# Patient Record
Sex: Female | Born: 1940 | Race: White | Hispanic: No | Smoking: Never smoker
Health system: Southern US, Community
[De-identification: ages and names within clinical notes are randomized; demographics above are authoritative.]

## PROBLEM LIST (undated history)

## (undated) DIAGNOSIS — L509 Urticaria, unspecified: Secondary | ICD-10-CM

## (undated) DIAGNOSIS — I10 Essential (primary) hypertension: Secondary | ICD-10-CM

## (undated) DIAGNOSIS — R350 Frequency of micturition: Secondary | ICD-10-CM

## (undated) DIAGNOSIS — G629 Polyneuropathy, unspecified: Secondary | ICD-10-CM

## (undated) DIAGNOSIS — E785 Hyperlipidemia, unspecified: Secondary | ICD-10-CM

## (undated) DIAGNOSIS — K56609 Unspecified intestinal obstruction, unspecified as to partial versus complete obstruction: Secondary | ICD-10-CM

## (undated) DIAGNOSIS — R102 Pelvic and perineal pain: Secondary | ICD-10-CM

## (undated) DIAGNOSIS — K219 Gastro-esophageal reflux disease without esophagitis: Secondary | ICD-10-CM

## (undated) DIAGNOSIS — B009 Herpesviral infection, unspecified: Secondary | ICD-10-CM

## (undated) DIAGNOSIS — R300 Dysuria: Secondary | ICD-10-CM

## (undated) DIAGNOSIS — N390 Urinary tract infection, site not specified: Secondary | ICD-10-CM

## (undated) DIAGNOSIS — A6000 Herpesviral infection of urogenital system, unspecified: Secondary | ICD-10-CM

## (undated) DIAGNOSIS — E119 Type 2 diabetes mellitus without complications: Secondary | ICD-10-CM

## (undated) DIAGNOSIS — G5700 Lesion of sciatic nerve, unspecified lower limb: Secondary | ICD-10-CM

## (undated) DIAGNOSIS — R319 Hematuria, unspecified: Secondary | ICD-10-CM

## (undated) DIAGNOSIS — F419 Anxiety disorder, unspecified: Secondary | ICD-10-CM

## (undated) DIAGNOSIS — R10814 Left lower quadrant abdominal tenderness: Secondary | ICD-10-CM

## (undated) HISTORY — PX: ABDOMINAL HYSTERECTOMY: SHX81

## (undated) HISTORY — DX: Herpesviral infection, unspecified: B00.9

## (undated) HISTORY — PX: COLON SURGERY: SHX602

## (undated) HISTORY — DX: Type 2 diabetes mellitus without complications: E11.9

## (undated) HISTORY — DX: Herpesviral infection of urogenital system, unspecified: A60.00

## (undated) HISTORY — DX: Anxiety disorder, unspecified: F41.9

## (undated) HISTORY — DX: Essential (primary) hypertension: I10

## (undated) HISTORY — DX: Gastro-esophageal reflux disease without esophagitis: K21.9

## (undated) HISTORY — DX: Lesion of sciatic nerve, unspecified lower limb: G57.00

## (undated) HISTORY — DX: Left lower quadrant abdominal tenderness: R10.814

## (undated) HISTORY — DX: Hyperlipidemia, unspecified: E78.5

## (undated) HISTORY — DX: Urinary tract infection, site not specified: N39.0

## (undated) HISTORY — PX: OTHER SURGICAL HISTORY: SHX169

## (undated) HISTORY — DX: Urticaria, unspecified: L50.9

## (undated) HISTORY — DX: Dysuria: R30.0

## (undated) HISTORY — DX: Pelvic and perineal pain: R10.2

## (undated) HISTORY — DX: Hematuria, unspecified: R31.9

## (undated) HISTORY — DX: Frequency of micturition: R35.0

---

## 2001-03-19 ENCOUNTER — Encounter: Payer: Self-pay | Admitting: Family Medicine

## 2001-03-19 ENCOUNTER — Other Ambulatory Visit: Admission: RE | Admit: 2001-03-19 | Discharge: 2001-03-19 | Payer: Self-pay | Admitting: Family Medicine

## 2001-03-19 ENCOUNTER — Ambulatory Visit (HOSPITAL_COMMUNITY): Admission: RE | Admit: 2001-03-19 | Discharge: 2001-03-19 | Payer: Self-pay | Admitting: Family Medicine

## 2001-04-02 ENCOUNTER — Encounter: Payer: Self-pay | Admitting: Emergency Medicine

## 2001-04-03 ENCOUNTER — Inpatient Hospital Stay (HOSPITAL_COMMUNITY): Admission: EM | Admit: 2001-04-03 | Discharge: 2001-04-06 | Payer: Self-pay | Admitting: Emergency Medicine

## 2001-04-05 ENCOUNTER — Encounter: Payer: Self-pay | Admitting: Cardiology

## 2001-07-24 ENCOUNTER — Ambulatory Visit (HOSPITAL_COMMUNITY): Admission: RE | Admit: 2001-07-24 | Discharge: 2001-07-24 | Payer: Self-pay | Admitting: Ophthalmology

## 2002-02-19 ENCOUNTER — Ambulatory Visit (HOSPITAL_COMMUNITY): Admission: RE | Admit: 2002-02-19 | Discharge: 2002-02-19 | Payer: Self-pay | Admitting: Family Medicine

## 2002-02-19 ENCOUNTER — Encounter: Payer: Self-pay | Admitting: Family Medicine

## 2002-08-12 ENCOUNTER — Emergency Department (HOSPITAL_COMMUNITY): Admission: EM | Admit: 2002-08-12 | Discharge: 2002-08-13 | Payer: Self-pay | Admitting: *Deleted

## 2002-08-13 ENCOUNTER — Encounter: Payer: Self-pay | Admitting: *Deleted

## 2003-02-26 ENCOUNTER — Ambulatory Visit (HOSPITAL_COMMUNITY): Admission: RE | Admit: 2003-02-26 | Discharge: 2003-02-26 | Payer: Self-pay | Admitting: Family Medicine

## 2003-02-26 ENCOUNTER — Encounter: Payer: Self-pay | Admitting: Family Medicine

## 2003-06-24 ENCOUNTER — Other Ambulatory Visit: Admission: RE | Admit: 2003-06-24 | Discharge: 2003-06-24 | Payer: Self-pay | Admitting: Family Medicine

## 2003-07-01 ENCOUNTER — Ambulatory Visit (HOSPITAL_COMMUNITY): Admission: RE | Admit: 2003-07-01 | Discharge: 2003-07-01 | Payer: Self-pay | Admitting: Family Medicine

## 2003-07-01 ENCOUNTER — Encounter: Payer: Self-pay | Admitting: Family Medicine

## 2004-12-08 ENCOUNTER — Emergency Department (HOSPITAL_COMMUNITY): Admission: EM | Admit: 2004-12-08 | Discharge: 2004-12-08 | Payer: Self-pay | Admitting: Internal Medicine

## 2005-01-27 ENCOUNTER — Ambulatory Visit (HOSPITAL_COMMUNITY): Admission: RE | Admit: 2005-01-27 | Discharge: 2005-01-27 | Payer: Self-pay | Admitting: Family Medicine

## 2005-03-03 ENCOUNTER — Emergency Department (HOSPITAL_COMMUNITY): Admission: EM | Admit: 2005-03-03 | Discharge: 2005-03-03 | Payer: Self-pay | Admitting: Emergency Medicine

## 2005-11-29 ENCOUNTER — Other Ambulatory Visit: Admission: RE | Admit: 2005-11-29 | Discharge: 2005-11-29 | Payer: Self-pay | Admitting: Family Medicine

## 2006-05-23 ENCOUNTER — Ambulatory Visit (HOSPITAL_COMMUNITY): Admission: RE | Admit: 2006-05-23 | Discharge: 2006-05-23 | Payer: Self-pay | Admitting: Ophthalmology

## 2007-03-28 ENCOUNTER — Emergency Department (HOSPITAL_COMMUNITY): Admission: EM | Admit: 2007-03-28 | Discharge: 2007-03-28 | Payer: Self-pay | Admitting: Emergency Medicine

## 2007-03-29 ENCOUNTER — Inpatient Hospital Stay (HOSPITAL_COMMUNITY): Admission: AD | Admit: 2007-03-29 | Discharge: 2007-04-04 | Payer: Self-pay | Admitting: Emergency Medicine

## 2007-04-05 ENCOUNTER — Emergency Department (HOSPITAL_COMMUNITY): Admission: EM | Admit: 2007-04-05 | Discharge: 2007-04-05 | Payer: Self-pay | Admitting: Emergency Medicine

## 2007-07-03 ENCOUNTER — Ambulatory Visit (HOSPITAL_COMMUNITY): Admission: RE | Admit: 2007-07-03 | Discharge: 2007-07-03 | Payer: Self-pay | Admitting: Family Medicine

## 2007-07-29 ENCOUNTER — Inpatient Hospital Stay (HOSPITAL_COMMUNITY): Admission: EM | Admit: 2007-07-29 | Discharge: 2007-08-01 | Payer: Self-pay | Admitting: Emergency Medicine

## 2007-08-09 ENCOUNTER — Encounter: Payer: Self-pay | Admitting: Orthopedic Surgery

## 2007-08-09 ENCOUNTER — Ambulatory Visit: Payer: Self-pay | Admitting: Orthopedic Surgery

## 2008-04-25 ENCOUNTER — Ambulatory Visit: Payer: Self-pay | Admitting: Gastroenterology

## 2008-05-07 ENCOUNTER — Telehealth: Payer: Self-pay | Admitting: Gastroenterology

## 2008-05-09 ENCOUNTER — Ambulatory Visit: Payer: Self-pay | Admitting: Gastroenterology

## 2008-09-29 ENCOUNTER — Emergency Department (HOSPITAL_COMMUNITY): Admission: EM | Admit: 2008-09-29 | Discharge: 2008-09-29 | Payer: Self-pay | Admitting: Emergency Medicine

## 2008-10-14 ENCOUNTER — Ambulatory Visit (HOSPITAL_COMMUNITY): Admission: RE | Admit: 2008-10-14 | Discharge: 2008-10-14 | Payer: Self-pay | Admitting: Family Medicine

## 2010-05-26 ENCOUNTER — Ambulatory Visit: Payer: Self-pay | Admitting: Vascular Surgery

## 2011-01-02 ENCOUNTER — Encounter: Payer: Self-pay | Admitting: Family Medicine

## 2011-04-26 NOTE — Discharge Summary (Signed)
[name], [name] [name]               ACCOUNT NO.:  [id number]   MEDICAL RECORD NO.:  [id number]          PATIENT TYPE:  INP   LOCATION:  A310                          FACILITY:  [facility]   PHYSICIAN:  Dalia Heading, M.D.  DATE OF BIRTH:  1941/10/23   DATE OF ADMISSION:  07/29/2007  DATE OF DISCHARGE:  08/20/2008LH                               DISCHARGE SUMMARY   AGE:  70 years old.   HOSPITAL COURSE:  The patient is a 70 year old white female status post  lysis of adhesions earlier this year who presented with nausea and  vomiting.  CT scan of the abdomen and pelvis revealed a possible area of  adhesive disease in the distal small bowel in the lower abdomen.  She  was admitted to the hospital for nasogastric tube decompression and  intravenous hydration.  Her bowel obstruction subsequently resolved  without surgical intervention.  Her diet was advanced without  difficulty.  On the day of discharge, she stated that her left hip has  been hurting.  She states this has been hurting for years and would like  to see an orthopedic surgeon.  She will be set up to see Dr. Romeo Apple as  an outpatient.  She will follow up with Dr. Franky Macho in a couple of  weeks.   DISCHARGE INSTRUCTIONS:  The patient is to follow up with Dr. Franky Macho in 2 weeks.   DISCHARGE MEDICATIONS:  1. Aspirin 81 mg p.o. daily.  2. Hydrochlorothiazide 25 mg p.o. daily.  3. Lexapro 10 mg p.o. daily.  4. Aciphex 20 mg p.o. daily.  5. Estradiol 2 mg p.o. daily.  6. Vicodin 1-2 tabs p.o. q.4 h p.r.n. pain.   PRINCIPAL DIAGNOSES:  1. Small bowel obstruction, resolved.  2. Adhesive disease.  3. Anxiety/depression.  4. Mild hypertension.   PRINCIPAL PROCEDURE:  None.      Dalia Heading, M.D.  Electronically Signed     MAJ/MEDQ  D:  08/01/2007  T:  08/02/2007  Job:  [id number]

## 2011-04-26 NOTE — H&P (Signed)
NAME:  [name], [name]               ACCOUNT NO.:  [id number]   MEDICAL RECORD NO.:  [id number]          PATIENT TYPE:  INP   LOCATION:  A310                          FACILITY:  [facility]   PHYSICIAN:  Dalia Heading, M.D.  DATE OF BIRTH:  September 11, 1941   DATE OF ADMISSION:  07/29/2007  DATE OF DISCHARGE:  LH                              HISTORY & PHYSICAL   CHIEF COMPLAINT:  Left-sided abdominal pain and nausea.   HISTORY OF PRESENT ILLNESS:  The patient is a 70 year old white female,  status post multiple abdominal surgeries in the past, who recently  underwent a lysis of adhesions due to a bowel obstruction in April 2008,  who presents with recurrent left-sided abdominal pain, nausea, and  bloating.  Her last bowel movement was earlier today.  She says she has  been passing gas.  She has had intermittent left-sided abdominal pain  over the past 24 hours.  A CT scan of the abdomen and pelvis was  performed in the emergency room which revealed a mid-small bowel  transition point, possibly due to adhesive disease, with air and  contrast noted distal to this.   PAST MEDICAL HISTORY:  1. Gastroesophageal reflux disease.  2. Adhesive disease.  3. Depression.  4. Hypertension.   PAST SURGICAL HISTORY:  1. As noted above.  2. Hysterectomy.  3. Cholecystectomy.   CURRENT MEDICATIONS:  Hydrochlorothiazide, Lexapro, AcipHex, estrogen  supplements, baby aspirin.   ALLERGIES:  No known drug allergies.   REVIEW OF SYSTEMS:  The patient denies drinking or smoking.  The patient  denies any recent chest pain, MI, CVA, or diabetes mellitus.  She denies  any bleeding disorders.   Of note was the fact that the patient did have a postoperative wound  infection which was a Staph infection.   PHYSICAL EXAMINATION:  GENERAL:  The patient is a well-developed, well-  nourished white female in no acute distress.  LUNGS:  Clear to auscultation, with equal breath sounds bilaterally.  HEART:  Reveals a  regular rate and rhythm, without S3, S4, or murmurs.  ABDOMEN:  Soft but distended.  It is not tense.  No tenderness or  rigidity is noted.  RECTAL:  Deferred at this time.   MET-7 is remarkable for a potassium of 3.4, BUN 20, creatinine 0.89.  White blood cell count 13.6, hematocrit 40, platelet count 186.  Liver  enzyme tests were within normal limits.   IMPRESSION:  Partial small-bowel obstruction secondary to adhesive  disease.   PLAN:  The patient will be admitted to the hospital for nasogastric tube  decompression and relief of her nausea and vomiting.  A nasogastric tube  has been placed.  Hopefully, this will resolve her partial obstruction.  Ideally, I would like to avoid surgery on her, as she has a difficult  time in the postoperative period healing.      Dalia Heading, M.D.  Electronically Signed     MAJ/MEDQ  D:  07/29/2007  T:  07/30/2007  Job:  [id number]

## 2011-04-26 NOTE — Assessment & Plan Note (Signed)
OFFICE VISIT   Silvey, Brentlee S  DOB:  September 10, 1941                                       05/26/2010  ZOXWR#:[id number]   The patient is a 70 year old female referred by Belva Agee for  evaluation of bilateral lower extremity leg pain.  The patient describes  a burning and tingling sensation from the knees down bilaterally.  She  states it has been going on for 2-3 years.  She checks her glucose at  home intermittently since her husband has diabetes.  She states that she  usually runs between 120 and 130.  She states that the pain in her feet  is worse with walking.  She says sometimes the feet feel swollen but  she looks down and notices that they are not.  She denies alcohol or  tobacco abuse.  She currently is taking Tylenol #3 which helps the pain  somewhat.  She says also she has used Aspercreme in the past with some  relief.  She denies any trauma to the lower extremities.   Chronic medical problems include hypertension, reflux, depression.  These are all currently stable and followed by Belva Agee.  She is  currently taking an aspirin daily.   Past medical history is otherwise fairly unremarkable.   PAST SURGICAL HISTORY:  She had some type of intestinal blockage  repaired.  She had a cholecystectomy.  She had a cervical spine  operation.   FAMILY HISTORY:  Remarkable for her brothers, both who had vascular  disease at age 76 and died by age 60.   SOCIAL HISTORY:  She is married.  She has 3 children.  She is a remote  smoker but has not smoked in greater than 20 years.  She does not  consume alcohol regularly.   Full 12-point review of systems was performed with the patient today.  Please see intake referral form for details regarding this.   PHYSICAL EXAM:  Blood pressure 119/77 in the left arm, temperature is  97.8, heart rate 61 and regular.  HEENT:  Unremarkable.  Neck has 2+  carotid pulses without bruit.  Chest is clear to auscultation.   Heart  exam is regular rate and rhythm without murmur.  Abdomen is obese, soft,  nontender, nondistended, with well-healed midline, paramedian, and right  lower quadrant scars.  Lower extremities have no significant major joint  deformities.  There is no significant edema.  Neurologic exam shows  symmetric upper extremity and lower extremity motor strength which is  5/5.  Sensation is intact in the feet and hands.  Skin has no open  ulcers or rashes.  Lower extremity vascular exam:  She has 1+ femoral  pulses.  She has 1+ dorsalis pedis and posterior tibial pulses  bilaterally.  Feet are pale in color with some dry skin but no open  ulcerations.  There is no significant edema.   She had bilateral ABIs today which were normal bilaterally with  triphasic wave forms.  ABI on the right was 1.23; on the left, it was  1.19.   In summary, the patient has bilateral lower extremity leg pain.  Her  symptoms sound more consistent with neuropathy to me rather than  arterial or venous disease.  I reassured her today and offered her a  trial of Neurontin to see if this would improve her symptoms.  However,  she states that she would prefer to stay with the Tylenol 3 for now.  This could certainly be something that Belva Agee could consider in the  future if she has persistent symptoms.  She does not need further  followup with me as far as her vascular evaluation is concerned.  She  could certainly feel free to follow up with me on an as-needed basis if  she has a new problems.     Janetta Hora. Fields, MD  Electronically Signed   CEF/MEDQ  D:  05/26/2010  T:  05/27/2010  Job:  [id number]   cc:   [facility] Belva Agee, NP

## 2011-04-29 NOTE — Discharge Summary (Signed)
NAME:  [name] [name], [name] [name]               ACCOUNT NO.:  [id number]   MEDICAL RECORD NO.:  [id number]          PATIENT TYPE:  INP   LOCATION:  A327                          FACILITY:  [facility]   PHYSICIAN:  Dalia Heading, M.D.  DATE OF BIRTH:  1941/12/05   DATE OF ADMISSION:  03/29/2007  DATE OF DISCHARGE:  04/23/2008LH                               DISCHARGE SUMMARY   HOSPITAL COURSE SUMMARY:  The patient is a 70 year old white female who  was admitted to the hospital for workup of nausea, vomiting, abdominal  distension.  A CT scan of the abdomen and pelvis revealed a small bowel  obstruction.  She subsequently went to the operating room on Apr 29, 2007 and underwent exploratory laparotomy with lysis of adhesions.  Her  bowel obstruction was secondary to previous abdominal surgeries.  She  tolerated the procedure well.  Postoperative course was for the most  part unremarkable.  Her diet was advanced without difficulty once her  bowel function returned.   The patient is being discharged home on April 04, 2007 in good improving  condition.   DISCHARGE INSTRUCTIONS:  The patient is to follow up with Dr. Franky Macho on April 10, 2007.   DISCHARGE MEDICATIONS:  1. Vicodin 1-2 tablets p.o. q.4 h. p.r.n. pain.  2. Hormone pill 2 mg p.o. daily.  3. Lexapro 10 mg p.o. daily.   PRINCIPAL DIAGNOSES:  1. Small bowel obstruction.  2. Gastroesophageal reflux disease.  3. Depression.  4. Hypertension.   PRINCIPAL PROCEDURES:  Exploratory laparotomy, lysis of adhesions on  March 30, 2007.      Dalia Heading, M.D.  Electronically Signed     MAJ/MEDQ  D:  04/04/2007  T:  04/04/2007  Job:  [id number]

## 2011-04-29 NOTE — H&P (Signed)
NAME:  [name] [name], [name] [name]               ACCOUNT NO.:  [id number]   MEDICAL RECORD NO.:  [id number]          PATIENT TYPE:  OBV   LOCATION:  A327                          FACILITY:  [facility]   PHYSICIAN:  Dalia Heading, M.D.  DATE OF BIRTH:  1941/11/18   DATE OF ADMISSION:  03/29/2007  DATE OF DISCHARGE:  LH                              HISTORY & PHYSICAL   CHIEF COMPLAINT:  Left-sided abdominal pain and nausea.   HISTORY OF PRESENT ILLNESS:  The patient is a 70 year old white female  status post multiple abdominal surgeries in the past, who now presents  with worsening left-sided abdominal pain, nausea and bloating.  She last  had a bowel movement yesterday morning.  She was seen in the emergency  room yesterday evening and was told to follow up with her primary care  physician for her abdominal pain.  An acute abdominal series revealed  slightly prominent nonspecific small bowel loops in the left mid  abdomen.  Since she was sent home she has worsened and now presents to  my office for further evaluation and treatment.   PAST MEDICAL HISTORY:  1. Gastroesophageal reflux disease.  2. Depression.  3. Hypertension.   PAST SURGICAL HISTORY:  1. Hysterectomy.  2. Cholecystectomy.   CURRENT MEDICATIONS:  1. Baby aspirin.  2. Hydrochlorothiazide.  3. Lexapro.  4. Aciphex.  5. Estrogen supplement.   ALLERGIES:  NO KNOWN DRUG ALLERGIES.   SOCIAL HISTORY:  Patient does not drink or smoke.   REVIEW OF SYSTEMS:  Denies any recent chest pain, MI, CVA or diabetes  mellitus.  Denies any bleeding disorders.   PHYSICAL EXAMINATION:  GENERAL:  The patient is a well-developed, well-  nourished white female who is slightly anxious.  HEENT:  Examination reveals no scleral icterus.  LUNGS:  Clear to auscultation with equal breath sounds bilaterally.  HEART:  Reveals regular rate and rhythm without S3, S4 or murmurs.  ABDOMEN:  Soft but distended.  It is not tense.  She is tender diffusely  across the mid portion of her abdomen; no rigidity is noted.  RECTAL:  Examination is deferred at this time.   LABORATORY DATA:  MET7 is within normal limits.  Urinalysis is negative.  White blood cell count is noted to be 15.4, hematocrit is 41 and  platelet count is 192.  Liver profile is within normal limits.   IMPRESSION:  Abdominal pain of unknown etiology, question small bowel  obstruction.   PLAN:  The patient will be admitted to the hospital for pain control and  workup of her abdominal pain.  CT scan of the abdomen and pelvis with  and without contrast has been ordered.  Further management is pending  those results.      Dalia Heading, M.D.  Electronically Signed     MAJ/MEDQ  D:  03/29/2007  T:  03/29/2007  Job:  [id number]

## 2011-04-29 NOTE — Op Note (Signed)
NAME:  [name] [name], [name] [name]               ACCOUNT NO.:  [id number]   MEDICAL RECORD NO.:  [id number]          PATIENT TYPE:  INP   LOCATION:  A327                          FACILITY:  [facility]   PHYSICIAN:  Dalia Heading, M.D.  DATE OF BIRTH:  [birth year]   DATE OF PROCEDURE:  03/30/2007  DATE OF DISCHARGE:                               OPERATIVE REPORT   PREOPERATIVE DIAGNOSIS:  Small-bowel obstruction.   POSTOPERATIVE DIAGNOSIS:  Small-bowel obstruction.   PROCEDURE:  Exploratory laparotomy, lysis of adhesions.   SURGEON:  Dr. Franky Macho.   ANESTHESIA:  General endotracheal.   INDICATIONS:  The patient is a 70 year old white female who presents  with nausea, emesis, and abdominal pain.  CT scan of the abdomen and  pelvis was performed which revealed a small-bowel obstruction in the mid  portion.  This presumably is secondary to adhesive disease as the  patient has had multiple abdominal surgeries in the past.  The risks and  benefits of the procedure including bleeding, infection, recurrence of  the disease, the possibility of bowel resection were fully explained to  the patient, gave informed consent.   PROCEDURE NOTE:  The patient was placed in supine position.  After  induction of general endotracheal anesthesia, the abdomen was prepped  and draped using the usual sterile technique with Betadine.  Surgical  site confirmation was performed.   A right paramedian incision was made as the patient has had multiple  midline incisions in the past.  The peritoneal cavity was entered into  without difficulty.  There were multiple adhesions of small bowel up  alongside the abdominal wall close to the incision and into the pelvis.  These were fully lysed.  There was one segment of bowel that appeared to  be kinked and when this was lysed, the obstruction was seen to be  resolved.  The bowel was then ran from the proximal small intestine to  the terminal ileum.  No critical adhesions were  found at the end of the  procedure.  No abnormal bleeding was noted at the end of the procedure.  The nasogastric tube was noted be in the appropriate position in the  stomach.  The bowel was returned into the abdominal cavity in an orderly  fashion.  The fascia was reapproximated using and 0 PDS running suture.  The subcutaneous layer was irrigated with normal saline.  The  subcutaneous layer was also closed with 2-0 and 3-0 Vicryl interrupted  sutures.  The skin was closed using staples.  Betadine ointment and dry  sterile dressings were applied.   All tech needle counts were correct and the end of the procedure.  The  patient was extubated in the operating room and went back to recovery  room awake and in stable condition.   COMPLICATIONS:  None.   SPECIMEN:  None.   BLOOD LOSS:  Minimal.      Dalia Heading, M.D.  Electronically Signed     MAJ/MEDQ  D:  03/30/2007  T:  03/30/2007  Job:  [id number]

## 2011-09-12 LAB — CBC
Hemoglobin: 13.7
Platelets: 161
RDW: 12.9
WBC: 11.9 — ABNORMAL HIGH

## 2011-09-12 LAB — POCT I-STAT, CHEM 8
BUN: 18
Chloride: 97
Sodium: 136

## 2011-09-12 LAB — DIFFERENTIAL
Basophils Absolute: 0
Lymphocytes Relative: 22
Monocytes Absolute: 0.6
Neutro Abs: 8.4 — ABNORMAL HIGH

## 2011-09-12 LAB — D-DIMER, QUANTITATIVE: D-Dimer, Quant: 0.52 — ABNORMAL HIGH

## 2011-09-12 LAB — POCT CARDIAC MARKERS
CKMB, poc: <1 — ABNORMAL LOW
Myoglobin, poc: 47.3
Myoglobin, poc: 48.2

## 2011-09-23 LAB — URINALYSIS, ROUTINE W REFLEX MICROSCOPIC
Bilirubin Urine: NEGATIVE
Nitrite: NEGATIVE
Specific Gravity, Urine: >1.03 — ABNORMAL HIGH
pH: 5.5

## 2011-09-23 LAB — DIFFERENTIAL
Basophils Relative: 1
Eosinophils Absolute: 0.2
Eosinophils Relative: 2
Lymphocytes Relative: 15
Lymphs Abs: 1.9
Lymphs Abs: 2
Neutro Abs: 9.2 — ABNORMAL HIGH
Neutrophils Relative %: 74

## 2011-09-23 LAB — COMPREHENSIVE METABOLIC PANEL
AST: 22
Albumin: 3.5
Calcium: 9
Creatinine, Ser: 0.89
GFR calc Af Amer: >60
GFR calc non Af Amer: >60

## 2011-09-23 LAB — CBC
MCHC: 34.1
MCV: 86.4
MCV: 87.5
Platelets: 164
Platelets: 186
WBC: 12.4 — ABNORMAL HIGH

## 2011-09-23 LAB — BASIC METABOLIC PANEL
BUN: 8
Calcium: 8.5
Chloride: 105
Creatinine, Ser: 0.65
GFR calc Af Amer: >60

## 2011-09-23 LAB — LIPASE, BLOOD: Lipase: 27

## 2014-07-22 ENCOUNTER — Encounter: Payer: Self-pay | Admitting: Gastroenterology

## 2015-12-22 ENCOUNTER — Encounter: Payer: Self-pay | Admitting: Adult Health

## 2015-12-22 ENCOUNTER — Ambulatory Visit (INDEPENDENT_AMBULATORY_CARE_PROVIDER_SITE_OTHER): Payer: Medicare HMO | Admitting: Adult Health

## 2015-12-22 VITALS — BP 132/80 | HR 60 | Ht 58.5 in | Wt 184.0 lb

## 2015-12-22 DIAGNOSIS — B009 Herpesviral infection, unspecified: Secondary | ICD-10-CM | POA: Diagnosis not present

## 2015-12-22 HISTORY — DX: Herpesviral infection, unspecified: B00.9

## 2015-12-22 MED ORDER — VALACYCLOVIR HCL 1 G PO TABS: ORAL_TABLET | ORAL | Status: DC

## 2015-12-22 NOTE — Progress Notes (Signed)
Subjective:     Patient ID: [name] [name], female   DOB: Nov 01, 1941, 75 y.o.   MRN: [id number]  HPI [name] [name] is a [age] yea old white female in complaining of spot on buttock for about 2 -3 weeks itches and burns, has history of herpes in past, daughter is with her.She has moved in with daughter and [place] [facility] [place] [facility], has seen Dr Clelia CroftShaw and Dr Mora ApplMcLeod in past. Has to take miralax for bowels.  Review of Systems Patient denies any headaches, hearing loss, fatigue, blurred vision, shortness of breath, chest pain, abdominal pain, problems with  urination, or intercourse(not having sex). No joint pain or mood swings.See HPI for positives. Reviewed past medical,surgical, social and family history. Reviewed medications and allergies.     Objective:   Physical Exam BP 132/80 mmHg  Pulse 60  Ht 4' 10.5" (1.486 m)  Wt 184 lb (83.462 kg)  BMI 37.80 kg/m2   Has 1.5 x 1.5 cm area inner right buttock, that looks like ruptured vesicles, and has redness between buttocks like diaper rash.  Assessment:     Herpes     Plan:     Rx valtrex 1 gm #30 take 1 bid x 5 days then 1 daily with 11 refills Use desitin and vaseline in between buttocks Follow up in 2 weeks to make sure is healing

## 2015-12-22 NOTE — Patient Instructions (Signed)
Take valtrex  Use desitin and vaseline Follow up in 2 weeks

## 2016-01-04 ENCOUNTER — Ambulatory Visit (INDEPENDENT_AMBULATORY_CARE_PROVIDER_SITE_OTHER): Payer: Medicare HMO | Admitting: Adult Health

## 2016-01-04 ENCOUNTER — Encounter: Payer: Self-pay | Admitting: Adult Health

## 2016-01-04 VITALS — BP 140/80 | HR 74 | Ht 58.5 in | Wt 187.0 lb

## 2016-01-04 DIAGNOSIS — R10814 Left lower quadrant abdominal tenderness: Secondary | ICD-10-CM

## 2016-01-04 DIAGNOSIS — N9489 Other specified conditions associated with female genital organs and menstrual cycle: Secondary | ICD-10-CM | POA: Diagnosis not present

## 2016-01-04 DIAGNOSIS — R319 Hematuria, unspecified: Secondary | ICD-10-CM | POA: Diagnosis not present

## 2016-01-04 DIAGNOSIS — R300 Dysuria: Secondary | ICD-10-CM

## 2016-01-04 DIAGNOSIS — B009 Herpesviral infection, unspecified: Secondary | ICD-10-CM

## 2016-01-04 DIAGNOSIS — R350 Frequency of micturition: Secondary | ICD-10-CM | POA: Diagnosis not present

## 2016-01-04 DIAGNOSIS — Z1389 Encounter for screening for other disorder: Secondary | ICD-10-CM | POA: Diagnosis not present

## 2016-01-04 DIAGNOSIS — R102 Pelvic and perineal pain: Secondary | ICD-10-CM | POA: Insufficient documentation

## 2016-01-04 HISTORY — DX: Pelvic and perineal pain: R10.2

## 2016-01-04 HISTORY — DX: Dysuria: R30.0

## 2016-01-04 HISTORY — DX: Left lower quadrant abdominal tenderness: R10.814

## 2016-01-04 HISTORY — DX: Hematuria, unspecified: R31.9

## 2016-01-04 HISTORY — DX: Frequency of micturition: R35.0

## 2016-01-04 LAB — POCT URINALYSIS DIPSTICK
Leukocytes, UA: NEGATIVE
Nitrite, UA: NEGATIVE
Protein, UA: NEGATIVE

## 2016-01-04 NOTE — Patient Instructions (Addendum)
Return in 7 days for [place] Will send Urine for culture

## 2016-01-04 NOTE — Progress Notes (Signed)
Subjective:     Patient ID: Scharlene Corn, female   DOB: [birth year], 75 y.o.   MRN: [id number]  HPI Daniqua is a 75 year old white female, in for follow up of recent herpes on buttock.Complains of urinary frequency and burning at times and has pressure in bottom of stomach and some hot flashes.  Review of Systems Patient denies any headaches, hearing loss, fatigue, blurred vision, shortness of breath, chest pain,  problems with bowel movements, or intercourse(not having sex). No joint pain or mood swings.See HPI for positives. Reviewed past medical,surgical, social and family history. Reviewed medications and allergies.     Objective:   Physical Exam BP 140/80 mmHg  Pulse 74  Ht 4' 10.5" (1.486 m)  Wt 187 lb (84.823 kg)  BMI 38.41 kg/m2urine trace blood and 1+ glucose, Skin warm and dry.Pelvic: external genitalia is normal in appearance no lesions, vagina: atrophic,urethra has no lesions or masses noted, cervix and uterus are absent. adnexa: no masses or tenderness noted. Bladder is non tender and no masses felt.Redness and raw spot in buttocks resolved.Would not start her on ET, some hot flashes maybe from meds.Will get culture on urine and [place].Continue current meds.   Spoke with son and sent note to daughter.  Assessment:    Urinary frequency Burning with urination Herpes resolved Pelvic pressure LLQ tenderness Hematuria     Plan:     UA C&S  Return in 1 week for gyn [place] Will talk when urine back

## 2016-01-05 ENCOUNTER — Ambulatory Visit: Payer: Medicare HMO | Admitting: Adult Health

## 2016-01-05 LAB — URINALYSIS, ROUTINE W REFLEX MICROSCOPIC
BILIRUBIN UA: NEGATIVE
KETONES UA: NEGATIVE
LEUKOCYTES UA: NEGATIVE
NITRITE UA: NEGATIVE
Protein, UA: NEGATIVE
RBC UA: NEGATIVE
SPEC GRAV UA: 1.016 (ref 1.005–1.030)
Urobilinogen, Ur: 0.2 mg/dL (ref 0.2–1.0)
pH, UA: 5.5 (ref 5.0–7.5)

## 2016-01-06 ENCOUNTER — Telehealth: Payer: Self-pay | Admitting: Adult Health

## 2016-01-06 LAB — URINE CULTURE

## 2016-01-06 NOTE — Telephone Encounter (Signed)
Number disconnected.

## 2016-01-11 ENCOUNTER — Ambulatory Visit (INDEPENDENT_AMBULATORY_CARE_PROVIDER_SITE_OTHER): Payer: Medicare HMO

## 2016-01-11 DIAGNOSIS — N9489 Other specified conditions associated with female genital organs and menstrual cycle: Secondary | ICD-10-CM

## 2016-01-11 DIAGNOSIS — R102 Pelvic and perineal pain: Secondary | ICD-10-CM

## 2016-01-11 DIAGNOSIS — R10814 Left lower quadrant abdominal tenderness: Secondary | ICD-10-CM

## 2016-01-11 NOTE — Progress Notes (Signed)
PELVIC US TA/TV: normal vag cuff,no adnexal masses or free fluid seen,no pain during ultrasound

## 2016-01-13 ENCOUNTER — Telehealth: Payer: Self-pay | Admitting: Adult Health

## 2016-01-13 NOTE — Telephone Encounter (Signed)
Left message to call about US  

## 2016-01-15 ENCOUNTER — Telehealth: Payer: Self-pay | Admitting: Adult Health

## 2016-01-15 NOTE — Telephone Encounter (Signed)
Left message to call back  

## 2016-01-15 NOTE — Telephone Encounter (Signed)
Patty aware [place] normal, [place] is getting hip checked now

## 2016-01-18 ENCOUNTER — Other Ambulatory Visit (HOSPITAL_COMMUNITY): Payer: Self-pay | Admitting: Internal Medicine

## 2016-01-18 ENCOUNTER — Ambulatory Visit (HOSPITAL_COMMUNITY)
Admission: RE | Admit: 2016-01-18 | Discharge: 2016-01-18 | Disposition: A | Discharge status: Home / Self Care | Payer: Medicare HMO | Source: Ambulatory Visit | Attending: Internal Medicine | Admitting: Internal Medicine

## 2016-01-18 DIAGNOSIS — E119 Type 2 diabetes mellitus without complications: Secondary | ICD-10-CM | POA: Insufficient documentation

## 2016-01-18 DIAGNOSIS — R102 Pelvic and perineal pain: Secondary | ICD-10-CM

## 2016-01-18 DIAGNOSIS — M25552 Pain in left hip: Secondary | ICD-10-CM | POA: Diagnosis not present

## 2016-01-18 DIAGNOSIS — I10 Essential (primary) hypertension: Secondary | ICD-10-CM | POA: Insufficient documentation

## 2016-02-01 ENCOUNTER — Ambulatory Visit (HOSPITAL_COMMUNITY)
Admission: RE | Admit: 2016-02-01 | Discharge: 2016-02-01 | Disposition: A | Discharge status: Home / Self Care | Payer: Medicare HMO | Source: Ambulatory Visit | Attending: Internal Medicine | Admitting: Internal Medicine

## 2016-02-01 ENCOUNTER — Other Ambulatory Visit (HOSPITAL_COMMUNITY): Payer: Self-pay | Admitting: Internal Medicine

## 2016-02-01 DIAGNOSIS — Z833 Family history of diabetes mellitus: Secondary | ICD-10-CM

## 2016-02-01 DIAGNOSIS — J101 Influenza due to other identified influenza virus with other respiratory manifestations: Principal | ICD-10-CM | POA: Diagnosis present

## 2016-02-01 DIAGNOSIS — I119 Hypertensive heart disease without heart failure: Secondary | ICD-10-CM | POA: Diagnosis present

## 2016-02-01 DIAGNOSIS — Z6836 Body mass index (BMI) 36.0-36.9, adult: Secondary | ICD-10-CM

## 2016-02-01 DIAGNOSIS — G9340 Encephalopathy, unspecified: Secondary | ICD-10-CM | POA: Diagnosis present

## 2016-02-01 DIAGNOSIS — Z87440 Personal history of urinary (tract) infections: Secondary | ICD-10-CM

## 2016-02-01 DIAGNOSIS — R059 Cough, unspecified: Secondary | ICD-10-CM

## 2016-02-01 DIAGNOSIS — M47814 Spondylosis without myelopathy or radiculopathy, thoracic region: Secondary | ICD-10-CM

## 2016-02-01 DIAGNOSIS — F17290 Nicotine dependence, other tobacco product, uncomplicated: Secondary | ICD-10-CM | POA: Diagnosis present

## 2016-02-01 DIAGNOSIS — E785 Hyperlipidemia, unspecified: Secondary | ICD-10-CM | POA: Diagnosis present

## 2016-02-01 DIAGNOSIS — E860 Dehydration: Secondary | ICD-10-CM | POA: Diagnosis present

## 2016-02-01 DIAGNOSIS — Z818 Family history of other mental and behavioral disorders: Secondary | ICD-10-CM

## 2016-02-01 DIAGNOSIS — E119 Type 2 diabetes mellitus without complications: Secondary | ICD-10-CM | POA: Diagnosis present

## 2016-02-01 DIAGNOSIS — Z820 Family history of epilepsy and other diseases of the nervous system: Secondary | ICD-10-CM

## 2016-02-01 DIAGNOSIS — R05 Cough: Secondary | ICD-10-CM

## 2016-02-01 DIAGNOSIS — M47816 Spondylosis without myelopathy or radiculopathy, lumbar region: Secondary | ICD-10-CM | POA: Insufficient documentation

## 2016-02-01 DIAGNOSIS — A084 Viral intestinal infection, unspecified: Secondary | ICD-10-CM | POA: Diagnosis present

## 2016-02-01 DIAGNOSIS — Z8249 Family history of ischemic heart disease and other diseases of the circulatory system: Secondary | ICD-10-CM

## 2016-02-01 DIAGNOSIS — E669 Obesity, unspecified: Secondary | ICD-10-CM | POA: Diagnosis present

## 2016-02-01 DIAGNOSIS — J209 Acute bronchitis, unspecified: Secondary | ICD-10-CM | POA: Diagnosis present

## 2016-02-01 DIAGNOSIS — R531 Weakness: Secondary | ICD-10-CM | POA: Diagnosis not present

## 2016-02-01 DIAGNOSIS — K219 Gastro-esophageal reflux disease without esophagitis: Secondary | ICD-10-CM | POA: Diagnosis present

## 2016-02-03 ENCOUNTER — Encounter (HOSPITAL_COMMUNITY): Payer: Self-pay | Admitting: Emergency Medicine

## 2016-02-03 ENCOUNTER — Emergency Department (HOSPITAL_COMMUNITY): Payer: Medicare HMO

## 2016-02-03 ENCOUNTER — Inpatient Hospital Stay (HOSPITAL_COMMUNITY)
Admission: EM | Admit: 2016-02-03 | Discharge: 2016-02-05 | DRG: 193 | Disposition: A | Discharge status: Home Health | Payer: Medicare HMO | Attending: Internal Medicine | Admitting: Internal Medicine

## 2016-02-03 DIAGNOSIS — J101 Influenza due to other identified influenza virus with other respiratory manifestations: Secondary | ICD-10-CM | POA: Diagnosis present

## 2016-02-03 DIAGNOSIS — Z8249 Family history of ischemic heart disease and other diseases of the circulatory system: Secondary | ICD-10-CM | POA: Diagnosis not present

## 2016-02-03 DIAGNOSIS — E119 Type 2 diabetes mellitus without complications: Secondary | ICD-10-CM | POA: Diagnosis present

## 2016-02-03 DIAGNOSIS — G9340 Encephalopathy, unspecified: Secondary | ICD-10-CM | POA: Diagnosis present

## 2016-02-03 DIAGNOSIS — K219 Gastro-esophageal reflux disease without esophagitis: Secondary | ICD-10-CM | POA: Diagnosis present

## 2016-02-03 DIAGNOSIS — B349 Viral infection, unspecified: Secondary | ICD-10-CM

## 2016-02-03 DIAGNOSIS — E860 Dehydration: Secondary | ICD-10-CM | POA: Diagnosis present

## 2016-02-03 DIAGNOSIS — J208 Acute bronchitis due to other specified organisms: Secondary | ICD-10-CM | POA: Diagnosis not present

## 2016-02-03 DIAGNOSIS — R531 Weakness: Secondary | ICD-10-CM

## 2016-02-03 DIAGNOSIS — Z818 Family history of other mental and behavioral disorders: Secondary | ICD-10-CM | POA: Diagnosis not present

## 2016-02-03 DIAGNOSIS — F17290 Nicotine dependence, other tobacco product, uncomplicated: Secondary | ICD-10-CM | POA: Diagnosis present

## 2016-02-03 DIAGNOSIS — Z87440 Personal history of urinary (tract) infections: Secondary | ICD-10-CM | POA: Diagnosis not present

## 2016-02-03 DIAGNOSIS — E1159 Type 2 diabetes mellitus with other circulatory complications: Secondary | ICD-10-CM

## 2016-02-03 DIAGNOSIS — E785 Hyperlipidemia, unspecified: Secondary | ICD-10-CM | POA: Diagnosis present

## 2016-02-03 DIAGNOSIS — I119 Hypertensive heart disease without heart failure: Secondary | ICD-10-CM | POA: Diagnosis present

## 2016-02-03 DIAGNOSIS — J209 Acute bronchitis, unspecified: Secondary | ICD-10-CM

## 2016-02-03 DIAGNOSIS — Z820 Family history of epilepsy and other diseases of the nervous system: Secondary | ICD-10-CM | POA: Diagnosis not present

## 2016-02-03 DIAGNOSIS — E669 Obesity, unspecified: Secondary | ICD-10-CM | POA: Diagnosis present

## 2016-02-03 DIAGNOSIS — A084 Viral intestinal infection, unspecified: Secondary | ICD-10-CM | POA: Diagnosis present

## 2016-02-03 DIAGNOSIS — Z6836 Body mass index (BMI) 36.0-36.9, adult: Secondary | ICD-10-CM | POA: Diagnosis not present

## 2016-02-03 DIAGNOSIS — I10 Essential (primary) hypertension: Secondary | ICD-10-CM | POA: Diagnosis not present

## 2016-02-03 DIAGNOSIS — Z833 Family history of diabetes mellitus: Secondary | ICD-10-CM | POA: Diagnosis not present

## 2016-02-03 LAB — BASIC METABOLIC PANEL
Anion gap: 10 (ref 5–15)
BUN: 20 mg/dL (ref 6–20)
CHLORIDE: 100 mmol/L — AB (ref 101–111)
CO2: 27 mmol/L (ref 22–32)
CREATININE: 1.22 mg/dL — AB (ref 0.44–1.00)
Calcium: 9.2 mg/dL (ref 8.9–10.3)
GFR calc Af Amer: 49 mL/min — ABNORMAL LOW (ref >60)
GFR calc non Af Amer: 43 mL/min — ABNORMAL LOW (ref >60)
Glucose, Bld: 164 mg/dL — ABNORMAL HIGH (ref 65–99)
Potassium: 4.3 mmol/L (ref 3.5–5.1)
Sodium: 137 mmol/L (ref 135–145)

## 2016-02-03 LAB — HEPATIC FUNCTION PANEL
ALBUMIN: 3.5 g/dL (ref 3.5–5.0)
ALT: 19 U/L (ref 14–54)
AST: 24 U/L (ref 15–41)
Alkaline Phosphatase: 45 U/L (ref 38–126)
Bilirubin, Direct: 0.1 mg/dL (ref 0.1–0.5)
Indirect Bilirubin: 0.2 mg/dL — ABNORMAL LOW (ref 0.3–0.9)
TOTAL PROTEIN: 6.5 g/dL (ref 6.5–8.1)
Total Bilirubin: 0.3 mg/dL (ref 0.3–1.2)

## 2016-02-03 LAB — CBC WITH DIFFERENTIAL/PLATELET
Basophils Absolute: 0.1 10*3/uL (ref 0.0–0.1)
Basophils Relative: 1 %
EOS ABS: 0.1 10*3/uL (ref 0.0–0.7)
Eosinophils Relative: 1 %
HEMATOCRIT: 43.9 % (ref 36.0–46.0)
HEMOGLOBIN: 14.4 g/dL (ref 12.0–15.0)
LYMPHS ABS: 0.5 10*3/uL — AB (ref 0.7–4.0)
Lymphocytes Relative: 4 %
MCH: 31 pg (ref 26.0–34.0)
MCHC: 32.8 g/dL (ref 30.0–36.0)
MCV: 94.4 fL (ref 78.0–100.0)
MONOS PCT: 15 %
Monocytes Absolute: 1.9 10*3/uL — ABNORMAL HIGH (ref 0.1–1.0)
NEUTROS ABS: 9.8 10*3/uL — AB (ref 1.7–7.7)
NEUTROS PCT: 79 %
Platelets: 159 10*3/uL (ref 150–400)
RBC: 4.65 MIL/uL (ref 3.87–5.11)
RDW: 14.5 % (ref 11.5–15.5)
WBC: 12.3 10*3/uL — ABNORMAL HIGH (ref 4.0–10.5)

## 2016-02-03 LAB — URINALYSIS, ROUTINE W REFLEX MICROSCOPIC
Bilirubin Urine: NEGATIVE
GLUCOSE, UA: NEGATIVE mg/dL
Ketones, ur: NEGATIVE mg/dL
Nitrite: NEGATIVE
PH: 5 (ref 5.0–8.0)
Protein, ur: NEGATIVE mg/dL
Specific Gravity, Urine: >1.03 — ABNORMAL HIGH (ref 1.005–1.030)

## 2016-02-03 LAB — GLUCOSE, CAPILLARY: GLUCOSE-CAPILLARY: 102 mg/dL — AB (ref 65–99)

## 2016-02-03 LAB — INFLUENZA PANEL BY PCR (TYPE A & B)
H1N1FLUPCR: NOT DETECTED
INFLAPCR: POSITIVE — AB
INFLBPCR: NEGATIVE

## 2016-02-03 LAB — CBG MONITORING, ED: Glucose-Capillary: 163 mg/dL — ABNORMAL HIGH (ref 65–99)

## 2016-02-03 LAB — LACTIC ACID, PLASMA
Lactic Acid, Venous: 2.1 mmol/L (ref 0.5–2.0)
Lactic Acid, Venous: 1.9 mmol/L (ref 0.5–2.0)

## 2016-02-03 LAB — URINE MICROSCOPIC-ADD ON

## 2016-02-03 LAB — TROPONIN I: Troponin I: <0.03 ng/mL (ref <0.031)

## 2016-02-03 LAB — MRSA PCR SCREENING: MRSA BY PCR: POSITIVE — AB

## 2016-02-03 MED ORDER — CITALOPRAM HYDROBROMIDE 20 MG PO TABS
40.0000 mg | ORAL_TABLET | Freq: Every day | ORAL | Status: DC
  Administered 2016-02-03 – 2016-02-05 (×2): 40 mg via ORAL
  Filled 2016-02-03 (×3): qty 2

## 2016-02-03 MED ORDER — ENOXAPARIN SODIUM 40 MG/0.4ML ~~LOC~~ SOLN
40.0000 mg | SUBCUTANEOUS | Status: DC
  Administered 2016-02-03 – 2016-02-04 (×2): 40 mg via SUBCUTANEOUS
  Filled 2016-02-03 (×2): qty 0.4

## 2016-02-03 MED ORDER — ACETAMINOPHEN 650 MG RE SUPP: 650.0000 mg | Freq: Four times a day (QID) | RECTAL | Status: DC | PRN

## 2016-02-03 MED ORDER — OSELTAMIVIR PHOSPHATE 75 MG PO CAPS: 75.0000 mg | ORAL_CAPSULE | Freq: Two times a day (BID) | ORAL | Status: DC

## 2016-02-03 MED ORDER — SODIUM CHLORIDE 0.9 % IV SOLN
INTRAVENOUS | Status: AC
  Filled 2016-02-03: qty 1000

## 2016-02-03 MED ORDER — CHLORHEXIDINE GLUCONATE CLOTH 2 % EX PADS
6.0000 | MEDICATED_PAD | Freq: Every day | CUTANEOUS | Status: DC
  Administered 2016-02-05: 6 via TOPICAL

## 2016-02-03 MED ORDER — INSULIN ASPART 100 UNIT/ML ~~LOC~~ SOLN: 0.0000 [IU] | Freq: Three times a day (TID) | SUBCUTANEOUS | Status: DC

## 2016-02-03 MED ORDER — VALACYCLOVIR HCL 500 MG PO TABS
500.0000 mg | ORAL_TABLET | Freq: Every day | ORAL | Status: DC
  Administered 2016-02-03 – 2016-02-05 (×3): 500 mg via ORAL
  Filled 2016-02-03 (×6): qty 1

## 2016-02-03 MED ORDER — SIMVASTATIN 20 MG PO TABS
40.0000 mg | ORAL_TABLET | Freq: Every day | ORAL | Status: DC
  Administered 2016-02-03 – 2016-02-05 (×3): 40 mg via ORAL
  Filled 2016-02-03 (×3): qty 2

## 2016-02-03 MED ORDER — ONDANSETRON HCL 4 MG/2ML IJ SOLN
4.0000 mg | Freq: Once | INTRAMUSCULAR | Status: AC
  Administered 2016-02-03: 4 mg via INTRAVENOUS
  Filled 2016-02-03: qty 2

## 2016-02-03 MED ORDER — MUPIROCIN 2 % EX OINT
1.0000 "application " | TOPICAL_OINTMENT | Freq: Two times a day (BID) | CUTANEOUS | Status: DC
  Administered 2016-02-04 – 2016-02-05 (×4): 1 via NASAL
  Filled 2016-02-03 (×2): qty 22

## 2016-02-03 MED ORDER — ONDANSETRON HCL 4 MG PO TABS: 4.0000 mg | ORAL_TABLET | Freq: Four times a day (QID) | ORAL | Status: DC | PRN

## 2016-02-03 MED ORDER — SODIUM CHLORIDE 0.9 % IV BOLUS (SEPSIS)
1000.0000 mL | Freq: Once | INTRAVENOUS | Status: AC
  Administered 2016-02-03: 1000 mL via INTRAVENOUS

## 2016-02-03 MED ORDER — SODIUM CHLORIDE 0.9% FLUSH
3.0000 mL | Freq: Two times a day (BID) | INTRAVENOUS | Status: DC
  Administered 2016-02-04: 3 mL via INTRAVENOUS

## 2016-02-03 MED ORDER — OSELTAMIVIR PHOSPHATE 30 MG PO CAPS
30.0000 mg | ORAL_CAPSULE | Freq: Two times a day (BID) | ORAL | Status: DC
  Administered 2016-02-04 – 2016-02-05 (×3): 30 mg via ORAL
  Filled 2016-02-03 (×9): qty 1

## 2016-02-03 MED ORDER — ACETAMINOPHEN 325 MG PO TABS
650.0000 mg | ORAL_TABLET | Freq: Four times a day (QID) | ORAL | Status: DC | PRN
  Administered 2016-02-03 – 2016-02-04 (×3): 650 mg via ORAL
  Filled 2016-02-03 (×3): qty 2

## 2016-02-03 MED ORDER — SODIUM CHLORIDE 0.9 % IV SOLN
INTRAVENOUS | Status: DC
  Administered 2016-02-04 (×3): via INTRAVENOUS

## 2016-02-03 MED ORDER — ALBUTEROL SULFATE (2.5 MG/3ML) 0.083% IN NEBU: 2.5000 mg | INHALATION_SOLUTION | RESPIRATORY_TRACT | Status: DC | PRN

## 2016-02-03 MED ORDER — ONDANSETRON HCL 4 MG/2ML IJ SOLN: 4.0000 mg | Freq: Four times a day (QID) | INTRAMUSCULAR | Status: DC | PRN

## 2016-02-03 MED ORDER — DEXTROSE 5 % IV SOLN
1.0000 g | Freq: Once | INTRAVENOUS | Status: AC
  Administered 2016-02-03: 1 g via INTRAVENOUS
  Filled 2016-02-03: qty 10

## 2016-02-03 MED ORDER — GABAPENTIN 300 MG PO CAPS
300.0000 mg | ORAL_CAPSULE | Freq: Two times a day (BID) | ORAL | Status: DC
  Administered 2016-02-03 – 2016-02-05 (×2): 300 mg via ORAL
  Filled 2016-02-03 (×3): qty 1

## 2016-02-03 MED ORDER — PANTOPRAZOLE SODIUM 40 MG PO TBEC
80.0000 mg | DELAYED_RELEASE_TABLET | Freq: Every day | ORAL | Status: DC
  Administered 2016-02-03 – 2016-02-05 (×3): 80 mg via ORAL
  Filled 2016-02-03 (×3): qty 2

## 2016-02-03 NOTE — ED Provider Notes (Addendum)
CSN: [id number]     Arrival date & time 02/03/16  1019 History  By signing my name below, I, Emmanuella Mensah, attest that this documentation has been prepared under the direction and in the presence of Donnetta Hutching, MD. Electronically Signed: Angelene Giovanni, ED Scribe. 02/03/2016. 12:01 PM.   Chief Complaint  Patient presents with  . Emesis   The history is provided by the patient. No language interpreter was used.   HPI Comments: [name] is a 75 y.o. female with a hx of HTN, DM who presents to the Emergency Department complaining of gradually worsening persistent cough onset 3 days ago. Per daughter, pt has associated vomiting, congestion, and difficulty eating and drinking appropriately. Pt received a a Z-pak 3 days ago from Dr. Karilyn Cota but her symptoms are progressing. No diarrhea. She is normally very active, but she has been debilitated last few days. She has trouble walking. Daughter reports she is confused.  PCP: Dr. Karilyn Cota   Past Medical History  Diagnosis Date  . Genital herpes   . GERD (gastroesophageal reflux disease)   . Hyperlipidemia   . Hypertension   . Diabetes mellitus without complication (HCC)   . Anxiety   . Recurrent UTI   . Herpes 12/22/2015  . Urinary frequency 01/04/2016  . Burning with urination 01/04/2016  . Pelvic pressure in female 01/04/2016  . Hematuria 01/04/2016  . LLQ abdominal tenderness 01/04/2016   Past Surgical History  Procedure Laterality Date  . Abdominal hysterectomy    . Colon surgery      blocked colon   Family History  Problem Relation Age of Onset  . Diabetes Mother   . Heart disease Brother   . Hypertension Brother   . Diabetes Brother   . Hypertension Daughter   . Diabetes Brother   . Diabetes Brother   . Alzheimer's disease Brother   . Anxiety disorder Daughter    Social History  Substance Use Topics  . Smoking status: Never Smoker   . Smokeless tobacco: Current User    Types: Snuff  . Alcohol Use: No   OB  History    Gravida Para Term Preterm AB TAB SAB Ectopic Multiple Living   Review of Systems  Constitutional: Positive for fever.  Respiratory: Positive for cough.   Gastrointestinal: Positive for nausea and vomiting. Negative for diarrhea.    A complete 10 system review of systems was obtained and all systems are negative except as noted in the HPI and PMH.    Allergies  Penicillins and Sulfa antibiotics  Home Medications   Prior to Admission medications   Medication Sig Start Date End Date Taking? Authorizing Provider  azithromycin (ZITHROMAX) 250 MG tablet Take 250 mg by mouth daily.   Yes Historical Provider, MD  citalopram (CELEXA) 40 MG tablet Take 40 mg by mouth daily.   Yes Historical Provider, MD  lisinopril (PRINIVIL,ZESTRIL) 2.5 MG tablet Take 2.5 mg by mouth daily.   Yes Historical Provider, MD  metFORMIN (GLUCOPHAGE) 500 MG tablet Take 500 mg by mouth as needed.   Yes Historical Provider, MD  omeprazole (PRILOSEC) 40 MG capsule Take 40 mg by mouth daily.   Yes Historical Provider, MD  simvastatin (ZOCOR) 40 MG tablet Take 40 mg by mouth daily.   Yes Historical Provider, MD  gabapentin (NEURONTIN) 300 MG capsule Take 300 mg by mouth 2 (two) times daily. Reported on 02/03/2016  Historical Provider, MD  valACYclovir (VALTREX) 1000 MG tablet Take 1 bid for 5 days then 1 daily 12/22/15   Adline Potter, NP   BP 124/83 mmHg  Pulse 81  Temp(Src) 100 F (37.8 C) (Oral)  Resp 20  Ht 5' (1.524 m)  Wt 185 lb (83.915 kg)  BMI 36.13 kg/m2  SpO2 95% Physical Exam  Constitutional: She is oriented to person, place, and time. She appears well-developed and well-nourished.  Appears dehydrated  HENT:  Head: Normocephalic and atraumatic.  Eyes: Conjunctivae and EOM are normal. Pupils are equal, round, and reactive to light.  Neck: Normal range of motion. Neck supple.  Cardiovascular: Normal rate and regular rhythm.   Pulmonary/Chest: Effort normal and breath  sounds normal.  Abdominal: Soft. Bowel sounds are normal.  Musculoskeletal: Normal range of motion.  She is ambulatory, but unsteadily  Neurological: She is alert and oriented to person, place, and time.  Skin: Skin is warm and dry.  Psychiatric: She has a normal mood and affect. Her behavior is normal.  Nursing note and vitals reviewed.   ED Course  Procedures (including critical care time) DIAGNOSTIC STUDIES: Oxygen Saturation is 98% on RA, normal by my interpretation.    COORDINATION OF CARE: 11:55 AM- Pt advised of plan for treatment and pt agrees. Pt will receive lab work, chest x-ray, and EKG for further evaluation.    Labs Review Labs Reviewed  CBC WITH DIFFERENTIAL/PLATELET - Abnormal; Notable for the following:    WBC 12.3 (*)    Neutro Abs 9.8 (*)    Lymphs Abs 0.5 (*)    Monocytes Absolute 1.9 (*)    All other components within normal limits  BASIC METABOLIC PANEL - Abnormal; Notable for the following:    Chloride 100 (*)    Glucose, Bld 164 (*)    Creatinine, Ser 1.22 (*)    GFR calc non Af Amer 43 (*)    GFR calc Af Amer 49 (*)    All other components within normal limits  URINALYSIS, ROUTINE W REFLEX MICROSCOPIC (NOT AT [facility]) - Abnormal; Notable for the following:    Specific Gravity, Urine >1.030 (*)    Hgb urine dipstick SMALL (*)    Leukocytes, UA SMALL (*)    All other components within normal limits  URINE MICROSCOPIC-ADD ON - Abnormal; Notable for the following:    Squamous Epithelial / LPF 0-5 (*)    Bacteria, UA MANY (*)    All other components within normal limits  CBG MONITORING, ED - Abnormal; Notable for the following:    Glucose-Capillary 163 (*)    All other components within normal limits  TROPONIN I    Imaging Review Dg Chest 2 View  02/03/2016  CLINICAL DATA:  75 year old female with shortness of breath weakness and altered level of consciousness. Flu like symptoms. Initial encounter. EXAM: CHEST  2 VIEW COMPARISON:  02/01/2016 and  earlier. FINDINGS: Upright AP and lateral views of the chest. Lower lung volumes. Stable mild cardiomegaly. Other mediastinal contours are within normal limits. Visualized tracheal air column is within normal limits. Mild respiratory motion artifact. No pneumothorax, pulmonary edema, pleural effusion or definite acute pulmonary opacity. No acute osseous abnormality identified. IMPRESSION: Lower lung volumes, otherwise no acute cardiopulmonary abnormality. Electronically Signed   By: Odessa Fleming M.D.   On: 02/03/2016 12:21   Donnetta Hutching, MD has personally reviewed and evaluated these images and lab results as part of his medical decision-making.   EKG Interpretation None  Date: 02/03/2016  Rate: 95  Rhythm: normal sinus rhythm  QRS Axis: normal  Intervals: normal  ST/T Wave abnormalities: normal  Conduction Disutrbances: PVC and PAT  Narrative Interpretation: unremarkable    MDM   Final diagnoses:  Viral syndrome   Patient is dehydrated. Her specific gravity of urine is greater than 1.030.  Urinalysis shows evidence of infection. 1 g Rocephin IV. Urine culture. Chest x-ray negative for pneumonia. I suspect she has a viral syndrome. She is unable to ambulate independently. Discussed with Dr. Arbutus Leas.  He will evaluate patient.   I personally performed the services described in this documentation, which was scribed in my presence. The recorded information has been reviewed and is accurate.    Donnetta Hutching, MD 02/03/16 1454  Donnetta Hutching, MD 02/03/16 [id number]

## 2016-02-03 NOTE — ED Notes (Signed)
Pt has redness and swelling noted to her left arm directly proximal to IV site. IV infusion of rocephin was completed and disconnect. Area was marked with pen by this nurse.

## 2016-02-03 NOTE — Progress Notes (Signed)
Call received from Lab reporting patient's MRSA PCR is positive. Patient placed on Contact precaution, Protocol initiated.

## 2016-02-03 NOTE — H&P (Signed)
History and Physical  [name] ZOX:[id number] DOB: 1941/09/16 DOA: 02/03/2016   PCP: Wilson Singer, MD  Referring Physician: ED/ Dr. Donnetta Hutching  Chief Complaint: generalized weakness  HPI:  75 year old female with a history of genital herpes, hyperlipidemia, hypertension, chronic lower abdominal pain presented with three-day history of progressive generalized weakness. Approximately 3 days prior to this admission, the patient began developing upper respiratory symptoms which included cough, chest congestion, and some mild dyspnea on exertion. She went to see her primary care provider Karilyn Cota) on 02/01/2016, and she was given azithromycin. However, her clinical condition worsened when she began developing nausea and vomiting on 02/02/2016 resulting in increasing generalized weakness to the point where she was having difficulty getting out of bed. The patient went to see her primary care provider on the day of admission, and was directed to the emergency department. At home, the patient had 4-5 episodes of vomiting prior to coming to the emergency department. In addition, the patient had a fever up to 101.447F on 02/02/2016. She denies any headache, neck pain, rashes, chest pain, shortness breath, diarrhea, hematochezia, dysuria, hematuria. She had a temperature of 100.447F and was hemodynamically stable in the emergency department. She was given 2 L normal saline. Labs revealed a WBC 4.3 with serum creatinine 1.22. Urinalysis showed 6-30 WBCs. The patient was given a dose of empiric ceftriaxone emergency department.  Assessment/Plan: generalized weakness/dehydration  -Likely due to viral gastroenteritis which likely started as upper respiratory type infection.  -The patient is mildly hemoconcentrated and urinalysis showed increased specific gravity -Patient's daughter has similar clinical syndrome approximately 4-5 days prior to the patient's illness -Continue IV fluids  -Check EKG    -Lactic acid  -Blood cultures 2 sets--please note that ceftriaxone was given in the emergency department prior to blood cultures being obtained  -Add urine culture to urinalysis  -Orthostatic vital signs  Acute bronchitis  -Likely viral  -Viral respiratory panel  -Influenza PCR  -Aerosolized albuterol when necessary shortness of breath  -will not start abx unless pt clinical condition worsens -CXR--neg for infiltrates Delirium/Acute encephalopathy -due to infectious process and dehydration Hypertension  -Hold lisinopril due to soft blood pressure  Diabetes mellitus type 2  -Hold metformin  -NovoLog sliding scale  Hyperlipidemia  - Continue statin       Past Medical History  Diagnosis Date  . Genital herpes   . GERD (gastroesophageal reflux disease)   . Hyperlipidemia   . Hypertension   . Diabetes mellitus without complication (HCC)   . Anxiety   . Recurrent UTI   . Herpes 12/22/2015  . Urinary frequency 01/04/2016  . Burning with urination 01/04/2016  . Pelvic pressure in female 01/04/2016  . Hematuria 01/04/2016  . LLQ abdominal tenderness 01/04/2016   Past Surgical History  Procedure Laterality Date  . Abdominal hysterectomy    . Colon surgery      blocked colon   Social History:  reports that she has never smoked. Her smokeless tobacco use includes Snuff. She reports that she does not drink alcohol or use illicit drugs.   Family History  Problem Relation Age of Onset  . Diabetes Mother   . Heart disease Brother   . Hypertension Brother   . Diabetes Brother   . Hypertension Daughter   . Diabetes Brother   . Diabetes Brother   . Alzheimer's disease Brother   . Anxiety disorder Daughter      Allergies  Allergen Reactions  . Penicillins Nausea  Only    Has patient had a PCN reaction causing immediate rash, facial/tongue/throat swelling, SOB or lightheadedness with hypotension: Yes Has patient had a PCN reaction causing severe rash involving mucus  membranes or skin necrosis: No Has patient had a PCN reaction that required hospitalization No Has patient had a PCN reaction occurring within the last 10 years: No If all of the above answers are "NO", then may proceed with Cephalosporin use.   . Sulfa Antibiotics Nausea And Vomiting      Prior to Admission medications   Medication Sig Start Date End Date Taking? Authorizing Provider  azithromycin (ZITHROMAX) 250 MG tablet Take 250 mg by mouth daily.   Yes Historical Provider, MD  citalopram (CELEXA) 40 MG tablet Take 40 mg by mouth daily.   Yes Historical Provider, MD  lisinopril (PRINIVIL,ZESTRIL) 2.5 MG tablet Take 2.5 mg by mouth daily.   Yes Historical Provider, MD  metFORMIN (GLUCOPHAGE) 500 MG tablet Take 500 mg by mouth as needed.   Yes Historical Provider, MD  omeprazole (PRILOSEC) 40 MG capsule Take 40 mg by mouth daily.   Yes Historical Provider, MD  simvastatin (ZOCOR) 40 MG tablet Take 40 mg by mouth daily.   Yes Historical Provider, MD  gabapentin (NEURONTIN) 300 MG capsule Take 300 mg by mouth 2 (two) times daily. Reported on 02/03/2016    Historical Provider, MD  valACYclovir (VALTREX) 1000 MG tablet Take 1 bid for 5 days then 1 daily 12/22/15   Adline Potter, NP    Review of Systems:  Constitutional:  No weight loss, night sweats.  Head&Eyes: No headache.  No vision loss.  No eye pain or scotoma ENT:  No Difficulty swallowing,Tooth/dental problems,Sore throat,  No ear ache, post nasal drip,  Cardio-vascular:  No chest pain, Orthopnea, PND, swelling in lower extremities,  dizziness, palpitations  GI:  No  abdominal pain,diarrhea, loss of appetite, hematochezia, melena, heartburn, indigestion, Resp:  No coughing up of blood .No wheezing.No chest wall deformity  Skin:  no rash or lesions.  GU:  no dysuria, change in color of urine, no urgency or frequency. No flank pain.  Musculoskeletal:  No joint pain or swelling. No decreased range of motion. No back  pain.  Psych:  No change in mood or affect.  Neurologic: No headache, no dysesthesia, no focal weakness, no vision loss. No syncope  Physical Exam: Filed Vitals:   02/03/16 1314 02/03/16 1400 02/03/16 1430 02/03/16 1530  BP: 119/51 108/42 124/83 105/46  Pulse: 82 84 81 78  Temp:      TempSrc:      Resp: Height:      Weight:      SpO2: 98% 93% 95% 97%   General:  A&O x 2, NAD, nontoxic, pleasant/cooperative Head/Eye: No conjunctival hemorrhage, no icterus, [place]/AT, No nystagmus ENT:  No icterus,  No thrush, good dentition, no pharyngeal exudate Neck:  No masses, no lymphadenpathy, no bruits CV:  RRR, no rub, no gallop, no S3 Lung:  bibasilar crackles without wheezing. Good air movement.  Abdomen: soft/LLQ and RLQ tender without rebound, +BS, nondistended, no peritoneal signs Ext: No cyanosis, No rashes, No petechiae, No lymphangitis, No edema Neuro: CNII-XII intact, strength 4/5 in bilateral upper and lower extremities, no dysmetria  Labs on Admission:  Basic Metabolic Panel:  Recent Labs Lab 02/03/16 1057  NA 137  K 4.3  CL 100*  CO2 27  GLUCOSE 164*  BUN 20  CREATININE 1.22*  CALCIUM 9.2  Liver Function Tests: No results for input(s): AST, ALT, ALKPHOS, BILITOT, PROT, ALBUMIN in the last 168 hours. No results for input(s): LIPASE, AMYLASE in the last 168 hours. No results for input(s): AMMONIA in the last 168 hours. CBC:  Recent Labs Lab 02/03/16 1057  WBC 12.3*  NEUTROABS 9.8*  HGB 14.4  HCT 43.9  MCV 94.4  PLT 159   Cardiac Enzymes:  Recent Labs Lab 02/03/16 1057  TROPONINI <0.03   BNP: Invalid input(s): POCBNP CBG:  Recent Labs Lab 02/03/16 1041  GLUCAP 163*    Radiological Exams on Admission: Dg Chest 2 View  02/03/2016  CLINICAL DATA:  75 year old female with shortness of breath weakness and altered level of consciousness. Flu like symptoms. Initial encounter. EXAM: CHEST  2 VIEW COMPARISON:  02/01/2016 and earlier.  FINDINGS: Upright AP and lateral views of the chest. Lower lung volumes. Stable mild cardiomegaly. Other mediastinal contours are within normal limits. Visualized tracheal air column is within normal limits. Mild respiratory motion artifact. No pneumothorax, pulmonary edema, pleural effusion or definite acute pulmonary opacity. No acute osseous abnormality identified. IMPRESSION: Lower lung volumes, otherwise no acute cardiopulmonary abnormality. Electronically Signed   By: Odessa Fleming M.D.   On: 02/03/2016 12:21    EKG: Independently reviewed. pending    Time spent:60 minutes Code Status:   FULL Family Communication:   Daughter updated at bedside   Karalyne Nusser, DO  [facility] Pager [phone]  If 7PM-7AM, please contact night-coverage [email or web address] Password [facility] 02/03/2016, 3:46 PM

## 2016-02-03 NOTE — ED Notes (Signed)
MD at bedside. 

## 2016-02-03 NOTE — ED Notes (Signed)
Pt c/o n/v/body aches/cough/congestion/chills x 3 days. Pt daughter reports pt has had increased confusion x 2 days.

## 2016-02-04 DIAGNOSIS — G9340 Encephalopathy, unspecified: Secondary | ICD-10-CM

## 2016-02-04 DIAGNOSIS — I10 Essential (primary) hypertension: Secondary | ICD-10-CM

## 2016-02-04 DIAGNOSIS — J101 Influenza due to other identified influenza virus with other respiratory manifestations: Principal | ICD-10-CM

## 2016-02-04 LAB — COMPREHENSIVE METABOLIC PANEL
ALK PHOS: 38 U/L (ref 38–126)
ALT: 17 U/L (ref 14–54)
ANION GAP: 9 (ref 5–15)
AST: 23 U/L (ref 15–41)
Albumin: 3.4 g/dL — ABNORMAL LOW (ref 3.5–5.0)
BUN: 13 mg/dL (ref 6–20)
CALCIUM: 8.4 mg/dL — AB (ref 8.9–10.3)
CO2: 28 mmol/L (ref 22–32)
Chloride: 105 mmol/L (ref 101–111)
Creatinine, Ser: 0.91 mg/dL (ref 0.44–1.00)
GFR calc non Af Amer: >60 mL/min (ref >60)
Glucose, Bld: 95 mg/dL (ref 65–99)
Potassium: 3.7 mmol/L (ref 3.5–5.1)
SODIUM: 142 mmol/L (ref 135–145)
TOTAL PROTEIN: 6 g/dL — AB (ref 6.5–8.1)
Total Bilirubin: 0.2 mg/dL — ABNORMAL LOW (ref 0.3–1.2)

## 2016-02-04 LAB — CBC
HCT: 37.3 % (ref 36.0–46.0)
HEMOGLOBIN: 11.9 g/dL — AB (ref 12.0–15.0)
MCH: 30.4 pg (ref 26.0–34.0)
MCHC: 31.9 g/dL (ref 30.0–36.0)
MCV: 95.4 fL (ref 78.0–100.0)
Platelets: 118 10*3/uL — ABNORMAL LOW (ref 150–400)
RBC: 3.91 MIL/uL (ref 3.87–5.11)
RDW: 14.8 % (ref 11.5–15.5)
WBC: 5.2 10*3/uL (ref 4.0–10.5)

## 2016-02-04 LAB — HEMOGLOBIN A1C
Hgb A1c MFr Bld: 6.5 % — ABNORMAL HIGH (ref 4.8–5.6)
MEAN PLASMA GLUCOSE: 140 mg/dL

## 2016-02-04 LAB — RESPIRATORY VIRUS PANEL
ADENOVIRUS: NEGATIVE
INFLUENZA B 1: NEGATIVE
Influenza A: POSITIVE — AB
METAPNEUMOVIRUS: NEGATIVE
Parainfluenza 1: NEGATIVE
Parainfluenza 2: NEGATIVE
Parainfluenza 3: NEGATIVE
RHINOVIRUS: NEGATIVE
Respiratory Syncytial Virus A: NEGATIVE
Respiratory Syncytial Virus B: NEGATIVE

## 2016-02-04 LAB — GLUCOSE, CAPILLARY
GLUCOSE-CAPILLARY: 109 mg/dL — AB (ref 65–99)
GLUCOSE-CAPILLARY: 115 mg/dL — AB (ref 65–99)
Glucose-Capillary: 95 mg/dL (ref 65–99)
Glucose-Capillary: 120 mg/dL — ABNORMAL HIGH (ref 65–99)

## 2016-02-04 LAB — VITAMIN B12: VITAMIN B 12: 308 pg/mL (ref 180–914)

## 2016-02-04 LAB — TSH: TSH: 2.712 u[IU]/mL (ref 0.350–4.500)

## 2016-02-04 NOTE — Plan of Care (Signed)
Problem: Acute Rehab PT Goals(only PT should resolve) Goal: Patient Will Transfer Sit To/From Stand Pt will transfer sit to/from-stand with LRAD at ModI without loss-of-balance 5x in <15s to demonstrate good safety awareness for independent mobility in home.     Goal: Pt Will Ambulate Pt will ambulate with PedRW at Supervision for a distance greater than 251ft to demonstrate the ability to perform safe household distance ambulation at discharge.    Goal: Pt Will Go Up/Down Stairs Pt will ascend/descend 5 stairs with LRAD and 1 HR at Supervision to demonstrate safe entry/exit of home.

## 2016-02-04 NOTE — Evaluation (Signed)
Physical Therapy Evaluation Patient Details Name: [name] [name] MRN: [id number] DOB: Feb 07, 1941 Today's Date: 02/04/2016   History of Present Illness  75yo white female, who comes to [facility] on 2/22 after 3D weakness, SOB, Nausea and vomitting. Baseline function includes limited community distances s AD, mostly limited by L hip pain, which daughter believes is realted to some abnormal lumbar imaging. Pt lives with daughter and son in law, and is previosuly indep in all ADL.   Clinical Impression  At evaluation, pt is received standing in room, exiting the BR upon entry, family/caregiver present. The pt is awake and agreeable to participate. Pt reports to feel 101/0 body pain, HA, generalized weakness, and DOE. The pt is alert and oriented x3, pleasant, conversational, and following simple commands consistently when spoken very loudly. Pt reports zero falls in the last 6 months, however demonstrates mild unsteadiness and poor confidence in stability during session. No LOB observed during session. Pt grip strength is strong and symmetrical; global strength as screened during functional mobility assessment presents with mild to moderate impairment, the pt now requiring some physical assistance for bed mobility and gait, whereas the patient performs these indep at baseline Daughter estimates gait to appear 50% impaired compared to baseline.  Pt falls risk is high as evidenced by slow gait speed, forward reach <10", and high falls anxiety during transfers and mobility. Pt received on and remaining on RA throughout evaluation, with no noted desaturation with activity, despite DOE. Patient presenting with impairment of strength, balance, and activity tolerance, limiting ability to perform ADL and mobility tasks at  baseline level of function. Patient will benefit from skilled intervention to address the above impairments and limitations, in order to restore to prior level of function, improve patient safety upon  discharge, and to decrease falls risk.       Follow Up Recommendations Home health PT    Equipment Recommendations  Other (comment) (Pediatric RW. )    Recommendations for Other Services       Precautions / Restrictions Precautions Precautions: None Restrictions Weight Bearing Restrictions: No      Mobility  Bed Mobility Overal bed mobility: Modified Independent             General bed mobility comments: Additional time to perform.   Transfers Overall transfer level: Modified independent Equipment used: None                Ambulation/Gait Ambulation/Gait assistance: Min guard Ambulation Distance (Feet): 90 Feet Assistive device: None (IV pole/furniture in room. )   Gait velocity: 0.76m/s Gait velocity interpretation: <1.8 ft/sec, indicative of risk for recurrent falls    Stairs            Wheelchair Mobility    Modified Rankin (Stroke Patients Only)       Balance Overall balance assessment: No apparent balance deficits (not formally assessed) (fearful of falling; feels unstable at eval. )                                           Pertinent Vitals/Pain Pain Assessment: 0-10 (reports headache adn body pain. ) Pain Score: 9  Pain Intervention(s): Limited activity within patient's tolerance;Monitored during session;Patient requesting pain meds-RN notified    Home Living Family/patient expects to be discharged to:: Private residence Living Arrangements: Children Available Help at Discharge: Family Type of Home: House Home Access: Stairs to enter  Entrance Stairs-Rails: Lawyer of Steps: 3 Home Layout: One level Home Equipment: None      Prior Function Level of Independence: Needs assistance         Comments: pt no longer drives, does not cook, limited to short community distances.      Hand Dominance        Extremity/Trunk Assessment   Upper Extremity Assessment: Generalized  weakness;Overall [facility] for tasks assessed           Lower Extremity Assessment: Generalized weakness;Overall WFL for tasks assessed         Communication   Communication: HOH  Cognition Arousal/Alertness: Awake/alert Behavior During Therapy: WFL for tasks assessed/performed Overall Cognitive Status: Within Functional Limits for tasks assessed                      General Comments      Exercises        Assessment/Plan    PT Assessment Patient needs continued PT services  PT Diagnosis Difficulty walking;Generalized weakness;Abnormality of gait   PT Problem List Decreased strength;Decreased range of motion;Decreased activity tolerance;Decreased balance;Decreased mobility  PT Treatment Interventions Gait training;Therapeutic activities;DME instruction;Functional mobility training;Balance training;Therapeutic exercise;Patient/family education   PT Goals (Current goals can be found in the Care Plan section) Acute Rehab PT Goals Patient Stated Goal: regain strength, resolve SOB and pain.  PT Goal Formulation: With patient/family Time For Goal Achievement: 02/18/16 Potential to Achieve Goals: Good    Frequency Min 3X/week   Barriers to discharge Decreased caregiver support unassisted at home for some periods during the day.     Co-evaluation               End of Session Equipment Utilized During Treatment: Gait belt Activity Tolerance: Patient tolerated treatment well;Patient limited by fatigue Patient left: in bed;with call bell/phone within reach;with family/visitor present Nurse Communication: Mobility status         Time: 1610-9604 PT Time Calculation (min) (ACUTE ONLY): 20 min   Charges:   PT Evaluation $PT Eval Low Complexity: 1 Procedure PT Treatments $Therapeutic Activity: 8-22 mins   PT G Codes:        10:59 AM, 18-Feb-2016 Rosamaria Lints, PT, DPT PRN Physical Therapist at [facility] [place] License # [id number] [phone] (wireless)   [phone] (mobile)

## 2016-02-04 NOTE — Progress Notes (Signed)
Daughter in room, reported patient on Lexapro, refused Celexa, refused Neurontin.  Will notify Ardyth Harps.

## 2016-02-04 NOTE — Progress Notes (Signed)
[facility] PROGRESS NOTE  [name] [name] UEA:[id number] DOB: December 13, 1940 DOA: 02/03/2016 PCP: Wilson Singer, MD  Assessment/Plan: Influenza A - this likely explains most of her symptoms. -Will be started on Tamiflu which we will continue for 5 days.  Generalized weakness -Likely related to influenza. -Has been assessed by physical therapy with recommendations for home health PT. -Check TSH, vitamin B 12.  Acute encephalopathy -Likely due to influenza and dehydration, resolved.  Hypertension -Well-controlled, lisinopril on hold  Type 2 diabetes -Well controlled, continue current regimen  Hyperlipidemia -Continue statin  Code Status: Full code Family Communication: Patient only  Disposition Plan: Anticipate discharge home in 24 hours   Consultants:  None   Antibiotics:  None                  Anti-infectives   Tamiflu  Subjective: Feels much improved, still is weak  Objective: Filed Vitals:   02/03/16 2235 02/04/16 0629 02/04/16 1000 02/04/16 1047  BP: 117/48 110/76 125/60   Pulse: 70 65 60 76  Temp: 98.6 F (37 C) 98.1 F (36.7 C) 98.3 F (36.8 C)   TempSrc: Oral Oral Oral   Resp: Height:      Weight:      SpO2: 94% 98% 96% 97%    Intake/Output Summary (Last 24 hours) at 02/04/16 1429 Last data filed at 02/04/16 0936  Gross per 24 hour  Intake   1080 ml  Output    500 ml  Net    580 ml   Filed Weights   02/03/16 1038 02/03/16 1656  Weight: 83.915 kg (185 lb) 84.369 kg (186 lb)    Exam:   General:  Alert, awake, oriented 3, very hard of hearing  Cardiovascular: Regular rate and rhythm, no murmurs, rubs or gallops  Respiratory: Clear to auscultation bilaterally  Abdomen: Obese, soft, nontender, nondistended, positive bowel sounds  Extremities: Trace bilateral pitting edema   Neurologic:  Grossly intact and nonfocal  Data Reviewed: Basic Metabolic Panel:  Recent Labs Lab 02/03/16 1057  NA 137  K  4.3  CL 100*  CO2 27  GLUCOSE 164*  BUN 20  CREATININE 1.22*  CALCIUM 9.2   Liver Function Tests:  Recent Labs Lab 02/03/16 1725  AST 24  ALT 19  ALKPHOS 45  BILITOT 0.3  PROT 6.5  ALBUMIN 3.5   No results for input(s): LIPASE, AMYLASE in the last 168 hours. No results for input(s): AMMONIA in the last 168 hours. CBC:  Recent Labs Lab 02/03/16 1057 02/04/16 0601  WBC 12.3* 5.2  NEUTROABS 9.8*  --   HGB 14.4 11.9*  HCT 43.9 37.3  MCV 94.4 95.4  PLT 159 118*   Cardiac Enzymes:  Recent Labs Lab 02/03/16 1057  TROPONINI <0.03   BNP (last 3 results) No results for input(s): BNP in the last 8760 hours.  ProBNP (last 3 results) No results for input(s): PROBNP in the last 8760 hours.  CBG:  Recent Labs Lab 02/03/16 1041 02/03/16 2232 02/04/16 0744 02/04/16 1120  GLUCAP 163* 102* 109* 95    Recent Results (from the past 240 hour(s))  Urine culture     Status: None (Preliminary result)   Collection Time: 02/03/16 12:05 PM  Result Value Ref Range Status   Specimen Description URINE, CLEAN CATCH  Final   Special Requests IMMUNE:COMPROMISED  Final   Culture   Final    >=100,000 COLONIES/mL GRAM NEGATIVE RODS CULTURE REINCUBATED FOR BETTER GROWTH Performed  at [facility]    Report Status PENDING  Incomplete  Culture, blood (Routine X 2) w Reflex to ID Panel     Status: None (Preliminary result)   Collection Time: 02/03/16  5:25 PM  Result Value Ref Range Status   Specimen Description BLOOD RIGHT ANTECUBITAL  Final   Special Requests BOTTLES DRAWN AEROBIC AND ANAEROBIC 5CC EACH  Final   Culture NO GROWTH < 24 HOURS  Final   Report Status PENDING  Incomplete  MRSA PCR Screening     Status: Abnormal   Collection Time: 02/03/16  5:45 PM  Result Value Ref Range Status   MRSA by PCR POSITIVE (A) NEGATIVE Final    Comment:        The GeneXpert MRSA Assay (FDA approved for NASAL specimens only), is one component of a comprehensive MRSA  colonization surveillance program. It is not intended to diagnose MRSA infection nor to guide or monitor treatment for MRSA infections. RESULT CALLED TO, READ BACK BY AND VERIFIED WITH: HAIGHT,Z ON 02/03/16 AT 2205 BY LOY,C   Culture, blood (Routine X 2) w Reflex to ID Panel     Status: None (Preliminary result)   Collection Time: 02/03/16  8:18 PM  Result Value Ref Range Status   Specimen Description RIGHT ANTECUBITAL  Final   Special Requests BOTTLES DRAWN AEROBIC AND ANAEROBIC 6CC  Final   Culture NO GROWTH < 24 HOURS  Final   Report Status PENDING  Incomplete     Studies: Dg Chest 2 View  02/03/2016  CLINICAL DATA:  75 year old female with shortness of breath weakness and altered level of consciousness. Flu like symptoms. Initial encounter. EXAM: CHEST  2 VIEW COMPARISON:  02/01/2016 and earlier. FINDINGS: Upright AP and lateral views of the chest. Lower lung volumes. Stable mild cardiomegaly. Other mediastinal contours are within normal limits. Visualized tracheal air column is within normal limits. Mild respiratory motion artifact. No pneumothorax, pulmonary edema, pleural effusion or definite acute pulmonary opacity. No acute osseous abnormality identified. IMPRESSION: Lower lung volumes, otherwise no acute cardiopulmonary abnormality. Electronically Signed   By: Odessa Fleming M.D.   On: 02/03/2016 12:21    Scheduled Meds: . Chlorhexidine Gluconate Cloth  6 each Topical Q0600  . citalopram  40 mg Oral Daily  . enoxaparin (LOVENOX) injection  40 mg Subcutaneous Q24H  . gabapentin  300 mg Oral BID  . insulin aspart  0-9 Units Subcutaneous TID WC  . mupirocin ointment  1 application Nasal BID  . oseltamivir  30 mg Oral BID  . pantoprazole  80 mg Oral Daily  . simvastatin  40 mg Oral Daily  . sodium chloride flush  3 mL Intravenous Q12H  . valACYclovir  500 mg Oral Daily   Continuous Infusions: . sodium chloride 75 mL/hr at 02/04/16 1610    Principal Problem:   Influenza  A Active Problems:   Dehydration   Generalized weakness   Acute bronchitis   Acute encephalopathy   Essential hypertension    Time spent: 25 minutes. Greater than 50% of this time was spent in direct contact with the patient coordinating care.    [name] [name]  [facility] Pager [phone]  If 7PM-7AM, please contact night-coverage at [email or web address], password [facility] 02/04/2016, 2:29 PM  LOS: 1 day

## 2016-02-05 LAB — CBC
HCT: 38.1 % (ref 36.0–46.0)
HEMOGLOBIN: 12.4 g/dL (ref 12.0–15.0)
MCH: 30.7 pg (ref 26.0–34.0)
MCHC: 32.5 g/dL (ref 30.0–36.0)
MCV: 94.3 fL (ref 78.0–100.0)
PLATELETS: 119 10*3/uL — AB (ref 150–400)
RBC: 4.04 MIL/uL (ref 3.87–5.11)
RDW: 14.4 % (ref 11.5–15.5)
WBC: 5.5 10*3/uL (ref 4.0–10.5)

## 2016-02-05 LAB — GLUCOSE, CAPILLARY
GLUCOSE-CAPILLARY: 105 mg/dL — AB (ref 65–99)
GLUCOSE-CAPILLARY: 152 mg/dL — AB (ref 65–99)

## 2016-02-05 LAB — URINE CULTURE

## 2016-02-05 MED ORDER — OSELTAMIVIR PHOSPHATE 30 MG PO CAPS: 30.0000 mg | ORAL_CAPSULE | Freq: Two times a day (BID) | ORAL | Status: DC

## 2016-02-05 MED ORDER — HYDROCOD POLST-CPM POLST ER 10-8 MG/5ML PO SUER: 2.5000 mL | Freq: Two times a day (BID) | ORAL | Status: DC | PRN

## 2016-02-05 NOTE — Discharge Summary (Signed)
Physician Discharge Summary  Scharlene Corn WUJ:[id number] DOB: September 28, 1941 DOA: 02/03/2016  PCP: Wilson Singer, MD  Admit date: 02/03/2016 Discharge date: 02/05/2016  Time spent: 45 minutes  Recommendations for Outpatient Follow-up:  -We'll be discharge home today. -Advised to follow-up with primary care provider in 2 weeks. -Has 4 days remaining of Tamiflu upon discharge.   Discharge Diagnoses:  Principal Problem:   Influenza A Active Problems:   Dehydration   Generalized weakness   Acute bronchitis   Acute encephalopathy   Essential hypertension   Discharge Condition: Stable and improved  Filed Weights   02/03/16 1038 02/03/16 1656  Weight: 83.915 kg (185 lb) 84.369 kg (186 lb)    History of present illness:  As per Dr. Arbutus Leas on 2/22: 75 year old female with a history of genital herpes, hyperlipidemia, hypertension, chronic lower abdominal pain presented with three-day history of progressive generalized weakness. Approximately 3 days prior to this admission, the patient began developing upper respiratory symptoms which included cough, chest congestion, and some mild dyspnea on exertion. She went to see her primary care provider Karilyn Cota) on 02/01/2016, and she was given azithromycin. However, her clinical condition worsened when she began developing nausea and vomiting on 02/02/2016 resulting in increasing generalized weakness to the point where she was having difficulty getting out of bed. The patient went to see her primary care provider on the day of admission, and was directed to the emergency department. At home, the patient had 4-5 episodes of vomiting prior to coming to the emergency department. In addition, the patient had a fever up to 101.16F on 02/02/2016. She denies any headache, neck pain, rashes, chest pain, shortness breath, diarrhea, hematochezia, dysuria, hematuria. She had a temperature of 100.16F and was hemodynamically stable in the emergency department.  She was given 2 L normal saline. Labs revealed a WBC 4.3 with serum creatinine 1.22. Urinalysis showed 6-30 WBCs. The patient was given a dose of empiric ceftriaxone emergency department.   Hospital Course:   Influenza A - this likely explains most of her symptoms. -Will be started on Tamiflu; 4 days remaining at time of discharge  Generalized weakness -Likely related to influenza. -Has been assessed by physical therapy with recommendations for home health PT. -TSH/B-12 within normal limits  Acute encephalopathy -Likely due to influenza and dehydration, resolved.  Hypertension -Well-controlled.  Type 2 diabetes -Well controlled, continue current regimen  Hyperlipidemia -Continue statin   Procedures:  None   Consultations:  None  Discharge Instructions  Discharge Instructions    Diet - low sodium heart healthy    Complete by:  As directed      Increase activity slowly    Complete by:  As directed             Medication List    STOP taking these medications        azithromycin 250 MG tablet  Commonly known as:  ZITHROMAX      TAKE these medications        chlorpheniramine-HYDROcodone 10-8 MG/5ML Suer  Commonly known as:  TUSSIONEX PENNKINETIC ER  Take 2.5 mLs by mouth every 12 (twelve) hours as needed for cough.     citalopram 40 MG tablet  Commonly known as:  CELEXA  Take 40 mg by mouth daily.     gabapentin 300 MG capsule  Commonly known as:  NEURONTIN  Take 300 mg by mouth 2 (two) times daily. Reported on 02/03/2016     lisinopril 2.5 MG tablet  Commonly known  as:  PRINIVIL,ZESTRIL  Take 2.5 mg by mouth daily.     metFORMIN 500 MG tablet  Commonly known as:  GLUCOPHAGE  Take 500 mg by mouth as needed.     omeprazole 40 MG capsule  Commonly known as:  PRILOSEC  Take 40 mg by mouth daily.     oseltamivir 30 MG capsule  Commonly known as:  TAMIFLU  Take 1 capsule (30 mg total) by mouth 2 (two) times daily.     simvastatin 40 MG tablet    Commonly known as:  ZOCOR  Take 40 mg by mouth daily.     valACYclovir 1000 MG tablet  Commonly known as:  VALTREX  Take 1 bid for 5 days then 1 daily       Allergies  Allergen Reactions  . Penicillins Nausea Only    Has patient had a PCN reaction causing immediate rash, facial/tongue/throat swelling, SOB or lightheadedness with hypotension: Yes Has patient had a PCN reaction causing severe rash involving mucus membranes or skin necrosis: No Has patient had a PCN reaction that required hospitalization No Has patient had a PCN reaction occurring within the last 10 years: No If all of the above answers are "NO", then may proceed with Cephalosporin use.   . Sulfa Antibiotics Nausea And Vomiting       Follow-up Information    Follow up with Wilson Singer, MD. Schedule an appointment as soon as possible for a visit in 2 weeks.   Specialty:  Internal Medicine   Contact information:   [address] [place] [address] [phone]        The results of significant diagnostics from this hospitalization (including imaging, microbiology, ancillary and laboratory) are listed below for reference.    Significant Diagnostic Studies: Dg Chest 2 View  02/03/2016  CLINICAL DATA:  75 year old female with shortness of breath weakness and altered level of consciousness. Flu like symptoms. Initial encounter. EXAM: CHEST  2 VIEW COMPARISON:  02/01/2016 and earlier. FINDINGS: Upright AP and lateral views of the chest. Lower lung volumes. Stable mild cardiomegaly. Other mediastinal contours are within normal limits. Visualized tracheal air column is within normal limits. Mild respiratory motion artifact. No pneumothorax, pulmonary edema, pleural effusion or definite acute pulmonary opacity. No acute osseous abnormality identified. IMPRESSION: Lower lung volumes, otherwise no acute cardiopulmonary abnormality. Electronically Signed   By: Odessa Fleming M.D.   On: 02/03/2016 12:21   Dg Chest 2  View  02/01/2016  CLINICAL DATA:  Nonproductive cough, congestion, shortness of Breath EXAM: CHEST  2 VIEW COMPARISON:  01/30/2015 FINDINGS: Cardiomediastinal silhouette is stable. No acute infiltrate or pleural effusion. No pulmonary edema. Mild degenerative changes mid thoracic spine. Degenerative changes lumbar spine. IMPRESSION: No active cardiopulmonary disease. Mild degenerative changes thoracolumbar spine. Electronically Signed   By: Natasha Mead M.D.   On: 02/01/2016 15:16   US Transvaginal Non-ob  01/17/2016  GYNECOLOGIC SONOGRAM [place] is a 75 y.o. G3P3 s/p total hysterectomy,is here for a pelvic sonogram for LLQ pain. Uterus                     surgically absent,normal vag cuff Endometrium          n/a Right ovary            Surgically absent,no adnexal mass or free fluid seen Left ovary               Surgically absent,no adnexal mass or free fluid seen  Technician Comments: PELVIC US TA/TV: normal vag cuff,no adnexal masses or free fluid seen,no pain during ultrasound [employer] 01/11/2016 9:47 AM Clinical Impression and recommendations: I have reviewed the sonogram results above. Combined with the patient's current clinical course, below are my impressions and any appropriate recommendations for management based on the sonographic findings: 1. Pelvic anatomy consistent with clinical history of surgical removal of uterus and cervix. Without visible adnexal structures. 2,no obvious pathology to explain patient pain. FERGUSON,JOHN V  US Pelvis Complete  01/17/2016  GYNECOLOGIC SONOGRAM [name] is a 75 y.o. G3P3 s/p total hysterectomy,is here for a pelvic sonogram for LLQ pain. Uterus                     surgically absent,normal vag cuff Endometrium          n/a Right ovary            Surgically absent,no adnexal mass or free fluid seen Left ovary               Surgically absent,no adnexal mass or free fluid seen  Technician Comments: PELVIC US TA/TV: normal vag cuff,no adnexal masses  or free fluid seen,no pain during ultrasound [employer] 01/11/2016 9:47 AM Clinical Impression and recommendations: I have reviewed the sonogram results above. Combined with the patient's current clinical course, below are my impressions and any appropriate recommendations for management based on the sonographic findings: 1. Pelvic anatomy consistent with clinical history of surgical removal of uterus and cervix. Without visible adnexal structures. 2,no obvious pathology to explain patient pain. FERGUSON,JOHN V  Dg Hip Unilat With Pelvis 2-3 Views Left  01/18/2016  CLINICAL DATA:  LEFT hip pain for 3 months posteriorly, no injury, history hypertension, diabetes mellitus EXAM: DG HIP (WITH OR WITHOUT PELVIS) 2-3V LEFT COMPARISON:  None FINDINGS: Diffuse osseous demineralization. Hip and SI joint spaces preserved. No acute fracture, dislocation or bone destruction. Degenerative disc and facet disease changes at visualized lower lumbar spine. IMPRESSION: No acute abnormalities. Electronically Signed   By: Ulyses Southward M.D.   On: 01/18/2016 12:36    Microbiology: Recent Results (from the past 240 hour(s))  Urine culture     Status: None   Collection Time: 02/03/16 12:05 PM  Result Value Ref Range Status   Specimen Description URINE, CLEAN CATCH  Final   Special Requests IMMUNE:COMPROMISED  Final   Culture   Final    >=100,000 COLONIES/mL ESCHERICHIA COLI Performed at [facility]    Report Status 02/05/2016 FINAL  Final   Organism ID, Bacteria ESCHERICHIA COLI  Final      Susceptibility   Escherichia coli - MIC*    AMPICILLIN >=32 RESISTANT Resistant     CEFAZOLIN >=64 RESISTANT Resistant     CEFTRIAXONE <=1 SENSITIVE Sensitive     CIPROFLOXACIN <=0.25 SENSITIVE Sensitive     GENTAMICIN <=1 SENSITIVE Sensitive     IMIPENEM <=0.25 SENSITIVE Sensitive     NITROFURANTOIN <=16 SENSITIVE Sensitive     TRIMETH/SULFA <=20 SENSITIVE Sensitive     AMPICILLIN/SULBACTAM 16 INTERMEDIATE  Intermediate     PIP/TAZO <=4 SENSITIVE Sensitive     * >=100,000 COLONIES/mL ESCHERICHIA COLI  Culture, blood (Routine X 2) w Reflex to ID Panel     Status: None (Preliminary result)   Collection Time: 02/03/16  5:25 PM  Result Value Ref Range Status   Specimen Description BLOOD RIGHT ANTECUBITAL  Final   Special Requests BOTTLES DRAWN AEROBIC AND ANAEROBIC  5CC EACH  Final   Culture NO GROWTH 2 DAYS  Final   Report Status PENDING  Incomplete  MRSA PCR Screening     Status: Abnormal   Collection Time: 02/03/16  5:45 PM  Result Value Ref Range Status   MRSA by PCR POSITIVE (A) NEGATIVE Final    Comment:        The GeneXpert MRSA Assay (FDA approved for NASAL specimens only), is one component of a comprehensive MRSA colonization surveillance program. It is not intended to diagnose MRSA infection nor to guide or monitor treatment for MRSA infections. RESULT CALLED TO, READ BACK BY AND VERIFIED WITH: HAIGHT,Z ON 02/03/16 AT 2205 BY LOY,C   Respiratory virus panel     Status: Abnormal   Collection Time: 02/03/16  6:08 PM  Result Value Ref Range Status   Respiratory Syncytial Virus A Negative Negative Final   Respiratory Syncytial Virus B Negative Negative Final   Influenza A Positive (A) Negative Final    Comment: Subtype: H3   Influenza B Negative Negative Final   Parainfluenza 1 Negative Negative Final   Parainfluenza 2 Negative Negative Final   Parainfluenza 3 Negative Negative Final   Metapneumovirus Negative Negative Final   Rhinovirus Negative Negative Final   Adenovirus Negative Negative Final    Comment: (NOTE) Performed At: [facility] [address] [place], [place] [id number] Mila Homer MD WU:[id number]   Culture, blood (Routine X 2) w Reflex to ID Panel     Status: None (Preliminary result)   Collection Time: 02/03/16  8:18 PM  Result Value Ref Range Status   Specimen Description BLOOD RIGHT ANTECUBITAL  Final   Special Requests BOTTLES DRAWN  AEROBIC AND ANAEROBIC 6CC  Final   Culture NO GROWTH 2 DAYS  Final   Report Status PENDING  Incomplete     Labs: Basic Metabolic Panel:  Recent Labs Lab 02/03/16 1057 02/04/16 0601  NA 137 142  K 4.3 3.7  CL 100* 105  CO2 27 28  GLUCOSE 164* 95  BUN 20 13  CREATININE 1.22* 0.91  CALCIUM 9.2 8.4*   Liver Function Tests:  Recent Labs Lab 02/03/16 1725 02/04/16 0601  AST 24 23  ALT 19 17  ALKPHOS 45 38  BILITOT 0.3 0.2*  PROT 6.5 6.0*  ALBUMIN 3.5 3.4*   No results for input(s): LIPASE, AMYLASE in the last 168 hours. No results for input(s): AMMONIA in the last 168 hours. CBC:  Recent Labs Lab 02/03/16 1057 02/04/16 0601 02/05/16 0656  WBC 12.3* 5.2 5.5  NEUTROABS 9.8*  --   --   HGB 14.4 11.9* 12.4  HCT 43.9 37.3 38.1  MCV 94.4 95.4 94.3  PLT 159 118* 119*   Cardiac Enzymes:  Recent Labs Lab 02/03/16 1057  TROPONINI <0.03   BNP: BNP (last 3 results) No results for input(s): BNP in the last 8760 hours.  ProBNP (last 3 results) No results for input(s): PROBNP in the last 8760 hours.  CBG:  Recent Labs Lab 02/04/16 1120 02/04/16 1651 02/04/16 2106 02/05/16 0754 02/05/16 1121  GLUCAP 95 120* 115* 105* 152*       Signed:  HERNANDEZ ACOSTA,ESTELA  [facility] Pager: [phone] 02/05/2016, 12:25 PM

## 2016-02-08 LAB — CULTURE, BLOOD (ROUTINE X 2)
CULTURE: NO GROWTH
Culture: NO GROWTH

## 2016-02-22 ENCOUNTER — Telehealth: Payer: Self-pay | Admitting: Adult Health

## 2016-02-22 ENCOUNTER — Other Ambulatory Visit: Payer: Medicare HMO

## 2016-02-22 MED ORDER — FLUCONAZOLE 150 MG PO TABS: ORAL_TABLET | ORAL | Status: DC

## 2016-02-22 NOTE — Telephone Encounter (Signed)
Spoke with Alexia Freestoneatty, pt's daughter. Pt has been on antibiotics and has vaginal itching. Thinks it's a yeast infection. Can you order Diflucan? Thanks!! JSY

## 2016-02-22 NOTE — Telephone Encounter (Signed)
Will rx diflucan  

## 2016-03-16 ENCOUNTER — Other Ambulatory Visit (HOSPITAL_COMMUNITY): Payer: Self-pay | Admitting: Internal Medicine

## 2016-03-16 DIAGNOSIS — M25552 Pain in left hip: Secondary | ICD-10-CM

## 2016-03-25 ENCOUNTER — Ambulatory Visit (HOSPITAL_COMMUNITY): Payer: Medicare HMO

## 2016-04-04 ENCOUNTER — Ambulatory Visit (HOSPITAL_COMMUNITY)
Admission: RE | Admit: 2016-04-04 | Discharge: 2016-04-04 | Disposition: A | Discharge status: Home / Self Care | Payer: Medicare HMO | Source: Ambulatory Visit | Attending: Internal Medicine | Admitting: Internal Medicine

## 2016-04-04 DIAGNOSIS — M25552 Pain in left hip: Secondary | ICD-10-CM

## 2016-04-04 DIAGNOSIS — M47896 Other spondylosis, lumbar region: Secondary | ICD-10-CM | POA: Diagnosis not present

## 2016-04-13 ENCOUNTER — Ambulatory Visit: Payer: Medicare HMO | Admitting: Orthopaedic Surgery

## 2016-04-18 ENCOUNTER — Ambulatory Visit (INDEPENDENT_AMBULATORY_CARE_PROVIDER_SITE_OTHER): Payer: Medicare HMO

## 2016-04-18 ENCOUNTER — Ambulatory Visit (INDEPENDENT_AMBULATORY_CARE_PROVIDER_SITE_OTHER): Payer: Medicare HMO | Admitting: Orthopedic Surgery

## 2016-04-18 VITALS — BP 125/68 | Ht 62.0 in | Wt 185.0 lb

## 2016-04-18 DIAGNOSIS — M5442 Lumbago with sciatica, left side: Secondary | ICD-10-CM

## 2016-04-18 MED ORDER — PREGABALIN 50 MG PO CAPS: 50.0000 mg | ORAL_CAPSULE | Freq: Every day | ORAL | Status: DC

## 2016-04-18 NOTE — Patient Instructions (Signed)
CALL [facility] THERAPY DEPT TO SCHEDULE THERAPY VISITS [phone] 

## 2016-04-19 NOTE — Progress Notes (Signed)
Patient ID: [name] [name], female   DOB: 04/30/1941, 75 y.o.   MRN: [id number]  Chief Complaint  Patient presents with  . Hip Pain    Left hip pain, no injury, referred by Dr. Karilyn Cota.    HPI [name] is a 75 y.o. female.  75 year old female presents for left hip pain evaluation. Pain present for 2 years. No injury. Complains of pain catching stiffness giving out numbness throbbing which is constant. Rates pain 8 out of 10. She had an MRI and x-ray and an ultrasound. She had an injection of steroids and she was put on tramadol. She's not doing any better. Excessive walking exacerbates her symptoms.  She complains of review of systems findings fatigue sinusitis shortness of breath constipation cold intolerance depression anxiety seasonal allergy and then numbness tingling burning pain in her legs dizziness weakness tremors lightheadedness joint pain limb pain muscle weakness stiff joints and back pain  Review of Systems Review of Systems see history of present illness  Past Medical History  Diagnosis Date  . Genital herpes   . GERD (gastroesophageal reflux disease)   . Hyperlipidemia   . Hypertension   . Diabetes mellitus without complication (HCC)   . Anxiety   . Recurrent UTI   . Herpes 12/22/2015  . Urinary frequency 01/04/2016  . Burning with urination 01/04/2016  . Pelvic pressure in female 01/04/2016  . Hematuria 01/04/2016  . LLQ abdominal tenderness 01/04/2016    Past Surgical History  Procedure Laterality Date  . Abdominal hysterectomy    . Colon surgery      blocked colon    Family History  Problem Relation Age of Onset  . Diabetes Mother   . Heart disease Brother   . Hypertension Brother   . Diabetes Brother   . Hypertension Daughter   . Diabetes Brother   . Diabetes Brother   . Alzheimer's disease Brother   . Anxiety disorder Daughter    was reviewed  Social History Social History  Substance Use Topics  . Smoking status: Never Smoker   . Smokeless  tobacco: Current User    Types: Snuff  . Alcohol Use: No    Allergies  Allergen Reactions  . Penicillins Nausea Only    Has patient had a PCN reaction causing immediate rash, facial/tongue/throat swelling, SOB or lightheadedness with hypotension: Yes Has patient had a PCN reaction causing severe rash involving mucus membranes or skin necrosis: No Has patient had a PCN reaction that required hospitalization No Has patient had a PCN reaction occurring within the last 10 years: No If all of the above answers are "NO", then may proceed with Cephalosporin use.   . Sulfa Antibiotics Nausea And Vomiting    Current Outpatient Prescriptions  Medication Sig Dispense Refill  . chlorpheniramine-HYDROcodone (TUSSIONEX PENNKINETIC ER) 10-8 MG/5ML SUER Take 2.5 mLs by mouth every 12 (twelve) hours as needed for cough. 140 mL 0  . citalopram (CELEXA) 40 MG tablet Take 40 mg by mouth daily.    . fluconazole (DIFLUCAN) 150 MG tablet Take 1 now and repeat 1 in 3 days if needed 2 tablet 1  . lisinopril (PRINIVIL,ZESTRIL) 2.5 MG tablet Take 2.5 mg by mouth daily.    . metFORMIN (GLUCOPHAGE) 500 MG tablet Take 500 mg by mouth as needed.    Marland Kitchen omeprazole (PRILOSEC) 40 MG capsule Take 40 mg by mouth daily.    Marland Kitchen oseltamivir (TAMIFLU) 30 MG capsule Take 1 capsule (30 mg total) by mouth 2 (two) times  daily. 8 capsule 0  . pregabalin (LYRICA) 50 MG capsule Take 1 capsule (50 mg total) by mouth daily. 30 capsule 1  . simvastatin (ZOCOR) 40 MG tablet Take 40 mg by mouth daily.    . valACYclovir (VALTREX) 1000 MG tablet Take 1 bid for 5 days then 1 daily 30 tablet 11  . [DISCONTINUED] gabapentin (NEURONTIN) 300 MG capsule Take 300 mg by mouth 2 (two) times daily. Reported on 02/03/2016     No current facility-administered medications for this visit.       Physical Exam Physical Exam  Constitutional: She is oriented to person, place, and time. She appears well-developed and well-nourished. No distress.    Cardiovascular: Normal rate and intact distal pulses.   Musculoskeletal:       Feet:  Neurological: She is alert and oriented to person, place, and time. She has normal reflexes. She exhibits normal muscle tone. Coordination normal.  Skin: Skin is warm and dry. No rash noted. She is not diaphoretic. No erythema. No pallor.  Psychiatric: She has a normal mood and affect. Her behavior is normal. Judgment and thought content normal.    Right Hip Exam   Tenderness  The patient is experiencing no tenderness.     Range of Motion  Flexion: normal  Abduction: normal   Muscle Strength  Flexion: 5/5   Other  Erythema: absent Scars: absent Sensation: normal Pulse: present   Left Hip Exam   Tenderness  The patient is experiencing no tenderness.     Range of Motion  Flexion: normal  Abduction: normal   Muscle Strength  Flexion: 5/5   Other  Erythema: absent Scars: absent Sensation: normal Pulse: present   Back Exam   Tenderness  The patient is experiencing tenderness in the lumbar.  Range of Motion  Extension: abnormal  Flexion: abnormal   Muscle Strength  Right Quadriceps:  5/5  Left Quadriceps:  5/5  Right Hamstrings:  5/5  Left Hamstrings:  5/5   Tests  Straight leg raise right: negative Straight leg raise left: negative  Reflexes  Babinski's sign: normal   Other  Gait: antalgic        Neurologic examination  Reflexes were 2+ and equal  Sensation was normal in right foot  Babinski's tests were down going  Straight leg raise testing was normal bilaterally  The vascular examination revealed normal dorsalis pedis pulses in both feet and both feet were warm with good capillary refill   Data Reviewed xrays of the hip i read, they are normal   New back xrays were ordered and i read as lumbar spondylosis and disc disease  Assessment  Encounter Diagnosis  Name Primary?  . Left-sided low back pain with left-sided sciatica Yes       Plan  PHYSICAL THERAPY  RETURN 6 WEEKS START LYRICA

## 2016-04-20 ENCOUNTER — Telehealth: Payer: Self-pay | Admitting: Orthopedic Surgery

## 2016-04-20 NOTE — Telephone Encounter (Signed)
Patient's daughter Alexia Freestone(Patty) called saying that [facility] had sent a prior authorization for the Lyrica prescription that was sent on 04/18/16. She was just wondering if it had been signed. She said her mom really needed the medicine. [facility] has resent the form and its in the nurse's box upfront.   Please advise

## 2016-04-21 NOTE — Telephone Encounter (Signed)
PRE-AUTHORIZATION FORM COMPLETED AND FAXED BACK TO INSURANCE COMPANY

## 2016-04-27 ENCOUNTER — Emergency Department (HOSPITAL_COMMUNITY): Payer: Medicare HMO

## 2016-04-27 ENCOUNTER — Inpatient Hospital Stay (HOSPITAL_COMMUNITY)
Admission: EM | Admit: 2016-04-27 | Discharge: 2016-05-01 | DRG: 390 | Disposition: A | Discharge status: Home / Self Care | Payer: Medicare HMO | Attending: Internal Medicine | Admitting: Internal Medicine

## 2016-04-27 ENCOUNTER — Encounter (HOSPITAL_COMMUNITY): Payer: Self-pay

## 2016-04-27 DIAGNOSIS — I10 Essential (primary) hypertension: Secondary | ICD-10-CM | POA: Diagnosis present

## 2016-04-27 DIAGNOSIS — F17220 Nicotine dependence, chewing tobacco, uncomplicated: Secondary | ICD-10-CM | POA: Diagnosis present

## 2016-04-27 DIAGNOSIS — Z90710 Acquired absence of both cervix and uterus: Secondary | ICD-10-CM

## 2016-04-27 DIAGNOSIS — D751 Secondary polycythemia: Secondary | ICD-10-CM | POA: Diagnosis present

## 2016-04-27 DIAGNOSIS — Z833 Family history of diabetes mellitus: Secondary | ICD-10-CM

## 2016-04-27 DIAGNOSIS — Z7984 Long term (current) use of oral hypoglycemic drugs: Secondary | ICD-10-CM | POA: Diagnosis not present

## 2016-04-27 DIAGNOSIS — E860 Dehydration: Secondary | ICD-10-CM | POA: Diagnosis present

## 2016-04-27 DIAGNOSIS — K565 Intestinal adhesions [bands] with obstruction (postprocedural) (postinfection): Principal | ICD-10-CM | POA: Diagnosis present

## 2016-04-27 DIAGNOSIS — F329 Major depressive disorder, single episode, unspecified: Secondary | ICD-10-CM | POA: Diagnosis present

## 2016-04-27 DIAGNOSIS — D72829 Elevated white blood cell count, unspecified: Secondary | ICD-10-CM | POA: Diagnosis present

## 2016-04-27 DIAGNOSIS — K5669 Other intestinal obstruction: Secondary | ICD-10-CM | POA: Diagnosis not present

## 2016-04-27 DIAGNOSIS — Z933 Colostomy status: Secondary | ICD-10-CM

## 2016-04-27 DIAGNOSIS — E119 Type 2 diabetes mellitus without complications: Secondary | ICD-10-CM

## 2016-04-27 DIAGNOSIS — Z9049 Acquired absence of other specified parts of digestive tract: Secondary | ICD-10-CM

## 2016-04-27 DIAGNOSIS — K219 Gastro-esophageal reflux disease without esophagitis: Secondary | ICD-10-CM | POA: Diagnosis present

## 2016-04-27 DIAGNOSIS — I152 Hypertension secondary to endocrine disorders: Secondary | ICD-10-CM | POA: Diagnosis present

## 2016-04-27 DIAGNOSIS — E785 Hyperlipidemia, unspecified: Secondary | ICD-10-CM | POA: Diagnosis present

## 2016-04-27 DIAGNOSIS — K56609 Unspecified intestinal obstruction, unspecified as to partial versus complete obstruction: Secondary | ICD-10-CM | POA: Diagnosis present

## 2016-04-27 DIAGNOSIS — Z8249 Family history of ischemic heart disease and other diseases of the circulatory system: Secondary | ICD-10-CM | POA: Diagnosis not present

## 2016-04-27 DIAGNOSIS — F32A Depression, unspecified: Secondary | ICD-10-CM | POA: Diagnosis present

## 2016-04-27 DIAGNOSIS — Z818 Family history of other mental and behavioral disorders: Secondary | ICD-10-CM | POA: Diagnosis not present

## 2016-04-27 DIAGNOSIS — E669 Obesity, unspecified: Secondary | ICD-10-CM | POA: Diagnosis present

## 2016-04-27 DIAGNOSIS — Z820 Family history of epilepsy and other diseases of the nervous system: Secondary | ICD-10-CM | POA: Diagnosis not present

## 2016-04-27 DIAGNOSIS — E1165 Type 2 diabetes mellitus with hyperglycemia: Secondary | ICD-10-CM | POA: Diagnosis present

## 2016-04-27 DIAGNOSIS — F411 Generalized anxiety disorder: Secondary | ICD-10-CM | POA: Diagnosis present

## 2016-04-27 DIAGNOSIS — Z7982 Long term (current) use of aspirin: Secondary | ICD-10-CM

## 2016-04-27 DIAGNOSIS — R109 Unspecified abdominal pain: Secondary | ICD-10-CM | POA: Diagnosis present

## 2016-04-27 HISTORY — DX: Unspecified intestinal obstruction, unspecified as to partial versus complete obstruction: K56.609

## 2016-04-27 LAB — COMPREHENSIVE METABOLIC PANEL
ALBUMIN: 4.9 g/dL (ref 3.5–5.0)
ALT: 16 U/L (ref 14–54)
ANION GAP: 15 (ref 5–15)
AST: 24 U/L (ref 15–41)
Alkaline Phosphatase: 53 U/L (ref 38–126)
BUN: 17 mg/dL (ref 6–20)
CO2: 19 mmol/L — AB (ref 22–32)
Calcium: 10.1 mg/dL (ref 8.9–10.3)
Chloride: 102 mmol/L (ref 101–111)
Creatinine, Ser: 1.04 mg/dL — ABNORMAL HIGH (ref 0.44–1.00)
GFR calc Af Amer: 60 mL/min — ABNORMAL LOW (ref >60)
GFR calc non Af Amer: 52 mL/min — ABNORMAL LOW (ref >60)
GLUCOSE: 149 mg/dL — AB (ref 65–99)
POTASSIUM: 4.6 mmol/L (ref 3.5–5.1)
Sodium: 136 mmol/L (ref 135–145)
TOTAL PROTEIN: 8 g/dL (ref 6.5–8.1)
Total Bilirubin: 0.6 mg/dL (ref 0.3–1.2)

## 2016-04-27 LAB — URINALYSIS, ROUTINE W REFLEX MICROSCOPIC
Bilirubin Urine: NEGATIVE
GLUCOSE, UA: NEGATIVE mg/dL
Hgb urine dipstick: NEGATIVE
Ketones, ur: NEGATIVE mg/dL
LEUKOCYTES UA: NEGATIVE
Nitrite: NEGATIVE
PH: 5 (ref 5.0–8.0)
PROTEIN: NEGATIVE mg/dL
Specific Gravity, Urine: 1.01 (ref 1.005–1.030)

## 2016-04-27 LAB — CBC WITH DIFFERENTIAL/PLATELET
BASOS ABS: 0.1 10*3/uL (ref 0.0–0.1)
BASOS PCT: 1 %
Eosinophils Absolute: 0.3 10*3/uL (ref 0.0–0.7)
Eosinophils Relative: 1 %
HEMATOCRIT: 47.9 % — AB (ref 36.0–46.0)
HEMOGLOBIN: 15.9 g/dL — AB (ref 12.0–15.0)
LYMPHS PCT: 22 %
Lymphs Abs: 4 10*3/uL (ref 0.7–4.0)
MCH: 32.1 pg (ref 26.0–34.0)
MCHC: 33.2 g/dL (ref 30.0–36.0)
MCV: 96.6 fL (ref 78.0–100.0)
Monocytes Absolute: 1.2 10*3/uL — ABNORMAL HIGH (ref 0.1–1.0)
Monocytes Relative: 7 %
NEUTROS ABS: 12.4 10*3/uL — AB (ref 1.7–7.7)
NEUTROS PCT: 69 %
Platelets: 253 10*3/uL (ref 150–400)
RBC: 4.96 MIL/uL (ref 3.87–5.11)
RDW: 14.2 % (ref 11.5–15.5)
WBC: 17.9 10*3/uL — ABNORMAL HIGH (ref 4.0–10.5)

## 2016-04-27 LAB — PROTIME-INR
INR: 1.01 (ref 0.00–1.49)
Prothrombin Time: 13.5 seconds (ref 11.6–15.2)

## 2016-04-27 LAB — GLUCOSE, CAPILLARY: Glucose-Capillary: 148 mg/dL — ABNORMAL HIGH (ref 65–99)

## 2016-04-27 LAB — LIPASE, BLOOD: Lipase: 35 U/L (ref 11–51)

## 2016-04-27 LAB — I-STAT CG4 LACTIC ACID, ED
LACTIC ACID, VENOUS: 3.99 mmol/L — AB (ref 0.5–2.0)
Lactic Acid, Venous: 5.71 mmol/L (ref 0.5–2.0)

## 2016-04-27 LAB — APTT: APTT: 27 s (ref 24–37)

## 2016-04-27 MED ORDER — HEPARIN SODIUM (PORCINE) 5000 UNIT/ML IJ SOLN
5000.0000 [IU] | Freq: Three times a day (TID) | INTRAMUSCULAR | Status: DC
  Administered 2016-04-27 – 2016-04-30 (×9): 5000 [IU] via SUBCUTANEOUS
  Filled 2016-04-27 (×10): qty 1

## 2016-04-27 MED ORDER — HYDROMORPHONE HCL 1 MG/ML IJ SOLN
1.0000 mg | INTRAMUSCULAR | Status: DC | PRN
  Administered 2016-04-27 – 2016-04-28 (×4): 1 mg via INTRAVENOUS
  Filled 2016-04-27 (×4): qty 1

## 2016-04-27 MED ORDER — HYDROMORPHONE HCL 1 MG/ML IJ SOLN
0.5000 mg | INTRAMUSCULAR | Status: DC | PRN
  Filled 2016-04-27: qty 1

## 2016-04-27 MED ORDER — SODIUM CHLORIDE 0.9 % IV BOLUS (SEPSIS)
1000.0000 mL | Freq: Once | INTRAVENOUS | Status: AC
  Administered 2016-04-27: 1000 mL via INTRAVENOUS

## 2016-04-27 MED ORDER — SODIUM CHLORIDE 0.9 % IV SOLN
Freq: Once | INTRAVENOUS | Status: AC
  Administered 2016-04-27: 13:00:00 via INTRAVENOUS

## 2016-04-27 MED ORDER — HYDRALAZINE HCL 20 MG/ML IJ SOLN: 10.0000 mg | INTRAMUSCULAR | Status: DC | PRN

## 2016-04-27 MED ORDER — MORPHINE SULFATE (PF) 2 MG/ML IV SOLN
4.0000 mg | INTRAVENOUS | Status: AC | PRN
  Administered 2016-04-27 (×2): 4 mg via INTRAVENOUS
  Filled 2016-04-27 (×2): qty 2

## 2016-04-27 MED ORDER — CITALOPRAM HYDROBROMIDE 20 MG PO TABS
40.0000 mg | ORAL_TABLET | Freq: Every day | ORAL | Status: DC
  Administered 2016-04-28 – 2016-05-01 (×4): 40 mg via ORAL
  Filled 2016-04-27 (×4): qty 2

## 2016-04-27 MED ORDER — ONDANSETRON HCL 4 MG/2ML IJ SOLN: 4.0000 mg | Freq: Four times a day (QID) | INTRAMUSCULAR | Status: DC | PRN

## 2016-04-27 MED ORDER — ONDANSETRON HCL 4 MG/2ML IJ SOLN
4.0000 mg | Freq: Once | INTRAMUSCULAR | Status: AC
  Administered 2016-04-27: 4 mg via INTRAVENOUS
  Filled 2016-04-27: qty 2

## 2016-04-27 MED ORDER — PANTOPRAZOLE SODIUM 40 MG IV SOLR
40.0000 mg | INTRAVENOUS | Status: DC
  Administered 2016-04-27 – 2016-04-30 (×4): 40 mg via INTRAVENOUS
  Filled 2016-04-27 (×4): qty 40

## 2016-04-27 MED ORDER — ONDANSETRON HCL 4 MG/2ML IJ SOLN
4.0000 mg | Freq: Three times a day (TID) | INTRAMUSCULAR | Status: DC | PRN
Start: 2016-04-27 — End: 2016-04-27

## 2016-04-27 MED ORDER — METRONIDAZOLE IN NACL 5-0.79 MG/ML-% IV SOLN
500.0000 mg | Freq: Once | INTRAVENOUS | Status: AC
  Administered 2016-04-27: 500 mg via INTRAVENOUS
  Filled 2016-04-27: qty 100

## 2016-04-27 MED ORDER — SODIUM CHLORIDE 0.9 % IV SOLN
INTRAVENOUS | Status: AC
  Administered 2016-04-27 – 2016-04-28 (×2): via INTRAVENOUS

## 2016-04-27 MED ORDER — INSULIN ASPART 100 UNIT/ML ~~LOC~~ SOLN
0.0000 [IU] | SUBCUTANEOUS | Status: DC
  Administered 2016-04-28 – 2016-05-01 (×5): 1 [IU] via SUBCUTANEOUS

## 2016-04-27 MED ORDER — DEXTROSE 5 % IV SOLN
2.0000 g | Freq: Once | INTRAVENOUS | Status: AC
  Administered 2016-04-27: 2 g via INTRAVENOUS
  Filled 2016-04-27: qty 2

## 2016-04-27 MED ORDER — ONDANSETRON HCL 4 MG PO TABS: 4.0000 mg | ORAL_TABLET | Freq: Four times a day (QID) | ORAL | Status: DC | PRN

## 2016-04-27 MED ORDER — SIMVASTATIN 20 MG PO TABS
40.0000 mg | ORAL_TABLET | Freq: Every day | ORAL | Status: DC
  Administered 2016-04-28 – 2016-05-01 (×4): 40 mg via ORAL
  Filled 2016-04-27 (×4): qty 2

## 2016-04-27 MED ORDER — IOPAMIDOL (ISOVUE-300) INJECTION 61%
100.0000 mL | Freq: Once | INTRAVENOUS | Status: AC | PRN
  Administered 2016-04-27: 100 mL via INTRAVENOUS

## 2016-04-27 NOTE — ED Notes (Signed)
Pt reports severe abd pain that started around 0830 this morning.  Reports history of bowel obstruction.  Denies vomiting or diarrhea. Reports nausea. LBM was yesterday.

## 2016-04-27 NOTE — ED Notes (Signed)
Pt has bruising to right hand from lactic acid draw, coban applied and ice applied to pt right hand.

## 2016-04-27 NOTE — H&P (Signed)
History and Physical    [name] S Sneath ZOX:[id number] DOB: 08-13-1941 DOA: 04/27/2016  PCP: Wilson SingerGOSRANI,NIMISH C, MD   Patient coming from: Home  Chief Complaint: Abdominal pain, nasuea  HPI: [name] [name] is a 75 y.o. 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 Patient reports that she had been in her usual state until about 3 days ago when she developed some loose stools. There was no abdominal pain, nausea, or distention, however. Upon waking this morning, patient had severe pain in the abdomen with nausea. Since that time, she has not attempted to eat and denies passing flatus since yesterday. She denies any fevers, chills, dysuria, or increased urinary frequency or urgency. She describes her symptoms as reminiscent of prior SBO's. She has history of remote abdominal hysterectomy. She did not try any interventions on her own prior to coming to the ED.  ED Course: Upon arrival to the ED, patient is found to be afebrile, saturating well on room air, and with vital signs stable. EKG features a sinus rhythm with low voltage QRS. CT of the abdomen is obtained and consistent with a small bowel obstruction with transition in the left midabdomen. There is no free fluid or air noted on the scan. CMP features a mildly depressed serum bicarbonate and mild hyperglycemia. CBC is notable for a leukocytosis to 17,900 and hemoglobin of 15.9. Lactic acid is elevated to 5.71, lipase is normal, and urinalysis is unremarkable. A bolus of 2 L normal saline was administered and symptomatic care was provided with morphine and Zofran. NGT was placed for decompression. Cefepime and Flagyl was initiated empirically in the emergency department and the EDP consulted with general surgery. Admission to the medicine service was  advised.  Review of Systems:  All other systems reviewed and apart from HPI, are negative.  Past Medical History  Diagnosis Date  . Genital herpes   . GERD (gastroesophageal reflux disease)   . Hyperlipidemia   . Hypertension   . Diabetes mellitus without complication (HCC)   . Anxiety   . Recurrent UTI   . Herpes 12/22/2015  . Urinary frequency 01/04/2016  . Burning with urination 01/04/2016  . Pelvic pressure in female 01/04/2016  . Hematuria 01/04/2016  . LLQ abdominal tenderness 01/04/2016  . Bowel obstruction [name](HCC)     Past Surgical History  Procedure Laterality Date  . Abdominal hysterectomy    . Colon surgery      blocked colon  . Bowel obstruction       reports that she has never smoked. Her smokeless tobacco use includes Snuff. She reports that she does not drink alcohol or use illicit drugs.  Allergies  Allergen Reactions  . Penicillins Nausea Only    Has patient had a PCN reaction causing immediate rash, facial/tongue/throat swelling, SOB or lightheadedness with hypotension: Yes Has patient had a PCN reaction causing severe rash involving mucus membranes or skin necrosis: No Has patient had a PCN reaction that required hospitalization No Has patient had a PCN reaction occurring within the last 10 years: No If all of the above answers are "NO", then may proceed with Cephalosporin use.   . Sulfa Antibiotics Nausea And Vomiting    Family History  Problem Relation Age of Onset  . Diabetes Mother   . Heart disease Brother   . Hypertension Brother   .  Diabetes Brother   . Hypertension Daughter   . Diabetes Brother   . Diabetes Brother   . Alzheimer's disease Brother   . Anxiety disorder Daughter      Prior to Admission medications   Medication Sig Start Date End Date Taking? Authorizing Provider  aspirin EC 81 MG tablet Take 81 mg by mouth daily.   Yes Historical Provider, MD  citalopram (CELEXA) 40 MG tablet Take 40 mg by mouth daily.   Yes Historical  Provider, MD  lisinopril (PRINIVIL,ZESTRIL) 2.5 MG tablet Take 2.5 mg by mouth daily.   Yes Historical Provider, MD  metFORMIN (GLUCOPHAGE) 500 MG tablet Take 500 mg by mouth as needed.   Yes Historical Provider, MD  omeprazole (PRILOSEC) 40 MG capsule Take 40 mg by mouth daily.   Yes Historical Provider, MD  simvastatin (ZOCOR) 40 MG tablet Take 40 mg by mouth daily.   Yes Historical Provider, MD  traMADol (ULTRAM) 50 MG tablet Take 50 mg by mouth every 6 (six) hours as needed for moderate pain.   Yes Historical Provider, MD  pregabalin (LYRICA) 50 MG capsule Take 1 capsule (50 mg total) by mouth daily. 04/18/16   Vickki Hearing, MD    Physical Exam: Filed Vitals:   04/27/16 1500 04/27/16 1545 04/27/16 1551 04/27/16 1642  BP: 141/71   147/82  Pulse: 73 71 73 71  Temp:    98.1 F (36.7 C)  TempSrc:    Oral  Resp: 20 18 13    Height:    5' (1.524 m)  Weight:    84.454 kg (186 lb 3 oz)  SpO2: 97% 97% 97% 100%      Constitutional: NAD, calm, comfortable Eyes: PERTLA, lids and conjunctivae normal ENMT: Mucous membranes are moist. Posterior pharynx clear of any exudate or lesions.   Neck: normal, supple, no masses, no thyromegaly Respiratory: clear to auscultation bilaterally, no wheezing, no crackles. Normal respiratory effort.   Cardiovascular: S1 & S2 heard, regular rate and rhythm, no significant murmurs / rubs / gallops. No extremity edema. No significant JVD. Abdomen: Mild distension, mildly tender throughout, no masses palpated. Bowel sounds active.  Musculoskeletal: no clubbing / cyanosis. No joint deformity upper and lower extremities. Normal muscle tone.  Skin: no significant rashes, lesions, ulcers. Warm, dry, well-perfused. Neurologic: CN 2-12 grossly intact. Sensation intact, DTR normal. Strength 5/5 in all 4 limbs.  Psychiatric: Normal judgment and insight. Alert and oriented x 3. Normal mood and affect.     Labs on Admission: I have personally reviewed following labs  and imaging studies  CBC:  Recent Labs Lab 04/27/16 1228  WBC 17.9*  NEUTROABS 12.4*  HGB 15.9*  HCT 47.9*  MCV 96.6  PLT 253   Basic Metabolic Panel:  Recent Labs Lab 04/27/16 1228  NA 136  K 4.6  CL 102  CO2 19*  GLUCOSE 149*  BUN 17  CREATININE 1.04*  CALCIUM 10.1   GFR: Estimated Creatinine Clearance: 45.8 mL/min (by C-G formula based on Cr of 1.04). Liver Function Tests:  Recent Labs Lab 04/27/16 1228  AST 24  ALT 16  ALKPHOS 53  BILITOT 0.6  PROT 8.0  ALBUMIN 4.9    Recent Labs Lab 04/27/16 1228  LIPASE 35   No results for input(s): AMMONIA in the last 168 hours. Coagulation Profile: No results for input(s): INR, PROTIME in the last 168 hours. Cardiac Enzymes: No results for input(s): CKTOTAL, CKMB, CKMBINDEX, TROPONINI in the last 168 hours. BNP (last 3 results) No  results for input(s): PROBNP in the last 8760 hours. HbA1C: No results for input(s): HGBA1C in the last 72 hours. CBG: No results for input(s): GLUCAP in the last 168 hours. Lipid Profile: No results for input(s): CHOL, HDL, LDLCALC, TRIG, CHOLHDL, LDLDIRECT in the last 72 hours. Thyroid Function Tests: No results for input(s): TSH, T4TOTAL, FREET4, T3FREE, THYROIDAB in the last 72 hours. Anemia Panel: No results for input(s): VITAMINB12, FOLATE, FERRITIN, TIBC, IRON, RETICCTPCT in the last 72 hours. Urine analysis:    Component Value Date/Time   COLORURINE YELLOW 04/27/2016 1530   APPEARANCEUR CLEAR 04/27/2016 1530   APPEARANCEUR Clear 01/04/2016 1130   LABSPEC 1.010 04/27/2016 1530   PHURINE 5.0 04/27/2016 1530   GLUCOSEU NEGATIVE 04/27/2016 1530   HGBUR NEGATIVE 04/27/2016 1530   BILIRUBINUR NEGATIVE 04/27/2016 1530   BILIRUBINUR Negative 01/04/2016 1130   KETONESUR NEGATIVE 04/27/2016 1530   PROTEINUR NEGATIVE 04/27/2016 1530   PROTEINUR Negative 01/04/2016 1130   PROTEINUR neg 01/04/2016 1127   UROBILINOGEN 0.2 07/29/2007 1347   NITRITE NEGATIVE 04/27/2016 1530     NITRITE Negative 01/04/2016 1130   NITRITE neg 01/04/2016 1127   LEUKOCYTESUR NEGATIVE 04/27/2016 1530   LEUKOCYTESUR Negative 01/04/2016 1130   Sepsis Labs: (procalcitonin:4,lacticidven:4) )No results found for this or any previous visit (from the past 240 hour(s)).   Radiological Exams on Admission: Dg Abd 1 View  04/27/2016  CLINICAL DATA:  Nasogastric tube placement. EXAM: ABDOMEN - 1 VIEW COMPARISON:  None. FINDINGS: The bowel gas pattern is normal. No radio-opaque calculi or other significant radiographic abnormality are seen. Nasogastric tube tip is seen in proximal stomach. IMPRESSION: Nasogastric tube tip seen in proximal stomach. No evidence of bowel obstruction or ileus. Electronically Signed   By: Lupita Raider, M.D.   On: 04/27/2016 16:07   Ct Abdomen Pelvis W Contrast  04/27/2016  CLINICAL DATA:  Severe abdominal pain. History of bowel obstruction. No vomiting or diarrhea. EXAM: CT ABDOMEN AND PELVIS WITH CONTRAST TECHNIQUE: Multidetector CT imaging of the abdomen and pelvis was performed using the standard protocol following bolus administration of intravenous contrast. CONTRAST:  ISOVUE-300 IOPAMIDOL (ISOVUE-300) INJECTION 61% COMPARISON:  None. FINDINGS: Lower chest:  Clear lung bases.  Normal heart size. Hepatobiliary: Normal liver. Nonvisualized gallbladder likely secondary to prior cholecystectomy. Pancreas: Normal. Spleen: Normal. Adrenals/Urinary Tract: Normal adrenal glands. Normal kidneys. Extrarenal pelvis on the right. No urolithiasis. Normal bladder. Stomach/Bowel: Multiple dilated loops of small bowel with the most severely dilated loop in the pelvis measuring 4.1 cm in diameter with fecal material within the small bowel with relative transition point in the left mid abdomen which may be secondary to adhesions. No pneumatosis, pneumoperitoneum or portal venous gas. No abdominal or pelvic free fluid. Vascular/Lymphatic: Normal caliber abdominal aorta. No  lymphadenopathy. Reproductive: No adnexal mass.  Prior hysterectomy. Other: No fluid collection or hematoma. Musculoskeletal: No acute osseous abnormality. 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 2 mm of retrolisthesis of L2 on L3 and L3 on L4. Bilateral facet arthropathy at L3-4, L4-5 and L5-S1. IMPRESSION: 1. Small bowel obstruction with the most severely dilated loop in the pelvis measuring 4.1 cm in diameter with fecal material within the small bowel with relative transition point in the left mid abdomen which may be secondary to adhesions. 2. Lumbar spine spondylosis. Electronically Signed   By: Elige Ko   On: 04/27/2016 14:50    EKG: Independently reviewed. Sinus rhythm, low-voltage QRS  Assessment/Plan  1. SBO, recurrent -  CT evidence of SBO with likely transition point in left mid-abdomen  - Likely secondary to adhesions  - Bowel sounds remain active on admission; last BM 04/26/16, reports no flatus today  - Gen surgery consulted and much appreciated  - NPO for now  - NGT decompression  - Pain-control prn    2. Dehydration - Secondary to poor PO intake in setting of SBO  - 2 L NS bolused in ED, continue IV hydration with NS at 100 cc/hr  - Monitor fluid status, electrolytes, intervene further prn    3. GERD - Stable  - Managed with Prilosec at home - Continue PPI with Protonix while in hospital, IV while NPO   4. Type II DM  - Managed with metformin at home  - A1c was 6.5% in February 2017, reflecting good glycemic-control  - Hold metformin while in hospital  - Check CBG q4h while NPO - Low-intensity SSI prn    5. Hypertension  - At goal currently  - Managed with lisinopril at home  - Treat with IV hydralazine prn for now   6. Depression, anxiety  - Stable, denies SI, HI, or hallucinations  - Continue current-dose Celexa   7. Polycythemia, relative   - Hgb 15.9, Hct 47.9 on admission  - Likely secondary to dehydration  -  Repeat CBC in am     DVT prophylaxis: sq heparin  Code Status: Full  Family Communication: Discussed with patient  Disposition Plan: Admit to med/surg  Consults called: Gen surgery  Admission status: Inpatient    Briscoe Deutscher, MD [facility] Pager [phone]  If 7PM-7AM, please contact night-coverage [email or web address] Password [facility]  04/27/2016, 5:34 PM

## 2016-04-27 NOTE — ED Notes (Signed)
Admitting doctor at bedside 

## 2016-04-27 NOTE — Consult Note (Signed)
SURGICAL CONSULTATION NOTE (initial)  HISTORY OF PRESENT ILLNESS (HPI):  75 y.o. female presented to [facility] Emergency Department this afternoon with severe abdominal pain and nausea that began after breakfast this morning (without emesis). Despite the pain and nausea, patient reports that she took her usual daily Miralax. She denies any flatus since yesterday and has had looser-than-usual bowel movements x 3 days. Patient reports that, though still uncomfortable, the pain has improved since NG tube placement, and she denies fever or chills. She also recalls that her current symptoms are reminiscent of her prior SBO's, most recently 2 years ago that resolved with nasogastric decompression. 10 years ago, however, she required bowel resection for ischemia.  PAST MEDICAL HISTORY (PMH):  Past Medical History  Diagnosis Date  . Genital herpes   . GERD (gastroesophageal reflux disease)   . Hyperlipidemia   . Hypertension   . Diabetes mellitus without complication (HCC)   . Anxiety   . Recurrent UTI   . Herpes 12/22/2015  . Urinary frequency 01/04/2016  . Burning with urination 01/04/2016  . Pelvic pressure in female 01/04/2016  . Hematuria 01/04/2016  . LLQ abdominal tenderness 01/04/2016  . Bowel obstruction (HCC)      PAST SURGICAL HISTORY (PSH):  Past Surgical History  Procedure Laterality Date  . Abdominal hysterectomy with colostomy  1984  . Colostomy reversal    . Small bowel resection for SBO-associated ischemia  >10 years ago     MEDICATIONS:  Prior to Admission medications   Medication Sig Start Date End Date Taking? Authorizing Provider  aspirin EC 81 MG tablet Take 81 mg by mouth daily.   Yes Historical Provider, MD  citalopram (CELEXA) 40 MG tablet Take 40 mg by mouth daily.   Yes Historical Provider, MD  lisinopril (PRINIVIL,ZESTRIL) 2.5 MG tablet Take 2.5 mg by mouth daily.   Yes Historical Provider, MD  metFORMIN (GLUCOPHAGE) 500 MG tablet Take 500 mg by mouth as  needed.   Yes Historical Provider, MD  omeprazole (PRILOSEC) 40 MG capsule Take 40 mg by mouth daily.   Yes Historical Provider, MD  simvastatin (ZOCOR) 40 MG tablet Take 40 mg by mouth daily.   Yes Historical Provider, MD  traMADol (ULTRAM) 50 MG tablet Take 50 mg by mouth every 6 (six) hours as needed for moderate pain.   Yes Historical Provider, MD  pregabalin (LYRICA) 50 MG capsule Take 1 capsule (50 mg total) by mouth daily. 04/18/16   Vickki Hearing, MD     ALLERGIES:  Allergies  Allergen Reactions  . Penicillins Nausea Only    Has patient had a PCN reaction causing immediate rash, facial/tongue/throat swelling, SOB or lightheadedness with hypotension: Yes Has patient had a PCN reaction causing severe rash involving mucus membranes or skin necrosis: No Has patient had a PCN reaction that required hospitalization No Has patient had a PCN reaction occurring within the last 10 years: No If all of the above answers are "NO", then may proceed with Cephalosporin use.   . Sulfa Antibiotics Nausea And Vomiting     SOCIAL HISTORY:  Social History   Social History  . Marital Status: Legally Separated    Spouse Name: N/A  . Number of Children: N/A  . Years of Education: N/A   Occupational History  . Not on file.   Social History Main Topics  . Smoking status: Never Smoker   . Smokeless tobacco: Current User    Types: Snuff  . Alcohol Use: No  . Drug  Use: No  . Sexual Activity: No     Comment: hyst   Other Topics Concern  . Not on file   Social History Narrative    The patient currently resides (home / rehab facility / nursing home): lives with her son  The patient normally is (ambulatory / bedbound): ambulatory   FAMILY HISTORY:  Family History  Problem Relation Age of Onset  . Diabetes Mother   . Heart disease Brother   . Hypertension Brother   . Diabetes Brother   . Hypertension Daughter   . Diabetes Brother   . Diabetes Brother   . Alzheimer's disease  Brother   . Anxiety disorder Daughter     REVIEW OF SYSTEMS:  Constitutional: denies any other weight loss, fever, chills, or sweats  Eyes: denies any other vision changes, history of eye injury  ENT: denies sore throat except since NGT placement, hearing problems  Respiratory: denies shortness of breath, wheezing  Cardiovascular: denies chest pain, palpitations  Gastrointestinal: abdominal pain and nausea as per HPI (still present, but somewhat improved s/p NGT) Musculoskeletal: denies any other joint pains or cramps  Skin: Denies any other rashes or skin discolorations  Neurological: denies any other headache, dizziness, weakness  Psychiatric: denies any other depression, anxiety   All other review of systems were negative   VITAL SIGNS:  Temp:  [97.4 F (36.3 C)-98.1 F (36.7 C)] 98.1 F (36.7 C) May 13, 2023 1642) Pulse Rate:  [70-91] 71 05/13/2023 1642) Resp:  [13-24] 13 05-13-23 1551) BP: (131-149)/(62-87) 147/82 mmHg 05/13/2023 1642) SpO2:  [97 %-100 %] 100 % 05/13/23 1642) Weight:  [81.647 kg (180 lb)-84.454 kg (186 lb 3 oz)] 84.454 kg (186 lb 3 oz) 05-13-23 1642)     Height: 5' (152.4 cm) Weight: 84.454 kg (186 lb 3 oz) BMI (Calculated): 36.4   INTAKE/OUTPUT:  This shift:    Last 2 shifts: @   PHYSICAL EXAM:  Constitutional:  -- Obese  -- Awake, alert, and oriented x3 -- Has difficulty hearing and left hearing aides at home Eyes:  -- Pupils equally round and reactive to light  -- No scleral icterus  Ear, nose, and throat:  -- No jugular venous distension  Pulmonary:  -- No crackles  -- Equal breath sounds bilaterally  Cardiovascular:  -- S1, S2 present  -- No pericardial rubs Abdomen:  -- Soft, obese, nondistended, some guarding without rebound -- Well-healed midline laparotomy scar (hysterectomy), right of midline scar (bowel resection), and colostomy scar -- No pulsatile abdominal mass appreciated  Musculoskeletal / Integumentary:  -- Extremities: B/L UE and  LE FROM, hands and feet warm, no pitting edema  Neurologic:  -- Motor function: intact and symmetric  -- Sensation: intact and symmetric    Labs:  CBC:  Lab Results  Component Value Date   WBC 17.9* 2016-05-12   RBC 4.96 05/12/2016   BMP:  Lab Results  Component Value Date   GLUCOSE 149* 2016-05-12   CO2 19* May 12, 2016   BUN 17 May 12, 2016   CREATININE 1.04* 12-May-2016   CALCIUM 10.1 05/12/16    Imaging studies:  CT Abdomen and Pelvis (05-12-16) personally reviewed IMPRESSION: 1. Small bowel obstruction with the most severely dilated loop in the pelvis measuring 4.1 cm in diameter with fecal material within the small bowel with relative transition point in the left mid-abdomen which may be secondary to adhesions. 2. Lumbar spine spondylosis.  Assessment/Plan:  75 y.o. female with recurrent small bowel obstruction, complicated by pertinent comorbidities including diabetes mellitus, hypertension,  and hyperlipidemia.  - NPO - IV fluid hydration  - NGT for gastrointestinal decompression - serial abdominal exams, will follow - DVT prophylaxis  All of the above findings and recommendations were discussed with the patient, who expressed understanding and whose questions were answered to her expressed satisfaction.  Thank you for the opportunity to participate in the care for this patient.   -- Scherrie GerlachJason E. Earlene Plateravis, MD [place]: [facility] Office: [phone]

## 2016-04-27 NOTE — ED Provider Notes (Addendum)
CSN: [id number]     Arrival date & time 04/27/16  1212 History   First MD Initiated Contact with Patient 04/27/16 1223     Chief Complaint  Patient presents with  . Abdominal Pain     (Consider location/radiation/quality/duration/timing/severity/associated sxs/prior Treatment) HPI 75 year old female who presents with abdominal pain. She has a history of abdominal hysterectomy and history of small bowel obstruction. States that she awoke this morning with severe sharp intermittent abdominal pain. Pain in the center of her abdomen associated with nausea. No vomiting, fevers, chills or night sweats. Has been having loose stools over the past few days, and has been taking MiraLAX. States that she continues to pass gas but this is explosive in nature over the past few days. No chest pain or difficulty breathing, melena or hematochezia, dysuria or urinary frequency. States this is similar to prior episodes of bowel obstruction.   Past Medical History  Diagnosis Date  . Genital herpes   . GERD (gastroesophageal reflux disease)   . Hyperlipidemia   . Hypertension   . Diabetes mellitus without complication (HCC)   . Anxiety   . Recurrent UTI   . Herpes 12/22/2015  . Urinary frequency 01/04/2016  . Burning with urination 01/04/2016  . Pelvic pressure in female 01/04/2016  . Hematuria 01/04/2016  . LLQ abdominal tenderness 01/04/2016  . Bowel obstruction [facility](HCC)    Past Surgical History  Procedure Laterality Date  . Abdominal hysterectomy    . Colon surgery      blocked colon  . Bowel obstruction     Family History  Problem Relation Age of Onset  . Diabetes Mother   . Heart disease Brother   . Hypertension Brother   . Diabetes Brother   . Hypertension Daughter   . Diabetes Brother   . Diabetes Brother   . Alzheimer's disease Brother   . Anxiety disorder Daughter    Social History  Substance Use Topics  . Smoking status: Never Smoker   . Smokeless tobacco: Current User    Types:  Snuff  . Alcohol Use: No   OB History    Gravida Para Term Preterm AB TAB SAB Ectopic Multiple Living   3 3        3      Review of Systems 10/14 systems reviewed and are negative other than those stated in the HPI   Allergies  Penicillins and Sulfa antibiotics  Home Medications   Prior to Admission medications   Medication Sig Start Date End Date Taking? Authorizing Provider  aspirin EC 81 MG tablet Take 81 mg by mouth daily.   Yes Historical Provider, MD  citalopram (CELEXA) 40 MG tablet Take 40 mg by mouth daily.   Yes Historical Provider, MD  lisinopril (PRINIVIL,ZESTRIL) 2.5 MG tablet Take 2.5 mg by mouth daily.   Yes Historical Provider, MD  metFORMIN (GLUCOPHAGE) 500 MG tablet Take 500 mg by mouth as needed.   Yes Historical Provider, MD  omeprazole (PRILOSEC) 40 MG capsule Take 40 mg by mouth daily.   Yes Historical Provider, MD  simvastatin (ZOCOR) 40 MG tablet Take 40 mg by mouth daily.   Yes Historical Provider, MD  traMADol (ULTRAM) 50 MG tablet Take 50 mg by mouth every 6 (six) hours as needed for moderate pain.   Yes Historical Provider, MD  pregabalin (LYRICA) 50 MG capsule Take 1 capsule (50 mg total) by mouth daily. 04/18/16   Vickki HearingStanley E Harrison, MD   BP 141/71 mmHg  Pulse 71  Temp(Src) 97.4 F (36.3 C) (Oral)  Resp 18  Ht 5' (1.524 m)  Wt 180 lb (81.647 kg)  BMI 35.15 kg/m2  SpO2 97% Physical Exam Physical Exam  Nursing note and vitals reviewed. Constitutional: appears to be in significant pain and unwell, but non-toxic Head: Normocephalic and atraumatic.  Mouth/Throat: Oropharynx is clear and moist.  Neck: Normal range of motion. Neck supple.  Cardiovascular: Normal rate and regular rhythm.   Pulmonary/Chest: Effort normal and breath sounds normal.  Abdominal: Soft. Mild distension there is diffuse tenderness, worse in the low abdomen. There is no rebound or guarding Musculoskeletal: Normal range of motion.  Neurological: Alert, no facial droop, fluent  speech, moves all extremities symmetrically Skin: Skin is warm and dry.  Psychiatric: Cooperative   ED Course  Procedures (including critical care time) Labs Review Labs Reviewed  CBC WITH DIFFERENTIAL/PLATELET - Abnormal; Notable for the following:    WBC 17.9 (*)    Hemoglobin 15.9 (*)    HCT 47.9 (*)    Neutro Abs 12.4 (*)    Monocytes Absolute 1.2 (*)    All other components within normal limits  COMPREHENSIVE METABOLIC PANEL - Abnormal; Notable for the following:    CO2 19 (*)    Glucose, Bld 149 (*)    Creatinine, Ser 1.04 (*)    GFR calc non Af Amer 52 (*)    GFR calc Af Amer 60 (*)    All other components within normal limits  I-STAT CG4 LACTIC ACID, ED - Abnormal; Notable for the following:    Lactic Acid, Venous 5.71 (*)    All other components within normal limits  LIPASE, BLOOD  URINALYSIS, ROUTINE W REFLEX MICROSCOPIC (NOT AT [facility])  I-STAT CG4 LACTIC ACID, ED  I-STAT CG4 LACTIC ACID, ED  I-STAT CG4 LACTIC ACID, ED    Imaging Review Ct Abdomen Pelvis W Contrast  04/27/2016  CLINICAL DATA:  Severe abdominal pain. History of bowel obstruction. No vomiting or diarrhea. EXAM: CT ABDOMEN AND PELVIS WITH CONTRAST TECHNIQUE: Multidetector CT imaging of the abdomen and pelvis was performed using the standard protocol following bolus administration of intravenous contrast. CONTRAST:  ISOVUE-300 IOPAMIDOL (ISOVUE-300) INJECTION 61% COMPARISON:  None. FINDINGS: Lower chest:  Clear lung bases.  Normal heart size. Hepatobiliary: Normal liver. Nonvisualized gallbladder likely secondary to prior cholecystectomy. Pancreas: Normal. Spleen: Normal. Adrenals/Urinary Tract: Normal adrenal glands. Normal kidneys. Extrarenal pelvis on the right. No urolithiasis. Normal bladder. Stomach/Bowel: Multiple dilated loops of small bowel with the most severely dilated loop in the pelvis measuring 4.1 cm in diameter with fecal material within the small bowel with relative transition point in the  left mid abdomen which may be secondary to adhesions. No pneumatosis, pneumoperitoneum or portal venous gas. No abdominal or pelvic free fluid. Vascular/Lymphatic: Normal caliber abdominal aorta. No lymphadenopathy. Reproductive: No adnexal mass.  Prior hysterectomy. Other: No fluid collection or hematoma. Musculoskeletal: No acute osseous abnormality. 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 2 mm of retrolisthesis of L2 on L3 and L3 on L4. Bilateral facet arthropathy at L3-4, L4-5 and L5-S1. IMPRESSION: 1. Small bowel obstruction with the most severely dilated loop in the pelvis measuring 4.1 cm in diameter with fecal material within the small bowel with relative transition point in the left mid abdomen which may be secondary to adhesions. 2. Lumbar spine spondylosis. Electronically Signed   By: Elige Ko   On: 04/27/2016 14:50   I have personally reviewed and evaluated  these images and lab results as part of my medical decision-making.   EKG Interpretation   Date/Time:  Wednesday Apr 27 2016 12:31:06 EDT Ventricular Rate:  77 PR Interval:  136 QRS Duration: 108 QT Interval:  436 QTC Calculation: 493 R Axis:   0 Text Interpretation:  Sinus rhythm Low voltage, extremity and precordial  leads No acute changes Confirmed by LIU MD, DANA [id number] on 04/27/2016  1:36:46 PM     CRITICAL CARE Performed by: Lavera Guise   Total critical care time: 30 minutes  Critical care time was exclusive of separately billable procedures and treating other patients.  Critical care was necessary to treat or prevent imminent or life-threatening deterioration.  Critical care was time spent personally by me on the following activities: development of treatment plan with patient and/or surrogate as well as nursing, discussions with consultants, evaluation of patient's response to treatment, examination of patient, obtaining history from patient or surrogate, ordering and  performing treatments and interventions, ordering and review of laboratory studies, ordering and review of radiographic studies, pulse oximetry and re-evaluation of patient's condition.  MDM   Final diagnoses:  Small bowel obstruction (HCC)   75 year old female with history of abdominal hysterectomy, DM, HTN, HLD, and h/o SBO who presents with abd pain and vomiting. Vital signs stable in ED. With exquisite and diffuse abdominal pain, but no peritoneal signs. Blood work c/f leukocytosis and elevated lactic acid 5.7. Empirically covered with cefepime and flagyl pending CT. CT showing SBO without bowel wall necrosis and transition point in the left mid abdomen. NG placed. discussed with Dr. Earlene Plater from surgery. Recommending continued IV hydrated (2L thus far and now on continuous) and he will evaluated once admitted.  Discussed with Dr. Antionette Char who will admit for ongoing management.    Lavera Guise, MD 04/27/16 1559  Lavera Guise, MD 04/27/16 [phone]

## 2016-04-28 DIAGNOSIS — K219 Gastro-esophageal reflux disease without esophagitis: Secondary | ICD-10-CM

## 2016-04-28 DIAGNOSIS — I10 Essential (primary) hypertension: Secondary | ICD-10-CM

## 2016-04-28 DIAGNOSIS — K5669 Other intestinal obstruction: Secondary | ICD-10-CM

## 2016-04-28 DIAGNOSIS — E119 Type 2 diabetes mellitus without complications: Secondary | ICD-10-CM

## 2016-04-28 DIAGNOSIS — E860 Dehydration: Secondary | ICD-10-CM

## 2016-04-28 LAB — GLUCOSE, CAPILLARY
GLUCOSE-CAPILLARY: 128 mg/dL — AB (ref 65–99)
GLUCOSE-CAPILLARY: 112 mg/dL — AB (ref 65–99)
GLUCOSE-CAPILLARY: 116 mg/dL — AB (ref 65–99)
Glucose-Capillary: 120 mg/dL — ABNORMAL HIGH (ref 65–99)
Glucose-Capillary: 124 mg/dL — ABNORMAL HIGH (ref 65–99)
Glucose-Capillary: 140 mg/dL — ABNORMAL HIGH (ref 65–99)

## 2016-04-28 LAB — CBC WITH DIFFERENTIAL/PLATELET
Basophils Absolute: 0.1 10*3/uL (ref 0.0–0.1)
Basophils Relative: 1 %
EOS PCT: 1 %
Eosinophils Absolute: 0.2 10*3/uL (ref 0.0–0.7)
HCT: 41.9 % (ref 36.0–46.0)
HEMOGLOBIN: 13.5 g/dL (ref 12.0–15.0)
LYMPHS ABS: 1.8 10*3/uL (ref 0.7–4.0)
LYMPHS PCT: 15 %
MCH: 31.9 pg (ref 26.0–34.0)
MCHC: 32.2 g/dL (ref 30.0–36.0)
MCV: 99.1 fL (ref 78.0–100.0)
Monocytes Absolute: 1.2 10*3/uL — ABNORMAL HIGH (ref 0.1–1.0)
Monocytes Relative: 11 %
NEUTROS PCT: 72 %
Neutro Abs: 8.4 10*3/uL — ABNORMAL HIGH (ref 1.7–7.7)
Platelets: 186 10*3/uL (ref 150–400)
RBC: 4.23 MIL/uL (ref 3.87–5.11)
RDW: 14.5 % (ref 11.5–15.5)
WBC: 11.6 10*3/uL — AB (ref 4.0–10.5)

## 2016-04-28 LAB — BASIC METABOLIC PANEL
Anion gap: 8 (ref 5–15)
BUN: 14 mg/dL (ref 6–20)
CHLORIDE: 106 mmol/L (ref 101–111)
CO2: 23 mmol/L (ref 22–32)
Calcium: 8.9 mg/dL (ref 8.9–10.3)
Creatinine, Ser: 0.85 mg/dL (ref 0.44–1.00)
GFR calc Af Amer: >60 mL/min (ref >60)
GFR calc non Af Amer: >60 mL/min (ref >60)
Glucose, Bld: 132 mg/dL — ABNORMAL HIGH (ref 65–99)
POTASSIUM: 4.1 mmol/L (ref 3.5–5.1)
Sodium: 137 mmol/L (ref 135–145)

## 2016-04-28 LAB — MRSA PCR SCREENING: MRSA by PCR: NEGATIVE

## 2016-04-28 MED ORDER — CETYLPYRIDINIUM CHLORIDE 0.05 % MT LIQD
7.0000 mL | Freq: Two times a day (BID) | OROMUCOSAL | Status: DC
  Administered 2016-04-28 – 2016-05-01 (×5): 7 mL via OROMUCOSAL

## 2016-04-28 MED ORDER — ACETAMINOPHEN 325 MG PO TABS
650.0000 mg | ORAL_TABLET | Freq: Four times a day (QID) | ORAL | Status: DC | PRN
  Administered 2016-04-28 – 2016-05-01 (×9): 650 mg via ORAL
  Filled 2016-04-28 (×10): qty 2

## 2016-04-28 MED ORDER — SODIUM CHLORIDE 0.9% FLUSH
3.0000 mL | INTRAVENOUS | Status: DC | PRN
  Administered 2016-04-28: 3 mL via INTRAVENOUS
  Filled 2016-04-28: qty 3

## 2016-04-28 MED ORDER — SODIUM CHLORIDE 0.9 % IV SOLN: 250.0000 mL | INTRAVENOUS | Status: DC | PRN

## 2016-04-28 MED ORDER — DIPHENHYDRAMINE HCL 50 MG/ML IJ SOLN
25.0000 mg | Freq: Four times a day (QID) | INTRAMUSCULAR | Status: DC | PRN
  Administered 2016-04-28 – 2016-04-29 (×2): 25 mg via INTRAVENOUS
  Filled 2016-04-28 (×2): qty 1

## 2016-04-28 MED ORDER — SODIUM CHLORIDE 0.9% FLUSH
3.0000 mL | Freq: Two times a day (BID) | INTRAVENOUS | Status: DC
  Administered 2016-04-28 – 2016-05-01 (×6): 3 mL via INTRAVENOUS

## 2016-04-28 MED ORDER — CHLORHEXIDINE GLUCONATE 0.12 % MT SOLN
15.0000 mL | Freq: Two times a day (BID) | OROMUCOSAL | Status: DC
  Administered 2016-04-28 – 2016-05-01 (×6): 15 mL via OROMUCOSAL
  Filled 2016-04-28 (×5): qty 15

## 2016-04-28 NOTE — Progress Notes (Signed)
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 Evaluation in the ED consisted of of CT A/P revealed SBO with transition in the left mid-abdomen. Leukocytosis and elevated lactic acid also noted. NGT placed for decompression and empiric abx started. EDP consulted general surgery and she has been referred for admission.    Assessment & Plan: Principal Problem:   Small bowel obstruction (HCC) Active Problems:   Dehydration   Essential hypertension   Non-insulin dependent type 2 diabetes mellitus (HCC)   Depression   Anxiety state   GERD (gastroesophageal reflux disease)   SBO (small bowel obstruction) (HCC)  1. 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 CT revealed SBO with likely transition point in left mid-abdomen. NPO for now and NGT decompression placed. Follow up general surgery recommendations.  2. Dehydration, secondary to poor PO intake in setting SBO. Resolved with IVF.  3. GERD, stable. Continue PPI. 4. DM type II. Metformin held. Continue SSI. A1c 6.5 01/2016 5. Essential HTN. Stable. Restart lisinopril once she is able to tolerate PO. Continue IV Hydralazine PRN for now. 6. Depression, anxiety. Continue current-dose Celexa.   DVT prophylaxis: Heparin Code Status: Full Family Communication: No family bedside.  Disposition Plan: Discharge   Consultants:   General surgery   Procedures:   None  Antimicrobials:   None   Subjective: Complains of lower abdominal pain. Denies any nausea or vomiting. Bowels are not moving but does have flatulence.   Objective: Filed Vitals:   04/27/16 1545 04/27/16 1551 04/27/16 1642 04/27/16 1934  BP:   147/82 140/66  Pulse: 71 73 71 77  Temp:   98.1  F (36.7 C) 97.8 F (36.6 C)  TempSrc:   Oral Oral  Resp: 18 13  18   Height:   5' (1.524 m)   Weight:   84.454 kg (186 lb 3 oz)   SpO2: 97% 97% 100% 98%    Intake/Output Summary (Last 24 hours) at 04/28/16 0831 Last data filed at 04/28/16 0523  Gross per 24 hour  Intake 871.67 ml  Output      0 ml  Net 871.67 ml   Filed Weights   04/27/16 1216 04/27/16 1642  Weight: 81.647 kg (180 lb) 84.454 kg (186 lb 3 oz)    Examination: General exam: Appears calm and comfortable  Respiratory system: Clear to auscultation. Respiratory effort normal. Cardiovascular system: S1 & S2 heard, RRR. No JVD, murmurs, rubs, gallops or clicks. No pedal edema. Gastrointestinal system: Abdomen has multiple scars consistent with prior surgeries. Abdomen feels full but soft and nontender. Bowel sounds are heard.  ntral nervous system: Alert and oriented. No focal neurological deficits. Extremities: Symmetric 5 x 5 power. Skin: No rashes, lesions or ulcers Psychiatry: Judgement and insight appear normal. Mood & affect appropriate.     Data Reviewed:   CBC:  Recent Labs Lab 04/27/16 1228 04/28/16 0448  WBC 17.9* 11.6*  NEUTROABS 12.4* 8.4*  HGB 15.9* 13.5  HCT 47.9* 41.9  MCV 96.6 99.1  PLT 253 186   Basic Metabolic Panel:  Recent Labs Lab 04/27/16 1228 04/28/16 0448  NA 136 137  K 4.6 4.1  CL 102 106  CO2 19* 23  GLUCOSE 149* 132*  BUN 17 14  CREATININE 1.04* 0.85  CALCIUM 10.1 8.9  Liver Function Tests:  Recent Labs Lab 04/27/16 1228  AST 24  ALT 16  ALKPHOS 53  BILITOT 0.6  PROT 8.0  ALBUMIN 4.9    Recent Labs Lab 04/27/16 1228  LIPASE 35   Coagulation Profile:  Recent Labs Lab 04/27/16 1756  INR 1.01   CBG:  Recent Labs Lab 04/27/16 2349 04/28/16 0524 04/28/16 0752  GLUCAP 148* 140* 128*   Urine analysis:    Component Value Date/Time   COLORURINE YELLOW 04/27/2016 1530   APPEARANCEUR CLEAR 04/27/2016 1530   APPEARANCEUR Clear 01/04/2016 1130     LABSPEC 1.010 04/27/2016 1530   PHURINE 5.0 04/27/2016 1530   GLUCOSEU NEGATIVE 04/27/2016 1530   HGBUR NEGATIVE 04/27/2016 1530   BILIRUBINUR NEGATIVE 04/27/2016 1530   BILIRUBINUR Negative 01/04/2016 1130   KETONESUR NEGATIVE 04/27/2016 1530   PROTEINUR NEGATIVE 04/27/2016 1530   PROTEINUR Negative 01/04/2016 1130   PROTEINUR neg 01/04/2016 1127   UROBILINOGEN 0.2 07/29/2007 1347   NITRITE NEGATIVE 04/27/2016 1530   NITRITE Negative 01/04/2016 1130   NITRITE neg 01/04/2016 1127   LEUKOCYTESUR NEGATIVE 04/27/2016 1530   LEUKOCYTESUR Negative 01/04/2016 1130   Sepsis Labs: (procalcitonin:4,lacticidven:4)  ) Recent Results (from the past 240 hour(s))  MRSA PCR Screening     Status: None   Collection Time: 04/27/16  6:16 PM  Result Value Ref Range Status   MRSA by PCR NEGATIVE NEGATIVE Final    Comment:        The GeneXpert MRSA Assay (FDA approved for NASAL specimens only), is one component of a comprehensive MRSA colonization surveillance program. It is not intended to diagnose MRSA infection nor to guide or monitor treatment for MRSA infections.          Radiology Studies: Dg Abd 1 View  04/27/2016  CLINICAL DATA:  Nasogastric tube placement. EXAM: ABDOMEN - 1 VIEW COMPARISON:  None. FINDINGS: The bowel gas pattern is normal. No radio-opaque calculi or other significant radiographic abnormality are seen. Nasogastric tube tip is seen in proximal stomach. IMPRESSION: Nasogastric tube tip seen in proximal stomach. No evidence of bowel obstruction or ileus. Electronically Signed   By: Lupita Raider, M.D.   On: 04/27/2016 16:07   Ct Abdomen Pelvis W Contrast  04/27/2016  CLINICAL DATA:  Severe abdominal pain. History of bowel obstruction. No vomiting or diarrhea. EXAM: CT ABDOMEN AND PELVIS WITH CONTRAST TECHNIQUE: Multidetector CT imaging of the abdomen and pelvis was performed using the standard protocol following bolus administration of intravenous  contrast. CONTRAST:  ISOVUE-300 IOPAMIDOL (ISOVUE-300) INJECTION 61% COMPARISON:  None. FINDINGS: Lower chest:  Clear lung bases.  Normal heart size. Hepatobiliary: Normal liver. Nonvisualized gallbladder likely secondary to prior cholecystectomy. Pancreas: Normal. Spleen: Normal. Adrenals/Urinary Tract: Normal adrenal glands. Normal kidneys. Extrarenal pelvis on the right. No urolithiasis. Normal bladder. Stomach/Bowel: Multiple dilated loops of small bowel with the most severely dilated loop in the pelvis measuring 4.1 cm in diameter with fecal material within the small bowel with relative transition point in the left mid abdomen which may be secondary to adhesions. No pneumatosis, pneumoperitoneum or portal venous gas. No abdominal or pelvic free fluid. Vascular/Lymphatic: Normal caliber abdominal aorta. No lymphadenopathy. Reproductive: No adnexal mass.  Prior hysterectomy. Other: No fluid collection or hematoma. Musculoskeletal: No acute osseous abnormality. 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 2 mm of retrolisthesis of L2 on L3 and L3 on L4. Bilateral facet arthropathy at L3-4, L4-5 and L5-S1. IMPRESSION:  1. Small bowel obstruction with the most severely dilated loop in the pelvis measuring 4.1 cm in diameter with fecal material within the small bowel with relative transition point in the left mid abdomen which may be secondary to adhesions. 2. Lumbar spine spondylosis. Electronically Signed   By: Elige Ko   On: 04/27/2016 14:50        Scheduled Meds: . citalopram  40 mg Oral Daily  . heparin  5,000 Units Subcutaneous Q8H  . insulin aspart  0-9 Units Subcutaneous Q4H  . pantoprazole (PROTONIX) IV  40 mg Intravenous Q24H  . simvastatin  40 mg Oral Daily   Continuous Infusions:    LOS: 1 day    Time spent: 20 minutes   Erick Blinks, MD  [facility] Pager [phone]  If 7PM-7AM, please contact  night-coverage [email or web address] Password [email or web address] 04/28/2016, 8:31 AM     By signing my name below, I, Zadie Cleverly, attest that this documentation has been prepared under the direction and in the presence of Erick Blinks, MD. Electronically signed: Zadie Cleverly, Scribe. 04/28/2016 11:20am  I, Dr. Erick Blinks, personally performed the services described in this documentaiton. All medical record entries made by the scribe were at my direction and in my presence. I have reviewed the chart and agree that the record reflects my personal performance and is accurate and complete  Erick Blinks, MD, 04/28/2016 11:38 AM

## 2016-04-28 NOTE — Progress Notes (Signed)
SURGICAL PROGRESS NOTE   Hospital Day(s): 1.   Post op day(s):  Marland Kitchen.   Interval History: Patient seen and examined, reports improved/decreased pain and resolution of nausea, denies fever/chills.    Review of Systems:  Constitutional: denies fever, chills  HEENT: denies cough or congestion  Respiratory: denies any shortness of breath  Cardiovascular: denies chest pain or palpitations  Gastrointestinal: denies N/V, diarrhea or constipation  Musculoskeletal: denies pain, decreased motor or sensation  Neurological: denies HA or vision/hearing changes   Vital signs in last 24 hours: [min-max] current  Temp:  [97.4 F (36.3 C)-98.1 F (36.7 C)] 97.8 F (36.6 C) (05/17 1934) Pulse Rate:  [70-91] 77 (05/17 1934) Resp:  [13-24] 18 (05/17 1934) BP: (131-149)/(62-87) 140/66 mmHg (05/17 1934) SpO2:  [97 %-100 %] 98 % (05/17 1934) Weight:  [81.647 kg (180 lb)-84.454 kg (186 lb 3 oz)] 84.454 kg (186 lb 3 oz) (05/17 1642)     Height: 5' (152.4 cm) Weight: 84.454 kg (186 lb 3 oz) BMI (Calculated): 36.4   Intake/Output this shift:  Total I/O In: 3 [I.V.:3] Out: 200 [Urine:200]   Intake/Output last 2 shifts:  @IOLAST2SHIFTS @   Physical Exam:  Constitutional: obese, alert, cooperative and no distress  HENT: normocephalic without obvious abnormality  Eyes: PERRL, EOM's grossly intact and symmetric  Neuro: CN II - XII grossly intact and symmetric without deficit  Respiratory: breathing non-labored at rest  Cardiovascular: regular rate and sinus rhythm  Gastrointestinal: soft, LLQ minimally tender, and non-distended, well-healed midline laparotomy scar, right of midline scar, and right-side colostomy scar Musculoskeletal: UE and LE FROM, no edema or wounds, motor and sensation grossly intact, NT    Labs:  CBC:  Lab Results  Component Value Date   WBC 11.6* 04/28/2016   RBC 4.23 04/28/2016   BMP:  Lab Results  Component Value Date   GLUCOSE 132* 04/28/2016   CO2 23 04/28/2016   BUN 14 04/28/2016   CREATININE 0.85 04/28/2016   CALCIUM 8.9 04/28/2016     Imaging studies: No new pertinent imaging studies   Assessment/Plan:  75 y.o. 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   - continue NPO   - minimize narcotics   - strict monitoring and documentation of In's/Out's (including NG tube drainage)  - medical management of comorbidities as per medical team  - ambulation encouraged, will follow  All of the above findings were discussed with the patient, her daughter, and medical team. All of patient's and her daughter's questions were answered to their expressed satisfaction.  -- Scherrie GerlachJason E. Earlene Plateravis, MD : [facility] Office: [phone]
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
## 2016-04-29 DIAGNOSIS — F411 Generalized anxiety disorder: Secondary | ICD-10-CM

## 2016-04-29 LAB — CBC
HEMATOCRIT: 38.5 % (ref 36.0–46.0)
HEMOGLOBIN: 12.5 g/dL (ref 12.0–15.0)
MCH: 31.6 pg (ref 26.0–34.0)
MCHC: 32.5 g/dL (ref 30.0–36.0)
MCV: 97.5 fL (ref 78.0–100.0)
Platelets: 146 10*3/uL — ABNORMAL LOW (ref 150–400)
RBC: 3.95 MIL/uL (ref 3.87–5.11)
RDW: 14 % (ref 11.5–15.5)
WBC: 8 10*3/uL (ref 4.0–10.5)

## 2016-04-29 LAB — GLUCOSE, CAPILLARY
GLUCOSE-CAPILLARY: 116 mg/dL — AB (ref 65–99)
GLUCOSE-CAPILLARY: 116 mg/dL — AB (ref 65–99)
Glucose-Capillary: 122 mg/dL — ABNORMAL HIGH (ref 65–99)
Glucose-Capillary: 104 mg/dL — ABNORMAL HIGH (ref 65–99)
Glucose-Capillary: 87 mg/dL (ref 65–99)

## 2016-04-29 LAB — BASIC METABOLIC PANEL
ANION GAP: 8 (ref 5–15)
BUN: 10 mg/dL (ref 6–20)
CALCIUM: 8.9 mg/dL (ref 8.9–10.3)
CO2: 27 mmol/L (ref 22–32)
Chloride: 101 mmol/L (ref 101–111)
Creatinine, Ser: 0.78 mg/dL (ref 0.44–1.00)
GFR calc Af Amer: >60 mL/min (ref >60)
GFR calc non Af Amer: >60 mL/min (ref >60)
GLUCOSE: 110 mg/dL — AB (ref 65–99)
Potassium: 4.3 mmol/L (ref 3.5–5.1)
Sodium: 136 mmol/L (ref 135–145)

## 2016-04-29 MED ORDER — DEXTROSE-NACL 5-0.45 % IV SOLN
INTRAVENOUS | Status: DC
  Administered 2016-04-29: 22:00:00 via INTRAVENOUS

## 2016-04-29 NOTE — Care Management Important Message (Signed)
Important Message  Patient Details  Name: [name] [name] MRN: [id number] Date of Birth: 1941-03-20   Medicare Important Message Given:  Yes    Herminia Warren, Chrystine OilerSharley Diane, RN 04/29/2016, 9:50 AM

## 2016-04-29 NOTE — Progress Notes (Signed)
SURGICAL PROGRESS NOTE   Hospital Day(s): 2.   Post op day(s):  Marland Kitchen.   Interval History: Patient seen and examined, no acute events or new complaints overnight. Patient reports +flatus and +BM, denies nausea, and reports further improved pain (though not yet completely resolved).   Review of Systems:  Constitutional: denies fever, chills  HEENT: denies cough or congestion  Respiratory: denies any shortness of breath  Cardiovascular: denies chest pain or palpitations  Gastrointestinal: denies N/V, diarrhea or constipation  Musculoskeletal: denies pain, decreased motor or sensation  Neurological: denies HA or vision/hearing changes   Vital signs in last 24 hours: [min-max] current  Temp:  [97.9 F (36.6 C)-98.4 F (36.9 C)] 98.1 F (36.7 C) (05/19 0500) Pulse Rate:  [63-74] 63 (05/19 0500) Resp:  [18] 18 (05/19 0500) BP: (128-135)/(58-68) 128/61 mmHg (05/19 0500) SpO2:  [94 %-100 %] 100 % (05/19 0500)     Height: 5' (152.4 cm) Weight: 84.454 kg (186 lb 3 oz) BMI (Calculated): 36.4   Intake/Output this shift:      Intake/Output last 2 shifts:  @IOLAST2SHIFTS @   Physical Exam:  Constitutional: alert, cooperative and no distress  HENT: normocephalic without obvious abnormality  Eyes: PERRL, EOM's grossly intact and symmetric  Neuro: CN II - XII grossly intact and symmetric without deficit  Respiratory: breathing non-labored at rest  Cardiovascular: regular rate and sinus rhythm  Gastrointestinal: soft and non-distended with mild-moderate LLQ tenderness to palpation, well-healed scars  Musculoskeletal: UE and LE FROM, no edema or open wounds, motor and sensation grossly intact, NT    Labs:  CBC:  Lab Results  Component Value Date   WBC 8.0 04/29/2016   RBC 3.95 04/29/2016   BMP:  Lab Results  Component Value Date   GLUCOSE 110* 04/29/2016   CO2 27 04/29/2016   BUN 10 04/29/2016   CREATININE 0.78 04/29/2016   CALCIUM 8.9 04/29/2016     Imaging studies: No new  pertinent imaging studies   Assessment/Plan:  75 y.o. 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  - continue NPO  - minimize narcotics, continue NGT for now (likely remove tomorrow) - strict monitoring and documentation of In's/Out's (including NG tube drainage) - medical management of comorbidities as per medical team - ambulation encouraged, will follow  All of the above findings were discussed with the patient and medical team, and all of patient's questions were answered to their expressed satisfaction.  -- Scherrie GerlachJason E. Earlene Plateravis, MD [place]: [facility] Office: [phone]

## 2016-04-29 NOTE — Progress Notes (Signed)
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 Evaluation in the ED consisted of of CT A/P revealed SBO with transition in the left mid-abdomen. Leukocytosis and elevated lactic acid also noted. NGT placed for decompression and empiric abx started. EDP consulted general surgery and she has been referred for admission.    Assessment & Plan: Principal Problem:   Small bowel obstruction (HCC) Active Problems:   Dehydration   Essential hypertension   Non-insulin dependent type 2 diabetes mellitus (HCC)   Depression   Anxiety state   GERD (gastroesophageal reflux disease)   SBO (small bowel obstruction) (HCC)  1. 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 CT revealed SBO with likely transition point in left mid-abdomen. Improving. Reports bowel movement this morning and passing flatus. Will consider advancing diet to clear liquids and removing NG tube. Appreciate general surgery input.  2. Dehydration, secondary to poor PO intake in setting SBO. Resolved with IVF.  3. GERD, stable. Continue PPI. 4. DM type II. Metformin held. Continue SSI. A1c 6.5 01/2016 5. Essential HTN. Stable. Restart lisinopril once she is able to tolerate PO. Continue IV Hydralazine PRN for now. 6. Depression, anxiety. Continue current-dose Celexa.   DVT prophylaxis: Heparin Code Status: Full Family Communication: No family bedside.  Disposition Plan: Discharge   Consultants:   General surgery   Procedures:   None  Antimicrobials:   None   Subjective: Feels a lot better. Bowel movement this morning. Denies nausea. Mild abdominal pain.   Objective: Filed Vitals:   04/27/16 1934 04/28/16 1346 04/28/16 2100 04/29/16 0500    BP: 140/66 133/58 135/68 128/61  Pulse: 77 74 67 63  Temp: 97.8 F (36.6 C) 98.4 F (36.9 C) 97.9 F (36.6 C) 98.1 F (36.7 C)  TempSrc: Oral Oral Oral Oral  Resp: Height:      Weight:      SpO2: 98% 94% 99% 100%    Intake/Output Summary (Last 24 hours) at 04/29/16 0800 Last data filed at 04/28/16 1842  Gross per 24 hour  Intake 444.33 ml  Output      0 ml  Net 444.33 ml   Filed Weights   04/27/16 1216 04/27/16 1642  Weight: 81.647 kg (180 lb) 84.454 kg (186 lb 3 oz)    Examination: General exam: Appears calm and comfortable  Respiratory system: Clear to auscultation. Respiratory effort normal. Cardiovascular system: S1 & S2 heard, RRR. No JVD, murmurs, rubs, gallops or clicks. No pedal edema. Gastrointestinal system: Abdomen has multiple scars consistent with prior surgeries. Mild  tenderness in LLQ. Positive bowel sounds.  Central nervous system: Alert and oriented. No focal neurological deficits. Extremities: Symmetric 5 x 5 power. Skin: No rashes, lesions or ulcers Psychiatry: Judgement and insight appear normal. Mood & affect appropriate.     Data Reviewed:   CBC:  Recent Labs Lab 04/27/16 1228 04/28/16 0448 04/29/16 0539  WBC 17.9* 11.6* 8.0  NEUTROABS 12.4* 8.4*  --   HGB 15.9* 13.5 12.5  HCT 47.9* 41.9 38.5  MCV 96.6 99.1 97.5  PLT 253 186 146*   Basic Metabolic Panel:  Recent Labs Lab 05/CHIZUKO TRINE5/18/17 0448 04/29/16 0539  NA 136 137 136  K 4.6 4.1 4.3  CL 102  106 101  CO2 19* 23 27  GLUCOSE 149* 132* 110*  BUN 17 14 10   CREATININE 1.04* 0.85 0.78  CALCIUM 10.1 8.9 8.9  Liver Function Tests:  Recent Labs Lab 04/27/16 1228  AST 24  ALT 16  ALKPHOS 53  BILITOT 0.6  PROT 8.0  ALBUMIN 4.9    Recent Labs Lab 04/27/16 1228  LIPASE 35   Coagulation Profile:  Recent Labs Lab 04/27/16 1756  INR 1.01   CBG:  Recent Labs Lab 04/28/16 1141 04/28/16 1615 04/28/16 2013 04/28/16 2349 04/29/16 0411   GLUCAP 124* 112* 116* 120* 116*   Urine analysis:    Component Value Date/Time   COLORURINE YELLOW 04/27/2016 1530   APPEARANCEUR CLEAR 04/27/2016 1530   APPEARANCEUR Clear 01/04/2016 1130   LABSPEC 1.010 04/27/2016 1530   PHURINE 5.0 04/27/2016 1530   GLUCOSEU NEGATIVE 04/27/2016 1530   HGBUR NEGATIVE 04/27/2016 1530   BILIRUBINUR NEGATIVE 04/27/2016 1530   BILIRUBINUR Negative 01/04/2016 1130   KETONESUR NEGATIVE 04/27/2016 1530   PROTEINUR NEGATIVE 04/27/2016 1530   PROTEINUR Negative 01/04/2016 1130   PROTEINUR neg 01/04/2016 1127   UROBILINOGEN 0.2 07/29/2007 1347   NITRITE NEGATIVE 04/27/2016 1530   NITRITE Negative 01/04/2016 1130   NITRITE neg 01/04/2016 1127   LEUKOCYTESUR NEGATIVE 04/27/2016 1530   LEUKOCYTESUR Negative 01/04/2016 1130   Sepsis Labs: @LABRCNTIP (procalcitonin:4,lacticidven:4)  ) Recent Results (from the past 240 hour(s))  MRSA PCR Screening     Status: None   Collection Time: 04/27/16  6:16 PM  Result Value Ref Range Status   MRSA by PCR NEGATIVE NEGATIVE Final    Comment:        The GeneXpert MRSA Assay (FDA approved for NASAL specimens only), is one component of a comprehensive MRSA colonization surveillance program. It is not intended to diagnose MRSA infection nor to guide or monitor treatment for MRSA infections.          Radiology Studies: Dg Abd 1 View  04/27/2016  CLINICAL DATA:  Nasogastric tube placement. EXAM: ABDOMEN - 1 VIEW COMPARISON:  None. FINDINGS: The bowel gas pattern is normal. No radio-opaque calculi or other significant radiographic abnormality are seen. Nasogastric tube tip is seen in proximal stomach. IMPRESSION: Nasogastric tube tip seen in proximal stomach. No evidence of bowel obstruction or ileus. Electronically Signed   By: Lupita Raider, M.D.   On: 04/27/2016 16:07   Ct Abdomen Pelvis W Contrast  04/27/2016  CLINICAL DATA:  Severe abdominal pain. History of bowel obstruction. No vomiting or diarrhea.  EXAM: CT ABDOMEN AND PELVIS WITH CONTRAST TECHNIQUE: Multidetector CT imaging of the abdomen and pelvis was performed using the standard protocol following bolus administration of intravenous contrast. CONTRAST:  ISOVUE-300 IOPAMIDOL (ISOVUE-300) INJECTION 61% COMPARISON:  None. FINDINGS: Lower chest:  Clear lung bases.  Normal heart size. Hepatobiliary: Normal liver. Nonvisualized gallbladder likely secondary to prior cholecystectomy. Pancreas: Normal. Spleen: Normal. Adrenals/Urinary Tract: Normal adrenal glands. Normal kidneys. Extrarenal pelvis on the right. No urolithiasis. Normal bladder. Stomach/Bowel: Multiple dilated loops of small bowel with the most severely dilated loop in the pelvis measuring 4.1 cm in diameter with fecal material within the small bowel with relative transition point in the left mid abdomen which may be secondary to adhesions. No pneumatosis, pneumoperitoneum or portal venous gas. No abdominal or pelvic free fluid. Vascular/Lymphatic: Normal caliber abdominal aorta. No lymphadenopathy. Reproductive: No adnexal mass.  Prior hysterectomy. Other: No fluid collection or hematoma. Musculoskeletal: No acute osseous abnormality. Degenerative disc disease with disc  height loss at L2-3, L3-4, L4-5 and L5-S1. 2 mm of retrolisthesis of L2 on L3 and L3 on L4. Bilateral facet arthropathy at L3-4, L4-5 and L5-S1. IMPRESSION: 1. Small bowel obstruction with the most severely dilated loop in the pelvis measuring 4.1 cm in diameter with fecal material within the small bowel with relative transition point in the left mid abdomen which may be secondary to adhesions. 2. Lumbar spine spondylosis. Electronically Signed   By: Elige KoHetal  Patel   On: 04/27/2016 14:50        Scheduled Meds: . antiseptic oral rinse  7 mL Mouth Rinse q12n4p  . chlorhexidine  15 mL Mouth Rinse BID  . citalopram  40 mg Oral Daily  . heparin  5,000 Units Subcutaneous Q8H  . insulin aspart  0-9 Units Subcutaneous Q4H  .  pantoprazole (PROTONIX) IV  40 mg Intravenous Q24H  . simvastatin  40 mg Oral Daily  . sodium chloride flush  3 mL Intravenous Q12H   Continuous Infusions:    LOS: 2 days    Time spent: 20 minutes   Erick BlinksMemon, Jayma Volpi, MD  [facility] Pager [phone]  If 7PM-7AM, please contact night-coverage [email or web address] Password [email or web address] 04/29/2016, 8:00 AM     By signing my name below, I, Zadie CleverlyJessica Augustus, attest that this documentation has been prepared under the direction and in the presence of Erick BlinksJehanzeb Carlee Vonderhaar, MD. Electronically signed: Zadie CleverlyJessica Augustus, Scribe. 04/29/2016 11:00am  I, Dr. Erick BlinksJehanzeb Tomicka Lover, personally performed the services described in this documentaiton. All medical record entries made by the scribe were at my direction and in my presence. I have reviewed the chart and agree that the record reflects my personal performance and is accurate and complete  Erick BlinksJehanzeb Kirston Luty, MD, 04/29/2016 11:10 AM

## 2016-04-30 LAB — GLUCOSE, CAPILLARY
GLUCOSE-CAPILLARY: 100 mg/dL — AB (ref 65–99)
GLUCOSE-CAPILLARY: 108 mg/dL — AB (ref 65–99)
GLUCOSE-CAPILLARY: 127 mg/dL — AB (ref 65–99)
GLUCOSE-CAPILLARY: 145 mg/dL — AB (ref 65–99)
Glucose-Capillary: 111 mg/dL — ABNORMAL HIGH (ref 65–99)
Glucose-Capillary: 67 mg/dL (ref 65–99)
Glucose-Capillary: 109 mg/dL — ABNORMAL HIGH (ref 65–99)

## 2016-04-30 NOTE — Progress Notes (Signed)
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 Evaluation in the ED consisted of of CT A/P revealed SBO with transition in the left mid-abdomen. Leukocytosis and elevated lactic acid also noted. NGT placed for decompression and empiric abx started. EDP consulted general surgery and she has been referred for admission.    Assessment & Plan: Principal Problem:   Small bowel obstruction (HCC) Active Problems:   Dehydration   Essential hypertension   Non-insulin dependent type 2 diabetes mellitus (HCC)   Depression   Anxiety state   GERD (gastroesophageal reflux disease)   SBO (small bowel obstruction) (HCC)  1. 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 CT revealed SBO with likely transition point in left mid-abdomen. Improving. Patient reports last bowel movement was yesterday morning, but she still is passing mild flatus and has improvement in abdominal pain. Nausea and vomiting resolved. Staff reports pt removed her NGT this morning. Yet, reported she had low output overnight. Will advance to clear liquids today, advance diet as tolerated. Continue ambulation to encourage bowel movement. Appreciate general surgery input.  2. Dehydration, secondary to poor PO intake in setting SBO. Resolved with IVF.  3. GERD, stable. Continue PPI. 4. DM type II. Metformin held. Continue SSI. A1c 6.5 01/2016 5. Essential HTN. Stable. Restart lisinopril once she is able to tolerate PO. Continue IV Hydralazine PRN for now. 6. Depression, anxiety. Continue current-dose Celexa.   DVT prophylaxis: Heparin Code Status: Full Family Communication: Son Orvilla Fus, and granddaughter bedside Disposition Plan: Anticipate discharge    Consultants:   General surgery   Procedures:   None  Antimicrobials:   None   Subjective:Feels better. Has flatus but no bowel movements this morning. Mild abdominal pain. No nausea.  Objective: Filed Vitals:   04/28/16 2100 04/29/16 0500 04/29/16 2125 04/30/16 0455  BP: 135/68 128/61 152/66 134/57  Pulse: 67 63 68 63  Temp: 97.9 F (36.6 C) 98.1 F (36.7 C) 97.8 F (36.6 C) 97.9 F (36.6 C)  TempSrc: Oral Oral Oral Oral  Resp: Height:      Weight:      SpO2: 99% 100% 98% 98%    Intake/Output Summary (Last 24 hours) at 04/30/16 0906 Last data filed at 04/30/16 0152  Gross per 24 hour  Intake  175.5 ml  Output    600 ml  Net -424.5 ml   Filed Weights   04/27/16 1216 04/27/16 1642  Weight: 81.647 kg (180 lb) 84.454 kg (186 lb 3 oz)    Examination: General exam: Appears calm and comfortable  Respiratory system: Clear to auscultation. Respiratory effort normal. Cardiovascular system: S1 & S2 heard, RRR. No JVD, murmurs, rubs, gallops or clicks. No pedal edema. Gastrointestinal system: Abdomen has multiple scars consistent with prior surgeries. Positive bowel sounds.  Central nervous system: Alert and oriented. No focal neurological deficits. Extremities: Symmetric 5 x 5 power. Skin: No rashes, lesions or ulcers Psychiatry: Judgement and insight appear normal. Mood & affect appropriate.     Data Reviewed:   CBC:  Recent Labs Lab 04/27/16 1228 04/28/16 0448 04/29/16 0539  WBC 17.9* 11.6* 8.0  NEUTROABS 12.4* 8.4*  --   HGB 15.9* 13.5 12.5  HCT 47.9* 41.9 38.5  MCV 96.6 99.1 97.5  PLT 253 186 146*   Basic Metabolic Panel:  Recent Labs Lab 04/27/16 1228 04/28/16 0448 04/29/16 0539  NA 136 137 136  K 4.6 4.1 4.3  CL 102 106 101  CO2 19* 23 27  GLUCOSE 149* 132* 110*  BUN 17 14 10   CREATININE 1.04* 0.85 0.78  CALCIUM 10.1 8.9 8.9  Liver Function Tests:  Recent Labs Lab 04/27/16 1228  AST 24  ALT 16  ALKPHOS 53   BILITOT 0.6  PROT 8.0  ALBUMIN 4.9    Recent Labs Lab 04/27/16 1228  LIPASE 35   Coagulation Profile:  Recent Labs Lab 04/27/16 1756  INR 1.01   CBG:  Recent Labs Lab 04/29/16 1730 04/29/16 2019 04/30/16 0025 04/30/16 0454 04/30/16 0802  GLUCAP 104* 87 108* 100* 111*   Urine analysis:    Component Value Date/Time   COLORURINE YELLOW 04/27/2016 1530   APPEARANCEUR CLEAR 04/27/2016 1530   APPEARANCEUR Clear 01/04/2016 1130   LABSPEC 1.010 04/27/2016 1530   PHURINE 5.0 04/27/2016 1530   GLUCOSEU NEGATIVE 04/27/2016 1530   HGBUR NEGATIVE 04/27/2016 1530   BILIRUBINUR NEGATIVE 04/27/2016 1530   BILIRUBINUR Negative 01/04/2016 1130   KETONESUR NEGATIVE 04/27/2016 1530   PROTEINUR NEGATIVE 04/27/2016 1530   PROTEINUR Negative 01/04/2016 1130   PROTEINUR neg 01/04/2016 1127   UROBILINOGEN 0.2 07/29/2007 1347   NITRITE NEGATIVE 04/27/2016 1530   NITRITE Negative 01/04/2016 1130   NITRITE neg 01/04/2016 1127   LEUKOCYTESUR NEGATIVE 04/27/2016 1530   LEUKOCYTESUR Negative 01/04/2016 1130   Sepsis Labs: @LABRCNTIP (procalcitonin:4,lacticidven:4)  ) Recent Results (from the past 240 hour(s))  MRSA PCR Screening     Status: None   Collection Time: 04/27/16  6:16 PM  Result Value Ref Range Status   MRSA by PCR NEGATIVE NEGATIVE Final    Comment:        The GeneXpert MRSA Assay (FDA approved for NASAL specimens only), is one component of a comprehensive MRSA colonization surveillance program. It is not intended to diagnose MRSA infection nor to guide or monitor treatment for MRSA infections.          Radiology Studies: No results found.      Scheduled Meds: . antiseptic oral rinse  7 mL Mouth Rinse q12n4p  . chlorhexidine  15 mL Mouth Rinse BID  . citalopram  40 mg Oral Daily  . heparin  5,000 Units Subcutaneous Q8H  . insulin aspart  0-9 Units Subcutaneous Q4H  . pantoprazole (PROTONIX) IV  40 mg Intravenous Q24H  . simvastatin  40 mg Oral  Daily  . sodium chloride flush  3 mL Intravenous Q12H   Continuous Infusions: . dextrose 5 % and 0.45% NaCl 50 mL/hr at 04/30/16 0100     LOS: 3 days    Time spent: 20 minutes   Erick BlinksMemon, Joleigh Mineau, MD  [facility] Pager [phone]  If 7PM-7AM, please contact night-coverage [email or web address] Password [email or web address] 04/30/2016, 9:06 AM     By signing my name below, I, Zadie CleverlyJessica Augustus, attest that this documentation has been prepared under the direction and in the presence of Erick BlinksJehanzeb Nikko Goldwire, MD. Electronically signed: Zadie CleverlyJessica Augustus, Scribe. 04/30/2016 11:20pm  I, Dr. Erick BlinksJehanzeb Mahreen Schewe, personally performed the services described in this documentaiton. All medical record entries made by the scribe were at my direction and in my presence. I have reviewed the chart and agree that the record reflects my personal performance and is accurate and complete  Erick BlinksJehanzeb Ollis Daudelin, MD, 04/30/2016 11:37 AM

## 2016-04-30 NOTE — Progress Notes (Signed)
SURGICAL PROGRESS NOTE   Hospital Day(s): 3.   Post op day(s):  Marland Kitchen.   Interval History: Patient seen and examined, NGT dislodged this morning. Patient reports passing flatus and two BM's including one large BM, denies pain, nausea, CP, SOB, or fever/chills.  Review of Systems:  Constitutional: denies fever, chills  HEENT: denies cough or congestion  Respiratory: denies any shortness of breath  Cardiovascular: denies chest pain or palpitations  Gastrointestinal: denies N/V, diarrhea or constipation  Musculoskeletal: denies pain, decreased motor or sensation  Neurological: denies HA or vision/hearing changes   Vital signs in last 24 hours: [min-max] current  Temp:  [97.8 F (36.6 C)-97.9 F (36.6 C)] 97.9 F (36.6 C) (05/20 0455) Pulse Rate:  [63-68] 63 (05/20 0455) Resp:  [18] 18 (05/20 0455) BP: (134-152)/(57-66) 134/57 mmHg (05/20 0455) SpO2:  [98 %] 98 % (05/20 0455)     Height: 5' (152.4 cm) Weight: 84.454 kg (186 lb 3 oz) BMI (Calculated): 36.4   Intake/Output this shift:      Intake/Output last 2 shifts:  @IOLAST2SHIFTS @   Physical Exam:  Constitutional: alert, cooperative and no distress  HENT: normocephalic without obvious abnormality  Eyes: PERRL, EOM's grossly intact and symmetric  Neuro: CN II - XII grossly intact and symmetric without deficit  Respiratory: breathing non-labored at rest  Cardiovascular: regular rate and sinus rhythm  Gastrointestinal: obese with abdomen soft, non-tender, and non-distended, well-healed incisional scars Musculoskeletal: UE and LE FROM, motor and sensation grossly intact, NT    Labs:  CBC:  Lab Results  Component Value Date   WBC 8.0 04/29/2016   RBC 3.95 04/29/2016   BMP:  Lab Results  Component Value Date   GLUCOSE 110* 04/29/2016   CO2 27 04/29/2016   BUN 10 04/29/2016   CREATININE 0.78 04/29/2016   CALCIUM 8.9 04/29/2016     Imaging studies: No new pertinent imaging studies   Assessment/Plan:  75 y.o.  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  - ambulation encouraged - agree with advance to clear liquids as tolerated, avoid narcotics  - if continues to improve, anticipate advancement of diet and discharge planning - medical management of comorbidities as per medical team  All of the above findings were discussed with the patient and medical team, and all of patient's questions were answered to their expressed satisfaction.  -- Scherrie GerlachJason E. Earlene Plateravis, MD [place]: [facility] Office: [phone]

## 2016-04-30 NOTE — Progress Notes (Signed)
Hypoglycemic Event  CBG: 67  Treatment: 15 GM carbohydrate snack  Symptoms: None  Follow-up CBG: Time:  1728 CBG Result:  45  Possible Reasons for Event: Inadequate meal intake  Comments/MD notified:  Patient now on clear liquids.  Snack provided with increased blood sugar.    Dorene GrebeHAWKINS, Alexx Mcburney

## 2016-05-01 DIAGNOSIS — F329 Major depressive disorder, single episode, unspecified: Secondary | ICD-10-CM

## 2016-05-01 LAB — GLUCOSE, CAPILLARY
GLUCOSE-CAPILLARY: 162 mg/dL — AB (ref 65–99)
GLUCOSE-CAPILLARY: 142 mg/dL — AB (ref 65–99)
Glucose-Capillary: 120 mg/dL — ABNORMAL HIGH (ref 65–99)
Glucose-Capillary: 119 mg/dL — ABNORMAL HIGH (ref 65–99)
Glucose-Capillary: 176 mg/dL — ABNORMAL HIGH (ref 65–99)

## 2016-05-01 MED ORDER — POLYETHYLENE GLYCOL 3350 17 G PO PACK: 17.0000 g | PACK | Freq: Every day | ORAL | Status: DC | PRN

## 2016-05-01 NOTE — Progress Notes (Signed)
1039 Patient tolerating clear liquid diet well at this time & is requesting to eat solid food. Patient had BM this AM. MD notified requesting diet advancement.

## 2016-05-01 NOTE — Discharge Summary (Signed)
Physician Discharge Summary  Scharlene Corn AVW:[id number] DOB: March 05, 1941 DOA: 04/27/2016  PCP: Wilson Singer, MD  Admit date: 04/27/2016 Discharge date: 05/01/2016  Time spent: 35 minutes  Recommendations for Outpatient Follow-up:  1. Follow up with PCP in 1-2 weeks.   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 Evaluation in the ED consisted of of CT A/P revealed SBO with transition in the left mid-abdomen. Leukocytosis and elevated lactic acid also noted. NGT placed for decompression and empiric abx started. EDP consulted general surgery and she has been referred for admission  Hospital Course:  Patient presented with complaints of severe abdominal pain and nausea found to be due to SBO with transition in the left mid-abdomen on CT A/P. Patient was placed NPO and NGT inserted on admission. General surgery was consulted and did not believe surgical intervention was needed. With bowel rest and NG tube decompression, her symptoms gradually improved. NG tube was eventually discontinued and diet was advanced. On discharge patient noted to have multiple bowel movements, passing flatus, abdominal pain and nausea resolved and she was able to tolerate a solid diet. Follow up with general surgery as needed.  1. Dehydration, secondary to poor PO intake  in setting SBO. Resolved with IVF.  2. GERD, stable. Continue PPI. 3. DM type II.  A1c 6.5 01/2016 Resume outpatient regimen on discharge. 4. Essential HTN. Stable. Resume outpatient regimen on discharge. 5. Depression, anxiety. Continue current-dose Celexa.   Procedures:  None  Consultations:  General surgery   Discharge Exam: Filed Vitals:   05/01/16 0540 05/01/16 1528  BP: 145/72 131/62  Pulse: 64 62  Temp: 98.4 F (36.9 C) 98.5 F (36.9 C)  Resp: 20 20    General: NAD. Appears calm and looks comfortable lying in bed.  Cardiovascular: RRR Respiratory: clear bilaterally, No wheezing, rales or rhonchi Abdomen: soft, non tender, no distention , bowel sounds normal Musculoskeletal: No edema b/l  Discharge Instructions Discharge Instructions    Diet - low sodium heart healthy    Complete by:  As directed      Increase activity slowly    Complete by:  As directed           Current Discharge Medication List    START taking these medications   Details  polyethylene glycol (MIRALAX) packet Take 17 g by mouth daily as needed for mild constipation. Qty: 30 each, Refills: 0      CONTINUE these medications which have NOT CHANGED   Details  aspirin EC 81 MG tablet Take 81 mg by mouth daily.    citalopram (CELEXA) 40 MG tablet Take 40 mg by mouth daily.    lisinopril (PRINIVIL,ZESTRIL) 2.5 MG tablet Take 2.5 mg by mouth daily.    metFORMIN (GLUCOPHAGE) 500 MG tablet Take 500 mg by mouth as needed.    omeprazole (PRILOSEC) 40  MG capsule Take 40 mg by mouth daily.    simvastatin (ZOCOR) 40 MG tablet Take 40 mg by mouth daily.    traMADol (ULTRAM) 50 MG tablet Take 50 mg by mouth every 6 (six) hours as needed for moderate pain.    pregabalin (LYRICA) 50 MG capsule Take 1 capsule (50 mg total) by mouth daily. Qty: 30 capsule, Refills: 1   Associated Diagnoses: Left-sided low back pain with left-sided sciatica       Allergies  Allergen Reactions  . Penicillins  Nausea Only    Has patient had a PCN reaction causing immediate rash, facial/tongue/throat swelling, SOB or lightheadedness with hypotension: Yes Has patient had a PCN reaction causing severe rash involving mucus membranes or skin necrosis: No Has patient had a PCN reaction that required hospitalization No Has patient had a PCN reaction occurring within the last 10 years: No If all of the above answers are "NO", then may proceed with Cephalosporin use.   . Sulfa Antibiotics Nausea And Vomiting      The results of significant diagnostics from this hospitalization (including imaging, microbiology, ancillary and laboratory) are listed below for reference.    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 EXAM: ABDOMEN - 1 VIEW COMPARISON:  None. FINDINGS: The bowel gas pattern is normal. No radio-opaque calculi or other significant radiographic abnormality are seen. Nasogastric tube tip is seen in proximal stomach. IMPRESSION: Nasogastric tube tip seen in proximal stomach. No evidence of bowel obstruction or ileus. Electronically Signed   By: Lupita RaiderJames  Green Jr, M.D.   On: 04/27/2016 16:07   Ct Abdomen Pelvis W Contrast  04/27/2016  CLINICAL DATA:  Severe abdominal pain. History of bowel obstruction. No vomiting or diarrhea. EXAM: CT ABDOMEN AND PELVIS WITH CONTRAST TECHNIQUE: Multidetector CT imaging of the abdomen and pelvis was performed using the standard protocol following bolus administration of intravenous contrast. CONTRAST:  100mL ISOVUE-300  IOPAMIDOL (ISOVUE-300) INJECTION 61% COMPARISON:  None. FINDINGS: Lower chest:  Clear lung bases.  Normal heart size. Hepatobiliary: Normal liver. Nonvisualized gallbladder likely secondary to prior cholecystectomy. Pancreas: Normal. Spleen: Normal. Adrenals/Urinary Tract: Normal adrenal glands. Normal kidneys. Extrarenal pelvis on the right. No urolithiasis. Normal bladder. Stomach/Bowel: Multiple dilated loops of small bowel with the most severely dilated loop in the pelvis measuring 4.1 cm in diameter with fecal material within the small bowel with relative transition point in the left mid abdomen which may be secondary to adhesions. No pneumatosis, pneumoperitoneum or portal venous gas. No abdominal or pelvic free fluid. Vascular/Lymphatic: Normal caliber abdominal aorta. No lymphadenopathy. Reproductive: No adnexal mass.  Prior hysterectomy. Other: No fluid collection or hematoma. Musculoskeletal: No acute osseous abnormality. 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 2 mm of retrolisthesis of L2 on L3 and L3 on L4. Bilateral facet arthropathy at L3-4, L4-5 and L5-S1. IMPRESSION: 1. Small bowel obstruction with the most severely dilated loop in the pelvis measuring 4.1 cm in diameter with fecal material within the small bowel with relative transition point in  the left mid abdomen which may be secondary to adhesions. 2. Lumbar spine spondylosis. Electronically Signed   By: Elige Ko   On: 04/27/2016 14:50   Mr Hip Left Wo Contrast  04/04/2016  CLINICAL DATA:  Left hip pain and numbness for 1 year EXAM: MR OF THE LEFT HIP WITHOUT CONTRAST TECHNIQUE: Multiplanar, multisequence MR imaging was performed. No intravenous contrast was administered. COMPARISON:  None. FINDINGS: Bones: No acute fracture, dislocation or avascular necrosis. Normal sacrum and sacroiliac joints. Degenerative disc disease at L4-5 and L5-S1 with bilateral facet arthropathy. Articular cartilage and  labrum Articular cartilage:  No chondral defect. Labrum:  No chondral defect. Joint or bursal effusion Joint effusion:  No joint effusion. Bursae:  No bursa formation. Muscles and tendons Flexors: Normal. Extensors: Normal. Abductors: Normal. Adductors: Normal. Gluteals: Mild tendinosis of the right gluteus minimus insertion. Moderate tendinosis of the gluteus medius insertion bilaterally. Hamstrings: Small partial tear at the hamstring origin bilaterally. Other findings Miscellaneous: No fluid collection or hematoma. No pelvic free fluid. No inguinal hernia. No inguinal lymphadenopathy. IMPRESSION: 1. No hip fracture, dislocation or avascular necrosis. 2. Mild tendinosis of the right gluteus minimus insertion. 3. Moderate tendinosis of the gluteus medius insertion bilaterally. 4. Partial visualization of lower lumbar spine spondylosis. Electronically Signed   By: Elige Ko   On: 04/04/2016 15:46    Microbiology: Recent Results (from the past 240 hour(s))  MRSA PCR Screening     Status: None   Collection Time: 04/27/16  6:16 PM  Result Value Ref Range Status   MRSA by PCR NEGATIVE NEGATIVE Final    Comment:        The GeneXpert MRSA Assay (FDA approved for NASAL specimens only), is one component of a comprehensive MRSA colonization surveillance program. It is not intended to diagnose MRSA infection nor to guide or monitor treatment for MRSA infections.      Labs: Basic Metabolic Panel:  Recent Labs Lab 04/27/16 1228 04/28/16 0448 04/29/16 0539  NA 136 137 136  K 4.6 4.1 4.3  CL 102 106 101  CO2 19* 23 27  GLUCOSE 149* 132* 110*  BUN CREATININE 1.04* 0.85 0.78  CALCIUM 10.1 8.9 8.9   Liver Function Tests:  Recent Labs Lab 04/27/16 1228  AST 24  ALT 16  ALKPHOS 53  BILITOT 0.6  PROT 8.0  ALBUMIN 4.9    Recent Labs Lab 04/27/16 1228  LIPASE 35   CBC:  Recent Labs Lab 04/27/16 1228 04/28/16 0448 04/29/16 0539  WBC 17.9* 11.6* 8.0  NEUTROABS  12.4* 8.4*  --   HGB 15.9* 13.5 12.5  HCT 47.9* 41.9 38.5  MCV 96.6 99.1 97.5  PLT 253 186 146*   CBG:  Recent Labs Lab 05/01/16 0200 05/01/16 0543 05/01/16 0753 05/01/16 1145 05/01/16 1655  GLUCAP 162* 120* 142* 119* 176*       Signed:  Erick Blinks, MD  [facility] 05/01/2016, 5:21 PM  By signing my name below, I, Zadie Cleverly, attest that this documentation has been prepared under the direction and in the presence of Erick Blinks, MD. Electronically signed: Zadie Cleverly, Scribe. 05/01/2016 10:48am   I, Dr. Erick Blinks, personally performed the services described in this documentaiton. All medical record entries made by the scribe were at my direction and in my presence. I have reviewed the chart and agree that the record reflects my personal performance and is accurate and complete  Erick Blinks, MD, 05/01/2016 5:21 PM

## 2016-05-01 NOTE — Progress Notes (Signed)
SURGICAL PROGRESS NOTE   Hospital Day(s): 4.   Post op day(s):  Marland Kitchen.   Interval History: Patient seen and examined, no acute events or new complaints overnight. Patient continues to pass flatus with BM's, has tolerated clear liquids well, has been ambulating, reports being hungry, and denies pain or nausea.  Review of Systems:  Constitutional: denies fever, chills  HEENT: denies cough or congestion  Respiratory: denies any shortness of breath  Cardiovascular: denies chest pain or palpitations  Gastrointestinal: denies N/V, diarrhea or constipation  Musculoskeletal: denies pain, decreased motor or sensation  Neurological: denies HA or vision/hearing changes   Vital signs in last 24 hours: [min-max] current  Temp:  [98 F (36.7 C)-98.8 F (37.1 C)] 98.4 F (36.9 C) (05/21 0540) Pulse Rate:  [57-64] 64 (05/21 0540) Resp:  [20] 20 (05/21 0540) BP: (128-150)/(72-78) 145/72 mmHg (05/21 0540) SpO2:  [97 %-100 %] 98 % (05/21 0540)     Height: 5' (152.4 cm) Weight: 84.454 kg (186 lb 3 oz) BMI (Calculated): 36.4   Intake/Output this shift:      Intake/Output last 2 shifts:  @IOLAST2SHIFTS @   Physical Exam:  Constitutional: alert, cooperative and no distress  HENT: normocephalic without obvious abnormality  Eyes: PERRL, EOM's grossly intact and symmetric  Neuro: CN II - XII grossly intact and symmetric without deficit  Respiratory: breathing non-labored at rest  Cardiovascular: regular rate and sinus rhythm  Gastrointestinal: obese, soft, non-tender, and non-distended  Musculoskeletal: UE and LE FROM, no edema or wounds, motor and sensation grossly intact, NT    Labs:  CBC:  Lab Results  Component Value Date   WBC 8.0 04/29/2016   RBC 3.95 04/29/2016   BMP:  Lab Results  Component Value Date   GLUCOSE 110* 04/29/2016   CO2 27 04/29/2016   BUN 10 04/29/2016   CREATININE 0.78 04/29/2016   CALCIUM 8.9 04/29/2016     Imaging studies: No new pertinent imaging studies     Assessment/Plan:  75 y.o. 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  - agree with advancing diet as tolerated, avoid narcotics - discharge planning and medical management of comorbidities as per medical team - ambulation encouraged  All of the above findings and recommendations were discussed with the patient and the medical team, and all of the patient's questions were answered to her expressed satisfaction.  -- Scherrie GerlachJason E. Earlene Plateravis, MD [place]: [facility] Office: [phone]

## 2016-05-01 NOTE — Progress Notes (Signed)
1755 d/c instructions and paperwork given to patient & patient's daughter [name] [name](Patty). IV catheter removed from LEFT FA, intact w/no s/s of infection noted. Patient assisted to vehicle via w/c by staff.

## 2016-05-26 ENCOUNTER — Encounter: Payer: Self-pay | Admitting: Adult Health

## 2016-05-26 ENCOUNTER — Ambulatory Visit (INDEPENDENT_AMBULATORY_CARE_PROVIDER_SITE_OTHER): Payer: Medicare HMO | Admitting: Adult Health

## 2016-05-26 VITALS — BP 132/80 | HR 80 | Ht 59.0 in | Wt 186.0 lb

## 2016-05-26 DIAGNOSIS — R300 Dysuria: Secondary | ICD-10-CM

## 2016-05-26 DIAGNOSIS — R319 Hematuria, unspecified: Secondary | ICD-10-CM | POA: Diagnosis not present

## 2016-05-26 LAB — POCT URINALYSIS DIPSTICK
GLUCOSE UA: NEGATIVE
NITRITE UA: NEGATIVE
Protein, UA: NEGATIVE

## 2016-05-26 MED ORDER — CIPROFLOXACIN HCL 500 MG PO TABS: 500.0000 mg | ORAL_TABLET | Freq: Two times a day (BID) | ORAL | Status: DC

## 2016-05-26 NOTE — Progress Notes (Signed)
Subjective:     Patient ID: [name] [name], female   DOB: 1941/08/03, 75 y.o.   MRN: [id number]  HPI [name] [name] is a 75 year old white female in complaining of burning with urination.   Review of Systems + burning with urination   Reviewed past medical,surgical, social and family history. Reviewed medications and allergies.     Objective:   Physical Exam BP 132/80 mmHg  Pulse 80  Ht 4\' 11"  (1.499 m)  Wt 186 lb (84.369 kg)  BMI 37.55 kg/m2 urine trace blood and trace leuks, Skin warm and dry.Pelvic: external genitalia is normal in appearance no lesions, vagina: atrophic,urethra has no lesions or masses noted, cervix and uterus are absent,adnexa: no masses or tenderness noted. Bladder is mildly tender and no masses felt.Abdomen is soft and non tender.   No CVAT.  Assessment:     Burning with urination  Hematuria     Plan:      UA C&S sent  Rx cipro 500 mg 1 bid x 3 days Drink more water Follow up prn

## 2016-05-26 NOTE — Patient Instructions (Signed)
Urinary Tract Infection Urinary tract infections (UTIs) can develop anywhere along your urinary tract. Your urinary tract is your body's drainage system for removing wastes and extra water. Your urinary tract includes two kidneys, two ureters, a bladder, and a urethra. Your kidneys are a pair of bean-shaped organs. Each kidney is about the size of your fist. They are located below your ribs, one on each side of your spine. CAUSES Infections are caused by microbes, which are microscopic organisms, including fungi, viruses, and bacteria. These organisms are so small that they can only be seen through a microscope. Bacteria are the microbes that most commonly cause UTIs. SYMPTOMS  Symptoms of UTIs may vary by age and gender of the patient and by the location of the infection. Symptoms in young women typically include a frequent and intense urge to urinate and a painful, burning feeling in the bladder or urethra during urination. Older women and men are more likely to be tired, shaky, and weak and have muscle aches and abdominal pain. A fever may mean the infection is in your kidneys. Other symptoms of a kidney infection include pain in your back or sides below the ribs, nausea, and vomiting. DIAGNOSIS To diagnose a UTI, your caregiver will ask you about your symptoms. Your caregiver will also ask you to provide a urine sample. The urine sample will be tested for bacteria and white blood cells. White blood cells are made by your body to help fight infection. TREATMENT  Typically, UTIs can be treated with medication. Because most UTIs are caused by a bacterial infection, they usually can be treated with the use of antibiotics. The choice of antibiotic and length of treatment depend on your symptoms and the type of bacteria causing your infection. HOME CARE INSTRUCTIONS  If you were prescribed antibiotics, take them exactly as your caregiver instructs you. Finish the medication even if you feel better after  you have only taken some of the medication.  Drink enough water and fluids to keep your urine clear or pale yellow.  Avoid caffeine, tea, and carbonated beverages. They tend to irritate your bladder.  Empty your bladder often. Avoid holding urine for long periods of time.  Empty your bladder before and after sexual intercourse.  After a bowel movement, women should cleanse from front to back. Use each tissue only once. SEEK MEDICAL CARE IF:   You have back pain.  You develop a fever.  Your symptoms do not begin to resolve within 3 days. SEEK IMMEDIATE MEDICAL CARE IF:   You have severe back pain or lower abdominal pain.  You develop chills.  You have nausea or vomiting.  You have continued burning or discomfort with urination. MAKE SURE YOU:   Understand these instructions.  Will watch your condition.  Will get help right away if you are not doing well or get worse.   This information is not intended to replace advice given to you by your health care provider. Make sure you discuss any questions you have with your health care provider.   Document Released: 09/07/2005 Document Revised: 08/19/2015 Document Reviewed: 01/06/2012 Elsevier Interactive Patient Education 2016 [employer] Push fluids

## 2016-05-27 LAB — URINALYSIS, ROUTINE W REFLEX MICROSCOPIC
Bilirubin, UA: NEGATIVE
GLUCOSE, UA: NEGATIVE
Ketones, UA: NEGATIVE
Nitrite, UA: NEGATIVE
PROTEIN UA: NEGATIVE
RBC, UA: NEGATIVE
Specific Gravity, UA: 1.024 (ref 1.005–1.030)
Urobilinogen, Ur: 0.2 mg/dL (ref 0.2–1.0)
pH, UA: 6 (ref 5.0–7.5)

## 2016-05-27 LAB — MICROSCOPIC EXAMINATION: Casts: NONE SEEN /lpf

## 2016-05-28 LAB — URINE CULTURE

## 2016-05-30 ENCOUNTER — Ambulatory Visit: Payer: Medicare HMO | Admitting: Orthopedic Surgery

## 2016-06-22 ENCOUNTER — Other Ambulatory Visit: Payer: Self-pay | Admitting: Internal Medicine

## 2016-06-22 DIAGNOSIS — Z1231 Encounter for screening mammogram for malignant neoplasm of breast: Secondary | ICD-10-CM

## 2016-07-01 ENCOUNTER — Ambulatory Visit: Payer: Medicare HMO

## 2016-07-04 ENCOUNTER — Ambulatory Visit
Admission: RE | Admit: 2016-07-04 | Discharge: 2016-07-04 | Disposition: A | Discharge status: Home / Self Care | Payer: Medicare (Managed Care) | Source: Ambulatory Visit | Attending: Internal Medicine | Admitting: Internal Medicine

## 2016-07-04 DIAGNOSIS — Z1231 Encounter for screening mammogram for malignant neoplasm of breast: Secondary | ICD-10-CM

## 2016-07-18 DIAGNOSIS — H903 Sensorineural hearing loss, bilateral: Secondary | ICD-10-CM | POA: Insufficient documentation

## 2016-07-18 DIAGNOSIS — H6123 Impacted cerumen, bilateral: Secondary | ICD-10-CM | POA: Insufficient documentation

## 2016-11-01 ENCOUNTER — Encounter: Payer: Self-pay | Admitting: Physical Medicine & Rehabilitation

## 2016-12-19 ENCOUNTER — Ambulatory Visit: Payer: Medicare (Managed Care) | Admitting: Physical Medicine & Rehabilitation

## 2017-01-13 ENCOUNTER — Ambulatory Visit: Payer: Medicare (Managed Care)

## 2017-01-13 ENCOUNTER — Ambulatory Visit: Payer: Medicare (Managed Care) | Admitting: Physical Medicine & Rehabilitation

## 2017-06-27 ENCOUNTER — Other Ambulatory Visit: Payer: Self-pay | Admitting: Physical Medicine and Rehabilitation

## 2017-06-27 DIAGNOSIS — M545 Low back pain: Secondary | ICD-10-CM

## 2017-07-02 ENCOUNTER — Encounter (HOSPITAL_COMMUNITY): Payer: Self-pay

## 2017-07-02 ENCOUNTER — Emergency Department (HOSPITAL_COMMUNITY)
Admission: EM | Admit: 2017-07-02 | Discharge: 2017-07-02 | Disposition: A | Discharge status: Home / Self Care | Payer: Medicare (Managed Care) | Attending: Emergency Medicine | Admitting: Emergency Medicine

## 2017-07-02 ENCOUNTER — Emergency Department (HOSPITAL_COMMUNITY): Payer: Medicare (Managed Care)

## 2017-07-02 DIAGNOSIS — E119 Type 2 diabetes mellitus without complications: Secondary | ICD-10-CM | POA: Insufficient documentation

## 2017-07-02 DIAGNOSIS — M5432 Sciatica, left side: Secondary | ICD-10-CM | POA: Diagnosis not present

## 2017-07-02 DIAGNOSIS — M25552 Pain in left hip: Secondary | ICD-10-CM

## 2017-07-02 DIAGNOSIS — I10 Essential (primary) hypertension: Secondary | ICD-10-CM | POA: Insufficient documentation

## 2017-07-02 DIAGNOSIS — M545 Low back pain: Secondary | ICD-10-CM | POA: Diagnosis present

## 2017-07-02 HISTORY — DX: Polyneuropathy, unspecified: G62.9

## 2017-07-02 LAB — URINALYSIS, ROUTINE W REFLEX MICROSCOPIC
BILIRUBIN URINE: NEGATIVE
Bacteria, UA: NONE SEEN
Hgb urine dipstick: NEGATIVE
KETONES UR: NEGATIVE mg/dL
LEUKOCYTES UA: NEGATIVE
Nitrite: NEGATIVE
PH: 5 (ref 5.0–8.0)
PROTEIN: NEGATIVE mg/dL
Specific Gravity, Urine: 1.014 (ref 1.005–1.030)

## 2017-07-02 MED ORDER — OXYCODONE HCL 5 MG PO TABS: 5.0000 mg | ORAL_TABLET | Freq: Four times a day (QID) | ORAL | 0 refills | Status: AC | PRN

## 2017-07-02 MED ORDER — OXYCODONE-ACETAMINOPHEN 5-325 MG PO TABS
1.0000 | ORAL_TABLET | Freq: Once | ORAL | Status: AC
Start: 2017-07-02 — End: 2017-07-02
  Administered 2017-07-02: 1 via ORAL
  Filled 2017-07-02: qty 1

## 2017-07-02 NOTE — ED Triage Notes (Signed)
Ems reports pt has sciatica.  Reports was laying in bed and pain flared up.  Reports is scheduled for an MRI in Aug. Denies any  Problems with bowels or bladder. Reports pain radiates down left leg.

## 2017-07-02 NOTE — ED Provider Notes (Signed)
Emergency Department Provider Note   I have reviewed the triage vital signs and the nursing notes.   HISTORY  Chief Complaint Back Pain   HPI [name] is a 76 y.o. 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 The patient reports a Yamari Ventola history of lower back and left leg discomfort. She's been following up primary care physician who has ordered an outpatient MRI for early August. The patient states that her pain has been worsening over the past 2 days and became more severe this afternoon. She called 911 and was transported to the ED. She denies any abdominal or back pain. The pain begins in the right buttock and radiates down the back of the LLE. She denies any fevers or chills. She has baseline numbness in the leg. No worsening weakness. She's been taking her Lyrica for pain with no significant improvement. Pain worse with movement.   Past Medical History:  Diagnosis Date  . Anxiety   . Bowel obstruction (HCC)   . Burning with urination 01/04/2016  . Diabetes mellitus without complication (HCC)   . Genital herpes   . GERD (gastroesophageal reflux disease)   . Hematuria 01/04/2016  . Herpes 12/22/2015  . Hyperlipidemia   . Hypertension   . LLQ abdominal tenderness 01/04/2016  . Neuropathy   . Pelvic pressure in female 01/04/2016  . Recurrent UTI   . Sciatic nerve disease   . Urinary frequency 01/04/2016    Patient Active Problem List   Diagnosis Date Noted  . Small bowel obstruction (HCC) 04/27/2016  . Non-insulin dependent type 2 diabetes mellitus (HCC) 04/27/2016  . Depression 04/27/2016  . Anxiety state 04/27/2016  . GERD (gastroesophageal reflux disease) 04/27/2016  . SBO (small bowel obstruction) (HCC) 04/27/2016  . Influenza A 02/04/2016  . Dehydration 02/03/2016  . Generalized weakness 02/03/2016  . Acute bronchitis 02/03/2016  . Acute encephalopathy 02/03/2016  . Essential  hypertension 02/03/2016  . Urinary frequency 01/04/2016  . Burning with urination 01/04/2016  . Pelvic pressure in female 01/04/2016  . Hematuria 01/04/2016  . LLQ abdominal tenderness 01/04/2016  . Herpes 12/22/2015    Past Surgical History:  Procedure Laterality Date  . ABDOMINAL HYSTERECTOMY    . bowel obstruction    . COLON SURGERY     blocked colon    Current Outpatient Rx  . Order #: [id number] Class: Historical Med  . Order #: [id number] Class: Historical Med  . Order #: [id number] Class: Historical Med  . Order #: [id number] Class: Historical Med  . Order #: [id number] Class: Historical Med  . Order #: [id number] Class: Historical Med  . Order #: [id number] Class: Historical Med  . Order #: [id number] Class: Historical Med  . Order #: [id number] Class: Normal  . Order #: [id number] Class: Historical Med  . Order #: [id number] Class: Historical Med  . Order #: [id number] Class: Historical Med  . Order #: [id number] Class: Historical Med  . Order #: [id number] Class: Print    Allergies Penicillins and Sulfa antibiotics  Family History  Problem Relation Age of Onset  . Diabetes Mother   . Heart disease Brother   . Hypertension Brother   . Diabetes Brother   . Hypertension Daughter   . Diabetes Brother   . Diabetes Brother   . Alzheimer's disease Brother   . Anxiety disorder Daughter     Social History Social History  Substance Use Topics  . Smoking status: Never Smoker  . Smokeless tobacco: Current User  Types: Snuff  . Alcohol use No    Review of Systems  Constitutional: No fever/chills Eyes: No visual changes. ENT: No sore throat. Cardiovascular: Denies chest pain. Respiratory: Denies shortness of breath. Gastrointestinal: No abdominal pain.  No nausea, no vomiting.  No diarrhea.  No constipation. Genitourinary: Negative for dysuria. Musculoskeletal: Negative for back pain. Positive buttock pain radiating to the left leg.  Skin: Negative for rash. Neurological:  Negative for headaches, focal weakness or numbness.  10-point ROS otherwise negative.  ____________________________________________   PHYSICAL EXAM:  VITAL SIGNS: ED Triage Vitals  Enc Vitals Group     BP 07/02/17 1402 (!) 126/51     Pulse Rate 07/02/17 1402 74     Resp 07/02/17 1402 20     Temp 07/02/17 1402 97.9 F (36.6 C)     Temp Source 07/02/17 1402 Oral     SpO2 07/02/17 1402 98 %     Weight 07/02/17 1402 180 lb (81.6 kg)     Height 07/02/17 1402 5' (1.524 m)     Pain Score 07/02/17 1401 8   Constitutional: Alert and oriented. Well appearing and in no acute distress. Eyes: Conjunctivae are normal.  Head: Atraumatic. Nose: No congestion/rhinnorhea. Mouth/Throat: Mucous membranes are moist.  Oropharynx non-erythematous. Neck: No stridor.  Cardiovascular: Normal rate, regular rhythm. Good peripheral circulation. Grossly normal heart sounds.   Respiratory: Normal respiratory effort.  No retractions. Lungs CTAB. Gastrointestinal: Soft and nontender. No distention.  Musculoskeletal: No lower extremity tenderness nor edema. No gross deformities of extremities. Normal ROM of the left hip.  Neurologic:  Normal speech and language. Normal strength in B/L LEs. Decreased sensation to light touch in the LLE. 2+ patellar reflexes bilaterally.  Skin:  Skin is warm, dry and intact. No rash noted.  ____________________________________________   LABS (all labs ordered are listed, but only abnormal results are displayed)  Labs Reviewed  URINALYSIS, ROUTINE W REFLEX MICROSCOPIC - Abnormal; Notable for the following:       Result Value   Glucose, UA >=500 (*)    Squamous Epithelial / LPF 0-5 (*)    All other components within normal limits  URINE CULTURE   ____________________________________________  RADIOLOGY  Dg Hip Unilat W Or Wo Pelvis 2-3 Views Left  Result Date: 07/02/2017 CLINICAL DATA:  Left hip pain.  No injury. EXAM: DG HIP (WITH OR WITHOUT PELVIS) 2-3V LEFT  COMPARISON:  01/18/2016 FINDINGS: Moderate degenerative change of the spine. Several surgical clips over the pelvis unchanged. No acute fracture or dislocation. IMPRESSION: No acute findings. Electronically Signed   By: Elberta Fortis M.D.   On: 07/02/2017 15:21    ____________________________________________   PROCEDURES  Procedure(s) performed:   Procedures  None ____________________________________________   INITIAL IMPRESSION / ASSESSMENT AND PLAN / ED COURSE  Pertinent labs & imaging results that were available during my care of the patient were reviewed by me and considered in my medical decision making (see chart for details).  Patient with history of sciatica presents to the emergency department with sciatica flare symptoms. She has diminished sensation to light touch in the left lower extremity which she describes as normal for her. She states that this was very severe pain but otherwise not new or different when compared to prior sciatica flares. She has an MRI scheduled for August 3 through her primary care physician. There are no positive red flag signs or symptoms to increase my suspicion for spinal cord emergency. No indication for emergent MRI of the lumbar spine  at this time. Considered alternate etiologies of her pain including vascular, renal, and other MSK. Plan for UA, plain film of the left hip, and reassessment.   04:10 PM Patient with normal UA and hip x-ray negative. Patient feeling much better after Percocet. She has tolerated this well in the past. Already takes 3,000 mg of Tylenol per day so will prescribe Oxycodone for breakthrough pain. Discussed constipation risk and increased fall risk. Patient to follow up with August 3rd MRI. Daughter at bedside for discharge discussion and is in agreement with plan.   At this time, I do not feel there is any life-threatening condition present. I have reviewed and discussed all results (EKG, imaging, lab, urine as  appropriate), exam findings with patient. I have reviewed nursing notes and appropriate previous records.  I feel the patient is safe to be discharged home without further emergent workup. Discussed usual and customary return precautions. Patient and family (if present) verbalize understanding and are comfortable with this plan.  Patient will follow-up with their primary care provider. If they do not have a primary care provider, information for follow-up has been provided to them. All questions have been answered.  ____________________________________________  FINAL CLINICAL IMPRESSION(S) / ED DIAGNOSES  Final diagnoses:  Sciatica of left side  Left hip pain     MEDICATIONS GIVEN DURING THIS VISIT:  Medications  oxyCODONE-acetaminophen (PERCOCET/ROXICET) 5-325 MG per tablet 1 tablet (1 tablet Oral Given 07/02/17 1424)     NEW OUTPATIENT MEDICATIONS STARTED DURING THIS VISIT:  New Prescriptions   OXYCODONE (ROXICODONE) 5 MG IMMEDIATE RELEASE TABLET    Take 1 tablet (5 mg total) by mouth every 6 (six) hours as needed for severe pain.      Note:  This document was prepared using Dragon voice recognition software and may include unintentional dictation errors.  [name] BeneJoshua Clarion Mooneyhan, MD Emergency Medicine    Juda Toepfer, Arlyss RepressJoshua G, MD 07/02/17 [phone]

## 2017-07-02 NOTE — Discharge Instructions (Signed)
You have been seen in the Emergency Department (ED)  today for back pain.  Your workup and exam have not shown any acute abnormalities and you are likely suffering from muscle strain or possible problems with your discs, but there is no treatment that will fix your symptoms at this time.  Please take Motrin (ibuprofen) as needed for your pain according to the instructions written on the box.  Alternatively, for the next five days you can take 600mg  three times daily with meals (it may upset your stomach).  Take Oxycodone as prescribed for severe pain. Do not drink alcohol, drive or participate in any other potentially dangerous activities while taking this medication as it may make you sleepy. Do not take this medication with any other sedating medications, either prescription or over-the-counter.   This medication is an opiate (or narcotic) pain medication and can be habit forming.  Use it as little as possible to achieve adequate pain control.  Do not use or use it with extreme caution if you have a history of opiate abuse or dependence. This medication is intended for your use only - do not give any to anyone else and keep it in a secure place where nobody else, especially children, have access to it.  It will also cause or worsen constipation, so you may want to consider taking an over-the-counter stool softener while you are taking this medication.  Please follow up with your doctor as soon as possible regarding today's ED visit and your back pain.  Return to the ED for worsening back pain, fever, weakness or numbness of either leg, or if you develop either (1) an inability to urinate or have bowel movements, or (2) loss of your ability to control your bathroom functions (if you start having "accidents"), or if you develop other new symptoms that concern you.

## 2017-07-04 LAB — URINE CULTURE

## 2017-07-14 ENCOUNTER — Ambulatory Visit
Admission: RE | Admit: 2017-07-14 | Discharge: 2017-07-14 | Disposition: A | Discharge status: Home / Self Care | Payer: Medicare (Managed Care) | Source: Ambulatory Visit | Attending: Physical Medicine and Rehabilitation | Admitting: Physical Medicine and Rehabilitation

## 2017-07-14 DIAGNOSIS — M545 Low back pain: Secondary | ICD-10-CM

## 2017-07-28 ENCOUNTER — Other Ambulatory Visit (HOSPITAL_COMMUNITY): Payer: Self-pay | Admitting: Internal Medicine

## 2017-07-28 ENCOUNTER — Telehealth (HOSPITAL_COMMUNITY): Payer: Self-pay | Admitting: Speech Pathology

## 2017-07-28 ENCOUNTER — Other Ambulatory Visit: Payer: Self-pay | Admitting: Family Medicine

## 2017-07-28 DIAGNOSIS — R1310 Dysphagia, unspecified: Secondary | ICD-10-CM

## 2017-07-28 DIAGNOSIS — R1319 Other dysphagia: Secondary | ICD-10-CM

## 2017-07-28 NOTE — Telephone Encounter (Signed)
Just got off the phone with Dr. Mayford Knife office. The patient is a resident at an Adult Care Facility in [place] and will need the MBSS at [facility] or [facility]. The Center will provide the patients transportation.  Patient does not live in [place] at this time. NF

## 2017-08-04 ENCOUNTER — Ambulatory Visit (HOSPITAL_COMMUNITY): Payer: Medicare (Managed Care)

## 2017-08-04 ENCOUNTER — Other Ambulatory Visit (HOSPITAL_COMMUNITY): Payer: Medicare (Managed Care)

## 2017-08-07 ENCOUNTER — Ambulatory Visit
Admission: RE | Admit: 2017-08-07 | Discharge: 2017-08-07 | Disposition: A | Discharge status: Home / Self Care | Payer: Medicare (Managed Care) | Source: Ambulatory Visit | Attending: Family Medicine | Admitting: Family Medicine

## 2017-08-07 DIAGNOSIS — R1310 Dysphagia, unspecified: Secondary | ICD-10-CM

## 2018-04-08 IMAGING — MR MR LUMBAR SPINE W/O CM
4 of 5 series · 25 of 48 positions shown · non-contrast
Comparison: Plain films lumbar spine 04/18/2016.

CLINICAL DATA: Low back pain radiating into the left leg for 3-4
years. No known injury.

EXAM:
MRI LUMBAR SPINE WITHOUT CONTRAST
TECHNIQUE: Multiplanar, multisequence MR imaging of the lumbar spine was
performed. No intravenous contrast was administered.

[Series 3: T2 · sagittal · 4.0mm · 0.51mm/px · 6 of 15 slices shown (1 of 2)]
[im 1/15]
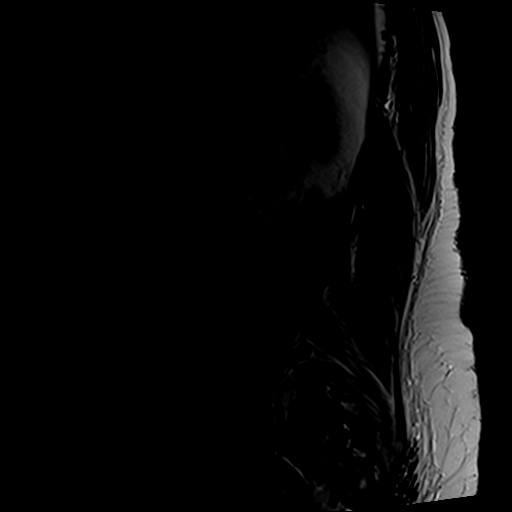
[im 3/15]
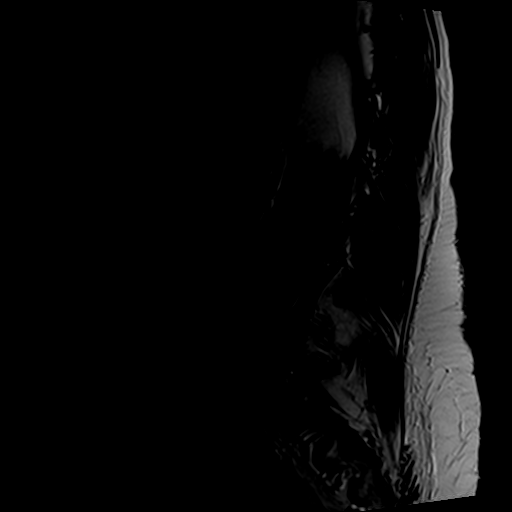
[im 6/15]
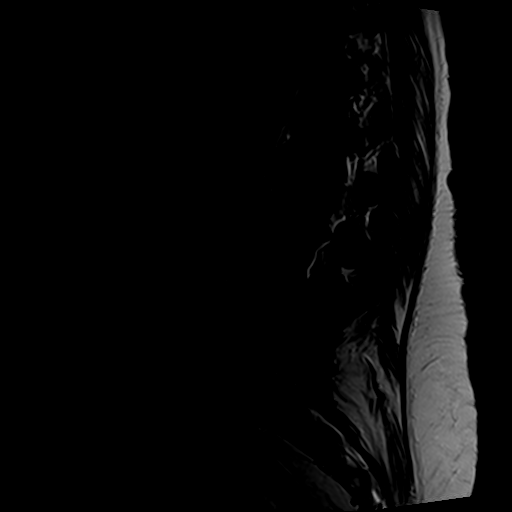
[im 9/15]
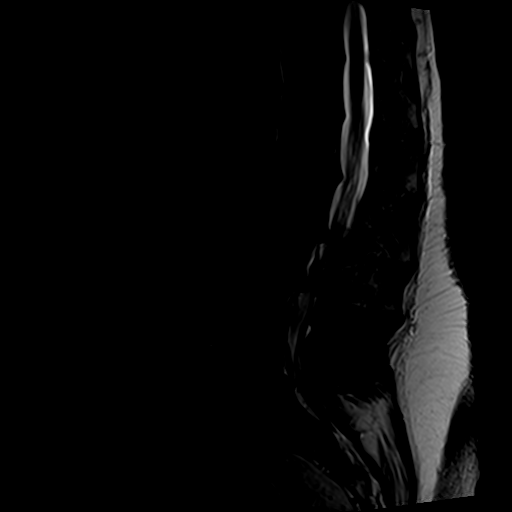
[im 12/15]
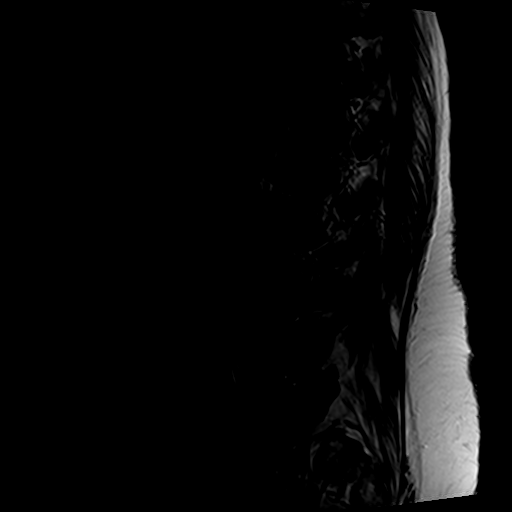
[im 15/15]
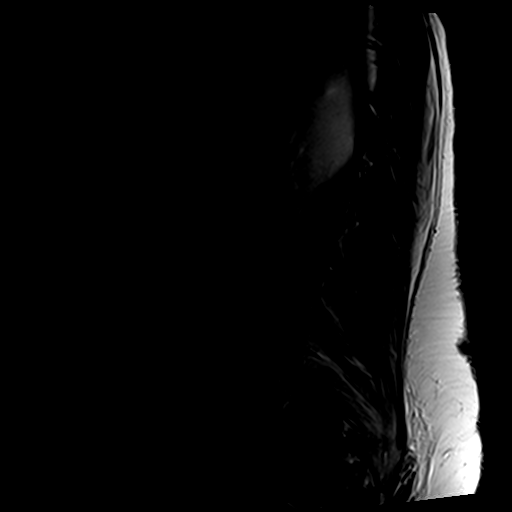

[Series 4: T1 · sagittal · 4.0mm · 0.55mm/px · 6 of 15 slices shown (1 of 2)]
[im 1/15]
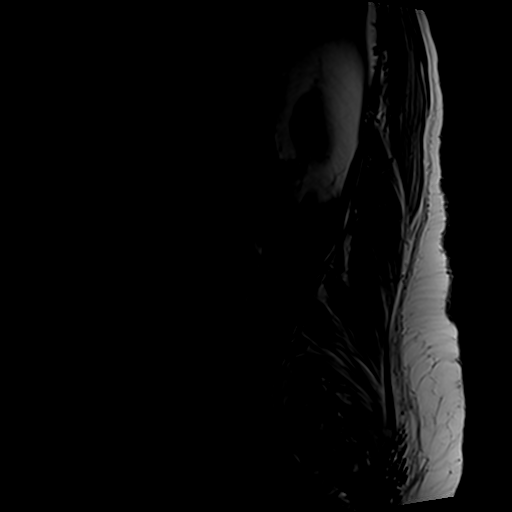
[im 3/15]
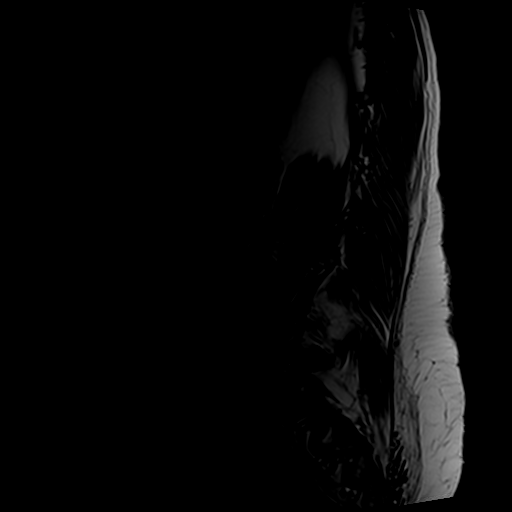
[im 6/15]
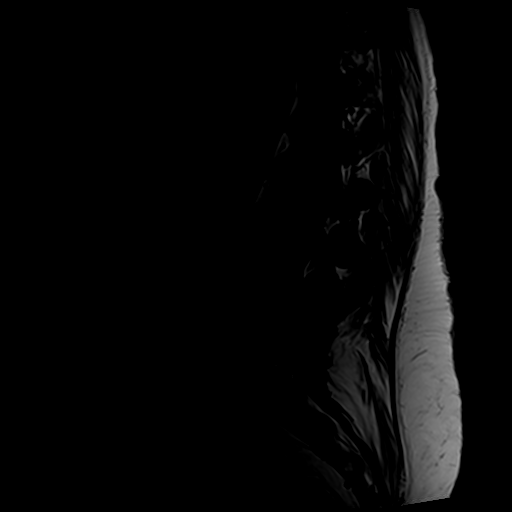
[im 9/15]
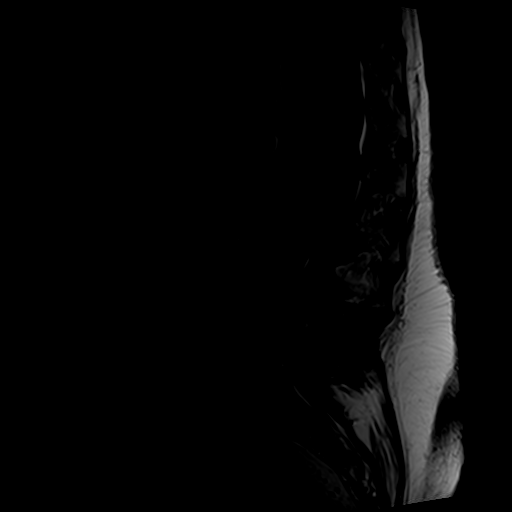
[im 12/15]
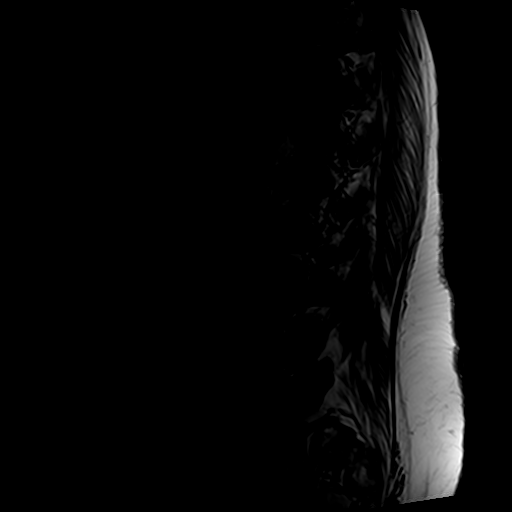
[im 15/15]
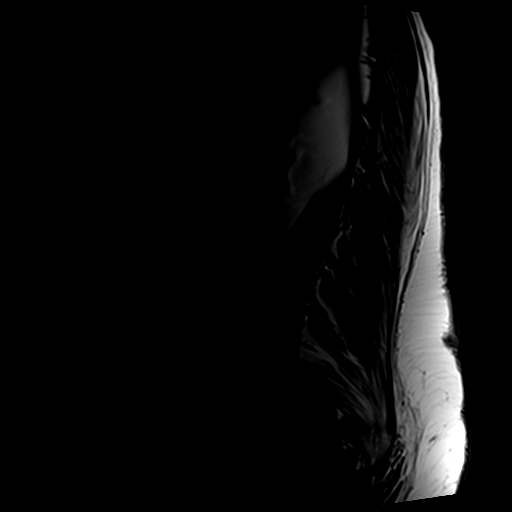

[Series 6: T2 · axial · 4.0mm · 0.70mm/px · z∈[-25,+167]mm · 9 of 36 slices shown (2 of 2)]
[im 1/36]
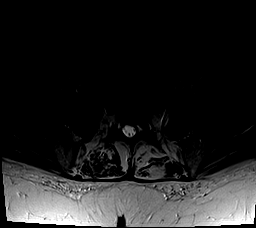
[im 6/36]
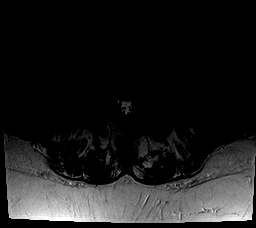
[im 11/36]
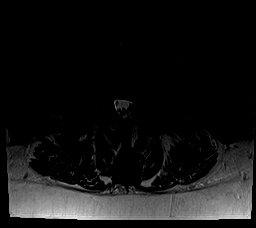
[im 16/36]
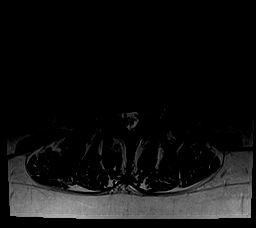
[im 18/36]
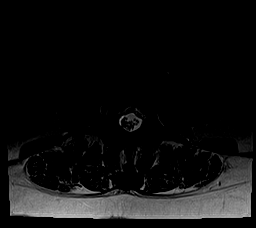
[im 21/36]
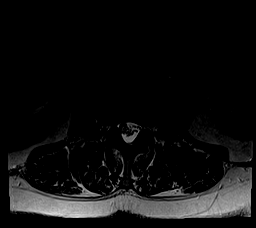
[im 26/36]
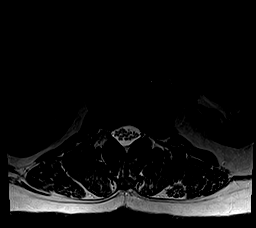
[im 31/36]
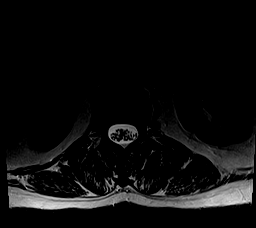
[im 36/36]
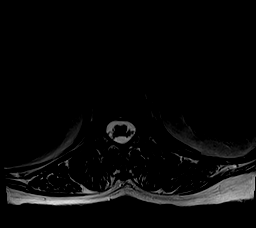

[Series 7: T1 · axial · 4.0mm · 0.35mm/px · z∈[-25,+142]mm · 4 of 36 slices shown (2 of 2)]
[im 1/36]
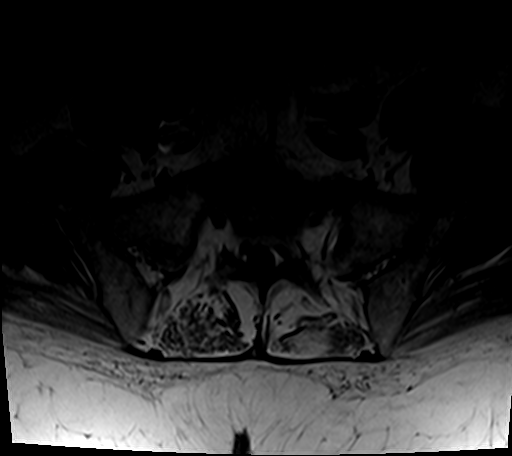
[im 6/36]
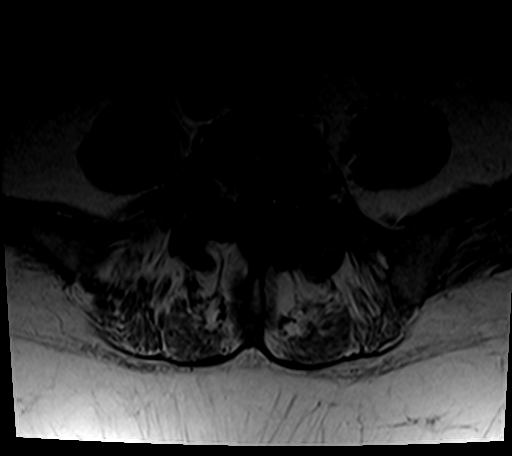
[im 18/36]
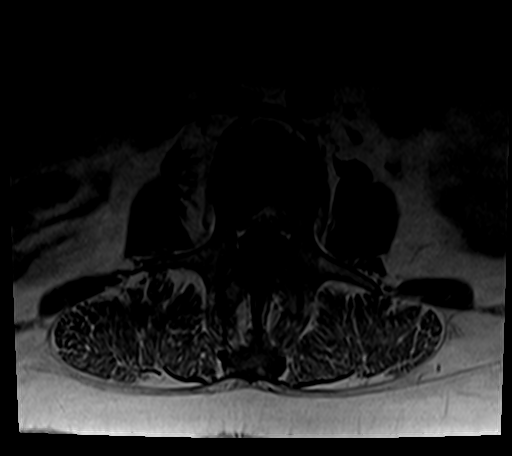
[im 31/36]
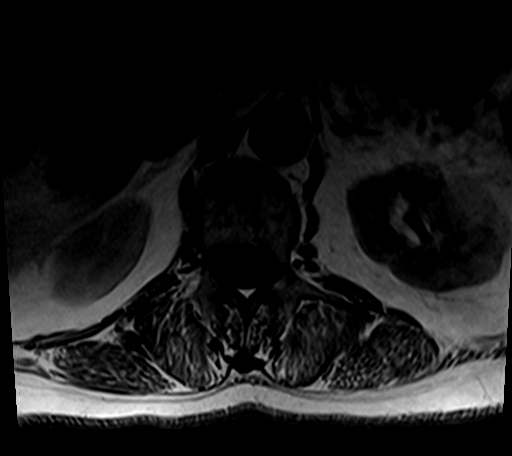

[25 of 48 positions shown; findings below may reference images not displayed]

FINDINGS: Segmentation:  Standard.

Alignment: 0.3 cm retrolisthesis L3 on L4 and L4 on L5 is
identified. Mild convex left curvature is noted.

Vertebrae: No fracture or worrisome lesion. Multilevel degenerative
endplate signal change appears worst at L3-4.

Conus medullaris: Extends to the T12-L1 level and appears normal.

Paraspinal and other soft tissues: Negative.

Disc levels:

T11-12 is imaged in the sagittal plane only. There is a shallow disc
bulge but the central canal and foramina appear open.

T12-L1: Mild disc bulge and mild-to-moderate facet degenerative
disease. The central canal and foramina are widely patent.

L1-2: Mild disc bulge and facet degenerative change. The central
canal and foramina are open.

L2-3: Disc bulge and endplate spur are somewhat more prominent to
the right. There is mild narrowing in the right subarticular recess
and foramen without nerve root compression. The left foramen is
open.

L3-4: Moderate facet degenerative change and a shallow broad-based
disc bulge are identified with endplate spurring. There is mild
central canal and left foraminal stenosis. Moderate right foraminal
narrowing is seen.

L4-5: There is left much worse than right facet arthropathy. The
patient has a large protrusion in the left foramen causing severe
foraminal stenosis and impingement on the exiting left L4 root in
conjunction with facet arthropathy. Mild narrowing is present in the
left subarticular recess. The right foramen is open.

L5-S1: Advanced bilateral facet degenerative disease is seen and
there is ligamentum flavum thickening. Shallow disc bulge is
identified. Moderate to moderately severe central canal stenosis is
present with narrowing in the subarticular recesses which appears
worse on the left. Mild to moderate foraminal narrowing is worse on
the left.
IMPRESSION: Advanced facet degenerative disease on the left and a large
foraminal protrusion cause severe foraminal stenosis and impingement
on the exiting left L4 root at L4-5. There is also some narrowing in
the left subarticular recess at this level.

Moderate to moderately severe central canal stenosis and left worse
than right subarticular recess narrowing at L5-S1 where there is a
shallow disc bulge, endplate spur and advanced facet arthropathy.

Mild central canal and left foraminal narrowing at L3-4. There is
moderate right foraminal stenosis at this level.

## 2018-04-10 ENCOUNTER — Encounter: Payer: Self-pay | Admitting: Gastroenterology

## 2018-05-30 ENCOUNTER — Other Ambulatory Visit: Payer: Self-pay | Admitting: Internal Medicine

## 2018-05-30 DIAGNOSIS — Z1231 Encounter for screening mammogram for malignant neoplasm of breast: Secondary | ICD-10-CM

## 2018-06-20 ENCOUNTER — Ambulatory Visit
Admission: RE | Admit: 2018-06-20 | Discharge: 2018-06-20 | Disposition: A | Discharge status: Home / Self Care | Payer: Medicare (Managed Care) | Source: Ambulatory Visit | Attending: Internal Medicine | Admitting: Internal Medicine

## 2018-06-20 DIAGNOSIS — Z1231 Encounter for screening mammogram for malignant neoplasm of breast: Secondary | ICD-10-CM

## 2018-07-12 ENCOUNTER — Other Ambulatory Visit: Payer: Self-pay

## 2018-07-12 ENCOUNTER — Encounter (HOSPITAL_COMMUNITY): Payer: Self-pay

## 2018-07-12 ENCOUNTER — Emergency Department (HOSPITAL_COMMUNITY)
Admission: EM | Admit: 2018-07-12 | Discharge: 2018-07-12 | Disposition: A | Discharge status: Home / Self Care | Payer: Medicare (Managed Care) | Attending: Emergency Medicine | Admitting: Emergency Medicine

## 2018-07-12 ENCOUNTER — Encounter (HOSPITAL_COMMUNITY): Payer: Self-pay | Admitting: Emergency Medicine

## 2018-07-12 ENCOUNTER — Emergency Department (HOSPITAL_COMMUNITY)
Admission: EM | Admit: 2018-07-12 | Discharge: 2018-07-12 | Disposition: A | Discharge status: Home / Self Care | Payer: Medicare (Managed Care) | Source: Home / Self Care | Attending: Emergency Medicine | Admitting: Emergency Medicine

## 2018-07-12 DIAGNOSIS — I10 Essential (primary) hypertension: Secondary | ICD-10-CM

## 2018-07-12 DIAGNOSIS — E119 Type 2 diabetes mellitus without complications: Secondary | ICD-10-CM

## 2018-07-12 DIAGNOSIS — T783XXA Angioneurotic edema, initial encounter: Secondary | ICD-10-CM | POA: Insufficient documentation

## 2018-07-12 DIAGNOSIS — E109 Type 1 diabetes mellitus without complications: Secondary | ICD-10-CM | POA: Insufficient documentation

## 2018-07-12 DIAGNOSIS — L509 Urticaria, unspecified: Secondary | ICD-10-CM

## 2018-07-12 DIAGNOSIS — Z7982 Long term (current) use of aspirin: Secondary | ICD-10-CM | POA: Insufficient documentation

## 2018-07-12 DIAGNOSIS — Z79899 Other long term (current) drug therapy: Secondary | ICD-10-CM | POA: Insufficient documentation

## 2018-07-12 DIAGNOSIS — R600 Localized edema: Secondary | ICD-10-CM | POA: Diagnosis present

## 2018-07-12 MED ORDER — DIPHENHYDRAMINE HCL 50 MG/ML IJ SOLN
25.0000 mg | Freq: Once | INTRAMUSCULAR | Status: AC
  Administered 2018-07-12: 25 mg via INTRAVENOUS
  Filled 2018-07-12: qty 1

## 2018-07-12 MED ORDER — DIPHENHYDRAMINE HCL 50 MG/ML IJ SOLN
25.0000 mg | Freq: Once | INTRAMUSCULAR | Status: AC
  Administered 2018-07-12: 25 mg via INTRAVENOUS

## 2018-07-12 MED ORDER — FAMOTIDINE 20 MG PO TABS
20.0000 mg | ORAL_TABLET | Freq: Once | ORAL | Status: AC
  Administered 2018-07-12: 20 mg via ORAL
  Filled 2018-07-12: qty 1

## 2018-07-12 MED ORDER — DOXEPIN HCL 10 MG PO CAPS: 10.0000 mg | ORAL_CAPSULE | Freq: Three times a day (TID) | ORAL | 0 refills | Status: DC | PRN

## 2018-07-12 MED ORDER — METHYLPREDNISOLONE SODIUM SUCC 125 MG IJ SOLR
125.0000 mg | Freq: Once | INTRAMUSCULAR | Status: AC
  Administered 2018-07-12: 125 mg via INTRAVENOUS

## 2018-07-12 MED ORDER — DIPHENHYDRAMINE HCL 50 MG/ML IJ SOLN
INTRAMUSCULAR | Status: AC
  Filled 2018-07-12: qty 1

## 2018-07-12 MED ORDER — FAMOTIDINE IN NACL 20-0.9 MG/50ML-% IV SOLN
20.0000 mg | Freq: Once | INTRAVENOUS | Status: AC
Start: 2018-07-12 — End: 2018-07-12
  Administered 2018-07-12: 20 mg via INTRAVENOUS

## 2018-07-12 MED ORDER — METHYLPREDNISOLONE SODIUM SUCC 125 MG IJ SOLR
INTRAMUSCULAR | Status: AC
  Filled 2018-07-12: qty 2

## 2018-07-12 MED ORDER — HYDROXYZINE HCL 25 MG PO TABS
25.0000 mg | ORAL_TABLET | Freq: Once | ORAL | Status: AC
  Administered 2018-07-12: 25 mg via ORAL
  Filled 2018-07-12: qty 1

## 2018-07-12 MED ORDER — HYDROXYZINE HCL 25 MG PO TABS: 25.0000 mg | ORAL_TABLET | Freq: Four times a day (QID) | ORAL | 0 refills | Status: DC | PRN

## 2018-07-12 MED ORDER — FAMOTIDINE IN NACL 20-0.9 MG/50ML-% IV SOLN
INTRAVENOUS | Status: AC
  Filled 2018-07-12: qty 50

## 2018-07-12 NOTE — ED Provider Notes (Signed)
[facility] EMERGENCY DEPARTMENT Provider Note   CSN: [id number] Arrival date & time: 07/12/18  2005     History   Chief Complaint Chief Complaint  Patient presents with  . Allergic Reaction    HPI [name] [name] is a 77 y.o. female who presents to the ED for a rash. Patient reports the rash and itching started 2 days ago.  She states that she [facility] with itching and tongue and lip swelling.  At that time she is given an EpiPen and started on antihistamines. Patient was evaluated here and given IV solu-Medrol, pepcid and benadryl. Patient's family report that the symptoms have improved greatly and they have stopped the Lisinopril. Patient taking Benadryl 50 mg PO. Last dose with today at 5 pm. Patient's family ask about Atarax in the place of Benadryl because when the patient starts itching she gets very anxious and can't sleep. Family asked not to give steroids because it makes the patient disoriented.   HPI  Past Medical History:  Diagnosis Date  . Anxiety   . Bowel obstruction (HCC)   . Burning with urination 01/04/2016  . Diabetes mellitus without complication (HCC)   . Genital herpes   . GERD (gastroesophageal reflux disease)   . Hematuria 01/04/2016  . Herpes 12/22/2015  . Hyperlipidemia   . Hypertension   . LLQ abdominal tenderness 01/04/2016  . Neuropathy   . Pelvic pressure in female 01/04/2016  . Recurrent UTI   . Sciatic nerve disease   . Urinary frequency 01/04/2016    Patient Active Problem List   Diagnosis Date Noted  . Small bowel obstruction (HCC) 04/27/2016  . Non-insulin dependent type 2 diabetes mellitus (HCC) 04/27/2016  . Depression 04/27/2016  . Anxiety state 04/27/2016  . GERD (gastroesophageal reflux disease) 04/27/2016  . SBO (small bowel obstruction) (HCC) 04/27/2016  . Influenza A 02/04/2016  . Dehydration 02/03/2016  . Generalized weakness 02/03/2016  . Acute bronchitis 02/03/2016  . Acute encephalopathy  02/03/2016  . Essential hypertension 02/03/2016  . Urinary frequency 01/04/2016  . Burning with urination 01/04/2016  . Pelvic pressure in female 01/04/2016  . Hematuria 01/04/2016  . LLQ abdominal tenderness 01/04/2016  . Herpes 12/22/2015    Past Surgical History:  Procedure Laterality Date  . ABDOMINAL HYSTERECTOMY    . bowel obstruction    . COLON SURGERY     blocked colon     OB History    Gravida  3   Para  3   Term      Preterm      AB      Living  3     SAB      TAB      Ectopic      Multiple      Live Births               Home Medications    Prior to Admission medications   Medication Sig Start Date End Date Taking? Authorizing Provider  acetaminophen (TYLENOL) 500 MG tablet Take 1,000 mg by mouth 3 (three) times daily.     [provider]  antiseptic oral rinse (BIOTENE) LIQD 15 mLs by Mouth Rinse route as needed for dry mouth.    [provider]  aspirin EC 81 MG tablet Take 81 mg by mouth daily.    [provider]  doxepin (SINEQUAN) 10 MG capsule Take 1 capsule (10 mg total) by mouth 3 (three) times  daily as needed. 07/12/18   Janne Napoleon, NP  DULoxetine (CYMBALTA) 30 MG capsule Take 30 mg by mouth daily.    [provider]  fluticasone (FLONASE) 50 MCG/ACT nasal spray Place 2 sprays into both nostrils daily.    [provider]  hydrOXYzine (ATARAX/VISTARIL) 25 MG tablet Take 1 tablet (25 mg total) by mouth every 6 (six) hours as needed for itching. 07/12/18   Janne Napoleon, NP  lamoTRIgine (LAMICTAL) 25 MG tablet Take 25 mg by mouth daily.    [provider]  Melatonin 5 MG TABS Take 5 mg by mouth daily.    [provider]  Menthol, Topical Analgesic, (BIOFREEZE ROLL-ON COLORLESS) 4 % GEL Apply topically.    [provider]  polyethylene glycol (MIRALAX) packet Take 17 g by mouth daily as needed for mild constipation. Patient taking differently: Take 17 g by mouth daily.   05/01/16   Erick Blinks, MD  simvastatin (ZOCOR) 10 MG tablet Take 10 mg by mouth daily.     [provider]  Sodium Fluoride (PREVIDENT 5000 BOOSTER) 1.1 % PSTE Place 1 application onto teeth daily.    [provider]  traMADol (ULTRAM) 50 MG tablet Take 50 mg by mouth every 6 (six) hours as needed.    [provider]  valACYclovir (VALTREX) 1000 MG tablet Take 1,000 mg by mouth daily.  05/05/16   [provider]  gabapentin (NEURONTIN) 300 MG capsule Take 300 mg by mouth 2 (two) times daily. Reported on 02/03/2016  04/18/16  [provider]    Family History Family History  Problem Relation Age of Onset  . Diabetes Mother   . Heart disease Brother   . Hypertension Brother   . Diabetes Brother   . Hypertension Daughter   . Diabetes Brother   . Diabetes Brother   . Alzheimer's disease Brother   . Anxiety disorder Daughter     Social History Social History   Tobacco Use  . Smoking status: Never Smoker  . Smokeless tobacco: Current User    Types: Snuff  Substance Use Topics  . Alcohol use: No  . Drug use: No     Allergies   Penicillins and Sulfa antibiotics   Review of Systems Review of Systems  Skin: Positive for rash.       itching  All other systems reviewed and are negative.    Physical Exam Updated Vital Signs BP 100/70   Pulse 85   Temp 98.2 F (36.8 C)   Resp 17   SpO2 100%   Physical Exam  Constitutional: She appears well-developed and well-nourished. No distress.  HENT:  Head: Normocephalic.  Mouth/Throat: Uvula is midline, oropharynx is clear and moist and mucous membranes are normal.  Eyes: EOM are normal.  Neck: Neck supple.  Cardiovascular: Normal rate and regular rhythm.  Pulmonary/Chest: Effort normal. No respiratory distress. She has no wheezes. She has no rales.  Musculoskeletal: Normal range of motion.  Neurological: She is alert.  Skin: Skin is warm and dry. Rash noted.  Hives noted to  arms, back of neck and trunk  Nursing note and vitals reviewed.    ED Treatments / Results  Labs (all labs ordered are listed, but only abnormal results are displayed) Labs Reviewed - No data to display  Radiology No results found.  Procedures Procedures (including critical care time)  Medications Ordered in ED Medications  hydrOXYzine (ATARAX/VISTARIL) tablet 25 mg (25 mg Oral Given 07/12/18 2201)  famotidine (  PEPCID) tablet 20 mg (20 mg Oral Given 07/12/18 2207)     Initial Impression / Assessment and Plan / ED Course  I have reviewed the triage vital signs and the nursing notes. 77 year old female has been seen several times in the ED for hives which are felt to be a reaction to medication.  She has been taking diphenhydramine with only slight relief.  She is not having any difficulty breathing or swallowing.  Daughter is concerned that she gets confused with steroids.  On exam, there is no swelling of the lips, tongue, pharynx.  No difficulty with secretions.  Diffuse urticarial rash is present. She will be discharged with prescription for hydroxyzine and also prescription for doxepin to get filled if hydroxyzine is not giving adequate relief. She will continue to take Zantac 150 mg bid. She will return for any problems.     Final Clinical Impressions(s) / ED Diagnoses   Final diagnoses:  Urticaria    ED Discharge Orders        Ordered    doxepin (SINEQUAN) 10 MG capsule  3 times daily PRN     07/12/18 2231    hydrOXYzine (ATARAX/VISTARIL) 25 MG tablet  Every 6 hours PRN     07/12/18 2232       Kerrie Buffalo [place], NP 07/13/18 0111    Dione Booze, MD 07/13/18 2251

## 2018-07-12 NOTE — ED Triage Notes (Signed)
Pt is having an apparent allergic reaction to unknown substance.  Pt initially had a reaction on Tuesday and [facility] [place].  Pt started having another reaction overnight and feels like her tongue is thick and feels like her throat is closing.  Pt is itching all over as well.

## 2018-07-12 NOTE — Discharge Instructions (Signed)
Continue to take the Zantac 150 mg twice a day. Take the atarax for the itching. If the atarax does not work after a couple days you can try the Doxepin. Follow up with your doctor. Return here as needed.

## 2018-07-12 NOTE — Discharge Instructions (Signed)
Stop lisinopril and do not take ace inhibitors Please discuss with your primary care doctor Return if your have any increased swelling, difficulty, speaking or breathing.

## 2018-07-12 NOTE — ED Triage Notes (Addendum)
Pt states she noticed "welps and top lip swelling" that started on Tuesday. Pt has been seen here 2 times this week she stated. Pt is returning today because "the itching will not stop." Pt has no other complaints at this time. Pt did gtake 50mg  benadryl at 1700 this afternoon.

## 2018-07-12 NOTE — ED Provider Notes (Signed)
[facility] EMERGENCY DEPARTMENT Provider Note   CSN: [id number] Arrival date & time: 07/12/18  7829     History   Chief Complaint Chief Complaint  Patient presents with  . Allergic Reaction    HPI [name] is a 77 y.o. female.  HPI 77 year old female presents today with lip swelling and rash.  She states that she was [facility] with itching and tongue and lip swelling.  At that time she is given an EpiPen and started on antihistamines.  She has changed histamine similar symptoms had resolved yesterday.  This morning after she took her lisinopril, she noticed some lip swelling and continued rash in the groin and head area.  Not having any difficulty breathing, speaking, or swallowing.  She was seen initially by Dr. Lynelle Doctor and had medical stabilizing exam.  She was given IV Solu-Medrol, Pepcid, Benadryl prior to my evaluation.  [facility], on Tuesday, she was given a prescription for an EpiPen.  She has the EpiPen with her but did not use it.  Is been on lisinopril for extended period of time.  However, this was not listed in her initial medication list in the Past Medical History:  Diagnosis Date  . Anxiety   . Bowel obstruction (HCC)   . Burning with urination 01/04/2016  . Diabetes mellitus without complication (HCC)   . Genital herpes   . GERD (gastroesophageal reflux disease)   . Hematuria 01/04/2016  . Herpes 12/22/2015  . Hyperlipidemia   . Hypertension   . LLQ abdominal tenderness 01/04/2016  . Neuropathy   . Pelvic pressure in female 01/04/2016  . Recurrent UTI   . Sciatic nerve disease   . Urinary frequency 01/04/2016    Patient Active Problem List   Diagnosis Date Noted  . Small bowel obstruction (HCC) 04/27/2016  . Non-insulin dependent type 2 diabetes mellitus (HCC) 04/27/2016  . Depression 04/27/2016  . Anxiety state 04/27/2016  . GERD (gastroesophageal reflux disease) 04/27/2016  . SBO (small bowel obstruction) (HCC)  04/27/2016  . Influenza A 02/04/2016  . Dehydration 02/03/2016  . Generalized weakness 02/03/2016  . Acute bronchitis 02/03/2016  . Acute encephalopathy 02/03/2016  . Essential hypertension 02/03/2016  . Urinary frequency 01/04/2016  . Burning with urination 01/04/2016  . Pelvic pressure in female 01/04/2016  . Hematuria 01/04/2016  . LLQ abdominal tenderness 01/04/2016  . Herpes 12/22/2015    Past Surgical History:  Procedure Laterality Date  . ABDOMINAL HYSTERECTOMY    . bowel obstruction    . COLON SURGERY     blocked colon     OB History    Gravida  3   Para  3   Term      Preterm      AB      Living  3     SAB      TAB      Ectopic      Multiple      Live Births               Home Medications    Prior to Admission medications   Medication Sig Start Date End Date Taking? Authorizing Provider  acetaminophen (TYLENOL) 500 MG tablet Take 1,000 mg by mouth 3 (three) times daily.     [provider]  antiseptic oral rinse (BIOTENE) LIQD 15 mLs by Mouth Rinse route as needed for dry mouth.    [provider]  aspirin EC  81 MG tablet Take 81 mg by mouth daily.    [provider]  DULoxetine (CYMBALTA) 30 MG capsule Take 30 mg by mouth daily.    [provider]  fluticasone (FLONASE) 50 MCG/ACT nasal spray Place 2 sprays into both nostrils daily.    [provider]  lamoTRIgine (LAMICTAL) 25 MG tablet Take 25 mg by mouth daily.    [provider]  Melatonin 5 MG TABS Take 5 mg by mouth daily.    [provider]  Menthol, Topical Analgesic, (BIOFREEZE ROLL-ON COLORLESS) 4 % GEL Apply topically.    [provider]  polyethylene glycol (MIRALAX) packet Take 17 g by mouth daily as needed for mild constipation. Patient taking differently: Take 17 g by mouth daily.  05/01/16   Erick BlinksMemon, Jehanzeb, MD  simvastatin (ZOCOR) 10 MG tablet Take 10 mg by mouth daily.     [provider]    Sodium Fluoride (PREVIDENT 5000 BOOSTER) 1.1 % PSTE Place 1 application onto teeth daily.    [provider]  traMADol (ULTRAM) 50 MG tablet Take 50 mg by mouth every 6 (six) hours as needed.    [provider]  valACYclovir (VALTREX) 1000 MG tablet Take 1,000 mg by mouth daily.  05/05/16   [provider]  gabapentin (NEURONTIN) 300 MG capsule Take 300 mg by mouth 2 (two) times daily. Reported on 02/03/2016  04/18/16  [provider]    Family History Family History  Problem Relation Age of Onset  . Diabetes Mother   . Heart disease Brother   . Hypertension Brother   . Diabetes Brother   . Hypertension Daughter   . Diabetes Brother   . Diabetes Brother   . Alzheimer's disease Brother   . Anxiety disorder Daughter     Social History Social History   Tobacco Use  . Smoking status: Never Smoker  . Smokeless tobacco: Current User    Types: Snuff  Substance Use Topics  . Alcohol use: No  . Drug use: No     Allergies   Penicillins and Sulfa antibiotics   Review of Systems Review of Systems  All other systems reviewed and are negative.    Physical Exam Updated Vital Signs BP 126/68   Pulse 72   Temp 97.8 F (36.6 C)   Resp (!) 23   Ht 1.524 m (5')   Wt 81.6 kg (180 lb)   SpO2 99%   BMI 35.15 kg/m   Physical Exam  Constitutional: She is oriented to person, place, and time. She appears well-developed and well-nourished.  HENT:  Head: Normocephalic and atraumatic.  Right Ear: External ear normal.  Left Ear: External ear normal.  Nose: Nose normal.  Eyes: Pupils are equal, round, and reactive to light. EOM are normal.  Neck: Normal range of motion. Neck supple.  Cardiovascular: Normal rate and regular rhythm.  Pulmonary/Chest: Effort normal and breath sounds normal.  Abdominal: Soft. Bowel sounds are normal.  Musculoskeletal: Normal range of motion.  Neurological: She is alert and oriented to person, place, and time.  Skin:  Skin is warm and dry. Capillary refill takes less than 2 seconds.  Rash, groin c.w. hives  Psychiatric: She has a normal mood and affect.  Nursing note and vitals reviewed.    ED Treatments / Results  Labs (all labs ordered are listed, but only abnormal results are displayed) Labs Reviewed - No data to display  EKG None  Radiology No results found.  Procedures Procedures (  including critical care time)  Medications Ordered in ED Medications  methylPREDNISolone sodium succinate (SOLU-MEDROL) 125 mg/2 mL injection 125 mg (125 mg Intravenous Given 07/12/18 0650)  diphenhydrAMINE (BENADRYL) injection 25 mg (25 mg Intravenous Given 07/12/18 0650)  famotidine (PEPCID) IVPB 20 mg premix (0 mg Intravenous Stopped 07/12/18 0710)     Initial Impression / Assessment and Plan / ED Course  I have reviewed the triage vital signs and the nursing notes.  Pertinent labs & imaging results that were available during my care of the patient were reviewed by me and considered in my medical decision making (see chart for details).     9:20 AM  Patient continues to have itching.  She has some hives a type rash in the groin and under the breasts and on her scalp.  Her upper lip is stable with no increase in swelling, but has not significantly improved.  There is no swelling in the mouth or in the submandibular area.  Her airway remained stable and she is not having difficulty speaking, breathing, or swallowing.  Plan stop lisinopril I am contacting [facility].  The phone number is [phone] Discussed with Rene Kocher- agree with d/c lisinopril     Final Clinical Impressions(s) / ED Diagnoses   Final diagnoses:  Angioedema, initial encounter    ED Discharge Orders    None       Margarita Grizzle, MD 07/12/18 0930

## 2018-07-12 NOTE — ED Provider Notes (Addendum)
MSE was initiated and I personally evaluated the patient and placed orders (if any) at  6:39 AM on July 12, 2018.  The patient appears stable so that the remainder of the MSE may be completed by another provider.  [facility].  She was seen at their urgent care on August 30 for an allergic reaction.  She was given EpiPen to use.  They did not find the reason for her allergies.  She states she started itching overnight and then about 2 hours ago felt like her upper lip was swollen, her tongue felt thick, and she was having some trouble breathing.  She denies any trouble swallowing.  She states she is still itching all over.  Patient is awake and alert, she has some diffuse redness however I do not see any urticarial lesions on her anterior trunk or extremities.  She does have some mild swelling of her central upper lip, her tongue may be a little bit swollen but if it is it is symmetrical, there does appear to be some redness and edema of her uvula.  She is not drooling and does not appear to be short of breath.  Medications  methylPREDNISolone sodium succinate (SOLU-MEDROL) 125 mg/2 mL injection 125 mg (has no administration in time range)  diphenhydrAMINE (BENADRYL) injection 25 mg (has no administration in time range)  famotidine (PEPCID) IVPB 20 mg premix (has no administration in time range)   Patient was given IV Solu-Medrol, Pepcid, and Benadryl.  I did not give epi subcu yet.  Patient denies any history of coronary artery disease or diabetes.  She states she is a non-smoker.  If her symptoms do not improve it will be considered.  Patient will be turned over to the day physician at 7 AM.  6:53 AM nurses report that daughter showed them her mother's, the patient's medication list and she is on lisinopril 2.5 mg twice a day for her blood pressure.  This will need to be stopped.  Devoria AlbeIva Eryn Krejci, MD, Concha PyoFACEP    Lummie Montijo, MD 07/12/18 [id number]    Devoria AlbeKnapp, Aswad Wandrey, MD 07/12/18 [phone]

## 2018-08-01 ENCOUNTER — Other Ambulatory Visit: Payer: Self-pay | Admitting: Gastroenterology

## 2018-08-01 ENCOUNTER — Ambulatory Visit (INDEPENDENT_AMBULATORY_CARE_PROVIDER_SITE_OTHER): Payer: Medicare (Managed Care) | Admitting: Gastroenterology

## 2018-08-01 ENCOUNTER — Telehealth: Payer: Self-pay

## 2018-08-01 ENCOUNTER — Encounter: Payer: Self-pay | Admitting: Gastroenterology

## 2018-08-01 VITALS — BP 138/80 | HR 72 | Ht 60.0 in | Wt 189.2 lb

## 2018-08-01 DIAGNOSIS — Z800 Family history of malignant neoplasm of digestive organs: Secondary | ICD-10-CM | POA: Diagnosis not present

## 2018-08-01 DIAGNOSIS — K5900 Constipation, unspecified: Secondary | ICD-10-CM

## 2018-08-01 DIAGNOSIS — Z1211 Encounter for screening for malignant neoplasm of colon: Secondary | ICD-10-CM | POA: Diagnosis not present

## 2018-08-01 MED ORDER — BISACODYL 5 MG PO TBEC: DELAYED_RELEASE_TABLET | ORAL | 0 refills | Status: DC

## 2018-08-01 MED ORDER — PEG 3350-KCL-NA BICARB-NACL 420 G PO SOLR: 4000.0000 mL | ORAL | 0 refills | Status: DC

## 2018-08-01 NOTE — Telephone Encounter (Signed)
Spoke to the nurse at [facility]. She gave me the fax # to send the Prescription for patient for bowel prep for upcoming colonoscopy. Fax went through and they will fill the prescription and assist patient with it.

## 2018-08-01 NOTE — Progress Notes (Signed)
Review of pertinent gastrointestinal problems: 1.  Elevated risk for colon cancer due to family history of colon cancer, her brother had colon cancer.  Colonoscopy May 2009 Dr. Christella Hartigan was normal.  She was recommended to have repeat colonoscopy in 5 years due to significant family history of colon cancer.   HPI: This is a very pleasant 77 year old woman who was referred to me by Wilson Singer, MD  to evaluate elevated risk for colon cancer given family history of colon cancer.    Chief complaint is family history of colon cancer  I performed a colonoscopy for her a little over 10 years ago.  See that results summarized above.  She thinks had a colonoscopy in [place] but cannot recall what was found.  She is pretty sure that that colonoscopy was over 5 years ago.    She has no troubles with her bowels other than her usual mild chronic constipation.  She does not have bleeding, she does not have significant abdominal pains or significant diarrhea.  Her weight has been more or less stable for years.   Review of systems: Pertinent positive and negative review of systems were noted in the above HPI section. All other review negative.   Past Medical History:  Diagnosis Date  . Anxiety   . Bowel obstruction (HCC)   . Burning with urination 01/04/2016  . Diabetes mellitus without complication (HCC)   . Genital herpes   . GERD (gastroesophageal reflux disease)   . Hematuria 01/04/2016  . Herpes 12/22/2015  . Hyperlipidemia   . Hypertension   . LLQ abdominal tenderness 01/04/2016  . Neuropathy   . Pelvic pressure in female 01/04/2016  . Recurrent UTI   . Sciatic nerve disease   . Urinary frequency 01/04/2016    Past Surgical History:  Procedure Laterality Date  . ABDOMINAL HYSTERECTOMY    . bowel obstruction    . COLON SURGERY     blocked colon    Current Outpatient Medications  Medication Sig Dispense Refill  . antiseptic oral rinse (BIOTENE) LIQD 15 mLs by Mouth Rinse route as  needed for dry mouth.    Marland Kitchen aspirin EC 81 MG tablet Take 81 mg by mouth daily.    . DULoxetine (CYMBALTA) 30 MG capsule Take 30 mg by mouth daily.    . fexofenadine (ALLEGRA) 180 MG tablet Take 180 mg by mouth daily.    Marland Kitchen guaiFENesin (ROBITUSSIN) 100 MG/5ML liquid Take 5 mLs by mouth 3 (three) times daily as needed for cough.    . lamoTRIgine (LAMICTAL) 25 MG tablet Take 25 mg by mouth daily.    . Menthol, Topical Analgesic, (BIOFREEZE ROLL-ON COLORLESS) 4 % GEL Apply topically.    . mometasone (NASONEX) 50 MCG/ACT nasal spray Place 2 sprays into the nose daily.    Marland Kitchen nystatin (NYSTATIN) powder Apply topically 3 (three) times daily.    . pantoprazole (PROTONIX) 20 MG tablet Take 20 mg by mouth daily.    Marland Kitchen senna-docusate (SENNALAX-S) 8.6-50 MG tablet Take 1 tablet by mouth at bedtime as needed for mild constipation.    . simvastatin (ZOCOR) 10 MG tablet Take 10 mg by mouth daily.     . Sodium Fluoride (PREVIDENT 5000 BOOSTER) 1.1 % PSTE Place 1 application onto teeth daily.    . valACYclovir (VALTREX) 1000 MG tablet Take 1,000 mg by mouth daily.      No current facility-administered medications for this visit.     Allergies as of 08/01/2018 - Review Complete 08/01/2018  Allergen Reaction Noted  . Penicillins Nausea Only 12/22/2015  . Sulfa antibiotics Nausea And Vomiting 12/22/2015    Family History  Problem Relation Age of Onset  . Diabetes Mother   . Heart disease Brother   . Hypertension Brother   . Diabetes Brother   . Hypertension Daughter   . Diabetes Brother   . Diabetes Brother   . Alzheimer's disease Brother   . Anxiety disorder Daughter     Social History   Socioeconomic History  . Marital status: Legally Separated    Spouse name: Not on file  . Number of children: Not on file  . Years of education: Not on file  . Highest education level: Not on file  Occupational History  . Not on file  Social Needs  . Financial resource strain: Not on file  . Food insecurity:     Worry: Not on file    Inability: Not on file  . Transportation needs:    Medical: Not on file    Non-medical: Not on file  Tobacco Use  . Smoking status: Never Smoker  . Smokeless tobacco: Current User    Types: Snuff  Substance and Sexual Activity  . Alcohol use: No  . Drug use: No  . Sexual activity: Never    Birth control/protection: Surgical    Comment: hyst  Lifestyle  . Physical activity:    Days per week: Not on file    Minutes per session: Not on file  . Stress: Not on file  Relationships  . Social connections:    Talks on phone: Not on file    Gets together: Not on file    Attends religious service: Not on file    Active member of club or organization: Not on file    Attends meetings of clubs or organizations: Not on file    Relationship status: Not on file  . Intimate partner violence:    Fear of current or ex partner: Not on file    Emotionally abused: Not on file    Physically abused: Not on file    Forced sexual activity: Not on file  Other Topics Concern  . Not on file  Social History Narrative  . Not on file     Physical Exam: BP 138/80   Pulse 72   Ht 5' (1.524 m)   Wt 189 lb 4 oz (85.8 kg)   BMI 36.96 kg/m  Constitutional: Walks with a walker Psychiatric: alert and oriented x3 Eyes: extraocular movements intact Mouth: oral pharynx moist, no lesions Neck: supple no lymphadenopathy Cardiovascular: heart regular rate and rhythm Lungs: clear to auscultation bilaterally Abdomen: soft, nontender, nondistended, no obvious ascites, no peritoneal signs, normal bowel sounds Extremities: no lower extremity edema bilaterally Skin: no lesions on visible extremities   Assessment and plan: 77 y.o. female with family history of colon cancer, brother  We discussed colon cancer screening recommendations and the fact that her brother was diagnosed with colon cancer in his 6250s or so.  Because of that it is generally recommended that she have colon cancer  screening about every 5 years.  She thinks she had a colonoscopy 5 or 6 years ago in [place] but I have no record of that.  She is not sure what was found or what was recommended.  I know I did a colonoscopy for her a little over 10 years ago.  Since she is fairly certain she has not had screening in over 5 years I recommend we  go ahead with a colonoscopy at her soonest convenience.  She is not ambulatory without a walker and I think it might be safest if we do this at the hospital for her.  I see no reason for any further blood tests or imaging studies prior to then.    Please see the "Patient Instructions" section for addition details about the plan.   Rob Buntinganiel Tayshawn Purnell, MD  Gastroenterology 08/01/2018, 1:49 PM  Cc: Wilson SingerGosrani, Nimish C, MD

## 2018-08-01 NOTE — Patient Instructions (Addendum)
You will be set up for a colonoscopy for significant family history of colon cancer ([facility] due to poor ambulation).  Normal BMI (Body Mass Index- based on height and weight) is between 23 and 30. Your BMI today is Body mass index is 36.96 kg/m. Marland Kitchen. Please consider follow up  regarding your BMI with your Primary Care Provider.

## 2018-08-22 ENCOUNTER — Encounter: Payer: Self-pay | Admitting: Allergy and Immunology

## 2018-08-22 ENCOUNTER — Ambulatory Visit (INDEPENDENT_AMBULATORY_CARE_PROVIDER_SITE_OTHER): Payer: Medicare (Managed Care) | Admitting: Allergy and Immunology

## 2018-08-22 VITALS — BP 132/70 | HR 86 | Resp 20 | Ht <= 58 in | Wt 186.0 lb

## 2018-08-22 DIAGNOSIS — R430 Anosmia: Secondary | ICD-10-CM

## 2018-08-22 DIAGNOSIS — K219 Gastro-esophageal reflux disease without esophagitis: Secondary | ICD-10-CM | POA: Diagnosis not present

## 2018-08-22 DIAGNOSIS — J3089 Other allergic rhinitis: Secondary | ICD-10-CM

## 2018-08-22 DIAGNOSIS — R739 Hyperglycemia, unspecified: Secondary | ICD-10-CM | POA: Diagnosis not present

## 2018-08-22 MED ORDER — PANTOPRAZOLE SODIUM 20 MG PO TBEC: 20.0000 mg | DELAYED_RELEASE_TABLET | Freq: Every day | ORAL | 5 refills | Status: DC

## 2018-08-22 MED ORDER — MONTELUKAST SODIUM 10 MG PO TABS: 10.0000 mg | ORAL_TABLET | Freq: Every day | ORAL | 5 refills | Status: DC

## 2018-08-22 MED ORDER — RANITIDINE HCL 300 MG PO TABS: 300.0000 mg | ORAL_TABLET | Freq: Every day | ORAL | 5 refills | Status: DC

## 2018-08-22 NOTE — Patient Instructions (Addendum)
  1.  Allergen avoidance measures?  2.  Treat and prevent inflammation:   A.  OTC Nasacort 1 spray each nostril 1 time per day, every day  B.  Montelukast 10 mg tablet 1 time per day, every day  3.  Treat laryngopharyngeal reflux:   A.  Slowly eliminate all forms of caffeine and chocolate consumption  B.  Continue Protonix 40 mg in a.m.  C.  Start ranitidine 300 mg in p.m.  4.  Discuss with primary care provider about hyperglycemia (Glucose=247)  5.  Return to clinic in 4 weeks or earlier if problem  6.  Obtain fall flu vaccine

## 2018-08-22 NOTE — Progress Notes (Addendum)
Dear Estevan Oaks,  Thank you for referring [name] [name] to the [facility] [place] on 08/22/2018.   Below is a summation of this patient's evaluation and recommendations.  Thank you for your referral. I will keep you informed about this patient's response to treatment.   If you have any questions please do not hesitate to contact me.   Sincerely,  Jessica Priest, MD Allergy / Immunology [facility]   ______________________________________________________________________    NEW PATIENT NOTE  Referring Provider: Estevan Oaks, NP Primary Provider: Estevan Oaks, NP Date of office visit: 08/22/2018    Subjective:   Chief Complaint:  [name] [name] (DOB: 05-26-1941) is a 77 y.o. female who presents to the clinic on 08/22/2018 with a chief complaint of Urticaria .     HPI: [name] [name] presents to this clinic in evaluation of 2 main issues.  First, she has an issue with very bad nasal congestion that wakes her up at nighttime.  She will need to usually stay up for 1 to 2 hours to have her nasal congestion resolve.  She also has sneezing and she has itchy eyes.  This has been a problem for years.  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  She does not have headaches or ugly nasal discharge or recurrent episodes of fever.  She has tried Flonase intermittently which does not help her.  She has tried Careers adviser which has not helped her.  There is no obvious provoking factors giving rise to this issue.  Second, she appears to have coughing.  She has had years of coughing.  This is a hacking cough associated with micturition.  She has constant drainage in her throat and a tickle in her throat.  She has spells of cough that are very difficult to control.  There is no associated chest tightness or shortness of breath with this coughing.  She  does have some baseline dyspnea on exertion when she can exert herself given her musculoskeletal issues.  She does have reflux.  She has burning and regurgitation which she thinks is controlled with pantoprazole.  She drinks 2-3 caffeinated sodas per day and has a chocolate popsicle 1 or 2 times per day.  Her last chest x-ray was years ago.  On 18 May 2018 she had a blood test performed which identified glucose 247 mg/DL.  Past Medical History:  Diagnosis Date  . Anxiety   . Bowel obstruction (HCC)   . Burning with urination 01/04/2016  . Diabetes mellitus without complication (HCC)   . Genital herpes   . GERD (gastroesophageal reflux disease)   . Hematuria 01/04/2016  . Herpes 12/22/2015  . Hyperlipidemia   . Hypertension   . LLQ abdominal tenderness 01/04/2016  . Neuropathy   . Pelvic pressure in female 01/04/2016  . Recurrent UTI   . Sciatic nerve disease   . Urinary frequency 01/04/2016  . Urticaria     Past Surgical History:  Procedure Laterality Date  . ABDOMINAL HYSTERECTOMY    . bowel obstruction    . COLON SURGERY     blocked colon    Allergies as of 08/22/2018      Reactions   Penicillins Nausea Only   Has patient had a PCN reaction causing immediate rash, facial/tongue/throat swelling, SOB or lightheadedness with hypotension: Yes Has patient had a PCN reaction causing  severe rash involving mucus membranes or skin necrosis: No Has patient had a PCN reaction that required hospitalization No Has patient had a PCN reaction occurring within the last 10 years: No If all of the above answers are "NO", then may proceed with Cephalosporin use.   Sulfa Antibiotics Nausea And Vomiting      Medication List      antiseptic oral rinse Liqd 15 mLs by Mouth Rinse route as needed for dry mouth.   aspirin EC 81 MG tablet Take 81 mg by mouth daily.   BIOFREEZE ROLL-ON COLORLESS 4 % Gel Generic drug:  Menthol (Topical Analgesic) Apply topically.   DULoxetine 30 MG  capsule Commonly known as:  CYMBALTA Take 30 mg by mouth daily.   fexofenadine 180 MG tablet Commonly known as:  ALLEGRA Take 180 mg by mouth daily.   guaiFENesin 100 MG/5ML liquid Commonly known as:  ROBITUSSIN Take 5 mLs by mouth 3 (three) times daily as needed for cough.   lamoTRIgine 25 MG tablet Commonly known as:  LAMICTAL Take 25 mg by mouth daily.   mometasone 50 MCG/ACT nasal spray Commonly known as:  NASONEX Place 2 sprays into the nose daily.   nystatin powder Generic drug:  nystatin Apply topically 3 (three) times daily.   pantoprazole 20 MG tablet Commonly known as:  PROTONIX Take 20 mg by mouth daily.   polyethylene glycol-electrolytes 420 g solution Commonly known as:  NuLYTELY/GoLYTELY Take 4,000 mLs by mouth as directed.   PREVIDENT 5000 BOOSTER 1.1 % Pste Generic drug:  Sodium Fluoride Place 1 application onto teeth daily.   SENNALAX-S 8.6-50 MG tablet Generic drug:  senna-docusate Take 1 tablet by mouth at bedtime as needed for mild constipation.   simvastatin 10 MG tablet Commonly known as:  ZOCOR Take 10 mg by mouth daily.   valACYclovir 1000 MG tablet Commonly known as:  VALTREX Take 1,000 mg by mouth daily.       Review of systems negative except as noted in HPI / PMHx or noted below:  Review of Systems  Constitutional: Negative.   HENT: Negative.   Eyes: Negative.   Respiratory: Negative.   Cardiovascular: Negative.   Gastrointestinal: Negative.   Genitourinary: Negative.   Musculoskeletal: Negative.   Skin: Negative.   Neurological: Negative.   Endo/Heme/Allergies: Negative.   Psychiatric/Behavioral: Negative.     Family History  Problem Relation Age of Onset  . Diabetes Mother   . Heart disease Brother   . Hypertension Brother   . Diabetes Brother   . Hypertension Daughter   . Diabetes Brother   . Diabetes Brother   . Alzheimer's disease Brother   . Anxiety disorder Daughter     Social History   Socioeconomic  History  . Marital status: Single    Spouse name: Not on file  . Number of children: Not on file  . Years of education: Not on file  . Highest education level: Not on file  Occupational History  . Not on file  Social Needs  . Financial resource strain: Not on file  . Food insecurity:    Worry: Not on file    Inability: Not on file  . Transportation needs:    Medical: Not on file    Non-medical: Not on file  Tobacco Use  . Smoking status: Never Smoker  . Smokeless tobacco: Current User    Types: Snuff  Substance and Sexual Activity  . Alcohol use: No  . Drug use: No  . Sexual  activity: Never    Birth control/protection: Surgical    Comment: hyst  Lifestyle  . Physical activity:    Days per week: Not on file    Minutes per session: Not on file  . Stress: Not on file  Relationships  . Social connections:    Talks on phone: Not on file    Gets together: Not on file    Attends religious service: Not on file    Active member of club or organization: Not on file    Attends meetings of clubs or organizations: Not on file    Relationship status: Not on file  . Intimate partner violence:    Fear of current or ex partner: Not on file    Emotionally abused: Not on file    Physically abused: Not on file    Forced sexual activity: Not on file  Other Topics Concern  . Not on file  Social History Narrative  . Not on file    Environmental and Social history  Lives in a apartment with a dry environment, no animals located inside the household, no carpet in the bedroom, no plastic on the bed, no plastic on the pillow, and no smokers located inside the household.  Objective:   Vitals:   08/22/18 0951  BP: 132/70  Pulse: 86  Resp: 20  SpO2: 98%   Height: 4\' 9"  (144.8 cm) Weight: 186 lb (84.4 kg)  Physical Exam  Constitutional:  Raspy voice  HENT:  Head: Normocephalic.  Right Ear: Tympanic membrane, external ear and ear canal normal.  Left Ear: Tympanic membrane,  external ear and ear canal normal.  Nose: Nose normal. No mucosal edema or rhinorrhea.  Mouth/Throat: Uvula is midline, oropharynx is clear and moist and mucous membranes are normal. No oropharyngeal exudate.  Eyes: Conjunctivae are normal.  Neck: Trachea normal. No tracheal tenderness present. No tracheal deviation present. No thyromegaly present.  Cardiovascular: Normal rate, regular rhythm, S1 normal, S2 normal and normal heart sounds.  No murmur heard. Pulmonary/Chest: Breath sounds normal. No stridor. No respiratory distress. She has no wheezes. She has no rales.  Musculoskeletal: She exhibits no edema.  Lymphadenopathy:       Head (right side): No tonsillar adenopathy present.       Head (left side): No tonsillar adenopathy present.    She has no cervical adenopathy.  Neurological: She is alert.  Skin: No rash noted. She is not diaphoretic. No erythema. Nails show no clubbing.    Diagnostics: Allergy skin tests were performed.  She did not demonstrate any hypersensitivity to her screening panel of aeroallergens or foods.  Results of the chest x-ray obtained 03 February 2016 identified the following:  Upright AP and lateral views of the chest. Lower lung volumes. Stable mild cardiomegaly. Other mediastinal contours are within normal limits. Visualized tracheal air column is within normal limits. Mild respiratory motion artifact. No pneumothorax, pulmonary edema, pleural effusion or definite acute pulmonary opacity. No acute osseous abnormality identified.  Assessment and Plan:    1. Perennial allergic rhinitis   2. Anosmia   3. LPRD (laryngopharyngeal reflux disease)   4. Hyperglycemia     1.  Allergen avoidance measures?  2.  Treat and prevent inflammation:   A.  OTC Nasacort 1 spray each nostril 1 time per day, every day  B.  Montelukast 10 mg tablet 1 time per day, every day  3.  Treat laryngopharyngeal reflux:   A.  Slowly eliminate all forms of caffeine and  chocolate consumption  B.  Continue Protonix 40 mg in a.m.  C.  Start ranitidine 300 mg in p.m.  4.  Discuss with primary care provider about hyperglycemia (Glucose=247)  5.  Return to clinic in 4 weeks or earlier if problem  6.  Obtain fall flu vaccine  [name] [name] appears to have significant respiratory tract inflammation and irritation.  The trigger for this issue may solely be reflux.  I have given her some anti-inflammatory agents for her airway and have started her on aggressive plan directed against reflux.  I will see her back in this clinic to assess her response to this plan.  If she continues to have complete anosmia then we may need to obtain an imaging study of her head.  I have also asked her to discuss with her primary care provider about her documented hyperglycemia this June.  Jessica Priest, MD Allergy / Immunology  [facility]

## 2018-08-23 ENCOUNTER — Encounter (HOSPITAL_COMMUNITY): Payer: Self-pay

## 2018-08-23 ENCOUNTER — Ambulatory Visit (HOSPITAL_COMMUNITY): Admit: 2018-08-23 | Payer: Medicare (Managed Care) | Admitting: Gastroenterology

## 2018-08-23 ENCOUNTER — Encounter: Payer: Self-pay | Admitting: Allergy and Immunology

## 2018-08-23 SURGERY — COLONOSCOPY WITH PROPOFOL
Anesthesia: Monitor Anesthesia Care

## 2018-08-24 ENCOUNTER — Telehealth: Payer: Self-pay

## 2018-08-24 DIAGNOSIS — T783XXD Angioneurotic edema, subsequent encounter: Secondary | ICD-10-CM

## 2018-08-24 NOTE — Telephone Encounter (Signed)
Pt PCP called stating at the pt allergy & asthma appt a couple of days ago her main complaint didn't get address at visit. She stated angio ademia in her lips, and urticia all over her body. She would like to speak with Dr. Lucie LeatherKozlow about this.  Dr. Vanessa KickJudy Williams @ [phone]

## 2018-08-28 NOTE — Telephone Encounter (Signed)
Hi Dr. Glori LuisKozlolw, did you contact Dr. Vanessa KickJudy Williams about this pt.?

## 2018-08-29 NOTE — Telephone Encounter (Signed)
Patient was notified and will try to come by Friday to have labs done

## 2018-08-29 NOTE — Telephone Encounter (Signed)
Orders were place and left detail message on patient home number to f/u labs. Patient will need to come in normal business hour.

## 2018-08-29 NOTE — Telephone Encounter (Signed)
Yes, did speak with Dr. Mayford KnifeWilliams. Please have patient obtain blood tests, C4, TSH, T4, TP and will discuss further with RV

## 2018-09-18 ENCOUNTER — Other Ambulatory Visit: Payer: Self-pay | Admitting: Nurse Practitioner

## 2018-09-18 ENCOUNTER — Ambulatory Visit
Admission: RE | Admit: 2018-09-18 | Discharge: 2018-09-18 | Disposition: A | Discharge status: Home / Self Care | Payer: Medicare (Managed Care) | Source: Ambulatory Visit | Attending: Nurse Practitioner | Admitting: Nurse Practitioner

## 2018-09-18 DIAGNOSIS — G549 Nerve root and plexus disorder, unspecified: Secondary | ICD-10-CM

## 2018-09-18 DIAGNOSIS — M4800 Spinal stenosis, site unspecified: Secondary | ICD-10-CM

## 2018-09-21 ENCOUNTER — Emergency Department (HOSPITAL_COMMUNITY): Payer: Medicare (Managed Care)

## 2018-09-21 ENCOUNTER — Encounter (HOSPITAL_COMMUNITY): Payer: Self-pay | Admitting: Emergency Medicine

## 2018-09-21 ENCOUNTER — Inpatient Hospital Stay (HOSPITAL_COMMUNITY)
Admission: EM | Admit: 2018-09-21 | Discharge: 2018-09-24 | DRG: 390 | Disposition: A | Discharge status: Home / Self Care | Payer: Medicare (Managed Care) | Attending: Internal Medicine | Admitting: Internal Medicine

## 2018-09-21 DIAGNOSIS — Z978 Presence of other specified devices: Secondary | ICD-10-CM | POA: Diagnosis not present

## 2018-09-21 DIAGNOSIS — E1140 Type 2 diabetes mellitus with diabetic neuropathy, unspecified: Secondary | ICD-10-CM | POA: Diagnosis present

## 2018-09-21 DIAGNOSIS — E119 Type 2 diabetes mellitus without complications: Secondary | ICD-10-CM | POA: Diagnosis not present

## 2018-09-21 DIAGNOSIS — K565 Intestinal adhesions [bands], unspecified as to partial versus complete obstruction: Secondary | ICD-10-CM | POA: Diagnosis present

## 2018-09-21 DIAGNOSIS — Z7984 Long term (current) use of oral hypoglycemic drugs: Secondary | ICD-10-CM | POA: Diagnosis not present

## 2018-09-21 DIAGNOSIS — Z720 Tobacco use: Secondary | ICD-10-CM | POA: Diagnosis not present

## 2018-09-21 DIAGNOSIS — Z79899 Other long term (current) drug therapy: Secondary | ICD-10-CM

## 2018-09-21 DIAGNOSIS — Z880 Allergy status to penicillin: Secondary | ICD-10-CM | POA: Diagnosis not present

## 2018-09-21 DIAGNOSIS — Z9049 Acquired absence of other specified parts of digestive tract: Secondary | ICD-10-CM | POA: Diagnosis not present

## 2018-09-21 DIAGNOSIS — F329 Major depressive disorder, single episode, unspecified: Secondary | ICD-10-CM | POA: Diagnosis present

## 2018-09-21 DIAGNOSIS — E785 Hyperlipidemia, unspecified: Secondary | ICD-10-CM | POA: Diagnosis present

## 2018-09-21 DIAGNOSIS — Z0189 Encounter for other specified special examinations: Secondary | ICD-10-CM

## 2018-09-21 DIAGNOSIS — Z882 Allergy status to sulfonamides status: Secondary | ICD-10-CM

## 2018-09-21 DIAGNOSIS — K219 Gastro-esophageal reflux disease without esophagitis: Secondary | ICD-10-CM | POA: Diagnosis present

## 2018-09-21 DIAGNOSIS — Z90710 Acquired absence of both cervix and uterus: Secondary | ICD-10-CM | POA: Diagnosis not present

## 2018-09-21 DIAGNOSIS — K56609 Unspecified intestinal obstruction, unspecified as to partial versus complete obstruction: Secondary | ICD-10-CM | POA: Diagnosis present

## 2018-09-21 DIAGNOSIS — Z7982 Long term (current) use of aspirin: Secondary | ICD-10-CM

## 2018-09-21 DIAGNOSIS — F419 Anxiety disorder, unspecified: Secondary | ICD-10-CM | POA: Diagnosis present

## 2018-09-21 DIAGNOSIS — I10 Essential (primary) hypertension: Secondary | ICD-10-CM | POA: Diagnosis present

## 2018-09-21 DIAGNOSIS — Z8719 Personal history of other diseases of the digestive system: Secondary | ICD-10-CM | POA: Diagnosis not present

## 2018-09-21 LAB — URINALYSIS, COMPLETE (UACMP) WITH MICROSCOPIC
Bilirubin Urine: NEGATIVE
GLUCOSE, UA: 50 mg/dL — AB
Ketones, ur: NEGATIVE mg/dL
Leukocytes, UA: NEGATIVE
Nitrite: POSITIVE — AB
PH: 5 (ref 5.0–8.0)
Protein, ur: NEGATIVE mg/dL
SPECIFIC GRAVITY, URINE: 1.011 (ref 1.005–1.030)

## 2018-09-21 LAB — COMPREHENSIVE METABOLIC PANEL
ALT: 11 U/L (ref 0–44)
AST: 18 U/L (ref 15–41)
Albumin: 4.1 g/dL (ref 3.5–5.0)
Alkaline Phosphatase: 74 U/L (ref 38–126)
Anion gap: 8 (ref 5–15)
BUN: 11 mg/dL (ref 8–23)
CHLORIDE: 103 mmol/L (ref 98–111)
CO2: 25 mmol/L (ref 22–32)
CREATININE: 0.84 mg/dL (ref 0.44–1.00)
Calcium: 9.2 mg/dL (ref 8.9–10.3)
Glucose, Bld: 166 mg/dL — ABNORMAL HIGH (ref 70–99)
Potassium: 3.9 mmol/L (ref 3.5–5.1)
Sodium: 136 mmol/L (ref 135–145)
Total Bilirubin: 0.6 mg/dL (ref 0.3–1.2)
Total Protein: 6.9 g/dL (ref 6.5–8.1)

## 2018-09-21 LAB — CBC WITH DIFFERENTIAL/PLATELET
Basophils Absolute: 0 10*3/uL (ref 0.0–0.1)
Basophils Relative: 0 %
EOS ABS: 0.3 10*3/uL (ref 0.0–0.5)
Eosinophils Relative: 3 %
HEMATOCRIT: 44.7 % (ref 36.0–46.0)
HEMOGLOBIN: 13.4 g/dL (ref 12.0–15.0)
LYMPHS ABS: 2.2 10*3/uL (ref 0.7–4.0)
LYMPHS PCT: 18 %
MCH: 28.2 pg (ref 26.0–34.0)
MCHC: 30 g/dL (ref 30.0–36.0)
MCV: 93.9 fL (ref 80.0–100.0)
MONO ABS: 0.9 10*3/uL (ref 0.1–1.0)
Monocytes Relative: 8 %
Neutro Abs: 8.6 10*3/uL — ABNORMAL HIGH (ref 1.7–7.7)
Neutrophils Relative %: 71 %
Platelets: 222 10*3/uL (ref 150–400)
RBC: 4.76 MIL/uL (ref 3.87–5.11)
RDW: 13.3 % (ref 11.5–15.5)
WBC: 12.2 10*3/uL — AB (ref 4.0–10.5)

## 2018-09-21 LAB — LIPASE, BLOOD: LIPASE: 58 U/L — AB (ref 11–51)

## 2018-09-21 LAB — I-STAT TROPONIN, ED: Troponin i, poc: 0 ng/mL (ref 0.00–0.08)

## 2018-09-21 LAB — CBG MONITORING, ED: Glucose-Capillary: 200 mg/dL — ABNORMAL HIGH (ref 70–99)

## 2018-09-21 LAB — I-STAT CG4 LACTIC ACID, ED: Lactic Acid, Venous: 2.88 mmol/L (ref 0.5–1.9)

## 2018-09-21 MED ORDER — FLUTICASONE PROPIONATE 50 MCG/ACT NA SUSP
1.0000 | Freq: Every day | NASAL | Status: DC
  Administered 2018-09-24: 1 via NASAL
  Filled 2018-09-21: qty 16

## 2018-09-21 MED ORDER — ENOXAPARIN SODIUM 40 MG/0.4ML ~~LOC~~ SOLN
40.0000 mg | SUBCUTANEOUS | Status: DC
  Administered 2018-09-22 – 2018-09-24 (×3): 40 mg via SUBCUTANEOUS
  Filled 2018-09-21 (×3): qty 0.4

## 2018-09-21 MED ORDER — ONDANSETRON HCL 4 MG/2ML IJ SOLN
4.0000 mg | Freq: Three times a day (TID) | INTRAMUSCULAR | Status: DC | PRN
  Administered 2018-09-22 (×2): 4 mg via INTRAVENOUS
  Filled 2018-09-21 (×3): qty 2

## 2018-09-21 MED ORDER — LACTATED RINGERS IV SOLN
INTRAVENOUS | Status: DC
  Administered 2018-09-22: via INTRAVENOUS

## 2018-09-21 MED ORDER — IOPAMIDOL (ISOVUE-370) INJECTION 76%
INTRAVENOUS | Status: AC
  Filled 2018-09-21: qty 100

## 2018-09-21 MED ORDER — LACTATED RINGERS IV BOLUS
1000.0000 mL | Freq: Once | INTRAVENOUS | Status: AC
  Administered 2018-09-21: 1000 mL via INTRAVENOUS

## 2018-09-21 MED ORDER — FENTANYL CITRATE (PF) 100 MCG/2ML IJ SOLN
100.0000 ug | Freq: Once | INTRAMUSCULAR | Status: AC
  Administered 2018-09-21: 100 ug via INTRAVENOUS
  Filled 2018-09-21: qty 2

## 2018-09-21 MED ORDER — IOPAMIDOL (ISOVUE-370) INJECTION 76%
100.0000 mL | Freq: Once | INTRAVENOUS | Status: AC | PRN
  Administered 2018-09-21: 100 mL via INTRAVENOUS

## 2018-09-21 MED ORDER — BIOTENE DRY MOUTH MT LIQD: 15.0000 mL | OROMUCOSAL | Status: DC | PRN

## 2018-09-21 MED ORDER — INSULIN ASPART 100 UNIT/ML ~~LOC~~ SOLN
0.0000 [IU] | SUBCUTANEOUS | Status: DC
  Administered 2018-09-22 – 2018-09-23 (×5): 1 [IU] via SUBCUTANEOUS
  Administered 2018-09-24: 2 [IU] via SUBCUTANEOUS
  Administered 2018-09-24: 1 [IU] via SUBCUTANEOUS

## 2018-09-21 MED ORDER — MORPHINE SULFATE (PF) 2 MG/ML IV SOLN
2.0000 mg | INTRAVENOUS | Status: DC | PRN
  Administered 2018-09-22 – 2018-09-24 (×14): 2 mg via INTRAVENOUS
  Filled 2018-09-21 (×14): qty 1

## 2018-09-21 MED ORDER — MORPHINE SULFATE (PF) 4 MG/ML IV SOLN
4.0000 mg | Freq: Once | INTRAVENOUS | Status: AC
  Administered 2018-09-21: 4 mg via INTRAVENOUS
  Filled 2018-09-21: qty 1

## 2018-09-21 NOTE — ED Notes (Signed)
Tally Joe - daughter - [phone] -- would like to be notified of results of CT.

## 2018-09-21 NOTE — ED Triage Notes (Signed)
Pt to ER for evaluation of upper epigastric abdominal pain sudden in onset described as "tearing," hx of bowel obstruction in the past. Received 100 mcg in route.

## 2018-09-21 NOTE — ED Provider Notes (Signed)
[facility] EMERGENCY DEPARTMENT Provider Note   CSN: [id number] Arrival date & time: 09/21/18  1549     History   Chief Complaint Chief Complaint  Patient presents with  . Abdominal Pain    HPI [name] [name] is a 77 y.o. female.  HPI  77 year old female with PMH sitting for T2DM, HLD, HTN, recurrent UTIs, history of SBO, history of cholecystectomy and appendectomy, presents with acute onset of periumbilical/epigastric abdominal pain, nonradiating, that waxes and wanes.  Patient states that her today she had acute onset of tearing sharp epigastric abdominal pain.  Moderate to severe intensity, nothing makes it better, nothing makes it worse, no association with foods.  Denies fever, nausea or vomiting.  Last bowel movement was after the onset of pain earlier today, and was normal without blood.  Denies melanotic stools.  Patient states feeling is different than her prior episode of SBO.  Denies chest pain or shortness of breath.  Denies lower extremity pain, or new lower extremity numbness or tingling.  Denies urinary symptoms.  Patient endorses abdominal distention.  Past Medical History:  Diagnosis Date  . Anxiety   . Bowel obstruction (HCC)   . Burning with urination 01/04/2016  . Diabetes mellitus without complication (HCC)   . Genital herpes   . GERD (gastroesophageal reflux disease)   . Hematuria 01/04/2016  . Herpes 12/22/2015  . Hyperlipidemia   . Hypertension   . LLQ abdominal tenderness 01/04/2016  . Neuropathy   . Pelvic pressure in female 01/04/2016  . Recurrent UTI   . Sciatic nerve disease   . Urinary frequency 01/04/2016  . Urticaria     Patient Active Problem List   Diagnosis Date Noted  . Small bowel obstruction (HCC) 04/27/2016  . Non-insulin dependent type 2 diabetes mellitus (HCC) 04/27/2016  . Depression 04/27/2016  . Anxiety state 04/27/2016  . GERD (gastroesophageal reflux disease) 04/27/2016  . SBO (small bowel obstruction)  (HCC) 04/27/2016  . Influenza A 02/04/2016  . Dehydration 02/03/2016  . Generalized weakness 02/03/2016  . Acute bronchitis 02/03/2016  . Acute encephalopathy 02/03/2016  . Essential hypertension 02/03/2016  . Urinary frequency 01/04/2016  . Burning with urination 01/04/2016  . Pelvic pressure in female 01/04/2016  . Hematuria 01/04/2016  . LLQ abdominal tenderness 01/04/2016  . Herpes 12/22/2015    Past Surgical History:  Procedure Laterality Date  . ABDOMINAL HYSTERECTOMY    . bowel obstruction    . COLON SURGERY     blocked colon     OB History    Gravida  3   Para  3   Term      Preterm      AB      Living  3     SAB      TAB      Ectopic      Multiple      Live Births               Home Medications    Prior to Admission medications   Medication Sig Start Date End Date Taking? Authorizing Provider  antiseptic oral rinse (BIOTENE) LIQD 15 mLs by Mouth Rinse route as needed for dry mouth.   Yes [provider]  aspirin EC 81 MG tablet Take 81 mg by mouth daily.   Yes [provider]  DULoxetine (CYMBALTA) 30 MG capsule Take 30 mg by mouth daily.   Yes [provider]  fexofenadine (ALLEGRA) 180 MG tablet Take 180  mg by mouth daily.   Yes [provider]  guaiFENesin (ROBITUSSIN) 100 MG/5ML liquid Take 5 mLs by mouth 3 (three) times daily as needed for cough.   Yes [provider]  lamoTRIgine (LAMICTAL) 25 MG tablet Take 25 mg by mouth daily.   Yes [provider]  Menthol, Topical Analgesic, (BIOFREEZE ROLL-ON COLORLESS) 4 % GEL Apply topically.   Yes [provider]  mometasone (NASONEX) 50 MCG/ACT nasal spray Place 2 sprays into the nose daily.   Yes [provider]  montelukast (SINGULAIR) 10 MG tablet Take 1 tablet (10 mg total) by mouth at bedtime. 08/22/18  Yes Kozlow, Alvira Philips, MD  nystatin (NYSTATIN) powder Apply topically daily as needed (skin irritation).    Yes  [provider]  pantoprazole (PROTONIX) 20 MG tablet Take 1 tablet (20 mg total) by mouth daily. 08/22/18  Yes Kozlow, Alvira Philips, MD  polyethylene glycol (MIRALAX / GLYCOLAX) packet Take 17 g by mouth daily.   Yes [provider]  simvastatin (ZOCOR) 10 MG tablet Take 10 mg by mouth daily.    Yes [provider]  Sodium Fluoride (PREVIDENT 5000 BOOSTER) 1.1 % PSTE Place 1 application onto teeth daily.   Yes [provider]  valACYclovir (VALTREX) 1000 MG tablet Take 1,000 mg by mouth daily.  05/05/16  Yes [provider]  polyethylene glycol-electrolytes (NULYTELY/GOLYTELY) 420 g solution Take 4,000 mLs by mouth as directed. Patient not taking: Reported on 09/21/2018 08/01/18   Rachael Fee, MD  ranitidine (ZANTAC) 300 MG tablet Take 1 tablet (300 mg total) by mouth at bedtime. Patient not taking: Reported on 09/21/2018 08/22/18   Jessica Priest, MD  gabapentin (NEURONTIN) 300 MG capsule Take 300 mg by mouth 2 (two) times daily. Reported on 02/03/2016  04/18/16  [provider]    Family History Family History  Problem Relation Age of Onset  . Diabetes Mother   . Heart disease Brother   . Hypertension Brother   . Diabetes Brother   . Hypertension Daughter   . Diabetes Brother   . Diabetes Brother   . Alzheimer's disease Brother   . Anxiety disorder Daughter     Social History Social History   Tobacco Use  . Smoking status: Never Smoker  . Smokeless tobacco: Current User    Types: Snuff  Substance Use Topics  . Alcohol use: No  . Drug use: No     Allergies   Penicillins and Sulfa antibiotics   Review of Systems Review of Systems  Constitutional: Negative for chills and fever.  HENT: Negative for ear pain and sore throat.   Eyes: Negative for pain and visual disturbance.  Respiratory: Negative for cough and shortness of breath.   Cardiovascular: Negative for chest pain and palpitations.  Gastrointestinal: Positive for  abdominal distention and abdominal pain. Negative for blood in stool, constipation, diarrhea, nausea and vomiting.  Genitourinary: Negative for dysuria and hematuria.  Musculoskeletal: Negative for arthralgias and back pain.  Skin: Negative for color change and rash.  Neurological: Negative for seizures and syncope.  All other systems reviewed and are negative.    Physical Exam Updated Vital Signs BP (!) 186/89   Pulse 70   Temp 99.3 F (37.4 C) (Oral)   Resp 20   Ht 5' (1.524 m)   Wt 81.6 kg   SpO2 97%   BMI 35.15 kg/m   Physical Exam  Constitutional: She appears well-developed and well-nourished. No distress.  HENT:  Head:  Normocephalic and atraumatic.  Eyes: Conjunctivae are normal.  Neck: Neck supple.  Cardiovascular: Normal rate and regular rhythm.  No murmur heard. Pulmonary/Chest: Effort normal and breath sounds normal. No respiratory distress.  Abdominal: Soft. She exhibits distension. Bowel sounds are increased. There is generalized tenderness and tenderness in the epigastric area, periumbilical area and suprapubic area. There is no rigidity, no rebound, no guarding, no CVA tenderness, no tenderness at McBurney's point and negative Murphy's sign.  We will do some healed abdominal scars.  Musculoskeletal: She exhibits no edema.  Neurological: She is alert.  Skin: Skin is warm and dry. Capillary refill takes less than 2 seconds.  Psychiatric: She has a normal mood and affect.  Nursing note and vitals reviewed.    ED Treatments / Results  Labs (all labs ordered are listed, but only abnormal results are displayed) Labs Reviewed  COMPREHENSIVE METABOLIC PANEL - Abnormal; Notable for the following components:      Result Value   Glucose, Bld 166 (*)    All other components within normal limits  LIPASE, BLOOD - Abnormal; Notable for the following components:   Lipase 58 (*)    All other components within normal limits  CBC WITH DIFFERENTIAL/PLATELET - Abnormal;  Notable for the following components:   WBC 12.2 (*)    Neutro Abs 8.6 (*)    All other components within normal limits  URINALYSIS, COMPLETE (UACMP) WITH MICROSCOPIC - Abnormal; Notable for the following components:   Glucose, UA 50 (*)    Hgb urine dipstick SMALL (*)    Nitrite POSITIVE (*)    Bacteria, UA RARE (*)    All other components within normal limits  I-STAT CG4 LACTIC ACID, ED - Abnormal; Notable for the following components:   Lactic Acid, Venous 2.88 (*)    All other components within normal limits  I-STAT TROPONIN, ED  I-STAT CG4 LACTIC ACID, ED    EKG EKG Interpretation  Date/Time:  Friday September 21 2018 15:56:57 EDT Ventricular Rate:  82 PR Interval:    QRS Duration: 75 QT Interval:  380 QTC Calculation: 444 R Axis:   -9 Text Interpretation:  Sinus rhythm Anteroseptal infarct, age indeterminate No significant change since last tracing Confirmed by Gwyneth Sprout ([id number]) on 09/21/2018 4:11:29 PM   Radiology Dg Chest Portable 1 View  Result Date: 09/21/2018 CLINICAL DATA:  Upper epigastric abdominal pain. EXAM: PORTABLE CHEST 1 VIEW COMPARISON:  02/03/2016 FINDINGS: Borderline cardiomegaly. Mild uncoiling of the thoracic aorta without aneurysm. Clear lungs. No acute osseous abnormality. IMPRESSION: No active disease.  No significant change. Electronically Signed   By: Tollie Eth M.D.   On: 09/21/2018 16:45   Ct Angio Chest/abd/pel For Dissection W And/or Wo Contrast  Result Date: 09/21/2018 CLINICAL DATA:  Upper epigastric and abdominal pain of sudden onset. History of bowel obstruction in the past. EXAM: CT ANGIOGRAPHY CHEST, ABDOMEN AND PELVIS TECHNIQUE: Multidetector CT imaging through the chest, abdomen and pelvis was performed using the standard protocol during bolus administration of intravenous contrast. Multiplanar reconstructed images and MIPs were obtained and reviewed to evaluate the vascular anatomy. CONTRAST:  ISOVUE-370 IOPAMIDOL  (ISOVUE-370) INJECTION 76% COMPARISON:  CT AP 04/27/2016, CXR 09/21/2018 and 09/18/2018 FINDINGS: CTA CHEST FINDINGS Cardiovascular: Noncontrast imaging through the chest demonstrates no mural hematoma of the thoracic aorta. Mild-to-moderate atherosclerosis of the thoracic aorta at the arch. Left main and LAD coronary arteriosclerosis is noted. Heart size is normal without pericardial effusion. Atherosclerosis of the great vessels. Upon contrast administration, no  significant luminal narrowing of the great vessels. No aortic dissection. Mild dilatation of the main pulmonary artery to 3.3 cm likely representing a component of chronic pulmonary hypertension. No acute pulmonary embolus. The thoracic aorta measures to 3.4 cm along the ascending portion. No aneurysm. Mediastinum/Nodes: Mildly enlarged right lower paratracheal lymph node to 13 mm short axis likely reactive. No hilar lymphadenopathy. Patent trachea and mainstem bronchi. Th trace fluid in the distal esophagus possibly from delayed esophageal emptying or reflux. Small hiatal hernia is present. Lungs/Pleura: No pulmonary consolidation, effusion or pneumothorax. Trace extrapleural fat along the posterior aspect of both lungs. Musculoskeletal: Multilevel degenerative disc disease and endplate spurring along the thoracic spine without acute fracture or suspicious osseous lesions. Discogenic sclerosis is at T1-T2 and to a lesser degree from T6 through T9. Review of the MIP images confirms the above findings. CTA ABDOMEN AND PELVIS FINDINGS VASCULAR Aorta: Nonaneurysmal minimally atherosclerotic aorta without dissection. Celiac: Normal branch pattern. No significant stenosis, dissection or aneurysm. SMA: Patent along its visualized course without thrombosis, dissection or aneurysm. Renals: Ectopic takeoff of the right single renal artery along the ventral aspect of the aorta just lateral to the take-off of the SMA. No significant stenosis or aneurysm. Conventional  takeoff of the left single renal artery with atherosclerotic origin but without significant stenosis. IMA: Patent along its visualized course. Inflow: Atherosclerotic common iliac arteries without significant stenosis, aneurysm or dissection. Veins: No obvious venous abnormality within the limitations of this arterial phase study. Review of the MIP images confirms the above findings. NON-VASCULAR Hepatobiliary: Nonvisualized gallbladder presumed surgically absent. Slight surface nodularity of the liver suspicious for morphologic changes of cirrhosis. No space-occupying mass or biliary dilatation. Pancreas: Normal Spleen: Normal Adrenals/Urinary Tract: Normal bilateral adrenal glands. Mild renal cortical thinning. Bilateral extrarenal pelves are noted right greater than left. No obstructive uropathy. The urinary bladder is unremarkable for the degree of distention. Stomach/Bowel: Dilated small bowel loops starting from the second portion of the duodenum and involving much of the jejunum with probable transition point in the left lower quadrant similar to prior and likely related to adhesions. Fecalized material is seen within jejunal loops measuring up to 3.7 cm in diameter. Stomach is decompressed in appearance and contains an average amount of fecal residue. Distal and terminal ileum are decompressed. Nonvisualized appendix. Lymphatic: No lymphadenopathy. Reproductive: Status post hysterectomy. No adnexal masses. Other: No free air nor free fluid. Musculoskeletal: No acute osseous abnormality. Degenerative disc disease with disc height loss from L2 through S1 with 2 mm of retrolisthesis of L2 on L3 and L3 on L4. Discogenic sclerosis across the L3-4 interspace. Review of the MIP images confirms the above findings. IMPRESSION: Vascular: 1. No aortic aneurysm or dissection.  No acute pulmonary embolus. 2. 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 3. Patent branch vessels off the abdominal aorta without  significant stenosis. Nonvascular: 1. No active pulmonary disease. 2. High-grade early or partial small bowel obstruction similar to prior with probable transition zone in the anterior left hemiabdomen. Dilatation and fecalized material noted within obstructed small bowel to 3.7 cm. No free air. 3. Thoracolumbar spondylosis. 4. Nonvisualized gallbladder and appendix presumed surgically absent. Status post hysterectomy. Electronically Signed   By: Tollie Eth M.D.   On: 09/21/2018 18:54    Procedures Procedures (including critical care time)  Medications Ordered in ED Medications  iopamidol (ISOVUE-370) 76 % injection (has no administration in time range)  morphine 4 MG/ML injection 4 mg (4 mg Intravenous Given 09/21/18 1624)  lactated ringers bolus 1,000 mL (0 mLs Intravenous Stopped 09/21/18 1745)  fentaNYL (SUBLIMAZE) injection 100 mcg (100 mcg Intravenous Given 09/21/18 1745)  iopamidol (ISOVUE-370) 76 % injection 100 mL (100 mLs Intravenous Contrast Given 09/21/18 1816)  fentaNYL (SUBLIMAZE) injection 100 mcg (100 mcg Intravenous Given 09/21/18 2036)     Initial Impression / Assessment and Plan / ED Course  I have reviewed the triage vital signs and the nursing notes.  Pertinent labs & imaging results that were available during my care of the patient were reviewed by me and considered in my medical decision making (see chart for details).     77 year old female with PMH sitting for T2DM, HLD, HTN, recurrent UTIs, history of SBO, history of cholecystectomy and appendectomy, presents with acute onset of abdominal pain.  DDX includes bowel obstruction, dissection, AAA, acute mesenteric ischemia, or gas.  Given IV pain medication and IV fluids.  Labs imaging performed.  Significant for lactic acidosis and leukocytosis.  Imaging reveals high-grade early or partial SBO.  No evidence of dissection.  General surgery consulted who agrees to see the patient, recommend admission to medicine  with surgery following.  Patient admitted to internal medicine teaching service due to being a pace patient.  Patient seen in conjunction with my attending Dr. Revonda Humphrey who agrees with plan and disposition.   Final Clinical Impressions(s) / ED Diagnoses   Final diagnoses:  Intestinal obstruction, unspecified cause, unspecified whether partial or complete [facility])    ED Discharge Orders    None       Margit Banda, MD 09/21/18 2157    Gwyneth Sprout, MD 09/21/18 2337

## 2018-09-21 NOTE — ED Notes (Signed)
PT GONE TO CT SCAN

## 2018-09-21 NOTE — H&P (Signed)
Date: 09/21/2018               Patient Name:  [name] [name] MRN: [id number]  DOB: 1941/07/28 Age / Sex: 77 y.o., female   PCP: Estevan Oaks, NP         Medical Service: Internal Medicine Teaching Service         Attending Physician: Dr. Gust Rung, DO    First Contact: Dr. Criss Alvine Pager: [phone]  Second Contact: Dr. Caron Presume Pager: [phone]       After Hours (After 5p/  First Contact Pager: [phone]  weekends / holidays): Second Contact Pager: [phone]   Chief Complaint: abdominal pain  History of Present Illness:  Ms Billiter is a 77yo female with PMH of T2DM, GERD, Anxiety, MDD; she presents to the ED with progressive abdominal pain.  Patient states her abd pain began this morning and worsened with eating a banana. Last BM (described as normal) was after lunch today but since then she has not had any flatus or BM since then. She was evaluated at [facility] today and given TUMs and milk of magnesia, however did not have any relief. She endorses progressive abdominal distension, pain, belching, and nausea without vomiting.  She has a h/o abdominal hysterectomy as well as a bowel resection 2/2 previous SBO.   She denies fevers, chills, chest pain, shortness of breath. She denies urinary symptoms.  In the ED, there was concern for dissection due to her initial description of pain as epigastric, tearing pain so dissection study was obtained - this revealed early complete vs partial SBO with transition point in anterior left hemiabdomen. She was afebrile, hypertensive, normocardic. Labs revealed unremarkable CMet; lipase with mild elevation to 58 (neglible); CBC with neutrophil predominant leukocytosis to 12.2; iStat trop negative; LA of 2.88. She was made NPO, pain managed, and given 1L LR bolus. Surgery was consulted.   Meds:  Current Meds  Medication Sig  . antiseptic oral rinse (BIOTENE) LIQD 15 mLs by Mouth Rinse route as needed for dry mouth.  Marland Kitchen aspirin EC 81 MG tablet  Take 81 mg by mouth daily.  . DULoxetine (CYMBALTA) 30 MG capsule Take 30 mg by mouth daily.  . fexofenadine (ALLEGRA) 180 MG tablet Take 180 mg by mouth daily.  Marland Kitchen guaiFENesin (ROBITUSSIN) 100 MG/5ML liquid Take 5 mLs by mouth 3 (three) times daily as needed for cough.  . lamoTRIgine (LAMICTAL) 25 MG tablet Take 25 mg by mouth daily.  . Menthol, Topical Analgesic, (BIOFREEZE ROLL-ON COLORLESS) 4 % GEL Apply topically.  . mometasone (NASONEX) 50 MCG/ACT nasal spray Place 2 sprays into the nose daily.  . montelukast (SINGULAIR) 10 MG tablet Take 1 tablet (10 mg total) by mouth at bedtime.  Marland Kitchen nystatin (NYSTATIN) powder Apply topically daily as needed (skin irritation).   . pantoprazole (PROTONIX) 20 MG tablet Take 1 tablet (20 mg total) by mouth daily.  . polyethylene glycol (MIRALAX / GLYCOLAX) packet Take 17 g by mouth daily.  . simvastatin (ZOCOR) 10 MG tablet Take 10 mg by mouth daily.   . Sodium Fluoride (PREVIDENT 5000 BOOSTER) 1.1 % PSTE Place 1 application onto teeth daily.  . valACYclovir (VALTREX) 1000 MG tablet Take 1,000 mg by mouth daily.    Allergies: Allergies as of 09/21/2018 - Review Complete 09/21/2018  Allergen Reaction Noted  . Penicillins Nausea Only 12/22/2015  . Sulfa antibiotics Nausea And Vomiting 12/22/2015   Past Medical History:  Diagnosis Date  . Anxiety   .  Bowel obstruction (HCC)   . Burning with urination 01/04/2016  . Diabetes mellitus without complication (HCC)   . Genital herpes   . GERD (gastroesophageal reflux disease)   . Hematuria 01/04/2016  . Herpes 12/22/2015  . Hyperlipidemia   . Hypertension   . LLQ abdominal tenderness 01/04/2016  . Neuropathy   . Pelvic pressure in female 01/04/2016  . Recurrent UTI   . Sciatic nerve disease   . Urinary frequency 01/04/2016  . Urticaria     Family History: Oldest brother had colon cancer (age late 63s)  Social History: Patient lives by herself; has CNA and daughter who provide help. She follows with  [facility]. She denies EtOH use, tobacco use or illicit drug use.  Review of Systems: A complete ROS was negative except as per HPI.   Physical Exam: Blood pressure (!) 186/89, pulse 70, temperature 99.3 F (37.4 C), temperature source Oral, resp. rate 20, height 5' (1.524 m), weight 81.6 kg, SpO2 97 %. GENERAL- alert, co-operative, appears as stated age, appears uncomfortable HEENT- oral mucosa appears moist CARDIAC- RRR, no murmurs, rubs or gallops. RESP- shallow breathing; mild bibasilar rales; no wheezing or rhonchi. ABDOMEN- Distended, vertical surgical scars at midline and LLQ; diffusely tender; high pitched, scant bowel sounds NEURO- No obvious Cr N abnormality. EXTREMITIES- pulse 2+, symmetric, minimal dependent LE edema. SKIN- Warm, dry. PSYCH- Normal mood and affect, appropriate thought content and speech.  EKG: personally reviewed my interpretation is low voltage, sinus rhythm, no acute ST or TW changes.  CXR: personally reviewed my interpretation is no evidence of infiltrate, consolidation, pneumothorax or effusion.  CTA chest/abd/pel w/ contrast: Vascular: 1. No aortic aneurysm or dissection.  No acute pulmonary embolus. 2. 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 3. Patent branch vessels off the abdominal aorta without significant stenosis.  Nonvascular: 1. No active pulmonary disease. 2. High-grade early or partial small bowel obstruction similar to prior with probable transition zone in the anterior left hemiabdomen. Dilatation and fecalized material noted within obstructed small bowel to 3.7 cm. No free air. 3. Thoracolumbar spondylosis. 4. Nonvisualized gallbladder and appendix presumed surgically absent. Status post hysterectomy  Assessment & Plan by Problem: Active Problems:   SBO (small bowel obstruction) (HCC)  Small Bowel Obstruction: Patient with prior abdominal surgeries and SBO presents today with signs/symptoms and CT  abd/pelvis consistent with SBO. She is not actively vomiting and would rather avoid NGT placement if possible so we will hold of on this for the time being. Surgery has been consulted and will follow along. --NPO --Morphine 2mg  q2hr prn for pain with holding parameters --zofran prn for nausea --f/u LA --LR 178ml/hr --f/u AM bmet and mag --mobilize as able --f/u surgery recs; hopeful for resolution with medical management, however given her history of abd surgeries she may need surgery for adhesion lysis  T2DM: Patient is not on any DM meds at home. --NPO --q4hr CBG with SSI  MDD, Anxiety: Patient on duloxetine 30mg  daily, lamotrigine 25mg  daily. Will hold in setting of NPO status with her SBO and restart as able.  IVF: LR Diet: NPO VTE PPX: Lovenox Code: FULL - discussed with patient on admission and wishes are consistent with FULL code  Dispo: Admit patient to Inpatient with expected length of stay greater than 2 midnights.  Signed: Nyra Market, MD 09/21/2018, 10:05 PM  [phone]

## 2018-09-22 ENCOUNTER — Other Ambulatory Visit: Payer: Self-pay

## 2018-09-22 ENCOUNTER — Inpatient Hospital Stay (HOSPITAL_COMMUNITY): Payer: Medicare (Managed Care)

## 2018-09-22 DIAGNOSIS — E119 Type 2 diabetes mellitus without complications: Secondary | ICD-10-CM

## 2018-09-22 DIAGNOSIS — K565 Intestinal adhesions [bands], unspecified as to partial versus complete obstruction: Principal | ICD-10-CM

## 2018-09-22 DIAGNOSIS — Z90710 Acquired absence of both cervix and uterus: Secondary | ICD-10-CM

## 2018-09-22 DIAGNOSIS — Z880 Allergy status to penicillin: Secondary | ICD-10-CM

## 2018-09-22 DIAGNOSIS — Z9049 Acquired absence of other specified parts of digestive tract: Secondary | ICD-10-CM

## 2018-09-22 DIAGNOSIS — K219 Gastro-esophageal reflux disease without esophagitis: Secondary | ICD-10-CM

## 2018-09-22 DIAGNOSIS — Z8719 Personal history of other diseases of the digestive system: Secondary | ICD-10-CM

## 2018-09-22 DIAGNOSIS — Z79899 Other long term (current) drug therapy: Secondary | ICD-10-CM

## 2018-09-22 DIAGNOSIS — F329 Major depressive disorder, single episode, unspecified: Secondary | ICD-10-CM

## 2018-09-22 DIAGNOSIS — F419 Anxiety disorder, unspecified: Secondary | ICD-10-CM

## 2018-09-22 DIAGNOSIS — Z882 Allergy status to sulfonamides status: Secondary | ICD-10-CM

## 2018-09-22 LAB — LACTIC ACID, PLASMA
LACTIC ACID, VENOUS: 2.9 mmol/L — AB (ref 0.5–1.9)
Lactic Acid, Venous: 2.8 mmol/L (ref 0.5–1.9)
Lactic Acid, Venous: 2.4 mmol/L (ref 0.5–1.9)
Lactic Acid, Venous: 2 mmol/L (ref 0.5–1.9)

## 2018-09-22 LAB — CBC
HCT: 41.9 % (ref 36.0–46.0)
Hemoglobin: 12.5 g/dL (ref 12.0–15.0)
MCH: 27.9 pg (ref 26.0–34.0)
MCHC: 29.8 g/dL — AB (ref 30.0–36.0)
MCV: 93.5 fL (ref 80.0–100.0)
Platelets: 196 10*3/uL (ref 150–400)
RBC: 4.48 MIL/uL (ref 3.87–5.11)
RDW: 13.5 % (ref 11.5–15.5)
WBC: 12.5 10*3/uL — AB (ref 4.0–10.5)
nRBC: 0 % (ref 0.0–0.2)

## 2018-09-22 LAB — BASIC METABOLIC PANEL
Anion gap: 10 (ref 5–15)
BUN: 7 mg/dL — ABNORMAL LOW (ref 8–23)
CHLORIDE: 99 mmol/L (ref 98–111)
CO2: 28 mmol/L (ref 22–32)
Calcium: 9.2 mg/dL (ref 8.9–10.3)
Creatinine, Ser: 0.84 mg/dL (ref 0.44–1.00)
GFR calc non Af Amer: >60 mL/min (ref >60)
Glucose, Bld: 157 mg/dL — ABNORMAL HIGH (ref 70–99)
POTASSIUM: 4.3 mmol/L (ref 3.5–5.1)
SODIUM: 137 mmol/L (ref 135–145)

## 2018-09-22 LAB — MAGNESIUM: MAGNESIUM: 1.8 mg/dL (ref 1.7–2.4)

## 2018-09-22 LAB — GLUCOSE, CAPILLARY
GLUCOSE-CAPILLARY: 147 mg/dL — AB (ref 70–99)
GLUCOSE-CAPILLARY: 159 mg/dL — AB (ref 70–99)
Glucose-Capillary: 160 mg/dL — ABNORMAL HIGH (ref 70–99)
Glucose-Capillary: 139 mg/dL — ABNORMAL HIGH (ref 70–99)
Glucose-Capillary: 128 mg/dL — ABNORMAL HIGH (ref 70–99)
Glucose-Capillary: 132 mg/dL — ABNORMAL HIGH (ref 70–99)

## 2018-09-22 MED ORDER — DIATRIZOATE MEGLUMINE & SODIUM 66-10 % PO SOLN
90.0000 mL | Freq: Once | ORAL | Status: AC
  Administered 2018-09-22: 90 mL via NASOGASTRIC
  Filled 2018-09-22: qty 90

## 2018-09-22 MED ORDER — LACTATED RINGERS IV SOLN
INTRAVENOUS | Status: DC
  Administered 2018-09-22 – 2018-09-23 (×2): via INTRAVENOUS

## 2018-09-22 MED ORDER — LACTATED RINGERS IV BOLUS
500.0000 mL | Freq: Once | INTRAVENOUS | Status: AC
  Administered 2018-09-22: 500 mL via INTRAVENOUS

## 2018-09-22 NOTE — Progress Notes (Signed)
   Subjective: [name] [name] is doing well this AM. Her abdominal pain is minimal and she is passing flatus and had a BM yesterday. She feels as though she is continuing to belch a lot. She was able to talk with surgery this AM. Plans is for conservative therapy with hopes of resolution without further surgeries. All questions and concerns addressed.    Objective: Vital signs in last 24 hours: Vitals:   09/21/18 2130 09/21/18 2321 09/22/18 0417 09/22/18 0738  BP: (!) 176/69 (!) 154/81 (!) 172/75 (!) 157/68  Pulse: 75 75 71 68  Resp: 16 20 20 18   Temp:  97.7 F (36.5 C) 97.9 F (36.6 C) 98.2 F (36.8 C)  TempSrc:  Oral Oral Oral  SpO2: 94% 92% 95% 94%  Weight:  85.8 kg    Height:  5' (1.524 m)     General: Obese female in no acute distress Pulm: Good air movement with no wheezing or crackles  CV: RRR, no murmurs, no rubs  Abdomen: Hyperactive bowel sounds, minimal tenderness to palpation, non-distended  Extremities: No LE edema   Assessment/Plan:  [name] [name] is a 77 year old female with type II diabetes mellitus and a past surgical history of multiple abdominal surgeries including colectomy and hysterectomy who presented to the emergency department on 10/11 with abdominal pain and nausea/vomiting. CT abdomen subsequently illustrated a high-grade versus partial SBO with questionable transition point. Surgery was consulted and the patient is currently being medically managed.  SBO 2/2 intraabdominal adhesions - Symptoms have significantly improved and the patient is beginning to pass flatus - NG tube place this morning - Continue Zofran PRN for nausea and morphine for pain management - Ambulate patient when off intermittent suction - Continuing IV fluids - Currently NPO - Small bowel follow through  - Recheck lactic acid to ensure appropriate clearance - Appreciate surgical consult and recommendations  Type II diabetes mellitus - CBGs currently at goal of <180  - Q4 hours CBG checks  with sliding scale insulin while NPO  MDD / Anxiety  - Restart Duloxetine and Lamotrigine when tolerating PO intake   Dispo: Anticipated discharge in approximately 2-3 day(s) pending clinical improvement in SBO.   Levora Dredge, MD 09/22/2018, 10:39 AM

## 2018-09-22 NOTE — Consult Note (Signed)
[facility] Surgery Consult Note  [name] [name] August 05, 1941  [id number].    Requesting MD: Joni Reining Chief Complaint/Reason for Consult: SBO  HPI:  [name] [name] is a 77yo female with prior history of multiple abdominal surgeries including exploratory laparotomy with colostomy and subsequent reversal for what she says was a blocked colon and a hysterectomy, who was admitted to [facility] yesterday with a SBO. Patient reports acute onset upper abdominal pain yesterday afternoon after eating a banana. Associated with nausea and abdominal bloating, no emesis. Pain was constant and gradually worsening therefore she went to the ED. ED workup included at CTA chest/abd/pelvis which revealed a high-grade early or partial small bowel obstruction similar to prior with probable transition zone in the anterior left hemiabdomen, dilatation and fecalized material noted within obstructed small bowel to 3.7 cm, and no free air. WBC 12.5, lactic acid 2.8  This morning she states that she is feeling a little better but still has some upper/left sided abdominal pain and fullness. Last BM was yesterday. Passing some flatus this morning. States that she has had 1 SBO in the past but it was several years ago.  PMH significant for DM (not on medication), GERD, HLD, HTN, MDD/anxiety Anticoagulants: none Nonsmoker Lives at home alone Ambulates with a walker  ROS: Review of Systems  Constitutional: Negative.   HENT: Negative.   Eyes: Negative.   Respiratory: Negative.   Cardiovascular: Negative.   Gastrointestinal: Positive for abdominal pain and nausea. Negative for constipation, diarrhea and vomiting.  Genitourinary: Negative.   Musculoskeletal: Negative.   Skin: Negative.   Neurological: Negative.    All systems reviewed and otherwise negative except for as above  Family History  Problem Relation Age of Onset  . Diabetes Mother   . Heart disease Brother   . Hypertension Brother   . Diabetes  Brother   . Hypertension Daughter   . Diabetes Brother   . Diabetes Brother   . Alzheimer's disease Brother   . Anxiety disorder Daughter     Past Medical History:  Diagnosis Date  . Anxiety   . Bowel obstruction ([place])   . Burning with urination 01/04/2016  . Diabetes mellitus without complication ([place])   . Genital herpes   . GERD (gastroesophageal reflux disease)   . Hematuria 01/04/2016  . Herpes 12/22/2015  . Hyperlipidemia   . Hypertension   . LLQ abdominal tenderness 01/04/2016  . Neuropathy   . Pelvic pressure in female 01/04/2016  . Recurrent UTI   . Sciatic nerve disease   . Urinary frequency 01/04/2016  . Urticaria     Past Surgical History:  Procedure Laterality Date  . ABDOMINAL HYSTERECTOMY    . bowel obstruction    . COLON SURGERY     blocked colon    Social History:  reports that she has never smoked. Her smokeless tobacco use includes snuff. She reports that she does not drink alcohol or use drugs.  Allergies:  Allergies  Allergen Reactions  . Penicillins Nausea Only    Has patient had a PCN reaction causing immediate rash, facial/tongue/throat swelling, SOB or lightheadedness with hypotension: Yes Has patient had a PCN reaction causing severe rash involving mucus membranes or skin necrosis: No Has patient had a PCN reaction that required hospitalization No Has patient had a PCN reaction occurring within the last 10 years: No If all of the above answers are "NO", then may proceed with Cephalosporin use.   . Sulfa Antibiotics Nausea And Vomiting  Medications Prior to Admission  Medication Sig Dispense Refill  . antiseptic oral rinse (BIOTENE) LIQD 15 mLs by Mouth Rinse route as needed for dry mouth.    Marland Kitchen aspirin EC 81 MG tablet Take 81 mg by mouth daily.    . DULoxetine (CYMBALTA) 30 MG capsule Take 30 mg by mouth daily.    . fexofenadine (ALLEGRA) 180 MG tablet Take 180 mg by mouth daily.    Marland Kitchen guaiFENesin (ROBITUSSIN) 100 MG/5ML liquid Take 5 mLs  by mouth 3 (three) times daily as needed for cough.    . lamoTRIgine (LAMICTAL) 25 MG tablet Take 25 mg by mouth daily.    . Menthol, Topical Analgesic, (BIOFREEZE ROLL-ON COLORLESS) 4 % GEL Apply topically.    . mometasone (NASONEX) 50 MCG/ACT nasal spray Place 2 sprays into the nose daily.    . montelukast (SINGULAIR) 10 MG tablet Take 1 tablet (10 mg total) by mouth at bedtime. 30 tablet 5  . nystatin (NYSTATIN) powder Apply topically daily as needed (skin irritation).     . pantoprazole (PROTONIX) 20 MG tablet Take 1 tablet (20 mg total) by mouth daily. 30 tablet 5  . polyethylene glycol (MIRALAX / GLYCOLAX) packet Take 17 g by mouth daily.    . simvastatin (ZOCOR) 10 MG tablet Take 10 mg by mouth daily.     . Sodium Fluoride (PREVIDENT 5000 BOOSTER) 1.1 % PSTE Place 1 application onto teeth daily.    . valACYclovir (VALTREX) 1000 MG tablet Take 1,000 mg by mouth daily.     . ranitidine (ZANTAC) 300 MG tablet Take 1 tablet (300 mg total) by mouth at bedtime. (Patient not taking: Reported on 09/21/2018) 30 tablet 5    Prior to Admission medications   Medication Sig Start Date End Date Taking? Authorizing Provider  antiseptic oral rinse (BIOTENE) LIQD 15 mLs by Mouth Rinse route as needed for dry mouth.   Yes [provider]  aspirin EC 81 MG tablet Take 81 mg by mouth daily.   Yes [provider]  DULoxetine (CYMBALTA) 30 MG capsule Take 30 mg by mouth daily.   Yes [provider]  fexofenadine (ALLEGRA) 180 MG tablet Take 180 mg by mouth daily.   Yes [provider]  guaiFENesin (ROBITUSSIN) 100 MG/5ML liquid Take 5 mLs by mouth 3 (three) times daily as needed for cough.   Yes [provider]  lamoTRIgine (LAMICTAL) 25 MG tablet Take 25 mg by mouth daily.   Yes [provider]  Menthol, Topical Analgesic, (BIOFREEZE ROLL-ON COLORLESS) 4 % GEL Apply topically.   Yes [provider]  mometasone (NASONEX) 50 MCG/ACT nasal spray  Place 2 sprays into the nose daily.   Yes [provider]  montelukast (SINGULAIR) 10 MG tablet Take 1 tablet (10 mg total) by mouth at bedtime. 08/22/18  Yes Kozlow, Donnamarie Poag, MD  nystatin (NYSTATIN) powder Apply topically daily as needed (skin irritation).    Yes [provider]  pantoprazole (PROTONIX) 20 MG tablet Take 1 tablet (20 mg total) by mouth daily. 08/22/18  Yes Kozlow, Donnamarie Poag, MD  polyethylene glycol (MIRALAX / GLYCOLAX) packet Take 17 g by mouth daily.   Yes [provider]  simvastatin (ZOCOR) 10 MG tablet Take 10 mg by mouth daily.    Yes [provider]  Sodium Fluoride (PREVIDENT 5000 BOOSTER) 1.1 % PSTE Place 1 application onto teeth daily.   Yes [provider]  valACYclovir (VALTREX) 1000 MG tablet Take 1,000 mg by mouth  daily.  05/05/16  Yes [provider]  ranitidine (ZANTAC) 300 MG tablet Take 1 tablet (300 mg total) by mouth at bedtime. Patient not taking: Reported on 09/21/2018 08/22/18   Jiles Prows, MD  gabapentin (NEURONTIN) 300 MG capsule Take 300 mg by mouth 2 (two) times daily. Reported on 02/03/2016  04/18/16  [provider]    Blood pressure (!) 157/68, pulse 68, temperature 98.2 F (36.8 C), temperature source Oral, resp. rate 18, height 5' (1.524 m), weight 85.8 kg, SpO2 94 %. Physical Exam: General: pleasant, WD/WN white female who is laying in bed in NAD HEENT: head is normocephalic, atraumatic.  Sclera are noninjected.  Pupils equal and round.  Ears and nose without any masses or lesions.  Mouth is pink and moist. Dentition poor Heart: regular, rate, and rhythm.  No obvious murmurs, gallops, or rubs noted.  Palpable pedal pulses bilaterally Lungs: CTAB, no wheezes, rhonchi, or rales noted.  Respiratory effort nonlabored Abd: well healed midline incision/vertical incision to the R of midline/RLQ incision from prior ostomy, soft, mild distension, mild diffuse TTP most significant in the epigastric and  LUQ, +BS, no masses, hernias, or organomegaly MS: all 4 extremities are symmetrical with no cyanosis, clubbing, or edema. Skin: warm and dry with no masses, lesions, or rashes Psych: A&Ox3 with an appropriate affect. Neuro: cranial nerves grossly intact, extremity CSM intact bilaterally, normal speech  Results for orders placed or performed during the hospital encounter of 09/21/18 (from the past 48 hour(s))  Comprehensive metabolic panel     Status: Abnormal   Collection Time: 09/21/18  4:15 PM  Result Value Ref Range   Sodium 136 135 - 145 mmol/L   Potassium 3.9 3.5 - 5.1 mmol/L   Chloride 103 98 - 111 mmol/L   CO2 25 22 - 32 mmol/L   Glucose, Bld 166 (H) 70 - 99 mg/dL   BUN 11 8 - 23 mg/dL   Creatinine, Ser 0.84 0.44 - 1.00 mg/dL   Calcium 9.2 8.9 - 10.3 mg/dL   Total Protein 6.9 6.5 - 8.1 g/dL   Albumin 4.1 3.5 - 5.0 g/dL   AST 18 15 - 41 U/L   ALT 11 0 - 44 U/L   Alkaline Phosphatase 74 38 - 126 U/L   Total Bilirubin 0.6 0.3 - 1.2 mg/dL   GFR calc non Af Amer >60 >60 mL/min   GFR calc Af Amer >60 >60 mL/min    Comment: (NOTE) The eGFR has been calculated using the CKD EPI equation. This calculation has not been validated in all clinical situations. eGFR's persistently <60 mL/min signify possible Chronic Kidney Disease.    Anion gap 8 5 - 15    Comment: Performed at [facility] [address], [place], [place] [address]  Lipase, blood     Status: Abnormal   Collection Time: 09/21/18  4:15 PM  Result Value Ref Range   Lipase 58 (H) 11 - 51 U/L    Comment: Performed at [facility] [address], [place], [place] [address]  CBC with Diff     Status: Abnormal   Collection Time: 09/21/18  4:15 PM  Result Value Ref Range   WBC 12.2 (H) 4.0 - 10.5 K/uL   RBC 4.76 3.87 - 5.11 MIL/uL   Hemoglobin 13.4 12.0 - 15.0 g/dL   HCT 44.7 36.0 - 46.0 %   MCV 93.9 80.0 - 100.0 fL   MCH 28.2 26.0 - 34.0 pg  MCHC 30.0 30.0 - 36.0 g/dL   RDW 13.3 11.5 - 15.5 %    Platelets 222 150 - 400 K/uL   Neutrophils Relative % 71 %   Neutro Abs 8.6 (H) 1.7 - 7.7 K/uL   Lymphocytes Relative 18 %   Lymphs Abs 2.2 0.7 - 4.0 K/uL   Monocytes Relative 8 %   Monocytes Absolute 0.9 0.1 - 1.0 K/uL   Eosinophils Relative 3 %   Eosinophils Absolute 0.3 0.0 - 0.5 K/uL   Basophils Relative 0 %   Basophils Absolute 0.0 0.0 - 0.1 K/uL    Comment: Performed at [facility] [address], [place], [place] [address]  I-stat troponin, ED     Status: None   Collection Time: 09/21/18  4:24 PM  Result Value Ref Range   Troponin i, poc 0.00 0.00 - 0.08 ng/mL   Comment 3            Comment: Due to the release kinetics of cTnI, a negative result within the first hours of the onset of symptoms does not rule out myocardial infarction with certainty. If myocardial infarction is still suspected, repeat the test at appropriate intervals.   I-Stat CG4 Lactic Acid, ED     Status: Abnormal   Collection Time: 09/21/18  4:26 PM  Result Value Ref Range   Lactic Acid, Venous 2.88 (HH) 0.5 - 1.9 mmol/L   Comment NOTIFIED PHYSICIAN   Urinalysis, Complete w Microscopic     Status: Abnormal   Collection Time: 09/21/18  6:05 PM  Result Value Ref Range   Color, Urine YELLOW YELLOW   APPearance CLEAR CLEAR   Specific Gravity, Urine 1.011 1.005 - 1.030   pH 5.0 5.0 - 8.0   Glucose, UA 50 (A) NEGATIVE mg/dL   Hgb urine dipstick SMALL (A) NEGATIVE   Bilirubin Urine NEGATIVE NEGATIVE   Ketones, ur NEGATIVE NEGATIVE mg/dL   Protein, ur NEGATIVE NEGATIVE mg/dL   Nitrite POSITIVE (A) NEGATIVE   Leukocytes, UA NEGATIVE NEGATIVE   RBC / HPF 0-5 0 - 5 RBC/hpf   WBC, UA 0-5 0 - 5 WBC/hpf   Bacteria, UA RARE (A) NONE SEEN    Comment: Performed at [facility], [address], [place], [place] [address]  CBG monitoring, ED     Status: Abnormal   Collection Time: 09/21/18 10:35 PM  Result Value Ref Range   Glucose-Capillary 200 (H) 70 - 99 mg/dL  Lactic acid, plasma     Status:  Abnormal   Collection Time: 09/21/18 11:28 PM  Result Value Ref Range   Lactic Acid, Venous 2.9 (HH) 0.5 - 1.9 mmol/L    Comment: CRITICAL RESULT CALLED TO, READ BACK BY AND VERIFIED WITH: Dennison Bulla K,RN 09/22/18 0022 WAYK Performed at [facility], [place] [address], [place], [place] [address]   Glucose, capillary     Status: Abnormal   Collection Time: 09/22/18 12:44 AM  Result Value Ref Range   Glucose-Capillary 159 (H) 70 - 99 mg/dL  Basic metabolic panel     Status: Abnormal   Collection Time: 09/22/18  2:56 AM  Result Value Ref Range   Sodium 137 135 - 145 mmol/L   Potassium 4.3 3.5 - 5.1 mmol/L   Chloride 99 98 - 111 mmol/L   CO2 28 22 - 32 mmol/L   Glucose, Bld 157 (H) 70 - 99 mg/dL   BUN 7 (L) 8 - 23 mg/dL   Creatinine, Ser 0.84 0.44 -  1.00 mg/dL   Calcium 9.2 8.9 - 10.3 mg/dL   GFR calc non Af Amer >60 >60 mL/min   GFR calc Af Amer >60 >60 mL/min    Comment: (NOTE) The eGFR has been calculated using the CKD EPI equation. This calculation has not been validated in all clinical situations. eGFR's persistently <60 mL/min signify possible Chronic Kidney Disease.    Anion gap 10 5 - 15    Comment: Performed at [facility] [address] [place], [place] [address]  Magnesium     Status: None   Collection Time: 09/22/18  2:56 AM  Result Value Ref Range   Magnesium 1.8 1.7 - 2.4 mg/dL    Comment: Performed at [facility] [address], [place], [place] [address]  CBC     Status: Abnormal   Collection Time: 09/22/18  2:56 AM  Result Value Ref Range   WBC 12.5 (H) 4.0 - 10.5 K/uL   RBC 4.48 3.87 - 5.11 MIL/uL   Hemoglobin 12.5 12.0 - 15.0 g/dL   HCT 41.9 36.0 - 46.0 %   MCV 93.5 80.0 - 100.0 fL   MCH 27.9 26.0 - 34.0 pg   MCHC 29.8 (L) 30.0 - 36.0 g/dL   RDW 13.5 11.5 - 15.5 %   Platelets 196 150 - 400 K/uL   nRBC 0.0 0.0 - 0.2 %    Comment: Performed at [facility], [place] [address], [place], [place] [address]  Lactic acid, plasma      Status: Abnormal   Collection Time: 09/22/18  2:56 AM  Result Value Ref Range   Lactic Acid, Venous 2.8 (HH) 0.5 - 1.9 mmol/L    Comment: CRITICAL RESULT CALLED TO, READ BACK BY AND VERIFIED WITH: MURPHY N,RN 09/22/18 0350 WAYK Performed at [facility], [place] [address] [place], [place] [address]   Glucose, capillary     Status: Abnormal   Collection Time: 09/22/18  4:13 AM  Result Value Ref Range   Glucose-Capillary 147 (H) 70 - 99 mg/dL   Dg Chest Portable 1 View  Result Date: 09/21/2018 CLINICAL DATA:  Upper epigastric abdominal pain. EXAM: PORTABLE CHEST 1 VIEW COMPARISON:  02/03/2016 FINDINGS: Borderline cardiomegaly. Mild uncoiling of the thoracic aorta without aneurysm. Clear lungs. No acute osseous abnormality. IMPRESSION: No active disease.  No significant change. Electronically Signed   By: Ashley Royalty M.D.   On: 09/21/2018 16:45   Ct Angio Chest/abd/pel For Dissection W And/or Wo Contrast  Result Date: 09/21/2018 CLINICAL DATA:  Upper epigastric and abdominal pain of sudden onset. History of bowel obstruction in the past. EXAM: CT ANGIOGRAPHY CHEST, ABDOMEN AND PELVIS TECHNIQUE: Multidetector CT imaging through the chest, abdomen and pelvis was performed using the standard protocol during bolus administration of intravenous contrast. Multiplanar reconstructed images and MIPs were obtained and reviewed to evaluate the vascular anatomy. CONTRAST:  164m ISOVUE-370 IOPAMIDOL (ISOVUE-370) INJECTION 76% COMPARISON:  CT AP 04/27/2016, CXR 09/21/2018 and 09/18/2018 FINDINGS: CTA CHEST FINDINGS Cardiovascular: Noncontrast imaging through the chest demonstrates no mural hematoma of the thoracic aorta. Mild-to-moderate atherosclerosis of the thoracic aorta at the arch. Left main and LAD coronary arteriosclerosis is noted. Heart size is normal without pericardial effusion. Atherosclerosis of the great vessels. Upon contrast administration, no significant luminal narrowing of the great  vessels. No aortic dissection. Mild dilatation of the main pulmonary artery to 3.3 cm likely representing a component of chronic pulmonary hypertension. No acute pulmonary embolus. The thoracic aorta measures  to 3.4 cm along the ascending portion. No aneurysm. Mediastinum/Nodes: Mildly enlarged right lower paratracheal lymph node to 13 mm short axis likely reactive. No hilar lymphadenopathy. Patent trachea and mainstem bronchi. Th trace fluid in the distal esophagus possibly from delayed esophageal emptying or reflux. Small hiatal hernia is present. Lungs/Pleura: No pulmonary consolidation, effusion or pneumothorax. Trace extrapleural fat along the posterior aspect of both lungs. Musculoskeletal: Multilevel degenerative disc disease and endplate spurring along the thoracic spine without acute fracture or suspicious osseous lesions. Discogenic sclerosis is at T1-T2 and to a lesser degree from T6 through T9. Review of the MIP images confirms the above findings. CTA ABDOMEN AND PELVIS FINDINGS VASCULAR Aorta: Nonaneurysmal minimally atherosclerotic aorta without dissection. Celiac: Normal branch pattern. No significant stenosis, dissection or aneurysm. SMA: Patent along its visualized course without thrombosis, dissection or aneurysm. Renals: Ectopic takeoff of the right single renal artery along the ventral aspect of the aorta just lateral to the take-off of the SMA. No significant stenosis or aneurysm. Conventional takeoff of the left single renal artery with atherosclerotic origin but without significant stenosis. IMA: Patent along its visualized course. Inflow: Atherosclerotic common iliac arteries without significant stenosis, aneurysm or dissection. Veins: No obvious venous abnormality within the limitations of this arterial phase study. Review of the MIP images confirms the above findings. NON-VASCULAR Hepatobiliary: Nonvisualized gallbladder presumed surgically absent. Slight surface nodularity of the liver  suspicious for morphologic changes of cirrhosis. No space-occupying mass or biliary dilatation. Pancreas: Normal Spleen: Normal Adrenals/Urinary Tract: Normal bilateral adrenal glands. Mild renal cortical thinning. Bilateral extrarenal pelves are noted right greater than left. No obstructive uropathy. The urinary bladder is unremarkable for the degree of distention. Stomach/Bowel: Dilated small bowel loops starting from the second portion of the duodenum and involving much of the jejunum with probable transition point in the left lower quadrant similar to prior and likely related to adhesions. Fecalized material is seen within jejunal loops measuring up to 3.7 cm in diameter. Stomach is decompressed in appearance and contains an average amount of fecal residue. Distal and terminal ileum are decompressed. Nonvisualized appendix. Lymphatic: No lymphadenopathy. Reproductive: Status post hysterectomy. No adnexal masses. Other: No free air nor free fluid. Musculoskeletal: No acute osseous abnormality. Degenerative disc disease with disc height loss from L2 through S1 with 2 mm of retrolisthesis of L2 on L3 and L3 on L4. Discogenic sclerosis across the L3-4 interspace. Review of the MIP images confirms the above findings. IMPRESSION: Vascular: 1. No aortic aneurysm or dissection.  No acute pulmonary embolus. 2. 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 3. Patent branch vessels off the abdominal aorta without significant stenosis. Nonvascular: 1. No active pulmonary disease. 2. High-grade early or partial small bowel obstruction similar to prior with probable transition zone in the anterior left hemiabdomen. Dilatation and fecalized material noted within obstructed small bowel to 3.7 cm. No free air. 3. Thoracolumbar spondylosis. 4. Nonvisualized gallbladder and appendix presumed surgically absent. Status post hysterectomy. Electronically Signed   By: Ashley Royalty M.D.   On: 09/21/2018 18:54       Assessment/Plan DM (not on medication) GERD HLD HTN MDD/anxiety  SBO likely 2/2 adhesions - Patient with prior history of multiple abdominal surgeries and CT scan consistent with SBO and transition point in the left hemiabdomen. Will order NG tube and start patient on small bowel protocol.  ID - none VTE - SCDs, per primary FEN - IVF, NPO/NGT Foley - none   Wellington Hampshire, PA-C [facility] 09/22/2018, 8:02 AM Pager: [phone] Mon 7:00 am -11:30 AM Tues-Fri 7:00 am-4:30 pm Sat-Sun 7:00 am-11:30 am

## 2018-09-23 DIAGNOSIS — Z978 Presence of other specified devices: Secondary | ICD-10-CM

## 2018-09-23 LAB — BASIC METABOLIC PANEL
Anion gap: 12 (ref 5–15)
BUN: 8 mg/dL (ref 8–23)
CHLORIDE: 93 mmol/L — AB (ref 98–111)
CO2: 30 mmol/L (ref 22–32)
Calcium: 8.8 mg/dL — ABNORMAL LOW (ref 8.9–10.3)
Creatinine, Ser: 0.96 mg/dL (ref 0.44–1.00)
GFR calc Af Amer: >60 mL/min (ref >60)
GFR calc non Af Amer: 56 mL/min — ABNORMAL LOW (ref >60)
GLUCOSE: 138 mg/dL — AB (ref 70–99)
POTASSIUM: 3.7 mmol/L (ref 3.5–5.1)
Sodium: 135 mmol/L (ref 135–145)

## 2018-09-23 LAB — GLUCOSE, CAPILLARY
GLUCOSE-CAPILLARY: 110 mg/dL — AB (ref 70–99)
GLUCOSE-CAPILLARY: 143 mg/dL — AB (ref 70–99)
GLUCOSE-CAPILLARY: 147 mg/dL — AB (ref 70–99)
Glucose-Capillary: 141 mg/dL — ABNORMAL HIGH (ref 70–99)
Glucose-Capillary: 133 mg/dL — ABNORMAL HIGH (ref 70–99)
Glucose-Capillary: 112 mg/dL — ABNORMAL HIGH (ref 70–99)

## 2018-09-23 LAB — CBC
HEMATOCRIT: 40.2 % (ref 36.0–46.0)
HEMOGLOBIN: 12 g/dL (ref 12.0–15.0)
MCH: 27.9 pg (ref 26.0–34.0)
MCHC: 29.9 g/dL — ABNORMAL LOW (ref 30.0–36.0)
MCV: 93.5 fL (ref 80.0–100.0)
PLATELETS: 176 10*3/uL (ref 150–400)
RBC: 4.3 MIL/uL (ref 3.87–5.11)
RDW: 13.6 % (ref 11.5–15.5)
WBC: 9.7 10*3/uL (ref 4.0–10.5)
nRBC: 0 % (ref 0.0–0.2)

## 2018-09-23 MED ORDER — RAMELTEON 8 MG PO TABS
8.0000 mg | ORAL_TABLET | Freq: Once | ORAL | Status: AC
  Administered 2018-09-23: 8 mg via ORAL
  Filled 2018-09-23: qty 1

## 2018-09-23 NOTE — Progress Notes (Signed)
Patient tolerated her clear diet at lunch time.She is asymptomatic until time.Also she moved her bowel twice since then,the first one was a big one and the second was a loose stool.Will advance to full liquid.

## 2018-09-23 NOTE — Progress Notes (Signed)
Internal Medicine Attending:   I saw and examined the patient. I reviewed the Dr Moshe Salisbury note and I agree with the resident's findings and plan as documented in the resident's note with the following additions On my evaluation NGT is now out, otherwise agree with exam.  Patient is feeling better, +flatus, no BM.  Continue clear liquids today.

## 2018-09-23 NOTE — Progress Notes (Signed)
   Subjective: Patient reports that she is doing well today, no acute events overnight.  She had just received morphine for abdominal pain and reports that her pain has improved.  She states that she has not been having a lot of nausea and denies any vomiting.  She endorses flatus but has not had a bowel movement yet.  She reports that she slept well last night.  She has been up to the bathroom with assistance.  She reports that she wants to go home however we discussed that we need to see improvement in her small bowel obstruction symptoms before that time.  She is in agreement with the plan Objective:  Vital signs in last 24 hours: Vitals:   09/22/18 1609 09/22/18 2022 09/23/18 0408 09/23/18 0841  BP: (!) 158/65 (!) 149/70 (!) 151/65 (!) 165/70  Pulse: 72 76 84 72  Resp: 18 18 19 18   Temp: 97.6 F (36.4 C) 98.2 F (36.8 C) 98 F (36.7 C) 99.4 F (37.4 C)  TempSrc: Oral Oral Oral Oral  SpO2: 90% 93% (!) 85% (!) 86%  Weight:  85.8 kg    Height:        General: Well nourished, well appearing, NAD  HEENT: NG tube in place, intermittent suctioning Cardiac: RRR, normal S1, S2, no murmurs, rubs or gallops  Pulmonary: Lungs CTA bilaterally, no wheezing, rhonchi or rales  Abdomen: Soft, non-tender, +bowel sounds, no guarding or masses noted  Psychiatry: Normal mood and affect    Assessment/Plan:  Principal Problem:   SBO (small bowel obstruction) (HCC) Active Problems:   Non-insulin dependent type 2 diabetes mellitus (HCC) Ms. Merriott is a 77 year old female with type 2 diabetes of multiple abdominal surgeries including colectomy hysterectomy who presented on 10/11 with abdominal pain and nausea and vomiting.  CT abdomen showed a high-grade versus partial SBO with questionable transition point.  Surgery is consulted and patient is currently being currently being medically managed.  Small bowel obstruction secondary to intra-abdominal adhesions - Symptoms appear to be improving in she is  continuing to have flatus, no bowel movements yet -NG tube in place, can consider clamping today - Ambulate when off intermittent suction Continue LR 100 mL/h - Continue n.p.o. for now - Small bowel follow-through -Appreciate surgical recommendations  Type 2 diabetes mellitus -CBGs every 4 hours - Sliding scale insulin while n.p.o.  MDD/anxiety - Takes duloxetine and lamotrigine at home.  She is currently n.p.o. but will continue in she is tolerating diet  FEN: LR 100 cc/hr, replete lytes prn, NPO VTE ppx: Lovenox  Code Status: FULL  Dispo: Anticipated discharge is pending improvement of small bowel obstruction and if surgical intervention is needed.   Claudean Severance, MD 09/23/2018, 9:02 AM Pager: [phone]

## 2018-09-23 NOTE — Progress Notes (Signed)
[facility] Progress Note     Subjective: CC-  Patient states that her abdomen is a little sore, but overall pain improving. NG tube causing more pain. Denies n/v. Passing flatus, no BM.  Follow up xray showed contrast in colon and resolution of SBO.  Objective: Vital signs in last 24 hours: Temp:  [97.6 F (36.4 C)-99.4 F (37.4 C)] 99.4 F (37.4 C) (10/13 0841) Pulse Rate:  [72-84] 72 (10/13 0841) Resp:  [18-19] 18 (10/13 0841) BP: (149-165)/(65-70) 165/70 (10/13 0841) SpO2:  [85 %-93 %] 86 % (10/13 0841) Weight:  [85.8 kg] 85.8 kg (10/12 2022) Last BM Date: 09/22/18  Intake/Output from previous day: 10/12 0701 - 10/13 0700 In: 1833.5 [I.V.:1833.5] Out: 1550 [Urine:900; Emesis/NG output:650] Intake/Output this shift: Total I/O In: -  Out: 600 [Emesis/NG output:600]  PE: Gen:  Alert, NAD, pleasant HEENT: EOM's intact, pupils equal and round Card:  RRR Pulm:  CTAB, no W/R/R, effort normal Abd: Soft, ND, +BS in all 4 quadrants, mild left sided abdominal TTP without rebound or guarding, no HSM, no hernia Ext: calves soft and nontender Psych: A&Ox3  Skin: no rashes noted, warm and dry  Lab Results:  Recent Labs    09/22/18 0256 09/23/18 0703  WBC 12.5* 9.7  HGB 12.5 12.0  HCT 41.9 40.2  PLT 196 176   BMET Recent Labs    09/22/18 0256 09/23/18 0703  NA 137 135  K 4.3 3.7  CL 99 93*  CO2 28 30  GLUCOSE 157* 138*  BUN 7* 8  CREATININE 0.84 0.96  CALCIUM 9.2 8.8*   PT/INR No results for input(s): LABPROT, INR in the last 72 hours. CMP     Component Value Date/Time   NA 135 09/23/2018 0703   K 3.7 09/23/2018 0703   CL 93 (L) 09/23/2018 0703   CO2 30 09/23/2018 0703   GLUCOSE 138 (H) 09/23/2018 0703   BUN 8 09/23/2018 0703   CREATININE 0.96 09/23/2018 0703   CALCIUM 8.8 (L) 09/23/2018 0703   PROT 6.9 09/21/2018 1615   ALBUMIN 4.1 09/21/2018 1615   AST 18 09/21/2018 1615   ALT 11 09/21/2018 1615   ALKPHOS 74 09/21/2018 1615   BILITOT 0.6 09/21/2018 1615   GFRNONAA 56 (L) 09/23/2018 0703   GFRAA >60 09/23/2018 0703   Lipase     Component Value Date/Time   LIPASE 58 (H) 09/21/2018 1615       Studies/Results: Dg Chest Portable 1 View  Result Date: 09/21/2018 CLINICAL DATA:  Upper epigastric abdominal pain. EXAM: PORTABLE CHEST 1 VIEW COMPARISON:  02/03/2016 FINDINGS: Borderline cardiomegaly. Mild uncoiling of the thoracic aorta without aneurysm. Clear lungs. No acute osseous abnormality. IMPRESSION: No active disease.  No significant change. Electronically Signed   By: Tollie Eth M.D.   On: 09/21/2018 16:45   Dg Abd Portable 1v-small Bowel Obstruction Protocol-initial, 8 Hr Delay  Result Date: 09/22/2018 CLINICAL DATA:  Small bowel obstruction. EXAM: PORTABLE ABDOMEN - 1 VIEW COMPARISON:  Prior today FINDINGS: Oral contrast material is seen throughout the colon. No evidence of dilated small bowel loops. Nasogastric tube is seen with tip overlying the gastric antrum. IMPRESSION: Oral contrast material throughout the colon. No evidence of small bowel obstruction. Electronically Signed   By: Myles Rosenthal M.D.   On: 09/22/2018 21:54   Dg Abd Portable 1v-small Bowel Protocol-position Verification  Result Date: 09/22/2018 CLINICAL DATA:  Encounter for NG tube placement. EXAM: PORTABLE ABDOMEN - 1 VIEW COMPARISON:  None. FINDINGS: Enteric tube  is adequately positioned in the stomach with tip directed towards the stomach pylorus. Visualized bowel gas pattern is nonobstructive. IMPRESSION: NG tube appears adequately positioned in the stomach with tip directed towards the stomach antrum/pylorus. Electronically Signed   By: Bary Richard M.D.   On: 09/22/2018 10:56   Ct Angio Chest/abd/pel For Dissection W And/or Wo Contrast  Result Date: 09/21/2018 CLINICAL DATA:  Upper epigastric and abdominal pain of sudden onset. History of bowel obstruction in the past. EXAM: CT ANGIOGRAPHY CHEST, ABDOMEN AND PELVIS TECHNIQUE:  Multidetector CT imaging through the chest, abdomen and pelvis was performed using the standard protocol during bolus administration of intravenous contrast. Multiplanar reconstructed images and MIPs were obtained and reviewed to evaluate the vascular anatomy. CONTRAST:  ISOVUE-370 IOPAMIDOL (ISOVUE-370) INJECTION 76% COMPARISON:  CT AP 04/27/2016, CXR 09/21/2018 and 09/18/2018 FINDINGS: CTA CHEST FINDINGS Cardiovascular: Noncontrast imaging through the chest demonstrates no mural hematoma of the thoracic aorta. Mild-to-moderate atherosclerosis of the thoracic aorta at the arch. Left main and LAD coronary arteriosclerosis is noted. Heart size is normal without pericardial effusion. Atherosclerosis of the great vessels. Upon contrast administration, no significant luminal narrowing of the great vessels. No aortic dissection. Mild dilatation of the main pulmonary artery to 3.3 cm likely representing a component of chronic pulmonary hypertension. No acute pulmonary embolus. The thoracic aorta measures to 3.4 cm along the ascending portion. No aneurysm. Mediastinum/Nodes: Mildly enlarged right lower paratracheal lymph node to 13 mm short axis likely reactive. No hilar lymphadenopathy. Patent trachea and mainstem bronchi. Th trace fluid in the distal esophagus possibly from delayed esophageal emptying or reflux. Small hiatal hernia is present. Lungs/Pleura: No pulmonary consolidation, effusion or pneumothorax. Trace extrapleural fat along the posterior aspect of both lungs. Musculoskeletal: Multilevel degenerative disc disease and endplate spurring along the thoracic spine without acute fracture or suspicious osseous lesions. Discogenic sclerosis is at T1-T2 and to a lesser degree from T6 through T9. Review of the MIP images confirms the above findings. CTA ABDOMEN AND PELVIS FINDINGS VASCULAR Aorta: Nonaneurysmal minimally atherosclerotic aorta without dissection. Celiac: Normal branch pattern. No significant  stenosis, dissection or aneurysm. SMA: Patent along its visualized course without thrombosis, dissection or aneurysm. Renals: Ectopic takeoff of the right single renal artery along the ventral aspect of the aorta just lateral to the take-off of the SMA. No significant stenosis or aneurysm. Conventional takeoff of the left single renal artery with atherosclerotic origin but without significant stenosis. IMA: Patent along its visualized course. Inflow: Atherosclerotic common iliac arteries without significant stenosis, aneurysm or dissection. Veins: No obvious venous abnormality within the limitations of this arterial phase study. Review of the MIP images confirms the above findings. NON-VASCULAR Hepatobiliary: Nonvisualized gallbladder presumed surgically absent. Slight surface nodularity of the liver suspicious for morphologic changes of cirrhosis. No space-occupying mass or biliary dilatation. Pancreas: Normal Spleen: Normal Adrenals/Urinary Tract: Normal bilateral adrenal glands. Mild renal cortical thinning. Bilateral extrarenal pelves are noted right greater than left. No obstructive uropathy. The urinary bladder is unremarkable for the degree of distention. Stomach/Bowel: Dilated small bowel loops starting from the second portion of the duodenum and involving much of the jejunum with probable transition point in the left lower quadrant similar to prior and likely related to adhesions. Fecalized material is seen within jejunal loops measuring up to 3.7 cm in diameter. Stomach is decompressed in appearance and contains an average amount of fecal residue. Distal and terminal ileum are decompressed. Nonvisualized appendix. Lymphatic: No lymphadenopathy. Reproductive: Status post hysterectomy. No adnexal masses.  Other: No free air nor free fluid. Musculoskeletal: No acute osseous abnormality. Degenerative disc disease with disc height loss from L2 through S1 with 2 mm of retrolisthesis of L2 on L3 and L3 on L4.  Discogenic sclerosis across the L3-4 interspace. Review of the MIP images confirms the above findings. IMPRESSION: Vascular: 1. No aortic aneurysm or dissection.  No acute pulmonary embolus. 2. 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 3. Patent branch vessels off the abdominal aorta without significant stenosis. Nonvascular: 1. No active pulmonary disease. 2. High-grade early or partial small bowel obstruction similar to prior with probable transition zone in the anterior left hemiabdomen. Dilatation and fecalized material noted within obstructed small bowel to 3.7 cm. No free air. 3. Thoracolumbar spondylosis. 4. Nonvisualized gallbladder and appendix presumed surgically absent. Status post hysterectomy. Electronically Signed   By: Tollie Eth M.D.   On: 09/21/2018 18:54    Anti-infectives: Anti-infectives (From admission, onward)   None       Assessment/Plan DM (not on medication) GERD HLD HTN MDD/anxiety  SBO likely 2/2 adhesions - SB protocol started on 10/12 - 8 hr delayed film shows contrast in colon and resolution of bowel obstruction. Patient is passing flatus. D/c NG tube and advance to clear liquids. Advance diet as tolerated. Encourage mobilization.  General surgery will sign off, please call with concerns  ID - none VTE - SCDs, per primary FEN - IVF, NPO/NGT Foley - none Follow up - no surgical f/u needed   LOS: 2 days    Franne Forts , [facility] 09/23/2018, 9:21 AM Pager: [phone] Mon 7:00 am -11:30 AM Tues-Fri 7:00 am-4:30 pm Sat-Sun 7:00 am-11:30 am

## 2018-09-24 LAB — BASIC METABOLIC PANEL
Anion gap: 9 (ref 5–15)
BUN: 5 mg/dL — AB (ref 8–23)
CHLORIDE: 101 mmol/L (ref 98–111)
CO2: 29 mmol/L (ref 22–32)
Calcium: 8.8 mg/dL — ABNORMAL LOW (ref 8.9–10.3)
Creatinine, Ser: 0.73 mg/dL (ref 0.44–1.00)
GFR calc Af Amer: >60 mL/min (ref >60)
GFR calc non Af Amer: >60 mL/min (ref >60)
Glucose, Bld: 145 mg/dL — ABNORMAL HIGH (ref 70–99)
POTASSIUM: 3.4 mmol/L — AB (ref 3.5–5.1)
Sodium: 139 mmol/L (ref 135–145)

## 2018-09-24 LAB — GLUCOSE, CAPILLARY
GLUCOSE-CAPILLARY: 147 mg/dL — AB (ref 70–99)
Glucose-Capillary: 128 mg/dL — ABNORMAL HIGH (ref 70–99)
Glucose-Capillary: 132 mg/dL — ABNORMAL HIGH (ref 70–99)
Glucose-Capillary: 178 mg/dL — ABNORMAL HIGH (ref 70–99)

## 2018-09-24 MED ORDER — POTASSIUM CHLORIDE CRYS ER 20 MEQ PO TBCR
30.0000 meq | EXTENDED_RELEASE_TABLET | Freq: Two times a day (BID) | ORAL | Status: AC
  Administered 2018-09-24: 30 meq via ORAL
  Filled 2018-09-24: qty 1

## 2018-09-24 MED ORDER — OXYCODONE HCL 5 MG PO TABS: 5.0000 mg | ORAL_TABLET | ORAL | Status: DC | PRN

## 2018-09-24 MED ORDER — ACETAMINOPHEN 325 MG PO TABS: 650.0000 mg | ORAL_TABLET | Freq: Four times a day (QID) | ORAL | Status: DC | PRN

## 2018-09-24 NOTE — Progress Notes (Addendum)
   Subjective: [name] [name] reports that she has been doing well, no acute events overnight.  She has been tolerating her liquid diet well with no nausea , vomiting, or abdominal bloating or pain.  She has been passing gas and has had 2 bowel movements yesterday.  She reports that she is feeling well and is ready to go home.  We discussed that we will need to diet and adjust her pain medications and see how she does.  She is in agreement with the plan.  Addendum: After lunch patient reports that she is feeling good. She was able to tolerate a regular diet with no issues. She has been having bowel movements and passing gas. Denies any nausea or vomiting. We discussed that she should be stable to return home and she was in agreement with the plan.   Objective:  Vital signs in last 24 hours: Vitals:   09/23/18 0408 09/23/18 0841 09/23/18 1632 09/23/18 2019  BP: (!) 151/65 (!) 165/70 (!) 144/88 (!) 135/55  Pulse: 84 72 68 70  Resp: 19 18 18 17   Temp: 98 F (36.7 C) 99.4 F (37.4 C) 98.1 F (36.7 C) 98.1 F (36.7 C)  TempSrc: Oral Oral Oral Oral  SpO2: (!) 85% (!) 86% 95% 94%  Weight:      Height:        General: Well nourished, well appearing, NAD  HEENT: No NGT in place Cardiac: RRR, normal S1, S2, no murmurs, rubs or gallops  Pulmonary: Lungs CTA bilaterally, no wheezing, rhonchi or rales  Abdomen: Soft, non-tender, +bowel sounds, no guarding or masses noted  Psychiatry: Normal mood and affect     Assessment/Plan:  Principal Problem:   SBO (small bowel obstruction) (HCC) Active Problems:   Non-insulin dependent type 2 diabetes mellitus (HCC)  [name] [name] is a 77 year old female with type 2 diabetes of multiple abdominal surgeries including colectomy hysterectomy who presented on 10/11 with abdominal pain and nausea and vomiting.  CT abdomen showed a high-grade versus partial SBO with questionable transition point.    She had a small bowel follow-through which was normal and the NG  tube was removed yesterday.  Surgery has signed off.  She has been tolerating her diet has been having bowel movements.  Small bowel obstruction secondary to intra-abdominal effusions: Seems to have resolved with medical management. Symptoms improving, tolerating diet well with no nausea or vomiting and has been having bowel movements - Ambulate today, -Advance diet -Discontinue fluids - Likely discharge today if able to tolerate diet -Switch morphine to OxyIR  Type 2 diabetes mellitus -CBGs every 4 hours -Sliding scale insulin  MDD/anxiety - Resume duloxetine and lamotrigine on discharge  FEN: No fluids, replete lytes prn, regular diet  VTE ppx: Lovenox  Code Status: FULL   Dispo: Anticipated discharge in approximately today.   Claudean Severance, MD 09/24/2018, 6:06 AM Pager: [phone]

## 2018-09-24 NOTE — Progress Notes (Signed)
Internal Medicine Attending:   I saw and examined the patient. I reviewed the resident's note and I agree with the resident's findings and plan as documented in the resident's note.  BM x2, tolerating diet, feels well. Abd is soft and nontender on exam.  She is stable for discharge. She will [facility] on [facility].

## 2018-09-24 NOTE — Progress Notes (Signed)
   09/23/18 2212  Complaints & Interventions  Complains of Insomnia  Provider Notification  Date Provider Notified 09/23/18  Time Provider Notified 2212  Notification Type Page  Notification Reason Requested by patient/family (Wants med for sleep)  Response See new orders  Date of Provider Response 09/23/18  Time of Provider Response 2215    Lights and television turned off.  Rozerem prescribed and given at 2323.  IV Morphine given for pain control.  Will continue to monitor patient.  Bernie Covey RN-BC, [employer]

## 2018-09-24 NOTE — Care Management Note (Signed)
Case Management Note  Patient Details  Name: JALEIGHA DEANE MRN: [id number] Date of Birth: [birth year]  Subjective/Objective:    Pt admitted with SBO. She is active with [facility]. [facility] goes to [facility] 2 times [facility] and has a caregiver 2 days a week. Pt has rollator.  No issues with medications or transportation.                Action/Plan: Pt discharging home with continuation of the [facility] services.  Daughter to provide transport home. [facility] is aware of d/c home.    Expected Discharge Date:  09/24/18               Expected Discharge Plan:  Home/Self Care([facility])  In-House Referral:     Discharge planning Services     Post Acute Care Choice:    Choice offered to:     DME Arranged:    DME Agency:     HH Arranged:    HH Agency:     Status of Service:  Completed, signed off  If discussed at [employer] of Stay Meetings, dates discussed:    Additional Comments:  Kermit Balo, RN 09/24/2018, 4:31 PM

## 2018-09-24 NOTE — Discharge Instructions (Signed)
[place] [facility] [place],   It has been a pleasure working with you and we are glad you're feeling better. You were hospitalized for small bowel obstruction that was likely due to some abdominal adhesions. We were able to manage your symptoms with just supportive care. No changes to your medications were done. Please follow up with [facility] at your regular visit on Wednesday.   If your symptoms worsen or you develop new symptoms, please seek medical help whether it is your primary care provider or emergency department.  If you have any questions about this hospitalization please call [phone].

## 2018-09-24 NOTE — Discharge Summary (Signed)
Name: [name] [name] MRN: [id number] DOB: 1941/08/12 77 y.o. PCP: Estevan Oaks, NP  Date of Admission: 09/21/2018  3:49 PM Date of Discharge:  09/24/18 Attending Physician: Gust Rung, DO  Discharge Diagnosis: 1.  Small bowel obstruction  Discharge Medications: Allergies as of 09/24/2018      Reactions   Penicillins Nausea Only   Has patient had a PCN reaction causing immediate rash, facial/tongue/throat swelling, SOB or lightheadedness with hypotension: Yes Has patient had a PCN reaction causing severe rash involving mucus membranes or skin necrosis: No Has patient had a PCN reaction that required hospitalization No Has patient had a PCN reaction occurring within the last 10 years: No If all of the above answers are "NO", then may proceed with Cephalosporin use.   Sulfa Antibiotics Nausea And Vomiting      Medication List    TAKE these medications   antiseptic oral rinse Liqd 15 mLs by Mouth Rinse route as needed for dry mouth.   aspirin EC 81 MG tablet Take 81 mg by mouth daily.   BIOFREEZE ROLL-ON COLORLESS 4 % Gel Generic drug:  Menthol (Topical Analgesic) Apply topically.   DULoxetine 30 MG capsule Commonly known as:  CYMBALTA Take 30 mg by mouth daily.   fexofenadine 180 MG tablet Commonly known as:  ALLEGRA Take 180 mg by mouth daily.   guaiFENesin 100 MG/5ML liquid Commonly known as:  ROBITUSSIN Take 5 mLs by mouth 3 (three) times daily as needed for cough.   lamoTRIgine 25 MG tablet Commonly known as:  LAMICTAL Take 25 mg by mouth daily.   mometasone 50 MCG/ACT nasal spray Commonly known as:  NASONEX Place 2 sprays into the nose daily.   montelukast 10 MG tablet Commonly known as:  SINGULAIR Take 1 tablet (10 mg total) by mouth at bedtime.   nystatin powder Generic drug:  nystatin Apply topically daily as needed (skin irritation).   pantoprazole 20 MG tablet Commonly known as:  PROTONIX Take 1 tablet (20 mg total) by mouth  daily.   polyethylene glycol packet Commonly known as:  MIRALAX / GLYCOLAX Take 17 g by mouth daily.   PREVIDENT 5000 BOOSTER 1.1 % Pste Generic drug:  Sodium Fluoride Place 1 application onto teeth daily.   ranitidine 300 MG tablet Commonly known as:  ZANTAC Take 1 tablet (300 mg total) by mouth at bedtime.   simvastatin 10 MG tablet Commonly known as:  ZOCOR Take 10 mg by mouth daily.   valACYclovir 1000 MG tablet Commonly known as:  VALTREX Take 1,000 mg by mouth daily.       Disposition and follow-up:   MariahMariah [name] was discharged from [facility] in Good condition.  At the hospital follow up visit please address:  1.  SBO: Patient was medically managed with NG tube and bowel rest.  Please assess recurrent symptoms and abdominal pain.  2.  Labs / imaging needed at time of follow-up: None  3.  Pending labs/ test needing follow-up: None  Follow-up Appointments: Follow-up Information    Estevan Oaks, NP .   Specialty:  Nurse Practitioner Contact information: [address] [place] [address] [phone]         [facility] on Wednesday 09/26/18  Hospital Course by problem list: 1.  Small bowel obstruction: This is a 77 year old female with history of type 2 diabetes mellitus, GERD, anxiety, MDD who presented with progressive abdominal pain, lack of bowel movements and flatus, abdominal distention, abdominal  pain, belching, and nausea.  History of abdominal hysterectomy and bowel resection secondary to previous small bowel obstruction.  Her vitals were stable except for some hypertension. Abdomen was distended, diffusely tender, high pitched, scant bowel sounds. Imaging study showed complete versus partial small bowel obstruction with transition point in the anterior hemiabdomen.  Patient was made n.p.o., started on Morphine 2 mg q2hr PRN, Zofran PRN, medications and started on IV fluids.  Surgery was consulted and recommended medical  management.  NG tube placed and she had good output.  Her abdominal symptoms improved.  Small bowel follow-through showed no evidence of small bowel obstruction. NGT was removed and her diet was advanced as tolerated. She started having bowel movements and her abdominal pain improved. Patient was discharged on her home medications.  Discharge Vitals:   BP (!) 203/66 (BP Location: Right Arm)   Pulse (!) 53   Temp 97.6 F (36.4 C) (Oral)   Resp 18   Ht 5' (1.524 m)   Wt 85.8 kg   SpO2 99%   BMI 36.94 kg/m   Pertinent Labs, Studies, and Procedures:  CTA chest/abd/pel w/ contrast: Vascular: 1. No aortic aneurysm or dissection. No acute pulmonary embolus. 2. 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 3. Patent branch vessels off the abdominal aorta without significant stenosis.  Nonvascular: 1. No active pulmonary disease. 2. High-grade early or partial small bowel obstruction similar to prior with probable transition zone in the anterior left hemiabdomen. Dilatation and fecalized material noted within obstructed small bowel to 3.7 cm. No free air. 3. Thoracolumbar spondylosis. 4. Nonvisualized gallbladder and appendix presumed surgically absent. Status post hysterectomy   Discharge Instructions: Discharge Instructions    Call MD for:  difficulty breathing, headache or visual disturbances   Complete by:  As directed    Call MD for:  extreme fatigue   Complete by:  As directed    Call MD for:  hives   Complete by:  As directed    Call MD for:  persistant dizziness or light-headedness   Complete by:  As directed    Call MD for:  persistant nausea and vomiting   Complete by:  As directed    Call MD for:  redness, tenderness, or signs of infection (pain, swelling, redness, odor or green/yellow discharge around incision site)   Complete by:  As directed    Call MD for:  severe uncontrolled pain   Complete by:  As directed    Call MD for:  temperature >100.4    Complete by:  As directed    Diet - low sodium heart healthy   Complete by:  As directed    Increase activity slowly   Complete by:  As directed       Signed: Claudean Severance, MD 09/24/2018, 3:10 PM   Pager: [phone]

## 2018-10-09 ENCOUNTER — Ambulatory Visit (INDEPENDENT_AMBULATORY_CARE_PROVIDER_SITE_OTHER): Payer: Medicare (Managed Care) | Admitting: Allergy and Immunology

## 2018-10-09 ENCOUNTER — Encounter: Payer: Self-pay | Admitting: Allergy and Immunology

## 2018-10-09 VITALS — BP 146/98 | HR 68 | Resp 18

## 2018-10-09 DIAGNOSIS — T783XXD Angioneurotic edema, subsequent encounter: Secondary | ICD-10-CM | POA: Diagnosis not present

## 2018-10-09 DIAGNOSIS — L501 Idiopathic urticaria: Secondary | ICD-10-CM

## 2018-10-09 DIAGNOSIS — J3089 Other allergic rhinitis: Secondary | ICD-10-CM

## 2018-10-09 DIAGNOSIS — K219 Gastro-esophageal reflux disease without esophagitis: Secondary | ICD-10-CM

## 2018-10-09 DIAGNOSIS — R430 Anosmia: Secondary | ICD-10-CM

## 2018-10-09 NOTE — Progress Notes (Signed)
Follow-up Note  Referring Provider: Estevan Oaks, NP Primary Provider: Estevan Oaks, NP Date of Office Visit: 10/09/2018  Subjective:   [name] [name] (DOB: [birth year]) is a 77 y.o. female who [facility] in re-evaluation of the following:  HPI: [name] [name] presents to this clinic in evaluation of allergic rhinitis, LPR, and a history of recurrent swelling reactions.  I last saw her in this clinic on 22 August 2018.  She believes that her nose is doing much better at this point in time.  She can breathe through her nose with more air passing through her nose and she has resolved her anosmia while utilizing her Nasacort and montelukast.  She has had less cough.  She has less drainage in her throat and less tickle in her throat.  She still has some cough at nighttime and takes a cough medication to suppress that issue.  She has done much better with her reflux and she does not have as much burning and regurgitation.  She has eliminated all caffeinated sodas and chocolate popsicle consumption.  She has had no episodes of swelling and she has not had any urticaria.  I did request that she had blood tests including a screening C4 and thyroid function tests in evaluation of her swelling but it does not appear as those studies were completed.  [name] [name] informs me that she was in the hospital for a transient small bowel obstruction since her last visit.  As well, she did obtain a flu vaccine this year.  Allergies as of 10/09/2018      Reactions   Penicillins Nausea Only   Has patient had a PCN reaction causing immediate rash, facial/tongue/throat swelling, SOB or lightheadedness with hypotension: Yes Has patient had a PCN reaction causing severe rash involving mucus membranes or skin necrosis: No Has patient had a PCN reaction that required hospitalization No Has patient had a PCN reaction occurring within the last 10 years: No If all  of the above answers are "NO", then may proceed with Cephalosporin use.   Sulfa Antibiotics Nausea And Vomiting      Medication List      antiseptic oral rinse Liqd 15 mLs by Mouth Rinse route as needed for dry mouth.   aspirin EC 81 MG tablet Take 81 mg by mouth daily.   BIOFREEZE ROLL-ON COLORLESS 4 % Gel Generic drug:  Menthol (Topical Analgesic) Apply topically.   DULoxetine 30 MG capsule Commonly known as:  CYMBALTA Take 30 mg by mouth daily.   fexofenadine 180 MG tablet Commonly known as:  ALLEGRA Take 180 mg by mouth daily.   guaiFENesin 100 MG/5ML liquid Commonly known as:  ROBITUSSIN Take 5 mLs by mouth 3 (three) times daily as needed for cough.   lamoTRIgine 25 MG tablet Commonly known as:  LAMICTAL Take 25 mg by mouth daily.   mometasone 50 MCG/ACT nasal spray Commonly known as:  NASONEX Place 2 sprays into the nose daily.   montelukast 10 MG tablet Commonly known as:  SINGULAIR Take 1 tablet (10 mg total) by mouth at bedtime.   nystatin powder Generic drug:  nystatin Apply topically daily as needed (skin irritation).   pantoprazole 20 MG tablet Commonly known as:  PROTONIX Take 1 tablet (20 mg total) by mouth daily.   polyethylene glycol packet Commonly known as:  MIRALAX / GLYCOLAX Take 17 g by mouth daily.   PREVIDENT 5000 BOOSTER 1.1 % Pste Generic drug:  Sodium Fluoride Place 1 application onto teeth daily.   ranitidine 300 MG tablet Commonly known as:  ZANTAC Take 1 tablet (300 mg total) by mouth at bedtime.   simvastatin 10 MG tablet Commonly known as:  ZOCOR Take 10 mg by mouth daily.   valACYclovir 1000 MG tablet Commonly known as:  VALTREX Take 1,000 mg by mouth daily.       Past Medical History:  Diagnosis Date  . Anxiety   . Bowel obstruction (HCC)   . Burning with urination 01/04/2016  . Diabetes mellitus without complication (HCC)   . Genital herpes   . GERD (gastroesophageal reflux disease)   . Hematuria  01/04/2016  . Herpes 12/22/2015  . Hyperlipidemia   . Hypertension   . LLQ abdominal tenderness 01/04/2016  . Neuropathy   . Pelvic pressure in female 01/04/2016  . Recurrent UTI   . Sciatic nerve disease   . Urinary frequency 01/04/2016  . Urticaria     Past Surgical History:  Procedure Laterality Date  . ABDOMINAL HYSTERECTOMY    . bowel obstruction    . COLON SURGERY     blocked colon    Review of systems negative except as noted in HPI / PMHx or noted below:  Review of Systems  Constitutional: Negative.   HENT: Negative.   Eyes: Negative.   Respiratory: Negative.   Cardiovascular: Negative.   Gastrointestinal: Negative.   Genitourinary: Negative.   Musculoskeletal: Negative.   Skin: Negative.   Neurological: Negative.   Endo/Heme/Allergies: Negative.   Psychiatric/Behavioral: Negative.      Objective:   Vitals:   10/09/18 1033  BP: (!) 146/98  Pulse: 68  Resp: 18          Physical Exam  HENT:  Head: Normocephalic.  Right Ear: Tympanic membrane, external ear and ear canal normal.  Left Ear: Tympanic membrane, external ear and ear canal normal.  Nose: Nose normal. No mucosal edema or rhinorrhea.  Mouth/Throat: Uvula is midline, oropharynx is clear and moist and mucous membranes are normal. No oropharyngeal exudate.  Eyes: Conjunctivae are normal.  Neck: Trachea normal. No tracheal tenderness present. No tracheal deviation present. No thyromegaly present.  Cardiovascular: Normal rate, regular rhythm, S1 normal, S2 normal and normal heart sounds.  No murmur heard. Pulmonary/Chest: Breath sounds normal. No stridor. No respiratory distress. She has no wheezes. She has no rales.  Musculoskeletal: She exhibits no edema.  Lymphadenopathy:       Head (right side): No tonsillar adenopathy present.       Head (left side): No tonsillar adenopathy present.    She has no cervical adenopathy.  Neurological: She is alert.  Skin: No rash noted. She is not diaphoretic.  No erythema. Nails show no clubbing.    Diagnostics: none  Assessment and Plan:   1. Perennial allergic rhinitis   2. Anosmia   3. LPRD (laryngopharyngeal reflux disease)   4. Angioedema, subsequent encounter   5. Idiopathic urticaria     1.  Continue to Treat and prevent inflammation:   A.  Nasonex 1 spray each nostril 1 time per day, every day  B.  Montelukast 10 mg tablet 1 time per day, every day  2.  Continue to Treat laryngopharyngeal reflux:   A.  Remain off all forms of caffeine and chocolate consumption  B.  Protonix 40 mg in a.m.  C.  Ranitidine 300 mg in p.m.  3.  Blood - C4, TSH, T4, TP  4. Contact clinic if recurrent  swelling  5.  Return to clinic in 6 months or earlier if problem  Overall it appears that [name] [name] is doing better on her current plan and she will remain on a nasal steroid and a leukotriene modifier and also continue to treat her reflux aggressively for the next 6 months.  She has not had any episodes of angioedema but to be complete we will obtain some screening blood tests looking for immunological hyperreactivity and complement abnormalities.  Certainly if she has recurrent episodes of swelling in the future she will require further evaluation and treatment.  I think it is quite possible that some of her swelling episodes might have been tied up with the use of medications for her diabetes or blood pressure which fortunately appears to have been under control without the use of an antidiabetic medicine and without the use of an ACE inhibitor at this point.  Laurette Schimke, MD Allergy / Immunology [facility]

## 2018-10-09 NOTE — Patient Instructions (Addendum)
  1.  Continue to Treat and prevent inflammation:   A.  Nasonex 1 spray each nostril 1 time per day, every day  B.  Montelukast 10 mg tablet 1 time per day, every day  2.  Continue to Treat laryngopharyngeal reflux:   A.  Remain off all forms of caffeine and chocolate consumption  B.  Protonix 40 mg in a.m.  C.  Ranitidine 300 mg in p.m.  3.  Blood - C4, TSH, T4, TP  4. Contact clinic if recurrent swelling  5.  Return to clinic in 6 months or earlier if problem

## 2018-10-10 ENCOUNTER — Encounter: Payer: Self-pay | Admitting: Allergy and Immunology

## 2018-10-10 LAB — TSH: TSH: 3.92 u[IU]/mL (ref 0.450–4.500)

## 2018-10-10 LAB — T4, FREE: Free T4: 1.29 ng/dL (ref 0.82–1.77)

## 2018-10-10 LAB — THYROID PEROXIDASE ANTIBODY: Thyroperoxidase Ab SerPl-aCnc: 22 IU/mL (ref 0–34)

## 2018-10-10 LAB — C4 COMPLEMENT: COMPLEMENT C4, SERUM: 28 mg/dL (ref 14–44)

## 2018-10-25 ENCOUNTER — Other Ambulatory Visit: Payer: Self-pay | Admitting: Nurse Practitioner

## 2018-10-25 DIAGNOSIS — M4800 Spinal stenosis, site unspecified: Secondary | ICD-10-CM

## 2018-10-25 DIAGNOSIS — G549 Nerve root and plexus disorder, unspecified: Secondary | ICD-10-CM

## 2018-11-14 ENCOUNTER — Ambulatory Visit
Admission: RE | Admit: 2018-11-14 | Discharge: 2018-11-14 | Disposition: A | Discharge status: Home / Self Care | Payer: Medicare (Managed Care) | Source: Ambulatory Visit | Attending: Nurse Practitioner | Admitting: Nurse Practitioner

## 2018-11-14 DIAGNOSIS — M4800 Spinal stenosis, site unspecified: Secondary | ICD-10-CM

## 2018-11-14 DIAGNOSIS — G549 Nerve root and plexus disorder, unspecified: Secondary | ICD-10-CM

## 2018-11-28 DIAGNOSIS — M5416 Radiculopathy, lumbar region: Secondary | ICD-10-CM | POA: Insufficient documentation

## 2018-11-29 DIAGNOSIS — M47816 Spondylosis without myelopathy or radiculopathy, lumbar region: Secondary | ICD-10-CM | POA: Insufficient documentation

## 2018-12-14 ENCOUNTER — Emergency Department (HOSPITAL_COMMUNITY): Payer: Medicare (Managed Care)

## 2018-12-14 ENCOUNTER — Inpatient Hospital Stay (HOSPITAL_COMMUNITY)
Admission: EM | Admit: 2018-12-14 | Discharge: 2018-12-22 | DRG: 029 | Disposition: A | Discharge status: Skilled Nursing Facility | Payer: Medicare (Managed Care) | Attending: Internal Medicine | Admitting: Internal Medicine

## 2018-12-14 ENCOUNTER — Encounter (HOSPITAL_COMMUNITY): Payer: Self-pay | Admitting: Internal Medicine

## 2018-12-14 DIAGNOSIS — Z882 Allergy status to sulfonamides status: Secondary | ICD-10-CM

## 2018-12-14 DIAGNOSIS — R200 Anesthesia of skin: Secondary | ICD-10-CM | POA: Diagnosis not present

## 2018-12-14 DIAGNOSIS — I10 Essential (primary) hypertension: Secondary | ICD-10-CM | POA: Diagnosis present

## 2018-12-14 DIAGNOSIS — T502X5A Adverse effect of carbonic-anhydrase inhibitors, benzothiadiazides and other diuretics, initial encounter: Secondary | ICD-10-CM | POA: Diagnosis not present

## 2018-12-14 DIAGNOSIS — G9589 Other specified diseases of spinal cord: Secondary | ICD-10-CM | POA: Diagnosis present

## 2018-12-14 DIAGNOSIS — G992 Myelopathy in diseases classified elsewhere: Secondary | ICD-10-CM | POA: Diagnosis present

## 2018-12-14 DIAGNOSIS — Z7982 Long term (current) use of aspirin: Secondary | ICD-10-CM

## 2018-12-14 DIAGNOSIS — G8929 Other chronic pain: Secondary | ICD-10-CM | POA: Diagnosis present

## 2018-12-14 DIAGNOSIS — F329 Major depressive disorder, single episode, unspecified: Secondary | ICD-10-CM | POA: Diagnosis present

## 2018-12-14 DIAGNOSIS — F419 Anxiety disorder, unspecified: Secondary | ICD-10-CM | POA: Diagnosis present

## 2018-12-14 DIAGNOSIS — W010XXA Fall on same level from slipping, tripping and stumbling without subsequent striking against object, initial encounter: Secondary | ICD-10-CM | POA: Diagnosis present

## 2018-12-14 DIAGNOSIS — R402143 Coma scale, eyes open, spontaneous, at hospital admission: Secondary | ICD-10-CM | POA: Diagnosis present

## 2018-12-14 DIAGNOSIS — K219 Gastro-esophageal reflux disease without esophagitis: Secondary | ICD-10-CM | POA: Diagnosis present

## 2018-12-14 DIAGNOSIS — Y92230 Patient room in hospital as the place of occurrence of the external cause: Secondary | ICD-10-CM | POA: Diagnosis not present

## 2018-12-14 DIAGNOSIS — R269 Unspecified abnormalities of gait and mobility: Secondary | ICD-10-CM | POA: Diagnosis present

## 2018-12-14 DIAGNOSIS — E1165 Type 2 diabetes mellitus with hyperglycemia: Secondary | ICD-10-CM | POA: Diagnosis not present

## 2018-12-14 DIAGNOSIS — R8271 Bacteriuria: Secondary | ICD-10-CM | POA: Diagnosis present

## 2018-12-14 DIAGNOSIS — W19XXXA Unspecified fall, initial encounter: Secondary | ICD-10-CM

## 2018-12-14 DIAGNOSIS — Z981 Arthrodesis status: Secondary | ICD-10-CM

## 2018-12-14 DIAGNOSIS — Z880 Allergy status to penicillin: Secondary | ICD-10-CM

## 2018-12-14 DIAGNOSIS — E871 Hypo-osmolality and hyponatremia: Secondary | ICD-10-CM | POA: Diagnosis not present

## 2018-12-14 DIAGNOSIS — Y92009 Unspecified place in unspecified non-institutional (private) residence as the place of occurrence of the external cause: Secondary | ICD-10-CM

## 2018-12-14 DIAGNOSIS — Z993 Dependence on wheelchair: Secondary | ICD-10-CM

## 2018-12-14 DIAGNOSIS — M79605 Pain in left leg: Secondary | ICD-10-CM | POA: Diagnosis present

## 2018-12-14 DIAGNOSIS — Z79899 Other long term (current) drug therapy: Secondary | ICD-10-CM

## 2018-12-14 DIAGNOSIS — N179 Acute kidney failure, unspecified: Secondary | ICD-10-CM | POA: Diagnosis not present

## 2018-12-14 DIAGNOSIS — R32 Unspecified urinary incontinence: Secondary | ICD-10-CM | POA: Diagnosis present

## 2018-12-14 DIAGNOSIS — Z87440 Personal history of urinary (tract) infections: Secondary | ICD-10-CM

## 2018-12-14 DIAGNOSIS — M4312 Spondylolisthesis, cervical region: Secondary | ICD-10-CM | POA: Diagnosis present

## 2018-12-14 DIAGNOSIS — G9520 Unspecified cord compression: Principal | ICD-10-CM

## 2018-12-14 DIAGNOSIS — E1142 Type 2 diabetes mellitus with diabetic polyneuropathy: Secondary | ICD-10-CM | POA: Diagnosis present

## 2018-12-14 DIAGNOSIS — A6000 Herpesviral infection of urogenital system, unspecified: Secondary | ICD-10-CM | POA: Diagnosis present

## 2018-12-14 DIAGNOSIS — M79604 Pain in right leg: Secondary | ICD-10-CM | POA: Diagnosis present

## 2018-12-14 DIAGNOSIS — R402253 Coma scale, best verbal response, oriented, at hospital admission: Secondary | ICD-10-CM | POA: Diagnosis present

## 2018-12-14 DIAGNOSIS — R531 Weakness: Secondary | ICD-10-CM | POA: Diagnosis present

## 2018-12-14 DIAGNOSIS — H903 Sensorineural hearing loss, bilateral: Secondary | ICD-10-CM | POA: Diagnosis present

## 2018-12-14 DIAGNOSIS — R296 Repeated falls: Secondary | ICD-10-CM | POA: Diagnosis present

## 2018-12-14 DIAGNOSIS — M4314 Spondylolisthesis, thoracic region: Secondary | ICD-10-CM | POA: Diagnosis present

## 2018-12-14 DIAGNOSIS — E119 Type 2 diabetes mellitus without complications: Secondary | ICD-10-CM

## 2018-12-14 DIAGNOSIS — Z419 Encounter for procedure for purposes other than remedying health state, unspecified: Secondary | ICD-10-CM

## 2018-12-14 DIAGNOSIS — Z66 Do not resuscitate: Secondary | ICD-10-CM | POA: Diagnosis present

## 2018-12-14 DIAGNOSIS — E785 Hyperlipidemia, unspecified: Secondary | ICD-10-CM | POA: Diagnosis present

## 2018-12-14 DIAGNOSIS — R202 Paresthesia of skin: Secondary | ICD-10-CM

## 2018-12-14 DIAGNOSIS — M532X4 Spinal instabilities, thoracic region: Secondary | ICD-10-CM | POA: Diagnosis present

## 2018-12-14 DIAGNOSIS — M48062 Spinal stenosis, lumbar region with neurogenic claudication: Secondary | ICD-10-CM | POA: Diagnosis present

## 2018-12-14 DIAGNOSIS — E876 Hypokalemia: Secondary | ICD-10-CM | POA: Diagnosis not present

## 2018-12-14 DIAGNOSIS — Z833 Family history of diabetes mellitus: Secondary | ICD-10-CM

## 2018-12-14 DIAGNOSIS — M532X2 Spinal instabilities, cervical region: Secondary | ICD-10-CM | POA: Diagnosis present

## 2018-12-14 DIAGNOSIS — R402363 Coma scale, best motor response, obeys commands, at hospital admission: Secondary | ICD-10-CM | POA: Diagnosis present

## 2018-12-14 DIAGNOSIS — T380X5A Adverse effect of glucocorticoids and synthetic analogues, initial encounter: Secondary | ICD-10-CM | POA: Diagnosis present

## 2018-12-14 DIAGNOSIS — Q675 Congenital deformity of spine: Secondary | ICD-10-CM

## 2018-12-14 DIAGNOSIS — Z720 Tobacco use: Secondary | ICD-10-CM

## 2018-12-14 LAB — CBC
HCT: 41.2 % (ref 36.0–46.0)
Hemoglobin: 12.6 g/dL (ref 12.0–15.0)
MCH: 27.9 pg (ref 26.0–34.0)
MCHC: 30.6 g/dL (ref 30.0–36.0)
MCV: 91.2 fL (ref 80.0–100.0)
PLATELETS: 185 10*3/uL (ref 150–400)
RBC: 4.52 MIL/uL (ref 3.87–5.11)
RDW: 14 % (ref 11.5–15.5)
WBC: 10.2 10*3/uL (ref 4.0–10.5)
nRBC: 0 % (ref 0.0–0.2)

## 2018-12-14 LAB — URINALYSIS, ROUTINE W REFLEX MICROSCOPIC
Bilirubin Urine: NEGATIVE
GLUCOSE, UA: NEGATIVE mg/dL
Hgb urine dipstick: NEGATIVE
Ketones, ur: NEGATIVE mg/dL
Nitrite: POSITIVE — AB
Protein, ur: NEGATIVE mg/dL
SPECIFIC GRAVITY, URINE: 1.005 (ref 1.005–1.030)
pH: 6 (ref 5.0–8.0)

## 2018-12-14 LAB — COMPREHENSIVE METABOLIC PANEL
ALBUMIN: 3.8 g/dL (ref 3.5–5.0)
ALK PHOS: 72 U/L (ref 38–126)
ALT: 17 U/L (ref 0–44)
ANION GAP: 12 (ref 5–15)
AST: 20 U/L (ref 15–41)
BUN: 10 mg/dL (ref 8–23)
CALCIUM: 9.3 mg/dL (ref 8.9–10.3)
CO2: 25 mmol/L (ref 22–32)
CREATININE: 0.97 mg/dL (ref 0.44–1.00)
Chloride: 102 mmol/L (ref 98–111)
GFR calc Af Amer: >60 mL/min (ref >60)
GFR calc non Af Amer: 56 mL/min — ABNORMAL LOW (ref >60)
GLUCOSE: 177 mg/dL — AB (ref 70–99)
Potassium: 3.8 mmol/L (ref 3.5–5.1)
Sodium: 139 mmol/L (ref 135–145)
Total Bilirubin: 0.3 mg/dL (ref 0.3–1.2)
Total Protein: 6.7 g/dL (ref 6.5–8.1)

## 2018-12-14 LAB — I-STAT CHEM 8, ED
BUN: 12 mg/dL (ref 8–23)
CHLORIDE: 101 mmol/L (ref 98–111)
Calcium, Ion: 1.12 mmol/L — ABNORMAL LOW (ref 1.15–1.40)
Creatinine, Ser: 0.9 mg/dL (ref 0.44–1.00)
Glucose, Bld: 175 mg/dL — ABNORMAL HIGH (ref 70–99)
HEMATOCRIT: 41 % (ref 36.0–46.0)
Hemoglobin: 13.9 g/dL (ref 12.0–15.0)
POTASSIUM: 3.8 mmol/L (ref 3.5–5.1)
SODIUM: 138 mmol/L (ref 135–145)
TCO2: 27 mmol/L (ref 22–32)

## 2018-12-14 LAB — ETHANOL

## 2018-12-14 LAB — I-STAT CG4 LACTIC ACID, ED: Lactic Acid, Venous: 2.53 mmol/L (ref 0.5–1.9)

## 2018-12-14 LAB — PROTIME-INR
INR: 0.98
Prothrombin Time: 12.9 seconds (ref 11.4–15.2)

## 2018-12-14 LAB — CDS SEROLOGY

## 2018-12-14 LAB — SAMPLE TO BLOOD BANK

## 2018-12-14 MED ORDER — OXYCODONE-ACETAMINOPHEN 5-325 MG PO TABS
1.0000 | ORAL_TABLET | Freq: Once | ORAL | Status: AC
  Administered 2018-12-14: 1 via ORAL
  Filled 2018-12-14: qty 1

## 2018-12-14 MED ORDER — LACTATED RINGERS IV BOLUS
1000.0000 mL | Freq: Once | INTRAVENOUS | Status: AC
  Administered 2018-12-14: 1000 mL via INTRAVENOUS

## 2018-12-14 MED ORDER — GADOBUTROL 1 MMOL/ML IV SOLN
8.0000 mL | Freq: Once | INTRAVENOUS | Status: AC | PRN
  Administered 2018-12-14: 8 mL via INTRAVENOUS

## 2018-12-14 MED ORDER — DEXAMETHASONE SODIUM PHOSPHATE 10 MG/ML IJ SOLN
4.0000 mg | Freq: Four times a day (QID) | INTRAMUSCULAR | Status: DC
  Administered 2018-12-15 – 2018-12-19 (×16): 4 mg via INTRAVENOUS
  Filled 2018-12-14 (×18): qty 1

## 2018-12-14 MED ORDER — DEXAMETHASONE SODIUM PHOSPHATE 10 MG/ML IJ SOLN
10.0000 mg | Freq: Once | INTRAMUSCULAR | Status: AC
  Administered 2018-12-14: 10 mg via INTRAVENOUS
  Filled 2018-12-14: qty 1

## 2018-12-14 NOTE — ED Notes (Signed)
Port xray at bedside.  

## 2018-12-14 NOTE — ED Notes (Signed)
Need to recollect blue top tube, patient currently in MRI.

## 2018-12-14 NOTE — ED Notes (Signed)
Pt to MRI at this time.

## 2018-12-14 NOTE — H&P (Addendum)
Date: 12/15/2018               Patient Name:  [name] [name] MRN: [id number]  DOB: [birth year] Age / Sex: 78 y.o., female   PCP: Estevan Oaks, NP         Medical Service: Internal Medicine Teaching Service         Attending Physician: Dr. Burns Spain, MD    First Contact: Dr. Chesley Mires Pager: [phone]  Second Contact: Dr. Lovenia Kim Pager: [phone]       After Hours (After 5p/  First Contact Pager: [phone]  weekends / holidays): Second Contact Pager: [phone]   Chief Complaint: fall  History of Present Illness:  [name] [name] is a 78 y.o female [facility] patient who is wheelchair bound living on her own and has a PMHx of diabetes, gerd, HTN, HLD, bilateral sensorineural hearing loss, peripheral neuropathy, sciatic nerve disease, urinary incontinence who presents after a fall.   On the morning of 1/3, she transferred herself from wheelchair to the toilet and was trying to get up from the toilet when her legs slipped out from under her and she fell on her bottom. She has CNAs that come 4x/week and one of them just happened to be arriving shortly after the fall. She was unable to get up on her own and was brought to the ED. She denies head trauma or LOC. She also denies prodromal symptoms such as dizziness, chest pain, palpitations, headache, or vision changes.   She started using a walker last year and was functioning well - able to cook and clean. A couple months ago she got a wheel chair after she was having worsening bilateral leg weakness/numbness. She fell on the kitchen floor onto her knees approximately 1 month ago and has been having more frequent falls since then and worsening bilateral leg pain. She has a history of low back pain but states this has gotten better over the last month.   She reports falling a dozen times over the past week. She states that she has decreased sensation in both legs and associated numbness and weakness. Her knees also give out on her. She  also describes intermittent shooting pains down her left leg.   Does endorse saddle paresthesia, but denies urinary or bowel incontinence. Denies numbness around the anus.   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 Surgical decompression and fusion was recommended and she was scheduled for surgery later this month.   Currently the patient lives on her own. She has CNAs that come 4 times per week and she [facility] per [facility]. She also has two daughters and one son who live nearby and help with ADLs.    ED Course: 1L LR bolus given Neurosurgery was called and they recommended decadron. Was given Decadron 10mg  once and oxycodone 5-325mg  for pain control in ED and started on decadron 4mg  q6hrs.  Meds:  Current Meds  Medication Sig  . aspirin EC 81 MG tablet Take 81 mg by mouth daily.  Marland Kitchen DHA-Vitamin C-Lutein (EYE HEALTH FORMULA PO) Take 1 capsule by mouth daily.  . DULoxetine (CYMBALTA) 30 MG capsule Take 30 mg by mouth daily.  . Emollient (EUCERIN) lotion Apply topically 4 (four) times daily as needed for dry skin.  Marland Kitchen EPINEPHrine (EPIPEN 2-PAK) 0.3 mg/0.3 mL IJ SOAJ injection Inject 0.3 mg into the muscle  once.  . fexofenadine (ALLEGRA) 180 MG tablet Take 180 mg by mouth daily.  Marland Kitchen lamoTRIgine (LAMICTAL) 25 MG tablet Take 50 mg by mouth 2 (two) times daily.   . Menthol, Topical Analgesic, (BIOFREEZE ROLL-ON COLORLESS) 4 % GEL Apply topically.  . montelukast (SINGULAIR) 10 MG tablet Take 1 tablet (10 mg total) by mouth at bedtime.  Marland Kitchen nystatin (NYSTATIN) powder Apply topically 3 (three) times daily.   Marland Kitchen oxycodone-acetaminophen (PERCOCET) 2.5-325 MG tablet Take 1 tablet by mouth 3 (three) times daily as needed (sciatic pain).   . pantoprazole (PROTONIX) 40 MG tablet Take 40 mg by mouth every morning.  . polyethylene glycol (MIRALAX / GLYCOLAX) packet Take 17 g by mouth  daily.  Marland Kitchen senna-docusate (SENOKOT-S) 8.6-50 MG tablet Take 2 tablets by mouth at bedtime.  . simvastatin (ZOCOR) 10 MG tablet Take 10 mg by mouth daily.   . Sodium Fluoride (PREVIDENT 5000 BOOSTER) 1.1 % PSTE Place 1 application onto teeth daily.  Marland Kitchen triamcinolone (NASACORT ALLERGY 24HR) 55 MCG/ACT AERO nasal inhaler Place 1 spray into the nose daily.  . valACYclovir (VALTREX) 1000 MG tablet Take 1,000 mg by mouth daily.      Allergies: Allergies as of 12/14/2018 - Review Complete 12/14/2018  Allergen Reaction Noted  . Penicillins Nausea Only 12/22/2015  . Sulfa antibiotics Nausea And Vomiting 12/22/2015   Past Medical History:  Diagnosis Date  . Anxiety   . Bowel obstruction (HCC)   . Burning with urination 01/04/2016  . Diabetes mellitus without complication (HCC)   . Genital herpes   . GERD (gastroesophageal reflux disease)   . Hematuria 01/04/2016  . Herpes 12/22/2015  . Hyperlipidemia   . Hypertension   . LLQ abdominal tenderness 01/04/2016  . Neuropathy   . Pelvic pressure in female 01/04/2016  . Recurrent UTI   . Sciatic nerve disease   . Urinary frequency 01/04/2016  . Urticaria     Family History: mother with diabetes, brother with heart disease   Social History: Lives in an apartment by herself. [facility]. Has CNAs who come to house 4 days a week. Daughter, who is HCPOA, also helps at home. She needs assistance with most all ADLs. Denies tobacco or alcohol use.   Review of Systems: A complete ROS was negative except as per HPI.   Physical Exam: Blood pressure (!) 178/78, pulse 80, temperature 98.2 F (36.8 C), temperature source Oral, resp. rate 20, SpO2 96 %. Vitals:   12/14/18 1714 12/14/18 1715 12/14/18 1716 12/15/18 0137  BP:    (!) 178/78  Pulse: 73 73 71 80  Resp:  (!) 21 20 20   Temp:    98.2 F (36.8 C)  TempSrc:    Oral  SpO2: 99% 98% 97% 96%   General: Vital signs reviewed.  Patient is well-developed and well-nourished, in no  acute distress and cooperative with exam.  Head: Normocephalic and atraumatic. Eyes: EOMI, conjunctivae normal, no scleral icterus.  Neck: Supple, trachea midline, normal ROM Cardiovascular: RRR, S1 normal, S2 normal, no murmurs, gallops, or rubs. Pulmonary/Chest: Clear to auscultation bilaterally, no wheezes, rales, or rhonchi. Abdominal: Soft, non-tender, non-distended, BS +, well healed scars in the midline and RLQ Musculoskeletal: No joint deformities, erythema, or stiffness, ROM full and nontender. Extremities: No lower extremity edema bilaterally,  pulses symmetric and intact bilaterally. No cyanosis or clubbing. Neurological: A&O x3, cranial nerve II-XII are grossly intact, no focal motor deficit. 3/5 strength with bilateral leg raise, 5/5 strength with  bilateral dorsi and plantar flexion. 5/5 upper extremity strength bilaterally. Decreased sensation in bilateral legs up to the umbilicus.  Skin: Warm, dry and intact. No rashes or erythema.  Psychiatric: Normal mood and affect. speech and behavior is normal. Cognition and memory are normal.   Labs:  CMP and CBC unremarkable A1c 7.5 Lactate 2.53 UA trace leukocytes, positive nitrite, rare bacteria   EKG: personally reviewed my interpretation is NSR, low voltage likely due to body habitus. No abnormal intervals or ischemic changes  CXR: personally reviewed my interpretation is no pulmonary consolidation or effusion, mild cardiomegaly   MR THORACIC SPINE IMPRESSION  No acute thoracic finding. Multiple cervical and thoracic segmentation anomalies. 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 Consideration of decompression at this level suggested.  MR LUMBAR SPINE IMPRESSION  No acute lumbar finding. Multilevel degenerative changes without compressive central canal stenosis. Potential for neural compression on the left at L4-5 and L5-S1.  Assessment & Plan by Problem: Active Problems:   Cord  compression [facility](HCC)  [name] [name] is a 78 y.o female [facility] patient who is wheelchair bound living on her own and has a PMHx of diabetes, gerd, HTN, HLD, bilateral sensorineural hearing loss, peripheral neuropathy, sciatic nerve disease, urinary incontinence who presents after a fall.   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 Plan was for fusion of l4-L5 at l5-s1 and laminectomy to decompress nerves. Neurosurgery recommended decadron to decrease inflammation. Will follow their recommendations.  -Home assessment to determine if we can create safer home environment -Decadron 4mg  q6hrs - q4h neuro checks  - PT/OT eval  - check B12  Diabetes Mellitus Type 2: Last A1C was 6.5 in 2017. Not on meds at home - A1c this admission was 7.5 -SSI tid wc - expect hyperglycemia with decadron administration  Abnormal UA: trace leukocytes, positive nitrites, and rare bacteria. Denies urinary symptoms. Will hold off on treatment   GERD: continue home pantoprazole and loratadine  MDD/Anxiety: continue home duloxetine and lamotrigine   DVT: subq heparin  Diet: NPO for neursurg eval in the morning  IVFs: none  Code: DNR, confirmed with patient   Dispo: Admit patient to Inpatient with expected length of stay greater than 2 midnights.  Signed: Ali LoweVogel, Marie S, MD 12/15/2018, 2:27 AM  Pager: Pager: [phone]

## 2018-12-14 NOTE — ED Provider Notes (Signed)
[facility] EMERGENCY DEPARTMENT Provider Note   CSN: [id number] Arrival date & time: 12/14/18  1709     History   Chief Complaint Chief Complaint  Patient presents with  . Fall  . Numbness    HPI [name] is a 78 y.o. female.  HPI   Patient is a 78 year old female with a past medical history of DM, GERD, HTN, HDL, peripheral neuropathy, sciatic nerve disease, and urinary incontinence currently wheelchair-bound and living in a facility who presents via EMS for evaluation of fall that occurred earlier today.  Per patient she was moving between her bed in her wheelchair when she fell onto her butt.  Denies striking any other part of her body including her head, upper back, chest, or other extremities.  She denies being on blood thinners.  Denies any LOC.  States she currently does not have any pain but does complain of some paresthesias from her umbilicus into both lower extremities.  Patient states she fell 1 month ago.  She states that this morning  Past Medical History:  Diagnosis Date  . Anxiety   . Bowel obstruction (HCC)   . Burning with urination 01/04/2016  . Diabetes mellitus without complication (HCC)   . Genital herpes   . GERD (gastroesophageal reflux disease)   . Hematuria 01/04/2016  . Herpes 12/22/2015  . Hyperlipidemia   . Hypertension   . LLQ abdominal tenderness 01/04/2016  . Neuropathy   . Pelvic pressure in female 01/04/2016  . Recurrent UTI   . Sciatic nerve disease   . Urinary frequency 01/04/2016  . Urticaria     Patient Active Problem List   Diagnosis Date Noted  . Small bowel obstruction (HCC) 04/27/2016  . Non-insulin dependent type 2 diabetes mellitus (HCC) 04/27/2016  . Depression 04/27/2016  . Anxiety state 04/27/2016  . GERD (gastroesophageal reflux disease) 04/27/2016  . SBO (small bowel obstruction) (HCC) 04/27/2016  . Generalized weakness 02/03/2016  . Essential hypertension 02/03/2016  . Urinary frequency  01/04/2016  . Pelvic pressure in female 01/04/2016  . Hematuria 01/04/2016  . Herpes 12/22/2015    Past Surgical History:  Procedure Laterality Date  . ABDOMINAL HYSTERECTOMY    . bowel obstruction    . COLON SURGERY     blocked colon     OB History    Gravida  3   Para  3   Term      Preterm      AB      Living  3     SAB      TAB      Ectopic      Multiple      Live Births               Home Medications    Prior to Admission medications   Medication Sig Start Date End Date Taking? Authorizing Provider  aspirin EC 81 MG tablet Take 81 mg by mouth daily.   Yes [provider]  DHA-Vitamin C-Lutein (EYE HEALTH FORMULA PO) Take 1 capsule by mouth daily.   Yes [provider]  DULoxetine (CYMBALTA) 30 MG capsule Take 30 mg by mouth daily.   Yes [provider]  Emollient (EUCERIN) lotion Apply topically 4 (four) times daily as needed for dry skin.   Yes [provider]  EPINEPHrine (EPIPEN 2-PAK) 0.3 mg/0.3 mL IJ SOAJ injection Inject 0.3 mg into the muscle once.   Yes [provider]  fexofenadine (ALLEGRA) 180  MG tablet Take 180 mg by mouth daily.   Yes [provider]  lamoTRIgine (LAMICTAL) 25 MG tablet Take 50 mg by mouth 2 (two) times daily.    Yes [provider]  Menthol, Topical Analgesic, (BIOFREEZE ROLL-ON COLORLESS) 4 % GEL Apply topically.   Yes [provider]  montelukast (SINGULAIR) 10 MG tablet Take 1 tablet (10 mg total) by mouth at bedtime. 08/22/18  Yes Kozlow, Alvira Philips, MD  nystatin (NYSTATIN) powder Apply topically 3 (three) times daily.    Yes [provider]  oxycodone-acetaminophen (PERCOCET) 2.5-325 MG tablet Take 1 tablet by mouth 3 (three) times daily as needed (sciatic pain).  07/24/17  Yes [provider]  pantoprazole (PROTONIX) 40 MG tablet Take 40 mg by mouth every morning.   Yes [provider]  polyethylene glycol (MIRALAX /  GLYCOLAX) packet Take 17 g by mouth daily.   Yes [provider]  senna-docusate (SENOKOT-S) 8.6-50 MG tablet Take 2 tablets by mouth at bedtime.   Yes [provider]  simvastatin (ZOCOR) 10 MG tablet Take 10 mg by mouth daily.    Yes [provider]  Sodium Fluoride (PREVIDENT 5000 BOOSTER) 1.1 % PSTE Place 1 application onto teeth daily.   Yes [provider]  triamcinolone (NASACORT ALLERGY 24HR) 55 MCG/ACT AERO nasal inhaler Place 1 spray into the nose daily.   Yes [provider]  valACYclovir (VALTREX) 1000 MG tablet Take 1,000 mg by mouth daily.  05/05/16  Yes [provider]  pantoprazole (PROTONIX) 20 MG tablet Take 1 tablet (20 mg total) by mouth daily. Patient not taking: Reported on 12/14/2018 08/22/18   Jessica Priest, MD  gabapentin (NEURONTIN) 300 MG capsule Take 300 mg by mouth 2 (two) times daily. Reported on 02/03/2016  04/18/16  [provider]    Family History Family History  Problem Relation Age of Onset  . Diabetes Mother   . Heart disease Brother   . Hypertension Brother   . Diabetes Brother   . Hypertension Daughter   . Diabetes Brother   . Diabetes Brother   . Alzheimer's disease Brother   . Anxiety disorder Daughter     Social History Social History   Tobacco Use  . Smoking status: Never Smoker  . Smokeless tobacco: Current User    Types: Snuff  Substance Use Topics  . Alcohol use: No  . Drug use: No     Allergies   Penicillins and Sulfa antibiotics   Review of Systems Review of Systems  Constitutional: Negative for chills and fever.  HENT: Negative for ear pain and sore throat.   Eyes: Negative for pain and visual disturbance.  Respiratory: Negative for cough and shortness of breath.   Cardiovascular: Negative for chest pain and palpitations.  Gastrointestinal: Negative for abdominal pain and vomiting.  Genitourinary: Negative for dysuria and hematuria.  Musculoskeletal: Positive  for arthralgias ( chronic back pain ) and gait problem. Negative for back pain.  Skin: Negative for color change and rash.  Neurological: Positive for weakness ( chronic b/l lower extremities) and numbness ( from nipple line below). Negative for seizures and syncope.  All other systems reviewed and are negative.    Physical Exam Updated Vital Signs BP (!) 152/91   Pulse 71   Resp 20   SpO2 97%   Physical Exam Vitals signs and nursing note reviewed.  Constitutional:      General: She is not in acute distress.    Appearance: Normal  appearance. She is well-developed and normal weight.  HENT:     Head: Normocephalic and atraumatic.     Right Ear: External ear normal.     Left Ear: External ear normal.     Nose: Nose normal.     Mouth/Throat:     Mouth: Mucous membranes are moist.  Eyes:     Conjunctiva/sclera: Conjunctivae normal.  Neck:     Musculoskeletal: Neck supple.  Cardiovascular:     Rate and Rhythm: Normal rate and regular rhythm.     Heart sounds: No murmur.  Pulmonary:     Effort: Pulmonary effort is normal. No respiratory distress.     Breath sounds: Normal breath sounds.  Abdominal:     Palpations: Abdomen is soft.     Tenderness: There is no abdominal tenderness.  Skin:    General: Skin is warm and dry.     Capillary Refill: Capillary refill takes less than 2 seconds.  Neurological:     Mental Status: She is alert and oriented to person, place, and time.     Motor: Weakness ( 3/5 b/l lower extremities. 5/5 b/l upper extremities. sensation intact to light touch throughout) present.      ED Treatments / Results  Labs (all labs ordered are listed, but only abnormal results are displayed) Labs Reviewed  COMPREHENSIVE METABOLIC PANEL - Abnormal; Notable for the following components:      Result Value   Glucose, Bld 177 (*)    GFR calc non Af Amer 56 (*)    All other components within normal limits  URINALYSIS, ROUTINE W REFLEX MICROSCOPIC - Abnormal;  Notable for the following components:   Color, Urine STRAW (*)    Nitrite POSITIVE (*)    Leukocytes, UA TRACE (*)    Bacteria, UA RARE (*)    All other components within normal limits  I-STAT CHEM 8, ED - Abnormal; Notable for the following components:   Glucose, Bld 175 (*)    Calcium, Ion 1.12 (*)    All other components within normal limits  I-STAT CG4 LACTIC ACID, ED - Abnormal; Notable for the following components:   Lactic Acid, Venous 2.53 (*)    All other components within normal limits  URINE CULTURE  CDS SEROLOGY  CBC  ETHANOL  PROTIME-INR  SAMPLE TO BLOOD BANK    EKG None  Radiology Mr Thoracic Spine W Wo Contrast  Result Date: 12/14/2018 CLINICAL DATA:  Fall with back pain. EXAM: MRI THORACIC AND LUMBAR SPINE WITH CONTRAST TECHNIQUE: Multiplanar and multiecho pulse sequences of the thoracic and lumbar spine were obtained without intravenous contrast. CONTRAST:  8 cc Gadavist COMPARISON:  Chest CT 09/21/2018.  Lumbar MRI 11/14/2018. FINDINGS: MRI THORACIC SPINE FINDINGS Alignment: Chronic anterolisthesis at C6-7 and C7-T1 of 3-4 mm. Mild thoracic scoliotic curvature. 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 There is no evidence of acute fracture. Chronic discogenic changes present at the C7-T1 level. 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 Pronounced facet arthropathy with anterolisthesis of 3-4 mm. Consideration of decompression is warranted. Paraspinal and other soft tissues: Negative Disc levels: Non-compressive disc bulges from T4-5 through T9-10. Some discogenic endplate changes at T5-6 and T7-8. MRI LUMBAR SPINE FINDINGS Segmentation:  5 lumbar type vertebral bodies. Alignment: Mild curvature convex to the left. 2 mm retrolisthesis L2-3 and L3-4. 3 mm  anterolisthesis L5-S1. Vertebrae:  No  fracture or primary bone lesion. Conus medullaris and cauda equina: Conus extends to the T12-L1 level. Conus and cauda equina appear normal. Paraspinal and other soft tissues: Negative Disc levels: L1-2: Disc bulge.  No compressive stenosis. L2-3: 2 mm retrolisthesis. Loss of disc height. Endplate osteophytes and mild bulging of the disc. No compressive stenosis. L3-4: 2 mm retrolisthesis. Loss of disc height. Endplate osteophytes and mild bulging of the disc. Mild stenosis of the lateral recesses and foramina but without definite neural compression. L4-5: Mild bulging of the disc, with some foraminal extension on the left. Facet degeneration and hypertrophy left worse than right. Left lateral recess and foraminal stenosis that could cause neural compression. L5-S1: 3 mm anterolisthesis due to advanced facet arthropathy. Mild bulging of the disc. No central canal stenosis. Foraminal narrowing left more than right with potential for neural compression on the left. IMPRESSION: MR THORACIC SPINE IMPRESSION No acute thoracic finding. Multiple cervical and thoracic segmentation anomalies as described above. 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 Consideration of decompression at this level suggested. MR LUMBAR SPINE IMPRESSION No acute lumbar finding. Multilevel degenerative changes without compressive central canal stenosis. Potential for neural compression on the left at L4-5 and L5-S1. Electronically Signed   By: Paulina Fusi M.D.   On: 12/14/2018 21:05   Mr Lumbar Spine W Wo Contrast  Result Date: 12/14/2018 CLINICAL DATA:  Fall with back pain. EXAM: MRI THORACIC AND LUMBAR SPINE WITH CONTRAST TECHNIQUE: Multiplanar and multiecho pulse sequences of the thoracic and lumbar spine were obtained without intravenous contrast. CONTRAST:  8 cc Gadavist COMPARISON:  Chest CT 09/21/2018.  Lumbar MRI 11/14/2018. FINDINGS: MRI THORACIC SPINE FINDINGS Alignment: Chronic anterolisthesis  at C6-7 and C7-T1 of 3-4 mm. Mild thoracic scoliotic curvature. 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 There is no evidence of acute fracture. Chronic discogenic changes present at the C7-T1 level. 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 Pronounced facet arthropathy with anterolisthesis of 3-4 mm. Consideration of decompression is warranted. Paraspinal and other soft tissues: Negative Disc levels: Non-compressive disc bulges from T4-5 through T9-10. Some discogenic endplate changes at T5-6 and T7-8. MRI LUMBAR SPINE FINDINGS Segmentation:  5 lumbar type vertebral bodies. Alignment: Mild curvature convex to the left. 2 mm retrolisthesis L2-3 and L3-4. 3 mm anterolisthesis L5-S1. Vertebrae:  No fracture or primary bone lesion. Conus medullaris and cauda equina: Conus extends to the T12-L1 level. Conus and cauda equina appear normal. Paraspinal and other soft tissues: Negative Disc levels: L1-2: Disc bulge.  No compressive stenosis. L2-3: 2 mm retrolisthesis. Loss of disc height. Endplate osteophytes and mild bulging of the disc. No compressive stenosis. L3-4: 2 mm retrolisthesis. Loss of disc height. Endplate osteophytes and mild bulging of the disc. Mild stenosis of the lateral recesses and foramina but without definite neural compression. L4-5: Mild bulging of the disc, with some foraminal extension on the left. Facet degeneration and hypertrophy left worse than right. Left lateral recess and foraminal stenosis that could cause neural compression. L5-S1: 3 mm anterolisthesis due to advanced facet arthropathy. Mild bulging of the disc. No central canal stenosis. Foraminal narrowing left more than right with potential for neural compression on the left. IMPRESSION: MR THORACIC SPINE IMPRESSION No acute thoracic finding. Multiple  cervical and thoracic segmentation  anomalies as described above. 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 Consideration of decompression at this level suggested. MR LUMBAR SPINE IMPRESSION No acute lumbar finding. Multilevel degenerative changes without compressive central canal stenosis. Potential for neural compression on the left at L4-5 and L5-S1. Electronically Signed   By: Paulina FusiMark  Shogry M.D.   On: 12/14/2018 21:05   Dg Pelvis Portable  Result Date: 12/14/2018 CLINICAL DATA:  Fall EXAM: PORTABLE PELVIS 1-2 VIEWS COMPARISON:  CT 04/27/2016 FINDINGS: Mild SI joint degenerative changes. Surgical clips in the pelvis. Pubic symphysis and rami are intact. No acute displaced fracture or malalignment. IMPRESSION: No acute osseous abnormality Electronically Signed   By: Jasmine PangKim  Fujinaga M.D.   On: 12/14/2018 18:22   Dg Chest Port 1 View  Result Date: 12/14/2018 CLINICAL DATA:  Fall EXAM: PORTABLE CHEST 1 VIEW COMPARISON:  09/21/2018 radiograph and chest CT FINDINGS: Mild cardiomegaly. No focal consolidation or effusion. No pneumothorax. IMPRESSION: No active disease.  Mild cardiomegaly Electronically Signed   By: Jasmine PangKim  Fujinaga M.D.   On: 12/14/2018 18:21    Procedures Procedures (including critical care time)  Medications Ordered in ED Medications  dexamethasone (DECADRON) injection 4 mg (has no administration in time range)  lactated ringers bolus 1,000 mL (1,000 mLs Intravenous New Bag/Given 12/14/18 2121)  gadobutrol (GADAVIST) 1 MMOL/ML injection 8 mL (8 mLs Intravenous Contrast Given 12/14/18 2052)  dexamethasone (DECADRON) injection 10 mg (10 mg Intravenous Given 12/14/18 2153)  oxyCODONE-acetaminophen (PERCOCET/ROXICET) 5-325 MG per tablet 1 tablet (1 tablet Oral Given 12/14/18 2159)     Initial Impression / Assessment and Plan / ED Course  I have reviewed the triage vital signs and the nursing notes.  Pertinent labs & imaging results that were available during my  care of the patient were reviewed by me and considered in my medical decision making (see chart for details).     Patient is a 78 year old female who presents with above-stated history exam.  On presentation patient is afebrile stable vital signs.  Exam as above remarkable bilateral lower extremity weakness with intact sensation to light touch in all extremities.  Given history of recent fall initially a chest and pelvic x-rays were obtained.  These did not show any acute findings or signs of acute traumatic injury.  However given history of numbness that is not reproduced on exam in the context of multiple recent falls and MR T and L-spine was obtained.  MR THORACIC SPINE IMPRESSION No acute thoracic finding. Multiple cervical and thoracic segmentation anomalies as described above. 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 Consideration of decompression at this level suggested.  MR LUMBAR SPINE IMPRESSION No acute lumbar finding. Multilevel degenerative changes without compressive central canal stenosis. Potential for neural compression on the left at L4-5 and L5-S1.  The studies were also reviewed by myself and taken into consideration during medical decision making.  CBC WNL.  CMP WNL.  Neurosurgery was consulted and recommended IV Decadron 4 mg every 6.  Patient admitted to hospital service in stable condition for further evaluation and management.  Patient admitted in stable condition hospital service.   Final Clinical Impressions(s) / ED Diagnoses   Final diagnoses:  Paresthesias  Numbness  Fall in home, initial encounter    ED Discharge Orders    None       Antoine PrimasSmith, , MD 12/14/18 [id number]    Blane OharaZavitz, Joshua, MD 12/15/18 [phone]

## 2018-12-14 NOTE — ED Notes (Signed)
Pt requesting something for pain,  Dr. Michiel Sites made aware

## 2018-12-14 NOTE — ED Triage Notes (Signed)
Pt BIB [facility] EMS after a fall this morning with new onset abdominal numbness. Per EMS patient has fallen multiple times in the last month, lives by herself, and is wheelchair bound. No LOC. No blood thinners. Moves all extremities . VSS.

## 2018-12-15 ENCOUNTER — Other Ambulatory Visit: Payer: Self-pay

## 2018-12-15 DIAGNOSIS — K219 Gastro-esophageal reflux disease without esophagitis: Secondary | ICD-10-CM | POA: Diagnosis present

## 2018-12-15 DIAGNOSIS — R200 Anesthesia of skin: Secondary | ICD-10-CM | POA: Diagnosis present

## 2018-12-15 DIAGNOSIS — E871 Hypo-osmolality and hyponatremia: Secondary | ICD-10-CM | POA: Diagnosis not present

## 2018-12-15 DIAGNOSIS — F419 Anxiety disorder, unspecified: Secondary | ICD-10-CM | POA: Diagnosis present

## 2018-12-15 DIAGNOSIS — M79605 Pain in left leg: Secondary | ICD-10-CM | POA: Diagnosis present

## 2018-12-15 DIAGNOSIS — I10 Essential (primary) hypertension: Secondary | ICD-10-CM | POA: Diagnosis present

## 2018-12-15 DIAGNOSIS — Q675 Congenital deformity of spine: Secondary | ICD-10-CM | POA: Diagnosis not present

## 2018-12-15 DIAGNOSIS — N179 Acute kidney failure, unspecified: Secondary | ICD-10-CM | POA: Diagnosis not present

## 2018-12-15 DIAGNOSIS — E099 Drug or chemical induced diabetes mellitus without complications: Secondary | ICD-10-CM | POA: Diagnosis not present

## 2018-12-15 DIAGNOSIS — R531 Weakness: Secondary | ICD-10-CM | POA: Diagnosis present

## 2018-12-15 DIAGNOSIS — E1142 Type 2 diabetes mellitus with diabetic polyneuropathy: Secondary | ICD-10-CM | POA: Diagnosis present

## 2018-12-15 DIAGNOSIS — Z66 Do not resuscitate: Secondary | ICD-10-CM | POA: Diagnosis present

## 2018-12-15 DIAGNOSIS — R402253 Coma scale, best verbal response, oriented, at hospital admission: Secondary | ICD-10-CM | POA: Diagnosis present

## 2018-12-15 DIAGNOSIS — G9520 Unspecified cord compression: Secondary | ICD-10-CM | POA: Diagnosis present

## 2018-12-15 DIAGNOSIS — R402143 Coma scale, eyes open, spontaneous, at hospital admission: Secondary | ICD-10-CM | POA: Diagnosis present

## 2018-12-15 DIAGNOSIS — G8929 Other chronic pain: Secondary | ICD-10-CM | POA: Diagnosis present

## 2018-12-15 DIAGNOSIS — G992 Myelopathy in diseases classified elsewhere: Secondary | ICD-10-CM | POA: Diagnosis present

## 2018-12-15 DIAGNOSIS — W010XXA Fall on same level from slipping, tripping and stumbling without subsequent striking against object, initial encounter: Secondary | ICD-10-CM | POA: Diagnosis present

## 2018-12-15 DIAGNOSIS — G629 Polyneuropathy, unspecified: Secondary | ICD-10-CM | POA: Diagnosis not present

## 2018-12-15 DIAGNOSIS — G9589 Other specified diseases of spinal cord: Secondary | ICD-10-CM | POA: Diagnosis present

## 2018-12-15 DIAGNOSIS — M48062 Spinal stenosis, lumbar region with neurogenic claudication: Secondary | ICD-10-CM | POA: Diagnosis present

## 2018-12-15 DIAGNOSIS — R030 Elevated blood-pressure reading, without diagnosis of hypertension: Secondary | ICD-10-CM | POA: Diagnosis not present

## 2018-12-15 DIAGNOSIS — M79604 Pain in right leg: Secondary | ICD-10-CM | POA: Diagnosis present

## 2018-12-15 DIAGNOSIS — R402363 Coma scale, best motor response, obeys commands, at hospital admission: Secondary | ICD-10-CM | POA: Diagnosis present

## 2018-12-15 DIAGNOSIS — H903 Sensorineural hearing loss, bilateral: Secondary | ICD-10-CM | POA: Diagnosis present

## 2018-12-15 DIAGNOSIS — Y92230 Patient room in hospital as the place of occurrence of the external cause: Secondary | ICD-10-CM | POA: Diagnosis not present

## 2018-12-15 DIAGNOSIS — R296 Repeated falls: Secondary | ICD-10-CM | POA: Diagnosis present

## 2018-12-15 DIAGNOSIS — M532X4 Spinal instabilities, thoracic region: Secondary | ICD-10-CM | POA: Diagnosis present

## 2018-12-15 DIAGNOSIS — E1165 Type 2 diabetes mellitus with hyperglycemia: Secondary | ICD-10-CM | POA: Diagnosis not present

## 2018-12-15 DIAGNOSIS — R32 Unspecified urinary incontinence: Secondary | ICD-10-CM | POA: Diagnosis present

## 2018-12-15 LAB — CBC
HCT: 41.4 % (ref 36.0–46.0)
Hemoglobin: 12.9 g/dL (ref 12.0–15.0)
MCH: 27.7 pg (ref 26.0–34.0)
MCHC: 31.2 g/dL (ref 30.0–36.0)
MCV: 88.8 fL (ref 80.0–100.0)
PLATELETS: 196 10*3/uL (ref 150–400)
RBC: 4.66 MIL/uL (ref 3.87–5.11)
RDW: 13.9 % (ref 11.5–15.5)
WBC: 9.9 10*3/uL (ref 4.0–10.5)
nRBC: 0 % (ref 0.0–0.2)

## 2018-12-15 LAB — GLUCOSE, CAPILLARY
GLUCOSE-CAPILLARY: 281 mg/dL — AB (ref 70–99)
Glucose-Capillary: 287 mg/dL — ABNORMAL HIGH (ref 70–99)
Glucose-Capillary: 248 mg/dL — ABNORMAL HIGH (ref 70–99)
Glucose-Capillary: 203 mg/dL — ABNORMAL HIGH (ref 70–99)
Glucose-Capillary: 312 mg/dL — ABNORMAL HIGH (ref 70–99)

## 2018-12-15 LAB — SURGICAL PCR SCREEN
MRSA, PCR: NEGATIVE
Staphylococcus aureus: NEGATIVE

## 2018-12-15 LAB — BASIC METABOLIC PANEL
Anion gap: 10 (ref 5–15)
BUN: 12 mg/dL (ref 8–23)
CHLORIDE: 98 mmol/L (ref 98–111)
CO2: 26 mmol/L (ref 22–32)
Calcium: 9.7 mg/dL (ref 8.9–10.3)
Creatinine, Ser: 1.06 mg/dL — ABNORMAL HIGH (ref 0.44–1.00)
GFR calc Af Amer: 59 mL/min — ABNORMAL LOW (ref >60)
GFR calc non Af Amer: 51 mL/min — ABNORMAL LOW (ref >60)
Glucose, Bld: 309 mg/dL — ABNORMAL HIGH (ref 70–99)
POTASSIUM: 4.2 mmol/L (ref 3.5–5.1)
Sodium: 134 mmol/L — ABNORMAL LOW (ref 135–145)

## 2018-12-15 LAB — HEMOGLOBIN A1C
Hgb A1c MFr Bld: 7.5 % — ABNORMAL HIGH (ref 4.8–5.6)
Mean Plasma Glucose: 168.55 mg/dL

## 2018-12-15 LAB — VITAMIN B12: Vitamin B-12: 289 pg/mL (ref 180–914)

## 2018-12-15 LAB — MRSA PCR SCREENING: MRSA by PCR: NEGATIVE

## 2018-12-15 MED ORDER — ACETAMINOPHEN 325 MG PO TABS
650.0000 mg | ORAL_TABLET | Freq: Four times a day (QID) | ORAL | Status: DC | PRN
  Administered 2018-12-15 – 2018-12-18 (×4): 650 mg via ORAL
  Filled 2018-12-15 (×4): qty 2

## 2018-12-15 MED ORDER — TRIAMCINOLONE ACETONIDE 55 MCG/ACT NA AERO
1.0000 | INHALATION_SPRAY | Freq: Every day | NASAL | Status: DC
  Administered 2018-12-15 – 2018-12-22 (×6): 1 via NASAL
  Filled 2018-12-15: qty 10.8

## 2018-12-15 MED ORDER — MUPIROCIN 2 % EX OINT: 1.0000 "application " | TOPICAL_OINTMENT | Freq: Two times a day (BID) | CUTANEOUS | Status: DC

## 2018-12-15 MED ORDER — SENNOSIDES-DOCUSATE SODIUM 8.6-50 MG PO TABS
2.0000 | ORAL_TABLET | Freq: Two times a day (BID) | ORAL | Status: DC
  Administered 2018-12-15 – 2018-12-22 (×14): 2 via ORAL
  Filled 2018-12-15 (×15): qty 2

## 2018-12-15 MED ORDER — ACETAMINOPHEN 650 MG RE SUPP: 650.0000 mg | Freq: Four times a day (QID) | RECTAL | Status: DC | PRN

## 2018-12-15 MED ORDER — SIMVASTATIN 20 MG PO TABS
10.0000 mg | ORAL_TABLET | Freq: Every day | ORAL | Status: DC
  Administered 2018-12-15 – 2018-12-22 (×7): 10 mg via ORAL
  Filled 2018-12-15 (×7): qty 1

## 2018-12-15 MED ORDER — PANTOPRAZOLE SODIUM 40 MG PO TBEC
40.0000 mg | DELAYED_RELEASE_TABLET | Freq: Every day | ORAL | Status: DC
  Administered 2018-12-15 – 2018-12-18 (×4): 40 mg via ORAL
  Filled 2018-12-15 (×4): qty 1

## 2018-12-15 MED ORDER — INSULIN ASPART 100 UNIT/ML ~~LOC~~ SOLN
8.0000 [IU] | Freq: Once | SUBCUTANEOUS | Status: AC
  Administered 2018-12-15: 8 [IU] via SUBCUTANEOUS

## 2018-12-15 MED ORDER — INSULIN GLARGINE 100 UNIT/ML ~~LOC~~ SOLN
5.0000 [IU] | Freq: Once | SUBCUTANEOUS | Status: AC
  Administered 2018-12-15: 5 [IU] via SUBCUTANEOUS
  Filled 2018-12-15: qty 0.05

## 2018-12-15 MED ORDER — HEPARIN SODIUM (PORCINE) 5000 UNIT/ML IJ SOLN
5000.0000 [IU] | Freq: Three times a day (TID) | INTRAMUSCULAR | Status: DC
  Administered 2018-12-15 – 2018-12-18 (×9): 5000 [IU] via SUBCUTANEOUS
  Filled 2018-12-15 (×10): qty 1

## 2018-12-15 MED ORDER — LIDOCAINE 5 % EX PTCH
1.0000 | MEDICATED_PATCH | CUTANEOUS | Status: DC
  Administered 2018-12-15 – 2018-12-22 (×7): 1 via TRANSDERMAL
  Filled 2018-12-15 (×9): qty 1

## 2018-12-15 MED ORDER — ASPIRIN EC 81 MG PO TBEC
81.0000 mg | DELAYED_RELEASE_TABLET | Freq: Every day | ORAL | Status: DC
  Administered 2018-12-15 – 2018-12-22 (×7): 81 mg via ORAL
  Filled 2018-12-15 (×7): qty 1

## 2018-12-15 MED ORDER — LAMOTRIGINE 100 MG PO TABS
50.0000 mg | ORAL_TABLET | Freq: Two times a day (BID) | ORAL | Status: DC
  Administered 2018-12-15 – 2018-12-22 (×15): 50 mg via ORAL
  Filled 2018-12-15: qty 2
  Filled 2018-12-15 (×2): qty 1
  Filled 2018-12-15 (×4): qty 2
  Filled 2018-12-15: qty 1
  Filled 2018-12-15: qty 2
  Filled 2018-12-15: qty 1
  Filled 2018-12-15 (×2): qty 2
  Filled 2018-12-15: qty 1
  Filled 2018-12-15: qty 2
  Filled 2018-12-15: qty 1
  Filled 2018-12-15: qty 2

## 2018-12-15 MED ORDER — INSULIN ASPART 100 UNIT/ML ~~LOC~~ SOLN
0.0000 [IU] | Freq: Three times a day (TID) | SUBCUTANEOUS | Status: DC
  Administered 2018-12-15: 8 [IU] via SUBCUTANEOUS
  Administered 2018-12-15 – 2018-12-16 (×3): 5 [IU] via SUBCUTANEOUS

## 2018-12-15 MED ORDER — DULOXETINE HCL 30 MG PO CPEP
30.0000 mg | ORAL_CAPSULE | Freq: Every day | ORAL | Status: DC
  Administered 2018-12-15 – 2018-12-22 (×7): 30 mg via ORAL
  Filled 2018-12-15 (×7): qty 1

## 2018-12-15 MED ORDER — SENNOSIDES-DOCUSATE SODIUM 8.6-50 MG PO TABS: 1.0000 | ORAL_TABLET | Freq: Every evening | ORAL | Status: DC | PRN

## 2018-12-15 MED ORDER — LORATADINE 10 MG PO TABS
10.0000 mg | ORAL_TABLET | Freq: Every day | ORAL | Status: DC
  Administered 2018-12-15 – 2018-12-22 (×7): 10 mg via ORAL
  Filled 2018-12-15 (×7): qty 1

## 2018-12-15 MED ORDER — ONDANSETRON HCL 4 MG PO TABS: 4.0000 mg | ORAL_TABLET | Freq: Four times a day (QID) | ORAL | Status: DC | PRN

## 2018-12-15 MED ORDER — ONDANSETRON HCL 4 MG/2ML IJ SOLN: 4.0000 mg | Freq: Four times a day (QID) | INTRAMUSCULAR | Status: DC | PRN

## 2018-12-15 MED ORDER — OXYCODONE HCL 5 MG PO TABS
2.5000 mg | ORAL_TABLET | Freq: Three times a day (TID) | ORAL | Status: DC | PRN
  Administered 2018-12-15 – 2018-12-16 (×4): 2.5 mg via ORAL
  Filled 2018-12-15 (×4): qty 1

## 2018-12-15 NOTE — Consult Note (Signed)
Reason for Consult:falling and bilateral leg numbness Referring Physician: Holley RaringSmith  [name] [name] is an 78 y.o. female.  HPI: (Per Dr. Consuello BossierVogel's note):  [name] [name] is a 78 y.o female [facility] patient who is wheelchair bound living on her own and has a PMHx of diabetes, gerd, HTN, HLD, bilateral sensorineural hearing loss, peripheral neuropathy, sciatic nerve disease, urinary incontinence who presents after a fall.   On the morning of 1/3, she transferred herself from wheelchair to the toilet and was trying to get up from the toilet when her legs slipped out from under her and she fell on her bottom. She has CNAs that come 4x/week and one of them just happened to be arriving shortly after the fall. She was unable to get up on her own and was brought to the ED. She denies head trauma or LOC. She also denies prodromal symptoms such as dizziness, chest pain, palpitations, headache, or vision changes.   She started using a walker last year and was functioning well - able to cook and clean. A couple months ago she got a wheel chair after she was having worsening bilateral leg weakness/numbness. She fell on the kitchen floor onto her knees approximately 1 month ago and has been having more frequent falls since then and worsening bilateral leg pain. She has a history of low back pain but states this has gotten better over the last month.   She reports falling a dozen times over the past week. She states that she has decreased sensation in both legs and associated numbness and weakness. Her knees also give out on her. She also describes intermittent shooting pains down her left leg.   Does endorse saddle paresthesia, but denies urinary or bowel incontinence. Denies numbness around the anus.   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 Surgical decompression and fusion was recommended and she was  scheduled for surgery later this month.   Currently the patient lives on her own. She has CNAs that come 4 times per week and [facility]. She also has two daughters and one son who live nearby and help with ADLs.      Reportedly, patient has been Engineer, structuralevaluatedby local neurosurgeon, who has recommended lumbar decompression and fusion surgery.  She does not recall this doctor's name or the details of this proposed surgery.  Patient notes that approximately 10 years ago, she had anterior cervical fusion surgery, but does not remember this doctor's name or the details of that care.    Past Medical History:  Diagnosis Date  . Anxiety   . Bowel obstruction (HCC)   . Burning with urination 01/04/2016  . Diabetes mellitus without complication (HCC)   . Genital herpes   . GERD (gastroesophageal reflux disease)   . Hematuria 01/04/2016  . Herpes 12/22/2015  . Hyperlipidemia   . Hypertension   . LLQ abdominal tenderness 01/04/2016  . Neuropathy   . Pelvic pressure in female 01/04/2016  . Recurrent UTI   . Sciatic nerve disease   . Urinary frequency 01/04/2016  . Urticaria     Past Surgical History:  Procedure Laterality Date  . ABDOMINAL HYSTERECTOMY    . bowel obstruction    . COLON SURGERY     blocked colon    Family History  Problem Relation Age of Onset  . Diabetes Mother   . Heart disease  Brother   . Hypertension Brother   . Diabetes Brother   . Hypertension Daughter   . Diabetes Brother   . Diabetes Brother   . Alzheimer's disease Brother   . Anxiety disorder Daughter     Social History:  reports that she has never smoked. Her smokeless tobacco use includes snuff. She reports that she does not drink alcohol or use drugs.  Allergies:  Allergies  Allergen Reactions  . Penicillins Nausea Only    Has patient had a PCN reaction causing immediate rash, facial/tongue/throat swelling, SOB or lightheadedness with hypotension: Yes Has patient had a PCN reaction  causing severe rash involving mucus membranes or skin necrosis: No Has patient had a PCN reaction that required hospitalization No Has patient had a PCN reaction occurring within the last 10 years: No If all of the above answers are "NO", then may proceed with Cephalosporin use.   . Sulfa Antibiotics Nausea And Vomiting    Medications: I have reviewed the patient's current medications.  Results for orders placed or performed during the hospital encounter of 12/14/18 (from the past 48 hour(s))  CDS serology     Status: None   Collection Time: 12/14/18  6:50 PM  Result Value Ref Range   CDS serology specimen      SPECIMEN WILL BE HELD FOR 14 DAYS IF TESTING IS REQUIRED    Comment: SPECIMEN WILL BE HELD FOR 14 DAYS IF TESTING IS REQUIRED SPECIMEN WILL BE HELD FOR 14 DAYS IF TESTING IS REQUIRED Performed at [facility], [name], [place], [place] [address]   Comprehensive metabolic panel     Status: Abnormal   Collection Time: 12/14/18  6:50 PM  Result Value Ref Range   Sodium 139 135 - 145 mmol/L   Potassium 3.8 3.5 - 5.1 mmol/L   Chloride 102 98 - 111 mmol/L   CO2 25 22 - 32 mmol/L   Glucose, Bld 177 (H) 70 - 99 mg/dL   BUN 10 8 - 23 mg/dL   Creatinine, Ser 1.91 0.44 - 1.00 mg/dL   Calcium 9.3 8.9 - 47.8 mg/dL   Total Protein 6.7 6.5 - 8.1 g/dL   Albumin 3.8 3.5 - 5.0 g/dL   AST 20 15 - 41 U/L   ALT 17 0 - 44 U/L   Alkaline Phosphatase 72 38 - 126 U/L   Total Bilirubin 0.3 0.3 - 1.2 mg/dL   GFR calc non Af Amer 56 (L) >60 mL/min   GFR calc Af Amer >60 >60 mL/min   Anion gap 12 5 - 15    Comment: Performed at [facility], [address], [place], [place] [address]  CBC     Status: None   Collection Time: 12/14/18  6:50 PM  Result Value Ref Range   WBC 10.2 4.0 - 10.5 K/uL   RBC 4.52 3.87 - 5.11 MIL/uL   Hemoglobin 12.6 12.0 - 15.0 g/dL   HCT 13.0 86.5 - 78.4 %   MCV 91.2 80.0 - 100.0 fL   MCH 27.9 26.0 - 34.0 pg   MCHC 30.6 30.0 - 36.0 g/dL   RDW  69.6 29.5 - 28.4 %   Platelets 185 150 - 400 K/uL   nRBC 0.0 0.0 - 0.2 %    Comment: Performed at [facility], [address], [place], [place] [address]  Ethanol     Status: None   Collection Time: 12/14/18  6:50 PM  Result Value Ref Range  Alcohol, Ethyl (B) <10 <10 mg/dL    Comment: (NOTE) Lowest detectable limit for serum alcohol is 10 mg/dL. For medical purposes only. Performed at [facility], [address] [place], [place] [address]   Hemoglobin A1c     Status: Abnormal   Collection Time: 12/14/18  6:50 PM  Result Value Ref Range   Hgb A1c MFr Bld 7.5 (H) 4.8 - 5.6 %    Comment: (NOTE) Pre diabetes:          5.7%-6.4% Diabetes:              >6.4% Glycemic control for   <7.0% adults with diabetes    Mean Plasma Glucose 168.55 mg/dL    Comment: Performed at [facility], [address], [place], [place] [address]  Sample to Blood Bank     Status: None   Collection Time: 12/14/18  7:00 PM  Result Value Ref Range   Blood Bank Specimen SAMPLE AVAILABLE FOR TESTING    Sample Expiration      12/15/2018 Performed at [facility], [address] [place], [place] [address]   I-Stat Chem 8, ED     Status: Abnormal   Collection Time: 12/14/18  7:07 PM  Result Value Ref Range   Sodium 138 135 - 145 mmol/L   Potassium 3.8 3.5 - 5.1 mmol/L   Chloride 101 98 - 111 mmol/L   BUN 12 8 - 23 mg/dL   Creatinine, Ser 2.59 0.44 - 1.00 mg/dL   Glucose, Bld 563 (H) 70 - 99 mg/dL   Calcium, Ion 8.75 (L) 1.15 - 1.40 mmol/L   TCO2 27 22 - 32 mmol/L   Hemoglobin 13.9 12.0 - 15.0 g/dL   HCT 64.3 32.9 - 51.8 %  I-Stat CG4 Lactic Acid, ED     Status: Abnormal   Collection Time: 12/14/18  7:07 PM  Result Value Ref Range   Lactic Acid, Venous 2.53 (HH) 0.5 - 1.9 mmol/L   Comment NOTIFIED PHYSICIAN   Protime-INR     Status: None   Collection Time: 12/14/18  9:37 PM  Result Value Ref Range   Prothrombin Time 12.9 11.4 - 15.2 seconds   INR 0.98     Comment:  Performed at [facility], [address], [place], [place] [address]  Urinalysis, Routine w reflex microscopic     Status: Abnormal   Collection Time: 12/14/18  9:40 PM  Result Value Ref Range   Color, Urine STRAW (A) YELLOW   APPearance CLEAR CLEAR   Specific Gravity, Urine 1.005 1.005 - 1.030   pH 6.0 5.0 - 8.0   Glucose, UA NEGATIVE NEGATIVE mg/dL   Hgb urine dipstick NEGATIVE NEGATIVE   Bilirubin Urine NEGATIVE NEGATIVE   Ketones, ur NEGATIVE NEGATIVE mg/dL   Protein, ur NEGATIVE NEGATIVE mg/dL   Nitrite POSITIVE (A) NEGATIVE   Leukocytes, UA TRACE (A) NEGATIVE   RBC / HPF 0-5 0 - 5 RBC/hpf   WBC, UA 0-5 0 - 5 WBC/hpf   Bacteria, UA RARE (A) NONE SEEN   Squamous Epithelial / LPF 0-5 0 - 5    Comment: Performed at [facility], [address] [place], [place] [address]  MRSA PCR Screening     Status: None   Collection Time: 12/15/18  1:40 AM  Result Value Ref Range   MRSA by PCR NEGATIVE NEGATIVE    Comment:        The GeneXpert MRSA Assay (FDA  approved for NASAL specimens only), is one component of a comprehensive MRSA colonization surveillance program. It is not intended to diagnose MRSA infection nor to guide or monitor treatment for MRSA infections. Performed at [facility], [address], [place], [place] [address]   Glucose, capillary     Status: Abnormal   Collection Time: 12/15/18  1:43 AM  Result Value Ref Range   Glucose-Capillary 287 (H) 70 - 99 mg/dL  Basic metabolic panel     Status: Abnormal   Collection Time: 12/15/18  3:56 AM  Result Value Ref Range   Sodium 134 (L) 135 - 145 mmol/L   Potassium 4.2 3.5 - 5.1 mmol/L   Chloride 98 98 - 111 mmol/L   CO2 26 22 - 32 mmol/L   Glucose, Bld 309 (H) 70 - 99 mg/dL   BUN 12 8 - 23 mg/dL   Creatinine, Ser 9.93 (H) 0.44 - 1.00 mg/dL   Calcium 9.7 8.9 - 57.0 mg/dL   GFR calc non Af Amer 51 (L) >60 mL/min   GFR calc Af Amer 59 (L) >60 mL/min   Anion gap 10 5 - 15    Comment: Performed at  [facility], [address], [place], [place] [address]  CBC     Status: None   Collection Time: 12/15/18  3:56 AM  Result Value Ref Range   WBC 9.9 4.0 - 10.5 K/uL   RBC 4.66 3.87 - 5.11 MIL/uL   Hemoglobin 12.9 12.0 - 15.0 g/dL   HCT 90.3 00.9 - 23.3 %   MCV 88.8 80.0 - 100.0 fL   MCH 27.7 26.0 - 34.0 pg   MCHC 31.2 30.0 - 36.0 g/dL   RDW 00.7 62.2 - 63.3 %   Platelets 196 150 - 400 K/uL   nRBC 0.0 0.0 - 0.2 %    Comment: Performed at [facility], [address], [place], [place] [address]  Vitamin B12     Status: None   Collection Time: 12/15/18  3:56 AM  Result Value Ref Range   Vitamin B-12 289 180 - 914 pg/mL    Comment: (NOTE) This assay is not validated for testing neonatal or myeloproliferative syndrome specimens for Vitamin B12 levels. Performed at [facility], [address], [place], [place] [address]   Glucose, capillary     Status: Abnormal   Collection Time: 12/15/18  6:34 AM  Result Value Ref Range   Glucose-Capillary 248 (H) 70 - 99 mg/dL    Mr Thoracic Spine W Wo Contrast  Result Date: 12/14/2018 CLINICAL DATA:  Fall with back pain. EXAM: MRI THORACIC AND LUMBAR SPINE WITH CONTRAST TECHNIQUE: Multiplanar and multiecho pulse sequences of the thoracic and lumbar spine were obtained without intravenous contrast. CONTRAST:  8 cc Gadavist COMPARISON:  Chest CT 09/21/2018.  Lumbar MRI 11/14/2018. FINDINGS: MRI THORACIC SPINE FINDINGS Alignment: Chronic anterolisthesis at C6-7 and C7-T1 of 3-4 mm. Mild thoracic scoliotic curvature. 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 There is no evidence of acute fracture. Chronic discogenic changes present at the C7-T1 level. 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 Pronounced facet arthropathy with anterolisthesis of 3-4 mm.  Consideration of decompression is warranted. Paraspinal and other soft tissues: Negative Disc levels: Non-compressive disc bulges from T4-5 through T9-10. Some discogenic endplate changes at  T5-6 and T7-8. MRI LUMBAR SPINE FINDINGS Segmentation:  5 lumbar type vertebral bodies. Alignment: Mild curvature convex to the left. 2 mm retrolisthesis L2-3 and L3-4. 3 mm anterolisthesis L5-S1. Vertebrae:  No fracture or primary bone lesion. Conus medullaris and cauda equina: Conus extends to the T12-L1 level. Conus and cauda equina appear normal. Paraspinal and other soft tissues: Negative Disc levels: L1-2: Disc bulge.  No compressive stenosis. L2-3: 2 mm retrolisthesis. Loss of disc height. Endplate osteophytes and mild bulging of the disc. No compressive stenosis. L3-4: 2 mm retrolisthesis. Loss of disc height. Endplate osteophytes and mild bulging of the disc. Mild stenosis of the lateral recesses and foramina but without definite neural compression. L4-5: Mild bulging of the disc, with some foraminal extension on the left. Facet degeneration and hypertrophy left worse than right. Left lateral recess and foraminal stenosis that could cause neural compression. L5-S1: 3 mm anterolisthesis due to advanced facet arthropathy. Mild bulging of the disc. No central canal stenosis. Foraminal narrowing left more than right with potential for neural compression on the left. IMPRESSION: MR THORACIC SPINE IMPRESSION No acute thoracic finding. Multiple cervical and thoracic segmentation anomalies as described above. 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 Consideration of decompression at this level suggested. MR LUMBAR SPINE IMPRESSION No acute lumbar finding. Multilevel degenerative changes without compressive central canal stenosis. Potential for neural compression on the left at L4-5 and L5-S1. Electronically Signed   By: Paulina Fusi M.D.   On: 12/14/2018 21:05   Mr Lumbar Spine W Wo  Contrast  Result Date: 12/14/2018 CLINICAL DATA:  Fall with back pain. EXAM: MRI THORACIC AND LUMBAR SPINE WITH CONTRAST TECHNIQUE: Multiplanar and multiecho pulse sequences of the thoracic and lumbar spine were obtained without intravenous contrast. CONTRAST:  8 cc Gadavist COMPARISON:  Chest CT 09/21/2018.  Lumbar MRI 11/14/2018. FINDINGS: MRI THORACIC SPINE FINDINGS Alignment: Chronic anterolisthesis at C6-7 and C7-T1 of 3-4 mm. Mild thoracic scoliotic curvature. 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 There is no evidence of acute fracture. Chronic discogenic changes present at the C7-T1 level. 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 Pronounced facet arthropathy with anterolisthesis of 3-4 mm. Consideration of decompression is warranted. Paraspinal and other soft tissues: Negative Disc levels: Non-compressive disc bulges from T4-5 through T9-10. Some discogenic endplate changes at T5-6 and T7-8. MRI LUMBAR SPINE FINDINGS Segmentation:  5 lumbar type vertebral bodies. Alignment: Mild curvature convex to the left. 2 mm retrolisthesis L2-3 and L3-4. 3 mm anterolisthesis L5-S1. Vertebrae:  No fracture or primary bone lesion. Conus medullaris and cauda equina: Conus extends to the T12-L1 level. Conus and cauda equina appear normal. Paraspinal and other soft tissues: Negative Disc levels: L1-2: Disc bulge.  No compressive stenosis. L2-3: 2 mm retrolisthesis. Loss of disc height. Endplate osteophytes and mild bulging of the disc. No compressive stenosis. L3-4: 2 mm retrolisthesis. Loss of disc height. Endplate osteophytes and mild bulging of the disc. Mild stenosis of the lateral recesses and foramina but without definite neural compression. L4-5: Mild bulging of the disc, with some foraminal extension on the left. Facet degeneration and  hypertrophy left worse than right. Left lateral recess and foraminal stenosis that could cause neural compression. L5-S1: 3 mm anterolisthesis due to advanced facet arthropathy. Mild bulging of the disc. No central canal stenosis.  Foraminal narrowing left more than right with potential for neural compression on the left. IMPRESSION: MR THORACIC SPINE IMPRESSION No acute thoracic finding. Multiple cervical and thoracic segmentation anomalies as described above. 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 Consideration of decompression at this level suggested. MR LUMBAR SPINE IMPRESSION No acute lumbar finding. Multilevel degenerative changes without compressive central canal stenosis. Potential for neural compression on the left at L4-5 and L5-S1. Electronically Signed   By: Paulina Fusi M.D.   On: 12/14/2018 21:05   Dg Pelvis Portable  Result Date: 12/14/2018 CLINICAL DATA:  Fall EXAM: PORTABLE PELVIS 1-2 VIEWS COMPARISON:  CT 04/27/2016 FINDINGS: Mild SI joint degenerative changes. Surgical clips in the pelvis. Pubic symphysis and rami are intact. No acute displaced fracture or malalignment. IMPRESSION: No acute osseous abnormality Electronically Signed   By: Jasmine Pang M.D.   On: 12/14/2018 18:22   Dg Chest Port 1 View  Result Date: 12/14/2018 CLINICAL DATA:  Fall EXAM: PORTABLE CHEST 1 VIEW COMPARISON:  09/21/2018 radiograph and chest CT FINDINGS: Mild cardiomegaly. No focal consolidation or effusion. No pneumothorax. IMPRESSION: No active disease.  Mild cardiomegaly Electronically Signed   By: Jasmine Pang M.D.   On: 12/14/2018 18:21    Review of Systems - Negative except Per HPI    Blood pressure (!) 167/96, pulse 73, temperature 97.7 F (36.5 C), temperature source Oral, resp. rate 18, SpO2 100 %. Physical Exam  Constitutional: She is oriented to person, place, and time. She appears well-developed and well-nourished.  HENT:  Head: Normocephalic and  atraumatic.  Extremely hard of hearing  Eyes: Pupils are equal, round, and reactive to light.  Neck: Normal range of motion and full passive range of motion without pain.  Neurological: She is alert and oriented to person, place, and time. She has normal strength. A sensory deficit is present. No cranial nerve deficit. GCS eye subscore is 4. GCS verbal subscore is 5. GCS motor subscore is 6. She displays Babinski's sign on the right side. She displays Babinski's sign on the left side.  Reflex Scores:      Tricep reflexes are 2+ on the right side and 2+ on the left side.      Bicep reflexes are 2+ on the right side and 2+ on the left side.      Brachioradialis reflexes are 2+ on the right side and 2+ on the left side.      Patellar reflexes are 3+ on the right side and 2+ on the left side. Patient has poor stability and has difficulty sitting up on edge of bed without falling backwards.  Patient has numbness across abdomen and in both legs.  She denies perineal numbness.     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  She has had anterior cervical surgery, the details of which she does not recall.  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  I have recommended to the patient that she undergo posterior decompression and fusion surgery for cord compression at the C 7 T 1 level.  She asked me to call her daughter, who is her POA, but she is asleep at present.  Her husband says he will have her call me when she wakes up.  I will find  out who the patient's neurosurgeon is and since she is actively under that doctor's care, I will review these new developments with that doctor.  In the meantime, patient feels that her numbness is a bit better with the steroids.    Dorian Heckle, MD 12/15/2018, 11:05 AM

## 2018-12-15 NOTE — Progress Notes (Signed)
I called to speak with patient's daughter.  I spoke with her husband, who informed me she is asleep.  He will have her call me when she wakes up.

## 2018-12-15 NOTE — ED Notes (Signed)
Tally Joe (daughter) [phone]

## 2018-12-15 NOTE — Plan of Care (Signed)
  Problem: Pain Managment: Goal: General experience of comfort will improve Outcome: Progressing   Problem: Safety: Goal: Ability to remain free from injury will improve Outcome: Progressing   

## 2018-12-15 NOTE — Progress Notes (Addendum)
   Subjective: [name] [name] continues to have lower extremity pain but has been relieved by oxycodone.  She does not have any other acute complaints.  She does state that she is unable to walk around due to her weakness and usually has to use a wheelchair.    Objective:  Vital signs in last 24 hours: Vitals:   12/15/18 0137 12/15/18 0416 12/15/18 0639 12/15/18 0846  BP: (!) 178/78 (!) 190/89 (!) 170/99 (!) 167/96  Pulse: 80 75 74 73  Resp: 20 20  18   Temp: 98.2 F (36.8 C) 97.8 F (36.6 C)  97.7 F (36.5 C)  TempSrc: Oral Oral  Oral  SpO2: 96% 98% 96% 100%   Physical Exam Vitals signs and nursing note reviewed.  Constitutional:      Appearance: Normal appearance.  Cardiovascular:     Rate and Rhythm: Normal rate and regular rhythm.  Pulmonary:     Effort: Pulmonary effort is normal. No respiratory distress.     Breath sounds: Normal breath sounds.  Neurological:     Mental Status: She is alert and oriented to person, place, and time. Mental status is at baseline.  Psychiatric:        Mood and Affect: Mood normal.        Behavior: Behavior normal.     Assessment/Plan:  Active Problems:   Cord compression [facility])  [name] [name] has multilevel lumbar degenerative disease and was admitted for increasing weakness with significant myelopathy with cervical/thoracic instability with cord compression and cord signal changes.  Neurosurgery has recommended a posterior decompression and fusion surgery for cord compression.  He will follow-up with her healthcare power of attorney so that they may make a decision.  She is also being treated with Decadron to decrease spinal edema.  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 Likely neuropathic in nature. - Oxycodone 2.5 mg TID PRN  Type 2 diabetes: Not on any home medications.  Will start  Lantus 5 units daily plus sliding scale insulin. - Lantus 5 units daily - Sliding scale insulin  Asymptomatic bacteriuria: Denies any urinary symptoms.  We will hold off on treatment at this time.  DVT: subq heparin  Diet: heart healthy/carb modified IVFs: none  Code: DNR  Dispo: Anticipated discharge in approximately 5-7 days.  Synetta Shadow, MD 12/15/2018, 11:50 AM Pager: [phone]

## 2018-12-16 LAB — BASIC METABOLIC PANEL
Anion gap: 12 (ref 5–15)
BUN: 25 mg/dL — ABNORMAL HIGH (ref 8–23)
CO2: 27 mmol/L (ref 22–32)
Calcium: 9.5 mg/dL (ref 8.9–10.3)
Chloride: 97 mmol/L — ABNORMAL LOW (ref 98–111)
Creatinine, Ser: 1.25 mg/dL — ABNORMAL HIGH (ref 0.44–1.00)
GFR calc Af Amer: 48 mL/min — ABNORMAL LOW (ref >60)
GFR calc non Af Amer: 41 mL/min — ABNORMAL LOW (ref >60)
Glucose, Bld: 250 mg/dL — ABNORMAL HIGH (ref 70–99)
Potassium: 4.3 mmol/L (ref 3.5–5.1)
Sodium: 136 mmol/L (ref 135–145)

## 2018-12-16 LAB — GLUCOSE, CAPILLARY
GLUCOSE-CAPILLARY: 232 mg/dL — AB (ref 70–99)
Glucose-Capillary: 186 mg/dL — ABNORMAL HIGH (ref 70–99)
Glucose-Capillary: 205 mg/dL — ABNORMAL HIGH (ref 70–99)
Glucose-Capillary: 225 mg/dL — ABNORMAL HIGH (ref 70–99)
Glucose-Capillary: 312 mg/dL — ABNORMAL HIGH (ref 70–99)

## 2018-12-16 MED ORDER — LABETALOL HCL 5 MG/ML IV SOLN
5.0000 mg | Freq: Once | INTRAVENOUS | Status: DC | PRN
  Filled 2018-12-16: qty 4

## 2018-12-16 MED ORDER — SODIUM CHLORIDE 0.9 % IV BOLUS
1000.0000 mL | Freq: Once | INTRAVENOUS | Status: AC
  Administered 2018-12-16: 1000 mL via INTRAVENOUS

## 2018-12-16 MED ORDER — INSULIN ASPART 100 UNIT/ML ~~LOC~~ SOLN
0.0000 [IU] | Freq: Three times a day (TID) | SUBCUTANEOUS | Status: DC
  Administered 2018-12-16: 7 [IU] via SUBCUTANEOUS
  Administered 2018-12-16: 4 [IU] via SUBCUTANEOUS
  Administered 2018-12-17: 11 [IU] via SUBCUTANEOUS
  Administered 2018-12-17 (×2): 7 [IU] via SUBCUTANEOUS
  Administered 2018-12-18: 11 [IU] via SUBCUTANEOUS
  Administered 2018-12-18: 4 [IU] via SUBCUTANEOUS
  Administered 2018-12-18: 11 [IU] via SUBCUTANEOUS
  Administered 2018-12-19: 7 [IU] via SUBCUTANEOUS
  Administered 2018-12-19: 11 [IU] via SUBCUTANEOUS
  Administered 2018-12-20: 4 [IU] via SUBCUTANEOUS

## 2018-12-16 MED ORDER — INSULIN GLARGINE 100 UNIT/ML ~~LOC~~ SOLN
10.0000 [IU] | Freq: Every day | SUBCUTANEOUS | Status: DC
  Administered 2018-12-16: 10 [IU] via SUBCUTANEOUS
  Filled 2018-12-16 (×2): qty 0.1

## 2018-12-16 NOTE — Progress Notes (Signed)
   Subjective:    No acute events overnight.  Ms. Disharoon reports feeling better today and denies pain.  He was enjoying her breakfast when seen this morning and had no acute complaints.  Objective:  Vital signs in last 24 hours: Vitals:   12/15/18 1559 12/15/18 2026 12/16/18 0004 12/16/18 0509  BP:  (!) 170/95  (!) 197/97  Pulse:  93  79  Resp:  16  14  Temp:  98.5 F (36.9 C)  98.3 F (36.8 C)  TempSrc:  Oral  Oral  SpO2:  99%  100%  Weight: 87.1 kg  86.7 kg   Height: 5' (1.524 m)       General: elderly female, sitting up in bed eating breakfast in no acute distress  Neuro: decreased sensation and 3/5 strength in bilateral LE, sensation improved from yesterday  Assessment/Plan:  Principal Problem:   Cord compression (HCC) Active Problems:   Non-insulin dependent type 2 diabetes mellitus (HCC)  Ms. Sferrazza has multilevel lumbar degenerative disease and was admitted for increasing weakness with significant myelopathy with cervical/thoracic instability with cord compression and edema.  Neurosurgery has recommended a posterior decompression and fusion surgery for cord compression.   Myelopathy with cervical/thoracic instability with cord compression and edema: Patient is doing well and denies pain today. She was able to stand and sit up in the chair with assistance from PT. Dr. Venetia Maxon from neurosurgery continues to follow and recommends surgery for this urgent condition. Will need prior authorization from insurance. Will speak to [facility] staff to arrange this.  -Continue Decadron 4 mg every 6 hours - Every 4 hours neuro checks - PT/OT recommending SNF, SW consulted. Daughter agrees with SNF.   Neuropathic LE pain:  - Oxycodone 2.5 mg TID PRN  Type 2 diabetes: CBG 200-300s. Will increase Lantus to 10 units QD and continue SSI. Will monitor CBG today as may need further increase in insulin dose while on Decadron.   Elevated BP: No history of HTN and not on any BP medications at home.  Suspect this is secondary to high dose steroids. Will continue to monitor and plan to order IV labetalol if sBP > 180.    DVT: subq heparin  Diet: heart healthy/carb modified IVFs: none  Code: Full code, will discuss with POA today   Dispo: Anticipated discharge pending discussion of surgery with family.   Burna Cash, MD 12/16/2018, 5:57 AM Pager: [phone]

## 2018-12-16 NOTE — Evaluation (Addendum)
Physical Therapy Evaluation Patient Details Name: [name] [name] MRN: [id number] DOB: 03-28-1941 Today's Date: 12/16/2018   History of Present Illness  78 y.o female [facility] patient who is wheelchair bound living on her own and has a PMHx of diabetes, gerd, HTN, HLD, bilateral sensorineural hearing loss, peripheral neuropathy, sciatic nerve disease, urinary incontinence who presents after a fall. She reports multi recent falls and BLE numbness and weakness. 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 Surgical decompression and fusion was recommended and she was scheduled for surgery later this month. Pt is currently in consultation with Dr. Venetia Maxon regarding surgical recommendations.     Clinical Impression  Pt admitted with above diagnosis. Pt currently with functional limitations due to the deficits listed below (see PT Problem List). PTA pt lived alone, utilizing wheelchair for primary mobility. She has had progressive weakness over past several months transitioning from RW to w/c due to multi falls. On eval, she required min guard assist bed mobility and min assist transfers. Pt is currently under consultation with Dr. Venetia Maxon regarding possible spinal surgery during this admission. He is advising against lumbar fusion. Pt will benefit from skilled PT to increase their independence and safety with mobility to allow discharge to the venue listed below.       Follow Up Recommendations SNF    Equipment Recommendations  None recommended by PT    Recommendations for Other Services       Precautions / Restrictions Precautions Precautions: Fall      Mobility  Bed Mobility Overal bed mobility: Needs Assistance Bed Mobility: Supine to Sit     Supine to sit: HOB elevated;Min guard     General bed mobility comments: +rail, min guard for safety, no physical  assist  Transfers Overall transfer level: Needs assistance   Transfers: Sit to/from Stand;Stand Pivot Transfers Sit to Stand: Min assist Stand pivot transfers: Min assist       General transfer comment: sit to stand x 3 reps with RW for bathing/hygeine, SPT without AD bed>recliner>toilet with continuous BUE support on rails vs armrest. Pt maintaining flexed position during transfer.   Ambulation/Gait             General Gait Details: not assessed  Stairs            Wheelchair Mobility    Modified Rankin (Stroke Patients Only)       Balance Overall balance assessment: Needs assistance Sitting-balance support: No upper extremity supported;Feet unsupported Sitting balance-Leahy Scale: Good     Standing balance support: Bilateral upper extremity supported;During functional activity Standing balance-Leahy Scale: Poor Standing balance comment: reliant on BUE support                             Pertinent Vitals/Pain Pain Assessment: No/denies pain    Home Living Family/patient expects to be discharged to:: Private residence Living Arrangements: Alone Available Help at Discharge: Family;Personal care attendant Type of Home: Apartment Home Access: Level entry     Home Layout: One level Home Equipment: Walker - 2 wheels;Wheelchair - manual;Bedside commode Additional Comments: 2 days/week at [employer]    Prior Function Level of Independence: Needs assistance   Gait / Transfers Assistance Needed: Mod I transfers and wheelchair use in the house  ADL's / Homemaking Assistance Needed: must wear briefs, PCA assists with showering  Hand Dominance   Dominant Hand: Right    Extremity/Trunk Assessment   Upper Extremity Assessment Upper Extremity Assessment: Overall WFL for tasks assessed    Lower Extremity Assessment Lower Extremity Assessment: Defer to PT evaluation RLE Deficits / Details: numbness bilat feet RLE Sensation: history of  peripheral neuropathy LLE Deficits / Details: numbness bilat feet LLE Sensation: history of peripheral neuropathy    Cervical / Trunk Assessment Cervical / Trunk Assessment: Kyphotic  Communication   Communication: HOH  Cognition Arousal/Alertness: Awake/alert Behavior During Therapy: WFL for tasks assessed/performed Overall Cognitive Status: Within Functional Limits for tasks assessed                                 General Comments: Pt is very HOH.      General Comments General comments (skin integrity, edema, etc.): pt endorses multiple falls and that she has become weaker gradually over time. pt does not demonstrate any fear of falling at this time    Exercises     Assessment/Plan    PT Assessment Patient needs continued PT services  PT Problem List Decreased strength;Decreased balance;Decreased mobility;Decreased activity tolerance       PT Treatment Interventions Functional mobility training;DME instruction;Balance training;Patient/family education;Gait training;Therapeutic activities;Therapeutic exercise    PT Goals (Current goals can be found in the Care Plan section)  Acute Rehab PT Goals Patient Stated Goal: home after rehab PT Goal Formulation: With patient Time For Goal Achievement: 12/30/18 Potential to Achieve Goals: Good    Frequency Min 3X/week   Barriers to discharge Decreased caregiver support      Co-evaluation PT/OT/SLP Co-Evaluation/Treatment: Yes Reason for Co-Treatment: To address functional/ADL transfers PT goals addressed during session: Mobility/safety with mobility;Balance         AM-PAC PT "6 Clicks" Mobility  Outcome Measure Help needed turning from your back to your side while in a flat bed without using bedrails?: None Help needed moving from lying on your back to sitting on the side of a flat bed without using bedrails?: A Little Help needed moving to and from a bed to a chair (including a wheelchair)?: A  Little Help needed standing up from a chair using your arms (e.g., wheelchair or bedside chair)?: A Little Help needed to walk in hospital room?: A Lot Help needed climbing 3-5 steps with a railing? : Total 6 Click Score: 16    End of Session Equipment Utilized During Treatment: Gait belt Activity Tolerance: Patient tolerated treatment well Patient left: with call bell/phone within reach;with nursing/sitter in room;Other (comment)(in bathroom) Nurse Communication: Mobility status PT Visit Diagnosis: Unsteadiness on feet (R26.81);Muscle weakness (generalized) (M62.81);Other abnormalities of gait and mobility (R26.89)    Time: 5520-8022 PT Time Calculation (min) (ACUTE ONLY): 36 min   Charges:   PT Evaluation $PT Eval Moderate Complexity: 1 Mod          Aida Raider, PT  Office # [phone] Pager [phone]   Ilda Foil 12/16/2018, 10:13 AM

## 2018-12-16 NOTE — Clinical Social Work Note (Signed)
Clinical Social Work Assessment  Patient Details  Name: [name] [name] MRN: [id number] Date of Birth: 09-10-1941  Date of referral:  12/16/18               Reason for consult:  Facility Placement                Permission sought to share information with:    Permission granted to share information::     Name::        Agency::     Relationship::     Contact Information:     Housing/Transportation Living arrangements for the past 2 months:  Single Family Home Source of Information:  Adult Children(Daughter Patty- POA ) Patient Interpreter Needed:  None Criminal Activity/Legal Involvement Pertinent to Current Situation/Hospitalization:    Significant Relationships:  Adult Children Lives with:  Self Do you feel safe going back to the place where you live?    Need for family participation in patient care:  Yes (Comment)  Care giving concerns:  Daughter voiced no immediate concerns however did state she was unsure if patient would be able to live independently after SNF placement.     Social Worker assessment / plan:  CSW spoke with patients daughter, Alexia Freestoneatty, via phone regarding discharge plans. Daughter was pleasant and appropriate during conversation and seemed involved with patients care. CSW informed daughter of PT's recommendation for SNF placement once stable for discharge. Daughter agreeable to SNF placement at this time. Per daughter, patient will be needing surgery however it has not yet been scheduled.   Daughter voiced questions regarding patients insurance ([facility]) and was concerned whether insurance would cover surgery/ hospital stay. Daughter states they have been in contact with [facility] to answer her questions.   [place] in [place] and would prefer SNF facility close by. Daughter asked questioned regarding SNF vs. Long- term care. CSW informed daughter the social work department could potentially find a SNF placement that also has long-term care to use  patients Medicaid benefits. Daughter was agreeable with this plan and voiced understand of SNF vs. Long- term care.   CSW will fax out information to SNF facilities however if surgery is completed this hospital stay- clinicals will need to be re-sent.   Employment status:  Retired Health and safety inspectornsurance information:  Other (Comment Required)([facility] ) PT Recommendations:  Skilled Nursing Facility Information / Referral to community resources:  Skilled Nursing Facility  Patient/Family's Response to care:  Daughter appreciated CSW.   Patient/Family's Understanding of and Emotional Response to Diagnosis, Current Treatment, and Prognosis:  Daughter voiced understanding with current plan to seek SNF placement and follow up for long-term care if it is needs once SNF days are completed. Daughter is interested in facilities that are able to do short-term PT and have long-term beds if possible.   Emotional Assessment Appearance:  Appears stated age Attitude/Demeanor/Rapport:    Affect (typically observed):  Unable to Assess Orientation:  Fluctuating Orientation (Suspected and/or reported Sundowners) Alcohol / Substance use:    Psych involvement (Current and /or in the community):  No (Comment)  Discharge Needs  Concerns to be addressed:  No discharge needs identified Readmission within the last 30 days:  No Current discharge risk:  None Barriers to Discharge:  Other(Patient planning on having surgery- will need to be faxed to SNF facilities after surgery.)   [name] M [name], LCSW 12/16/2018, 3:40 PM

## 2018-12-16 NOTE — Progress Notes (Signed)
Janelle FloorUpdated Pat, RN at [facility] with pt permission.

## 2018-12-16 NOTE — Evaluation (Signed)
Occupational Therapy Evaluation Patient Details Name: [name] [name] MRN: [id number] DOB: 04-19-1941 Today's Date: 12/16/2018    History of Present Illness 78 y.o female [facility] patient who is wheelchair bound living on her own and has a PMHx of diabetes, gerd, HTN, HLD, bilateral sensorineural hearing loss, peripheral neuropathy, sciatic nerve disease, urinary incontinence who presents after a fall. She reports multi recent falls and BLE numbness and weakness. 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 Surgical decompression and fusion was recommended and she was scheduled for surgery later this month. Pt is currently in consultation with Dr. Venetia MaxonStern regarding surgical recommendations.    Clinical Impression   PT admitted with see above. Pt currently with functional limitiations due to the deficits listed below (see OT problem list). Pt currently requires min (A) for transfers with generalize muscle weakness. Pt with decr sensation in her feet. Pt eager to participate.   Pt will benefit from skilled OT to increase their independence and safety with adls and balance to allow discharge SNF.     Follow Up Recommendations  SNF    Equipment Recommendations  3 in 1 bedside commode;Wheelchair cushion (measurements OT);Wheelchair (measurements OT)    Recommendations for Other Services       Precautions / Restrictions Precautions Precautions: Fall      Mobility Bed Mobility Overal bed mobility: Needs Assistance Bed Mobility: Supine to Sit     Supine to sit: HOB elevated;Min guard     General bed mobility comments: +rail, min guard for safety, no physical assist  Transfers Overall transfer level: Needs assistance   Transfers: Sit to/from Stand;Stand Pivot Transfers Sit to Stand: Min assist Stand pivot transfers: Min assist       General transfer comment: sit to stand x  3 reps with RW for bathing/hygeine, SPT without AD bed>recliner>toilet with continuous BUE support on rails vs armrest. Pt maintaining flexed position during transfer.     Balance Overall balance assessment: Needs assistance Sitting-balance support: No upper extremity supported;Feet unsupported Sitting balance-Leahy Scale: Good     Standing balance support: Bilateral upper extremity supported;During functional activity Standing balance-Leahy Scale: Poor Standing balance comment: reliant on BUE support                           ADL either performed or assessed with clinical judgement   ADL Overall ADL's : Needs assistance/impaired Eating/Feeding: Set up   Grooming: Oral care;Applying deodorant;Wash/dry face;Wash/dry hands;Set up;Sitting   Upper Body Bathing: Minimal assistance;Sitting   Lower Body Bathing: Maximal assistance;Sit to/from stand   Upper Body Dressing : Minimal assistance   Lower Body Dressing: Maximal assistance;Sit to/from stand   Toilet Transfer: Moderate assistance;BSC;Regular Toilet;Stand-pivot;Grab bars Toilet Transfer Details (indicate cue type and reason): pt uses grab bars at home to complete task so very familiar with sequence Toileting- Clothing Manipulation and Hygiene: Total assistance         General ADL Comments: pt agreeable to surgery during session. Pt states "i want him and i dont have to have my daughter to tell him i want him to do it"     Vision   Vision Assessment?: No apparent visual deficits     Perception     Praxis      Pertinent Vitals/Pain Pain Assessment: No/denies pain     Hand Dominance Right   Extremity/Trunk Assessment Upper Extremity Assessment Upper  Extremity Assessment: Overall WFL for tasks assessed   Lower Extremity Assessment Lower Extremity Assessment: Defer to PT evaluation RLE Deficits / Details: numbness bilat feet RLE Sensation: history of peripheral neuropathy LLE Deficits / Details:  numbness bilat feet LLE Sensation: history of peripheral neuropathy   Cervical / Trunk Assessment Cervical / Trunk Assessment: Kyphotic   Communication Communication Communication: HOH   Cognition Arousal/Alertness: Awake/alert Behavior During Therapy: WFL for tasks assessed/performed Overall Cognitive Status: Within Functional Limits for tasks assessed                                 General Comments: Pt is very HOH.   General Comments  pt endorses multiple falls and that she has become weaker gradually over time. pt does not demonstrate any fear of falling at this time    Exercises     Shoulder Instructions      Home Living Family/patient expects to be discharged to:: Private residence Living Arrangements: Alone Available Help at Discharge: Family;Personal care attendant Type of Home: Apartment Home Access: Level entry     Home Layout: One level     Bathroom Shower/Tub: Producer, television/film/videoWalk-in shower   Bathroom Toilet: Handicapped height Bathroom Accessibility: Yes   Home Equipment: Environmental consultantWalker - 2 wheels;Wheelchair - manual;Bedside commode   Additional Comments[facility]      Prior Functioning/Environment Level of Independence: Needs assistance  Gait / Transfers Assistance Needed: Mod I transfers and wheelchair use in the house ADL's / Homemaking Assistance Needed: must wear briefs, PCA assists with showering            OT Problem List: Decreased strength;Decreased activity tolerance;Impaired balance (sitting and/or standing);Decreased coordination;Decreased safety awareness;Decreased cognition;Decreased knowledge of use of DME or AE;Decreased knowledge of precautions;Obesity      OT Treatment/Interventions: Self-care/ADL training;Therapeutic exercise;Neuromuscular education;Energy conservation;DME and/or AE instruction;Manual therapy;Therapeutic activities;Patient/family education;Balance training    OT Goals(Current goals can be found in the care  plan section) Acute Rehab OT Goals Patient Stated Goal: home after rehab OT Goal Formulation: With patient Time For Goal Achievement: 12/30/18 Potential to Achieve Goals: Good  OT Frequency: Min 2X/week   Barriers to D/C: Decreased caregiver support          Co-evaluation   Reason for Co-Treatment: To address functional/ADL transfers PT goals addressed during session: Mobility/safety with mobility;Balance        AM-PAC OT "6 Clicks" Daily Activity     Outcome Measure Help from another person eating meals?: A Little Help from another person taking care of personal grooming?: A Little Help from another person toileting, which includes using toliet, bedpan, or urinal?: A Lot Help from another person bathing (including washing, rinsing, drying)?: A Lot Help from another person to put on and taking off regular upper body clothing?: A Little Help from another person to put on and taking off regular lower body clothing?: A Lot 6 Click Score: 15   End of Session Equipment Utilized During Treatment: Gait belt Nurse Communication: Mobility status;Precautions  Activity Tolerance: Patient tolerated treatment well Patient left: Other (comment)(in bathroom with RN present)  OT Visit Diagnosis: Unsteadiness on feet (R26.81);Muscle weakness (generalized) (M62.81)                Time: [id number] OT Time Calculation (min): 38 min Charges:  OT General Charges $OT Visit: 1 Visit OT Evaluation $OT Eval Moderate Complexity: 1 Mod   Mateo FlowBrynn Harriett Azar, OTR/L  Acute Rehabilitation  Services Pager: [phone] Office: [phone] .   Mateo Flow 12/16/2018, 10:12 AM

## 2018-12-16 NOTE — Progress Notes (Signed)
Spoke to MD who did order resumption of IV for IVF.

## 2018-12-16 NOTE — Progress Notes (Signed)
Subjective: Patient reports much better with steroids in terms of leg weakness and numbness  Objective: Vital signs in last 24 hours: Temp:  [98.2 F (36.8 C)-98.5 F (36.9 C)] 98.3 F (36.8 C) (01/05 0509) Pulse Rate:  [79-93] 79 (01/05 0509) Resp:  [14-18] 14 (01/05 0509) BP: (157-197)/(76-97) 197/97 (01/05 0509) SpO2:  [99 %-100 %] 100 % (01/05 0509) Weight:  [86.7 kg-87.1 kg] 86.7 kg (01/05 0004)  Intake/Output from previous day: 01/04 0701 - 01/05 0700 In: 240 [P.O.:240] Out: 2200 [Urine:2200] Intake/Output this shift: No intake/output data recorded.  Physical Exam: Up in chair.  Has been able to stand.  Still some numbness in feet, but improved from admission, per patient.  Lab Results: Recent Labs    12/14/18 1850 12/14/18 1907 12/15/18 0356  WBC 10.2  --  9.9  HGB 12.6 13.9 12.9  HCT 41.2 41.0 41.4  PLT 185  --  196   BMET Recent Labs    12/15/18 0356 12/16/18 0558  NA 134* 136  K 4.2 4.3  CL 98 97*  CO2 26 27  GLUCOSE 309* 250*  BUN 12 25*  CREATININE 1.06* 1.25*  CALCIUM 9.7 9.5    Studies/Results: Mr Thoracic Spine W Wo Contrast  Result Date: 12/14/2018 CLINICAL DATA:  Fall with back pain. EXAM: MRI THORACIC AND LUMBAR SPINE WITH CONTRAST TECHNIQUE: Multiplanar and multiecho pulse sequences of the thoracic and lumbar spine were obtained without intravenous contrast. CONTRAST:  8 cc Gadavist COMPARISON:  Chest CT 09/21/2018.  Lumbar MRI 11/14/2018. FINDINGS: MRI THORACIC SPINE FINDINGS Alignment: Chronic anterolisthesis at C6-7 and C7-T1 of 3-4 mm. Mild thoracic scoliotic curvature. 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 There is no evidence of acute fracture. Chronic discogenic changes present at the C7-T1 level. 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 Pronounced facet  arthropathy with anterolisthesis of 3-4 mm. Consideration of decompression is warranted. Paraspinal and other soft tissues: Negative Disc levels: Non-compressive disc bulges from T4-5 through T9-10. Some discogenic endplate changes at T5-6 and T7-8. MRI LUMBAR SPINE FINDINGS Segmentation:  5 lumbar type vertebral bodies. Alignment: Mild curvature convex to the left. 2 mm retrolisthesis L2-3 and L3-4. 3 mm anterolisthesis L5-S1. Vertebrae:  No fracture or primary bone lesion. Conus medullaris and cauda equina: Conus extends to the T12-L1 level. Conus and cauda equina appear normal. Paraspinal and other soft tissues: Negative Disc levels: L1-2: Disc bulge.  No compressive stenosis. L2-3: 2 mm retrolisthesis. Loss of disc height. Endplate osteophytes and mild bulging of the disc. No compressive stenosis. L3-4: 2 mm retrolisthesis. Loss of disc height. Endplate osteophytes and mild bulging of the disc. Mild stenosis of the lateral recesses and foramina but without definite neural compression. L4-5: Mild bulging of the disc, with some foraminal extension on the left. Facet degeneration and hypertrophy left worse than right. Left lateral recess and foraminal stenosis that could cause neural compression. L5-S1: 3 mm anterolisthesis due to advanced facet arthropathy. Mild bulging of the disc. No central canal stenosis. Foraminal narrowing left more than right with potential for neural compression on the left. IMPRESSION: MR THORACIC SPINE IMPRESSION No acute thoracic finding. Multiple cervical and thoracic segmentation anomalies as described above. 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 Consideration of decompression at this level suggested. MR LUMBAR SPINE IMPRESSION No acute lumbar  finding. Multilevel degenerative changes without compressive central canal stenosis. Potential for neural compression on the left at L4-5 and L5-S1. Electronically Signed   By: Paulina Fusi M.D.   On:  12/14/2018 21:05   Mr Lumbar Spine W Wo Contrast  Result Date: 12/14/2018 CLINICAL DATA:  Fall with back pain. EXAM: MRI THORACIC AND LUMBAR SPINE WITH CONTRAST TECHNIQUE: Multiplanar and multiecho pulse sequences of the thoracic and lumbar spine were obtained without intravenous contrast. CONTRAST:  8 cc Gadavist COMPARISON:  Chest CT 09/21/2018.  Lumbar MRI 11/14/2018. FINDINGS: MRI THORACIC SPINE FINDINGS Alignment: Chronic anterolisthesis at C6-7 and C7-T1 of 3-4 mm. Mild thoracic scoliotic curvature. 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 There is no evidence of acute fracture. Chronic discogenic changes present at the C7-T1 level. 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 Pronounced facet arthropathy with anterolisthesis of 3-4 mm. Consideration of decompression is warranted. Paraspinal and other soft tissues: Negative Disc levels: Non-compressive disc bulges from T4-5 through T9-10. Some discogenic endplate changes at T5-6 and T7-8. MRI LUMBAR SPINE FINDINGS Segmentation:  5 lumbar type vertebral bodies. Alignment: Mild curvature convex to the left. 2 mm retrolisthesis L2-3 and L3-4. 3 mm anterolisthesis L5-S1. Vertebrae:  No fracture or primary bone lesion. Conus medullaris and cauda equina: Conus extends to the T12-L1 level. Conus and cauda equina appear normal. Paraspinal and other soft tissues: Negative Disc levels: L1-2: Disc bulge.  No compressive stenosis. L2-3: 2 mm retrolisthesis. Loss of disc height. Endplate osteophytes and mild bulging of the disc. No compressive stenosis. L3-4: 2 mm retrolisthesis. Loss of disc height. Endplate osteophytes and mild bulging of the disc. Mild stenosis of the lateral recesses and foramina but without definite neural compression. L4-5: Mild bulging of the disc, with some foraminal  extension on the left. Facet degeneration and hypertrophy left worse than right. Left lateral recess and foraminal stenosis that could cause neural compression. L5-S1: 3 mm anterolisthesis due to advanced facet arthropathy. Mild bulging of the disc. No central canal stenosis. Foraminal narrowing left more than right with potential for neural compression on the left. IMPRESSION: MR THORACIC SPINE IMPRESSION No acute thoracic finding. Multiple cervical and thoracic segmentation anomalies as described above. 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 Consideration of decompression at this level suggested. MR LUMBAR SPINE IMPRESSION No acute lumbar finding. Multilevel degenerative changes without compressive central canal stenosis. Potential for neural compression on the left at L4-5 and L5-S1. Electronically Signed   By: Paulina Fusi M.D.   On: 12/14/2018 21:05   Dg Pelvis Portable  Result Date: 12/14/2018 CLINICAL DATA:  Fall EXAM: PORTABLE PELVIS 1-2 VIEWS COMPARISON:  CT 04/27/2016 FINDINGS: Mild SI joint degenerative changes. Surgical clips in the pelvis. Pubic symphysis and rami are intact. No acute displaced fracture or malalignment. IMPRESSION: No acute osseous abnormality Electronically Signed   By: Jasmine Pang M.D.   On: 12/14/2018 18:22   Dg Chest Port 1 View  Result Date: 12/14/2018 CLINICAL DATA:  Fall EXAM: PORTABLE CHEST 1 VIEW COMPARISON:  09/21/2018 radiograph and chest CT FINDINGS: Mild cardiomegaly. No focal consolidation or effusion. No pneumothorax. IMPRESSION: No active disease.  Mild cardiomegaly Electronically Signed   By: Jasmine Pang M.D.   On: 12/14/2018 18:21    Assessment/Plan: I discussed situation at length with patient's daughter and  she clarified prior treatment.  Patient is seening Neurosurgeon in [place], who has recommended lumbar fusion.  I have advised patient and her daughter against this surgery.  The patient has cord compression  and myelopathy and a lumbar fusion surgery will not address these issues.  Patient has [facility] with which we do not participate.  Daughter has expressed desire for patient to stay here and to have me perform her surgery.  I explained that this is an urgent condition, which has required acute hospitalization.  Her insurance company will need to authorize her care or arrange transfer to participating facility.  With regard to prior neck surgery, that was 20 years ago, not 10 as the patient thought and [place] was in [place] and not in [place].  Neither patient, nor her daughter remember the name of that doctor.    LOS: 1 day    Dorian HeckleJoseph D Bellamarie Pflug, MD 12/16/2018, 9:19 AM

## 2018-12-16 NOTE — Progress Notes (Signed)
Pt completed of IVF bolus Intravenous fluids were administered. IV to LFA infiltrated. MD called to notify.

## 2018-12-16 NOTE — Plan of Care (Signed)
  Problem: Education: Goal: Knowledge of General Education information will improve Description Including pain rating scale, medication(s)/side effects and non-pharmacologic comfort measures Outcome: Progressing   

## 2018-12-17 ENCOUNTER — Other Ambulatory Visit: Payer: Self-pay | Admitting: Neurosurgery

## 2018-12-17 DIAGNOSIS — E1165 Type 2 diabetes mellitus with hyperglycemia: Secondary | ICD-10-CM

## 2018-12-17 DIAGNOSIS — Z79891 Long term (current) use of opiate analgesic: Secondary | ICD-10-CM

## 2018-12-17 DIAGNOSIS — G629 Polyneuropathy, unspecified: Secondary | ICD-10-CM

## 2018-12-17 DIAGNOSIS — G9520 Unspecified cord compression: Principal | ICD-10-CM

## 2018-12-17 DIAGNOSIS — R030 Elevated blood-pressure reading, without diagnosis of hypertension: Secondary | ICD-10-CM

## 2018-12-17 LAB — BASIC METABOLIC PANEL
Anion gap: 12 (ref 5–15)
BUN: 24 mg/dL — ABNORMAL HIGH (ref 8–23)
CO2: 27 mmol/L (ref 22–32)
Calcium: 9.4 mg/dL (ref 8.9–10.3)
Chloride: 95 mmol/L — ABNORMAL LOW (ref 98–111)
Creatinine, Ser: 0.96 mg/dL (ref 0.44–1.00)
GFR calc Af Amer: >60 mL/min (ref >60)
GFR calc non Af Amer: 57 mL/min — ABNORMAL LOW (ref >60)
Glucose, Bld: 243 mg/dL — ABNORMAL HIGH (ref 70–99)
Potassium: 4.1 mmol/L (ref 3.5–5.1)
Sodium: 134 mmol/L — ABNORMAL LOW (ref 135–145)

## 2018-12-17 LAB — URINE CULTURE

## 2018-12-17 LAB — GLUCOSE, CAPILLARY
Glucose-Capillary: 249 mg/dL — ABNORMAL HIGH (ref 70–99)
Glucose-Capillary: 248 mg/dL — ABNORMAL HIGH (ref 70–99)
Glucose-Capillary: 271 mg/dL — ABNORMAL HIGH (ref 70–99)

## 2018-12-17 MED ORDER — INSULIN GLARGINE 100 UNIT/ML ~~LOC~~ SOLN
13.0000 [IU] | Freq: Every day | SUBCUTANEOUS | Status: DC
  Administered 2018-12-17 – 2018-12-18 (×2): 13 [IU] via SUBCUTANEOUS
  Filled 2018-12-17 (×2): qty 0.13

## 2018-12-17 MED ORDER — INSULIN ASPART 100 UNIT/ML ~~LOC~~ SOLN
3.0000 [IU] | Freq: Three times a day (TID) | SUBCUTANEOUS | Status: DC
  Administered 2018-12-17 – 2018-12-18 (×2): 3 [IU] via SUBCUTANEOUS

## 2018-12-17 MED ORDER — HYDROCHLOROTHIAZIDE 12.5 MG PO CAPS
12.5000 mg | ORAL_CAPSULE | Freq: Every day | ORAL | Status: DC
  Administered 2018-12-17 – 2018-12-18 (×2): 12.5 mg via ORAL
  Filled 2018-12-17 (×2): qty 1

## 2018-12-17 NOTE — Progress Notes (Signed)
   Subjective: [name] has been able to walk to the bathroom with minimal assistance.  She says that her weakness is about the same today.  She denies any pain this morning.  She is otherwise doing well and has no acute complaints.  Objective:  Vital signs in last 24 hours: Vitals:   12/16/18 0941 12/16/18 1522 12/16/18 2116 12/17/18 0526  BP: (!) 155/86 (!) 187/82 (!) 180/67 (!) 192/86  Pulse: 74 66 71 62  Resp:  19 18 20   Temp:  97.8 F (36.6 C) 98.3 F (36.8 C) 98.4 F (36.9 C)  TempSrc:  Oral Oral Oral  SpO2:  99% 98% 99%  Weight:      Height:       General: Elderly female lying in bed, eating her breakfast. Neuro: 3/5 strength in LE's bilaterally. Decreased sensation of LE's.  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  Neurosurgery has recommended a posterior decompression and fusion surgery for cord compression.  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 Venetia Maxon from neurosurgery continues to follow and recommends surgery for this urgent condition. Will need prior authorization from insurance. -Continue Decadron 4 mg every 6 hours - Every 4 hours neuro checks - PT/OT recommending SNF, SW consulted. Daughter agrees with SNF.   Neuropathic LE pain:  - Oxycodone 2.5 mg TID PRN  Type 2 diabetes: CBG remain between 200-300s despite Lantus 10 units QD. Will increase Lantus to 13 units QD and continue SSI.  Elevated BP: No history of HTN and not on any BP medications at home. Suspect this is secondary to high dose steroids. Labetalol PRN added if SBP >180 but not used. Will add HCTZ 12.5 mg QD today.  CZY:SAYT heparin  Diet: heart healthy/carb  modified IVFs: none Code:Full code, will discuss with POA today   Dispo: Anticipated discharge pending discussion of surgery with family.   Synetta Shadow, MD 12/17/2018, 6:57 AM Pager: [phone]

## 2018-12-17 NOTE — Progress Notes (Signed)
Inpatient Diabetes Program Recommendations  AACE/ADA: New Consensus Statement on Inpatient Glycemic Control (2015)  Target Ranges:  Prepandial:   less than 140 mg/dL      Peak postprandial:   less than 180 mg/dL (1-2 hours)      Critically ill patients:  140 - 180 mg/dL   Lab Results  Component Value Date   GLUCAP 249 (H) 12/17/2018   HGBA1C 7.5 (H) 12/14/2018    Review of Glycemic Control Results for [name] [name], [name] [name] (MRN [id number]) as of 12/17/2018 09:54  Ref. Range 12/16/2018 12:00 12/16/2018 16:37 12/16/2018 22:21 12/17/2018 06:16  Glucose-Capillary Latest Ref Range: 70 - 99 mg/dL 683 (H) 419 (H) 622 (H) 249 (H)   Diabetes history: Type 2 DM Outpatient Diabetes medications: none Current orders for Inpatient glycemic control: Lantus 13 units QD, Novolog 0-20 units TID Decadron 4 mg Q6H  Inpatient Diabetes Program Recommendations:    Noted increase to Lantus 13 units.   In the setting of steroids, anticipate blood glucose to be increased, especially post prandially. This is evident in this patient, given 312 mg/dL post prandial value on 1/5. Consider adding meal coverage Novolog 4 units TID (assuming that patient is consuming > 50% of meal). Insulin will most likely need adjustment once steroids are tapered.  Thanks, Lujean Rave, MSN, RNC-OB Diabetes Coordinator [phone] (8a-5p)

## 2018-12-17 NOTE — Progress Notes (Signed)
Subjective: Patient reports doing better.  Walking with walker.  Objective: Vital signs in last 24 hours: Temp:  [97.8 F (36.6 C)-98.4 F (36.9 C)] 98.4 F (36.9 C) (01/06 0526) Pulse Rate:  [62-74] 62 (01/06 0526) Resp:  [18-20] 20 (01/06 0526) BP: (155-192)/(67-86) 192/86 (01/06 0526) SpO2:  [98 %-99 %] 99 % (01/06 0526)  Intake/Output from previous day: 01/05 0701 - 01/06 0700 In: 360 [P.O.:360] Out: 404 [Urine:403; Stool:1] Intake/Output this shift: No intake/output data recorded.  Physical Exam: Strength full in legs.  Some numbness persists.  Lab Results: Recent Labs    12/14/18 1850 12/14/18 1907 12/15/18 0356  WBC 10.2  --  9.9  HGB 12.6 13.9 12.9  HCT 41.2 41.0 41.4  PLT 185  --  196   BMET Recent Labs    12/16/18 0558 12/17/18 0408  NA 136 PENDING  K 4.3 PENDING  CL 97* PENDING  CO2 27 PENDING  GLUCOSE 250* 243*  BUN 25* 24*  CREATININE 1.25* 0.96  CALCIUM 9.5 PENDING    Studies/Results: No results found.  Assessment/Plan: Patient will require surgical decompression and stabilization C 7 / T 1 level.  This is an urgent problem.  I can do this here or patient can be transferred to a facility that accepts patient's insurance.  This is up to [facility] to decide.      LOS: 2 days    Dorian Heckle, MD 12/17/2018, 8:39 AM

## 2018-12-17 NOTE — Progress Notes (Signed)
Patient ID: [name] [name], female   DOB: [birth year], 78 y.o.   MRN: [id number] Office spoke with Dr. Vanessa Kick at [facility], who reviewed Dr. Fredrich Birks notes and imaging. She approves and agrees with plan to proceed with surgery.  Posterior cervical decompression and fusion Cervical seven-Thoracic one with AIRO intra-operative CT  will be scheduled for Wed am Jan 8th.  Permit. NPO after midnight tomorrow night.

## 2018-12-17 NOTE — Progress Notes (Signed)
Internal Medicine Attending:   I saw and examined the patient. I reviewed the resident's note and I agree with the resident's findings and plan as documented in the resident's note.  Patient overall in good spirits today although reports that her weakness is overall unchanged.  As noted continues to have decreased trunk and lower extremities bilaterally and decreased sensation in bilateral lower extremities.  Decadron has caused some hyperglycemia and hypertension we have been adjusting her insulin regimen for the hyperglycemia we will add hydrochlorothiazide for blood pressure control today. For diabetes I agree with Dr. Alric Ran plan however I would add some NovoLog mealtime insulin 3 units 3 times daily with meals.

## 2018-12-18 LAB — METHYLMALONIC ACID(MMA), RND URINE
Creatinine(Crt), U: 0.41 g/L (ref 0.30–3.00)
MMA - Normalized: 1.7 umol/mmol cr (ref 0.5–3.4)
Methylmalonic Acid, Ur: 6.3 umol/L (ref 1.6–29.7)

## 2018-12-18 LAB — BASIC METABOLIC PANEL
Anion gap: 13 (ref 5–15)
BUN: 28 mg/dL — ABNORMAL HIGH (ref 8–23)
CO2: 28 mmol/L (ref 22–32)
CREATININE: 1.07 mg/dL — AB (ref 0.44–1.00)
Calcium: 9.9 mg/dL (ref 8.9–10.3)
Chloride: 93 mmol/L — ABNORMAL LOW (ref 98–111)
GFR calc Af Amer: 58 mL/min — ABNORMAL LOW (ref >60)
GFR calc non Af Amer: 50 mL/min — ABNORMAL LOW (ref >60)
Glucose, Bld: 274 mg/dL — ABNORMAL HIGH (ref 70–99)
Potassium: 4.2 mmol/L (ref 3.5–5.1)
SODIUM: 134 mmol/L — AB (ref 135–145)

## 2018-12-18 LAB — GLUCOSE, CAPILLARY
Glucose-Capillary: 257 mg/dL — ABNORMAL HIGH (ref 70–99)
Glucose-Capillary: 158 mg/dL — ABNORMAL HIGH (ref 70–99)
Glucose-Capillary: 253 mg/dL — ABNORMAL HIGH (ref 70–99)

## 2018-12-18 MED ORDER — INSULIN GLARGINE 100 UNIT/ML ~~LOC~~ SOLN
16.0000 [IU] | Freq: Every day | SUBCUTANEOUS | Status: DC
  Administered 2018-12-19 – 2018-12-22 (×4): 16 [IU] via SUBCUTANEOUS
  Filled 2018-12-18 (×4): qty 0.16

## 2018-12-18 MED ORDER — INSULIN ASPART 100 UNIT/ML ~~LOC~~ SOLN
5.0000 [IU] | Freq: Three times a day (TID) | SUBCUTANEOUS | Status: DC
  Administered 2018-12-18 – 2018-12-22 (×10): 5 [IU] via SUBCUTANEOUS

## 2018-12-18 MED ORDER — HYDROCHLOROTHIAZIDE 25 MG PO TABS: 25.0000 mg | ORAL_TABLET | Freq: Every day | ORAL | Status: DC

## 2018-12-18 NOTE — H&P (View-Only) (Signed)
Subjective: Patient reports "I'm doing ok, a little sore through my hips this morning"  Objective: Vital signs in last 24 hours: Temp:  [97.3 F (36.3 C)-97.5 F (36.4 C)] 97.3 F (36.3 C) (01/07 0352) Pulse Rate:  [63-66] 63 (01/07 0352) Resp:  [14-20] 14 (01/07 0352) BP: (154-165)/(72-90) 165/72 (01/07 0352) SpO2:  [95 %-99 %] 99 % (01/07 0352)  Intake/Output from previous day: 01/06 0701 - 01/07 0700 In: 360 [P.O.:360] Out: 1 [Stool:1] Intake/Output this shift: No intake/output data recorded.  Alert, conversant. MAEW with good strength. Some bilat hip soreness this am. Pt verbalizes understanding of plan for surgery tomorrow morning and agrees.   Lab Results: No results for input(s): WBC, HGB, HCT, PLT in the last 72 hours. BMET Recent Labs    12/17/18 0408 12/18/18 0246  NA 134* 134*  K 4.1 4.2  CL 95* 93*  CO2 27 28  GLUCOSE 243* 274*  BUN 24* 28*  CREATININE 0.96 1.07*  CALCIUM 9.4 9.9    Studies/Results: No results found.  Assessment/Plan: Improved on Decadron  LOS: 3 days  Pt verbalizes understanding of plan for Posterior cervical decompression and fusion C7-T1 tomorrow. She has not spoken with her daughter Alexia Freestone since Sunday. I called to communicate finalized plans: no answer at home #, generic vm left to touch base with her nurse; cell # v.m. full.  NPO after midnight tonight. Permit.  Patient ready for surgery in AM.   [place], Arlys John 12/18/2018, 7:34 AM

## 2018-12-18 NOTE — Progress Notes (Signed)
   Subjective: No acute events overnight. Ms. Renbarger complains of a sinus headache this morning that has somewhat improved with Tylenol. Her back pain is well controlled. Able to ambulate to bathroom ok. Her weakness is about the same. Discussed plan for surgery tomorrow.   Objective:  Vital signs in last 24 hours: Vitals:   12/16/18 2116 12/17/18 0526 12/17/18 2031 12/18/18 0352  BP: (!) 180/67 (!) 192/86 (!) 154/90 (!) 165/72  Pulse: 71 62 66 63  Resp: 18 20 20 14   Temp: 98.3 F (36.8 C) 98.4 F (36.9 C) (!) 97.5 F (36.4 C) (!) 97.3 F (36.3 C)  TempSrc: Oral Oral Oral Oral  SpO2: 98% 99% 95% 99%  Weight:      Height:       Physical Exam Vitals signs and nursing note reviewed.  Constitutional:      Appearance: Normal appearance.  Musculoskeletal:     Comments: 4/5 strength in her LE bilaterally. No tenderness to palpation or edema noted. 5/5 strength in UE bilaterally.  Neurological:     Mental Status: She is alert.     Assessment/Plan:  Principal Problem:   Cord compression [facility]) Active Problems:   Non-insulin dependent type 2 diabetes mellitus (HCC)  Ms. Wash has multilevel lumbar degenerative disease and was admitted for increasing weakness with significant myelopathy with cervical/thoracic instability with cord compression andedema. Neurosurgery has recommended a posterior decompression and fusion surgery for cord compression which will occur tomorrow.  Myelopathy with cervical/thoracic instability with cord compressionand edema: She will undergo neurosurgery tomorrow. She will be NPO at midnight. - Continue Decadron 4 mg every 6 hours - Follow-up post-op recommendations - PT/OTrecommending SNF at discharge, SW consulted. Daughter agrees with SNF.  NeuropathicLE pain:  - Oxycodone 2.5 mg TID PRN  Type 2 diabetes:CBG remain between 200-300s despite Lantus 10 units QD and Novolog 3 units TID with meals. - Increase Lantus to 16 units QD  - Increase  Novolog to 5 units TID with meals.  - Continue SSI resistant  Elevated BP: Blood pressure still elevated with hydrochlorothiazide 12.5 mg daily.   - Increase HCTZ to 25 mg daily.  POL:IDCV heparin  Diet: heart healthy/carb modified, NPO at midnight IVFs: none Code:Full code  Dispo: Anticipated dischargepending surgical outcomes (scheduled for tomorrow)  Synetta Shadow, MD 12/18/2018, 10:50 AM Pager: [phone]

## 2018-12-18 NOTE — Plan of Care (Signed)
  Problem: Education: Goal: Knowledge of General Education information will improve Description Including pain rating scale, medication(s)/side effects and non-pharmacologic comfort measures Outcome: Progressing   

## 2018-12-18 NOTE — Progress Notes (Signed)
Occupational Therapy Treatment Patient Details Name: [name] [name] MRN: [id number] DOB: 12-Jan-1941 Today's Date: 12/18/2018    History of present illness 78 y.o female PACE patient who is wheelchair bound living on her own and has a PMHx of diabetes, gerd, HTN, HLD, bilateral sensorineural hearing loss, peripheral neuropathy, sciatic nerve disease, urinary incontinence who presents after a fall. She reports multi recent falls and BLE numbness and weakness. 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 Surgical decompression and fusion was recommended and she was scheduled for surgery later this month. Pt is currently in consultation with Dr. Venetia MaxonStern regarding surgical recommendations.    OT comments  Pt progressing towards established OT goals. Pt reports she is planning for surgery tomorrow morning "at 6 am." Pt performing toileting and functional mobility with Min-Mod A and RW. Pt performing grooming at sink while seated with set up and supervision. Pt presenting with decreased BLE strength and balance and required seated rest breaks. Pt very motivated to participate in therapy. Continue to recommend dc to post-acute rehab and will continue to follow acutely as admitted.    Follow Up Recommendations  SNF    Equipment Recommendations  3 in 1 bedside commode;Wheelchair cushion (measurements OT);Wheelchair (measurements OT)    Recommendations for Other Services      Precautions / Restrictions Precautions Precautions: Fall Restrictions Weight Bearing Restrictions: No       Mobility Bed Mobility Overal bed mobility: Needs Assistance Bed Mobility: Rolling;Sidelying to Sit;Sit to Sidelying;Supine to Sit Rolling: Min guard;Min assist Sidelying to sit: Min assist Supine to sit: Min guard;HOB elevated   Sit to sidelying: Min assist General bed mobility comments: Pt performing  supine to sit with Min Guard at begining of session. Educating pt on log roll technique in preparation for ADLs and requiring Min A to bring BLEs over EOB and elevate trunk.  Transfers Overall transfer level: Needs assistance Equipment used: Rolling walker (2 wheeled) Transfers: Sit to/from Stand Sit to Stand: Min assist         General transfer comment: Min A for balance and stability in standing. Cues for hand placement    Balance Overall balance assessment: Needs assistance Sitting-balance support: No upper extremity supported;Feet unsupported Sitting balance-Leahy Scale: Good     Standing balance support: Bilateral upper extremity supported;During functional activity Standing balance-Leahy Scale: Poor Standing balance comment: reliant on BUE support                           ADL either performed or assessed with clinical judgement   ADL Overall ADL's : Needs assistance/impaired     Grooming: Oral care;Wash/dry hands;Set up;Sitting;Supervision/safety Grooming Details (indicate cue type and reason): Pt performing hand hygiene and oral care at sink with set up and supervision while seated. Pt fatigued during mobility to sink and required to sit for grooming.                  Toilet Transfer: Moderate assistance;BSC;Regular Toilet;Grab bars;Ambulation Toilet Transfer Details (indicate cue type and reason): Pt requiring Mod A for safe descent to toilet.  Toileting- Clothing Manipulation and Hygiene: Min guard;Sitting/lateral lean       Functional mobility during ADLs: Minimal assistance;Moderate assistance;Rolling walker General ADL Comments: Pt with decreased strength, balance, and activity tolerance. Pt very motivated'     Vision   Vision Assessment?: No apparent visual deficits  Perception     Praxis      Cognition Arousal/Alertness: Awake/alert Behavior During Therapy: WFL for tasks assessed/performed Overall Cognitive Status:  Impaired/Different from baseline Area of Impairment: Safety/judgement;Problem solving                         Safety/Judgement: Decreased awareness of safety   Problem Solving: Slow processing;Requires verbal cues General Comments: Noted some decreased safety awareness and problem solving. Also, very HOH.        Exercises     Shoulder Instructions       General Comments      Pertinent Vitals/ Pain       Pain Assessment: No/denies pain  Home Living                                          Prior Functioning/Environment              Frequency  Min 2X/week        Progress Toward Goals  OT Goals(current goals can now be found in the care plan section)  Progress towards OT goals: Progressing toward goals  Acute Rehab OT Goals Patient Stated Goal: home after rehab OT Goal Formulation: With patient Time For Goal Achievement: 12/30/18 Potential to Achieve Goals: Good ADL Goals Pt Will Perform Upper Body Bathing: with supervision;sitting Pt Will Perform Lower Body Bathing: with min assist;with adaptive equipment;sit to/from stand Pt Will Transfer to Toilet: with min guard assist;bedside commode;stand pivot transfer;grab bars  Plan Discharge plan remains appropriate    Co-evaluation                 AM-PAC OT "6 Clicks" Daily Activity     Outcome Measure   Help from another person eating meals?: A Little Help from another person taking care of personal grooming?: A Little Help from another person toileting, which includes using toliet, bedpan, or urinal?: A Lot Help from another person bathing (including washing, rinsing, drying)?: A Lot Help from another person to put on and taking off regular upper body clothing?: A Little Help from another person to put on and taking off regular lower body clothing?: A Lot 6 Click Score: 15    End of Session Equipment Utilized During Treatment: Gait belt;Rolling walker  OT Visit  Diagnosis: Unsteadiness on feet (R26.81);Muscle weakness (generalized) (M62.81)   Activity Tolerance Patient tolerated treatment well   Patient Left in bed;with call bell/phone within reach;with bed alarm set   Nurse Communication Mobility status;Precautions        Time: [id number] OT Time Calculation (min): 24 min  Charges: OT General Charges $OT Visit: 1 Visit OT Treatments $Self Care/Home Management : 23-37 mins  Raihan Kimmel MSOT, OTR/L Acute Rehab Pager: [phone] Office: [phone]   Theodoro GristCharis M Fredick Schlosser 12/18/2018, 2:43 PM

## 2018-12-18 NOTE — Progress Notes (Addendum)
Subjective: Patient reports "I'm doing ok, a little sore through my hips this morning"  Objective: Vital signs in last 24 hours: Temp:  [97.3 F (36.3 C)-97.5 F (36.4 C)] 97.3 F (36.3 C) (01/07 0352) Pulse Rate:  [63-66] 63 (01/07 0352) Resp:  [14-20] 14 (01/07 0352) BP: (154-165)/(72-90) 165/72 (01/07 0352) SpO2:  [95 %-99 %] 99 % (01/07 0352)  Intake/Output from previous day: 01/06 0701 - 01/07 0700 In: 360 [P.O.:360] Out: 1 [Stool:1] Intake/Output this shift: No intake/output data recorded.  Alert, conversant. MAEW with good strength. Some bilat hip soreness this am. Pt verbalizes understanding of plan for surgery tomorrow morning and agrees.   Lab Results: No results for input(s): WBC, HGB, HCT, PLT in the last 72 hours. BMET Recent Labs    12/17/18 0408 12/18/18 0246  NA 134* 134*  K 4.1 4.2  CL 95* 93*  CO2 27 28  GLUCOSE 243* 274*  BUN 24* 28*  CREATININE 0.96 1.07*  CALCIUM 9.4 9.9    Studies/Results: No results found.  Assessment/Plan: Improved on Decadron  LOS: 3 days  Pt verbalizes understanding of plan for Posterior cervical decompression and fusion C7-T1 tomorrow. She has not spoken with her daughter Patty since Sunday. I called to communicate finalized plans: no answer at home #, generic vm left to touch base with her nurse; cell # v.m. full.  NPO after midnight tonight. Permit.  Patient ready for surgery in AM.   Poteat, Brian 12/18/2018, 7:34 AM    

## 2018-12-18 NOTE — Care Management Important Message (Signed)
Important Message  Patient Details  Name: [name] [name] MRN: [id number] Date of Birth: [birth year]   Medicare Important Message Given:  Yes    Ariany Kesselman Stefan Church 12/18/2018, 3:37 PM

## 2018-12-18 NOTE — Progress Notes (Signed)
Physical Therapy Treatment Patient Details Name: [name] [name] MRN: [id number] DOB: 05/03/1941 Today's Date: 12/18/2018    History of Present Illness 78 y.o female PACE patient who is wheelchair bound living on her own and has a PMHx of diabetes, gerd, HTN, HLD, bilateral sensorineural hearing loss, peripheral neuropathy, sciatic nerve disease, urinary incontinence who presents after a fall. She reports multi recent falls and BLE numbness and weakness. 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 Surgical decompression and fusion was recommended and she was scheduled for surgery later this month. Pt is currently in consultation with Dr. Venetia Maxon regarding surgical recommendations.     PT Comments    Patient seen for mobility progression. Pt is very pleasant and eager to participate in therapy. Pt requires mod/max A for short distance gait training in room. Recommend +2 assist for gait progression given pt's significant bilat LE weakness. Continue to progress as tolerated.    Follow Up Recommendations  SNF     Equipment Recommendations  None recommended by PT    Recommendations for Other Services       Precautions / Restrictions Precautions Precautions: Fall Restrictions Weight Bearing Restrictions: No    Mobility  Bed Mobility Overal bed mobility: Needs Assistance Bed Mobility: Rolling;Sidelying to Sit;Sit to Sidelying Rolling: Min guard Sidelying to sit: Min guard Supine to sit: Min guard;HOB elevated   Sit to sidelying: Min guard General bed mobility comments: min guard for safety; step by step cues for sequencing of log roll technique  Transfers Overall transfer level: Needs assistance Equipment used: Rolling walker (2 wheeled) Transfers: Sit to/from Stand Sit to Stand: Min assist         General transfer comment: assist to  steady  Ambulation/Gait Ambulation/Gait assistance: Mod assist;Max assist Gait Distance (Feet): 20 Feet Assistive device: Rolling walker (2 wheeled) Gait Pattern/deviations: Step-through pattern;Trunk flexed(poor proprioception)     General Gait Details: assistance required for balance, posture, and weight shifting; mod A initially and then max A required when becoming more fatigued; SOB with ambulation and SpO2 and HR WNL   Stairs             Wheelchair Mobility    Modified Rankin (Stroke Patients Only)       Balance Overall balance assessment: Needs assistance Sitting-balance support: No upper extremity supported;Feet unsupported Sitting balance-Leahy Scale: Good     Standing balance support: Bilateral upper extremity supported;During functional activity Standing balance-Leahy Scale: Poor Standing balance comment: reliant on BUE support                            Cognition Arousal/Alertness: Awake/alert Behavior During Therapy: WFL for tasks assessed/performed Overall Cognitive Status: Impaired/Different from baseline Area of Impairment: Safety/judgement;Problem solving                         Safety/Judgement: Decreased awareness of safety   Problem Solving: Slow processing;Requires verbal cues General Comments: Noted some decreased safety awareness and problem solving. Also, very HOH.      Exercises      General Comments        Pertinent Vitals/Pain Pain Assessment: No/denies pain    Home Living                      Prior Function  PT Goals (current goals can now be found in the care plan section) Acute Rehab PT Goals Patient Stated Goal: home after rehab Progress towards PT goals: Progressing toward goals    Frequency    Min 3X/week      PT Plan Current plan remains appropriate    Co-evaluation              AM-PAC PT "6 Clicks" Mobility   Outcome Measure  Help needed turning from  your back to your side while in a flat bed without using bedrails?: A Little Help needed moving from lying on your back to sitting on the side of a flat bed without using bedrails?: A Little Help needed moving to and from a bed to a chair (including a wheelchair)?: A Little Help needed standing up from a chair using your arms (e.g., wheelchair or bedside chair)?: A Little Help needed to walk in hospital room?: A Lot Help needed climbing 3-5 steps with a railing? : Total 6 Click Score: 15    End of Session Equipment Utilized During Treatment: Gait belt Activity Tolerance: Patient tolerated treatment well Patient left: in bed;with call bell/phone within reach;with bed alarm set Nurse Communication: Mobility status PT Visit Diagnosis: Unsteadiness on feet (R26.81);Muscle weakness (generalized) (M62.81);Other abnormalities of gait and mobility (R26.89)     Time: 5809-9833 PT Time Calculation (min) (ACUTE ONLY): 20 min  Charges:  $Gait Training: 8-22 mins                     Erline Levine, PTA Acute Rehabilitation Services Pager: [phone] Office: [phone]     Carolynne Edouard 12/18/2018, 4:39 PM

## 2018-12-19 ENCOUNTER — Inpatient Hospital Stay (HOSPITAL_COMMUNITY): Payer: Medicare (Managed Care)

## 2018-12-19 ENCOUNTER — Inpatient Hospital Stay (HOSPITAL_COMMUNITY): Payer: Medicare (Managed Care) | Admitting: Anesthesiology

## 2018-12-19 ENCOUNTER — Encounter (HOSPITAL_COMMUNITY): Payer: Self-pay | Admitting: Anesthesiology

## 2018-12-19 ENCOUNTER — Encounter (HOSPITAL_COMMUNITY)
Admission: EM | Disposition: A | Discharge status: Skilled Nursing Facility | Payer: Self-pay | Source: Home / Self Care | Attending: Internal Medicine

## 2018-12-19 DIAGNOSIS — E099 Drug or chemical induced diabetes mellitus without complications: Secondary | ICD-10-CM

## 2018-12-19 DIAGNOSIS — Z981 Arthrodesis status: Secondary | ICD-10-CM

## 2018-12-19 DIAGNOSIS — I10 Essential (primary) hypertension: Secondary | ICD-10-CM

## 2018-12-19 HISTORY — PX: APPLICATION OF INTRAOPERATIVE CT SCAN: SHX6668

## 2018-12-19 HISTORY — PX: POSTERIOR CERVICAL FUSION/FORAMINOTOMY: SHX5038

## 2018-12-19 LAB — BASIC METABOLIC PANEL
Anion gap: 13 (ref 5–15)
BUN: 32 mg/dL — ABNORMAL HIGH (ref 8–23)
CO2: 28 mmol/L (ref 22–32)
Calcium: 9.7 mg/dL (ref 8.9–10.3)
Chloride: 86 mmol/L — ABNORMAL LOW (ref 98–111)
Creatinine, Ser: 1.26 mg/dL — ABNORMAL HIGH (ref 0.44–1.00)
GFR calc Af Amer: 48 mL/min — ABNORMAL LOW (ref >60)
GFR calc non Af Amer: 41 mL/min — ABNORMAL LOW (ref >60)
Glucose, Bld: 329 mg/dL — ABNORMAL HIGH (ref 70–99)
Potassium: 4.9 mmol/L (ref 3.5–5.1)
SODIUM: 127 mmol/L — AB (ref 135–145)

## 2018-12-19 LAB — TYPE AND SCREEN
ABO/RH(D): A POS
Antibody Screen: NEGATIVE

## 2018-12-19 LAB — GLUCOSE, CAPILLARY
GLUCOSE-CAPILLARY: 280 mg/dL — AB (ref 70–99)
GLUCOSE-CAPILLARY: 260 mg/dL — AB (ref 70–99)
Glucose-Capillary: 230 mg/dL — ABNORMAL HIGH (ref 70–99)
Glucose-Capillary: 350 mg/dL — ABNORMAL HIGH (ref 70–99)

## 2018-12-19 LAB — ABO/RH: ABO/RH(D): A POS

## 2018-12-19 SURGERY — POSTERIOR CERVICAL FUSION/FORAMINOTOMY LEVEL 1
Anesthesia: General

## 2018-12-19 MED ORDER — HYDROMORPHONE HCL 1 MG/ML IJ SOLN
0.2500 mg | INTRAMUSCULAR | Status: DC | PRN
  Administered 2018-12-19 (×4): 0.5 mg via INTRAVENOUS

## 2018-12-19 MED ORDER — FENTANYL CITRATE (PF) 250 MCG/5ML IJ SOLN
INTRAMUSCULAR | Status: AC
  Filled 2018-12-19: qty 5

## 2018-12-19 MED ORDER — OXYCODONE HCL 5 MG PO TABS
5.0000 mg | ORAL_TABLET | ORAL | Status: DC | PRN
  Administered 2018-12-19 – 2018-12-21 (×2): 5 mg via ORAL
  Filled 2018-12-19 (×3): qty 1

## 2018-12-19 MED ORDER — SENNOSIDES-DOCUSATE SODIUM 8.6-50 MG PO TABS: 2.0000 | ORAL_TABLET | Freq: Every day | ORAL | Status: DC

## 2018-12-19 MED ORDER — THROMBIN 5000 UNITS EX SOLR
CUTANEOUS | Status: AC
  Filled 2018-12-19: qty 15000

## 2018-12-19 MED ORDER — MORPHINE SULFATE (PF) 2 MG/ML IV SOLN
2.0000 mg | INTRAVENOUS | Status: DC | PRN
  Administered 2018-12-20 – 2018-12-21 (×4): 2 mg via INTRAVENOUS
  Filled 2018-12-19 (×4): qty 1

## 2018-12-19 MED ORDER — HYDROCHLOROTHIAZIDE 12.5 MG PO CAPS
12.5000 mg | ORAL_CAPSULE | Freq: Every day | ORAL | Status: DC
  Administered 2018-12-20: 12.5 mg via ORAL
  Filled 2018-12-19: qty 1

## 2018-12-19 MED ORDER — LIDOCAINE-EPINEPHRINE 1 %-1:100000 IJ SOLN
INTRAMUSCULAR | Status: AC
  Filled 2018-12-19: qty 1

## 2018-12-19 MED ORDER — HYDROCODONE-ACETAMINOPHEN 5-325 MG PO TABS
2.0000 | ORAL_TABLET | ORAL | Status: DC | PRN
  Administered 2018-12-20 – 2018-12-21 (×4): 2 via ORAL
  Administered 2018-12-22: 1 via ORAL
  Filled 2018-12-19 (×5): qty 2

## 2018-12-19 MED ORDER — VALACYCLOVIR HCL 500 MG PO TABS
1000.0000 mg | ORAL_TABLET | Freq: Every day | ORAL | Status: DC
  Administered 2018-12-19 – 2018-12-22 (×4): 1000 mg via ORAL
  Filled 2018-12-19 (×4): qty 2

## 2018-12-19 MED ORDER — ACETAMINOPHEN 10 MG/ML IV SOLN
INTRAVENOUS | Status: AC
  Filled 2018-12-19: qty 100

## 2018-12-19 MED ORDER — MENTHOL 3 MG MT LOZG: 1.0000 | LOZENGE | OROMUCOSAL | Status: DC | PRN

## 2018-12-19 MED ORDER — PROPOFOL 10 MG/ML IV BOLUS
INTRAVENOUS | Status: DC | PRN
  Administered 2018-12-19: 100 mg via INTRAVENOUS

## 2018-12-19 MED ORDER — THROMBIN 5000 UNITS EX SOLR
OROMUCOSAL | Status: DC | PRN
  Administered 2018-12-19: 10:00:00 via TOPICAL

## 2018-12-19 MED ORDER — LIDOCAINE 2% (20 MG/ML) 5 ML SYRINGE
INTRAMUSCULAR | Status: DC | PRN
  Administered 2018-12-19: 60 mg via INTRAVENOUS

## 2018-12-19 MED ORDER — SODIUM CHLORIDE 0.9% FLUSH: 3.0000 mL | Freq: Two times a day (BID) | INTRAVENOUS | Status: DC

## 2018-12-19 MED ORDER — SODIUM CHLORIDE 0.9 % IV SOLN
INTRAVENOUS | Status: DC | PRN
  Administered 2018-12-19: 50 ug/min via INTRAVENOUS

## 2018-12-19 MED ORDER — ONDANSETRON HCL 4 MG/2ML IJ SOLN
INTRAMUSCULAR | Status: DC | PRN
  Administered 2018-12-19: 4 mg via INTRAVENOUS

## 2018-12-19 MED ORDER — OXYCODONE-ACETAMINOPHEN 2.5-325 MG PO TABS: 1.0000 | ORAL_TABLET | Freq: Three times a day (TID) | ORAL | Status: DC | PRN

## 2018-12-19 MED ORDER — VANCOMYCIN HCL 10 G IV SOLR
1250.0000 mg | INTRAVENOUS | Status: DC
  Administered 2018-12-19 – 2018-12-21 (×3): 1250 mg via INTRAVENOUS
  Filled 2018-12-19 (×4): qty 1250

## 2018-12-19 MED ORDER — FLEET ENEMA 7-19 GM/118ML RE ENEM: 1.0000 | ENEMA | Freq: Once | RECTAL | Status: DC | PRN

## 2018-12-19 MED ORDER — VANCOMYCIN HCL IN DEXTROSE 1-5 GM/200ML-% IV SOLN
1000.0000 mg | INTRAVENOUS | Status: AC
  Administered 2018-12-19: 1000 mg via INTRAVENOUS
  Filled 2018-12-19: qty 200

## 2018-12-19 MED ORDER — CHLORHEXIDINE GLUCONATE CLOTH 2 % EX PADS: 6.0000 | MEDICATED_PAD | Freq: Once | CUTANEOUS | Status: DC

## 2018-12-19 MED ORDER — HYDRALAZINE HCL 20 MG/ML IJ SOLN
INTRAMUSCULAR | Status: AC
  Filled 2018-12-19: qty 1

## 2018-12-19 MED ORDER — BUPIVACAINE HCL (PF) 0.5 % IJ SOLN
INTRAMUSCULAR | Status: AC
  Filled 2018-12-19: qty 30

## 2018-12-19 MED ORDER — PROMETHAZINE HCL 25 MG/ML IJ SOLN: 6.2500 mg | INTRAMUSCULAR | Status: DC | PRN

## 2018-12-19 MED ORDER — HYDROMORPHONE HCL 1 MG/ML IJ SOLN
INTRAMUSCULAR | Status: AC
  Administered 2018-12-19: 0.5 mg via INTRAVENOUS
  Filled 2018-12-19: qty 1

## 2018-12-19 MED ORDER — ONDANSETRON HCL 4 MG/2ML IJ SOLN
INTRAMUSCULAR | Status: AC
  Filled 2018-12-19: qty 2

## 2018-12-19 MED ORDER — DOCUSATE SODIUM 100 MG PO CAPS
100.0000 mg | ORAL_CAPSULE | Freq: Two times a day (BID) | ORAL | Status: DC
  Administered 2018-12-19: 100 mg via ORAL
  Filled 2018-12-19: qty 1

## 2018-12-19 MED ORDER — BUPIVACAINE HCL (PF) 0.5 % IJ SOLN
INTRAMUSCULAR | Status: DC | PRN
  Administered 2018-12-19: 10 mL

## 2018-12-19 MED ORDER — ONDANSETRON HCL 4 MG PO TABS: 4.0000 mg | ORAL_TABLET | Freq: Four times a day (QID) | ORAL | Status: DC | PRN

## 2018-12-19 MED ORDER — BISACODYL 10 MG RE SUPP: 10.0000 mg | Freq: Every day | RECTAL | Status: DC | PRN

## 2018-12-19 MED ORDER — ALUM & MAG HYDROXIDE-SIMETH 200-200-20 MG/5ML PO SUSP: 30.0000 mL | Freq: Four times a day (QID) | ORAL | Status: DC | PRN

## 2018-12-19 MED ORDER — ROCURONIUM BROMIDE 50 MG/5ML IV SOSY
PREFILLED_SYRINGE | INTRAVENOUS | Status: AC
  Filled 2018-12-19: qty 10

## 2018-12-19 MED ORDER — DOUBLE ANTIBIOTIC 500-10000 UNIT/GM EX OINT
TOPICAL_OINTMENT | CUTANEOUS | Status: AC
  Filled 2018-12-19: qty 1

## 2018-12-19 MED ORDER — PHENOL 1.4 % MT LIQD: 1.0000 | OROMUCOSAL | Status: DC | PRN

## 2018-12-19 MED ORDER — LACTATED RINGERS IV SOLN
INTRAVENOUS | Status: DC | PRN
  Administered 2018-12-19 (×2): via INTRAVENOUS

## 2018-12-19 MED ORDER — SODIUM FLUORIDE 1.1 % DT PSTE: 1.0000 "application " | PASTE | Freq: Every day | DENTAL | Status: DC

## 2018-12-19 MED ORDER — DEXMEDETOMIDINE HCL 200 MCG/2ML IV SOLN
INTRAVENOUS | Status: DC | PRN
  Administered 2018-12-19 (×2): 4 ug via INTRAVENOUS

## 2018-12-19 MED ORDER — ZOLPIDEM TARTRATE 5 MG PO TABS: 5.0000 mg | ORAL_TABLET | Freq: Every evening | ORAL | Status: DC | PRN

## 2018-12-19 MED ORDER — LIDOCAINE 2% (20 MG/ML) 5 ML SYRINGE
INTRAMUSCULAR | Status: AC
  Filled 2018-12-19: qty 5

## 2018-12-19 MED ORDER — POLYETHYLENE GLYCOL 3350 17 G PO PACK: 17.0000 g | PACK | Freq: Every day | ORAL | Status: DC | PRN

## 2018-12-19 MED ORDER — ONDANSETRON HCL 4 MG/2ML IJ SOLN: 4.0000 mg | Freq: Four times a day (QID) | INTRAMUSCULAR | Status: DC | PRN

## 2018-12-19 MED ORDER — ACETAMINOPHEN 10 MG/ML IV SOLN
INTRAVENOUS | Status: DC | PRN
  Administered 2018-12-19: 1000 mg via INTRAVENOUS

## 2018-12-19 MED ORDER — HYDRALAZINE HCL 20 MG/ML IJ SOLN
5.0000 mg | Freq: Once | INTRAMUSCULAR | Status: AC
  Administered 2018-12-19: 5 mg via INTRAVENOUS

## 2018-12-19 MED ORDER — METHOCARBAMOL 1000 MG/10ML IJ SOLN
500.0000 mg | Freq: Four times a day (QID) | INTRAVENOUS | Status: DC | PRN
  Filled 2018-12-19: qty 5

## 2018-12-19 MED ORDER — BACITRACIN ZINC 500 UNIT/GM EX OINT
TOPICAL_OINTMENT | CUTANEOUS | Status: DC | PRN
  Administered 2018-12-19: 1 via TOPICAL

## 2018-12-19 MED ORDER — KETOROLAC TROMETHAMINE 15 MG/ML IJ SOLN
15.0000 mg | Freq: Four times a day (QID) | INTRAMUSCULAR | Status: AC
  Administered 2018-12-19 – 2018-12-20 (×5): 15 mg via INTRAVENOUS
  Filled 2018-12-19 (×3): qty 1

## 2018-12-19 MED ORDER — CHLORHEXIDINE GLUCONATE CLOTH 2 % EX PADS
6.0000 | MEDICATED_PAD | Freq: Once | CUTANEOUS | Status: AC
  Administered 2018-12-19: 6 via TOPICAL

## 2018-12-19 MED ORDER — POLYETHYLENE GLYCOL 3350 17 G PO PACK
17.0000 g | PACK | Freq: Every day | ORAL | Status: DC
  Administered 2018-12-19: 17 g via ORAL
  Filled 2018-12-19: qty 1

## 2018-12-19 MED ORDER — SODIUM CHLORIDE 0.9 % IV SOLN: 250.0000 mL | INTRAVENOUS | Status: DC

## 2018-12-19 MED ORDER — PROPOFOL 10 MG/ML IV BOLUS
INTRAVENOUS | Status: AC
  Filled 2018-12-19: qty 20

## 2018-12-19 MED ORDER — DEXAMETHASONE SODIUM PHOSPHATE 10 MG/ML IJ SOLN
INTRAMUSCULAR | Status: AC
  Filled 2018-12-19: qty 1

## 2018-12-19 MED ORDER — METHOCARBAMOL 500 MG PO TABS
500.0000 mg | ORAL_TABLET | Freq: Four times a day (QID) | ORAL | Status: DC | PRN
  Administered 2018-12-20 – 2018-12-21 (×2): 500 mg via ORAL
  Filled 2018-12-19 (×2): qty 1

## 2018-12-19 MED ORDER — ACETAMINOPHEN 325 MG PO TABS: 650.0000 mg | ORAL_TABLET | ORAL | Status: DC | PRN

## 2018-12-19 MED ORDER — SODIUM CHLORIDE 0.9% FLUSH: 3.0000 mL | INTRAVENOUS | Status: DC | PRN

## 2018-12-19 MED ORDER — ACETAMINOPHEN 650 MG RE SUPP: 650.0000 mg | RECTAL | Status: DC | PRN

## 2018-12-19 MED ORDER — FENTANYL CITRATE (PF) 100 MCG/2ML IJ SOLN
INTRAMUSCULAR | Status: DC | PRN
  Administered 2018-12-19: 50 ug via INTRAVENOUS
  Administered 2018-12-19: 100 ug via INTRAVENOUS
  Administered 2018-12-19 (×2): 50 ug via INTRAVENOUS

## 2018-12-19 MED ORDER — KCL IN DEXTROSE-NACL 20-5-0.45 MEQ/L-%-% IV SOLN
INTRAVENOUS | Status: DC
  Administered 2018-12-19: 16:00:00 via INTRAVENOUS
  Filled 2018-12-19: qty 1000

## 2018-12-19 MED ORDER — DEXAMETHASONE SODIUM PHOSPHATE 4 MG/ML IJ SOLN
4.0000 mg | Freq: Four times a day (QID) | INTRAMUSCULAR | Status: DC
  Filled 2018-12-19 (×2): qty 1

## 2018-12-19 MED ORDER — PANTOPRAZOLE SODIUM 40 MG IV SOLR
40.0000 mg | Freq: Every day | INTRAVENOUS | Status: DC
  Administered 2018-12-19 – 2018-12-21 (×3): 40 mg via INTRAVENOUS
  Filled 2018-12-19 (×3): qty 40

## 2018-12-19 MED ORDER — 0.9 % SODIUM CHLORIDE (POUR BTL) OPTIME
TOPICAL | Status: DC | PRN
  Administered 2018-12-19: 1000 mL

## 2018-12-19 MED ORDER — DEXAMETHASONE 4 MG PO TABS
4.0000 mg | ORAL_TABLET | Freq: Four times a day (QID) | ORAL | Status: DC
  Administered 2018-12-19: 4 mg via ORAL
  Filled 2018-12-19: qty 1

## 2018-12-19 MED ORDER — EPINEPHRINE 0.3 MG/0.3ML IJ SOAJ: 0.3000 mg | Freq: Once | INTRAMUSCULAR | Status: DC

## 2018-12-19 MED ORDER — LIDOCAINE-EPINEPHRINE 1 %-1:100000 IJ SOLN
INTRAMUSCULAR | Status: DC | PRN
  Administered 2018-12-19: 10 mL

## 2018-12-19 MED ORDER — ROCURONIUM BROMIDE 50 MG/5ML IV SOSY
PREFILLED_SYRINGE | INTRAVENOUS | Status: DC | PRN
  Administered 2018-12-19: 20 mg via INTRAVENOUS
  Administered 2018-12-19: 5 mg via INTRAVENOUS
  Administered 2018-12-19: 50 mg via INTRAVENOUS
  Administered 2018-12-19: 10 mg via INTRAVENOUS

## 2018-12-19 SURGICAL SUPPLY — 77 items
ADH SKN CLS APL DERMABOND .7 (GAUZE/BANDAGES/DRESSINGS) ×1
BASKET BONE COLLECTION (BASKET) ×2 IMPLANT
BIT DRILL NEURO 2X3.1 SFT TUCH (MISCELLANEOUS) IMPLANT
BLADE CLIPPER SURG (BLADE) ×2 IMPLANT
BLADE SURG 11 STRL SS (BLADE) IMPLANT
BLADE ULTRA TIP 2M (BLADE) IMPLANT
BUR PRECISION FLUTE 5.0 (BURR) ×2 IMPLANT
CANISTER SUCT 3000ML PPV (MISCELLANEOUS) ×3 IMPLANT
CARTRIDGE OIL MAESTRO DRILL (MISCELLANEOUS) ×2 IMPLANT
COVER BACK TABLE 60X90IN (DRAPES) ×4 IMPLANT
COVER WAND RF STERILE (DRAPES) ×4 IMPLANT
DECANTER SPIKE VIAL GLASS SM (MISCELLANEOUS) ×3 IMPLANT
DERMABOND ADVANCED (GAUZE/BANDAGES/DRESSINGS) ×2
DERMABOND ADVANCED .7 DNX12 (GAUZE/BANDAGES/DRESSINGS) IMPLANT
DIFFUSER DRILL AIR PNEUMATIC (MISCELLANEOUS) ×4 IMPLANT
DRAPE C-ARM 42X72 X-RAY (DRAPES) ×4 IMPLANT
DRAPE LAPAROTOMY 100X72 PEDS (DRAPES) ×3 IMPLANT
DRAPE MICROSCOPE LEICA (MISCELLANEOUS) IMPLANT
DRAPE SCAN PATIENT (DRAPES) ×3 IMPLANT
DRILL NEURO 2X3.1 SOFT TOUCH (MISCELLANEOUS) ×3
DRSG OPSITE POSTOP 4X8 (GAUZE/BANDAGES/DRESSINGS) ×2 IMPLANT
DRSG PAD ABDOMINAL 8X10 ST (GAUZE/BANDAGES/DRESSINGS) IMPLANT
DURAPREP 6ML APPLICATOR 50/CS (WOUND CARE) ×3 IMPLANT
ELECT REM PT RETURN 9FT ADLT (ELECTROSURGICAL) ×3
ELECTRODE REM PT RTRN 9FT ADLT (ELECTROSURGICAL) ×1 IMPLANT
EVACUATOR 1/8 PVC DRAIN (DRAIN) ×2 IMPLANT
GAUZE 4X4 16PLY RFD (DISPOSABLE) IMPLANT
GAUZE SPONGE 4X4 12PLY STRL (GAUZE/BANDAGES/DRESSINGS) ×1 IMPLANT
GLOVE BIO SURGEON STRL SZ8 (GLOVE) ×5 IMPLANT
GLOVE BIOGEL PI IND STRL 8 (GLOVE) ×1 IMPLANT
GLOVE BIOGEL PI IND STRL 8.5 (GLOVE) ×1 IMPLANT
GLOVE BIOGEL PI INDICATOR 8 (GLOVE) ×4
GLOVE BIOGEL PI INDICATOR 8.5 (GLOVE) ×4
GLOVE ECLIPSE 8.0 STRL XLNG CF (GLOVE) ×5 IMPLANT
GLOVE EXAM NITRILE XL STR (GLOVE) IMPLANT
GOWN STRL REUS W/ TWL LRG LVL3 (GOWN DISPOSABLE) IMPLANT
GOWN STRL REUS W/ TWL XL LVL3 (GOWN DISPOSABLE) IMPLANT
GOWN STRL REUS W/TWL 2XL LVL3 (GOWN DISPOSABLE) ×3 IMPLANT
GOWN STRL REUS W/TWL LRG LVL3 (GOWN DISPOSABLE)
GOWN STRL REUS W/TWL XL LVL3 (GOWN DISPOSABLE)
HEMOSTAT POWDER KIT SURGIFOAM (HEMOSTASIS) ×2 IMPLANT
HEMOSTAT SURGICEL 2X14 (HEMOSTASIS) IMPLANT
KIT BASIN OR (CUSTOM PROCEDURE TRAY) ×3 IMPLANT
KIT TURNOVER KIT B (KITS) ×3 IMPLANT
MARKER SKIN DUAL TIP RULER LAB (MISCELLANEOUS) ×3 IMPLANT
MARKER SPHERE PSV REFLC 13MM (MARKER) ×17 IMPLANT
NDL HYPO 18GX1.5 BLUNT FILL (NEEDLE) IMPLANT
NDL HYPO 25X1 1.5 SAFETY (NEEDLE) ×1 IMPLANT
NDL SPNL 22GX3.5 QUINCKE BK (NEEDLE) ×1 IMPLANT
NEEDLE HYPO 18GX1.5 BLUNT FILL (NEEDLE) IMPLANT
NEEDLE HYPO 25X1 1.5 SAFETY (NEEDLE) ×3 IMPLANT
NEEDLE SPNL 22GX3.5 QUINCKE BK (NEEDLE) ×3 IMPLANT
NS IRRIG 1000ML POUR BTL (IV SOLUTION) ×3 IMPLANT
OIL CARTRIDGE MAESTRO DRILL (MISCELLANEOUS) ×3
PACK LAMINECTOMY NEURO (CUSTOM PROCEDURE TRAY) ×3 IMPLANT
PIN MAYFIELD SKULL DISP (PIN) ×1 IMPLANT
PIN SKULL DORO LUCENT (PIN) IMPLANT
PINS SKULL DORO LUCENT (PIN) ×3
ROD VUEPOINT 3.5X60 (Rod) ×2 IMPLANT
RUBBERBAND STERILE (MISCELLANEOUS) IMPLANT
SCREW MA MM 3.5X12 (Screw) ×4 IMPLANT
SCREW SET THREADED (Screw) ×10 IMPLANT
SCREW VUEPOINT II 4.5X26MM MA (Screw) ×4 IMPLANT
SPONGE INTESTINAL PEANUT (DISPOSABLE) ×2 IMPLANT
SPONGE SURGIFOAM ABS GEL SZ50 (HEMOSTASIS) ×1 IMPLANT
STAPLER SKIN PROX WIDE 3.9 (STAPLE) ×3 IMPLANT
SUT ETHILON 3 0 FSL (SUTURE) ×3 IMPLANT
SUT VIC AB 0 CT1 18XCR BRD8 (SUTURE) ×1 IMPLANT
SUT VIC AB 0 CT1 8-18 (SUTURE) ×3
SUT VIC AB 2-0 CP2 18 (SUTURE) ×3 IMPLANT
SUT VIC AB 3-0 SH 8-18 (SUTURE) ×2 IMPLANT
SYR 3ML LL SCALE MARK (SYRINGE) IMPLANT
TOWEL GREEN STERILE (TOWEL DISPOSABLE) ×3 IMPLANT
TOWEL GREEN STERILE FF (TOWEL DISPOSABLE) ×3 IMPLANT
TRAY FOLEY MTR SLVR 16FR STAT (SET/KITS/TRAYS/PACK) ×2 IMPLANT
UNDERPAD 30X30 (UNDERPADS AND DIAPERS) ×3 IMPLANT
WATER STERILE IRR 1000ML POUR (IV SOLUTION) ×3 IMPLANT

## 2018-12-19 NOTE — Anesthesia Postprocedure Evaluation (Signed)
Anesthesia Post Note  Patient: [name] [name]  Procedure(s) Performed: Cervical Seven to Thoracic One Posterior cervical fusion (N/A ) APPLICATION OF INTRAOPERATIVE CT SCAN (N/A )     Patient location during evaluation: PACU Anesthesia Type: General Level of consciousness: awake and alert Pain management: pain level controlled Vital Signs Assessment: post-procedure vital signs reviewed and stable Respiratory status: spontaneous breathing, nonlabored ventilation, respiratory function stable and patient connected to nasal cannula oxygen Cardiovascular status: blood pressure returned to baseline and stable Postop Assessment: no apparent nausea or vomiting Anesthetic complications: no    Last Vitals:  Vitals:   12/19/18 1258 12/19/18 1313  BP: (!) 177/130 (!) 175/92  Pulse: 65 69  Resp: 19 (!) 23  Temp:    SpO2: 96% 94%    Last Pain:  Vitals:   12/19/18 1317  TempSrc:   PainSc: 6                  [name]

## 2018-12-19 NOTE — Anesthesia Preprocedure Evaluation (Addendum)
Anesthesia Evaluation  Patient identified by MRN, date of birth, ID band Patient awake    Reviewed: Allergy & Precautions, NPO status , Patient's Chart, lab work & pertinent test results  Airway Mallampati: II  TM Distance: >3 FB Neck ROM: Limited    Dental no notable dental hx. (+) Dental Advidsory Given, Teeth Intact   Pulmonary neg pulmonary ROS,    Pulmonary exam normal breath sounds clear to auscultation       Cardiovascular hypertension, Normal cardiovascular exam Rhythm:Regular Rate:Normal     Neuro/Psych negative neurological ROS  negative psych ROS   GI/Hepatic Neg liver ROS, GERD  Medicated and Controlled,  Endo/Other  diabetes  Renal/GU negative Renal ROS  negative genitourinary   Musculoskeletal negative musculoskeletal ROS (+)   Abdominal   Peds negative pediatric ROS (+)  Hematology negative hematology ROS (+)   Anesthesia Other Findings   Reproductive/Obstetrics negative OB ROS                            Anesthesia Physical Anesthesia Plan  ASA: III  Anesthesia Plan: General   Post-op Pain Management:    Induction: Intravenous  PONV Risk Score and Plan: 3 and Ondansetron, Dexamethasone and Treatment may vary due to age or medical condition  Airway Management Planned: Oral ETT  Additional Equipment:   Intra-op Plan:   Post-operative Plan: Extubation in OR  Informed Consent: I have reviewed the patients History and Physical, chart, labs and discussed the procedure including the risks, benefits and alternatives for the proposed anesthesia with the patient or authorized representative who has indicated his/her understanding and acceptance.   Dental advisory given and Dental Advisory Given  Plan Discussed with: CRNA and Surgeon  Anesthesia Plan Comments:        Anesthesia Quick Evaluation

## 2018-12-19 NOTE — Anesthesia Procedure Notes (Signed)
Procedure Name: Intubation Date/Time: 12/19/2018 9:14 AM Performed by: Neldon Newport, CRNA Pre-anesthesia Checklist: Timeout performed, Patient being monitored, Suction available, Emergency Drugs available and Patient identified Patient Re-evaluated:Patient Re-evaluated prior to induction Oxygen Delivery Method: Circle system utilized Preoxygenation: Pre-oxygenation with 100% oxygen Induction Type: IV induction Ventilation: Mask ventilation without difficulty and Oral airway inserted - appropriate to patient size Laryngoscope Size: Mac and 3 Grade View: Grade I Tube type: Oral Tube size: 7.0 mm Number of attempts: 1 Placement Confirmation: breath sounds checked- equal and bilateral,  positive ETCO2 and ETT inserted through vocal cords under direct vision Secured at: 22 cm Tube secured with: Tape Dental Injury: Teeth and Oropharynx as per pre-operative assessment

## 2018-12-19 NOTE — Interval H&P Note (Signed)
History and Physical Interval Note:  12/19/2018 8:30 AM  [name] [name]  has presented today for surgery, with the diagnosis of Cervical instability, Cord Compression  The various methods of treatment have been discussed with the patient and family. After consideration of risks, benefits and other options for treatment, the patient has consented to  Procedure(s) with comments: Cervical 7 to Thoracic 1 Posterior cervical fusion (N/A) - Cervical 7 to Thoracic 1 Posterior cervical fusion APPLICATION OF INTRAOPERATIVE CT SCAN (N/A) as a surgical intervention .  The patient's history has been reviewed, patient examined, no change in status, stable for surgery.  I have reviewed the patient's chart and labs.  Questions were answered to the patient's satisfaction.     Dorian Heckle

## 2018-12-19 NOTE — Transfer of Care (Signed)
Immediate Anesthesia Transfer of Care Note  Patient: [name] [name]  Procedure(s) Performed: Cervical Seven to Thoracic One Posterior cervical fusion (N/A ) APPLICATION OF INTRAOPERATIVE CT SCAN (N/A )  Patient Location: PACU  Anesthesia Type:General  Level of Consciousness: sedated  Airway & Oxygen Therapy: Patient Spontanous Breathing and Patient connected to nasal cannula oxygen  Post-op Assessment: Report given to RN, Post -op Vital signs reviewed and stable and Patient moving all extremities X 4  Post vital signs: Reviewed and stable  Last Vitals:  Vitals Value Taken Time  BP    Temp 36.5 C 12/19/2018 12:14 PM  Pulse 66 12/19/2018 12:14 PM  Resp 20 12/19/2018 12:14 PM  SpO2 100 % 12/19/2018 12:14 PM  Vitals shown include unvalidated device data.  Last Pain:  Vitals:   12/19/18 0442  TempSrc: Oral  PainSc:       Patients Stated Pain Goal: 0 (12/18/18 1638)  Complications: No apparent anesthesia complications

## 2018-12-19 NOTE — Brief Op Note (Signed)
12/19/2018  12:00 PM  PATIENT:  [name] [name]  77 y.o. 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   PROCEDURE:  Procedure(s) with comments: Cervical Seven to Thoracic One Posterior cervical fusion (N/A) - Cervical Seven to Thoracic One Posterior cervical fusion APPLICATION OF INTRAOPERATIVE CT SCAN (N/A) with T 1 pedicle screws and C 7 lateral mass fixation with c & - T 1 decompression and posterolateral arthrodesis with local autograft  SURGEON:  Surgeon(s) and Role:    * Jamone Garrido, MD - Primary    * Cram, Gary, MD - Assisting  PHYSICIAN ASSISTANT:   ASSISTANTS: none   ANESTHESIA:   general  EBL:  250 mL   BLOOD ADMINISTERED:none  DRAINS: (Medium) Hemovact drain(s) in the epidural space with  Suction Open   LOCAL MEDICATIONS USED:  MARCAINE    and LIDOCAINE   SPECIMEN:  No Specimen  DISPOSITION OF SPECIMEN:  N/A  COUNTS:  YES  TOURNIQUET:  * No tourniquets in log *  DICTATION: DICTATION:   Indications:  Patient is a 77 year old woman with multiple congenital vertebral anomalies (fusion C 2 and C 3, block vertebrae T 1 - T 3, surgical fusion C 4 and C 5 with autofusion  C 6 and C 7) with spondylolisthesis and cord compression C 7 on T 1.  .  It was elected to take her to surgery for posterior decompression and instrumented fusion C 7 - T 1 levels  with instrumentation and with posterolateral arthrodesis with intraoperative CT with Airo.  Procedure:  After the smooth and uncomplicated induction of general endotracheal anesthesia, patient was placed in radioluscent headholder and placed prone on Airo bed.   Scalp was shaved.   After prep and drape with betadine scrub and Dura prep, area of incision was  infiltrated with lidocaine.  Incision was made exposing C 6, C 7, to T 3 and Airo array was placed with intraoperative CT.  Using navigation, 4.5 x 26 mm pedicle screws were placed at T 1 bilaterally. Lateral mass screws were placed at C 7 bilaterally according to standard landmarks (3.5 x 12 mm).  Rods were cut and bent, then affixed to the screw heads.Cervico-thoracic decompression was performed with laminectomy of C 7 - T 1 levels with decompression of the spinal cord dura and removal of infolded ligaments.  Posterolateral region was decorticated and packed with local autograft bilaterally.  A medium Hemovac drain was placed.  Hemostasis was assured, then wounds were irrigated, and closed with interrupted 0, 2-0 and 3-0 vicryl sutures to reapproximate the skin edges.  Sterile occlusive dressing was placed and patient was removed from pins and returned to the OR stretcher.  Counts were correct at the end of the case.  PLAN OF CARE: Admit to inpatient   PATIENT DISPOSITION:  PACU - hemodynamically stable.   Delay start of Pharmacological VTE agent (>24hrs) due to surgical blood loss or risk of bleeding: yes  

## 2018-12-19 NOTE — Op Note (Signed)
12/19/2018  12:00 PM  PATIENT:  [name] [name]  78 y.o. 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   PROCEDURE:  Procedure(s) with comments: Cervical Seven to Thoracic One Posterior cervical fusion (N/A) - Cervical Seven to Thoracic One Posterior cervical fusion APPLICATION OF INTRAOPERATIVE CT SCAN (N/A) with T 1 pedicle screws and C 7 lateral mass fixation with c & - T 1 decompression and posterolateral arthrodesis with local autograft  SURGEON:  Surgeon(s) and Role:    Maeola Harman, MD - Primary    * Donalee Citrin, MD - Assisting  PHYSICIAN ASSISTANT:   ASSISTANTS: none   ANESTHESIA:   general  EBL:  250 mL   BLOOD ADMINISTERED:none  DRAINS: (Medium) Hemovact drain(s) in the epidural space with  Suction Open   LOCAL MEDICATIONS USED:  MARCAINE    and LIDOCAINE   SPECIMEN:  No Specimen  DISPOSITION OF SPECIMEN:  N/A  COUNTS:  YES  TOURNIQUET:  * No tourniquets in log *  DICTATION: DICTATION:   Indications:  Patient is a 78 year old woman with multiple congenital vertebral anomalies (fusion C 2 and C 3, block vertebrae T 1 - T 3, surgical fusion C 4 and C 5 with autofusion  C 6 and C 7) with spondylolisthesis and cord compression C 7 on T 1.  .  It was elected to take her to surgery for posterior decompression and instrumented fusion C 7 - T 1 levels  with instrumentation and with posterolateral arthrodesis with intraoperative CT with Airo.  Procedure:  After the smooth and uncomplicated induction of general endotracheal anesthesia, patient was placed in radioluscent headholder and placed prone on Airo bed.   Scalp was shaved.   After prep and drape with betadine scrub and Dura prep, area of incision was  infiltrated with lidocaine.  Incision was made exposing C 6, C 7, to T 3 and Airo array was placed with intraoperative CT.  Using navigation, 4.5 x 26 mm pedicle screws were placed at T 1 bilaterally. Lateral mass screws were placed at C 7 bilaterally according to standard landmarks (3.5 x 12 mm).  Rods were cut and bent, then affixed to the screw heads.Cervico-thoracic decompression was performed with laminectomy of C 7 - T 1 levels with decompression of the spinal cord dura and removal of infolded ligaments.  Posterolateral region was decorticated and packed with local autograft bilaterally.  A medium Hemovac drain was placed.  Hemostasis was assured, then wounds were irrigated, and closed with interrupted 0, 2-0 and 3-0 vicryl sutures to reapproximate the skin edges.  Sterile occlusive dressing was placed and patient was removed from pins and returned to the OR stretcher.  Counts were correct at the end of the case.  PLAN OF CARE: Admit to inpatient   PATIENT DISPOSITION:  PACU - hemodynamically stable.   Delay start of Pharmacological VTE agent (>24hrs) due to surgical blood loss or risk of bleeding: yes

## 2018-12-19 NOTE — Progress Notes (Signed)
   Subjective: Patient seen after surgical procedure this afternoon. She seemed somnolent but easily arousable to voice. Her only complained was back pain.  Objective:  Vital signs in last 24 hours: Vitals:   12/19/18 1343 12/19/18 1358 12/19/18 1409 12/19/18 1448  BP: (!) 189/78 (!) 160/73  (!) 155/74  Pulse: 63   78  Resp: (!) 23   18  Temp:   97.7 F (36.5 C) 98.7 F (37.1 C)  TempSrc:    Oral  SpO2: 95%   97%  Weight:      Height:       General: elderly female, in pain but in no acute distress  Pulm: appeared comfortable in room air  Neuro: moving all extremities spontaneously and purposefully, 5/5 strength in bilateral LE   Assessment/Plan:  Principal Problem:   Cord compression (HCC) Active Problems:   Non-insulin dependent type 2 diabetes mellitus (HCC)  Ms. Michels has multilevel lumbar degenerative disease and was admitted for increasing weakness with significant myelopathy with cervical/thoracic instability with cord compression andedema.   Myelopathy with cervical/thoracic instability with cord compressionand edema: S/p C7-T1 today. No complications during procedure. Complaining of back pain but overall doing well.  - Continue Decadron 4 mg every 6 hours - Pain regimen per neurosurgery, added Toradol  - PT/OTrecommending SNF at discharge, SW consulted. Daughter agrees with SNF.  NeuropathicLE pain:  - Pain regimen per neurosurgery   Steroid-induced DM and HTN: CBG remain high in the 200-300s. BP remains elevated as well with sBP 150-160s.  - Lantus to 16 units QD  - Novolog to 5 units TID with meals.  - Continue SSI resistant - Continue HCTZ 12.5 mg   VVK:PQAE  Diet: HH/CM  IVFs: D5 1/2NS Code:Full code  Dispo: Anticipated dischargepending surgical outcomes.   Burna Cash, MD 12/19/2018, 3:48 PM Pager: [phone]

## 2018-12-19 NOTE — Progress Notes (Signed)
Pharmacy Antibiotic Note  [name] [name] is a 78 y.o. female admitted on 12/14/2018 with surgical prophylaxis.  Pharmacy has been consulted for vancomycin dosing.  Underwent cervical fusion on 1/8, with drain left in place. Received vancomycin 1g IV on 1/8@0757 . Scr up slightly to 1.26 today (CrCl ~36 mL/min). Afebrile.  Plan: Vancomycin 1250 mg IV every 24 hours starting 12 hours from last vancomycin dose  F/u clinical pic, renal fx, and duration of therapy  Height: 5' (152.4 cm) Weight: 191 lb 2.2 oz (86.7 kg) IBW/kg (Calculated) : 45.5  Temp (24hrs), Avg:97.8 F (36.6 C), Min:97.4 F (36.3 C), Max:98.2 F (36.8 C)  Recent Labs  Lab 12/14/18 1850 12/14/18 1907 12/15/18 0356 12/16/18 0558 12/17/18 0408 12/18/18 0246 12/19/18 0148  WBC 10.2  --  9.9  --   --   --   --   CREATININE 0.97 0.90 1.06* 1.25* 0.96 1.07* 1.26*  LATICACIDVEN  --  2.53*  --   --   --   --   --     Estimated Creatinine Clearance: 36.6 mL/min (A) (by C-G formula based on SCr of 1.26 mg/dL (H)).    Allergies  Allergen Reactions  . Penicillins Nausea Only    Has patient had a PCN reaction causing immediate rash, facial/tongue/throat swelling, SOB or lightheadedness with hypotension: Yes Has patient had a PCN reaction causing severe rash involving mucus membranes or skin necrosis: No Has patient had a PCN reaction that required hospitalization No Has patient had a PCN reaction occurring within the last 10 years: No If all of the above answers are "NO", then may proceed with Cephalosporin use.   . Sulfa Antibiotics Nausea And Vomiting    Antimicrobials this admission: Vancomycin 1/8 >>   Dose adjustments this admission: N/A  Microbiology results: 1/4 UCx: >100 E Coli (pan-sen)  1/4 MRSA PCR: neg  Thank you for allowing pharmacy to be a part of this patient's care.  Sherron Monday, PharmD, BCCCP Clinical Pharmacist  Pager: [phone] Phone: [phone] 12/19/2018 2:38 PM

## 2018-12-19 NOTE — Progress Notes (Signed)
Report called in to [facility] at ext. [phone].

## 2018-12-20 ENCOUNTER — Encounter (HOSPITAL_COMMUNITY): Payer: Self-pay | Admitting: Neurosurgery

## 2018-12-20 DIAGNOSIS — Z978 Presence of other specified devices: Secondary | ICD-10-CM

## 2018-12-20 DIAGNOSIS — N179 Acute kidney failure, unspecified: Secondary | ICD-10-CM

## 2018-12-20 LAB — BASIC METABOLIC PANEL
Anion gap: 12 (ref 5–15)
BUN: 30 mg/dL — ABNORMAL HIGH (ref 8–23)
CO2: 23 mmol/L (ref 22–32)
Calcium: 8.5 mg/dL — ABNORMAL LOW (ref 8.9–10.3)
Chloride: 95 mmol/L — ABNORMAL LOW (ref 98–111)
Creatinine, Ser: 1.06 mg/dL — ABNORMAL HIGH (ref 0.44–1.00)
GFR calc Af Amer: 59 mL/min — ABNORMAL LOW (ref >60)
GFR calc non Af Amer: 51 mL/min — ABNORMAL LOW (ref >60)
Glucose, Bld: 224 mg/dL — ABNORMAL HIGH (ref 70–99)
POTASSIUM: 3.9 mmol/L (ref 3.5–5.1)
Sodium: 130 mmol/L — ABNORMAL LOW (ref 135–145)

## 2018-12-20 LAB — CBC
HEMATOCRIT: 36.3 % (ref 36.0–46.0)
HEMOGLOBIN: 11.9 g/dL — AB (ref 12.0–15.0)
MCH: 29 pg (ref 26.0–34.0)
MCHC: 32.8 g/dL (ref 30.0–36.0)
MCV: 88.3 fL (ref 80.0–100.0)
Platelets: 203 10*3/uL (ref 150–400)
RBC: 4.11 MIL/uL (ref 3.87–5.11)
RDW: 13.4 % (ref 11.5–15.5)
WBC: 21.3 10*3/uL — ABNORMAL HIGH (ref 4.0–10.5)
nRBC: 0 % (ref 0.0–0.2)

## 2018-12-20 LAB — GLUCOSE, CAPILLARY
Glucose-Capillary: 149 mg/dL — ABNORMAL HIGH (ref 70–99)
Glucose-Capillary: 164 mg/dL — ABNORMAL HIGH (ref 70–99)
Glucose-Capillary: 169 mg/dL — ABNORMAL HIGH (ref 70–99)
Glucose-Capillary: 179 mg/dL — ABNORMAL HIGH (ref 70–99)
Glucose-Capillary: 182 mg/dL — ABNORMAL HIGH (ref 70–99)
Glucose-Capillary: 194 mg/dL — ABNORMAL HIGH (ref 70–99)
Glucose-Capillary: 218 mg/dL — ABNORMAL HIGH (ref 70–99)

## 2018-12-20 MED ORDER — HYDROXYZINE HCL 10 MG PO TABS
10.0000 mg | ORAL_TABLET | Freq: Every day | ORAL | Status: DC | PRN
  Filled 2018-12-20: qty 1

## 2018-12-20 MED ORDER — INSULIN ASPART 100 UNIT/ML ~~LOC~~ SOLN
0.0000 [IU] | SUBCUTANEOUS | Status: DC
  Administered 2018-12-20 (×2): 3 [IU] via SUBCUTANEOUS
  Administered 2018-12-20: 5 [IU] via SUBCUTANEOUS
  Administered 2018-12-21 (×2): 3 [IU] via SUBCUTANEOUS
  Administered 2018-12-21: 5 [IU] via SUBCUTANEOUS
  Administered 2018-12-21: 2 [IU] via SUBCUTANEOUS
  Administered 2018-12-21: 3 [IU] via SUBCUTANEOUS

## 2018-12-20 MED ORDER — LABETALOL HCL 100 MG PO TABS
100.0000 mg | ORAL_TABLET | Freq: Two times a day (BID) | ORAL | Status: DC
  Administered 2018-12-20 – 2018-12-22 (×5): 100 mg via ORAL
  Filled 2018-12-20 (×5): qty 1

## 2018-12-20 MED ORDER — ENOXAPARIN SODIUM 40 MG/0.4ML ~~LOC~~ SOLN
40.0000 mg | SUBCUTANEOUS | Status: DC
  Administered 2018-12-20 – 2018-12-21 (×2): 40 mg via SUBCUTANEOUS
  Filled 2018-12-20 (×2): qty 0.4

## 2018-12-20 MED ORDER — KCL IN DEXTROSE-NACL 20-5-0.45 MEQ/L-%-% IV SOLN
INTRAVENOUS | Status: AC
  Administered 2018-12-20: 17:00:00 via INTRAVENOUS
  Filled 2018-12-20: qty 1000

## 2018-12-20 MED ORDER — HEPARIN SODIUM (PORCINE) 5000 UNIT/ML IJ SOLN
5000.0000 [IU] | Freq: Three times a day (TID) | INTRAMUSCULAR | Status: DC
  Administered 2018-12-20: 5000 [IU] via SUBCUTANEOUS
  Filled 2018-12-20: qty 1

## 2018-12-20 NOTE — Progress Notes (Signed)
RN hooked hemovac back together it [place] charged and still draining will continue to monitor if anything changes

## 2018-12-20 NOTE — Progress Notes (Signed)
Subjective: Patient reports doing well.  Legs feel better.  Objective: Vital signs in last 24 hours: Temp:  [97.3 F (36.3 C)-99.5 F (37.5 C)] 97.3 F (36.3 C) (01/09 0452) Pulse Rate:  [55-100] 61 (01/09 0452) Resp:  [16-33] 20 (01/09 0452) BP: (155-218)/(64-130) 163/89 (01/09 0600) SpO2:  [88 %-100 %] 100 % (01/09 0452)  Intake/Output from previous day: 01/08 0701 - 01/09 0700 In: 2326.6 [P.O.:240; I.V.:1552.8; IV Piggyback:533.8] Out: 2470 [Urine:2050; Drains:170; Blood:250] Intake/Output this shift: No intake/output data recorded.  Physical Exam: Dressing CDI.  Strength full in both legs.  Lab Results: Recent Labs    12/20/18 0202  WBC 21.3*  HGB 11.9*  HCT 36.3  PLT 203   BMET Recent Labs    12/19/18 0148 12/20/18 0202  NA 127* 130*  K 4.9 3.9  CL 86* 95*  CO2 28 23  GLUCOSE 329* 224*  BUN 32* 30*  CREATININE 1.26* 1.06*  CALCIUM 9.7 8.5*    Studies/Results: No results found.  Assessment/Plan: Mobilize today with PT and OT.  D/C Foley.  Patient feels that legs are better and numbness is also improved.  Drain has 140 cc last shift.  Will leave in place today.  Patient is doing very well.      LOS: 5 days    Dorian Heckle, MD 12/20/2018, 7:24 AM

## 2018-12-20 NOTE — Evaluation (Signed)
Occupational Therapy Re-Evaluation Patient Details Name: [name] [name] MRN: [id number] DOB: [birth year] Today's Date: 12/20/2018    History of Present Illness 78 y.o female [facility] patient who is wheelchair bound living on her own admitted to [facility] on 12/15/[facility] after fall. PMHx of diabetes, HTN, bilateral sensorineural hearing loss, peripheral neuropathy, sciatic nerve disease, urinary incontinence.  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 Venetia Maxon on 12/19/18   Clinical Impression   Re-evaluation post-op. Pt is currently mod to min A for transfers with RW (vc for safe hand placement and assist for boost/descent), min guard for lateral leans with peri care, set up for grooming/self-feeding, max A for LB ADL - will need continued skilled OT in the acute setting and afterwards at the SNF level to maximize safety and independence in ADL and functional transfers. Next session to bring AE for LB ADL. Pt is very very very pleasant and motivated and loves to work with therapy.     Follow Up Recommendations  SNF    Equipment Recommendations  3 in 1 bedside commode;Wheelchair cushion (measurements OT);Wheelchair (measurements OT)    Recommendations for Other Services       Precautions / Restrictions Precautions Precautions: Fall;Back Precaution Booklet Issued: Yes (comment) Precaution Comments: reviewed back precautions with Pt, no brace needed per orders Restrictions Weight Bearing Restrictions: No      Mobility Bed Mobility Overal bed mobility: Needs Assistance Bed Mobility: Rolling;Sidelying to Sit Rolling: Min guard Sidelying to sit: Min assist Supine to sit: Min guard;HOB elevated   Sit to sidelying: Min guard General bed mobility comments: assist with trunk to upright, incr time needed;  vc for  sequencing/technique of log roll  Transfers Overall transfer level: Needs assistance Equipment used: Rolling walker (2 wheeled) Transfers: Sit to/from [employer] Sit to Stand: Mod assist Stand pivot transfers: Min assist       General transfer comment: multi-modal cues for hand placement, assist to rise and stabilize in standing    Balance Overall balance assessment: Needs assistance   Sitting balance-Leahy Scale: Fair(in recliner) Sitting balance - Comments: supported sitting in recliner   Standing balance support: Bilateral upper extremity supported;During functional activity Standing balance-Leahy Scale: Poor Standing balance comment: reliant on BUE support                            ADL either performed or assessed with clinical judgement   ADL Overall ADL's : Needs assistance/impaired Eating/Feeding: Set up   Grooming: Oral care;Wash/dry hands;Set up;Sitting;Supervision/safety Grooming Details (indicate cue type and reason): cues for compensatory strategies to maintain precautions post-op Upper Body Bathing: Moderate assistance;Sitting   Lower Body Bathing: Maximal assistance;Sit to/from stand   Upper Body Dressing : Minimal assistance   Lower Body Dressing: Maximal assistance;Sit to/from stand   Toilet Transfer: Moderate assistance;Stand-pivot;BSC Toilet Transfer Details (indicate cue type and reason): vc for safe hand placement, assist for boost and descent Toileting- Clothing Manipulation and Hygiene: Min guard;Sitting/lateral lean       Functional mobility during ADLs: Minimal assistance;Moderate assistance;Rolling walker(SPT only this session) General ADL Comments: Pt with decreased strength, balance, and activity tolerance. Pt very motivated' and the BEST attitude     Vision   Vision Assessment?: No apparent visual deficits     Perception     Praxis  Pertinent Vitals/Pain Pain Assessment: Faces Faces Pain Scale:  Hurts a little bit Pain Location: mid back with bed mobility Pain Descriptors / Indicators: Grimacing;Guarding;Sore Pain Intervention(s): Limited activity within patient's tolerance;Monitored during session;Repositioned     Hand Dominance Right   Extremity/Trunk Assessment Upper Extremity Assessment Upper Extremity Assessment: Overall WFL for tasks assessed;Generalized weakness   Lower Extremity Assessment Lower Extremity Assessment: Defer to PT evaluation   Cervical / Trunk Assessment Cervical / Trunk Assessment: Kyphotic;Other exceptions Cervical / Trunk Exceptions: s/p sx   Communication Communication Communication: HOH   Cognition Arousal/Alertness: Awake/alert Behavior During Therapy: WFL for tasks assessed/performed;Anxious(anxious about falling - reassured) Overall Cognitive Status: Impaired/Different from baseline Area of Impairment: Safety/judgement;Problem solving                         Safety/Judgement: Decreased awareness of safety   Problem Solving: Requires verbal cues;Requires tactile cues;Difficulty sequencing General Comments: redirection to task, decreased attention, decreased safety awareness   General Comments       Exercises     Shoulder Instructions      Home Living Family/patient expects to be discharged to:: Private residence Living Arrangements: Alone Available Help at Discharge: Family;Personal care attendant Type of Home: Apartment Home Access: Level entry     Home Layout: One level     Bathroom Shower/Tub: Producer, television/film/videoWalk-in shower   Bathroom Toilet: Handicapped height Bathroom Accessibility: Yes How Accessible: Accessible via walker Home Equipment: Walker - 2 wheels;Wheelchair - manual;Bedside commode   Additional Comments[facility]      Prior Functioning/Environment Level of Independence: Needs assistance  Gait / Transfers Assistance Needed: Mod I transfers and wheelchair use in the house ADL's / Homemaking  Assistance Needed: must wear briefs, PCA assists with showering            OT Problem List: Decreased strength;Decreased activity tolerance;Impaired balance (sitting and/or standing);Decreased coordination;Decreased safety awareness;Decreased cognition;Decreased knowledge of use of DME or AE;Decreased knowledge of precautions;Obesity      OT Treatment/Interventions: Self-care/ADL training;Therapeutic exercise;Neuromuscular education;Energy conservation;DME and/or AE instruction;Manual therapy;Therapeutic activities;Patient/family education;Balance training    OT Goals(Current goals can be found in the care plan section) Acute Rehab OT Goals Patient Stated Goal: home after rehab OT Goal Formulation: With patient Time For Goal Achievement: 01/03/19 Potential to Achieve Goals: Good  OT Frequency: Min 2X/week   Barriers to D/C: Decreased caregiver support  Pt lives alone       Co-evaluation              AM-PAC OT "6 Clicks" Daily Activity     Outcome Measure Help from another person eating meals?: A Little Help from another person taking care of personal grooming?: A Little Help from another person toileting, which includes using toliet, bedpan, or urinal?: A Lot Help from another person bathing (including washing, rinsing, drying)?: A Lot Help from another person to put on and taking off regular upper body clothing?: A Little Help from another person to put on and taking off regular lower body clothing?: A Lot 6 Click Score: 15   End of Session Equipment Utilized During Treatment: Gait belt;Rolling walker Nurse Communication: Mobility status;Precautions  Activity Tolerance: Patient tolerated treatment well Patient left: with call bell/phone within reach;with chair alarm set;in bed  OT Visit Diagnosis: Unsteadiness on feet (R26.81);Muscle weakness (generalized) (M62.81)                Time: [id number] OT Time Calculation (min): 24 min Charges:  OT  General Charges $OT  Visit: 1 Visit OT Evaluation $OT Re-eval: 1 Re-eval OT Treatments $Self Care/Home Management : 8-22 mins  Sherryl Manges OTR/L Acute Rehabilitation Services Pager: [phone] Office: [phone]  [employer] Izick Gasbarro 12/20/2018, 5:10 PM

## 2018-12-20 NOTE — Progress Notes (Signed)
Pt tearful and very anxious daughter asked if she could get something for anxiety/ to help her calm down. RN paged physician awaiting call back

## 2018-12-20 NOTE — Progress Notes (Signed)
Pt was trying to reach her phone and pulled her Hemovac drain in half. Half of the tubing is still in her back and the other half with the drain was in the bed. RN paged Dr. Fulton Mole office and receptionist said she would pass the message along. Will continue to monitor

## 2018-12-20 NOTE — Progress Notes (Signed)
Physical Therapy Re--Evaluation  Patient Details Name: [name] [name] MRN: [id number] DOB: September 30, 1941 Today's Date: 12/20/2018   History of Present Illness  78 y.o female PACE patient who is wheelchair bound living on her own admitted to [facility] on 12/15/18 after fall. PMHx of diabetes, HTN, bilateral sensorineural hearing loss, peripheral neuropathy, sciatic nerve disease, urinary incontinence.  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 Venetia Maxon regarding surgical recommendations. Pt is now s/p C7-T1 Posterior cervical fusion per Dr. Venetia Maxon on 12/19/18  Clinical Impression  Pt admitted with above diagnosis. Pt currently with functional limitations due to the deficits listed below (see PT Problem List).  Pt is very cooperative and motivated to work with PT, she is able to amb ~ 32' today with min to mod assist however fatigues rapidly requiring seated rest; pt is at significant risk for continued falls given multiple medical issues, balance deficits, weakness in addition to deconditioning; continue to recommend SNF post acute to maximize independence; continue to follow  Pt will benefit from skilled PT to increase their independence and safety with mobility to allow discharge to the venue listed below.       Follow Up Recommendations SNF    Equipment Recommendations  None recommended by PT    Recommendations for Other Services       Precautions / Restrictions Precautions Precautions: Fall;Back Precaution Comments: no brace Restrictions Weight Bearing Restrictions: No      Mobility  Bed Mobility Overal bed mobility: Needs Assistance Bed Mobility: Rolling;Sidelying to Sit Rolling: Min guard Sidelying to sit: Min assist       General bed mobility comments: assist with trunk to upright, incr time needed;  hand over hand  tactile input to complete rolling as well as step by step cues  for sequencing of log roll technique  Transfers Overall transfer level: Needs assistance Equipment used: Rolling walker (2 wheeled) Transfers: Sit to/from Stand Sit to Stand: Min assist         General transfer comment: multi-modal cues for hand placement, assist to rise and stabilize in standing  Ambulation/Gait Ambulation/Gait assistance: Min assist;Mod assist Gait Distance (Feet): 18 Feet Assistive device: Rolling walker (2 wheeled) Gait Pattern/deviations: Step-through pattern;Trunk flexed     General Gait Details: assistance required for balance, posture, and weight shifting, pt requires frequent cues for position of RW; min A initially and then mod A required when becoming more fatigued; SOB with ambulation and SpO2 and HR WNL chair follow for safety  Stairs            Wheelchair Mobility    Modified Rankin (Stroke Patients Only)       Balance Overall balance assessment: Needs assistance Sitting-balance support: Feet unsupported;Single extremity supported Sitting balance-Leahy Scale: Fair Sitting balance - Comments: reliant on UE for sitting   Standing balance support: Bilateral upper extremity supported;During functional activity Standing balance-Leahy Scale: Poor Standing balance comment: reliant on BUE support                              Pertinent Vitals/Pain Pain Assessment: Faces Faces Pain Scale: Hurts a little bit Pain Location: mid back with bed mobility Pain Descriptors / Indicators: Grimacing;Guarding;Sore    Home Living Family/patient expects to be discharged to:: Private residence Living Arrangements: Alone Available Help at Discharge: Family;Personal care attendant Type  of Home: Apartment Home Access: Level entry     Home Layout: One level Home Equipment: Walker - 2 wheels;Wheelchair - manual;Bedside commode Additional Comments: 2 days/week at [employer]    Prior  Function Level of Independence: Needs assistance   Gait / Transfers Assistance Needed: Mod I transfers and wheelchair use in the house  ADL's / Homemaking Assistance Needed: must wear briefs, PCA assists with showering        Hand Dominance        Extremity/Trunk Assessment   Upper Extremity Assessment Upper Extremity Assessment: Defer to OT evaluation    Lower Extremity Assessment RLE Deficits / Details: numbness bilat feet at baseline; strength grossly 3/5 RLE Sensation: history of peripheral neuropathy LLE Deficits / Details: numbness bilat feet at baseline; strength grossly 3/5 LLE Sensation: history of peripheral neuropathy    Cervical / Trunk Assessment Cervical / Trunk Assessment: Kyphotic  Communication   Communication: No difficulties;HOH  Cognition Arousal/Alertness: Awake/alert Behavior During Therapy: WFL for tasks assessed/performed Overall Cognitive Status: Impaired/Different from baseline Area of Impairment: Safety/judgement;Problem solving                         Safety/Judgement: Decreased awareness of safety   Problem Solving: Requires verbal cues;Requires tactile cues;Difficulty sequencing General Comments: Noted some decreased safety awareness and problem solving, repeatedly begins to stand without assist although verbalizes fear of falling; pt is extremely talkative and requires redirection to task       General Comments General comments (skin integrity, edema, etc.): pt endorses multiple falls, she verbzilizes significant fear of falling at time of this PT re-eval today    Exercises     Assessment/Plan    PT Assessment Patient needs continued PT services  PT Problem List Decreased strength;Decreased balance;Decreased mobility;Decreased activity tolerance       PT Treatment Interventions      PT Goals (Current goals can be found in the Care Plan section)  Acute Rehab PT Goals Patient Stated Goal: home after rehab PT Goal  Formulation: With patient Time For Goal Achievement: 01/03/19 Potential to Achieve Goals: Good    Frequency Min 3X/week   Barriers to discharge Decreased caregiver support although currently with 3 family members in room that are staying with her most of the time while in hospital    Co-evaluation               AM-PAC PT "6 Clicks" Mobility  Outcome Measure Help needed turning from your back to your side while in a flat bed without using bedrails?: A Little Help needed moving from lying on your back to sitting on the side of a flat bed without using bedrails?: A Little Help needed moving to and from a bed to a chair (including a wheelchair)?: A Little Help needed standing up from a chair using your arms (e.g., wheelchair or bedside chair)?: A Lot Help needed to walk in hospital room?: A Lot Help needed climbing 3-5 steps with a railing? : Total 6 Click Score: 14    End of Session Equipment Utilized During Treatment: Gait belt Activity Tolerance: Patient tolerated treatment well;Patient limited by fatigue Patient left: in chair;with call bell/phone within reach;with family/visitor present Nurse Communication: Mobility status PT Visit Diagnosis: Unsteadiness on feet (R26.81);Muscle weakness (generalized) (M62.81);Other abnormalities of gait and mobility (R26.89)    Time: 1761-6073 PT Time Calculation (min) (ACUTE ONLY): 31 min   Charges:    $PT Re-evaluation: 1 Re-eval PT Treatments $Gait Training:  8-22 mins        Drucilla Chaletara Kennetta Pavlovic, PT  Pager: [phone] Acute Rehab Dept [facility]): [phone]   12/20/2018   [facility] HospitalWILLIAMS,Ermine Stebbins 12/20/2018, 10:26 AM

## 2018-12-20 NOTE — Progress Notes (Signed)
   Subjective: [name] is doing much better this morning.  She states that she her pain is well controlled with her current pain medications.  She was able to walk around with physical therapy.  Her daughters who are in the room states that she is looking much better today.  Objective:  Vital signs in last 24 hours: Vitals:   12/19/18 2020 12/19/18 2328 12/20/18 0452 12/20/18 0600  BP: (!) 155/67 (!) 176/76 (!) 203/88 (!) 163/89  Pulse: 100 65 61   Resp: 18 16 20    Temp: 98.2 F (36.8 C) 98.2 F (36.8 C) (!) 97.3 F (36.3 C)   TempSrc: Oral Oral Oral   SpO2: 95% 99% 100%   Weight:      Height:       Physical Exam Vitals signs and nursing note reviewed.  Constitutional:      Appearance: Normal appearance.  Neck:     Comments: Wound at the cervical spine with dressing overlying it. Drain in place with serosanguinous fluid. Neurological:     Mental Status: She is alert.  Psychiatric:        Mood and Affect: Mood normal.        Behavior: Behavior normal.     Assessment/Plan:  Principal Problem:   Cord compression (HCC) Active Problems:   Non-insulin dependent type 2 diabetes mellitus (HCC)   [name] has multilevel lumbar degenerative disease and was admitted for increasing weakness with significant myelopathy with cervical/thoracic instability with cord compression andedema now POD1 from C7-T1 cervical fusion. She is overall doing well--her pain is well controlled with oral medications and she is working with PT with improvement in her weakness. She will have her drain removed tomorrow and discharged to SNF.  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 BP remains elevated as well with sBP 150-200s.   - Continue Lantus 16units QD  - Continue Novolog 5  units TID with meals.  - Add SSI q4h--Moderate - Continue HCTZ 12.5 mg  - Added labetalol 200 mg BID  AKI: Likely 2/2 poor PO intake. Creatinine improving with IVFs. - Continue IVFs today. D/C tomorrow.  ZOX:WRUEAVWVT:Lovenox Diet: HH/CM  IVFs: None Code:Full code  Dispo: Anticipated dischargetomorrow.  Synetta ShadowPrince, Ragan Reale M, MD 12/20/2018, 6:29 AM Pager: [phone]

## 2018-12-20 NOTE — NC FL2 (Signed)
[place] MEDICAID FL2 LEVEL OF CARE SCREENING TOOL     IDENTIFICATION  Patient Name: [name] [name] Birthdate: 05-11-1941 Sex: female Admission Date (Current Location): 12/14/2018  [facility] and [place] Number:  [employer] and Address:  The [place], [address] [place], [place] [address]      Provider Number: [id number]  Attending Physician Name and Address:  Gust Rung, DO  Relative Name and Phone Number:  Alexia Freestone (daughter) [phone]    Current Level of Care: Hospital Recommended Level of Care: Skilled Nursing Facility Prior Approval Number:    Date Approved/Denied:   PASRR Number: [id number]  Discharge Plan: SNF    Current Diagnoses: Patient Active Problem List   Diagnosis Date Noted  . Cord compression (HCC) 12/15/2018  . Small bowel obstruction (HCC) 04/27/2016  . Non-insulin dependent type 2 diabetes mellitus (HCC) 04/27/2016  . Depression 04/27/2016  . Anxiety state 04/27/2016  . GERD (gastroesophageal reflux disease) 04/27/2016  . SBO (small bowel obstruction) (HCC) 04/27/2016  . Generalized weakness 02/03/2016  . Essential hypertension 02/03/2016  . Urinary frequency 01/04/2016  . Pelvic pressure in female 01/04/2016  . Hematuria 01/04/2016  . Herpes 12/22/2015    Orientation RESPIRATION BLADDER Height & Weight     Self, Situation, Place  Normal Continent, External catheter Weight: 86.7 kg Height:  5' (152.4 cm)  BEHAVIORAL SYMPTOMS/MOOD NEUROLOGICAL BOWEL NUTRITION STATUS      Continent Diet(see discharge summary, carb motified heart healthy)  AMBULATORY STATUS COMMUNICATION OF NEEDS Skin   Extensive Assist Verbally Skin abrasions(abrasion on head, ecchymosis on arms and legs, neck closed surgical incision, right toe wound/blister)                       Personal Care Assistance Level of Assistance  Bathing, Feeding, Dressing, Total care Bathing Assistance: Maximum assistance Feeding assistance:  Independent Dressing Assistance: Maximum assistance Total Care Assistance: Maximum assistance   Functional Limitations Info  Sight, Hearing, Speech Sight Info: Adequate Hearing Info: Impaired(hearing aid) Speech Info: Adequate    SPECIAL CARE FACTORS FREQUENCY  PT (By licensed PT), OT (By licensed OT)     PT Frequency: min 5x weekly OT Frequency: min 5x weekly            Contractures Contractures Info: Not present    Additional Factors Info  Code Status, Allergies Code Status Info: full Allergies Info: penicillins, sulfa antibiotics           Current Medications (12/20/2018):  This is the current hospital active medication list Current Facility-Administered Medications  Medication Dose Route Frequency Provider Last Rate Last Dose  . acetaminophen (TYLENOL) tablet 650 mg  650 mg Oral Q4H PRN Maeola Harman, MD       Or  . acetaminophen (TYLENOL) suppository 650 mg  650 mg Rectal Q4H PRN Maeola Harman, MD      . alum & mag hydroxide-simeth (MAALOX/MYLANTA) 200-200-20 MG/5ML suspension 30 mL  30 mL Oral Q6H PRN Maeola Harman, MD      . aspirin EC tablet 81 mg  81 mg Oral Daily Maeola Harman, MD   81 mg at 12/20/18 2111  . bisacodyl (DULCOLAX) suppository 10 mg  10 mg Rectal Daily PRN Maeola Harman, MD      . Chlorhexidine Gluconate Cloth 2 % PADS 6 each  6 each Topical Once Maeola Harman, MD      . dextrose 5 % and 0.45 % NaCl with KCl 20 mEq/L infusion  Intravenous Continuous Maeola HarmanStern, Joseph, MD   Stopped at 12/19/18 2056  . DULoxetine (CYMBALTA) DR capsule 30 mg  30 mg Oral Daily Maeola HarmanStern, Joseph, MD   30 mg at 12/20/18 0835  . heparin injection 5,000 Units  5,000 Units Subcutaneous Q8H Synetta ShadowPrince, Jamie M, MD   5,000 Units at 12/20/18 [id number]  . hydrochlorothiazide (MICROZIDE) capsule 12.5 mg  12.5 mg Oral Daily Maeola HarmanStern, Joseph, MD   12.5 mg at 12/20/18 0835  . HYDROcodone-acetaminophen (NORCO/VICODIN) 5-325 MG per tablet 2 tablet  2 tablet Oral Q4H PRN Maeola HarmanStern, Joseph, MD   2 tablet at  12/20/18 [phone]  . insulin aspart (novoLOG) injection 0-20 Units  0-20 Units Subcutaneous TID WC Maeola HarmanStern, Joseph, MD   4 Units at 12/20/18 [phone]  . insulin aspart (novoLOG) injection 5 Units  5 Units Subcutaneous TID WC Maeola HarmanStern, Joseph, MD   5 Units at 12/20/18 [phone]  . insulin glargine (LANTUS) injection 16 Units  16 Units Subcutaneous Daily Maeola HarmanStern, Joseph, MD   16 Units at 12/20/18 [phone]  . ketorolac (TORADOL) 15 MG/ML injection 15 mg  15 mg Intravenous Q6H Santos-Sanchez, Idalys, MD   15 mg at 12/20/18 0520  . lamoTRIgine (LAMICTAL) tablet 50 mg  50 mg Oral BID Maeola HarmanStern, Joseph, MD   50 mg at 12/20/18 0836  . lidocaine (LIDODERM) 5 % 1 patch  1 patch Transdermal Q24H Maeola HarmanStern, Joseph, MD   1 patch at 12/20/18 0500  . loratadine (CLARITIN) tablet 10 mg  10 mg Oral Daily Maeola HarmanStern, Joseph, MD   10 mg at 12/20/18 0836  . menthol-cetylpyridinium (CEPACOL) lozenge 3 mg  1 lozenge Oral PRN Maeola HarmanStern, Joseph, MD       Or  . phenol [facility](CHLORASEPTIC) mouth spray 1 spray  1 spray Mouth/Throat PRN Maeola HarmanStern, Joseph, MD      . methocarbamol (ROBAXIN) tablet 500 mg  500 mg Oral Q6H PRN Maeola HarmanStern, Joseph, MD   500 mg at 12/20/18 [id number]   Or  . methocarbamol (ROBAXIN) 500 mg in dextrose 5 % 50 mL IVPB  500 mg Intravenous Q6H PRN Maeola HarmanStern, Joseph, MD      . morphine 2 MG/ML injection 2 mg  2 mg Intravenous Q2H PRN Maeola HarmanStern, Joseph, MD   2 mg at 12/20/18 0230  . ondansetron (ZOFRAN) tablet 4 mg  4 mg Oral Q6H PRN Maeola HarmanStern, Joseph, MD       Or  . ondansetron [facility](ZOFRAN) injection 4 mg  4 mg Intravenous Q6H PRN Maeola HarmanStern, Joseph, MD      . oxyCODONE (Oxy IR/ROXICODONE) immediate release tablet 5 mg  5 mg Oral Q3H PRN Maeola HarmanStern, Joseph, MD   5 mg at 12/19/18 1759  . pantoprazole (PROTONIX) injection 40 mg  40 mg Intravenous QHS Maeola HarmanStern, Joseph, MD   40 mg at 12/19/18 2048  . polyethylene glycol (MIRALAX / GLYCOLAX) packet 17 g  17 g Oral Daily PRN Maeola HarmanStern, Joseph, MD      . senna-docusate (Senokot-S) tablet 2 tablet  2 tablet Oral BID Maeola HarmanStern, Joseph, MD   2 tablet at  12/20/18 [phone]  . simvastatin (ZOCOR) tablet 10 mg  10 mg Oral Daily Maeola HarmanStern, Joseph, MD   10 mg at 12/20/18 0837  . triamcinolone (NASACORT) nasal inhaler 1 spray  1 spray Nasal Daily Maeola HarmanStern, Joseph, MD   1 spray at 12/20/18 [id number]  . valACYclovir (VALTREX) tablet 1,000 mg  1,000 mg Oral Daily Maeola HarmanStern, Joseph, MD   1,000 mg at 12/20/18 0837  . vancomycin (VANCOCIN) 1,250 mg in sodium chloride 0.9 %  250 mL IVPB  1,250 mg Intravenous Q24H Gust Rung, DO   Stopped at 12/19/18 2233  . zolpidem (AMBIEN) tablet 5 mg  5 mg Oral QHS PRN Maeola Harman, MD         Discharge Medications: Please see discharge summary for a list of discharge medications.  Relevant Imaging Results:  Relevant Lab Results:   Additional Information SSN: [id number]  Gildardo Griffes, LCSW

## 2018-12-21 DIAGNOSIS — E876 Hypokalemia: Secondary | ICD-10-CM

## 2018-12-21 DIAGNOSIS — E871 Hypo-osmolality and hyponatremia: Secondary | ICD-10-CM

## 2018-12-21 LAB — GLUCOSE, CAPILLARY
Glucose-Capillary: 225 mg/dL — ABNORMAL HIGH (ref 70–99)
Glucose-Capillary: 176 mg/dL — ABNORMAL HIGH (ref 70–99)
Glucose-Capillary: 164 mg/dL — ABNORMAL HIGH (ref 70–99)
Glucose-Capillary: 162 mg/dL — ABNORMAL HIGH (ref 70–99)
Glucose-Capillary: 129 mg/dL — ABNORMAL HIGH (ref 70–99)
Glucose-Capillary: 186 mg/dL — ABNORMAL HIGH (ref 70–99)

## 2018-12-21 LAB — CBC
HCT: 37.8 % (ref 36.0–46.0)
Hemoglobin: 11.8 g/dL — ABNORMAL LOW (ref 12.0–15.0)
MCH: 27.8 pg (ref 26.0–34.0)
MCHC: 31.2 g/dL (ref 30.0–36.0)
MCV: 89.2 fL (ref 80.0–100.0)
Platelets: 150 10*3/uL (ref 150–400)
RBC: 4.24 MIL/uL (ref 3.87–5.11)
RDW: 13.4 % (ref 11.5–15.5)
WBC: 13.6 10*3/uL — ABNORMAL HIGH (ref 4.0–10.5)
nRBC: 0 % (ref 0.0–0.2)

## 2018-12-21 LAB — BASIC METABOLIC PANEL
Anion gap: 11 (ref 5–15)
BUN: 22 mg/dL (ref 8–23)
CO2: 23 mmol/L (ref 22–32)
Calcium: 8.3 mg/dL — ABNORMAL LOW (ref 8.9–10.3)
Chloride: 97 mmol/L — ABNORMAL LOW (ref 98–111)
Creatinine, Ser: 0.97 mg/dL (ref 0.44–1.00)
GFR calc Af Amer: >60 mL/min (ref >60)
GFR calc non Af Amer: 56 mL/min — ABNORMAL LOW (ref >60)
GLUCOSE: 206 mg/dL — AB (ref 70–99)
Potassium: 3.4 mmol/L — ABNORMAL LOW (ref 3.5–5.1)
Sodium: 131 mmol/L — ABNORMAL LOW (ref 135–145)

## 2018-12-21 MED ORDER — INSULIN ASPART 100 UNIT/ML ~~LOC~~ SOLN
0.0000 [IU] | Freq: Three times a day (TID) | SUBCUTANEOUS | Status: DC
  Administered 2018-12-22: 3 [IU] via SUBCUTANEOUS

## 2018-12-21 MED ORDER — ACETAMINOPHEN 325 MG PO TABS
650.0000 mg | ORAL_TABLET | Freq: Four times a day (QID) | ORAL | Status: DC | PRN
  Administered 2018-12-21 (×2): 650 mg via ORAL
  Filled 2018-12-21 (×2): qty 2

## 2018-12-21 NOTE — Progress Notes (Signed)
Subjective: Patient reports sore in back  Objective: Vital signs in last 24 hours: Temp:  [98.2 F (36.8 C)-98.4 F (36.9 C)] 98.4 F (36.9 C) (01/10 0300) Pulse Rate:  [68-74] 70 (01/10 0300) Resp:  [14-19] 16 (01/10 0300) BP: (124-168)/(57-74) 133/68 (01/10 0300) SpO2:  [76 %-100 %] 100 % (01/10 0300) Weight:  [87.4 kg] 87.4 kg (01/10 0658)  Intake/Output from previous day: 01/09 0701 - 01/10 0700 In: 480 [P.O.:480] Out: 950 [Urine:800; Drains:150] Intake/Output this shift: No intake/output data recorded.  Physical Exam: Strength full in legs.  Numbness improved.  Lab Results: Recent Labs    12/20/18 0202 12/21/18 0259  WBC 21.3* 13.6*  HGB 11.9* 11.8*  HCT 36.3 37.8  PLT 203 150   BMET Recent Labs    12/20/18 0202 12/21/18 0259  NA 130* 131*  K 3.9 3.4*  CL 95* 97*  CO2 23 23  GLUCOSE 224* 206*  BUN 30* 22  CREATININE 1.06* 0.97  CALCIUM 8.5* 8.3*    Studies/Results: No results found.  Assessment/Plan: OK to discharge to SNF today.  F/U in office 3 weeks postop.    LOS: 6 days    Dorian Heckle, MD 12/21/2018, 7:38 AM

## 2018-12-21 NOTE — Plan of Care (Signed)

## 2018-12-21 NOTE — Progress Notes (Signed)
Patient ID: [name] [name], female   DOB: Jan 07, 1941, 77 y.o.   MRN: [id number] Drain pulled on Dr. Fredrich Birks order. DSD to site. May remove incision drsg and drain site dressing in 2-3 days. Ok to shower and wash over site. Office visit for cervical f/u in 3-4 weeks. Lumbar issue will likely be followed by her insurance-approved neurosurgeon whom she has seen in the past.

## 2018-12-21 NOTE — Progress Notes (Signed)
   Subjective: [name] [name] is in a significant amount of pain from her surgery site.  She states that her pain medications are helping but do not last for very long.  She is requiring IV Dilaudid.  She had one bowel movement yesterday.  She does not have any other acute complaints.  Objective:  Vital signs in last 24 hours: Vitals:   12/20/18 2032 12/21/18 0025 12/21/18 0300 12/21/18 0658  BP: 124/64 130/74 133/68   Pulse: 71 74 70   Resp: 14 16 16    Temp: 98.2 F (36.8 C) 98.2 F (36.8 C) 98.4 F (36.9 C)   TempSrc: Oral Oral Oral   SpO2: 97% 100% 100%   Weight:    87.4 kg  Height:       Physical Exam Vitals signs and nursing note reviewed.  Constitutional:      Appearance: Normal appearance.  Cardiovascular:     Rate and Rhythm: Normal rate and regular rhythm.  Pulmonary:     Effort: Pulmonary effort is normal. No respiratory distress.     Breath sounds: Normal breath sounds.  Musculoskeletal:     Comments: Bandage over cervical spine surgical site. No warmth or erythema from the area.  Neurological:     Mental Status: She is alert.  Psychiatric:        Mood and Affect: Mood normal.        Behavior: Behavior normal.     Assessment/Plan:  Principal Problem:   Cord compression [facility]) Active Problems:   Non-insulin dependent type 2 diabetes mellitus (HCC)  [name] [name] was admitted for increasing weakness with significant myelopathy with cervical/thoracic instability, cord compression, andedema now POD2 from C7-T1 cervical fusion.  Drain removed today by neurosurgery.  She has had significant pain from the surgical site today and has needed IV pain medications.  We will continue to work on her pain management today with possible discharge tomorrow if she improves.  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 -Acetaminophen 650 mg q4h PRN -Oxycodone IR 5 mg q3h PRN pain - Norco 5-325 mg 2 tablets every 4 hours PRN -  Morphine 2 mg every 2 hours PRN pain  NeuropathicLE pain:  -Pain regimen per above.  Steroid-induced DM and HTN:CBG remain high in the 160s-220s while on the glycemic control medications below.  I do believe that her blood sugars will continue to improve after discontinuing steroids yesterday.  BP well controlled on labetalol and hydrochlorothiazide.  She is however developing hypokalemia and hyponatremia likely due to hydrochlorothiazide.  I will discontinue HCTZ today and continue labetalol. - Continue Lantus 16units QD something - Continue Novolog 5 units TID with meals.  - Continue SSI q4h--Moderate - Discontinue HCTZ 12.5 mg - Continue labetalol 200 mg BID  AKI: Resolved with IV fluids.  ZOX:WRUEAVW Diet:HH/CM IVFs:None Code:Full code  Dispo: Anticipated discharge tomorrow.  Synetta Shadow, MD 12/21/2018, 1:12 PM Pager: [phone]

## 2018-12-22 DIAGNOSIS — Z881 Allergy status to other antibiotic agents status: Secondary | ICD-10-CM

## 2018-12-22 DIAGNOSIS — Z880 Allergy status to penicillin: Secondary | ICD-10-CM

## 2018-12-22 DIAGNOSIS — Z79899 Other long term (current) drug therapy: Secondary | ICD-10-CM

## 2018-12-22 DIAGNOSIS — Z794 Long term (current) use of insulin: Secondary | ICD-10-CM

## 2018-12-22 LAB — GLUCOSE, CAPILLARY
Glucose-Capillary: 153 mg/dL — ABNORMAL HIGH (ref 70–99)
Glucose-Capillary: 188 mg/dL — ABNORMAL HIGH (ref 70–99)
Glucose-Capillary: 251 mg/dL — ABNORMAL HIGH (ref 70–99)

## 2018-12-22 LAB — BASIC METABOLIC PANEL
Anion gap: 8 (ref 5–15)
BUN: 22 mg/dL (ref 8–23)
CO2: 25 mmol/L (ref 22–32)
Calcium: 8.1 mg/dL — ABNORMAL LOW (ref 8.9–10.3)
Chloride: 101 mmol/L (ref 98–111)
Creatinine, Ser: 0.99 mg/dL (ref 0.44–1.00)
GFR calc Af Amer: >60 mL/min (ref >60)
GFR calc non Af Amer: 55 mL/min — ABNORMAL LOW (ref >60)
GLUCOSE: 204 mg/dL — AB (ref 70–99)
Potassium: 3.8 mmol/L (ref 3.5–5.1)
Sodium: 134 mmol/L — ABNORMAL LOW (ref 135–145)

## 2018-12-22 MED ORDER — LABETALOL HCL 100 MG PO TABS: 100.0000 mg | ORAL_TABLET | Freq: Two times a day (BID) | ORAL | Status: DC

## 2018-12-22 MED ORDER — HYDROXYZINE HCL 10 MG PO TABS: 10.0000 mg | ORAL_TABLET | Freq: Every day | ORAL | 0 refills | Status: DC | PRN

## 2018-12-22 MED ORDER — BISACODYL 10 MG RE SUPP: 10.0000 mg | Freq: Every day | RECTAL | 0 refills | Status: DC | PRN

## 2018-12-22 MED ORDER — HYDROCODONE-ACETAMINOPHEN 5-325 MG PO TABS: 2.0000 | ORAL_TABLET | ORAL | 0 refills | Status: AC | PRN

## 2018-12-22 MED ORDER — ACETAMINOPHEN 325 MG PO TABS: 650.0000 mg | ORAL_TABLET | Freq: Four times a day (QID) | ORAL | Status: DC | PRN

## 2018-12-22 NOTE — Progress Notes (Signed)
   Subjective: [name] [name] denies any pain currently. She is very teary this morning stating that "she does not want to go" when referring to SNF. She states "I used to be so independent." I explained to her that it will be very important for her to continue PT at the SNF so that she can build her strength to eventually return home. She is agreeable to discharge today.  Objective:  Vital signs in last 24 hours: Vitals:   12/21/18 1401 12/21/18 2006 12/21/18 2110 12/22/18 0401  BP: 134/68 134/70 (!) 142/55 (!) 128/58  Pulse: 71 77 80 64  Resp:  20  (!) 22  Temp: 98 F (36.7 C) 98 F (36.7 C)  98.1 F (36.7 C)  TempSrc: Oral Oral  Oral  SpO2: 97% 98%  97%  Weight:      Height:       General: Lying in bed in no acute distress. MSK: Bandage over cervical spine surgery site. Non-tender to light palpation.  Psych: Teary throughout our conversation. Behavior normal.  Assessment/Plan:  Principal Problem:   Cord compression [facility]) Active Problems:   Non-insulin dependent type 2 diabetes mellitus (HCC)  [name] [name] was admitted for increasing weakness with significant myelopathy with cervical/thoracic instability, cord compression, andedemanow POD3 from C7-T1 cervical fusion. Her pain has improved and she is only requiring oral medications overnight. She is in stable condition for discharge today to SNF.   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  Steroid-induced DM and HTN:CBG remain elevated but much improved while on the glycemic regiment below. Her blood sugars will continue to improve since she is now off of steroids. Additionally her blood pressures have remained within normal limits on labetalol only. Both her insulin and antihypertensive will need to be weaned off at SNF with continued  improvement in her blood sugars and BP.  -ContinueLantus 16units QD something -ContinueNovolog 5 units TID with meals.  -Continue SSI TID - Continue labetalol 200 mg BID  XYI:[id number] Diet:HH/CM IVFs:None Code:Full code  Dispo: Anticipated discharge today.  Synetta Shadow, MD 12/22/2018, 7:40 AM Pager: [phone]

## 2018-12-22 NOTE — Clinical Social Work Placement (Signed)
   CLINICAL SOCIAL WORK PLACEMENT  NOTE  Date:  12/22/2018  Patient Details  Name: [name] [name] MRN: [id number] Date of Birth: 1941/01/09  Clinical Social Work is seeking post-discharge placement for this patient at the Skilled  Nursing Facility level of care (*CSW will initial, date and re-position this form in  chart as items are completed):      Patient/family provided with [facility] Clinical Social Work Department's list of facilities offering this level of care within the geographic area requested by the patient (or if unable, by the patient's family).  Yes   Patient/family informed of their freedom to choose among providers that offer the needed level of care, that participate in Medicare, Medicaid or managed care program needed by the patient, have an available bed and are willing to accept the patient.      Patient/family informed of [facility]'s ownership interest in [facility] and [facility], as well as of the fact that they are under no obligation to receive care at these facilities.  PASRR submitted to EDS on       PASRR number received on 12/20/18     Existing PASRR number confirmed on       FL2 transmitted to all facilities in geographic area requested by pt/family on 12/20/18     FL2 transmitted to all facilities within larger geographic area on 12/20/18     Patient informed that his/her managed care company has contracts with or will negotiate with certain facilities, including the following:        Yes   Patient/family informed of bed offers received.  Patient chooses bed at [facility]     Physician recommends and patient chooses bed at      Patient to be transferred to [facility] on 12/22/18.  Patient to be transferred to facility by [facility]     Patient family notified on 12/22/18 of transfer.  Name of family member notified:  Alexia Freestone (daughter)      PHYSICIAN       Additional Comment:     _______________________________________________ Gildardo Griffes, LCSW 12/22/2018, 8:39 AM

## 2018-12-22 NOTE — Progress Notes (Addendum)
Patient will DC to: [facility] Anticipated DC date: 12/22/2018 Family notified: Patty  Transport by: Sharin Mons  Per MD patient ready for DC to [facility]. RN, patient, patient's family, and facility notified of DC. Discharge Summary sent to facility. RN given number for report [phone] Room 225B. DC packet on chart. Ambulance transport requested for patient.  CSW signing off.  [place], [place] [phone]

## 2018-12-22 NOTE — Plan of Care (Signed)

## 2018-12-22 NOTE — Progress Notes (Signed)
  NEUROSURGERY PROGRESS NOTE   No issues overnight.  Appropriate upperback soreness  EXAM:  BP (!) 128/58 (BP Location: Right Arm)   Pulse 64   Temp 98.1 F (36.7 C) (Oral)   Resp (!) 22   Ht 5' (1.524 m)   Wt 87.4 kg   SpO2 97%   BMI 37.63 kg/m   Awake, alert, oriented  Speech fluent, appropriate  CN grossly intact  5/5 BUE and BLE Wound c/d/i  PLAN Stable neurologically this am Has been given medical clearance for SNF today. F/U with Dr Venetia Maxon in 3 weeks.

## 2018-12-22 NOTE — Discharge Summary (Addendum)
Name: [name] [name] MRN: [id number] DOB: 10-19-1941 78 y.o. 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   Sulfa Antibiotics Nausea And Vomiting      Medication List    STOP taking these medications   oxycodone-acetaminophen 2.5-325 MG tablet Commonly known as:  PERCOCET     TAKE these medications   acetaminophen 325 MG tablet Commonly known as:  TYLENOL Take 2 tablets (650 mg total) by mouth every 6 (six) hours as needed for mild pain, moderate pain, fever or headache.   aspirin EC 81 MG tablet Take 81 mg by mouth daily.   BIOFREEZE ROLL-ON COLORLESS 4 % Gel Generic drug:  Menthol (Topical Analgesic) Apply topically.   bisacodyl 10 MG suppository Commonly known as:  DULCOLAX Place 1 suppository (10 mg total) rectally daily as needed for moderate constipation.   DULoxetine 30 MG capsule Commonly known as:  CYMBALTA Take 30 mg by mouth daily.   EPIPEN 2-PAK 0.3 mg/0.3 mL Soaj injection Generic drug:  EPINEPHrine Inject 0.3 mg into the muscle once.   eucerin lotion Apply topically 4 (four) times daily as needed for dry skin.   EYE HEALTH FORMULA PO Take 1 capsule by mouth daily.   fexofenadine 180 MG tablet Commonly known as:   ALLEGRA Take 180 mg by mouth daily.   HYDROcodone-acetaminophen 5-325 MG tablet Commonly known as:  NORCO/VICODIN Take 2 tablets by mouth every 4 (four) hours as needed for [name] to 7 days for severe pain ((score 7 to 10)).   hydrOXYzine 10 MG tablet Commonly known as:  ATARAX/VISTARIL Take 1 tablet (10 mg total) by mouth daily as needed for anxiety.   labetalol 100 MG tablet Commonly known as:  NORMODYNE Take 1 tablet (100 mg total) by mouth 2 (two) times daily.   lamoTRIgine 25 MG tablet Commonly known as:  LAMICTAL Take 50 mg by mouth 2 (two) times daily.   montelukast 10 MG tablet Commonly known as:  SINGULAIR Take 1 tablet (10 mg total) by mouth at bedtime.   NASACORT ALLERGY 24HR 55 MCG/ACT Aero nasal inhaler Generic drug:  triamcinolone Place 1 spray into the nose daily.   nystatin powder Generic drug:  nystatin Apply topically 3 (three) times daily.   pantoprazole 40 MG tablet Commonly known as:  PROTONIX Take 40 mg by mouth every morning. What changed:  Another medication with the same name was removed. Continue taking this medication, and follow the directions you see here.   polyethylene glycol packet Commonly known as:  MIRALAX / GLYCOLAX Take 17 g by mouth daily.   PREVIDENT 5000 BOOSTER 1.1 % Pste Generic drug:  Sodium Fluoride Place 1 application onto teeth daily.   senna-docusate 8.6-50 MG tablet Commonly known as:  Senokot-S Take 2 tablets by mouth at bedtime.   simvastatin 10 MG  tablet Commonly known as:  ZOCOR Take 10 mg by mouth daily.   valACYclovir 1000 MG tablet Commonly known as:  VALTREX Take 1,000 mg by mouth daily.       Disposition and follow-[name]:   MariahMariah [name] [facility].  At the hospital follow [name] visit please address:  1.  Continue PT to improve strength.  2. Ensure that [name] with Dr. Venetia MaxonStern, neurosurgery in 3 weeks.   3. For pain control [name] [name]  prefers Tylenol over Norco. Please try this first.   4.  Labs / imaging needed at time of follow-[name]: None  5.  Pending labs/ test needing follow-[name]: None  Follow-[name] Appointments:  Contact information for follow-[name] providers    Estevan OaksBrown, Beverly Ann, NP Follow [name].   Specialty:  Nurse Practitioner Contact information: [address] [place] [address] [phone]        Maeola HarmanStern, Joseph, MD Follow [name].   Specialty:  Neurosurgery Why:  Please call to make an appointment for 3 weeks from now. Contact information: [address] [place] [address] [phone]            Contact information for after-discharge care    Destination    [facility] .   Service:  Skilled Nursing Contact information: [address] [place] [id number] [phone]                   Hospital Course by problem list: 1. 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 She was started on decadron and underwent surgical decompression and stabilization C 7 / T 1 level. Decadron was then discontinued and her pain was controlled with meds. Prior to discharge she was in stable condition. She will need to follow-[name] with Dr. Venetia MaxonStern in 3 weeks. Please ensure that she continues to work with PT to improve her strength.   2. Steroid-induced diabetes: She developed hyperglycemia after starting decadron. She was managed with insulin. She was discharged with Lantus 16 units QD, Novolog 5 units TID with meals, and Novolog SSI TID. I forsee that her blood sugar will improve in the next several days after being off of steroids. Please ensure that you are titrating down her insulin as her blood sugars improve.   3. Steroid-induced hypertension: She developed hypertension  after starting decadron. She was managed with antihypertensives and remained well-controlled on labetalol 100 mg BID. Please ensure that you are titrating down her antihypertensives as her blood pressure improves.  Discharge Vitals:   BP (!) 128/58 (BP Location: Right Arm)   Pulse 64   Temp 98.1 F (36.7 C) (Oral)   Resp (!) 22   Ht 5' (1.524 m)   Wt 87.4 kg   SpO2 97%   BMI 37.63 kg/m   Pertinent Labs, Studies, and Procedures:  12/14/18 MR Thoracic/Thoracic Spine:  MR THORACIC SPINE IMPRESSION No acute thoracic finding. Multiple cervical and thoracic segmentation anomalies as described above. 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 Consideration of decompression at this level suggested.  MR LUMBAR SPINE IMPRESSION No acute lumbar finding. Multilevel degenerative changes without compressive central canal stenosis. Potential for neural compression on the left at L4-5 and L5-S1.  Discharge Instructions: Discharge Instructions    Diet - low sodium heart healthy   Complete by:  As directed    Discharge instructions   Complete by:  As directed    Please see discharge instructions.   Increase activity slowly   Complete by:  As directed       Signed: Synetta ShadowPrince, Azael Ragain M, MD 12/22/2018, 8:06 AM   Pager: [phone]

## 2018-12-22 NOTE — Progress Notes (Signed)
Assisted pt to bedside commode, x2 assist. Can do few steps with a walker. Upper back incision clean and dry, open to air. Pain meds given as needed. Discharged pt to [facility] via [facility]. Report was given to Smiley Houseman at [facility].

## 2019-02-13 ENCOUNTER — Other Ambulatory Visit: Payer: Self-pay

## 2019-02-13 ENCOUNTER — Emergency Department (HOSPITAL_COMMUNITY)
Admission: EM | Admit: 2019-02-13 | Discharge: 2019-02-13 | Discharge status: Against Medical Advice | Payer: Medicare (Managed Care) | Attending: Emergency Medicine | Admitting: Emergency Medicine

## 2019-02-13 DIAGNOSIS — Z043 Encounter for examination and observation following other accident: Secondary | ICD-10-CM | POA: Diagnosis present

## 2019-02-13 DIAGNOSIS — Z5321 Procedure and treatment not carried out due to patient leaving prior to being seen by health care provider: Secondary | ICD-10-CM | POA: Diagnosis not present

## 2019-02-13 NOTE — ED Triage Notes (Signed)
To ED via [facility] - from home - Arita Miss NP called because pt fell at home on SUnday; pt has no complaints- Recent C-spine surgery in January.

## 2019-02-13 NOTE — ED Notes (Signed)
Pts daughter at desk stating pt is ready to leave. Staff told her that she should not leave but pt refuses to stay. IV taken out.

## 2019-03-27 ENCOUNTER — Encounter (HOSPITAL_COMMUNITY): Payer: Self-pay | Admitting: Emergency Medicine

## 2019-03-27 ENCOUNTER — Emergency Department (HOSPITAL_COMMUNITY)
Admission: EM | Admit: 2019-03-27 | Discharge: 2019-03-27 | Disposition: A | Discharge status: Home / Self Care | Payer: Medicare (Managed Care) | Attending: Emergency Medicine | Admitting: Emergency Medicine

## 2019-03-27 ENCOUNTER — Emergency Department (HOSPITAL_COMMUNITY): Payer: Medicare (Managed Care)

## 2019-03-27 ENCOUNTER — Other Ambulatory Visit: Payer: Self-pay

## 2019-03-27 DIAGNOSIS — W19XXXA Unspecified fall, initial encounter: Secondary | ICD-10-CM

## 2019-03-27 DIAGNOSIS — S79911A Unspecified injury of right hip, initial encounter: Secondary | ICD-10-CM | POA: Insufficient documentation

## 2019-03-27 DIAGNOSIS — M25551 Pain in right hip: Secondary | ICD-10-CM

## 2019-03-27 DIAGNOSIS — Z79899 Other long term (current) drug therapy: Secondary | ICD-10-CM | POA: Insufficient documentation

## 2019-03-27 DIAGNOSIS — W06XXXA Fall from bed, initial encounter: Secondary | ICD-10-CM | POA: Insufficient documentation

## 2019-03-27 DIAGNOSIS — Y998 Other external cause status: Secondary | ICD-10-CM | POA: Diagnosis not present

## 2019-03-27 DIAGNOSIS — E119 Type 2 diabetes mellitus without complications: Secondary | ICD-10-CM | POA: Insufficient documentation

## 2019-03-27 DIAGNOSIS — S3991XA Unspecified injury of abdomen, initial encounter: Secondary | ICD-10-CM | POA: Insufficient documentation

## 2019-03-27 DIAGNOSIS — Z7982 Long term (current) use of aspirin: Secondary | ICD-10-CM | POA: Diagnosis not present

## 2019-03-27 DIAGNOSIS — Y9389 Activity, other specified: Secondary | ICD-10-CM | POA: Diagnosis not present

## 2019-03-27 DIAGNOSIS — S0990XA Unspecified injury of head, initial encounter: Secondary | ICD-10-CM | POA: Insufficient documentation

## 2019-03-27 DIAGNOSIS — Y9289 Other specified places as the place of occurrence of the external cause: Secondary | ICD-10-CM | POA: Diagnosis not present

## 2019-03-27 DIAGNOSIS — G9763 Postprocedural seroma of a nervous system organ or structure following a nervous system procedure: Secondary | ICD-10-CM | POA: Diagnosis not present

## 2019-03-27 DIAGNOSIS — I10 Essential (primary) hypertension: Secondary | ICD-10-CM | POA: Insufficient documentation

## 2019-03-27 LAB — CBC WITH DIFFERENTIAL/PLATELET
Abs Immature Granulocytes: 0.04 10*3/uL (ref 0.00–0.07)
Basophils Absolute: 0.1 10*3/uL (ref 0.0–0.1)
Basophils Relative: 1 %
Eosinophils Absolute: 0.2 10*3/uL (ref 0.0–0.5)
Eosinophils Relative: 2 %
HCT: 40.8 % (ref 36.0–46.0)
Hemoglobin: 12.3 g/dL (ref 12.0–15.0)
Immature Granulocytes: 0 %
Lymphocytes Relative: 13 %
Lymphs Abs: 1.3 10*3/uL (ref 0.7–4.0)
MCH: 25.6 pg — ABNORMAL LOW (ref 26.0–34.0)
MCHC: 30.1 g/dL (ref 30.0–36.0)
MCV: 84.8 fL (ref 80.0–100.0)
Monocytes Absolute: 1.1 10*3/uL — ABNORMAL HIGH (ref 0.1–1.0)
Monocytes Relative: 11 %
Neutro Abs: 7.4 10*3/uL (ref 1.7–7.7)
Neutrophils Relative %: 73 %
Platelets: 169 10*3/uL (ref 150–400)
RBC: 4.81 MIL/uL (ref 3.87–5.11)
RDW: 15.4 % (ref 11.5–15.5)
WBC: 10.2 10*3/uL (ref 4.0–10.5)
nRBC: 0 % (ref 0.0–0.2)

## 2019-03-27 LAB — COMPREHENSIVE METABOLIC PANEL
ALT: 13 U/L (ref 0–44)
AST: 15 U/L (ref 15–41)
Albumin: 3.8 g/dL (ref 3.5–5.0)
Alkaline Phosphatase: 89 U/L (ref 38–126)
Anion gap: 13 (ref 5–15)
BUN: 12 mg/dL (ref 8–23)
CO2: 26 mmol/L (ref 22–32)
Calcium: 9.2 mg/dL (ref 8.9–10.3)
Chloride: 96 mmol/L — ABNORMAL LOW (ref 98–111)
Creatinine, Ser: 0.8 mg/dL (ref 0.44–1.00)
GFR calc Af Amer: >60 mL/min (ref >60)
GFR calc non Af Amer: >60 mL/min (ref >60)
Glucose, Bld: 286 mg/dL — ABNORMAL HIGH (ref 70–99)
Potassium: 3.4 mmol/L — ABNORMAL LOW (ref 3.5–5.1)
Sodium: 135 mmol/L (ref 135–145)
Total Bilirubin: 0.5 mg/dL (ref 0.3–1.2)
Total Protein: 6.9 g/dL (ref 6.5–8.1)

## 2019-03-27 LAB — URINALYSIS, ROUTINE W REFLEX MICROSCOPIC
Bilirubin Urine: NEGATIVE
Glucose, UA: >=500 mg/dL — AB
Hgb urine dipstick: NEGATIVE
Ketones, ur: NEGATIVE mg/dL
Nitrite: NEGATIVE
Protein, ur: NEGATIVE mg/dL
Specific Gravity, Urine: 1.024 (ref 1.005–1.030)
pH: 7 (ref 5.0–8.0)

## 2019-03-27 LAB — SEDIMENTATION RATE: Sed Rate: 20 mm/hr (ref 0–22)

## 2019-03-27 LAB — LACTIC ACID, PLASMA
Lactic Acid, Venous: 2.6 mmol/L (ref 0.5–1.9)
Lactic Acid, Venous: 1.8 mmol/L (ref 0.5–1.9)

## 2019-03-27 LAB — C-REACTIVE PROTEIN: CRP: 7.6 mg/dL — ABNORMAL HIGH (ref <1.0)

## 2019-03-27 LAB — I-STAT CREATININE, ED: Creatinine, Ser: 0.8 mg/dL (ref 0.44–1.00)

## 2019-03-27 LAB — CBG MONITORING, ED: Glucose-Capillary: 275 mg/dL — ABNORMAL HIGH (ref 70–99)

## 2019-03-27 MED ORDER — GADOBUTROL 1 MMOL/ML IV SOLN
8.0000 mL | Freq: Once | INTRAVENOUS | Status: AC | PRN
  Administered 2019-03-27: 8 mL via INTRAVENOUS

## 2019-03-27 MED ORDER — IOHEXOL 300 MG/ML  SOLN
75.0000 mL | Freq: Once | INTRAMUSCULAR | Status: AC | PRN
  Administered 2019-03-27: 75 mL via INTRAVENOUS

## 2019-03-27 MED ORDER — SODIUM CHLORIDE 0.9 % IV BOLUS
1000.0000 mL | Freq: Once | INTRAVENOUS | Status: AC
  Administered 2019-03-27: 1000 mL via INTRAVENOUS

## 2019-03-27 NOTE — Discharge Instructions (Signed)
You were seen today after a fall. There is a fluid collection on your CT and MRI. You will need to see Dr. Venetia Maxon in the office ASAP. Call today to schedule an appointment. Return to the ED immediately with any fever, chills, body aches, neck pain, and/or and new weakness/numbness. Keep you cervical collar in place at all times.

## 2019-03-27 NOTE — ED Notes (Signed)
Patient is hard of hearing

## 2019-03-27 NOTE — ED Notes (Signed)
Patient transported to X-ray 

## 2019-03-27 NOTE — ED Notes (Signed)
Patient transported to MRI 

## 2019-03-27 NOTE — ED Notes (Signed)
Date and time results received: 03/27/19 0537  Test: Lactic Critical Value: 2.6  Name of Provider Notified: EDP, [place]  Orders Received? Or Actions Taken?: n/a

## 2019-03-27 NOTE — ED Provider Notes (Signed)
Blood pressure (!) 177/73, pulse 78, temperature 97.8 F (36.6 C), temperature source Oral, resp. rate 18, height 5' (1.524 m), weight 87.4 kg, SpO2 99 %.  Assuming care from Dr. Manus [name].  In short, [name] [name] is a 78 y.o. female with a chief complaint of Fall .  Refer to the original H&P for additional details.  The current [name] of care is to follow up on MRI and additional labs.  08:45 AM  Spoke with Dr. Venetia [name] regarding the patient's presentation and MRI.  Low suspicion clinically for infectious process.  Patient is not experiencing fevers or other infection symptoms at home.  The posterior neck is not erythematous.  Patient is at her neurologic baseline.  Dr. Venetia [name] recommends keeping the patient in the [place] collar and can have her follow-up in the office rather than starting antibiotics and admitting to the hospital.   Discussed case with the patient. She is comfortable with discharge home and Neurosurgery follow up [name].    [name] Plan, MD 03/27/19 [phone]

## 2019-03-27 NOTE — ED Notes (Signed)
EDP at bedside updating patient and family. 

## 2019-03-27 NOTE — ED Triage Notes (Signed)
Patient states that she was trying to get out of bed when she fell and hit her head on the night stand. Patient had no loss of consciousness. Patient complaining of right arm pain with no obvious deformity. Patient also complaining of right lower quad pain from the fall. Patient denies headache or head pain.

## 2019-03-27 NOTE — ED Provider Notes (Signed)
[facility] EMERGENCY DEPARTMENT Provider Note   CSN: [id number] Arrival date & time: 03/27/19  2536    History   Chief Complaint Chief Complaint  Patient presents with  . Fall    HPI [name] [name] is a 78 y.o. female.     Patient presents by EMS after falling at home.  States she was trying to get out of bed to get to the bathroom when she stumbled and fell onto her right side striking her head on a wooden nightstand.  There is no loss of consciousness.  She complains of pain to her right hip and right abdomen.  Denies any neck pain, head pain or back pain.  She had a cervical spine surgery in January with no issues from that.  She denies any new numbness, weakness, tingling, bowel or bladder incontinence.  No chest pain or shortness of breath.  Complains of pain to her right hip and right abdomen.  Contrary to triage note she denies any right arm pain for me.  Denies any preceding dizziness or lightheadedness.  She does not take any blood thinners.  The history is provided by the patient and the EMS personnel.  Fall  Associated symptoms include headaches. Pertinent negatives include no chest pain, no abdominal pain and no shortness of breath.    Past Medical History:  Diagnosis Date  . Anxiety   . Bowel obstruction ([place])   . Burning with urination 01/04/2016  . Diabetes mellitus without complication ([place])   . Genital herpes   . GERD (gastroesophageal reflux disease)   . Hematuria 01/04/2016  . Herpes 12/22/2015  . Hyperlipidemia   . Hypertension   . LLQ abdominal tenderness 01/04/2016  . Neuropathy   . Pelvic pressure in female 01/04/2016  . Recurrent UTI   . Sciatic nerve disease   . Urinary frequency 01/04/2016  . Urticaria     Patient Active Problem List   Diagnosis Date Noted  . Cord compression ([place]) 12/15/2018  . Small bowel obstruction ([place]) 04/27/2016  . Non-insulin dependent type 2 diabetes mellitus ([place]) 04/27/2016  . Depression 04/27/2016  . Anxiety  state 04/27/2016  . GERD (gastroesophageal reflux disease) 04/27/2016  . SBO (small bowel obstruction) ([place]) 04/27/2016  . Generalized weakness 02/03/2016  . Essential hypertension 02/03/2016  . Urinary frequency 01/04/2016  . Pelvic pressure in female 01/04/2016  . Hematuria 01/04/2016  . Herpes 12/22/2015    Past Surgical History:  Procedure Laterality Date  . ABDOMINAL HYSTERECTOMY    . APPLICATION OF INTRAOPERATIVE CT SCAN N/A 12/19/2018   Procedure: APPLICATION OF INTRAOPERATIVE CT SCAN;  Surgeon: Erline Levine, MD;  Location: [place];  Service: Neurosurgery;  Laterality: N/A;  . bowel obstruction    . COLON SURGERY     blocked colon  . POSTERIOR CERVICAL FUSION/FORAMINOTOMY N/A 12/19/2018   Procedure: Cervical Seven to Thoracic One Posterior cervical fusion;  Surgeon: Erline Levine, MD;  Location: [place];  Service: Neurosurgery;  Laterality: N/A;  Cervical Seven to Thoracic One Posterior cervical fusion     OB History    Gravida  3   Para  3   Term      Preterm      AB      Living  3     SAB      TAB      Ectopic      Multiple      Live Births  Home Medications    Prior to Admission medications   Medication Sig Start Date End Date Taking? Authorizing Provider  acetaminophen (TYLENOL) 325 MG tablet Take 2 tablets (650 mg total) by mouth every 6 (six) hours as needed for mild pain, moderate pain, fever or headache. 12/22/18   Carroll Sage, MD  aspirin EC 81 MG tablet Take 81 mg by mouth daily.    [provider]  bisacodyl (DULCOLAX) 10 MG suppository Place 1 suppository (10 mg total) rectally daily as needed for moderate constipation. 12/22/18   Carroll Sage, MD  DHA-Vitamin C-Lutein (EYE HEALTH FORMULA PO) Take 1 capsule by mouth daily.    [provider]  DULoxetine (CYMBALTA) 30 MG capsule Take 30 mg by mouth daily.    [provider]  Emollient (EUCERIN) lotion Apply topically 4 (four) times daily as needed  for dry skin.    [provider]  EPINEPHrine (EPIPEN 2-PAK) 0.3 mg/0.3 mL IJ SOAJ injection Inject 0.3 mg into the muscle once.    [provider]  fexofenadine (ALLEGRA) 180 MG tablet Take 180 mg by mouth daily.    [provider]  hydrOXYzine (ATARAX/VISTARIL) 10 MG tablet Take 1 tablet (10 mg total) by mouth daily as needed for anxiety. 12/22/18   Carroll Sage, MD  labetalol (NORMODYNE) 100 MG tablet Take 1 tablet (100 mg total) by mouth 2 (two) times daily. 12/22/18   Carroll Sage, MD  lamoTRIgine (LAMICTAL) 25 MG tablet Take 50 mg by mouth 2 (two) times daily.     [provider]  Menthol, Topical Analgesic, (BIOFREEZE ROLL-ON COLORLESS) 4 % GEL Apply topically.    [provider]  montelukast (SINGULAIR) 10 MG tablet Take 1 tablet (10 mg total) by mouth at bedtime. 08/22/18   Kozlow, Donnamarie Poag, MD  nystatin (NYSTATIN) powder Apply topically 3 (three) times daily.     [provider]  pantoprazole (PROTONIX) 40 MG tablet Take 40 mg by mouth every morning.    [provider]  polyethylene glycol (MIRALAX / GLYCOLAX) packet Take 17 g by mouth daily.    [provider]  senna-docusate (SENOKOT-S) 8.6-50 MG tablet Take 2 tablets by mouth at bedtime.    [provider]  simvastatin (ZOCOR) 10 MG tablet Take 10 mg by mouth daily.     [provider]  Sodium Fluoride (PREVIDENT 5000 BOOSTER) 1.1 % PSTE Place 1 application onto teeth daily.    [provider]  triamcinolone (NASACORT ALLERGY 24HR) 55 MCG/ACT AERO nasal inhaler Place 1 spray into the nose daily.    [provider]  valACYclovir (VALTREX) 1000 MG tablet Take 1,000 mg by mouth daily.  05/05/16   [provider]  gabapentin (NEURONTIN) 300 MG capsule Take 300 mg by mouth 2 (two) times daily. Reported on 02/03/2016  04/18/16  [provider]    Family History Family History  Problem Relation Age of Onset  .  Diabetes Mother   . Heart disease Brother   . Hypertension Brother   . Diabetes Brother   . Hypertension Daughter   . Diabetes Brother   . Diabetes Brother   . Alzheimer's disease Brother   . Anxiety disorder Daughter     Social History Social History   Tobacco Use  . Smoking status: Never Smoker  . Smokeless tobacco: Current User    Types: Snuff  Substance Use Topics  . Alcohol use: No  . Drug use: No  Allergies   Penicillins and Sulfa antibiotics   Review of Systems Review of Systems  Constitutional: Negative for activity change, appetite change and fever.  HENT: Negative for congestion and rhinorrhea.   Eyes: Negative for visual disturbance.  Respiratory: Negative for chest tightness and shortness of breath.   Cardiovascular: Negative for chest pain.  Gastrointestinal: Negative for abdominal pain, nausea and vomiting.  Genitourinary: Negative for dysuria and hematuria.  Musculoskeletal: Positive for arthralgias, back pain and myalgias. Negative for neck pain.  Skin: Negative for rash.  Neurological: Positive for headaches. Negative for dizziness, weakness and light-headedness.     all other systems are negative except as noted in the HPI and PMH.   Physical Exam Updated Vital Signs BP (!) 152/70   Pulse 78   Temp 97.8 F (36.6 C) (Oral)   Resp 18   Ht 5' (1.524 m)   Wt 87.4 kg   SpO2 95%   BMI 37.63 kg/m   Physical Exam Vitals signs and nursing note reviewed.  Constitutional:      General: She is not in acute distress.    Appearance: She is well-developed. She is obese.  HENT:     Head: Normocephalic and atraumatic.     Mouth/Throat:     Pharynx: No oropharyngeal exudate.  Eyes:     Conjunctiva/sclera: Conjunctivae normal.     Pupils: Pupils are equal, round, and reactive to light.  Neck:     Musculoskeletal: Normal range of motion and neck supple.     Comments: Well-healed midline cervical incision.  There is no midline tenderness or  step-offs. Cardiovascular:     Rate and Rhythm: Normal rate and regular rhythm.     Heart sounds: Normal heart sounds. No murmur.  Pulmonary:     Effort: Pulmonary effort is normal. No respiratory distress.     Breath sounds: Normal breath sounds.  Abdominal:     Palpations: Abdomen is soft.     Tenderness: There is abdominal tenderness. There is no guarding or rebound.     Comments: Obese abdomen, mild right-sided abdominal tenderness without guarding or rebound  Musculoskeletal: Normal range of motion.        General: Tenderness present. No swelling or deformity.     Comments: Paraspinal thoracic tenderness bilaterally, no midline tenderness  Small ecchymosis over right anterior lateral hip.  There is no shortening or external rotation.  Intact DP pulse.  Patient able to flex and extend hip without difficulty  Skin:    General: Skin is warm.     Capillary Refill: Capillary refill takes less than 2 seconds.  Neurological:     General: No focal deficit present.     Mental Status: She is alert and oriented to person, place, and time. Mental status is at baseline.     Cranial Nerves: No cranial nerve deficit.     Motor: No abnormal muscle tone.     Coordination: Coordination normal.     Comments:  5/5 strength throughout. CN 2-12 intact.Equal grip strength.  Great toe extension intact bilaterally, ankle plantar flexion extension intact. Distal sensation intact  Psychiatric:        Behavior: Behavior normal.      ED Treatments / Results  Labs (all labs ordered are listed, but only abnormal results are displayed) Labs Reviewed  CBC WITH DIFFERENTIAL/PLATELET - Abnormal; Notable for the following components:      Result Value   MCH 25.6 (*)    Monocytes Absolute 1.1 (*)  All other components within normal limits  COMPREHENSIVE METABOLIC PANEL - Abnormal; Notable for the following components:   Potassium 3.4 (*)    Chloride 96 (*)    Glucose, Bld 286 (*)    All other  components within normal limits  LACTIC ACID, PLASMA - Abnormal; Notable for the following components:   Lactic Acid, Venous 2.6 (*)    All other components within normal limits  URINALYSIS, ROUTINE W REFLEX MICROSCOPIC - Abnormal; Notable for the following components:   Color, Urine STRAW (*)    Glucose, UA >=500 (*)    Leukocytes,Ua SMALL (*)    Bacteria, UA RARE (*)    All other components within normal limits  CBG MONITORING, ED - Abnormal; Notable for the following components:   Glucose-Capillary 275 (*)    All other components within normal limits  CULTURE, BLOOD (ROUTINE X 2)  CULTURE, BLOOD (ROUTINE X 2)  SEDIMENTATION RATE  LACTIC ACID, PLASMA  C-REACTIVE PROTEIN  I-STAT CREATININE, ED    EKG None  Radiology Dg Chest 2 View  Result Date: 03/27/2019 CLINICAL DATA:  Fall today.  Hypertension and diabetes. EXAM: CHEST - 2 VIEW COMPARISON:  One-view chest x-ray 12/14/2018 FINDINGS: The heart size is upper limits of normal, exaggerated by low lung volumes. There is no edema or effusion. Ill-defined left lower lobe airspace disease is present. No other significant airspace disease is evident. The visualized soft tissues and bony thorax are unremarkable. IMPRESSION: 1. Low lung volumes. 2. Subtle left lower lobe airspace disease. While this may reflect atelectasis, infection is not excluded. Electronically Signed   By: San Morelle M.D.   On: 03/27/2019 04:27   Ct Head Wo Contrast  Result Date: 03/27/2019 CLINICAL DATA:  Fall. Hit head on nightstand. No loss of consciousness. Right arm pain. Poly trauma, critical, head and cervical spine injury suspected. EXAM: CT HEAD WITHOUT CONTRAST CT CERVICAL SPINE WITHOUT CONTRAST TECHNIQUE: Multidetector CT imaging of the head and cervical spine was performed following the standard protocol without intravenous contrast. Multiplanar CT image reconstructions of the cervical spine were also generated. COMPARISON:  MRI of the cervical  spine 03/02/2011. MRI of the thoracic spine 12/14/2018 FINDINGS: CT HEAD FINDINGS Brain: Mild atrophy and white matter changes are present bilaterally. No acute infarct, hemorrhage, or mass lesion is present. The ventricles are of proportionate to the degree of atrophy. No significant extraaxial fluid collection is present. The brainstem and cerebellum are within normal limits. Vascular: Atherosclerotic changes are noted within the cavernous internal carotid arteries bilaterally. There is some calcification at the dural margin of the left vertebral artery. No hyperdense vessel is present. Skull: Calvarium is intact. No focal lytic or blastic lesions are present. No significant extracranial soft tissue injury is present. Sinuses/Orbits: The paranasal sinuses and mastoid air cells are clear. Bilateral lens replacements are noted. Globes and orbits are otherwise unremarkable. CT CERVICAL SPINE FINDINGS Alignment: Slight degenerative anterolisthesis is present at C4-5 and C6-7. Skull base and vertebrae: Degenerative changes are present at C1-2. Ill-defined hypoattenuation is present at C4 there is also some irregular lucency at the inferior left endplate of C6. Advanced sclerotic changes are present at T1-2. There is significant lucency surrounding the left pedicle screw of T2 and visualized portions of the right pedicle screw at T2 No acute fracture is present. Soft tissues and spinal canal: A prominent fluid collection present in the posterior neck measuring 5.5 x 4.3 x 6.0 cm. This is centered at T1 and extends to the level  of the T2 laminectomy and hardware. Anterior soft tissues are within normal limits. 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 Fusion at C4-5 may be congenital or surgical. Fusion at C7-T1 appears to be acquired. There is surgical fusion of posterior elements at T1-2. Vertebral bodies are fused at T2-3 and T3-4. Osseous foraminal narrowing is present on  the right at C3-4, C4-5, C7-T1, and T1-2 most notably. Left-sided foraminal narrowing is most pronounced at C3-4, C4-5, and C6-7. There is marked osseous foraminal narrowing on the left at T3-4. Previously noted stenosis at T1-2 is decompressed. Upper chest: The lung apices demonstrate some ground-glass attenuation without focal nodule or mass lesion. IMPRESSION: 1. No acute trauma to the head or cervical spine. 2. Mild atrophy and white matter disease without acute intracranial abnormality. 3. Atherosclerosis. 4. Large fluid collection the posterior neck associated with fusion laminectomy in the upper thoracic spine. This is concerning for infection versus large seroma. The patient did have surgery 3 months ago. The collection is larger than would be expected at this point. 5. Lucency about the left pedicle screw at T2 with nonunion across the disc space at T1-2 and marked sclerosis. 6. Irregular hypoattenuation involving C4 and C6. There was no focal lesion on the MRI 3 months ago. This raises concern for infection. 7. MRI of the cervical spine without and with contrast is recommended for further evaluation of infection or metastatic disease. These results were called by telephone at the time of interpretation on 03/27/2019 at 4:50 am to Dr. Ezequiel Essex , who verbally acknowledged these results. Electronically Signed   By: San Morelle M.D.   On: 03/27/2019 04:51   Ct Cervical Spine Wo Contrast  Result Date: 03/27/2019 CLINICAL DATA:  Fall. Hit head on nightstand. No loss of consciousness. Right arm pain. Poly trauma, critical, head and cervical spine injury suspected. EXAM: CT HEAD WITHOUT CONTRAST CT CERVICAL SPINE WITHOUT CONTRAST TECHNIQUE: Multidetector CT imaging of the head and cervical spine was performed following the standard protocol without intravenous contrast. Multiplanar CT image reconstructions of the cervical spine were also generated. COMPARISON:  MRI of the cervical spine  03/02/2011. MRI of the thoracic spine 12/14/2018 FINDINGS: CT HEAD FINDINGS Brain: Mild atrophy and white matter changes are present bilaterally. No acute infarct, hemorrhage, or mass lesion is present. The ventricles are of proportionate to the degree of atrophy. No significant extraaxial fluid collection is present. The brainstem and cerebellum are within normal limits. Vascular: Atherosclerotic changes are noted within the cavernous internal carotid arteries bilaterally. There is some calcification at the dural margin of the left vertebral artery. No hyperdense vessel is present. Skull: Calvarium is intact. No focal lytic or blastic lesions are present. No significant extracranial soft tissue injury is present. Sinuses/Orbits: The paranasal sinuses and mastoid air cells are clear. Bilateral lens replacements are noted. Globes and orbits are otherwise unremarkable. CT CERVICAL SPINE FINDINGS Alignment: Slight degenerative anterolisthesis is present at C4-5 and C6-7. Skull base and vertebrae: Degenerative changes are present at C1-2. Ill-defined hypoattenuation is present at C4 there is also some irregular lucency at the inferior left endplate of C6. Advanced sclerotic changes are present at T1-2. There is significant lucency surrounding the left pedicle screw of T2 and visualized portions of the right pedicle screw at T2 No acute fracture is present. Soft tissues and spinal canal: A prominent fluid collection present in the posterior neck measuring 5.5 x 4.3 x 6.0 cm. This is centered at  T1 and extends to the level of the T2 laminectomy and hardware. Anterior soft tissues are within normal limits. 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 Fusion at C4-5 may be congenital or surgical. Fusion at C7-T1 appears to be acquired. There is surgical fusion of posterior elements at T1-2. Vertebral bodies are fused at T2-3 and T3-4. Osseous foraminal narrowing is present on the  right at C3-4, C4-5, C7-T1, and T1-2 most notably. Left-sided foraminal narrowing is most pronounced at C3-4, C4-5, and C6-7. There is marked osseous foraminal narrowing on the left at T3-4. Previously noted stenosis at T1-2 is decompressed. Upper chest: The lung apices demonstrate some ground-glass attenuation without focal nodule or mass lesion. IMPRESSION: 1. No acute trauma to the head or cervical spine. 2. Mild atrophy and white matter disease without acute intracranial abnormality. 3. Atherosclerosis. 4. Large fluid collection the posterior neck associated with fusion laminectomy in the upper thoracic spine. This is concerning for infection versus large seroma. The patient did have surgery 3 months ago. The collection is larger than would be expected at this point. 5. Lucency about the left pedicle screw at T2 with nonunion across the disc space at T1-2 and marked sclerosis. 6. Irregular hypoattenuation involving C4 and C6. There was no focal lesion on the MRI 3 months ago. This raises concern for infection. 7. MRI of the cervical spine without and with contrast is recommended for further evaluation of infection or metastatic disease. These results were called by telephone at the time of interpretation on 03/27/2019 at 4:50 am to Dr. Ezequiel Essex , who verbally acknowledged these results. Electronically Signed   By: San Morelle M.D.   On: 03/27/2019 04:51   Dg Hip Unilat W Or Wo Pelvis 2-3 Views Right  Result Date: 03/27/2019 CLINICAL DATA:  Fall today.  Right hip pain. EXAM: DG HIP (WITH OR WITHOUT PELVIS) 2-3V RIGHT COMPARISON:  None. FINDINGS: Degenerative changes are present at the greater trochanter. No discrete fracture is present. The hip is located. Pelvis is unremarkable. Degenerative changes are noted in the lower lumbar spine. IMPRESSION: 1. No acute fracture. 2. Degenerative changes of the hips bilaterally. Electronically Signed   By: San Morelle M.D.   On: 03/27/2019 04:26     Procedures Procedures (including critical care time)  Medications Ordered in ED Medications - No data to display   Initial Impression / Assessment and Plan / ED Course  I have reviewed the triage vital signs and the nursing notes.  Pertinent labs & imaging results that were available during my care of the patient were reviewed by me and considered in my medical decision making (see chart for details).       Fall with right-sided hip and abdominal pain.  Denies any head or neck pain. However imaging will be obtained given her recent surgery. Equal strength and sensation throughout.  Hip x-ray is negative.  CT head is negative.  CT C-spine discussed with Dr. Jobe Igo of radiology.  There is a large postoperative fluid collection which is concerning for infection with some loosening of pedicle screws as well. Also hypoattenuation of vertebral bodies concerning for infection. This is larger than expected 3 months postop. He recommends MRI as well as discussion with neurosurgery.  CT C-spine findings discussed with Jinny Blossom NP for neurosurgery covering for Dr. Vertell Limber.  She states Dr. Trenton Gammon reviewed images and feels patient can follow-up as an outpatient with Dr. Vertell Limber.  Does recommend maintaining cervical collar.  Does also recommend MRI.  Does not feel patient needs to be transferred to [facility] unless MRI is abnormal.  Patient has no new neurological deficits and no fever or leukocytosis to suggest infection.  Lactate is elevated and patient will be hydrated and blood and urine culture obtained.  Imaging of R hip and abdomen is reassuring. UA pending to evaluate kidney abnormality.  MRI pending at time of shift change. Also awaiting repeat lactate and UA. Labs show no leukocytosis. ESR normal. CRP and blood cultures sent.  Patient remains afebrile with no new neuro deficits.  Dr. Laverta Baltimore to assume care at shift change.  Final Clinical Impressions(s) / ED Diagnoses   Final diagnoses:  Fall,  initial encounter  Postoperative seroma involving nervous system after nervous system procedure  Right hip pain    ED Discharge Orders    None       Wilmary Levit, Annie Main, MD 03/27/19 0800

## 2019-03-27 NOTE — ED Notes (Signed)
Spoke with pt's daughter, she is coming to pick her up.  Pt and daughter verbalized understanding of d/c instructions.

## 2019-04-01 LAB — CULTURE, BLOOD (ROUTINE X 2)
Culture: NO GROWTH
Culture: NO GROWTH
Special Requests: ADEQUATE
Special Requests: ADEQUATE

## 2019-06-18 ENCOUNTER — Emergency Department (HOSPITAL_COMMUNITY)
Admission: EM | Admit: 2019-06-18 | Discharge: 2019-06-18 | Disposition: A | Discharge status: Home / Self Care | Payer: Medicare Other | Attending: Emergency Medicine | Admitting: Emergency Medicine

## 2019-06-18 ENCOUNTER — Emergency Department (HOSPITAL_COMMUNITY): Payer: Medicare Other

## 2019-06-18 ENCOUNTER — Other Ambulatory Visit: Payer: Self-pay

## 2019-06-18 ENCOUNTER — Encounter (HOSPITAL_COMMUNITY): Payer: Self-pay | Admitting: Emergency Medicine

## 2019-06-18 DIAGNOSIS — R197 Diarrhea, unspecified: Secondary | ICD-10-CM | POA: Insufficient documentation

## 2019-06-18 DIAGNOSIS — I10 Essential (primary) hypertension: Secondary | ICD-10-CM | POA: Insufficient documentation

## 2019-06-18 DIAGNOSIS — R61 Generalized hyperhidrosis: Secondary | ICD-10-CM | POA: Insufficient documentation

## 2019-06-18 DIAGNOSIS — Z20828 Contact with and (suspected) exposure to other viral communicable diseases: Secondary | ICD-10-CM | POA: Diagnosis not present

## 2019-06-18 DIAGNOSIS — E119 Type 2 diabetes mellitus without complications: Secondary | ICD-10-CM | POA: Diagnosis not present

## 2019-06-18 LAB — COMPREHENSIVE METABOLIC PANEL
ALT: 18 U/L (ref 0–44)
AST: 19 U/L (ref 15–41)
Albumin: 4.6 g/dL (ref 3.5–5.0)
Alkaline Phosphatase: 81 U/L (ref 38–126)
Anion gap: 16 — ABNORMAL HIGH (ref 5–15)
BUN: 16 mg/dL (ref 8–23)
CO2: 23 mmol/L (ref 22–32)
Calcium: 9.4 mg/dL (ref 8.9–10.3)
Chloride: 98 mmol/L (ref 98–111)
Creatinine, Ser: 1.2 mg/dL — ABNORMAL HIGH (ref 0.44–1.00)
GFR calc Af Amer: 50 mL/min — ABNORMAL LOW (ref >60)
GFR calc non Af Amer: 44 mL/min — ABNORMAL LOW (ref >60)
Glucose, Bld: 118 mg/dL — ABNORMAL HIGH (ref 70–99)
Potassium: 4.7 mmol/L (ref 3.5–5.1)
Sodium: 137 mmol/L (ref 135–145)
Total Bilirubin: 0.3 mg/dL (ref 0.3–1.2)
Total Protein: 7.8 g/dL (ref 6.5–8.1)

## 2019-06-18 LAB — URINALYSIS, ROUTINE W REFLEX MICROSCOPIC
Bilirubin Urine: NEGATIVE
Glucose, UA: NEGATIVE mg/dL
Hgb urine dipstick: NEGATIVE
Ketones, ur: NEGATIVE mg/dL
Leukocytes,Ua: NEGATIVE
Nitrite: NEGATIVE
Protein, ur: NEGATIVE mg/dL
Specific Gravity, Urine: 1.015 (ref 1.005–1.030)
pH: 5 (ref 5.0–8.0)

## 2019-06-18 LAB — CBC
HCT: 43.7 % (ref 36.0–46.0)
Hemoglobin: 13 g/dL (ref 12.0–15.0)
MCH: 25.9 pg — ABNORMAL LOW (ref 26.0–34.0)
MCHC: 29.7 g/dL — ABNORMAL LOW (ref 30.0–36.0)
MCV: 87.2 fL (ref 80.0–100.0)
Platelets: 235 10*3/uL (ref 150–400)
RBC: 5.01 MIL/uL (ref 3.87–5.11)
RDW: 15.8 % — ABNORMAL HIGH (ref 11.5–15.5)
WBC: 11.1 10*3/uL — ABNORMAL HIGH (ref 4.0–10.5)
nRBC: 0 % (ref 0.0–0.2)

## 2019-06-18 LAB — LIPASE, BLOOD: Lipase: 50 U/L (ref 11–51)

## 2019-06-18 LAB — SARS CORONAVIRUS 2 BY RT PCR (HOSPITAL ORDER, PERFORMED IN ~~LOC~~ HOSPITAL LAB): SARS Coronavirus 2: NEGATIVE

## 2019-06-18 NOTE — ED Triage Notes (Signed)
Nausea, diarrhea, cold then hot for past 2 days.  Denies having a fever

## 2019-06-18 NOTE — ED Notes (Signed)
Pt is advised a urine sample is needed. She does not need to use the restroom at this time.

## 2019-06-18 NOTE — ED Notes (Signed)
Pt stated she still does not need to use the restroom at this time.

## 2019-06-18 NOTE — Discharge Instructions (Addendum)
You were evaluated in the Emergency Department and after careful evaluation, we did not find any emergent condition requiring admission or further testing in the hospital.  Your testing today was reassuring.  There is no evidence of pneumonia or bladder infection and your blood testing was normal.  Please return to the Emergency Department if you experience any worsening of your condition.  We encourage you to follow up with a primary care provider.  Thank you for allowing [place] to be a part of your care.

## 2019-06-18 NOTE — ED Notes (Signed)
Report given to Supervisor at [facility], states that she would send someone right over to pick up the patient, discharge instructions reviewed and acknowledged with Supervisor.

## 2019-06-18 NOTE — ED Provider Notes (Signed)
[facility] Emergency Department Provider Note MRN:  [id number]  Arrival date & time: 06/18/19     Chief Complaint   Diarrhea   History of Present Illness   [name] [name] is a 78 y.o. year-old female with a history of diabetes presenting to the ED with chief complaint of diarrhea.  2 days of watery diarrhea, multiple episodes.  Has been having night sweats and chills for the past 2 nights.  Denies chest pain, no abdominal pain, no headache.  Denies vision change, no shortness of breath, no cough, no dysuria.  Unsure if she has had recent antibiotics.  Currently without symptoms, no diarrhea since she arrived in the emergency department 3 hours ago.  Review of Systems  A complete 10 system review of systems was obtained and all systems are negative except as noted in the HPI and PMH.   Patient's Health History    Past Medical History:  Diagnosis Date  . Anxiety   . Bowel obstruction (HCC)   . Burning with urination 01/04/2016  . Diabetes mellitus without complication (HCC)   . Genital herpes   . GERD (gastroesophageal reflux disease)   . Hematuria 01/04/2016  . Herpes 12/22/2015  . Hyperlipidemia   . Hypertension   . LLQ abdominal tenderness 01/04/2016  . Neuropathy   . Pelvic pressure in female 01/04/2016  . Recurrent UTI   . Sciatic nerve disease   . Urinary frequency 01/04/2016  . Urticaria     Past Surgical History:  Procedure Laterality Date  . ABDOMINAL HYSTERECTOMY    . APPLICATION OF INTRAOPERATIVE CT SCAN N/A 12/19/2018   Procedure: APPLICATION OF INTRAOPERATIVE CT SCAN;  Surgeon: Maeola HarmanStern, Joseph, MD;  [facility];  Service: Neurosurgery;  Laterality: N/A;  . bowel obstruction    . COLON SURGERY     blocked colon  . POSTERIOR CERVICAL FUSION/FORAMINOTOMY N/A 12/19/2018   Procedure: Cervical Seven to Thoracic One Posterior cervical fusion;  Surgeon: Maeola HarmanStern, Joseph, MD;  [facility];  Service: Neurosurgery;  Laterality: N/A;  Cervical Seven to  Thoracic One Posterior cervical fusion    Family History  Problem Relation Age of Onset  . Diabetes Mother   . Heart disease Brother   . Hypertension Brother   . Diabetes Brother   . Hypertension Daughter   . Diabetes Brother   . Diabetes Brother   . Alzheimer's disease Brother   . Anxiety disorder Daughter     Social History   Socioeconomic History  . Marital status: Single    Spouse name: Not on file  . Number of children: Not on file  . Years of education: Not on file  . Highest education level: Not on file  Occupational History  . Not on file  Social Needs  . Financial resource strain: Not on file  . Food insecurity    Worry: Not on file    Inability: Not on file  . Transportation needs    Medical: Not on file    Non-medical: Not on file  Tobacco Use  . Smoking status: Never Smoker  . Smokeless tobacco: Current User    Types: Snuff  Substance and Sexual Activity  . Alcohol use: No  . Drug use: No  . Sexual activity: Never    Birth control/protection: Surgical    Comment: hyst  Lifestyle  . Physical activity    Days per week: Not on file    Minutes per session: Not on file  . Stress: Not on  file  Relationships  . Social Herbalist on phone: Not on file    Gets together: Not on file    Attends religious service: Not on file    Active member of club or organization: Not on file    Attends meetings of clubs or organizations: Not on file    Relationship status: Not on file  . Intimate partner violence    Fear of current or ex partner: Not on file    Emotionally abused: Not on file    Physically abused: Not on file    Forced sexual activity: Not on file  Other Topics Concern  . Not on file  Social History Narrative  . Not on file     Physical Exam  Vital Signs and Nursing Notes reviewed Vitals:   06/18/19 1745 06/18/19 1800  BP:  (!) 169/81  Pulse: 82 81  Resp:    Temp:    SpO2: 99% 95%    CONSTITUTIONAL: Well-appearing, NAD NEURO:   Alert and oriented x 3, no focal deficits EYES:  eyes equal and reactive ENT/NECK:  no LAD, no JVD CARDIO: Regular rate, well-perfused, normal S1 and S2 PULM:  CTAB no wheezing or rhonchi GI/GU:  normal bowel sounds, non-distended, non-tender MSK/SPINE:  No gross deformities, no edema SKIN:  no rash, atraumatic PSYCH:  Appropriate speech and behavior  Diagnostic and Interventional Summary    Labs Reviewed  COMPREHENSIVE METABOLIC PANEL - Abnormal; Notable for the following components:      Result Value   Glucose, Bld 118 (*)    Creatinine, Ser 1.20 (*)    GFR calc non Af Amer 44 (*)    GFR calc Af Amer 50 (*)    Anion gap 16 (*)    All other components within normal limits  CBC - Abnormal; Notable for the following components:   WBC 11.1 (*)    MCH 25.9 (*)    MCHC 29.7 (*)    RDW 15.8 (*)    All other components within normal limits  SARS CORONAVIRUS 2 (HOSPITAL ORDER, [place] [facility])  LIPASE, BLOOD  URINALYSIS, ROUTINE W REFLEX MICROSCOPIC    DG Chest Port 1 View  Final Result      Medications - No data to display   Procedures Critical Care  ED Course and Medical Decision Making  I have reviewed the triage vital signs and the nursing notes.  Pertinent labs & imaging results that were available during my care of the patient were reviewed by me and considered in my medical decision making (see below for details).  Night sweats and chills in this 78 year old female, question of recent UTI treated with antibiotics.  Also endorsing diarrhea, raising concern for possible C. difficile or other infectious process.  Patient is with normal vital signs here currently, asymptomatic, infectious work-up pending.  Work-up is unrevealing, normal labs, no pneumonia on chest x-ray, normal urinalysis.  I doubt C. difficile given that during patient's 7-hour ED stay she had no bowel movements.  During this prolonged ED visit, she exhibited normal vital signs the  entire time with no signs of sepsis.  She is appropriate for outpatient follow-up.  After the discussed management above, the patient was determined to be safe for discharge.  The patient was in agreement with this plan and all questions regarding their care were answered.  ED return precautions were discussed and the patient will return to the ED with any significant worsening of  condition.  Elmer SowMichael M. Pilar PlateBero, MD [facility] [email or web address]  Final Clinical Impressions(s) / ED Diagnoses     ICD-10-CM   1. Diarrhea, unspecified type  R19.7   2. Night sweats  R61 [facility]    [facility]    ED Discharge Orders    None         Sabas SousBero, Sherlene Rickel M, MD 06/18/19 2025

## 2019-07-08 ENCOUNTER — Telehealth: Payer: Self-pay | Admitting: Obstetrics and Gynecology

## 2019-07-08 NOTE — Telephone Encounter (Signed)
Unable to reach pt with restrictions.  

## 2019-07-09 ENCOUNTER — Other Ambulatory Visit: Payer: Self-pay

## 2019-07-09 ENCOUNTER — Ambulatory Visit (INDEPENDENT_AMBULATORY_CARE_PROVIDER_SITE_OTHER): Payer: Medicare Other | Admitting: Obstetrics and Gynecology

## 2019-07-09 ENCOUNTER — Encounter: Payer: Self-pay | Admitting: Obstetrics and Gynecology

## 2019-07-09 VITALS — BP 162/80 | HR 74 | Ht 60.0 in | Wt 180.4 lb

## 2019-07-09 DIAGNOSIS — R300 Dysuria: Secondary | ICD-10-CM

## 2019-07-09 LAB — POCT URINALYSIS DIPSTICK OB
Blood, UA: NEGATIVE
Glucose, UA: NEGATIVE
Ketones, UA: NEGATIVE
Nitrite, UA: POSITIVE
POC,PROTEIN,UA: NEGATIVE

## 2019-07-09 NOTE — Progress Notes (Signed)
[facility] Visit  @DATE @            Patient name: [name] [name] MRN [id number]  Date of birth: 1941-07-11  CC & HPI:  [name] [name] is a 78 y.o. female presenting today for f/u urinary frequency and bladder pressure.  She had a UTI earlier this month treated with Septra, trimethoprim/sulfamethoxazole at her nursing facility.  I review her allergy states that she cannot take Septra due to nausea and vomiting but she obviously took it x5 days in early August She has developed recurrent urinary tract symptoms again.  She had nocturia x4 last night up from her usual 2 times per night. She is here with her assistant from the nursing facility at [facility]  ROS:  ROS she blames her elevated pressures on anxiety nervousness, BP .vs BP (!) 162/80 (BP Location: Left Arm, Patient Position: Sitting, Cuff Size: Normal)   Pulse 74   Ht 5' (1.524 m)   Wt 180 lb 6.4 oz (81.8 kg)   BMI 35.23 kg/m    Pertinent History Reviewed:   Reviewed: Significant for recurrent UTIs Medical         Past Medical History:  Diagnosis Date  . Anxiety   . Bowel obstruction ([place])   . Burning with urination 01/04/2016  . Diabetes mellitus without complication ([place])   . Genital herpes   . GERD (gastroesophageal reflux disease)   . Hematuria 01/04/2016  . Herpes 12/22/2015  . Hyperlipidemia   . Hypertension   . LLQ abdominal tenderness 01/04/2016  . Neuropathy   . Pelvic pressure in female 01/04/2016  . Recurrent UTI   . Sciatic nerve disease   . Urinary frequency 01/04/2016  . Urticaria                               Surgical Hx:    Past Surgical History:  Procedure Laterality Date  . ABDOMINAL HYSTERECTOMY    . APPLICATION OF INTRAOPERATIVE CT SCAN N/A 12/19/2018   Procedure: APPLICATION OF INTRAOPERATIVE CT SCAN;  Surgeon: Erline Levine, MD;  Location: [place];  Service: Neurosurgery;  Laterality: N/A;  . bowel obstruction    . COLON SURGERY     blocked colon  . POSTERIOR CERVICAL  FUSION/FORAMINOTOMY N/A 12/19/2018   Procedure: Cervical Seven to Thoracic One Posterior cervical fusion;  Surgeon: Erline Levine, MD;  Location: [place];  Service: Neurosurgery;  Laterality: N/A;  Cervical Seven to Thoracic One Posterior cervical fusion   Medications: Reviewed & Updated - see associated section                       Current Outpatient Medications:  .  acetaminophen (TYLENOL) 325 MG tablet, Take 2 tablets (650 mg total) by mouth every 6 (six) hours as needed for mild pain, moderate pain, fever or headache. (Patient taking differently: Take 650 mg by mouth every 8 (eight) hours as needed for mild pain, moderate pain, fever or headache. ), Disp: , Rfl:  .  aspirin EC 81 MG tablet, Take 81 mg by mouth daily., Disp: , Rfl:  .  DHA-Vitamin C-Lutein (EYE HEALTH FORMULA PO), Take 1 tablet by mouth daily. , Disp: , Rfl:  .  DULoxetine (CYMBALTA) 60 MG capsule, Take 60 mg by mouth daily. , Disp: , Rfl:  .  Emollient (EUCERIN) lotion, Apply topically 4 (four) times daily as needed for dry skin.,  Disp: , Rfl:  .  lamoTRIgine (LAMICTAL) 25 MG tablet, Take 50 mg by mouth 2 (two) times daily. , Disp: , Rfl:  .  linagliptin (TRADJENTA) 5 MG TABS tablet, Take 5 mg by mouth daily., Disp: , Rfl:  .  Melatonin 5 MG TABS, Take 5 mg by mouth at bedtime., Disp: , Rfl:  .  metFORMIN (GLUCOPHAGE) 1000 MG tablet, Take 1,000 mg by mouth 2 (two) times daily with a meal. , Disp: , Rfl:  .  montelukast (SINGULAIR) 10 MG tablet, Take 1 tablet (10 mg total) by mouth at bedtime., Disp: 30 tablet, Rfl: 5 .  oxyCODONE (OXY IR/ROXICODONE) 5 MG immediate release tablet, Take 5 mg by mouth at bedtime., Disp: , Rfl:  .  pantoprazole (PROTONIX) 40 MG tablet, Take 40 mg by mouth every morning., Disp: , Rfl:  .  senna-docusate (SENOKOT-S) 8.6-50 MG tablet, Take 1 tablet by mouth at bedtime. , Disp: , Rfl:  .  simvastatin (ZOCOR) 10 MG tablet, Take 10 mg by mouth every evening. , Disp: , Rfl:  .  triamcinolone (NASACORT  ALLERGY 24HR) 55 MCG/ACT AERO nasal inhaler, Place 1 spray into the nose daily., Disp: , Rfl:  .  valACYclovir (VALTREX) 500 MG tablet, Take 500 mg by mouth daily. , Disp: , Rfl:  .  EPINEPHrine (EPIPEN 2-PAK) 0.3 mg/0.3 mL IJ SOAJ injection, Inject 0.3 mg into the muscle once., Disp: , Rfl:  .  fexofenadine (ALLEGRA) 180 MG tablet, Take 180 mg by mouth daily., Disp: , Rfl:  .  loratadine (CLARITIN) 10 MG tablet, Take 10 mg by mouth daily., Disp: , Rfl:  .  Menthol, Topical Analgesic, (BIOFREEZE ROLL-ON COLORLESS) 4 % GEL, Apply 1 application topically daily as needed (FOR PAIN). , Disp: , Rfl:  .  polyethylene glycol (MIRALAX / GLYCOLAX) packet, Take 17 g by mouth daily., Disp: , Rfl:  .  Sodium Fluoride (PREVIDENT 5000 BOOSTER) 1.1 % PSTE, Place 1 application onto teeth daily., Disp: , Rfl:  .  sulfamethoxazole-trimethoprim (BACTRIM DS) 800-160 MG tablet, Take 1 tablet by mouth 2 (two) times daily. 5 day course starting on 06/12/2019, Disp: , Rfl:    Social History: Reviewed -  reports that she has never smoked. Her smokeless tobacco use includes snuff.  Objective Findings:  Vitals: Blood pressure (!) 162/80, pulse 74, height 5' (1.524 m), weight 180 lb 6.4 oz (81.8 kg).  PHYSICAL EXAMINATION General appearance - alert, well appearing, and in no distress, overweight and playful, active Mental status - alert, oriented to person, place, and time Chest -  Heart -  Abdomen - soft, nontender, nondistended, no masses or organomegaly Breasts -  Skin -   PELVIC Not done Urine dipstick shows positive for nitrates and positive for leukocytes.  Micro exam: no.    Assessment & Plan:   A:  1. Recurrent UTI  P:  1. Cipro 500 twice daily x5 days 2.  repeat urine culture in 2 weeks through nursing facility

## 2019-07-09 NOTE — Patient Instructions (Signed)

## 2019-07-10 ENCOUNTER — Telehealth: Payer: Self-pay | Admitting: Obstetrics and Gynecology

## 2019-07-10 DIAGNOSIS — R300 Dysuria: Secondary | ICD-10-CM

## 2019-07-10 LAB — URINALYSIS, ROUTINE W REFLEX MICROSCOPIC
Bilirubin, UA: NEGATIVE
Glucose, UA: NEGATIVE
Ketones, UA: NEGATIVE
Nitrite, UA: NEGATIVE
Protein,UA: NEGATIVE
RBC, UA: NEGATIVE
Specific Gravity, UA: 1.013 (ref 1.005–1.030)
Urobilinogen, Ur: 0.2 mg/dL (ref 0.2–1.0)
pH, UA: 5.5 (ref 5.0–7.5)

## 2019-07-10 LAB — MICROSCOPIC EXAMINATION
Casts: NONE SEEN /lpf
WBC, UA: >30 /hpf — AB (ref 0–5)

## 2019-07-10 LAB — SPECIMEN STATUS REPORT

## 2019-07-10 NOTE — Telephone Encounter (Signed)
Stacy at [facility] where the pt resides is wanting to see if the order for repeat urine culture in one week would be faxed to them or If the pt needed to come back in the office. Phone#: [phone] Fax: [phone]

## 2019-07-11 LAB — URINE CULTURE

## 2019-07-11 LAB — SPECIMEN STATUS REPORT

## 2019-07-11 NOTE — Telephone Encounter (Signed)
Yes an order for repeat urine culture can be sent to them, can you Verbal order it? If not, I am in office Friday

## 2019-07-15 NOTE — Telephone Encounter (Signed)
Order put in epic and faxed to [facility]. The will bring specimen to the lab.

## 2019-07-15 NOTE — Telephone Encounter (Signed)
[name] [name] with [facility] calling to check status on the order being sent. Pt is complaining of low back pain after finishing her antibiotic yesterday.

## 2019-07-15 NOTE — Telephone Encounter (Signed)
Called Stacy back.

## 2019-07-15 NOTE — Addendum Note (Signed)
Addended by: Christiana Pellant A on: 07/15/2019 12:15 PM   Modules accepted: Orders

## 2019-07-17 LAB — URINE CULTURE

## 2019-07-19 ENCOUNTER — Telehealth: Payer: Self-pay | Admitting: Obstetrics and Gynecology

## 2019-07-19 NOTE — Telephone Encounter (Signed)
Pt informed that urine culture f/u was negative.

## 2019-07-23 ENCOUNTER — Telehealth: Payer: Self-pay | Admitting: Obstetrics and Gynecology

## 2019-07-23 DIAGNOSIS — Z87440 Personal history of urinary (tract) infections: Secondary | ICD-10-CM

## 2019-07-23 NOTE — Telephone Encounter (Signed)
Pass along to Dr Glo Herring tomorrow for his thoughts and management

## 2019-07-23 NOTE — Telephone Encounter (Signed)
Stacey with [facility] is wanting to see if they need to do another urine culture on this pt or what the recommendations are from the provider. CB#: [phone]

## 2019-07-23 NOTE — Telephone Encounter (Addendum)
Pt complaining that she is still hurting. Pt finished antibiotic August 2. Recent urine from 8/3 culture didn't show anything that needs med. No fever. Do you have any recommendations or wait for Dr. Glo Herring tomorrow? Thanks!! [place]

## 2019-07-24 ENCOUNTER — Telehealth: Payer: Self-pay | Admitting: Obstetrics and Gynecology

## 2019-07-24 ENCOUNTER — Other Ambulatory Visit: Payer: Self-pay | Admitting: Internal Medicine

## 2019-07-24 DIAGNOSIS — Z1231 Encounter for screening mammogram for malignant neoplasm of breast: Secondary | ICD-10-CM

## 2019-07-24 DIAGNOSIS — N3000 Acute cystitis without hematuria: Secondary | ICD-10-CM

## 2019-07-24 NOTE — Telephone Encounter (Signed)
Patient reported as having uTI sx still.  Repeat urine culture advised. And order placed in Epic.  [facility] Staff to collect , come by office for paperwork, and take to [facility]. Staff advised to involve Dr Legrand Rams, attending of record, in future management.

## 2019-07-24 NOTE — Addendum Note (Signed)
Addended by: Linton Rump on: 07/24/2019 09:13 AM   Modules accepted: Orders

## 2019-07-24 NOTE — Telephone Encounter (Signed)
I have called [facility], recommended that urine culture be collected, and sent . Order for urine culture placed in Epic.  [facility] to collect, come to office for urine culture order slip, and take to [facility] for processing.

## 2019-07-25 NOTE — Telephone Encounter (Signed)
[facility] called to let the office know they brought the urine sample in yesterday for this pt.

## 2019-07-26 ENCOUNTER — Other Ambulatory Visit: Payer: Self-pay | Admitting: Obstetrics and Gynecology

## 2019-07-26 ENCOUNTER — Telehealth: Payer: Self-pay | Admitting: Obstetrics and Gynecology

## 2019-07-26 LAB — URINE CULTURE

## 2019-07-26 MED ORDER — CIPROFLOXACIN HCL 500 MG PO TABS: 500.0000 mg | ORAL_TABLET | Freq: Two times a day (BID) | ORAL | 0 refills | Status: DC

## 2019-07-26 MED ORDER — NITROFURANTOIN MONOHYD MACRO 100 MG PO CAPS: 100.0000 mg | ORAL_CAPSULE | Freq: Two times a day (BID) | ORAL | 0 refills | Status: DC

## 2019-07-26 NOTE — Addendum Note (Signed)
Addended by: Jonnie Kind on: 07/26/2019 05:28 PM   Modules accepted: Orders

## 2019-07-26 NOTE — Progress Notes (Signed)
Cipro called in for recurrent E Coli UTI.  See results note.

## 2019-07-26 NOTE — Telephone Encounter (Signed)
Spoke with Dr. Glo Herring regarding this message and he wants pt to see Dr. Legrand Rams, her PCP regarding her UTI. [facility] aware of this and [facility] advised to cancel appt. [place]

## 2019-07-26 NOTE — Telephone Encounter (Signed)
[facility] called to make pt an appt for her UTIs per family wishes. They are requesting a letter to be sent to [facility] that states this appt is medically necessary for the pt to have. Requesting a call when this is complete. [phone]

## 2019-07-27 ENCOUNTER — Other Ambulatory Visit: Payer: Self-pay | Admitting: Obstetrics and Gynecology

## 2019-07-27 MED ORDER — FLUCONAZOLE 150 MG PO TABS: 150.0000 mg | ORAL_TABLET | ORAL | 0 refills | Status: DC

## 2019-07-27 MED ORDER — MICONAZOLE NITRATE 2 % VA CREA: 1.0000 | TOPICAL_CREAM | Freq: Every day | VAGINAL | Status: DC

## 2019-07-27 NOTE — Progress Notes (Signed)
Pt daughter calls stating pt is having vulvar itching. Rx sent in for Diflucan and topical monistat 7.

## 2019-07-29 ENCOUNTER — Encounter: Payer: Self-pay | Admitting: Obstetrics and Gynecology

## 2019-07-30 ENCOUNTER — Ambulatory Visit: Payer: Medicare (Managed Care) | Admitting: Obstetrics and Gynecology

## 2019-07-30 ENCOUNTER — Telehealth: Payer: Self-pay | Admitting: Obstetrics and Gynecology

## 2019-07-30 NOTE — Telephone Encounter (Signed)
Patient informed we are still not allowing any visitors or children to come in during appointment time unless physical assistance is needed. Asked if has had any exposure to anyone suspected or confirmed of having COVID-19 or if she was experiencing any of the following, to reschedule: fever, cough, shortness of breath, muscle pain, diarrhea, rash, vomiting, abdominal pain, red eye, weakness, bruising, bleeding, joint pain, or a severe headache.  Stated no to all.  Advised to also use the provided hand sanitizer when entering the office and to wear a mask if she has one, if not, we will provide one. Pt verbalized understanding.   ° ° °

## 2019-07-31 ENCOUNTER — Ambulatory Visit (INDEPENDENT_AMBULATORY_CARE_PROVIDER_SITE_OTHER): Payer: Medicare Other | Admitting: Obstetrics and Gynecology

## 2019-07-31 ENCOUNTER — Encounter: Payer: Self-pay | Admitting: Obstetrics and Gynecology

## 2019-07-31 ENCOUNTER — Other Ambulatory Visit: Payer: Self-pay

## 2019-07-31 VITALS — BP 186/86 | HR 100 | Wt 177.6 lb

## 2019-07-31 DIAGNOSIS — Z01419 Encounter for gynecological examination (general) (routine) without abnormal findings: Secondary | ICD-10-CM | POA: Diagnosis not present

## 2019-07-31 NOTE — Progress Notes (Signed)
Patient ID: [name] [name], female   DOB: 1941/06/22, 78 y.o.   MRN: [id number]  Assessment:  F/u uti Small rectocele S/p hysterectomy Plan:  1. pap smear NOT done no further paps needed. 2. return annually or prn 3    Annual mammogram advised after age 27 Subjective:  [name] [name] is a 78 y.o. female [phone] who presents for annual exam. No LMP recorded. Patient has had a hysterectomy. The patient has complaints today of none. Was having constant pelvic pain and UTIs but since been given cipro but switched macrodantin, a have gotten better, would like to continue medication. Please have Dr. Legrand Rams consider medication for constant UTI  The following portions of the patient's history were reviewed and updated as appropriate: allergies, current medications, past family history, past medical history, past social history, past surgical history and problem list. Past Medical History:  Diagnosis Date  . Anxiety   . Bowel obstruction ([place])   . Burning with urination 01/04/2016  . Diabetes mellitus without complication ([place])   . Genital herpes   . GERD (gastroesophageal reflux disease)   . Hematuria 01/04/2016  . Herpes 12/22/2015  . Hyperlipidemia   . Hypertension   . LLQ abdominal tenderness 01/04/2016  . Neuropathy   . Pelvic pressure in female 01/04/2016  . Recurrent UTI   . Sciatic nerve disease   . Urinary frequency 01/04/2016  . Urticaria     Past Surgical History:  Procedure Laterality Date  . ABDOMINAL HYSTERECTOMY    . APPLICATION OF INTRAOPERATIVE CT SCAN N/A 12/19/2018   Procedure: APPLICATION OF INTRAOPERATIVE CT SCAN;  Surgeon: Erline Levine, MD;  Location: [place] [facility];  Service: Neurosurgery;  Laterality: N/A;  . bowel obstruction    . COLON SURGERY     blocked colon  . POSTERIOR CERVICAL FUSION/FORAMINOTOMY N/A 12/19/2018   Procedure: Cervical Seven to Thoracic One Posterior cervical fusion;  Surgeon: Erline Levine, MD;  Location: [facility];  Service: Neurosurgery;  Laterality:  N/A;  Cervical Seven to Thoracic One Posterior cervical fusion     Current Outpatient Medications:  .  acetaminophen (TYLENOL) 325 MG tablet, Take 2 tablets (650 mg total) by mouth every 6 (six) hours as needed for mild pain, moderate pain, fever or headache. (Patient taking differently: Take 650 mg by mouth every 8 (eight) hours as needed for mild pain, moderate pain, fever or headache. ), Disp: , Rfl:  .  aspirin EC 81 MG tablet, Take 81 mg by mouth daily., Disp: , Rfl:  .  DHA-Vitamin C-Lutein (EYE HEALTH FORMULA PO), Take 1 tablet by mouth daily. , Disp: , Rfl:  .  DULoxetine (CYMBALTA) 60 MG capsule, Take 60 mg by mouth daily. , Disp: , Rfl:  .  Emollient (EUCERIN) lotion, Apply topically 4 (four) times daily as needed for dry skin., Disp: , Rfl:  .  EPINEPHrine (EPIPEN 2-PAK) 0.3 mg/0.3 mL IJ SOAJ injection, Inject 0.3 mg into the muscle once., Disp: , Rfl:  .  fexofenadine (ALLEGRA) 180 MG tablet, Take 180 mg by mouth daily., Disp: , Rfl:  .  fluconazole (DIFLUCAN) 150 MG tablet, Take 1 tablet (150 mg total) by mouth every 3 (three) days. For 2 doses, Disp: 2 tablet, Rfl: 0 .  lamoTRIgine (LAMICTAL) 25 MG tablet, Take 50 mg by mouth 2 (two) times daily. , Disp: , Rfl:  .  linagliptin (TRADJENTA) 5 MG TABS tablet, Take 5 mg by mouth daily., Disp: , Rfl:  .  loratadine (CLARITIN) 10 MG tablet, Take  10 mg by mouth daily., Disp: , Rfl:  .  Melatonin 5 MG TABS, Take 5 mg by mouth at bedtime., Disp: , Rfl:  .  Menthol, Topical Analgesic, (BIOFREEZE ROLL-ON COLORLESS) 4 % GEL, Apply 1 application topically daily as needed (FOR PAIN). , Disp: , Rfl:  .  metFORMIN (GLUCOPHAGE) 1000 MG tablet, Take 1,000 mg by mouth 2 (two) times daily with a meal. , Disp: , Rfl:  .  montelukast (SINGULAIR) 10 MG tablet, Take 1 tablet (10 mg total) by mouth at bedtime., Disp: 30 tablet, Rfl: 5 .  nitrofurantoin, macrocrystal-monohydrate, (MACROBID) 100 MG capsule, Take 1 capsule (100 mg total) by mouth 2 (two) times  daily. For uti, E Coli resistant to Cipro, Disp: 14 capsule, Rfl: 0 .  oxyCODONE (OXY IR/ROXICODONE) 5 MG immediate release tablet, Take 5 mg by mouth at bedtime., Disp: , Rfl:  .  pantoprazole (PROTONIX) 40 MG tablet, Take 40 mg by mouth every morning., Disp: , Rfl:  .  polyethylene glycol (MIRALAX / GLYCOLAX) packet, Take 17 g by mouth daily., Disp: , Rfl:  .  senna-docusate (SENOKOT-S) 8.6-50 MG tablet, Take 1 tablet by mouth at bedtime. , Disp: , Rfl:  .  simvastatin (ZOCOR) 10 MG tablet, Take 10 mg by mouth every evening. , Disp: , Rfl:  .  Sodium Fluoride (PREVIDENT 5000 BOOSTER) 1.1 % PSTE, Place 1 application onto teeth daily., Disp: , Rfl:  .  sulfamethoxazole-trimethoprim (BACTRIM DS) 800-160 MG tablet, Take 1 tablet by mouth 2 (two) times daily. 5 day course starting on 06/12/2019, Disp: , Rfl:  .  traMADol (ULTRAM) 50 MG tablet, Take by mouth every 6 (six) hours as needed., Disp: , Rfl:  .  triamcinolone (NASACORT ALLERGY 24HR) 55 MCG/ACT AERO nasal inhaler, Place 1 spray into the nose daily., Disp: , Rfl:  .  valACYclovir (VALTREX) 500 MG tablet, Take 500 mg by mouth daily. , Disp: , Rfl:   Current Facility-Administered Medications:  .  miconazole (MONISTAT 7) 2 % vaginal cream 1 Applicatorful, 1 Applicatorful, Vaginal, QHS, Tilda BurrowFerguson, Burlon Centrella V, MD  Review of Systems Constitutional: negative Gastrointestinal: negative Genitourinary: constant UTI  Objective:  BP (!) 186/86   Pulse 100   Wt 177 lb 9.6 oz (80.6 kg)   BMI 34.69 kg/m    BMI: Body mass index is 34.69 kg/m.  General Appearance: Alert, appropriate appearance for age. No acute distress HEENT: Grossly normal Neck / Thyroid:  Cardiovascular: RRR; normal S1, S2, no murmur Lungs: CTA bilaterally Back: No CVAT Breast Exam: Not examined Gastrointestinal: Soft, non-tender, no masses or organomegaly Pelvic Exam: VAGINA: small rectocele, CERVIX: surgically absent, UTERUS: absent.  Lymphatic Exam: Non-palpable nodes in  neck, clavicular, axillary, or inguinal regions  Skin: no rash or abnormalities Neurologic: Normal gait and speech, no tremor  Psychiatric: Alert and oriented, appropriate affect.  Urinalysis:Not done  By signing my name below, I, Arnette NorrisMari Johnson, attest that this documentation has been prepared under the direction and in the presence of Tilda BurrowFerguson, Alzada Brazee V, MD. Electronically Signed: Arnette NorrisMari Johnson Medical Scribe. 07/31/19. 10:25 AM.  I personally performed the services described in this documentation, which was SCRIBED in my presence. The recorded information has been reviewed and considered accurate. It has been edited as necessary during review. Tilda BurrowJohn V Banesa Tristan, MD

## 2019-07-31 NOTE — Addendum Note (Signed)
Addended by: Christiana Pellant A on: 07/31/2019 11:31 AM   Modules accepted: Orders

## 2019-08-09 ENCOUNTER — Other Ambulatory Visit: Payer: Medicare Other

## 2019-08-09 ENCOUNTER — Other Ambulatory Visit: Payer: Self-pay

## 2019-08-09 DIAGNOSIS — Z87440 Personal history of urinary (tract) infections: Secondary | ICD-10-CM

## 2019-08-09 DIAGNOSIS — R300 Dysuria: Secondary | ICD-10-CM

## 2019-08-11 LAB — URINE CULTURE

## 2019-08-22 ENCOUNTER — Telehealth: Payer: Self-pay | Admitting: *Deleted

## 2019-08-22 NOTE — Telephone Encounter (Signed)
Spoke with [facility] @ [facility]. Pt is complaining of hurting in vaginal area and "feels like something is going fall out". Advised she needs an appt. Front staff was at lunch so Erline Levine was advised to call back after 1:30 to schedule an appt with Dr. Glo Herring. [place]

## 2019-08-22 NOTE — Telephone Encounter (Signed)
[name] [name] with [facility] facility called and wants to speak with someone regarding this patient needing an appointment but did not leave details.

## 2019-08-26 ENCOUNTER — Telehealth: Payer: Self-pay | Admitting: Obstetrics and Gynecology

## 2019-08-26 NOTE — Telephone Encounter (Signed)
Called patient regarding appointment scheduled in our office encouraged to come alone to the visit if possible, however, a support person, over age 78, may accompany her  to appointment if assistance is needed for safety or care concerns. Otherwise, support persons should remain outside until the visit is complete.  ° °We ask if you have had any exposure to anyone suspected or confirmed of having COVID-19 or if you are experiencing any of the following, to call and reschedule your appointment: fever, cough, shortness of breath, muscle pain, diarrhea, rash, vomiting, abdominal pain, red eye, weakness, bruising, bleeding, joint pain, or a severe headache.  ° °Please know we will ask you these questions or similar questions when you arrive for your appointment and again it’s how we are keeping everyone safe.   ° °Also,to keep you safe, please use the provided hand sanitizer when you enter the office. We are asking everyone in the office to wear a mask to help prevent the spread of °germs. If you have a mask of your own, please wear it to your appointment, if not, we are happy to provide one for you. ° °Thank you for understanding and your cooperation.  ° ° °[facility] Staff ° ° ° °

## 2019-08-27 ENCOUNTER — Other Ambulatory Visit: Payer: Self-pay

## 2019-08-27 ENCOUNTER — Ambulatory Visit (INDEPENDENT_AMBULATORY_CARE_PROVIDER_SITE_OTHER): Payer: Medicare Other | Admitting: Obstetrics and Gynecology

## 2019-08-27 ENCOUNTER — Encounter: Payer: Self-pay | Admitting: Obstetrics and Gynecology

## 2019-08-27 VITALS — BP 171/78 | HR 75 | Ht 60.0 in | Wt 179.2 lb

## 2019-08-27 DIAGNOSIS — N816 Rectocele: Secondary | ICD-10-CM

## 2019-08-27 NOTE — Progress Notes (Addendum)
Patient ID: [name] CornVirginia S Dupuy, female   DOB: 03-Apr-1941, 78 y.o.   MRN: [id number]   [facility]  @DATE @            Patient name: [name] [name] MRN [id number]  Date of birth: 03-Apr-1941  CC & HPI:  [name] CornVirginia S Gail is a 78 y.o. female presenting today for complaints of bulging in vaginal area. and would like u/s. Feels as if " if pins is coming down". Has trouble passing BM, does not defecate as much. But uses miralax to help with BM 3-4 times a week which sometimes results in loose watery stool and perineal irritation..  ROS:  ROS +rectocele  Pertinent History Reviewed:   Reviewed: Significant for Medical         Past Medical History:  Diagnosis Date  . Anxiety   . Bowel obstruction (HCC)   . Burning with urination 01/04/2016  . Diabetes mellitus without complication (HCC)   . Genital herpes   . GERD (gastroesophageal reflux disease)   . Hematuria 01/04/2016  . Herpes 12/22/2015  . Hyperlipidemia   . Hypertension   . LLQ abdominal tenderness 01/04/2016  . Neuropathy   . Pelvic pressure in female 01/04/2016  . Recurrent UTI   . Sciatic nerve disease   . Urinary frequency 01/04/2016  . Urticaria                               Surgical Hx:    Past Surgical History:  Procedure Laterality Date  . ABDOMINAL HYSTERECTOMY    . APPLICATION OF INTRAOPERATIVE CT SCAN N/A 12/19/2018   Procedure: APPLICATION OF INTRAOPERATIVE CT SCAN;  Surgeon: Maeola HarmanStern, Joseph, MD;  [facility];  Service: Neurosurgery;  Laterality: N/A;  . bowel obstruction    . COLON SURGERY     blocked colon  . POSTERIOR CERVICAL FUSION/FORAMINOTOMY N/A 12/19/2018   Procedure: Cervical Seven to Thoracic One Posterior cervical fusion;  Surgeon: Maeola HarmanStern, Joseph, MD;  [facility];  Service: Neurosurgery;  Laterality: N/A;  Cervical Seven to Thoracic One Posterior cervical fusion   Medications: Reviewed & Updated - see associated section                       Current Outpatient Medications:  .   acetaminophen (TYLENOL) 325 MG tablet, Take 2 tablets (650 mg total) by mouth every 6 (six) hours as needed for mild pain, moderate pain, fever or headache. (Patient taking differently: Take 650 mg by mouth every 8 (eight) hours as needed for mild pain, moderate pain, fever or headache. ), Disp: , Rfl:  .  aspirin EC 81 MG tablet, Take 81 mg by mouth daily., Disp: , Rfl:  .  DHA-Vitamin C-Lutein (EYE HEALTH FORMULA PO), Take 1 tablet by mouth daily. , Disp: , Rfl:  .  DULoxetine (CYMBALTA) 60 MG capsule, Take 60 mg by mouth daily. , Disp: , Rfl:  .  Emollient (EUCERIN) lotion, Apply topically 4 (four) times daily as needed for dry skin., Disp: , Rfl:  .  EPINEPHrine (EPIPEN 2-PAK) 0.3 mg/0.3 mL IJ SOAJ injection, Inject 0.3 mg into the muscle once., Disp: , Rfl:  .  fexofenadine (ALLEGRA) 180 MG tablet, Take 180 mg by mouth daily., Disp: , Rfl:  .  fluconazole (DIFLUCAN) 150 MG tablet, Take 1 tablet (150 mg total) by mouth every 3 (three) days. For 2 doses, Disp: 2 tablet, Rfl:  0 .  lamoTRIgine (LAMICTAL) 25 MG tablet, Take 50 mg by mouth 2 (two) times daily. , Disp: , Rfl:  .  linagliptin (TRADJENTA) 5 MG TABS tablet, Take 5 mg by mouth daily., Disp: , Rfl:  .  loratadine (CLARITIN) 10 MG tablet, Take 10 mg by mouth daily., Disp: , Rfl:  .  Melatonin 5 MG TABS, Take 5 mg by mouth at bedtime., Disp: , Rfl:  .  Menthol, Topical Analgesic, (BIOFREEZE ROLL-ON COLORLESS) 4 % GEL, Apply 1 application topically daily as needed (FOR PAIN). , Disp: , Rfl:  .  metFORMIN (GLUCOPHAGE) 1000 MG tablet, Take 1,000 mg by mouth 2 (two) times daily with a meal. , Disp: , Rfl:  .  montelukast (SINGULAIR) 10 MG tablet, Take 1 tablet (10 mg total) by mouth at bedtime., Disp: 30 tablet, Rfl: 5 .  nitrofurantoin, macrocrystal-monohydrate, (MACROBID) 100 MG capsule, Take 1 capsule (100 mg total) by mouth 2 (two) times daily. For uti, E Coli resistant to Cipro, Disp: 14 capsule, Rfl: 0 .  oxyCODONE (OXY IR/ROXICODONE) 5 MG  immediate release tablet, Take 5 mg by mouth at bedtime., Disp: , Rfl:  .  pantoprazole (PROTONIX) 40 MG tablet, Take 40 mg by mouth every morning., Disp: , Rfl:  .  polyethylene glycol (MIRALAX / GLYCOLAX) packet, Take 17 g by mouth daily., Disp: , Rfl:  .  senna-docusate (SENOKOT-S) 8.6-50 MG tablet, Take 1 tablet by mouth at bedtime. , Disp: , Rfl:  .  simvastatin (ZOCOR) 10 MG tablet, Take 10 mg by mouth every evening. , Disp: , Rfl:  .  Sodium Fluoride (PREVIDENT 5000 BOOSTER) 1.1 % PSTE, Place 1 application onto teeth daily., Disp: , Rfl:  .  sulfamethoxazole-trimethoprim (BACTRIM DS) 800-160 MG tablet, Take 1 tablet by mouth 2 (two) times daily. 5 day course starting on 06/12/2019, Disp: , Rfl:  .  traMADol (ULTRAM) 50 MG tablet, Take by mouth every 6 (six) hours as needed., Disp: , Rfl:  .  triamcinolone (NASACORT ALLERGY 24HR) 55 MCG/ACT AERO nasal inhaler, Place 1 spray into the nose daily., Disp: , Rfl:  .  valACYclovir (VALTREX) 500 MG tablet, Take 500 mg by mouth daily. , Disp: , Rfl:   Current Facility-Administered Medications:  .  miconazole (MONISTAT 7) 2 % vaginal cream 1 Applicatorful, 1 Applicatorful, Vaginal, QHS, Jonnie Kind, MD   Social History: Reviewed -  reports that she has never smoked. Her smokeless tobacco use includes snuff.  Objective Findings:  Vitals: Blood pressure (!) 171/78, pulse 75, height 5' (1.524 m), weight 179 lb 3.2 oz (81.3 kg).  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  Rectocele, stable  P:   1. PRN 2. Continue Miralax use at a reduced rate    By signing my name below, I, Samul Dada, attest that this documentation has been prepared under the direction and in the presence of Jonnie Kind, MD. Electronically Signed: [place]. 08/27/19. 11:48 AM.  I personally performed the services described in this documentation, which was SCRIBED in my presence. The recorded information has been  reviewed and considered accurate. It has been edited as necessary during review. Jonnie Kind, MD

## 2019-09-10 ENCOUNTER — Other Ambulatory Visit: Payer: Self-pay

## 2019-09-10 ENCOUNTER — Ambulatory Visit
Admission: RE | Admit: 2019-09-10 | Discharge: 2019-09-10 | Disposition: A | Discharge status: Home / Self Care | Payer: Medicare Other | Source: Ambulatory Visit | Attending: Internal Medicine | Admitting: Internal Medicine

## 2019-09-10 DIAGNOSIS — Z1231 Encounter for screening mammogram for malignant neoplasm of breast: Secondary | ICD-10-CM

## 2019-10-03 ENCOUNTER — Ambulatory Visit (INDEPENDENT_AMBULATORY_CARE_PROVIDER_SITE_OTHER): Payer: Medicare Other | Admitting: Otolaryngology

## 2019-10-03 DIAGNOSIS — H6121 Impacted cerumen, right ear: Secondary | ICD-10-CM

## 2019-10-03 DIAGNOSIS — H903 Sensorineural hearing loss, bilateral: Secondary | ICD-10-CM

## 2019-12-16 ENCOUNTER — Emergency Department (HOSPITAL_COMMUNITY)
Admission: EM | Admit: 2019-12-16 | Discharge: 2019-12-16 | Disposition: A | Discharge status: Home / Self Care | Payer: Medicare Other | Attending: Emergency Medicine | Admitting: Emergency Medicine

## 2019-12-16 ENCOUNTER — Encounter (HOSPITAL_COMMUNITY): Payer: Self-pay | Admitting: *Deleted

## 2019-12-16 ENCOUNTER — Other Ambulatory Visit: Payer: Self-pay

## 2019-12-16 ENCOUNTER — Emergency Department (HOSPITAL_COMMUNITY): Payer: Medicare Other

## 2019-12-16 DIAGNOSIS — Z7982 Long term (current) use of aspirin: Secondary | ICD-10-CM | POA: Insufficient documentation

## 2019-12-16 DIAGNOSIS — I10 Essential (primary) hypertension: Secondary | ICD-10-CM | POA: Insufficient documentation

## 2019-12-16 DIAGNOSIS — Z79899 Other long term (current) drug therapy: Secondary | ICD-10-CM | POA: Diagnosis not present

## 2019-12-16 DIAGNOSIS — N3000 Acute cystitis without hematuria: Secondary | ICD-10-CM | POA: Insufficient documentation

## 2019-12-16 DIAGNOSIS — Z7984 Long term (current) use of oral hypoglycemic drugs: Secondary | ICD-10-CM | POA: Diagnosis not present

## 2019-12-16 DIAGNOSIS — E119 Type 2 diabetes mellitus without complications: Secondary | ICD-10-CM | POA: Insufficient documentation

## 2019-12-16 LAB — BASIC METABOLIC PANEL
Anion gap: 16 — ABNORMAL HIGH (ref 5–15)
BUN: 10 mg/dL (ref 8–23)
CO2: 25 mmol/L (ref 22–32)
Calcium: 9.7 mg/dL (ref 8.9–10.3)
Chloride: 94 mmol/L — ABNORMAL LOW (ref 98–111)
Creatinine, Ser: 0.75 mg/dL (ref 0.44–1.00)
GFR calc Af Amer: >60 mL/min (ref >60)
GFR calc non Af Amer: >60 mL/min (ref >60)
Glucose, Bld: 127 mg/dL — ABNORMAL HIGH (ref 70–99)
Potassium: 3.5 mmol/L (ref 3.5–5.1)
Sodium: 135 mmol/L (ref 135–145)

## 2019-12-16 LAB — URINALYSIS, ROUTINE W REFLEX MICROSCOPIC
Bilirubin Urine: NEGATIVE
Glucose, UA: NEGATIVE mg/dL
Hgb urine dipstick: NEGATIVE
Ketones, ur: NEGATIVE mg/dL
Nitrite: POSITIVE — AB
Protein, ur: NEGATIVE mg/dL
Specific Gravity, Urine: 1.011 (ref 1.005–1.030)
WBC, UA: >50 WBC/hpf — ABNORMAL HIGH (ref 0–5)
pH: 7 (ref 5.0–8.0)

## 2019-12-16 LAB — CBC WITH DIFFERENTIAL/PLATELET
Abs Immature Granulocytes: 0.04 10*3/uL (ref 0.00–0.07)
Basophils Absolute: 0.1 10*3/uL (ref 0.0–0.1)
Basophils Relative: 1 %
Eosinophils Absolute: 0.1 10*3/uL (ref 0.0–0.5)
Eosinophils Relative: 1 %
HCT: 46.2 % — ABNORMAL HIGH (ref 36.0–46.0)
Hemoglobin: 14.4 g/dL (ref 12.0–15.0)
Immature Granulocytes: 0 %
Lymphocytes Relative: 19 %
Lymphs Abs: 2.5 10*3/uL (ref 0.7–4.0)
MCH: 27.4 pg (ref 26.0–34.0)
MCHC: 31.2 g/dL (ref 30.0–36.0)
MCV: 88 fL (ref 80.0–100.0)
Monocytes Absolute: 1 10*3/uL (ref 0.1–1.0)
Monocytes Relative: 8 %
Neutro Abs: 9.4 10*3/uL — ABNORMAL HIGH (ref 1.7–7.7)
Neutrophils Relative %: 71 %
Platelets: 309 10*3/uL (ref 150–400)
RBC: 5.25 MIL/uL — ABNORMAL HIGH (ref 3.87–5.11)
RDW: 15.1 % (ref 11.5–15.5)
WBC: 13.2 10*3/uL — ABNORMAL HIGH (ref 4.0–10.5)
nRBC: 0 % (ref 0.0–0.2)

## 2019-12-16 MED ORDER — AMLODIPINE BESYLATE 10 MG PO TABS: 10.0000 mg | ORAL_TABLET | Freq: Every day | ORAL | 0 refills | Status: AC

## 2019-12-16 MED ORDER — CEPHALEXIN 500 MG PO CAPS
500.0000 mg | ORAL_CAPSULE | Freq: Once | ORAL | Status: AC
  Administered 2019-12-16: 500 mg via ORAL
  Filled 2019-12-16: qty 1

## 2019-12-16 MED ORDER — LABETALOL HCL 5 MG/ML IV SOLN
10.0000 mg | Freq: Once | INTRAVENOUS | Status: AC
  Administered 2019-12-16: 10 mg via INTRAVENOUS
  Filled 2019-12-16: qty 4

## 2019-12-16 MED ORDER — AMLODIPINE BESYLATE 5 MG PO TABS
10.0000 mg | ORAL_TABLET | Freq: Once | ORAL | Status: AC
  Administered 2019-12-16: 10 mg via ORAL
  Filled 2019-12-16: qty 2

## 2019-12-16 MED ORDER — ACETAMINOPHEN 325 MG PO TABS
650.0000 mg | ORAL_TABLET | Freq: Once | ORAL | Status: AC
  Administered 2019-12-16: 650 mg via ORAL
  Filled 2019-12-16: qty 2

## 2019-12-16 MED ORDER — CEPHALEXIN 500 MG PO CAPS: 500.0000 mg | ORAL_CAPSULE | Freq: Two times a day (BID) | ORAL | 0 refills | Status: DC

## 2019-12-16 NOTE — ED Notes (Signed)
Patient granddaughter on way to pick up patient to take to [facility]

## 2019-12-16 NOTE — ED Provider Notes (Signed)
[facility] EMERGENCY DEPARTMENT Provider Note   CSN: [id number] Arrival date & time: 12/16/19  1245     History Chief Complaint  Patient presents with  . Hypertension    [name] [name] is a 79 y.o. female with history of HTN who presents with high blood pressure. She is a resident at [facility] assisted living and was diagnosed with COVID-19 one week ago. She states that today she was working with PT today and her BP was noted to be >200 systolic and >100 diastolic. On review of EMR BP typically ranges from 150-180 systolic over the past year. She does not think she takes any BP meds. EMS was called and she was transported to the ED. She denies any symptoms. Last night she was having body aches and her sciatica in there L leg was bothering her. Today this is better. She denies fever, chills, headache, chest pain, SOB, abdominal pain. She reports a "little" cough. Staff states that they called EMS bc the patient was complaining of a headache at their facility. They confirm she is not on BP meds.  HPI   Past Medical History:  Diagnosis Date  . Anxiety   . Bowel obstruction (HCC)   . Burning with urination 01/04/2016  . Diabetes mellitus without complication (HCC)   . Genital herpes   . GERD (gastroesophageal reflux disease)   . Hematuria 01/04/2016  . Herpes 12/22/2015  . Hyperlipidemia   . Hypertension   . LLQ abdominal tenderness 01/04/2016  . Neuropathy   . Pelvic pressure in female 01/04/2016  . Recurrent UTI   . Sciatic nerve disease   . Urinary frequency 01/04/2016  . Urticaria     Patient Active Problem List   Diagnosis Date Noted  . Cord compression (HCC) 12/15/2018  . Small bowel obstruction (HCC) 04/27/2016  . Non-insulin dependent type 2 diabetes mellitus (HCC) 04/27/2016  . Depression 04/27/2016  . Anxiety state 04/27/2016  . GERD (gastroesophageal reflux disease) 04/27/2016  . SBO (small bowel obstruction) (HCC) 04/27/2016  . Generalized weakness 02/03/2016  .  Essential hypertension 02/03/2016  . Urinary frequency 01/04/2016  . Pelvic pressure in female 01/04/2016  . Hematuria 01/04/2016  . Herpes 12/22/2015    Past Surgical History:  Procedure Laterality Date  . ABDOMINAL HYSTERECTOMY    . APPLICATION OF INTRAOPERATIVE CT SCAN N/A 12/19/2018   Procedure: APPLICATION OF INTRAOPERATIVE CT SCAN;  Surgeon: Maeola Harman, MD;  Location: [facility] OR;  Service: Neurosurgery;  Laterality: N/A;  . bowel obstruction    . COLON SURGERY     blocked colon  . POSTERIOR CERVICAL FUSION/FORAMINOTOMY N/A 12/19/2018   Procedure: Cervical Seven to Thoracic One Posterior cervical fusion;  Surgeon: Maeola Harman, MD;  Location: [facility] OR;  Service: Neurosurgery;  Laterality: N/A;  Cervical Seven to Thoracic One Posterior cervical fusion     OB History    Gravida  3   Para  3   Term  3   Preterm      AB      Living  3     SAB      TAB      Ectopic      Multiple      Live Births              Family History  Problem Relation Age of Onset  . Diabetes Mother   . Heart disease Brother   . Hypertension Brother   . Diabetes Brother   . Hypertension Daughter   .  Diabetes Brother   . Diabetes Brother   . Alzheimer's disease Brother   . Anxiety disorder Daughter     Social History   Tobacco Use  . Smoking status: Never Smoker  . Smokeless tobacco: Current User    Types: Snuff  Substance Use Topics  . Alcohol use: No  . Drug use: No    Home Medications Prior to Admission medications   Medication Sig Start Date End Date Taking? Authorizing Provider  acetaminophen (TYLENOL) 325 MG tablet Take 2 tablets (650 mg total) by mouth every 6 (six) hours as needed for mild pain, moderate pain, fever or headache. Patient taking differently: Take 650 mg by mouth every 8 (eight) hours as needed for mild pain, moderate pain, fever or headache.  12/22/18   Synetta Shadow, MD  aspirin EC 81 MG tablet Take 81 mg by mouth daily.    [provider]    DHA-Vitamin C-Lutein (EYE HEALTH FORMULA PO) Take 1 tablet by mouth daily.     [provider]  DULoxetine (CYMBALTA) 60 MG capsule Take 60 mg by mouth daily.     [provider]  Emollient (EUCERIN) lotion Apply topically 4 (four) times daily as needed for dry skin.    [provider]  EPINEPHrine (EPIPEN 2-PAK) 0.3 mg/0.3 mL IJ SOAJ injection Inject 0.3 mg into the muscle once.    [provider]  fexofenadine (ALLEGRA) 180 MG tablet Take 180 mg by mouth daily.    [provider]  fluconazole (DIFLUCAN) 150 MG tablet Take 1 tablet (150 mg total) by mouth every 3 (three) days. For 2 doses 07/27/19   Tilda Burrow, MD  lamoTRIgine (LAMICTAL) 25 MG tablet Take 50 mg by mouth 2 (two) times daily.     [provider]  linagliptin (TRADJENTA) 5 MG TABS tablet Take 5 mg by mouth daily.    [provider]  loratadine (CLARITIN) 10 MG tablet Take 10 mg by mouth daily.    [provider]  Melatonin 5 MG TABS Take 5 mg by mouth at bedtime.    [provider]  Menthol, Topical Analgesic, (BIOFREEZE ROLL-ON COLORLESS) 4 % GEL Apply 1 application topically daily as needed (FOR PAIN).     [provider]  metFORMIN (GLUCOPHAGE) 1000 MG tablet Take 1,000 mg by mouth 2 (two) times daily with a meal.     [provider]  montelukast (SINGULAIR) 10 MG tablet Take 1 tablet (10 mg total) by mouth at bedtime. 08/22/18   Kozlow, Alvira Philips, MD  nitrofurantoin, macrocrystal-monohydrate, (MACROBID) 100 MG capsule Take 1 capsule (100 mg total) by mouth 2 (two) times daily. For uti, E Coli resistant to Cipro 07/26/19   Tilda Burrow, MD  oxyCODONE (OXY IR/ROXICODONE) 5 MG immediate release tablet Take 5 mg by mouth at bedtime.    [provider]  pantoprazole (PROTONIX) 40 MG tablet Take 40 mg by mouth every morning.    [provider]  polyethylene glycol (MIRALAX / GLYCOLAX) packet Take 17 g by mouth daily.     [provider]  senna-docusate (SENOKOT-S) 8.6-50 MG tablet Take 1 tablet by mouth at bedtime.     [provider]  simvastatin (ZOCOR) 10 MG tablet Take 10 mg by mouth every evening.     [provider]  Sodium Fluoride (PREVIDENT 5000 BOOSTER) 1.1 % PSTE Place 1 application onto teeth daily.    [provider]  sulfamethoxazole-trimethoprim (BACTRIM DS) 800-160 MG tablet  Take 1 tablet by mouth 2 (two) times daily. 5 day course starting on 06/12/2019    [provider]  traMADol (ULTRAM) 50 MG tablet Take by mouth every 6 (six) hours as needed.    [provider]  triamcinolone (NASACORT ALLERGY 24HR) 55 MCG/ACT AERO nasal inhaler Place 1 spray into the nose daily.    [provider]  valACYclovir (VALTREX) 500 MG tablet Take 500 mg by mouth daily.  05/05/16   [provider]  gabapentin (NEURONTIN) 300 MG capsule Take 300 mg by mouth 2 (two) times daily. Reported on 02/03/2016  04/18/16  [provider]    Allergies    Lisinopril, Penicillins, and Sulfa antibiotics  Review of Systems   Review of Systems  Constitutional: Negative for chills and fever.  Respiratory: Negative for shortness of breath.   Cardiovascular: Negative for chest pain.  Gastrointestinal: Negative for abdominal pain.  Musculoskeletal: Positive for arthralgias, back pain and myalgias.  Neurological: Positive for headaches.  All other systems reviewed and are negative.   Physical Exam Updated Vital Signs BP (!) 218/113 (BP Location: Right Arm)   Pulse 98   Temp 97.9 F (36.6 C) (Oral)   Resp 15   Ht 5\' 1"  (1.549 m)   Wt 84.8 kg   SpO2 100%   BMI 35.33 kg/m   Physical Exam Vitals and nursing note reviewed.  Constitutional:      General: She is not in acute distress.    Appearance: Normal appearance. She is well-developed. She is not ill-appearing.  HENT:     Head: Normocephalic and atraumatic.  Eyes:     General: No scleral  icterus.       Right eye: No discharge.        Left eye: No discharge.     Conjunctiva/sclera: Conjunctivae normal.     Pupils: Pupils are equal, round, and reactive to light.  Cardiovascular:     Rate and Rhythm: Normal rate and regular rhythm.  Pulmonary:     Effort: Pulmonary effort is normal. No respiratory distress.     Breath sounds: Normal breath sounds.  Abdominal:     General: There is no distension.     Palpations: Abdomen is soft.     Tenderness: There is no abdominal tenderness.  Musculoskeletal:     Cervical back: Normal range of motion.  Skin:    General: Skin is warm and dry.  Neurological:     Mental Status: She is alert and oriented to person, place, and time.     Comments: Lying on stretcher in NAD. GCS 15. Speaks in a clear voice. Cranial nerves II through XII grossly intact. 5/5 strength in all extremities. Sensation fully intact.  Bilateral finger-to-nose intact.  Psychiatric:        Behavior: Behavior normal.     ED Results / Procedures / Treatments   Labs (all labs ordered are listed, but only abnormal results are displayed) Labs Reviewed  BASIC METABOLIC PANEL - Abnormal; Notable for the following components:      Result Value   Chloride 94 (*)    Glucose, Bld 127 (*)    Anion gap 16 (*)    All other components within normal limits  CBC WITH DIFFERENTIAL/PLATELET - Abnormal; Notable for the following components:   WBC 13.2 (*)    RBC 5.25 (*)    HCT 46.2 (*)    Neutro Abs 9.4 (*)    All other components within normal limits  URINALYSIS,  ROUTINE W REFLEX MICROSCOPIC - Abnormal; Notable for the following components:   APPearance HAZY (*)    Nitrite POSITIVE (*)    Leukocytes,Ua LARGE (*)    WBC, UA >50 (*)    Bacteria, UA FEW (*)    All other components within normal limits  URINE CULTURE    EKG EKG Interpretation  Date/Time:  Monday December 16 2019 13:31:29 EST Ventricular Rate:  101 PR Interval:    QRS Duration: 86 QT  Interval:  376 QTC Calculation: 488 R Axis:   -25 Text Interpretation: Sinus tachycardia Borderline left axis deviation Anterior infarct, age indeterminate Confirmed by Virgel Manifold [phone]) on 12/16/2019 2:14:54 PM   Radiology No results found.  Procedures Procedures (including critical care time)  Medications Ordered in ED Medications  cephALEXin (KEFLEX) capsule 500 mg (has no administration in time range)  labetalol (NORMODYNE) injection 10 mg (10 mg Intravenous Given 12/16/19 1337)  amLODipine (NORVASC) tablet 10 mg (10 mg Oral Given 12/16/19 1458)  acetaminophen (TYLENOL) tablet 650 mg (650 mg Oral Given 12/16/19 1458)    ED Course  I have reviewed the triage vital signs and the nursing notes.  Pertinent labs & imaging results that were available during my care of the patient were reviewed by me and considered in my medical decision making (see chart for details).  79 year old female with Covid presents with elevated blood pressure.  Patient initially has no complaints although staff says that she is complaining of a headache at their facility.  Neuro exam is unremarkable.  She does have significantly elevated blood pressure here.  She is not on any meds at her facility.  We will give her a dose of labetalol obtain labs and EKG.  Discussed with Dr. Wilson Singer.  Will forego head imaging at this time.  CBC is remarkable for mild leukocytosis.  BMP is remarkable for anion gap of 16.  Her blood sugar is 127.  She now mentions to me that she is having burning with urination.  Urinalysis is consistent with UTI with large leukocytes, positive nitrites, few bacteria and over 50 white blood cells.  We will send off a urine culture.  She was given amlodipine and Keflex here.  Blood pressure is improved after medications.  We will send her back to her facility with rx for Amlodipine.  MDM Rules/Calculators/A&P                       Final Clinical Impression(s) / ED Diagnoses Final diagnoses:  None     Rx / DC Orders ED Discharge Orders    None       Recardo Evangelist, PA-C 12/16/19 1807    Virgel Manifold, MD 12/17/19 [phone]

## 2019-12-16 NOTE — ED Triage Notes (Signed)
[facility] called EMS due to patient having hypertention.  Patient has been diagnosed with covid for one week.  EMS reports 213/104.

## 2019-12-16 NOTE — Discharge Instructions (Signed)
Start Amlodipine once daily for high blood pressure Start Keflex twice daily for 5 days for UTI symptoms

## 2019-12-18 ENCOUNTER — Emergency Department (HOSPITAL_COMMUNITY): Payer: Medicare Other

## 2019-12-18 ENCOUNTER — Emergency Department (HOSPITAL_COMMUNITY)
Admission: EM | Admit: 2019-12-18 | Discharge: 2019-12-23 | Disposition: A | Discharge status: Home / Self Care | Payer: Medicare Other | Attending: Emergency Medicine | Admitting: Emergency Medicine

## 2019-12-18 ENCOUNTER — Other Ambulatory Visit: Payer: Self-pay

## 2019-12-18 ENCOUNTER — Encounter (HOSPITAL_COMMUNITY): Payer: Self-pay

## 2019-12-18 DIAGNOSIS — Z7984 Long term (current) use of oral hypoglycemic drugs: Secondary | ICD-10-CM | POA: Insufficient documentation

## 2019-12-18 DIAGNOSIS — I10 Essential (primary) hypertension: Secondary | ICD-10-CM | POA: Diagnosis not present

## 2019-12-18 DIAGNOSIS — F17228 Nicotine dependence, chewing tobacco, with other nicotine-induced disorders: Secondary | ICD-10-CM | POA: Diagnosis not present

## 2019-12-18 DIAGNOSIS — Z79899 Other long term (current) drug therapy: Secondary | ICD-10-CM | POA: Diagnosis not present

## 2019-12-18 DIAGNOSIS — R112 Nausea with vomiting, unspecified: Secondary | ICD-10-CM

## 2019-12-18 DIAGNOSIS — R197 Diarrhea, unspecified: Secondary | ICD-10-CM | POA: Insufficient documentation

## 2019-12-18 DIAGNOSIS — U071 COVID-19: Secondary | ICD-10-CM | POA: Insufficient documentation

## 2019-12-18 DIAGNOSIS — E1140 Type 2 diabetes mellitus with diabetic neuropathy, unspecified: Secondary | ICD-10-CM | POA: Insufficient documentation

## 2019-12-18 DIAGNOSIS — Z7982 Long term (current) use of aspirin: Secondary | ICD-10-CM | POA: Diagnosis not present

## 2019-12-18 LAB — CBC WITH DIFFERENTIAL/PLATELET
Abs Immature Granulocytes: 0.06 10*3/uL (ref 0.00–0.07)
Basophils Absolute: 0.2 10*3/uL — ABNORMAL HIGH (ref 0.0–0.1)
Basophils Relative: 1 %
Eosinophils Absolute: 0.1 10*3/uL (ref 0.0–0.5)
Eosinophils Relative: 1 %
HCT: 45.8 % (ref 36.0–46.0)
Hemoglobin: 14.7 g/dL (ref 12.0–15.0)
Immature Granulocytes: 0 %
Lymphocytes Relative: 12 %
Lymphs Abs: 2.1 10*3/uL (ref 0.7–4.0)
MCH: 27.9 pg (ref 26.0–34.0)
MCHC: 32.1 g/dL (ref 30.0–36.0)
MCV: 87.1 fL (ref 80.0–100.0)
Monocytes Absolute: 1.4 10*3/uL — ABNORMAL HIGH (ref 0.1–1.0)
Monocytes Relative: 8 %
Neutro Abs: 12.8 10*3/uL — ABNORMAL HIGH (ref 1.7–7.7)
Neutrophils Relative %: 78 %
Platelets: 330 10*3/uL (ref 150–400)
RBC: 5.26 MIL/uL — ABNORMAL HIGH (ref 3.87–5.11)
RDW: 15.3 % (ref 11.5–15.5)
WBC: 16.6 10*3/uL — ABNORMAL HIGH (ref 4.0–10.5)
nRBC: 0 % (ref 0.0–0.2)

## 2019-12-18 LAB — URINALYSIS, ROUTINE W REFLEX MICROSCOPIC
Bacteria, UA: NONE SEEN
Bilirubin Urine: NEGATIVE
Glucose, UA: NEGATIVE mg/dL
Hgb urine dipstick: NEGATIVE
Ketones, ur: 5 mg/dL — AB
Leukocytes,Ua: NEGATIVE
Nitrite: NEGATIVE
Protein, ur: 100 mg/dL — AB
Specific Gravity, Urine: 1.025 (ref 1.005–1.030)
pH: 5 (ref 5.0–8.0)

## 2019-12-18 LAB — COMPREHENSIVE METABOLIC PANEL
ALT: 18 U/L (ref 0–44)
AST: 19 U/L (ref 15–41)
Albumin: 4.4 g/dL (ref 3.5–5.0)
Alkaline Phosphatase: 90 U/L (ref 38–126)
Anion gap: 17 — ABNORMAL HIGH (ref 5–15)
BUN: 23 mg/dL (ref 8–23)
CO2: 24 mmol/L (ref 22–32)
Calcium: 9.7 mg/dL (ref 8.9–10.3)
Chloride: 95 mmol/L — ABNORMAL LOW (ref 98–111)
Creatinine, Ser: 0.85 mg/dL (ref 0.44–1.00)
GFR calc Af Amer: >60 mL/min (ref >60)
GFR calc non Af Amer: >60 mL/min (ref >60)
Glucose, Bld: 168 mg/dL — ABNORMAL HIGH (ref 70–99)
Potassium: 3.7 mmol/L (ref 3.5–5.1)
Sodium: 136 mmol/L (ref 135–145)
Total Bilirubin: 0.8 mg/dL (ref 0.3–1.2)
Total Protein: 7.9 g/dL (ref 6.5–8.1)

## 2019-12-18 LAB — POC SARS CORONAVIRUS 2 AG -  ED: SARS Coronavirus 2 Ag: NEGATIVE

## 2019-12-18 LAB — URINE CULTURE: Culture: >=100000 — AB

## 2019-12-18 LAB — TROPONIN I (HIGH SENSITIVITY)
Troponin I (High Sensitivity): 6 ng/L (ref <18)
Troponin I (High Sensitivity): 6 ng/L (ref <18)

## 2019-12-18 LAB — LIPASE, BLOOD: Lipase: 29 U/L (ref 11–51)

## 2019-12-18 MED ORDER — IOHEXOL 300 MG/ML  SOLN
100.0000 mL | Freq: Once | INTRAMUSCULAR | Status: AC | PRN
  Administered 2019-12-18: 100 mL via INTRAVENOUS

## 2019-12-18 MED ORDER — ACETAMINOPHEN 325 MG PO TABS
650.0000 mg | ORAL_TABLET | ORAL | Status: DC | PRN
  Administered 2019-12-18 – 2019-12-23 (×4): 650 mg via ORAL
  Filled 2019-12-18 (×5): qty 2

## 2019-12-18 MED ORDER — SODIUM CHLORIDE 0.9 % IV BOLUS
500.0000 mL | Freq: Once | INTRAVENOUS | Status: AC
  Administered 2019-12-18: 500 mL via INTRAVENOUS

## 2019-12-18 NOTE — ED Provider Notes (Signed)
[facility] EMERGENCY DEPARTMENT Provider Note   CSN: [id number] Arrival date & time: 12/18/19  1035     History Chief Complaint  Patient presents with  . Hypertension  . Diarrhea    [name] [name] is a 79 y.o. female.  HPI     This is a 79 year old female with a history of bowel obstruction, diabetes, hypertension, hyperlipidemia and recent diagnosis of COVID-19 several weeks ago who presents from [facility] with concerns for nausea, vomiting, and diarrhea.  Also reported elevated BP.  She was seen and evaluated 2 days ago for the same and had a reassuring work-up.  She is oriented to herself and place but not time.  She is difficult to obtain a history from in general.  Daughter reportedly called the nurse and stated that she had not been eating or drinking much over the last several days.  Patient denies any pain at this time.  She mostly reports nausea and vomiting.  Denies any bloody stools.  Denies chest pain or shortness of breath.  No reported fevers.  Patient was seen 2 days ago with concerns for high blood pressure.  Was noted to have a UTI.  Was discharged with antibiotics.  Past Medical History:  Diagnosis Date  . Anxiety   . Bowel obstruction (HCC)   . Burning with urination 01/04/2016  . Diabetes mellitus without complication (HCC)   . Genital herpes   . GERD (gastroesophageal reflux disease)   . Hematuria 01/04/2016  . Herpes 12/22/2015  . Hyperlipidemia   . Hypertension   . LLQ abdominal tenderness 01/04/2016  . Neuropathy   . Pelvic pressure in female 01/04/2016  . Recurrent UTI   . Sciatic nerve disease   . Urinary frequency 01/04/2016  . Urticaria     Patient Active Problem List   Diagnosis Date Noted  . Cord compression (HCC) 12/15/2018  . Small bowel obstruction (HCC) 04/27/2016  . Non-insulin dependent type 2 diabetes mellitus (HCC) 04/27/2016  . Depression 04/27/2016  . Anxiety state 04/27/2016  . GERD (gastroesophageal reflux disease) 04/27/2016    . SBO (small bowel obstruction) (HCC) 04/27/2016  . Generalized weakness 02/03/2016  . Essential hypertension 02/03/2016  . Urinary frequency 01/04/2016  . Pelvic pressure in female 01/04/2016  . Hematuria 01/04/2016  . Herpes 12/22/2015    Past Surgical History:  Procedure Laterality Date  . ABDOMINAL HYSTERECTOMY    . APPLICATION OF INTRAOPERATIVE CT SCAN N/A 12/19/2018   Procedure: APPLICATION OF INTRAOPERATIVE CT SCAN;  Surgeon: Maeola HarmanStern, Joseph, MD;  [facility];  Service: Neurosurgery;  Laterality: N/A;  . bowel obstruction    . COLON SURGERY     blocked colon  . POSTERIOR CERVICAL FUSION/FORAMINOTOMY N/A 12/19/2018   Procedure: Cervical Seven to Thoracic One Posterior cervical fusion;  Surgeon: Maeola HarmanStern, Joseph, MD;  [facility];  Service: Neurosurgery;  Laterality: N/A;  Cervical Seven to Thoracic One Posterior cervical fusion     OB History    Gravida  3   Para  3   Term  3   Preterm      AB      Living  3     SAB      TAB      Ectopic      Multiple      Live Births              Family History  Problem Relation Age of Onset  . Diabetes Mother   . Heart disease  Brother   . Hypertension Brother   . Diabetes Brother   . Hypertension Daughter   . Diabetes Brother   . Diabetes Brother   . Alzheimer's disease Brother   . Anxiety disorder Daughter     Social History   Tobacco Use  . Smoking status: Never Smoker  . Smokeless tobacco: Current User    Types: Snuff  Substance Use Topics  . Alcohol use: No  . Drug use: No    Home Medications Prior to Admission medications   Medication Sig Start Date End Date Taking? Authorizing Provider  amLODipine (NORVASC) 10 MG tablet Take 1 tablet (10 mg total) by mouth daily. 12/16/19   Recardo Evangelist, PA-C  aspirin EC 81 MG tablet Take 81 mg by mouth daily.    [provider]  cephALEXin (KEFLEX) 500 MG capsule Take 1 capsule (500 mg total) by mouth 2 (two) times daily. 12/16/19   Recardo Evangelist, PA-C  DHA-Vitamin C-Lutein (EYE HEALTH FORMULA PO) Take 1 tablet by mouth daily.     [provider]  DULoxetine (CYMBALTA) 60 MG capsule Take 60 mg by mouth daily.     [provider]  Emollient (EUCERIN) lotion Apply topically 4 (four) times daily as needed for dry skin.    [provider]  EPINEPHrine (EPIPEN 2-PAK) 0.3 mg/0.3 mL IJ SOAJ injection Inject 0.3 mg into the muscle once.    [provider]  lamoTRIgine (LAMICTAL) 25 MG tablet Take 50 mg by mouth 2 (two) times daily.     [provider]  linagliptin (TRADJENTA) 5 MG TABS tablet Take 5 mg by mouth daily.    [provider]  loratadine (CLARITIN) 10 MG tablet Take 10 mg by mouth daily.    [provider]  Melatonin 5 MG TABS Take 5 mg by mouth at bedtime.    [provider]  Menthol, Topical Analgesic, (BIOFREEZE ROLL-ON COLORLESS) 4 % GEL Apply 1 application topically daily as needed (FOR PAIN).     [provider]  metFORMIN (GLUCOPHAGE) 1000 MG tablet Take 1,000 mg by mouth 2 (two) times daily with a meal.     [provider]  montelukast (SINGULAIR) 10 MG tablet Take 1 tablet (10 mg total) by mouth at bedtime. 08/22/18   Kozlow, Donnamarie Poag, MD  pantoprazole (PROTONIX) 40 MG tablet Take 40 mg by mouth every morning.    [provider]  polyethylene glycol (MIRALAX / GLYCOLAX) packet Take 17 g by mouth daily.    [provider]  senna-docusate (SENOKOT-S) 8.6-50 MG tablet Take 1 tablet by mouth at bedtime.     [provider]  simvastatin (ZOCOR) 10 MG tablet Take 10 mg by mouth at bedtime.     [provider]  Sodium Fluoride (PREVIDENT 5000 BOOSTER) 1.1 % PSTE Place 1 application onto teeth at bedtime.     [provider]  traMADol (ULTRAM) 50 MG tablet Take 50 mg by mouth 2 (two) times daily.     [provider]  triamcinolone (NASACORT ALLERGY 24HR) 55 MCG/ACT AERO nasal inhaler Place 1  spray into the nose daily.    [provider]  valACYclovir (VALTREX) 500 MG tablet Take 500 mg by mouth daily.  05/05/16   [provider]  gabapentin (NEURONTIN) 300 MG capsule Take 300 mg by mouth 2 (two) times daily. Reported on 02/03/2016  04/18/16  [provider]    Allergies    Lisinopril, Penicillins, and Sulfa  antibiotics  Review of Systems   Review of Systems  Constitutional: Negative for fever.  Respiratory: Negative for shortness of breath.   Cardiovascular: Negative for chest pain.  Gastrointestinal: Positive for diarrhea, nausea and vomiting. Negative for abdominal pain and blood in stool.  Genitourinary: Negative for dysuria.  Neurological: Negative for headaches.  All other systems reviewed and are negative.   Physical Exam Updated Vital Signs BP (!) 172/82   Pulse (!) 101   Temp 97.8 F (36.6 C) (Oral)   Resp 16   Ht 1.549 m (5\' 1" )   Wt 84 kg   SpO2 98%   BMI 34.99 kg/m   Physical Exam Vitals and nursing note reviewed.  Constitutional:      Appearance: She is well-developed.     Comments: Elderly, frail appearing but nontoxic  HENT:     Head: Normocephalic and atraumatic.     Mouth/Throat:     Mouth: Mucous membranes are moist.  Eyes:     Pupils: Pupils are equal, round, and reactive to light.  Cardiovascular:     Rate and Rhythm: Normal rate and regular rhythm.     Heart sounds: Normal heart sounds.  Pulmonary:     Effort: Pulmonary effort is normal. No respiratory distress.     Breath sounds: No wheezing.  Abdominal:     General: Bowel sounds are normal.     Palpations: Abdomen is soft.     Tenderness: There is no abdominal tenderness.  Musculoskeletal:     Cervical back: Neck supple.  Skin:    General: Skin is warm and dry.  Neurological:     Mental Status: She is alert.     Comments: Oriented x2, 5 out of 5 strength in all 4 extremities  Psychiatric:        Mood and Affect: Mood normal.     ED Results /  Procedures / Treatments   Labs (all labs ordered are listed, but only abnormal results are displayed) Labs Reviewed  CBC WITH DIFFERENTIAL/PLATELET - Abnormal; Notable for the following components:      Result Value   WBC 16.6 (*)    RBC 5.26 (*)    Neutro Abs 12.8 (*)    Monocytes Absolute 1.4 (*)    Basophils Absolute 0.2 (*)    All other components within normal limits  COMPREHENSIVE METABOLIC PANEL - Abnormal; Notable for the following components:   Chloride 95 (*)    Glucose, Bld 168 (*)    Anion gap 17 (*)    All other components within normal limits  URINALYSIS, ROUTINE W REFLEX MICROSCOPIC - Abnormal; Notable for the following components:   Ketones, ur 5 (*)    Protein, ur 100 (*)    All other components within normal limits  LIPASE, BLOOD  POC SARS CORONAVIRUS 2 AG -  ED  TROPONIN I (HIGH SENSITIVITY)  TROPONIN I (HIGH SENSITIVITY)    EKG EKG Interpretation  Date/Time:  Wednesday December 18 2019 10:48:51 EST Ventricular Rate:  101 PR Interval:    QRS Duration: 70 QT Interval:  440 QTC Calculation: 571 R Axis:   -20 Text Interpretation: Sinus tachycardia Atrial premature complex Probable anteroseptal infarct, old Prolonged QT interval Artifact in lead(s) I II aVR aVL Technically poor tracing No significant change since last tracing Confirmed by [id number] (Ross Marcus) on 12/18/2019 2:26:42 PM   Radiology CT ABDOMEN PELVIS W CONTRAST  Result Date: 12/18/2019 CLINICAL DATA:  Nausea and vomiting for 3 days. EXAM: CT ABDOMEN  AND PELVIS WITH CONTRAST TECHNIQUE: Multidetector CT imaging of the abdomen and pelvis was performed using the standard protocol following bolus administration of intravenous contrast. CONTRAST:  OMNIPAQUE IOHEXOL 300 MG/ML  SOLN COMPARISON:  CT scan dated 03/27/2019 FINDINGS: Lower chest: No acute abnormality. Aortic atherosclerosis. Coronary artery calcifications. Hepatobiliary: No focal liver abnormality is seen. Status post cholecystectomy.  No biliary dilatation. Pancreas: Unremarkable. No pancreatic ductal dilatation or surrounding inflammatory changes. Spleen: Normal in size without focal abnormality. Adrenals/Urinary Tract: Adrenal glands are unremarkable. Kidneys are normal, without renal calculi, focal lesion, or hydronephrosis. Bladder is unremarkable. Stomach/Bowel: Slight edema of the mucosa of the distal antrum of the stomach. Appendix is not visible and may be surgically absent no evidence of bowel wall thickening, distention, or inflammatory changes. Vascular/Lymphatic: Aortic atherosclerosis. No enlarged abdominal or pelvic lymph nodes. Reproductive: Status post hysterectomy. No adnexal masses. Other: No abdominal wall hernia or abnormality. No abdominopelvic ascites. Musculoskeletal: No acute abnormality. Degenerative disc disease throughout the lumbar spine. IMPRESSION: Slight edema of the mucosa of the distal antrum of the stomach. This could represent gastritis. Otherwise, benign-appearing abdomen and pelvis. Electronically Signed   By: Francene Boyers M.D.   On: 12/18/2019 12:49   DG Chest Portable 1 View  Result Date: 12/18/2019 CLINICAL DATA:  Nausea and vomiting. EXAM: PORTABLE CHEST 1 VIEW COMPARISON:  06/18/2019 FINDINGS: The patient is mildly rotated to the right. The cardiomediastinal silhouette is unchanged with normal heart size. The lungs are hypoinflated with mild patchy opacity in the right upper lobe and hazy densities in both lung bases. No pleural effusion or pneumothorax is identified. No acute osseous abnormality is seen. IMPRESSION: Mild right upper lobe and bibasilar airspace opacities which could reflect early pneumonia (including atypical/viral infection). Electronically Signed   By: Sebastian Ache M.D.   On: 12/18/2019 11:26    Procedures Procedures (including critical care time)  Medications Ordered in ED Medications  sodium chloride 0.9 % bolus 500 mL (0 mLs Intravenous Stopped 12/18/19 1400)  iohexol  (OMNIPAQUE) 300 MG/ML solution 100 mL (100 mLs Intravenous Contrast Given 12/18/19 1208)    ED Course  I have reviewed the triage vital signs and the nursing notes.  Pertinent labs & imaging results that were available during my care of the patient were reviewed by me and considered in my medical decision making (see chart for details).    MDM Rules/Calculators/A&P                      Patient presents with concerns for high blood pressure and nausea and vomiting.  Recent diagnosis of UTI.  She is oriented x2.  Vital signs notable for elevated blood pressure and slight tachycardia at 101.  She is overall nontoxic-appearing.  Her abdomen is soft and nontender.  She did recently have a positive Covid test in mid December.  Per her daughter, she had a fairly unremarkable course and did not require hospitalization.  Urinalysis today improved.  She has a persistently mildly elevated leukocytosis to 16.  It was 14 2 days ago.  She is afebrile.  CMP notable for an anion gap of 17.  This is likely because of her hypochloremia.  She has a normal bicarbonate.  She only has a mildly elevated glucose.  Doubt DKA.  This could reflect mild dehydration.  Patient was given fluids.  EKG is unchanged from prior.  Chest x-ray is consistent with atypical infectious process.  She is not hypoxic.  She satting 98%  on room air and without respiratory distress.  This may represent her recent Covid diagnosis.  It may also lag behind clinically.  On recheck, she is without complaint.  She is tolerating fluids.  I had a long discussion with the patient's daughter who is her power of attorney.  She is concerned that she is not getting the care that she needs at [facility].  She is trying to get her transferred to [facility].  I discussed with her the work-up.  While she has some evidence of mild dehydration, it appears that her urinary tract infection is improving on Keflex.  No signs or symptoms of sepsis.  I have offered to have  social work evaluate to help with transfer and placement although I discussed with her that I did not feel that this was likely going to happen today.  Daughter is Adult nurse.  Regarding her high blood pressure, she was just started on medication.  Would not change acutely. Final Clinical Impression(s) / ED Diagnoses Final diagnoses:  Non-intractable vomiting with nausea, unspecified vomiting type  Essential hypertension    Rx / DC Orders ED Discharge Orders    None       Shon Baton, MD 12/18/19 1443

## 2019-12-18 NOTE — ED Notes (Signed)
Spoke with pt's daughter, Gwenlyn Fudge, updated on what has been done since pt arrival

## 2019-12-18 NOTE — ED Triage Notes (Signed)
Pt resident of [facility] and staff reports pt has had n/v/d x 3 days. Reports bp high today.  EMS says bp was 182/122.   Pt alert and oriented to self and place.  Denies any pain.

## 2019-12-18 NOTE — ED Notes (Signed)
Pt offered dinner try. Pt ate few bites of [place] and stuffing, and all of apples. Pt states has no taste and also dose not have her teeth with her.

## 2019-12-19 DIAGNOSIS — U071 COVID-19: Secondary | ICD-10-CM | POA: Diagnosis not present

## 2019-12-19 LAB — RESPIRATORY PANEL BY RT PCR (FLU A&B, COVID)
Influenza A by PCR: NEGATIVE
Influenza B by PCR: NEGATIVE
SARS Coronavirus 2 by RT PCR: POSITIVE — AB

## 2019-12-19 MED ORDER — ASPIRIN EC 81 MG PO TBEC
81.0000 mg | DELAYED_RELEASE_TABLET | Freq: Every day | ORAL | Status: DC
  Administered 2019-12-19 – 2019-12-22 (×4): 81 mg via ORAL
  Filled 2019-12-19 (×4): qty 1

## 2019-12-19 MED ORDER — DULOXETINE HCL 30 MG PO CPEP
60.0000 mg | ORAL_CAPSULE | Freq: Every day | ORAL | Status: DC
  Administered 2019-12-19 – 2019-12-22 (×4): 60 mg via ORAL
  Filled 2019-12-19 (×5): qty 2

## 2019-12-19 MED ORDER — AMLODIPINE BESYLATE 5 MG PO TABS
10.0000 mg | ORAL_TABLET | Freq: Every day | ORAL | Status: DC
  Administered 2019-12-19 – 2019-12-22 (×4): 10 mg via ORAL
  Filled 2019-12-19 (×5): qty 2

## 2019-12-19 MED ORDER — CEPHALEXIN 500 MG PO CAPS
500.0000 mg | ORAL_CAPSULE | Freq: Two times a day (BID) | ORAL | Status: DC
  Administered 2019-12-19: 500 mg via ORAL
  Filled 2019-12-19: qty 1

## 2019-12-19 MED ORDER — LOPERAMIDE HCL 2 MG PO CAPS
4.0000 mg | ORAL_CAPSULE | Freq: Once | ORAL | Status: AC
  Administered 2019-12-19: 4 mg via ORAL
  Filled 2019-12-19: qty 2

## 2019-12-19 MED ORDER — METFORMIN HCL 500 MG PO TABS
1000.0000 mg | ORAL_TABLET | Freq: Two times a day (BID) | ORAL | Status: DC
  Administered 2019-12-19 – 2019-12-22 (×6): 1000 mg via ORAL
  Filled 2019-12-19 (×6): qty 2

## 2019-12-19 MED ORDER — VALACYCLOVIR HCL 500 MG PO TABS
500.0000 mg | ORAL_TABLET | Freq: Every day | ORAL | Status: DC
  Administered 2019-12-19 – 2019-12-22 (×4): 500 mg via ORAL
  Filled 2019-12-19 (×5): qty 1

## 2019-12-19 MED ORDER — TRAMADOL HCL 50 MG PO TABS
50.0000 mg | ORAL_TABLET | Freq: Two times a day (BID) | ORAL | Status: DC
  Administered 2019-12-19 – 2019-12-23 (×7): 50 mg via ORAL
  Filled 2019-12-19 (×7): qty 1

## 2019-12-19 MED ORDER — LAMOTRIGINE 25 MG PO TABS
50.0000 mg | ORAL_TABLET | Freq: Two times a day (BID) | ORAL | Status: DC
  Administered 2019-12-19 – 2019-12-22 (×6): 50 mg via ORAL
  Filled 2019-12-19 (×6): qty 2

## 2019-12-19 MED ORDER — LINAGLIPTIN 5 MG PO TABS
5.0000 mg | ORAL_TABLET | Freq: Every day | ORAL | Status: DC
  Administered 2019-12-19 – 2019-12-22 (×3): 5 mg via ORAL
  Filled 2019-12-19 (×8): qty 1

## 2019-12-19 MED ORDER — SIMVASTATIN 10 MG PO TABS
10.0000 mg | ORAL_TABLET | Freq: Every day | ORAL | Status: DC
  Administered 2019-12-19 – 2019-12-20 (×2): 10 mg via ORAL
  Filled 2019-12-19 (×2): qty 1

## 2019-12-19 MED ORDER — PANTOPRAZOLE SODIUM 40 MG PO TBEC
40.0000 mg | DELAYED_RELEASE_TABLET | ORAL | Status: DC
  Administered 2019-12-19 – 2019-12-21 (×3): 40 mg via ORAL
  Filled 2019-12-19 (×4): qty 1

## 2019-12-19 MED ORDER — LORATADINE 10 MG PO TABS
10.0000 mg | ORAL_TABLET | Freq: Every day | ORAL | Status: DC
  Administered 2019-12-19 – 2019-12-22 (×4): 10 mg via ORAL
  Filled 2019-12-19 (×5): qty 1

## 2019-12-19 MED ORDER — NITROFURANTOIN MONOHYD MACRO 100 MG PO CAPS
100.0000 mg | ORAL_CAPSULE | Freq: Two times a day (BID) | ORAL | Status: DC
  Administered 2019-12-19 – 2019-12-22 (×6): 100 mg via ORAL
  Filled 2019-12-19 (×7): qty 1

## 2019-12-19 NOTE — Care Management (Signed)
Spoke with Maralyn Sago at [facility], demographics and pt info being provided for assessment. Adminission's coordinator will need to come to ED to see pt face to face for assessment.This will happen today is [employer] will allow, if not will need to happen electronically. Plan communicated with RN. Will cont to follow.

## 2019-12-19 NOTE — NC FL2 (Signed)
[place] MEDICAID FL2 LEVEL OF CARE SCREENING TOOL     IDENTIFICATION  Patient Name: [name] [name] Birthdate: Jun 30, 1941 Sex: female Admission Date (Current Location): 12/18/2019  [facility] and [place] Number:  [employer] and Address:  [facility],  [address] [place] [address]      Provider Number: [id number]  Attending Physician Name and Address:  Default, Provider, MD  Relative Name and Phone Number:  Lequita Halt [facility]: [phone]    Current Level of Care: Hospital Recommended Level of Care: Skilled Nursing Facility Prior Approval Number:    Date Approved/Denied: 12/20/18 PASRR Number: [id number]  Discharge Plan: SNF    Current Diagnoses: Patient Active Problem List   Diagnosis Date Noted  . Cord compression (HCC) 12/15/2018  . Small bowel obstruction (HCC) 04/27/2016  . Non-insulin dependent type 2 diabetes mellitus (HCC) 04/27/2016  . Depression 04/27/2016  . Anxiety state 04/27/2016  . GERD (gastroesophageal reflux disease) 04/27/2016  . SBO (small bowel obstruction) (HCC) 04/27/2016  . Generalized weakness 02/03/2016  . Essential hypertension 02/03/2016  . Urinary frequency 01/04/2016  . Pelvic pressure in female 01/04/2016  . Hematuria 01/04/2016  . Herpes 12/22/2015    Orientation RESPIRATION BLADDER Height & Weight     Self  O2(2L) Incontinent Weight: 185 lb 3 oz (84 kg) Height:  5\' 1"  (154.9 cm)  BEHAVIORAL SYMPTOMS/MOOD NEUROLOGICAL BOWEL NUTRITION STATUS      Incontinent    AMBULATORY STATUS COMMUNICATION OF NEEDS Skin   Extensive Assist Verbally Normal                       Personal Care Assistance Level of Assistance  Bathing, Feeding, Dressing Bathing Assistance: Limited assistance Feeding assistance: Limited assistance Dressing Assistance: Limited assistance     Functional Limitations Info  Sight, Hearing, Speech Sight Info: Impaired Hearing Info: Impaired(Has hearing Aids) Speech Info: Adequate     SPECIAL CARE FACTORS FREQUENCY  PT (By licensed PT), OT (By licensed OT)     PT Frequency: 3x weekly OT Frequency: 3x weekly            Contractures Contractures Info: Not present    Additional Factors Info  Code Status, Allergies Code Status Info: Full Code Allergies Info: Lisinopril, Penicillins, Sulfa Antibiotics           Current Medications (12/19/2019):  This is the current hospital active medication list Current Facility-Administered Medications  Medication Dose Route Frequency Provider Last Rate Last Admin  . acetaminophen (TYLENOL) tablet 650 mg  650 mg Oral Q4H PRN 02/16/2020, MD   650 mg at 12/18/19 1846  . amLODipine (NORVASC) tablet 10 mg  10 mg Oral Daily 02/15/20, MD   10 mg at 12/19/19 1025  . aspirin EC tablet 81 mg  81 mg Oral Daily 02/16/20, MD   81 mg at 12/19/19 1026  . DULoxetine (CYMBALTA) DR capsule 60 mg  60 mg Oral Daily 02/16/20, MD   60 mg at 12/19/19 1026  . lamoTRIgine (LAMICTAL) tablet 50 mg  50 mg Oral BID 02/16/20, MD   50 mg at 12/19/19 1026  . linagliptin (TRADJENTA) tablet 5 mg  5 mg Oral Daily 02/16/20, MD   5 mg at 12/19/19 1230  . loratadine (CLARITIN) tablet 10 mg  10 mg Oral Daily 02/16/20, MD   10 mg at 12/19/19 1025  . metFORMIN (GLUCOPHAGE) tablet 1,000 mg  1,000 mg Oral  BID WC Terrilee Files, MD      . miconazole (MONISTAT 7) 2 % vaginal cream 1 Applicatorful  1 Applicatorful Vaginal QHS Tilda Burrow, MD      . nitrofurantoin (macrocrystal-monohydrate) (MACROBID) capsule 100 mg  100 mg Oral Q12H Terrilee Files, MD   100 mg at 12/19/19 1230  . pantoprazole (PROTONIX) EC tablet 40 mg  40 mg Oral Cathi Roan, MD   40 mg at 12/19/19 1025  . simvastatin (ZOCOR) tablet 10 mg  10 mg Oral QHS Terrilee Files, MD      . traMADol Janean Sark) tablet 50 mg  50 mg Oral BID Terrilee Files, MD   50 mg at 12/19/19 1026  . valACYclovir (VALTREX) tablet 500 mg  500  mg Oral Daily Terrilee Files, MD   500 mg at 12/19/19 1026   Current Outpatient Medications  Medication Sig Dispense Refill  . acetaminophen (TYLENOL) 650 MG CR tablet Take 650 mg by mouth every 8 (eight) hours as needed for pain.    Marland Kitchen amLODipine (NORVASC) 10 MG tablet Take 1 tablet (10 mg total) by mouth daily. 30 tablet 0  . aspirin EC 81 MG tablet Take 81 mg by mouth daily.    . cephALEXin (KEFLEX) 500 MG capsule Take 1 capsule (500 mg total) by mouth 2 (two) times daily. 10 capsule 0  . DULoxetine (CYMBALTA) 60 MG capsule Take 60 mg by mouth daily.     . Emollient (EUCERIN) lotion Apply topically 4 (four) times daily as needed for dry skin.    Marland Kitchen EPINEPHrine (EPIPEN 2-PAK) 0.3 mg/0.3 mL IJ SOAJ injection Inject 0.3 mg into the muscle once.    . lamoTRIgine (LAMICTAL) 25 MG tablet Take 50 mg by mouth 2 (two) times daily.     Marland Kitchen linagliptin (TRADJENTA) 5 MG TABS tablet Take 5 mg by mouth daily.    Marland Kitchen loratadine (CLARITIN) 10 MG tablet Take 10 mg by mouth daily.    . Melatonin 5 MG TABS Take 5 mg by mouth at bedtime.    . Menthol, Topical Analgesic, (BIOFREEZE ROLL-ON COLORLESS) 4 % GEL Apply 1 application topically daily as needed (FOR PAIN).     Marland Kitchen metFORMIN (GLUCOPHAGE) 1000 MG tablet Take 1,000 mg by mouth 2 (two) times daily with a meal.     . montelukast (SINGULAIR) 10 MG tablet Take 1 tablet (10 mg total) by mouth at bedtime. 30 tablet 5  . nystatin (NYSTATIN) powder Apply 1 application topically 3 (three) times daily.    . pantoprazole (PROTONIX) 40 MG tablet Take 40 mg by mouth every morning.    . polyethylene glycol (MIRALAX / GLYCOLAX) packet Take 17 g by mouth daily.    Marland Kitchen senna-docusate (SENOKOT-S) 8.6-50 MG tablet Take 1 tablet by mouth at bedtime.     . simvastatin (ZOCOR) 10 MG tablet Take 10 mg by mouth at bedtime.     . Sodium Fluoride (PREVIDENT 5000 BOOSTER) 1.1 % PSTE Place 1 application onto teeth at bedtime.     . traMADol (ULTRAM) 50 MG tablet Take 50 mg by mouth 2  (two) times daily.     Marland Kitchen triamcinolone (NASACORT ALLERGY 24HR) 55 MCG/ACT AERO nasal inhaler Place 1 spray into the nose daily.    . valACYclovir (VALTREX) 500 MG tablet Take 500 mg by mouth daily.     Marland Kitchen DHA-Vitamin C-Lutein (EYE HEALTH FORMULA PO) Take 1 tablet by mouth daily.  Discharge Medications: Please see discharge summary for a list of discharge medications.  Relevant Imaging Results:  Relevant Lab Results:   Additional Information SSN: [id number]  [place], LCSW

## 2019-12-19 NOTE — ED Notes (Signed)
PT at the bedside.

## 2019-12-19 NOTE — Evaluation (Signed)
Physical Therapy Evaluation Patient Details Name: [name] [name] MRN: [id number] DOB: [birth year] Today's Date: 12/19/2019   History of Present Illness  [name] is a 79 y.o. female with history of HTN who presents with high blood pressure. She is a resident at [facility] assisted living and was diagnosed with COVID-19 one week ago. She states that today she was working with PT today and her BP was noted to be >242 systolic and >353 diastolic. On review of EMR BP typically ranges from 614-431 systolic over the past year. She does not think she takes any BP meds. EMS was called and she was transported to the ED. She denies any symptoms. Last night she was having body aches and her sciatica in there L leg was bothering her. Today this is better. She denies fever, chills, headache, chest pain, SOB, abdominal pain. She reports a "little" cough. Staff states that they called EMS bc the patient was complaining of a headache at their facility. They confirm she is not on BP meds.    Clinical Impression  Patient demonstrates slow labored movement for sitting up at bedside with frequent leaning backwards, limited to a few slow unsteady side steps at bedside due to BLE weakness and severe fall risk.  Patient required Max assist to put back to bed and reposition - RN notified.  Patient will benefit from continued physical therapy in hospital and recommended venue below to increase strength, balance, endurance for safe ADLs and gait.    Follow Up Recommendations SNF;Supervision/Assistance - 24 hour;Supervision for mobility/OOB    Equipment Recommendations  None recommended by PT    Recommendations for Other Services       Precautions / Restrictions Precautions Precautions: Fall Restrictions Weight Bearing Restrictions: No      Mobility  Bed Mobility Overal bed mobility: Needs Assistance Bed Mobility: Supine to Sit;Sit to Supine     Supine to sit: Mod assist Sit to supine: Mod assist;Max  assist   General bed mobility comments: slow labored movement  Transfers Overall transfer level: Needs assistance Equipment used: Rolling walker (2 wheeled) Transfers: Sit to/from Stand Sit to Stand: Mod assist;Max assist         General transfer comment: very unsteady on feet, unable to step away from bedside  Ambulation/Gait Ambulation/Gait assistance: Mod assist;Max assist Gait Distance (Feet): 3 Feet Assistive device: Rolling walker (2 wheeled) Gait Pattern/deviations: Decreased step length - right;Decreased step length - left;Decreased stride length Gait velocity: slow   General Gait Details: limited to 3-4 slow unsteady short steps at bedside due to fall risk  Stairs            Wheelchair Mobility    Modified Rankin (Stroke Patients Only)       Balance Overall balance assessment: Needs assistance Sitting-balance support: Bilateral upper extremity supported;Feet supported Sitting balance-Leahy Scale: Fair Sitting balance - Comments: seated at EOB   Standing balance support: Bilateral upper extremity supported;During functional activity Standing balance-Leahy Scale: Poor Standing balance comment: using RW                             Pertinent Vitals/Pain Pain Assessment: No/denies pain    Home Living Family/patient expects to be discharged to:: Assisted living               Home Equipment: Walker - 2 wheels      Prior Function Level of Independence: Needs assistance   Gait / Transfers  Assistance Needed: household ambulator with supervision  ADL's / Homemaking Assistance Needed: assisted by ALF staff        Hand Dominance        Extremity/Trunk Assessment   Upper Extremity Assessment Upper Extremity Assessment: Generalized weakness    Lower Extremity Assessment Lower Extremity Assessment: Generalized weakness       Communication   Communication: No difficulties  Cognition Arousal/Alertness: Awake/alert Behavior  During Therapy: WFL for tasks assessed/performed Overall Cognitive Status: Within Functional Limits for tasks assessed                                        General Comments      Exercises     Assessment/Plan    PT Assessment Patient needs continued PT services  PT Problem List Decreased strength;Decreased activity tolerance;Decreased balance;Decreased mobility       PT Treatment Interventions DME instruction;Gait training;Stair training;Functional mobility training;Therapeutic activities;Therapeutic exercise;Patient/family education    PT Goals (Current goals can be found in the Care Plan section)  Acute Rehab PT Goals Patient Stated Goal: return home after rehab PT Goal Formulation: With patient Time For Goal Achievement: 01/02/20 Potential to Achieve Goals: Good    Frequency Min 2X/week   Barriers to discharge        Co-evaluation               AM-PAC PT "6 Clicks" Mobility  Outcome Measure Help needed turning from your back to your side while in a flat bed without using bedrails?: A Lot Help needed moving from lying on your back to sitting on the side of a flat bed without using bedrails?: A Lot Help needed moving to and from a bed to a chair (including a wheelchair)?: A Lot Help needed standing up from a chair using your arms (e.g., wheelchair or bedside chair)?: A Lot Help needed to walk in hospital room?: A Lot Help needed climbing 3-5 steps with a railing? : Total 6 Click Score: 11    End of Session   Activity Tolerance: Patient tolerated treatment well;Patient limited by fatigue Patient left: in bed;with call bell/phone within reach Nurse Communication: Mobility status PT Visit Diagnosis: Unsteadiness on feet (R26.81);Other abnormalities of gait and mobility (R26.89);Muscle weakness (generalized) (M62.81)    Time: 3382-5053 PT Time Calculation (min) (ACUTE ONLY): 28 min   Charges:   PT Evaluation $PT Eval Moderate Complexity: 1  Mod PT Treatments $Therapeutic Activity: 23-37 mins        4:09 PM, 12/19/19 Ocie Bob, MPT Physical Therapist with [facility] [phone] office [phone] mobile phone

## 2019-12-19 NOTE — Care Management (Signed)
TOC aware family does not want pt to return to [facility] and is in process of moving pt to [facility]. CM spoke with Daughter Corrie Dandy this AM. Expectation set that we will attempt to have pt relocate, however, if pt becomes medically ready before that can be accomplished pt will have to return to previous facility (or home with dtr) until transfer can be completed. TOC will contact [facility] rep to discuss expedited admission.

## 2019-12-19 NOTE — Plan of Care (Signed)
  Problem: Acute Rehab PT Goals(only PT should resolve) Goal: Pt Will Go Supine/Side To Sit Outcome: Progressing Flowsheets (Taken 12/19/2019 1610) Pt will go Supine/Side to Sit:  with minimal assist  with moderate assist Goal: Patient Will Transfer Sit To/From Stand Outcome: Progressing Flowsheets (Taken 12/19/2019 1610) Patient will transfer sit to/from stand:  with minimal assist  with moderate assist Goal: Pt Will Transfer Bed To Chair/Chair To Bed Outcome: Progressing Flowsheets (Taken 12/19/2019 1610) Pt will Transfer Bed to Chair/Chair to Bed:  with min assist  with mod assist Goal: Pt Will Ambulate Outcome: Progressing Flowsheets (Taken 12/19/2019 1610) Pt will Ambulate:  25 feet  with minimal assist  with moderate assist  with rolling walker   4:11 PM, 12/19/19 Ocie Bob, MPT Physical Therapist with [facility] [phone] office [phone] mobile phone

## 2019-12-19 NOTE — TOC Initial Note (Signed)
Transition of Care [facility]) - Initial/Assessment Note    Patient Details  Name: [name] [name] MRN: [id number] Date of Birth: April 05, 1941  Transition of Care [facility]) CM/SW Contact:    Odessa Nishi Sherryle Lis, LCSW Phone Number: 12/19/2019, 5:05 PM  Clinical Narrative:   Pt discharged from [facility] Assisted living facility. Patient now being worked up for SNF for short term rehab placement.   CSW in contact with patients granddaughter Lequita Halt [facility]: [phone] to discuss PT recommendation and bed placement. Lequita Halt suggests sending referral to [facility] and [facility]. CSW sent referral of those preferred locations. CSW in contact with Gala Romney, admissions coordinator for [facility]. Gala Romney explains that they do not currently have a bed available but may be looking forward to opening some up on tomorrow 12/20/2019. Gala Romney states that he will be in contact with CSW tomorrow morning to discuss bed offers.   TOC team will continue following patient for discharge related needs  Kalyn Hofstra Tomma Rakers Transitions of Care  Clinical Social Worker  Ph: [phone]              Expected Discharge Plan: Skilled Nursing Facility Barriers to Discharge: SNF Pending bed offer, Continued Medical Work up   Patient Goals and CMS Choice Patient states their goals for this hospitalization and ongoing recovery are:: to discharge to SNF for short term rehab CMS Medicare.gov Compare Post Acute Care list provided to:: Patient Represenative (must comment)    Expected Discharge Plan and Services Expected Discharge Plan: Skilled Nursing Facility In-house Referral: Clinical Social Work   Post Acute Care Choice: Skilled Nursing Facility Living arrangements for the past 2 months: Assisted Living Facility                                      Prior Living Arrangements/Services Living arrangements for the past 2 months: Assisted Living Facility Lives with:: Facility Resident Patient language and need for  interpreter reviewed:: Yes Do you feel safe going back to the place where you live?: Yes      Need for Family Participation in Patient Care: Yes (Comment) Care giver support system in place?: Yes (comment)   Criminal Activity/Legal Involvement Pertinent to Current Situation/Hospitalization: No - Comment as needed  Activities of Daily Living      Permission Sought/Granted Permission sought to share information with : Family Supports, Case Manager Permission granted to share information with : Yes, Verbal Permission Granted  Share Information with NAME: Lequita Halt  Permission granted to share info w AGENCY: [facility]  Permission granted to share info w Relationship: Granddaughter  Permission granted to share info w Contact Information: PH: [phone]  Emotional Assessment Appearance:: Appears stated age Attitude/Demeanor/Rapport: Unable to Assess Affect (typically observed): Unable to Assess Orientation: : Oriented to Self Alcohol / Substance Use: Not Applicable Psych Involvement: No (comment)  Admission diagnosis:  sob Patient Active Problem List   Diagnosis Date Noted  . Cord compression (HCC) 12/15/2018  . Small bowel obstruction (HCC) 04/27/2016  . Non-insulin dependent type 2 diabetes mellitus (HCC) 04/27/2016  . Depression 04/27/2016  . Anxiety state 04/27/2016  . GERD (gastroesophageal reflux disease) 04/27/2016  . SBO (small bowel obstruction) (HCC) 04/27/2016  . Generalized weakness 02/03/2016  . Essential hypertension 02/03/2016  . Urinary frequency 01/04/2016  . Pelvic pressure in female 01/04/2016  . Hematuria 01/04/2016  . Herpes 12/22/2015   PCP:  Avon Gully, MD Pharmacy:  [facility] - [place], [place] - [address] [place] [address] Phone: [phone] Fax: [phone]     Social Determinants of Health (SDOH) Interventions    Readmission Risk Interventions No flowsheet data found.

## 2019-12-19 NOTE — Progress Notes (Signed)
ED Antimicrobial Stewardship Positive Culture Follow Up   [name] [name] is an 79 y.o. female who presented to [facility] on 12/16/2019 with a chief complaint of  Chief Complaint  Patient presents with  . Hypertension    Recent Results (from the past 720 hour(s))  Urine culture     Status: Abnormal   Collection Time: 12/16/19  4:58 PM   Specimen: Urine, Random  Result Value Ref Range Status   Specimen Description   Final    URINE, RANDOM Performed at [facility], [address], [place], [place] [address]    Special Requests   Final    NONE Performed at [facility], [address], [place], [place] [address]    Culture >=100,000 COLONIES/mL Vicenta Aly (A)  Final   Report Status 12/18/2019 FINAL  Final   Organism ID, Bacteria CITROBACTER YOUNGAE (A)  Final      Susceptibility   Citrobacter youngae - MIC*    CEFAZOLIN >=64 RESISTANT Resistant     CEFTRIAXONE <=0.25 SENSITIVE Sensitive     CIPROFLOXACIN <=0.25 SENSITIVE Sensitive     GENTAMICIN <=1 SENSITIVE Sensitive     IMIPENEM <=0.25 SENSITIVE Sensitive     NITROFURANTOIN 32 SENSITIVE Sensitive     TRIMETH/SULFA <=20 SENSITIVE Sensitive     PIP/TAZO <=4 SENSITIVE Sensitive     * >=100,000 COLONIES/mL CITROBACTER YOUNGAE    [x]  Treated with cephalexin, organism resistant to prescribed antimicrobial  Patient found to be admitted [facility] ED.  Pharmacy team informed EDP of drug-bug mismatch and antibiotics were changed to MacroBid.   [facility] 12/19/2019, 2:52 PM Clinical Pharmacist Monday - Friday phone -  [phone] Saturday - Sunday phone - [phone]

## 2019-12-20 DIAGNOSIS — U071 COVID-19: Secondary | ICD-10-CM | POA: Diagnosis not present

## 2019-12-20 LAB — BASIC METABOLIC PANEL
Anion gap: 14 (ref 5–15)
BUN: 33 mg/dL — ABNORMAL HIGH (ref 8–23)
CO2: 25 mmol/L (ref 22–32)
Calcium: 9.7 mg/dL (ref 8.9–10.3)
Chloride: 100 mmol/L (ref 98–111)
Creatinine, Ser: 1.03 mg/dL — ABNORMAL HIGH (ref 0.44–1.00)
GFR calc Af Amer: >60 mL/min (ref >60)
GFR calc non Af Amer: 52 mL/min — ABNORMAL LOW (ref >60)
Glucose, Bld: 152 mg/dL — ABNORMAL HIGH (ref 70–99)
Potassium: 3.8 mmol/L (ref 3.5–5.1)
Sodium: 139 mmol/L (ref 135–145)

## 2019-12-20 LAB — CBC
HCT: 48.2 % — ABNORMAL HIGH (ref 36.0–46.0)
Hemoglobin: 14.8 g/dL (ref 12.0–15.0)
MCH: 27.8 pg (ref 26.0–34.0)
MCHC: 30.7 g/dL (ref 30.0–36.0)
MCV: 90.4 fL (ref 80.0–100.0)
Platelets: 296 10*3/uL (ref 150–400)
RBC: 5.33 MIL/uL — ABNORMAL HIGH (ref 3.87–5.11)
RDW: 15.2 % (ref 11.5–15.5)
WBC: 12.9 10*3/uL — ABNORMAL HIGH (ref 4.0–10.5)
nRBC: 0 % (ref 0.0–0.2)

## 2019-12-20 LAB — CBG MONITORING, ED: Glucose-Capillary: 135 mg/dL — ABNORMAL HIGH (ref 70–99)

## 2019-12-20 MED ORDER — SODIUM CHLORIDE 0.9 % IV BOLUS
500.0000 mL | Freq: Once | INTRAVENOUS | Status: AC
  Administered 2019-12-20: 500 mL via INTRAVENOUS

## 2019-12-20 NOTE — ED Notes (Signed)
Pt has slept most of the day. Only urine output for today was 300 mL or dark urine. Am going to order a CBC and BMP.

## 2019-12-20 NOTE — ED Notes (Signed)
PT in room 

## 2019-12-20 NOTE — ED Notes (Signed)
Pt had a BM when this nurse walked in. Pt has been cleaned up and given lunch. VSS. O2 sats 100 on RA

## 2019-12-20 NOTE — Progress Notes (Signed)
Limited by fatigue: Attempted PT session.  Pt unable to keep eyes open or complete sentences before going back to sleep.    [address], LPTA; CBIS [phone]

## 2019-12-20 NOTE — ED Provider Notes (Signed)
Pt in process of getting placement to [facility] from [facility].  Pt had a + covid test on 12/14.  There is concern because she had a negative covid test on 1/6 and a positive test on 1/7.  This is because the test on the 6th was an antigen test and only tests for a current viral infection.  The PCR test which was positive is much more sensitive and measures for any viral DNA left over.  According to the CDC, a patient is ok recovered from covid 10 days after symptoms first appear, if pt's sx have improved, and no fever within 24 hrs without taking tylenol and ibuprofen.  Pt is asymptomatic from covid here.  She is eating and drinking and saturating normally.   Jacalyn Lefevre, MD 12/21/19 2128

## 2019-12-20 NOTE — Progress Notes (Signed)
CSW in contact with Gala Romney from [facility] who states that they currently have no beds available. TOC team will continue to seek placement for this patient.   Osiris Charles Sherryle Lis LCSWA Transitions of Care  Clinical Social Worker  Ph: [phone]

## 2019-12-21 DIAGNOSIS — U071 COVID-19: Secondary | ICD-10-CM | POA: Diagnosis not present

## 2019-12-21 NOTE — ED Notes (Signed)
Placed into hospital bed.

## 2019-12-22 DIAGNOSIS — U071 COVID-19: Secondary | ICD-10-CM | POA: Diagnosis not present

## 2019-12-22 NOTE — ED Notes (Signed)
CONTACT INFO:  Corrie Dandy (daughter): [phone]

## 2019-12-22 NOTE — ED Notes (Signed)
Pt confused climbing out of bed she is stripping off all clothing she is reoriented and will continue to monitor

## 2019-12-22 NOTE — ED Notes (Signed)
Pt stated she didn't want any meds and refused her night doses

## 2019-12-23 ENCOUNTER — Inpatient Hospital Stay
Admission: RE | Admit: 2019-12-23 | Discharge: 2022-07-02 | Disposition: A | Discharge status: Other Acute Inpatient Hospital | Payer: Medicare Other | Source: Ambulatory Visit | Attending: Internal Medicine | Admitting: Internal Medicine

## 2019-12-23 DIAGNOSIS — U071 COVID-19: Secondary | ICD-10-CM | POA: Diagnosis not present

## 2019-12-23 DIAGNOSIS — M5432 Sciatica, left side: Principal | ICD-10-CM

## 2019-12-23 LAB — CBG MONITORING, ED: Glucose-Capillary: 141 mg/dL — ABNORMAL HIGH (ref 70–99)

## 2019-12-23 NOTE — ED Notes (Signed)
Pt awake and alert, pulling off bp cuff and restless in bed

## 2019-12-23 NOTE — TOC Transition Note (Signed)
Transition of Care [facility]) - CM/SW Discharge Note   Patient Details  Name: [name] [name] MRN: [id number] Date of Birth: October 31, 1941  Transition of Care [facility]) CM/SW Contact:  Jonnette Nuon Sherryle Lis, LCSW Phone Number: 12/23/2019, 2:58 PM   Clinical Narrative:  Patient discharging to [facility] today. AVS report has been faxed to [facility], Admissions coordinator at facility. Family was made aware of transfer.  Terilyn Sano Sherryle Lis LCSWA Transitions of Care  Clinical Social Worker  Ph: [phone]       Final next level of care: Skilled Nursing Facility Barriers to Discharge: No Barriers Identified   Patient Goals and CMS Choice Patient states their goals for this hospitalization and ongoing recovery are:: to discharge to SNF for short term rehab CMS Medicare.gov Compare Post Acute Care list provided to:: Patient Represenative (must comment)    Discharge Placement              Patient chooses bed at: [facility] Patient to be transferred to facility by: [facility] Name of family member notified: Leonides Schanz [phone] Patient and family notified of of transfer: 12/23/19  Discharge Plan and Services In-house Referral: Clinical Social Work   Post Acute Care Choice: Skilled Nursing Facility                               Social Determinants of Health (SDOH) Interventions     Readmission Risk Interventions No flowsheet data found.

## 2019-12-23 NOTE — Discharge Instructions (Signed)
Your testing today has been unremarkable, please continue your daily medications at home and return to the emergency department only for severe or worsening symptoms.  You should follow-up with your doctor within 72 hours for recheck

## 2019-12-23 NOTE — ED Provider Notes (Signed)
Patient has been seen by social work  Location manager at [facility] hopefully  AVS completed   Eber Hong, MD 12/23/19 [phone]

## 2019-12-23 NOTE — ED Notes (Signed)
Awaiting transport.

## 2019-12-24 ENCOUNTER — Encounter: Payer: Self-pay | Admitting: Adult Health

## 2019-12-24 ENCOUNTER — Non-Acute Institutional Stay (SKILLED_NURSING_FACILITY): Payer: Medicare Other | Admitting: Adult Health

## 2019-12-24 DIAGNOSIS — E785 Hyperlipidemia, unspecified: Secondary | ICD-10-CM

## 2019-12-24 DIAGNOSIS — E1169 Type 2 diabetes mellitus with other specified complication: Secondary | ICD-10-CM | POA: Diagnosis not present

## 2019-12-24 DIAGNOSIS — K219 Gastro-esophageal reflux disease without esophagitis: Secondary | ICD-10-CM

## 2019-12-24 DIAGNOSIS — E119 Type 2 diabetes mellitus without complications: Secondary | ICD-10-CM | POA: Diagnosis not present

## 2019-12-24 DIAGNOSIS — N390 Urinary tract infection, site not specified: Secondary | ICD-10-CM | POA: Insufficient documentation

## 2019-12-24 DIAGNOSIS — E1159 Type 2 diabetes mellitus with other circulatory complications: Secondary | ICD-10-CM

## 2019-12-24 DIAGNOSIS — B9620 Unspecified Escherichia coli [E. coli] as the cause of diseases classified elsewhere: Secondary | ICD-10-CM | POA: Insufficient documentation

## 2019-12-24 DIAGNOSIS — B009 Herpesviral infection, unspecified: Secondary | ICD-10-CM

## 2019-12-24 DIAGNOSIS — J3089 Other allergic rhinitis: Secondary | ICD-10-CM | POA: Insufficient documentation

## 2019-12-24 DIAGNOSIS — K5909 Other constipation: Secondary | ICD-10-CM | POA: Insufficient documentation

## 2019-12-24 DIAGNOSIS — I10 Essential (primary) hypertension: Secondary | ICD-10-CM

## 2019-12-24 DIAGNOSIS — F339 Major depressive disorder, recurrent, unspecified: Secondary | ICD-10-CM

## 2019-12-24 NOTE — Progress Notes (Signed)
Location:    [facility] Room Number: [phone] Place of Service:  SNF (31) Carleene Overlie NP    CODE STATUS: FULL CODE  Allergies  Allergen Reactions  . Lisinopril   . Penicillins Nausea Only    Has patient had a PCN reaction causing immediate rash, facial/tongue/throat swelling, SOB or lightheadedness with hypotension: Yes Has patient had a PCN reaction causing severe rash involving mucus membranes or skin necrosis: No Has patient had a PCN reaction that required hospitalization No Has patient had a PCN reaction occurring within the last 10 years: No If all of the above answers are "NO", then may proceed with Cephalosporin use.   . Sulfa Antibiotics Nausea And Vomiting    Chief Complaint  Patient presents with  . Hospitalization Follow-up    Hospitalization follow-up    HPI:  She is a long term resident of assisted living. She has had a recent infection of covid 19. She presented to the ED twice over the past several days. She is being treated for an uti. Her appetite has been poor in the assisted living. There are no reports of uncontrolled pain. She is confused and will become anxious at times. Her goal is to return back to her assisted living. She continues to be followed for her chronic illnesses including: hypertension; diabetes; rhinitis.    Past Medical History:  Diagnosis Date  . Anxiety   . Bowel obstruction (HCC)   . Burning with urination 01/04/2016  . Diabetes mellitus without complication (HCC)   . Genital herpes   . GERD (gastroesophageal reflux disease)   . Hematuria 01/04/2016  . Herpes 12/22/2015  . Hyperlipidemia   . Hypertension   . LLQ abdominal tenderness 01/04/2016  . Neuropathy   . Pelvic pressure in female 01/04/2016  . Recurrent UTI   . Sciatic nerve disease   . Urinary frequency 01/04/2016  . Urticaria     Past Surgical History:  Procedure Laterality Date  . ABDOMINAL HYSTERECTOMY    . APPLICATION OF INTRAOPERATIVE CT  SCAN N/A 12/19/2018   Procedure: APPLICATION OF INTRAOPERATIVE CT SCAN;  Surgeon: Maeola Harman, MD;  Location: [facility] OR;  Service: Neurosurgery;  Laterality: N/A;  . bowel obstruction    . COLON SURGERY     blocked colon  . POSTERIOR CERVICAL FUSION/FORAMINOTOMY N/A 12/19/2018   Procedure: Cervical Seven to Thoracic One Posterior cervical fusion;  Surgeon: Maeola Harman, MD;  Location: [facility] OR;  Service: Neurosurgery;  Laterality: N/A;  Cervical Seven to Thoracic One Posterior cervical fusion    Social History   Socioeconomic History  . Marital status: Single    Spouse name: Not on file  . Number of children: Not on file  . Years of education: Not on file  . Highest education level: Not on file  Occupational History  . Not on file  Tobacco Use  . Smoking status: Never Smoker  . Smokeless tobacco: Current User    Types: Snuff  Substance and Sexual Activity  . Alcohol use: No  . Drug use: No  . Sexual activity: Not Currently    Birth control/protection: Surgical    Comment: hyst  Other Topics Concern  . Not on file  Social History Narrative  . Not on file   Social Determinants of Health   Financial Resource Strain:   . Difficulty of Paying Living Expenses: Not on file  Food Insecurity:   . Worried About Programme researcher, broadcasting/film/video in the Last Year: Not on  file  . Ran Out of Food in the Last Year: Not on file  Transportation Needs:   . Lack of Transportation (Medical): Not on file  . Lack of Transportation (Non-Medical): Not on file  Physical Activity:   . Days of Exercise per Week: Not on file  . Minutes of Exercise per Session: Not on file  Stress:   . Feeling of Stress : Not on file  Social Connections:   . Frequency of Communication with Friends and Family: Not on file  . Frequency of Social Gatherings with Friends and Family: Not on file  . Attends Religious Services: Not on file  . Active Member of Clubs or Organizations: Not on file  . Attends Banker Meetings:  Not on file  . Marital Status: Not on file  Intimate Partner Violence:   . Fear of Current or Ex-Partner: Not on file  . Emotionally Abused: Not on file  . Physically Abused: Not on file  . Sexually Abused: Not on file   Family History  Problem Relation Age of Onset  . Diabetes Mother   . Heart disease Brother   . Hypertension Brother   . Diabetes Brother   . Hypertension Daughter   . Diabetes Brother   . Diabetes Brother   . Alzheimer's disease Brother   . Anxiety disorder Daughter       VITAL SIGNS BP 126/74   Pulse (!) 52   Temp (!) 97.3 F (36.3 C) (Oral)   Resp 20   Ht 5' (1.524 m)   Wt 154 lb 9.6 oz (70.1 kg)   BMI 30.19 kg/m   No facility-administered encounter medications on file as of 12/24/2019.   Outpatient Encounter Medications as of 12/24/2019  Medication Sig  . acetaminophen (TYLENOL) 650 MG CR tablet Take 650 mg by mouth every 8 (eight) hours as needed for pain.  Marland Kitchen amLODipine (NORVASC) 10 MG tablet Take 1 tablet (10 mg total) by mouth daily.  Marland Kitchen aspirin 81 MG chewable tablet Chew 81 mg by mouth daily.  . cephALEXin (KEFLEX) 500 MG capsule Take 1 capsule (500 mg total) by mouth 2 (two) times daily.  . DULoxetine (CYMBALTA) 60 MG capsule Take 60 mg by mouth daily.   . Emollient (EUCERIN) lotion Apply topically 4 (four) times daily as needed for dry skin.  Marland Kitchen lamoTRIgine (LAMICTAL) 25 MG tablet Take 50 mg by mouth 2 (two) times daily.   Marland Kitchen linagliptin (TRADJENTA) 5 MG TABS tablet Take 5 mg by mouth daily.  Marland Kitchen loratadine (CLARITIN) 10 MG tablet Take 10 mg by mouth daily.  . Melatonin 5 MG TABS Take 5 mg by mouth at bedtime.  . Menthol, Topical Analgesic, (BIOFREEZE ROLL-ON COLORLESS) 4 % GEL Apply 1 application topically daily as needed (FOR PAIN).   Marland Kitchen metFORMIN (GLUCOPHAGE) 1000 MG tablet Take 1,000 mg by mouth 2 (two) times daily with a meal.   . montelukast (SINGULAIR) 10 MG tablet Take 1 tablet (10 mg total) by mouth at bedtime.  . NON FORMULARY Diet:    Consistent Carbohydrate,  . nystatin (NYSTATIN) powder Apply 1 application topically 3 (three) times daily.  . pantoprazole (PROTONIX) 40 MG tablet Take 40 mg by mouth every morning.  . polyethylene glycol (MIRALAX / GLYCOLAX) packet Take 17 g by mouth daily.  Marland Kitchen senna-docusate (SENOKOT-S) 8.6-50 MG tablet Take 1 tablet by mouth at bedtime.   . simvastatin (ZOCOR) 10 MG tablet Take 10 mg by mouth at bedtime.   . Sodium Fluoride (  PREVIDENT 5000 BOOSTER) 1.1 % PSTE Place 1 application onto teeth at bedtime.   . traMADol (ULTRAM) 50 MG tablet Take 50 mg by mouth 2 (two) times daily.   Marland Kitchen triamcinolone (NASACORT ALLERGY 24HR) 55 MCG/ACT AERO nasal inhaler Place 1 spray into the nose daily.  . valACYclovir (VALTREX) 500 MG tablet Take 500 mg by mouth daily.      SIGNIFICANT DIAGNOSTIC EXAMS  TODAY  12-18-19: chest x-ray: Mild right upper lobe and bibasilar airspace opacities which could reflect early pneumonia (including atypical/viral infection).  12-17-18; ct of abdomen and pelvis: Slight edema of the mucosa of the distal antrum of the stomach. This could represent gastritis. Otherwise, benign-appearing abdomen and pelvis.  LABS REVIEWED TODAY;   12-16-19: urine culture: citrobacter youngae 12-18-19: wbc 16.6; hgb 14.7; hct 45.8; mcv 87.1 plt 330; glucose 168; bun 23; creat 0.85; k+ 3.7; na++ 136; ca 9.7; liver normal albumin 4.4 12-20-19: wbc 12.9; hgb 14.8; hct 48.2 ;mcv 90.4 plt 296 glucose 152; bun 33; creat 1.03 ;k+ 3.8; na++ 139; ca 9.7    Review of Systems  Constitutional: Negative for malaise/fatigue.  Respiratory: Negative for cough and shortness of breath.   Cardiovascular: Negative for chest pain, palpitations and leg swelling.  Gastrointestinal: Negative for abdominal pain, constipation and heartburn.  Musculoskeletal: Negative for back pain, joint pain and myalgias.  Skin: Negative.   Neurological: Negative for dizziness.  Psychiatric/Behavioral: The patient is not  nervous/anxious.     Physical Exam Constitutional:      General: She is not in acute distress.    Appearance: She is well-developed. She is not diaphoretic.  Neck:     Thyroid: No thyromegaly.  Cardiovascular:     Rate and Rhythm: Normal rate and regular rhythm.     Pulses: Normal pulses.     Heart sounds: Normal heart sounds.  Pulmonary:     Effort: Pulmonary effort is normal. No respiratory distress.     Breath sounds: Normal breath sounds.  Abdominal:     General: Bowel sounds are normal. There is no distension.     Palpations: Abdomen is soft.     Tenderness: There is no abdominal tenderness.  Musculoskeletal:        General: Normal range of motion.     Cervical back: Neck supple.     Right lower leg: No edema.     Left lower leg: No edema.  Lymphadenopathy:     Cervical: No cervical adenopathy.  Skin:    General: Skin is warm and dry.  Neurological:     Mental Status: She is alert. Mental status is at baseline.  Psychiatric:        Mood and Affect: Mood normal.       ASSESSMENT/ PLAN:  1. Acute UTI: is stable will complete keflex and will monitor her status.   2. Hypertension associated with type 2 diabetes mellitus: is stable b/p 126/74 will continue norvasc 10 mg daily and asa 81 mg daily   3. Dyslipidemia associated with type 2 diabetes mellitus: is stable will continue zocor 10 mg daily   4. Non-insulin dependent type 2 diabetes mellitus: is stable will continue metformin 1 gm twice daily tradjenta 5 mg daily   5. Gastroesophageal reflux without esophagitis: is stable will continue protonic 40 mg daily   6. Chronic non-seasonal allergic rhinitis: is stable will continue nasocort daily claritin 10 mg daily singulair 10 mg daily   7. Herpes: no recent outbreak: will continue valtrex 500 mg daily  8. Chronic constipation: is stable will continue miralax daily and senna s daily   9. Major depression recurrent chronic: is stable will continue cymbalta 60  mg daily lamictal 50 mg twice daily for mood and melatnonin 5 mg nightly will monitor    MD is aware of resident's narcotic use and is in agreement with current plan of care. We will attempt to wean resident as appropriate.  Ok Edwards NP [facility]  Contact [phone] Monday through Friday 8am- 5pm  After hours call [phone]

## 2019-12-25 ENCOUNTER — Other Ambulatory Visit: Payer: Self-pay | Admitting: Adult Health

## 2019-12-25 ENCOUNTER — Encounter: Payer: Self-pay | Admitting: Internal Medicine

## 2019-12-25 ENCOUNTER — Non-Acute Institutional Stay (SKILLED_NURSING_FACILITY): Payer: Medicare Other | Admitting: Internal Medicine

## 2019-12-25 DIAGNOSIS — E119 Type 2 diabetes mellitus without complications: Secondary | ICD-10-CM

## 2019-12-25 DIAGNOSIS — B009 Herpesviral infection, unspecified: Secondary | ICD-10-CM

## 2019-12-25 DIAGNOSIS — E785 Hyperlipidemia, unspecified: Secondary | ICD-10-CM

## 2019-12-25 DIAGNOSIS — I10 Essential (primary) hypertension: Secondary | ICD-10-CM

## 2019-12-25 DIAGNOSIS — E1159 Type 2 diabetes mellitus with other circulatory complications: Secondary | ICD-10-CM

## 2019-12-25 DIAGNOSIS — E1169 Type 2 diabetes mellitus with other specified complication: Secondary | ICD-10-CM | POA: Diagnosis not present

## 2019-12-25 DIAGNOSIS — F339 Major depressive disorder, recurrent, unspecified: Secondary | ICD-10-CM

## 2019-12-25 DIAGNOSIS — N390 Urinary tract infection, site not specified: Secondary | ICD-10-CM | POA: Diagnosis not present

## 2019-12-25 DIAGNOSIS — K219 Gastro-esophageal reflux disease without esophagitis: Secondary | ICD-10-CM

## 2019-12-25 MED ORDER — TRAMADOL HCL 50 MG PO TABS: 50.0000 mg | ORAL_TABLET | Freq: Two times a day (BID) | ORAL | 0 refills | Status: DC

## 2019-12-25 NOTE — Progress Notes (Signed)
: Provider:  Margit Hanks., MD Location:  [facility] Room Number: [id number] Place of Service:  SNF ([phone])  PCP: Avon Gully, MD Patient Care Team: Avon Gully, MD as PCP - General (Internal Medicine)  Extended Emergency Contact Information Primary Emergency Contact: Atkins,Patty Address: [address]          [place], [place] [address] [place] of [place] Home Phone: [phone] Relation: Daughter Secondary Emergency Contact: GRIFFIN,TOMMY Mobile Phone: [phone] Relation: Son     Allergies: Lisinopril, Penicillins, and Sulfa antibiotics  Chief Complaint  Patient presents with  . New Admit To SNF    New admission to [facility]    HPI: Patient is a 79 y.o. female with diabetes mellitus type 2, depression, anxiety, GERD, hypertension, cord compression, who was dropped off in the emergency department per EMS per family who told EMS that they no longer take care of her.  Patient has been living in an assisted care facility.  Patient is currently being treated for UTI with Macrobid, her appetite is in the poor assisted living and she has been confused and will become anxious at times.  Patient is admitted to skilled nursing facility from the ED for OT/PT.  While at skilled nursing facility patient will be followed for hypertension treated with Norvasc, diabetes treated with Metformin and Tradjenta and hyperlipidemia treated with Zocor.  Past Medical History:  Diagnosis Date  . Anxiety   . Bowel obstruction (HCC)   . Burning with urination 01/04/2016  . Diabetes mellitus without complication (HCC)   . Genital herpes   . GERD (gastroesophageal reflux disease)   . Hematuria 01/04/2016  . Herpes 12/22/2015  . Hyperlipidemia   . Hypertension   . LLQ abdominal tenderness 01/04/2016  . Neuropathy   . Pelvic pressure in female 01/04/2016  . Recurrent UTI   . Sciatic nerve disease   . Small bowel obstruction (HCC) 04/27/2016  . Urinary  frequency 01/04/2016  . Urticaria     Past Surgical History:  Procedure Laterality Date  . ABDOMINAL HYSTERECTOMY    . APPLICATION OF INTRAOPERATIVE CT SCAN N/A 12/19/2018   Procedure: APPLICATION OF INTRAOPERATIVE CT SCAN;  Surgeon: Maeola Harman, MD;  Location: [facility] OR;  Service: Neurosurgery;  Laterality: N/A;  . bowel obstruction    . COLON SURGERY     blocked colon  . POSTERIOR CERVICAL FUSION/FORAMINOTOMY N/A 12/19/2018   Procedure: Cervical Seven to Thoracic One Posterior cervical fusion;  Surgeon: Maeola Harman, MD;  Location: [facility] OR;  Service: Neurosurgery;  Laterality: N/A;  Cervical Seven to Thoracic One Posterior cervical fusion    Allergies as of 12/25/2019      Reactions   Lisinopril    Penicillins Nausea Only   Has patient had a PCN reaction causing immediate rash, facial/tongue/throat swelling, SOB or lightheadedness with hypotension: Yes Has patient had a PCN reaction causing severe rash involving mucus membranes or skin necrosis: No Has patient had a PCN reaction that required hospitalization No Has patient had a PCN reaction occurring within the last 10 years: No If all of the above answers are "NO", then may proceed with Cephalosporin use.   Sulfa Antibiotics Nausea And Vomiting      Medication List    Notice   This visit is during an admission. Changes to the med list made in this visit will be reflected in the After Visit Summary of the admission.    No current facility-administered medications on file prior to visit.  Current Outpatient Medications on File Prior to Visit  Medication Sig Dispense Refill  . acetaminophen (TYLENOL) 650 MG CR tablet Take 650 mg by mouth every 8 (eight) hours as needed for pain.    Marland Kitchen amLODipine (NORVASC) 10 MG tablet Take 1 tablet (10 mg total) by mouth daily. 30 tablet 0  . aspirin 81 MG chewable tablet Chew 81 mg by mouth daily.    . cephALEXin (KEFLEX) 500 MG capsule Take 1 capsule (500 mg total) by mouth 2 (two) times daily. 10  capsule 0  . DULoxetine (CYMBALTA) 60 MG capsule Take 60 mg by mouth daily.     . Emollient (EUCERIN) lotion Apply topically 4 (four) times daily as needed for dry skin.    . feeding supplement, GLUCERNA SHAKE, (GLUCERNA SHAKE) LIQD Take 237 mLs by mouth 2 (two) times daily between meals.    . lamoTRIgine (LAMICTAL) 25 MG tablet Take 50 mg by mouth 2 (two) times daily.     Marland Kitchen linagliptin (TRADJENTA) 5 MG TABS tablet Take 5 mg by mouth daily.    Marland Kitchen loratadine (CLARITIN) 10 MG tablet Take 10 mg by mouth daily.    . Melatonin 5 MG TABS Take 5 mg by mouth at bedtime.    . Menthol, Topical Analgesic, (BIOFREEZE ROLL-ON COLORLESS) 4 % GEL Apply 1 application topically daily as needed (FOR PAIN).     Marland Kitchen metFORMIN (GLUCOPHAGE) 1000 MG tablet Take 1,000 mg by mouth 2 (two) times daily with a meal.     . montelukast (SINGULAIR) 10 MG tablet Take 1 tablet (10 mg total) by mouth at bedtime. 30 tablet 5  . NON FORMULARY Diet:  Consistent Carbohydrate,    . nystatin (NYSTATIN) powder Apply 1 application topically 3 (three) times daily.    . pantoprazole (PROTONIX) 40 MG tablet Take 40 mg by mouth every morning.    . polyethylene glycol (MIRALAX / GLYCOLAX) packet Take 17 g by mouth daily.    Marland Kitchen senna-docusate (SENOKOT-S) 8.6-50 MG tablet Take 1 tablet by mouth at bedtime.     . simvastatin (ZOCOR) 10 MG tablet Take 10 mg by mouth at bedtime.     . Sodium Fluoride (PREVIDENT 5000 BOOSTER) 1.1 % PSTE Place 1 application onto teeth at bedtime.     . traMADol (ULTRAM) 50 MG tablet Take 1 tablet (50 mg total) by mouth 2 (two) times daily. 60 tablet 0  . triamcinolone (NASACORT ALLERGY 24HR) 55 MCG/ACT AERO nasal inhaler Place 1 spray into the nose daily.    . valACYclovir (VALTREX) 500 MG tablet Take 500 mg by mouth daily.     . [DISCONTINUED] gabapentin (NEURONTIN) 300 MG capsule Take 300 mg by mouth 2 (two) times daily. Reported on 02/03/2016       No orders of the defined types were placed in this  encounter.    There is no immunization history on file for this patient.  Social History   Tobacco Use  . Smoking status: Never Smoker  . Smokeless tobacco: Current User    Types: Snuff  Substance Use Topics  . Alcohol use: No    Family history is   Family History  Problem Relation Age of Onset  . Diabetes Mother   . Heart disease Brother   . Hypertension Brother   . Diabetes Brother   . Hypertension Daughter   . Diabetes Brother   . Diabetes Brother   . Alzheimer's disease Brother   . Anxiety disorder Daughter  Review of Systems  GENERAL:  no fevers, fatigue, appetite changes SKIN: No itching, or rash EYES: No eye pain, redness, discharge EARS: No earache, tinnitus, change in hearing NOSE: No congestion, drainage or bleeding  MOUTH/THROAT: No mouth or tooth pain, No sore throat RESPIRATORY: No cough, wheezing, SOB CARDIAC: No chest pain, palpitations, lower extremity edema  GI: No abdominal pain, No N/V/D or constipation, No heartburn or reflux  GU: No dysuria, frequency or urgency, or incontinence  MUSCULOSKELETAL: No unrelieved bone/joint pain NEUROLOGIC: No headache, dizziness or focal weakness PSYCHIATRIC: No c/o anxiety or sadness   Vitals:   12/25/19 1556  BP: 125/63  Pulse: 63  Resp: 20  Temp: (!) 97.4 F (36.3 C)    SpO2 Readings from Last 1 Encounters:  12/23/19 98%   Body mass index is 30.19 kg/m.     Physical Exam  GENERAL APPEARANCE: Alert, No acute distress.  SKIN: No diaphoresis rash HEAD: Normocephalic, atraumatic  EYES: Conjunctiva/lids clear. Pupils round, reactive. EOMs intact.  EARS: External exam WNL, canals clear. Hearing grossly normal.  NOSE: No deformity or discharge.  MOUTH/THROAT: Lips w/o lesions  RESPIRATORY: Breathing is even, unlabored. Lung sounds are clear   CARDIOVASCULAR: Heart RRR no murmurs, rubs or gallops. No peripheral edema.   GASTROINTESTINAL: Abdomen is soft, non-tender, not distended w/ normal  bowel sounds. GENITOURINARY: Bladder non tender, not distended  MUSCULOSKELETAL: No abnormal joints or musculature NEUROLOGIC:  Cranial nerves 2-12 grossly intact. Moves all extremities  PSYCHIATRIC: Mood and affect with dementia, oriented to self okay-year-old yesterday present for 140 normal 20 is a really good discharge okay consult will see see was destiny the etiology, no behavioral issues  Patient Active Problem List   Diagnosis Date Noted  . Dyslipidemia associated with type 2 diabetes mellitus (HCC) 12/24/2019  . Chronic non-seasonal allergic rhinitis 12/24/2019  . Acute UTI 12/24/2019  . Chronic constipation 12/24/2019  . Major depression, recurrent, chronic (HCC) 12/24/2019  . Cord compression (HCC) 12/15/2018  . Non-insulin dependent type 2 diabetes mellitus (HCC) 04/27/2016  . Depression 04/27/2016  . Anxiety state 04/27/2016  . GERD (gastroesophageal reflux disease) 04/27/2016  . Generalized weakness 02/03/2016  . Hypertension associated with type 2 diabetes mellitus (HCC) 02/03/2016  . Urinary frequency 01/04/2016  . Hematuria 01/04/2016  . Herpes 12/22/2015      Labs reviewed: Basic Metabolic Panel:    Component Value Date/Time   NA 139 12/20/2019 1858   K 3.8 12/20/2019 1858   CL 100 12/20/2019 1858   CO2 25 12/20/2019 1858   GLUCOSE 152 (H) 12/20/2019 1858   BUN 33 (H) 12/20/2019 1858   CREATININE 1.03 (H) 12/20/2019 1858   CALCIUM 9.7 12/20/2019 1858   PROT 7.9 12/18/2019 1047   ALBUMIN 4.4 12/18/2019 1047   AST 19 12/18/2019 1047   ALT 18 12/18/2019 1047   ALKPHOS 90 12/18/2019 1047   BILITOT 0.8 12/18/2019 1047   GFRNONAA 52 (L) 12/20/2019 1858   GFRAA >60 12/20/2019 1858    Recent Labs    12/16/19 1346 12/18/19 1047 12/20/19 1858  NA 135 136 139  K 3.5 3.7 3.8  CL 94* 95* 100  CO2 25 24 25   GLUCOSE 127* 168* 152*  BUN 10 23 33*  CREATININE 0.75 0.85 1.03*  CALCIUM 9.7 9.7 9.7   Liver Function Tests: Recent Labs    03/27/19 0446  06/18/19 1341 12/18/19 1047  AST 15 19 19   ALT 13 18 18   ALKPHOS 89 81 90  BILITOT 0.5 0.3  0.8  PROT 6.9 7.8 7.9  ALBUMIN 3.8 4.6 4.4   Recent Labs    06/18/19 1341 12/18/19 1047  LIPASE 50 29   No results for input(s): AMMONIA in the last 8760 hours. CBC: Recent Labs    03/27/19 0446 12/16/19 1346 12/18/19 1047 12/20/19 1858  WBC 10.2 13.2* 16.6* 12.9*  NEUTROABS 7.4 9.4* 12.8*  --   HGB 12.3 14.4 14.7 14.8  HCT 40.8 46.2* 45.8 48.2*  MCV 84.8 88.0 87.1 90.4  PLT 169 309 330 296   Lipid No results for input(s): CHOL, HDL, LDLCALC, TRIG in the last 8760 hours.  Cardiac Enzymes: No results for input(s): CKTOTAL, CKMB, CKMBINDEX, TROPONINI in the last 8760 hours. BNP: No results for input(s): BNP in the last 8760 hours. No results found for: [facility] Lab Results  Component Value Date   HGBA1C 7.5 (H) 12/14/2018   Lab Results  Component Value Date   TSH 3.920 10/09/2018   Lab Results  Component Value Date   [id number] 948 12/15/2018   No results found for: FOLATE No results found for: IRON, TIBC, FERRITIN  Imaging and Procedures obtained prior to SNF admission: No results found.   Not all labs, radiology exams or other studies done during hospitalization come through on my EPIC note; however they are reviewed by me.    Assessment and Plan  Acute UTI SNF-continue Keflex and complete course  Hypertension SNF-stable; continue Norvasc 10 mg daily  Diabetes mellitus type 2 SNF-continue Metformin 1000 mg twice daily, and Tradjenta 5 mg daily; patient is on ASA 81 mg daily, patient is on statin  Hyperlipidemia associated with type 2 diabetes SNF-not stated as uncontrolled; continue Zocor 10 mg daily  Major depression, recurrent chronic SNF-stable; continue Cymbalta 60 mg daily and Lamictal 50 mg twice daily for mood  GERD without esophagitis This SNF-continue Protonix 40 mg daily  Herpes SNF-no outbreak recent; continue prophylaxis with  Valtrex 500 mg daily  Nonseasonal allergic rhinitis SNF-appears controlled continue Nasacort daily and Claritin 10 mg daily, Singulair 10 mg daily    Time spent greater than 45 minutes;> 50% of time with patient was spent reviewing records, labs, tests and studies, counseling and developing plan of care  Noah Delaine Trino Higinbotham,MD

## 2019-12-26 ENCOUNTER — Encounter: Payer: Self-pay | Admitting: Adult Health

## 2019-12-26 ENCOUNTER — Non-Acute Institutional Stay (SKILLED_NURSING_FACILITY): Payer: Medicare Other | Admitting: Adult Health

## 2019-12-26 DIAGNOSIS — R531 Weakness: Secondary | ICD-10-CM

## 2019-12-26 DIAGNOSIS — E1159 Type 2 diabetes mellitus with other circulatory complications: Secondary | ICD-10-CM

## 2019-12-26 DIAGNOSIS — I10 Essential (primary) hypertension: Secondary | ICD-10-CM | POA: Diagnosis not present

## 2019-12-26 DIAGNOSIS — F339 Major depressive disorder, recurrent, unspecified: Secondary | ICD-10-CM | POA: Diagnosis not present

## 2019-12-26 NOTE — Progress Notes (Signed)
Location:    [facility] Room Number: [phone] Place of Service:  SNF (31) Carleene Overlie NP    CODE STATUS: FULL CODE  Allergies  Allergen Reactions  . Lisinopril   . Penicillins Nausea Only    Has patient had a PCN reaction causing immediate rash, facial/tongue/throat swelling, SOB or lightheadedness with hypotension: Yes Has patient had a PCN reaction causing severe rash involving mucus membranes or skin necrosis: No Has patient had a PCN reaction that required hospitalization No Has patient had a PCN reaction occurring within the last 10 years: No If all of the above answers are "NO", then may proceed with Cephalosporin use.   . Sulfa Antibiotics Nausea And Vomiting    Chief Complaint  Patient presents with  . Acute Visit    72 hr Care Plan Meeting    HPI:  We have come together for her 72 hour care plan meeting. She had been living in assisted living prior to her hospitalization. Her goal is to return back to that level of care. She will not need dme. She continues to participate in therapy. There are no reports of uncontrolled pain; no anxiety; no changes in appetite.   Past Medical History:  Diagnosis Date  . Anxiety   . Bowel obstruction (HCC)   . Burning with urination 01/04/2016  . Diabetes mellitus without complication (HCC)   . Genital herpes   . GERD (gastroesophageal reflux disease)   . Hematuria 01/04/2016  . Herpes 12/22/2015  . Hyperlipidemia   . Hypertension   . LLQ abdominal tenderness 01/04/2016  . Neuropathy   . Pelvic pressure in female 01/04/2016  . Recurrent UTI   . Sciatic nerve disease   . Small bowel obstruction (HCC) 04/27/2016  . Urinary frequency 01/04/2016  . Urticaria     Past Surgical History:  Procedure Laterality Date  . ABDOMINAL HYSTERECTOMY    . APPLICATION OF INTRAOPERATIVE CT SCAN N/A 12/19/2018   Procedure: APPLICATION OF INTRAOPERATIVE CT SCAN;  Surgeon: Maeola Harman, MD;  Location: [facility] OR;  Service:  Neurosurgery;  Laterality: N/A;  . bowel obstruction    . COLON SURGERY     blocked colon  . POSTERIOR CERVICAL FUSION/FORAMINOTOMY N/A 12/19/2018   Procedure: Cervical Seven to Thoracic One Posterior cervical fusion;  Surgeon: Maeola Harman, MD;  Location: [facility] OR;  Service: Neurosurgery;  Laterality: N/A;  Cervical Seven to Thoracic One Posterior cervical fusion    Social History   Socioeconomic History  . Marital status: Single    Spouse name: Not on file  . Number of children: Not on file  . Years of education: Not on file  . Highest education level: Not on file  Occupational History  . Not on file  Tobacco Use  . Smoking status: Never Smoker  . Smokeless tobacco: Current User    Types: Snuff  Substance and Sexual Activity  . Alcohol use: No  . Drug use: No  . Sexual activity: Not Currently    Birth control/protection: Surgical    Comment: hyst  Other Topics Concern  . Not on file  Social History Narrative  . Not on file   Social Determinants of Health   Financial Resource Strain:   . Difficulty of Paying Living Expenses: Not on file  Food Insecurity:   . Worried About Programme researcher, broadcasting/film/video in the Last Year: Not on file  . Ran Out of Food in the Last Year: Not on file  Transportation Needs:   .  Lack of Transportation (Medical): Not on file  . Lack of Transportation (Non-Medical): Not on file  Physical Activity:   . Days of Exercise per Week: Not on file  . Minutes of Exercise per Session: Not on file  Stress:   . Feeling of Stress : Not on file  Social Connections:   . Frequency of Communication with Friends and Family: Not on file  . Frequency of Social Gatherings with Friends and Family: Not on file  . Attends Religious Services: Not on file  . Active Member of Clubs or Organizations: Not on file  . Attends Banker Meetings: Not on file  . Marital Status: Not on file  Intimate Partner Violence:   . Fear of Current or Ex-Partner: Not on file  .  Emotionally Abused: Not on file  . Physically Abused: Not on file  . Sexually Abused: Not on file   Family History  Problem Relation Age of Onset  . Diabetes Mother   . Heart disease Brother   . Hypertension Brother   . Diabetes Brother   . Hypertension Daughter   . Diabetes Brother   . Diabetes Brother   . Alzheimer's disease Brother   . Anxiety disorder Daughter       VITAL SIGNS BP 131/79   Pulse 63   Temp (!) 97.4 F (36.3 C) (Oral)   Resp 20   Ht 5' (1.524 m)   Wt 154 lb 9.6 oz (70.1 kg)   BMI 30.19 kg/m   No facility-administered encounter medications on file as of 12/26/2019.   Outpatient Encounter Medications as of 12/26/2019  Medication Sig  . acetaminophen (TYLENOL) 650 MG CR tablet Take 650 mg by mouth every 8 (eight) hours as needed for pain.  Marland Kitchen amLODipine (NORVASC) 10 MG tablet Take 1 tablet (10 mg total) by mouth daily.  Marland Kitchen aspirin 81 MG chewable tablet Chew 81 mg by mouth daily.  . cephALEXin (KEFLEX) 500 MG capsule Take 1 capsule (500 mg total) by mouth 2 (two) times daily.  . DULoxetine (CYMBALTA) 60 MG capsule Take 60 mg by mouth daily.   . Emollient (EUCERIN) lotion Apply topically 4 (four) times daily as needed for dry skin.  . feeding supplement, GLUCERNA SHAKE, (GLUCERNA SHAKE) LIQD Take 237 mLs by mouth 2 (two) times daily between meals.  . lamoTRIgine (LAMICTAL) 25 MG tablet Take 50 mg by mouth 2 (two) times daily.   Marland Kitchen linagliptin (TRADJENTA) 5 MG TABS tablet Take 5 mg by mouth daily.  Marland Kitchen loratadine (CLARITIN) 10 MG tablet Take 10 mg by mouth daily.  . Melatonin 5 MG TABS Take 5 mg by mouth at bedtime.  . Menthol, Topical Analgesic, (BIOFREEZE ROLL-ON COLORLESS) 4 % GEL Apply 1 application topically daily as needed (FOR PAIN).   Marland Kitchen metFORMIN (GLUCOPHAGE) 1000 MG tablet Take 1,000 mg by mouth 2 (two) times daily with a meal.   . montelukast (SINGULAIR) 10 MG tablet Take 1 tablet (10 mg total) by mouth at bedtime.  . NON FORMULARY Diet:  Consistent  Carbohydrate,  . nystatin (NYSTATIN) powder Apply 1 application topically 3 (three) times daily.  . pantoprazole (PROTONIX) 40 MG tablet Take 40 mg by mouth every morning.  . polyethylene glycol (MIRALAX / GLYCOLAX) packet Take 17 g by mouth daily.  Marland Kitchen senna-docusate (SENOKOT-S) 8.6-50 MG tablet Take 1 tablet by mouth at bedtime.   . simvastatin (ZOCOR) 10 MG tablet Take 10 mg by mouth at bedtime.   . Sodium Fluoride (PREVIDENT 5000  BOOSTER) 1.1 % PSTE Place 1 application onto teeth at bedtime.   . traMADol (ULTRAM) 50 MG tablet Take 1 tablet (50 mg total) by mouth 2 (two) times daily.  Marland Kitchen triamcinolone (NASACORT ALLERGY 24HR) 55 MCG/ACT AERO nasal inhaler Place 1 spray into the nose daily.  . valACYclovir (VALTREX) 500 MG tablet Take 500 mg by mouth daily.   . [DISCONTINUED] gabapentin (NEURONTIN) 300 MG capsule Take 300 mg by mouth 2 (two) times daily. Reported on 02/03/2016     SIGNIFICANT DIAGNOSTIC EXAMS  PREVIOUS  12-18-19: chest x-ray: Mild right upper lobe and bibasilar airspace opacities which could reflect early pneumonia (including atypical/viral infection).  12-17-18; ct of abdomen and pelvis: Slight edema of the mucosa of the distal antrum of the stomach. This could represent gastritis. Otherwise, benign-appearing abdomen and pelvis.  NO NEW EXAMS.   LABS REVIEWED PREVIOUS;   12-16-19: urine culture: citrobacter youngae 12-18-19: wbc 16.6; hgb 14.7; hct 45.8; mcv 87.1 plt 330; glucose 168; bun 23; creat 0.85; k+ 3.7; na++ 136; ca 9.7; liver normal albumin 4.4 12-20-19: wbc 12.9; hgb 14.8; hct 48.2 ;mcv 90.4 plt 296 glucose 152; bun 33; creat 1.03 ;k+ 3.8; na++ 139; ca 9.7   NO NEW LABS.   Review of Systems  Constitutional: Negative for malaise/fatigue.  Respiratory: Negative for cough and shortness of breath.   Cardiovascular: Negative for chest pain, palpitations and leg swelling.  Gastrointestinal: Negative for abdominal pain, constipation and heartburn.  Musculoskeletal:  Negative for back pain, joint pain and myalgias.  Skin: Negative.   Neurological: Negative for dizziness.  Psychiatric/Behavioral: The patient is not nervous/anxious.     Physical Exam Constitutional:      General: She is not in acute distress.    Appearance: She is well-developed. She is not diaphoretic.  Neck:     Thyroid: No thyromegaly.  Cardiovascular:     Rate and Rhythm: Normal rate and regular rhythm.     Pulses: Normal pulses.     Heart sounds: Normal heart sounds.  Pulmonary:     Effort: Pulmonary effort is normal. No respiratory distress.     Breath sounds: Normal breath sounds.  Abdominal:     General: Bowel sounds are normal. There is no distension.     Palpations: Abdomen is soft.     Tenderness: There is no abdominal tenderness.  Musculoskeletal:        General: Normal range of motion.     Cervical back: Neck supple.     Right lower leg: No edema.     Left lower leg: No edema.  Lymphadenopathy:     Cervical: No cervical adenopathy.  Skin:    General: Skin is warm and dry.  Neurological:     Mental Status: She is alert. Mental status is at baseline.  Psychiatric:        Mood and Affect: Mood normal.        ASSESSMENT/ PLAN:  TODAY  1. Hypertension associated with type 2 diabetes mellitus 2. Generalized weakness 3. Major depression recurrent chronic  Will continue therapy as directed Will continue current medications Her goal is to return back to assisted living.     MD is aware of resident's narcotic use and is in agreement with current plan of care. We will attempt to wean resident as appropriate.  Ok Edwards NP [facility]  Contact [phone] Monday through Friday 8am- 5pm  After hours call [phone]

## 2019-12-30 ENCOUNTER — Encounter: Payer: Self-pay | Admitting: Internal Medicine

## 2019-12-31 ENCOUNTER — Encounter: Payer: Self-pay | Admitting: Internal Medicine

## 2019-12-31 NOTE — Progress Notes (Signed)
This encounter was created in error - please disregard.

## 2020-01-01 ENCOUNTER — Encounter: Payer: Self-pay | Admitting: Adult Health

## 2020-01-01 ENCOUNTER — Non-Acute Institutional Stay (SKILLED_NURSING_FACILITY): Payer: Medicare Other | Admitting: Adult Health

## 2020-01-01 DIAGNOSIS — E1159 Type 2 diabetes mellitus with other circulatory complications: Secondary | ICD-10-CM | POA: Diagnosis not present

## 2020-01-01 DIAGNOSIS — E119 Type 2 diabetes mellitus without complications: Secondary | ICD-10-CM

## 2020-01-01 DIAGNOSIS — E1169 Type 2 diabetes mellitus with other specified complication: Secondary | ICD-10-CM | POA: Diagnosis not present

## 2020-01-01 DIAGNOSIS — I10 Essential (primary) hypertension: Secondary | ICD-10-CM

## 2020-01-01 DIAGNOSIS — I152 Hypertension secondary to endocrine disorders: Secondary | ICD-10-CM

## 2020-01-01 DIAGNOSIS — E785 Hyperlipidemia, unspecified: Secondary | ICD-10-CM

## 2020-01-01 NOTE — Progress Notes (Signed)
Location:  [facility] Room Number: [phone] Place of Service:  SNF (31) Carleene Overlie NP    CODE STATUS: FULL   Allergies  Allergen Reactions  . Lisinopril   . Penicillins Nausea Only    Has patient had a PCN reaction causing immediate rash, facial/tongue/throat swelling, SOB or lightheadedness with hypotension: Yes Has patient had a PCN reaction causing severe rash involving mucus membranes or skin necrosis: No Has patient had a PCN reaction that required hospitalization No Has patient had a PCN reaction occurring within the last 10 years: No If all of the above answers are "NO", then may proceed with Cephalosporin use.   . Sulfa Antibiotics Nausea And Vomiting    Chief Complaint  Patient presents with  . Medical Management of Chronic Issues        Hypertension associated with type 2 diabetes mellitus:   Dyslipidemia associated with type 2 diabetes mellitus:  Non-insulin dependent type 2 diabetes mellitus:   Weekly follow up for the first 30 days post hospitalization.     HPI:  She is a 79 year [name] short term rehab patient being seen for the management of her chronic illnesses; hypertension; dyslipidemia; diabetes. There are no reports of uncontrolled pain; no changes in appetite; no reports of insomnia or anxiety.   Past Medical History:  Diagnosis Date  . Anxiety   . Bowel obstruction (HCC)   . Burning with urination 01/04/2016  . Diabetes mellitus without complication (HCC)   . Genital herpes   . GERD (gastroesophageal reflux disease)   . Hematuria 01/04/2016  . Herpes 12/22/2015  . Hyperlipidemia   . Hypertension   . LLQ abdominal tenderness 01/04/2016  . Neuropathy   . Pelvic pressure in female 01/04/2016  . Recurrent UTI   . Sciatic nerve disease   . Small bowel obstruction (HCC) 04/27/2016  . Urinary frequency 01/04/2016  . Urticaria     Past Surgical History:  Procedure Laterality Date  . ABDOMINAL HYSTERECTOMY    . APPLICATION OF  INTRAOPERATIVE CT SCAN N/A 12/19/2018   Procedure: APPLICATION OF INTRAOPERATIVE CT SCAN;  Surgeon: Maeola Harman, MD;  Location: [facility] OR;  Service: Neurosurgery;  Laterality: N/A;  . bowel obstruction    . COLON SURGERY     blocked colon  . POSTERIOR CERVICAL FUSION/FORAMINOTOMY N/A 12/19/2018   Procedure: Cervical Seven to Thoracic One Posterior cervical fusion;  Surgeon: Maeola Harman, MD;  Location: [facility] OR;  Service: Neurosurgery;  Laterality: N/A;  Cervical Seven to Thoracic One Posterior cervical fusion    Social History   Socioeconomic History  . Marital status: Single    Spouse name: Not on file  . Number of children: Not on file  . Years of education: Not on file  . Highest education level: Not on file  Occupational History  . Not on file  Tobacco Use  . Smoking status: Never Smoker  . Smokeless tobacco: Current User    Types: Snuff  Substance and Sexual Activity  . Alcohol use: No  . Drug use: No  . Sexual activity: Not Currently    Birth control/protection: Surgical    Comment: hyst  Other Topics Concern  . Not on file  Social History Narrative  . Not on file   Social Determinants of Health   Financial Resource Strain:   . Difficulty of Paying Living Expenses: Not on file  Food Insecurity:   . Worried About Programme researcher, broadcasting/film/video in the Last Year: Not on  file  . Ran Out of Food in the Last Year: Not on file  Transportation Needs:   . Lack of Transportation (Medical): Not on file  . Lack of Transportation (Non-Medical): Not on file  Physical Activity:   . Days of Exercise per Week: Not on file  . Minutes of Exercise per Session: Not on file  Stress:   . Feeling of Stress : Not on file  Social Connections:   . Frequency of Communication with Friends and Family: Not on file  . Frequency of Social Gatherings with Friends and Family: Not on file  . Attends Religious Services: Not on file  . Active Member of Clubs or Organizations: Not on file  . Attends Tax inspector Meetings: Not on file  . Marital Status: Not on file  Intimate Partner Violence:   . Fear of Current or Ex-Partner: Not on file  . Emotionally Abused: Not on file  . Physically Abused: Not on file  . Sexually Abused: Not on file   Family History  Problem Relation Age of Onset  . Diabetes Mother   . Heart disease Brother   . Hypertension Brother   . Diabetes Brother   . Hypertension Daughter   . Diabetes Brother   . Diabetes Brother   . Alzheimer's disease Brother   . Anxiety disorder Daughter       VITAL SIGNS BP 121/66   Pulse 76   Temp (!) 97 F (36.1 C) (Oral)   Resp 20   Ht 5' (1.524 m)   Wt 154 lb 9.6 oz (70.1 kg)   SpO2 98%   BMI 30.19 kg/m   No facility-administered encounter medications on file as of 01/01/2020.   Outpatient Encounter Medications as of 01/01/2020  Medication Sig  . acetaminophen (TYLENOL) 650 MG CR tablet Take 650 mg by mouth every 8 (eight) hours as needed for pain.  Marland Kitchen amLODipine (NORVASC) 10 MG tablet Take 1 tablet (10 mg total) by mouth daily.  Marland Kitchen aspirin 81 MG chewable tablet Chew 81 mg by mouth daily.  . DULoxetine (CYMBALTA) [name] MG capsule Take [name] mg by mouth daily.   . Emollient (EUCERIN) lotion Apply topically 4 (four) times daily as needed for dry skin.  . feeding supplement, GLUCERNA SHAKE, (GLUCERNA SHAKE) LIQD Take 237 mLs by mouth 2 (two) times daily between meals.  . lamoTRIgine (LAMICTAL) 25 MG tablet Take 25 mg by mouth 2 (two) times daily.   Marland Kitchen linagliptin (TRADJENTA) 5 MG TABS tablet Take 5 mg by mouth daily.  Marland Kitchen loratadine (CLARITIN) 10 MG tablet Take 10 mg by mouth daily.  . Melatonin 5 MG TABS Take 5 mg by mouth at bedtime.  . Menthol, Topical Analgesic, (BIOFREEZE ROLL-ON COLORLESS) 4 % GEL Apply 1 application topically daily as needed (FOR PAIN).   Marland Kitchen metFORMIN (GLUCOPHAGE) 1000 MG tablet Take 1,000 mg by mouth 2 (two) times daily with a meal.   . montelukast (SINGULAIR) 10 MG tablet Take 1 tablet (10 mg total)  by mouth at bedtime.  . NON FORMULARY Diet:  Consistent Carbohydrate,  . nystatin (NYSTATIN) powder Apply 1 application topically 3 (three) times daily.  . pantoprazole (PROTONIX) 40 MG tablet Take 40 mg by mouth every morning.  . polyethylene glycol (MIRALAX / GLYCOLAX) packet Take 17 g by mouth daily.  Marland Kitchen senna-docusate (SENOKOT-S) 8.6-50 MG tablet Take 1 tablet by mouth at bedtime.   . simvastatin (ZOCOR) 10 MG tablet Take 10 mg by mouth at bedtime.   Marland Kitchen  Sodium Fluoride (PREVIDENT 5000 BOOSTER) 1.1 % PSTE Place 1 application onto teeth at bedtime.   . traMADol (ULTRAM) 50 MG tablet Take 1 tablet (50 mg total) by mouth 2 (two) times daily.  Marland Kitchen triamcinolone (NASACORT ALLERGY 24HR) 55 MCG/ACT AERO nasal inhaler Place 1 spray into the nose daily.  . valACYclovir (VALTREX) 500 MG tablet Take 500 mg by mouth daily.   . cephALEXin (KEFLEX) 500 MG capsule Take 1 capsule (500 mg total) by mouth 2 (two) times daily.  . [DISCONTINUED] gabapentin (NEURONTIN) 300 MG capsule Take 300 mg by mouth 2 (two) times daily. Reported on 02/03/2016     SIGNIFICANT DIAGNOSTIC EXAMS   PREVIOUS  12-18-19: chest x-ray: Mild right upper lobe and bibasilar airspace opacities which could reflect early pneumonia (including atypical/viral infection).  12-17-18; ct of abdomen and pelvis: Slight edema of the mucosa of the distal antrum of the stomach. This could represent gastritis. Otherwise, benign-appearing abdomen and pelvis.  NO NEW EXAMS.   LABS REVIEWED PREVIOUS;   12-16-19: urine culture: citrobacter youngae 12-18-19: wbc 16.6; hgb 14.7; hct 45.8; mcv 87.1 plt 330; glucose 168; bun 23; creat 0.85; k+ 3.7; na++ 136; ca 9.7; liver normal albumin 4.4 12-20-19: wbc 12.9; hgb 14.8; hct 48.2 ;mcv 90.4 plt 296 glucose 152; bun 33; creat 1.03 ;k+ 3.8; na++ 139; ca 9.7   NO NEW LABS.   Review of Systems  Constitutional: Negative for malaise/fatigue.  Respiratory: Negative for cough and shortness of breath.     Cardiovascular: Negative for chest pain, palpitations and leg swelling.  Gastrointestinal: Negative for abdominal pain, constipation and heartburn.  Musculoskeletal: Negative for back pain, joint pain and myalgias.  Skin: Negative.   Neurological: Negative for dizziness.  Psychiatric/Behavioral: The patient is not nervous/anxious.     Physical Exam Constitutional:      General: She is not in acute distress.    Appearance: She is well-developed. She is not diaphoretic.  Neck:     Thyroid: No thyromegaly.  Cardiovascular:     Rate and Rhythm: Normal rate and regular rhythm.     Pulses: Normal pulses.     Heart sounds: Normal heart sounds.  Pulmonary:     Effort: Pulmonary effort is normal. No respiratory distress.     Breath sounds: Normal breath sounds.  Abdominal:     General: Bowel sounds are normal. There is no distension.     Palpations: Abdomen is soft.     Tenderness: There is no abdominal tenderness.  Musculoskeletal:        General: Normal range of motion.     Cervical back: Neck supple.     Right lower leg: No edema.     Left lower leg: No edema.  Lymphadenopathy:     Cervical: No cervical adenopathy.  Skin:    General: Skin is warm and dry.  Neurological:     Mental Status: She is alert. Mental status is at baseline.  Psychiatric:        Mood and Affect: Mood normal.      ASSESSMENT/ PLAN:  TODAY  1. Hypertension associated with type 2 diabetes mellitus: is stable b/p 121/66 will continue norvasc 10 mg daily and asa 81 mg daily   2. Dyslipidemia associated with type 2 diabetes mellitus: is stable will continue zocor 10 mg daily   3. Non-insulin dependent type 2 diabetes mellitus: is stable will continue metformin 1 gm twice daily tradjenta 5 mg daily    PREVIOUS   4. Gastroesophageal reflux  without esophagitis: is stable will continue protonic 40 mg daily   5. Chronic non-seasonal allergic rhinitis: is stable will continue nasocort daily claritin 10  mg daily singulair 10 mg daily   6. Herpes: no recent outbreak: will continue valtrex 500 mg daily   7. Chronic constipation: is stable will continue miralax daily and senna s daily   8. Major depression recurrent chronic: is stable will continue cymbalta [name] mg daily lamictal 50 mg twice daily for mood and melatnonin 5 mg nightly will monitor  9. Acute UTI: is stable will complete keflex and will monitor her status.    MD is aware of resident's narcotic use and is in agreement with current plan of care. We will attempt to wean resident as appropriate.  Synthia Innocent NP [facility]  Contact [phone] Monday through Friday 8am- 5pm  After hours call [phone]

## 2020-01-02 ENCOUNTER — Other Ambulatory Visit (HOSPITAL_COMMUNITY)
Admission: RE | Admit: 2020-01-02 | Discharge: 2020-01-02 | Disposition: A | Discharge status: Home / Self Care | Payer: Medicare Other | Source: Skilled Nursing Facility | Attending: Adult Health | Admitting: Adult Health

## 2020-01-02 DIAGNOSIS — N390 Urinary tract infection, site not specified: Secondary | ICD-10-CM | POA: Insufficient documentation

## 2020-01-02 LAB — CBC WITH DIFFERENTIAL/PLATELET
Abs Immature Granulocytes: 0.04 10*3/uL (ref 0.00–0.07)
Basophils Absolute: 0.1 10*3/uL (ref 0.0–0.1)
Basophils Relative: 1 %
Eosinophils Absolute: 0.4 10*3/uL (ref 0.0–0.5)
Eosinophils Relative: 2 %
HCT: 40.8 % (ref 36.0–46.0)
Hemoglobin: 12.9 g/dL (ref 12.0–15.0)
Immature Granulocytes: 0 %
Lymphocytes Relative: 18 %
Lymphs Abs: 2.6 10*3/uL (ref 0.7–4.0)
MCH: 28.4 pg (ref 26.0–34.0)
MCHC: 31.6 g/dL (ref 30.0–36.0)
MCV: 89.9 fL (ref 80.0–100.0)
Monocytes Absolute: 1 10*3/uL (ref 0.1–1.0)
Monocytes Relative: 7 %
Neutro Abs: 10.4 10*3/uL — ABNORMAL HIGH (ref 1.7–7.7)
Neutrophils Relative %: 72 %
Platelets: 171 10*3/uL (ref 150–400)
RBC: 4.54 MIL/uL (ref 3.87–5.11)
RDW: 15.6 % — ABNORMAL HIGH (ref 11.5–15.5)
WBC: 14.5 10*3/uL — ABNORMAL HIGH (ref 4.0–10.5)
nRBC: 0 % (ref 0.0–0.2)

## 2020-01-02 LAB — BASIC METABOLIC PANEL
Anion gap: 13 (ref 5–15)
BUN: 10 mg/dL (ref 8–23)
CO2: 27 mmol/L (ref 22–32)
Calcium: 9.3 mg/dL (ref 8.9–10.3)
Chloride: 98 mmol/L (ref 98–111)
Creatinine, Ser: 0.8 mg/dL (ref 0.44–1.00)
GFR calc Af Amer: >60 mL/min (ref >60)
GFR calc non Af Amer: >60 mL/min (ref >60)
Glucose, Bld: 128 mg/dL — ABNORMAL HIGH (ref 70–99)
Potassium: 4.3 mmol/L (ref 3.5–5.1)
Sodium: 138 mmol/L (ref 135–145)

## 2020-01-03 LAB — URINE CULTURE

## 2020-01-14 ENCOUNTER — Encounter: Payer: Self-pay | Admitting: Adult Health

## 2020-01-14 ENCOUNTER — Non-Acute Institutional Stay (SKILLED_NURSING_FACILITY): Payer: Medicare Other | Admitting: Adult Health

## 2020-01-14 DIAGNOSIS — B009 Herpesviral infection, unspecified: Secondary | ICD-10-CM

## 2020-01-14 DIAGNOSIS — J3089 Other allergic rhinitis: Secondary | ICD-10-CM

## 2020-01-14 DIAGNOSIS — K219 Gastro-esophageal reflux disease without esophagitis: Secondary | ICD-10-CM | POA: Diagnosis not present

## 2020-01-14 NOTE — Progress Notes (Signed)
Location:    [facility] Room Number: 132P Place of Service:  SNF (31) Carleene Overlie NP    CODE STATUS: FULL CODE  Allergies  Allergen Reactions  . Lisinopril   . Penicillins Nausea Only    Has patient had a PCN reaction causing immediate rash, facial/tongue/throat swelling, SOB or lightheadedness with hypotension: Yes Has patient had a PCN reaction causing severe rash involving mucus membranes or skin necrosis: No Has patient had a PCN reaction that required hospitalization No Has patient had a PCN reaction occurring within the last 10 years: No If all of the above answers are "NO", then may proceed with Cephalosporin use.   . Sulfa Antibiotics Nausea And Vomiting    Chief Complaint  Patient presents with  . Medical Management of Chronic Issues         Gastroesophageal reflux disease without esophagitis:  Chronic non-seasonal allergic rhinitis:   Herpes:  Weekly follow up for the first 30 days post hospitalization.     HPI:  She is a 79 year old short term rehab patient being seen for the management of her chronic illnesses: gerd; allergic rhinitis; herpes. There are no reports of uncontrolled pain; no changes in appetite; no reports of anxiety.   Past Medical History:  Diagnosis Date  . Anxiety   . Bowel obstruction (HCC)   . Burning with urination 01/04/2016  . Diabetes mellitus without complication (HCC)   . Genital herpes   . GERD (gastroesophageal reflux disease)   . Hematuria 01/04/2016  . Herpes 12/22/2015  . Hyperlipidemia   . Hypertension   . LLQ abdominal tenderness 01/04/2016  . Neuropathy   . Pelvic pressure in female 01/04/2016  . Recurrent UTI   . Sciatic nerve disease   . Small bowel obstruction (HCC) 04/27/2016  . Urinary frequency 01/04/2016  . Urticaria     Past Surgical History:  Procedure Laterality Date  . ABDOMINAL HYSTERECTOMY    . APPLICATION OF INTRAOPERATIVE CT SCAN N/A 12/19/2018   Procedure: APPLICATION OF  INTRAOPERATIVE CT SCAN;  Surgeon: Maeola Harman, MD;  Location: [facility] OR;  Service: Neurosurgery;  Laterality: N/A;  . bowel obstruction    . COLON SURGERY     blocked colon  . POSTERIOR CERVICAL FUSION/FORAMINOTOMY N/A 12/19/2018   Procedure: Cervical Seven to Thoracic One Posterior cervical fusion;  Surgeon: Maeola Harman, MD;  Location: [facility] OR;  Service: Neurosurgery;  Laterality: N/A;  Cervical Seven to Thoracic One Posterior cervical fusion    Social History   Socioeconomic History  . Marital status: Single    Spouse name: Not on file  . Number of children: Not on file  . Years of education: Not on file  . Highest education level: Not on file  Occupational History  . Not on file  Tobacco Use  . Smoking status: Never Smoker  . Smokeless tobacco: Current User    Types: Snuff  Substance and Sexual Activity  . Alcohol use: No  . Drug use: No  . Sexual activity: Not Currently    Birth control/protection: Surgical    Comment: hyst  Other Topics Concern  . Not on file  Social History Narrative  . Not on file   Social Determinants of Health   Financial Resource Strain:   . Difficulty of Paying Living Expenses: Not on file  Food Insecurity:   . Worried About Programme researcher, broadcasting/film/video in the Last Year: Not on file  . Ran Out of Food in the  Last Year: Not on file  Transportation Needs:   . Lack of Transportation (Medical): Not on file  . Lack of Transportation (Non-Medical): Not on file  Physical Activity:   . Days of Exercise per Week: Not on file  . Minutes of Exercise per Session: Not on file  Stress:   . Feeling of Stress : Not on file  Social Connections:   . Frequency of Communication with Friends and Family: Not on file  . Frequency of Social Gatherings with Friends and Family: Not on file  . Attends Religious Services: Not on file  . Active Member of Clubs or Organizations: Not on file  . Attends Banker Meetings: Not on file  . Marital Status: Not on file    Intimate Partner Violence:   . Fear of Current or Ex-Partner: Not on file  . Emotionally Abused: Not on file  . Physically Abused: Not on file  . Sexually Abused: Not on file   Family History  Problem Relation Age of Onset  . Diabetes Mother   . Heart disease Brother   . Hypertension Brother   . Diabetes Brother   . Hypertension Daughter   . Diabetes Brother   . Diabetes Brother   . Alzheimer's disease Brother   . Anxiety disorder Daughter       VITAL SIGNS BP 128/71   Pulse 74   Temp (!) 97 F (36.1 C) (Oral)   Resp 20   Ht 5' (1.524 m)   Wt 167 lb 12.8 oz (76.1 kg)   SpO2 98%   BMI 32.77 kg/m   No facility-administered encounter medications on file as of 01/14/2020.   Outpatient Encounter Medications as of 01/14/2020  Medication Sig  . acetaminophen (TYLENOL) 650 MG CR tablet Take 650 mg by mouth every 8 (eight) hours as needed for pain.  Marland Kitchen amLODipine (NORVASC) 10 MG tablet Take 1 tablet (10 mg total) by mouth daily.  Marland Kitchen aspirin 81 MG chewable tablet Chew 81 mg by mouth daily.  . DULoxetine (CYMBALTA) 60 MG capsule Take 60 mg by mouth daily.   . Emollient (EUCERIN) lotion Apply topically 4 (four) times daily as needed for dry skin.  . feeding supplement, GLUCERNA SHAKE, (GLUCERNA SHAKE) LIQD Take 237 mLs by mouth 2 (two) times daily between meals.  . lamoTRIgine (LAMICTAL) 25 MG tablet Take 25 mg by mouth 2 (two) times daily.   Marland Kitchen linagliptin (TRADJENTA) 5 MG TABS tablet Take 5 mg by mouth daily.  Marland Kitchen loratadine (CLARITIN) 10 MG tablet Take 10 mg by mouth daily.  . Melatonin 5 MG TABS Take 5 mg by mouth at bedtime.  . Menthol, Topical Analgesic, (BIOFREEZE ROLL-ON COLORLESS) 4 % GEL Apply 1 application topically daily as needed (FOR PAIN).   Marland Kitchen metFORMIN (GLUCOPHAGE) 1000 MG tablet Take 1,000 mg by mouth 2 (two) times daily with a meal.   . montelukast (SINGULAIR) 10 MG tablet Take 1 tablet (10 mg total) by mouth at bedtime.  . NON FORMULARY Diet:  Consistent  Carbohydrate,  . pantoprazole (PROTONIX) 40 MG tablet Take 40 mg by mouth every morning.  . polyethylene glycol (MIRALAX / GLYCOLAX) packet Take 17 g by mouth daily.  Marland Kitchen senna-docusate (SENOKOT-S) 8.6-50 MG tablet Take 1 tablet by mouth at bedtime.   . simvastatin (ZOCOR) 10 MG tablet Take 10 mg by mouth at bedtime.   . Sodium Fluoride (PREVIDENT 5000 BOOSTER) 1.1 % PSTE Place 1 application onto teeth at bedtime.   . traMADol Janean Sark)  50 MG tablet Take 1 tablet (50 mg total) by mouth 2 (two) times daily.  Marland Kitchen triamcinolone (NASACORT ALLERGY 24HR) 55 MCG/ACT AERO nasal inhaler Place 1 spray into the nose daily.  . valACYclovir (VALTREX) 500 MG tablet Take 500 mg by mouth daily.   . [DISCONTINUED] cephALEXin (KEFLEX) 500 MG capsule Take 1 capsule (500 mg total) by mouth 2 (two) times daily.  . [DISCONTINUED] gabapentin (NEURONTIN) 300 MG capsule Take 300 mg by mouth 2 (two) times daily. Reported on 02/03/2016  . [DISCONTINUED] nystatin (NYSTATIN) powder Apply 1 application topically 3 (three) times daily.     SIGNIFICANT DIAGNOSTIC EXAMS  PREVIOUS  12-18-19: chest x-ray: Mild right upper lobe and bibasilar airspace opacities which could reflect early pneumonia (including atypical/viral infection).  12-17-18; ct of abdomen and pelvis: Slight edema of the mucosa of the distal antrum of the stomach. This could represent gastritis. Otherwise, benign-appearing abdomen and pelvis.  NO NEW EXAMS.   LABS REVIEWED PREVIOUS;   12-16-19: urine culture: citrobacter youngae 12-18-19: wbc 16.6; hgb 14.7; hct 45.8; mcv 87.1 plt 330; glucose 168; bun 23; creat 0.85; k+ 3.7; na++ 136; ca 9.7; liver normal albumin 4.4 12-20-19: wbc 12.9; hgb 14.8; hct 48.2 ;mcv 90.4 plt 296 glucose 152; bun 33; creat 1.03 ;k+ 3.8; na++ 139; ca 9.7   TODAY  01-02-20: wbc 14.5; hgb 12.9; hct 40.8 mcv 89.9 plt 171; glucose 128; bun 10; creat 0.80 ;k+ 4.3; na++ 138; ca 9.3 urine culture: multiple species.    Review of Systems    Constitutional: Negative for malaise/fatigue.  Respiratory: Negative for cough and shortness of breath.   Cardiovascular: Negative for chest pain, palpitations and leg swelling.  Gastrointestinal: Negative for abdominal pain, constipation and heartburn.  Musculoskeletal: Negative for back pain, joint pain and myalgias.  Skin: Negative.   Neurological: Negative for dizziness.  Psychiatric/Behavioral: The patient is not nervous/anxious.     Physical Exam Constitutional:      General: She is not in acute distress.    Appearance: She is well-developed. She is not diaphoretic.  Neck:     Thyroid: No thyromegaly.  Cardiovascular:     Rate and Rhythm: Normal rate and regular rhythm.     Pulses: Normal pulses.     Heart sounds: Normal heart sounds.  Pulmonary:     Effort: Pulmonary effort is normal. No respiratory distress.     Breath sounds: Normal breath sounds.  Abdominal:     General: Bowel sounds are normal. There is no distension.     Palpations: Abdomen is soft.     Tenderness: There is no abdominal tenderness.  Musculoskeletal:        General: Normal range of motion.     Cervical back: Neck supple.     Right lower leg: No edema.     Left lower leg: No edema.  Lymphadenopathy:     Cervical: No cervical adenopathy.  Skin:    General: Skin is warm and dry.  Neurological:     Mental Status: She is alert. Mental status is at baseline.  Psychiatric:        Mood and Affect: Mood normal.       ASSESSMENT/ PLAN:  TODAY  1. Gastroesophageal reflux disease without esophagitis: is stable will continue protonix 40 mg daily   2. Chronic non-seasonal allergic rhinitis: is stable will continue nasocort daily claritin to mg daily and singulair 10 mg daily   3. Herpes: is stable no recent outbreak will continue valtrex 500 mg  daily    PREVIOUS   4. Chronic constipation: is stable will continue miralax daily and senna s daily   5. Major depression recurrent chronic: is  stable will continue cymbalta 60 mg daily lamictal 50 mg twice daily for mood and melatnonin 5 mg nightly will monitor  6. Hypertension associated with type 2 diabetes mellitus: is stable b/p 128/71 will continue norvasc 10 mg daily and asa 81 mg daily   7. Dyslipidemia associated with type 2 diabetes mellitus: is stable will continue zocor 10 mg daily   8. Non-insulin dependent type 2 diabetes mellitus: is stable will continue metformin 1 gm twice daily tradjenta 5 mg daily     MD is aware of resident's narcotic use and is in agreement with current plan of care. We will attempt to wean resident as appropriate.  Ok Edwards NP [facility]  Contact [phone] Monday through Friday 8am- 5pm  After hours call [phone]

## 2020-01-20 ENCOUNTER — Other Ambulatory Visit: Payer: Self-pay | Admitting: Adult Health

## 2020-01-20 MED ORDER — TRAMADOL HCL 50 MG PO TABS: 50.0000 mg | ORAL_TABLET | Freq: Two times a day (BID) | ORAL | 0 refills | Status: DC

## 2020-01-23 ENCOUNTER — Non-Acute Institutional Stay (SKILLED_NURSING_FACILITY): Payer: Medicare Other | Admitting: Adult Health

## 2020-01-23 ENCOUNTER — Encounter: Payer: Self-pay | Admitting: Adult Health

## 2020-01-23 DIAGNOSIS — I10 Essential (primary) hypertension: Secondary | ICD-10-CM

## 2020-01-23 DIAGNOSIS — E1159 Type 2 diabetes mellitus with other circulatory complications: Secondary | ICD-10-CM

## 2020-01-23 DIAGNOSIS — K5909 Other constipation: Secondary | ICD-10-CM

## 2020-01-23 DIAGNOSIS — F339 Major depressive disorder, recurrent, unspecified: Secondary | ICD-10-CM | POA: Diagnosis not present

## 2020-01-23 NOTE — Progress Notes (Signed)
Location:    [place] Room Number: 110W Place of Service:  SNF (31) Phillips Grout NP    CODE STATUS: FULL   Allergies  Allergen Reactions  . Lisinopril   . Penicillins Nausea Only    Has patient had a PCN reaction causing immediate rash, facial/tongue/throat swelling, SOB or lightheadedness with hypotension: Yes Has patient had a PCN reaction causing severe rash involving mucus membranes or skin necrosis: No Has patient had a PCN reaction that required hospitalization No Has patient had a PCN reaction occurring within the last 10 years: No If all of the above answers are "NO", then may proceed with Cephalosporin use.   . Sulfa Antibiotics Nausea And Vomiting    Chief Complaint  Patient presents with  . Medical Management of Chronic Issues        Chronic constipation: Major depression recurrent chronic: Hypertension associated with type 2 diabetes mellitus: weekly follow up for the first 30 days post hospitalization.     HPI:  She is a 79 year old short term rehab patient being seen for the management of her chronic illnesses: constipation; depression; hypertension. There are no reports of uncontrolled pain; no constipation; no depressive thoughts or anxiety.   Past Medical History:  Diagnosis Date  . Anxiety   . Bowel obstruction ([place])   . Burning with urination 01/04/2016  . Diabetes mellitus without complication ([place])   . Genital herpes   . GERD (gastroesophageal reflux disease)   . Hematuria 01/04/2016  . Herpes 12/22/2015  . Hyperlipidemia   . Hypertension   . LLQ abdominal tenderness 01/04/2016  . Neuropathy   . Pelvic pressure in female 01/04/2016  . Recurrent UTI   . Sciatic nerve disease   . Small bowel obstruction ([place]) 04/27/2016  . Urinary frequency 01/04/2016  . Urticaria     Past Surgical History:  Procedure Laterality Date  . ABDOMINAL HYSTERECTOMY    . APPLICATION OF INTRAOPERATIVE CT SCAN N/A 12/19/2018   Procedure:  APPLICATION OF INTRAOPERATIVE CT SCAN;  Surgeon: Erline Levine, MD;  Location: [place];  Service: Neurosurgery;  Laterality: N/A;  . bowel obstruction    . COLON SURGERY     blocked colon  . POSTERIOR CERVICAL FUSION/FORAMINOTOMY N/A 12/19/2018   Procedure: Cervical Seven to Thoracic One Posterior cervical fusion;  Surgeon: Erline Levine, MD;  Location: [place];  Service: Neurosurgery;  Laterality: N/A;  Cervical Seven to Thoracic One Posterior cervical fusion    Social History   Socioeconomic History  . Marital status: Single    Spouse name: Not on file  . Number of children: Not on file  . Years of education: Not on file  . Highest education level: Not on file  Occupational History  . Not on file  Tobacco Use  . Smoking status: Never Smoker  . Smokeless tobacco: Current User    Types: Snuff  Substance and Sexual Activity  . Alcohol use: No  . Drug use: No  . Sexual activity: Not Currently    Birth control/protection: Surgical    Comment: hyst  Other Topics Concern  . Not on file  Social History Narrative  . Not on file   Social Determinants of Health   Financial Resource Strain:   . Difficulty of Paying Living Expenses: Not on file  Food Insecurity:   . Worried About Charity fundraiser in the Last Year: Not on file  . Ran Out of Food in the Last Year: Not on  file  Transportation Needs:   . Freight forwarder (Medical): Not on file  . Lack of Transportation (Non-Medical): Not on file  Physical Activity:   . Days of Exercise per Week: Not on file  . Minutes of Exercise per Session: Not on file  Stress:   . Feeling of Stress : Not on file  Social Connections:   . Frequency of Communication with Friends and Family: Not on file  . Frequency of Social Gatherings with Friends and Family: Not on file  . Attends Religious Services: Not on file  . Active Member of Clubs or Organizations: Not on file  . Attends Banker Meetings: Not on file  . Marital Status:  Not on file  Intimate Partner Violence:   . Fear of Current or Ex-Partner: Not on file  . Emotionally Abused: Not on file  . Physically Abused: Not on file  . Sexually Abused: Not on file   Family History  Problem Relation Age of Onset  . Diabetes Mother   . Heart disease Brother   . Hypertension Brother   . Diabetes Brother   . Hypertension Daughter   . Diabetes Brother   . Diabetes Brother   . Alzheimer's disease Brother   . Anxiety disorder Daughter       VITAL SIGNS BP 129/76   Pulse 69   Temp (!) 97.5 F (36.4 C) (Oral)   Resp 20   Ht 5' (1.524 m)   Wt 168 lb (76.2 kg)   SpO2 98%   BMI 32.81 kg/m   No facility-administered encounter medications on file as of 01/23/2020.   Outpatient Encounter Medications as of 01/23/2020  Medication Sig  . acetaminophen (TYLENOL) 650 MG CR tablet Take 650 mg by mouth every 8 (eight) hours as needed for pain.  Marland Kitchen amLODipine (NORVASC) 10 MG tablet Take 1 tablet (10 mg total) by mouth daily.  Marland Kitchen aspirin 81 MG chewable tablet Chew 81 mg by mouth daily.  . DULoxetine (CYMBALTA) 60 MG capsule Take 60 mg by mouth daily.   . Emollient (EUCERIN) lotion Apply topically 4 (four) times daily as needed for dry skin.  Marland Kitchen lamoTRIgine (LAMICTAL) 25 MG tablet Take 25 mg by mouth 2 (two) times daily.   Marland Kitchen linagliptin (TRADJENTA) 5 MG TABS tablet Take 5 mg by mouth daily.  Marland Kitchen loratadine (CLARITIN) 10 MG tablet Take 10 mg by mouth daily.  . Melatonin 5 MG TABS Take 5 mg by mouth at bedtime.  . Menthol, Topical Analgesic, (BIOFREEZE ROLL-ON COLORLESS) 4 % GEL Apply 1 application topically daily as needed (FOR PAIN).   Marland Kitchen metFORMIN (GLUCOPHAGE) 1000 MG tablet Take 1,000 mg by mouth 2 (two) times daily with a meal.   . montelukast (SINGULAIR) 10 MG tablet Take 1 tablet (10 mg total) by mouth at bedtime.  . NON FORMULARY Diet:  Consistent Carbohydrate,  . pantoprazole (PROTONIX) 40 MG tablet Take 40 mg by mouth every morning.  . polyethylene glycol (MIRALAX  / GLYCOLAX) packet Take 17 g by mouth daily.  Marland Kitchen senna-docusate (SENOKOT-S) 8.6-50 MG tablet Take 1 tablet by mouth at bedtime.   . simvastatin (ZOCOR) 10 MG tablet Take 10 mg by mouth at bedtime.   . Sodium Fluoride (PREVIDENT 5000 BOOSTER) 1.1 % PSTE Place 1 application onto teeth at bedtime.   . traMADol (ULTRAM) 50 MG tablet Take 1 tablet (50 mg total) by mouth 2 (two) times daily.  Marland Kitchen triamcinolone (NASACORT ALLERGY 24HR) 55 MCG/ACT AERO nasal inhaler Place  1 spray into the nose daily.  . valACYclovir (VALTREX) 500 MG tablet Take 500 mg by mouth daily.   . [DISCONTINUED] feeding supplement, GLUCERNA SHAKE, (GLUCERNA SHAKE) LIQD Take 237 mLs by mouth 2 (two) times daily between meals.  . [DISCONTINUED] gabapentin (NEURONTIN) 300 MG capsule Take 300 mg by mouth 2 (two) times daily. Reported on 02/03/2016     SIGNIFICANT DIAGNOSTIC EXAMS   PREVIOUS  12-18-19: chest x-ray: Mild right upper lobe and bibasilar airspace opacities which could reflect early pneumonia (including atypical/viral infection).  12-17-18; ct of abdomen and pelvis: Slight edema of the mucosa of the distal antrum of the stomach. This could represent gastritis. Otherwise, benign-appearing abdomen and pelvis.  NO NEW EXAMS.   LABS REVIEWED PREVIOUS;   12-16-19: urine culture: citrobacter youngae 12-18-19: wbc 16.6; hgb 14.7; hct 45.8; mcv 87.1 plt 330; glucose 168; bun 23; creat 0.85; k+ 3.7; na++ 136; ca 9.7; liver normal albumin 4.4 12-20-19: wbc 12.9; hgb 14.8; hct 48.2 ;mcv 90.4 plt 296 glucose 152; bun 33; creat 1.03 ;k+ 3.8; na++ 139; ca 9.7  01-02-20: wbc 14.5; hgb 12.9; hct 40.8 mcv 89.9 plt 171; glucose 128; bun 10; creat 0.80 ;k+ 4.3; na++ 138; ca 9.3 urine culture: multiple species.   NO NEW LABS.    Review of Systems  Constitutional: Negative for malaise/fatigue.  Respiratory: Negative for cough and shortness of breath.   Cardiovascular: Negative for chest pain, palpitations and leg swelling.  Gastrointestinal:  Negative for abdominal pain, constipation and heartburn.  Musculoskeletal: Negative for back pain, joint pain and myalgias.  Skin: Negative.   Neurological: Negative for dizziness.  Psychiatric/Behavioral: The patient is not nervous/anxious.      Physical Exam Constitutional:      General: She is not in acute distress.    Appearance: She is well-developed. She is not diaphoretic.  Neck:     Thyroid: No thyromegaly.  Cardiovascular:     Rate and Rhythm: Normal rate and regular rhythm.     Pulses: Normal pulses.     Heart sounds: Normal heart sounds.  Pulmonary:     Effort: Pulmonary effort is normal. No respiratory distress.     Breath sounds: Normal breath sounds.  Abdominal:     General: Bowel sounds are normal. There is no distension.     Palpations: Abdomen is soft.     Tenderness: There is no abdominal tenderness.  Musculoskeletal:        General: Normal range of motion.     Cervical back: Neck supple.     Right lower leg: No edema.     Left lower leg: No edema.  Lymphadenopathy:     Cervical: No cervical adenopathy.  Skin:    General: Skin is warm and dry.  Neurological:     Mental Status: She is alert. Mental status is at baseline.  Psychiatric:        Mood and Affect: Mood normal.       ASSESSMENT/ PLAN:  TODAY  1. Chronic constipation: is stable will continue miralax and senna s daily   2. Major depression recurrent chronic: is stable will continue cymbalta 60 mg daily lamictal 50 mg twice daily for mood and melatonin 5 mg nightly   3. Hypertension associated with type 2 diabetes mellitus: is stable b/p 129/76 will continue norvasc 10 mg daily asa 81 mg daily    PREVIOUS   4. Dyslipidemia associated with type 2 diabetes mellitus: is stable will continue zocor 10 mg daily  5. Non-insulin dependent type 2 diabetes mellitus: is stable will continue metformin 1 gm twice daily tradjenta 5 mg daily is on statin and asa   6. Gastroesophageal reflux disease  without esophagitis: is stable will continue protonix 40 mg daily   7. Chronic non-seasonal allergic rhinitis: is stable will continue nasocort daily claritin to mg daily and singulair 10 mg daily   8. Herpes: is stable no recent outbreak will continue valtrex 500 mg daily        MD is aware of resident's narcotic use and is in agreement with current plan of care. We will attempt to wean resident as appropriate.  Synthia Innocent NP [facility]  Contact [phone] Monday through Friday 8am- 5pm  After hours call [phone]

## 2020-01-24 ENCOUNTER — Encounter: Payer: Self-pay | Admitting: Adult Health

## 2020-01-24 ENCOUNTER — Non-Acute Institutional Stay (SKILLED_NURSING_FACILITY): Payer: Medicare Other | Admitting: Adult Health

## 2020-01-24 DIAGNOSIS — R635 Abnormal weight gain: Secondary | ICD-10-CM | POA: Diagnosis not present

## 2020-01-24 NOTE — Progress Notes (Signed)
Location:    [facility] Room Number: 110W Place of Service:  SNF (31) Phillips Grout NP    CODE STATUS: FULL   Allergies  Allergen Reactions  . Lisinopril   . Penicillins Nausea Only    Has patient had a PCN reaction causing immediate rash, facial/tongue/throat swelling, SOB or lightheadedness with hypotension: Yes Has patient had a PCN reaction causing severe rash involving mucus membranes or skin necrosis: No Has patient had a PCN reaction that required hospitalization No Has patient had a PCN reaction occurring within the last 10 years: No If all of the above answers are "NO", then may proceed with Cephalosporin use.   . Sulfa Antibiotics Nausea And Vomiting    Chief Complaint  Patient presents with  . Acute Visit    Weight Gain     HPI:  She is gaining weight. Her weight in Jan was 155 pounds; current weight is 167 pounds. She denies any cough; no shortness of breath; no lower extremity edema. Her appetite is good.   Past Medical History:  Diagnosis Date  . Anxiety   . Bowel obstruction ([place])   . Burning with urination 01/04/2016  . Diabetes mellitus without complication ([place])   . Genital herpes   . GERD (gastroesophageal reflux disease)   . Hematuria 01/04/2016  . Herpes 12/22/2015  . Hyperlipidemia   . Hypertension   . LLQ abdominal tenderness 01/04/2016  . Neuropathy   . Pelvic pressure in female 01/04/2016  . Recurrent UTI   . Sciatic nerve disease   . Small bowel obstruction ([place]) 04/27/2016  . Urinary frequency 01/04/2016  . Urticaria     Past Surgical History:  Procedure Laterality Date  . ABDOMINAL HYSTERECTOMY    . APPLICATION OF INTRAOPERATIVE CT SCAN N/A 12/19/2018   Procedure: APPLICATION OF INTRAOPERATIVE CT SCAN;  Surgeon: Erline Levine, MD;  Location: [facility];  Service: Neurosurgery;  Laterality: N/A;  . bowel obstruction    . COLON SURGERY     blocked colon  . POSTERIOR CERVICAL FUSION/FORAMINOTOMY N/A 12/19/2018   Procedure: Cervical Seven to Thoracic One Posterior cervical fusion;  Surgeon: Erline Levine, MD;  Location: [facility];  Service: Neurosurgery;  Laterality: N/A;  Cervical Seven to Thoracic One Posterior cervical fusion    Social History   Socioeconomic History  . Marital status: Single    Spouse name: Not on file  . Number of children: Not on file  . Years of education: Not on file  . Highest education level: Not on file  Occupational History  . Not on file  Tobacco Use  . Smoking status: Never Smoker  . Smokeless tobacco: Current User    Types: Snuff  Substance and Sexual Activity  . Alcohol use: No  . Drug use: No  . Sexual activity: Not Currently    Birth control/protection: Surgical    Comment: hyst  Other Topics Concern  . Not on file  Social History Narrative  . Not on file   Social Determinants of Health   Financial Resource Strain:   . Difficulty of Paying Living Expenses: Not on file  Food Insecurity:   . Worried About Charity fundraiser in the Last Year: Not on file  . Ran Out of Food in the Last Year: Not on file  Transportation Needs:   . Lack of Transportation (Medical): Not on file  . Lack of Transportation (Non-Medical): Not on file  Physical Activity:   . Days of Exercise per  Week: Not on file  . Minutes of Exercise per Session: Not on file  Stress:   . Feeling of Stress : Not on file  Social Connections:   . Frequency of Communication with Friends and Family: Not on file  . Frequency of Social Gatherings with Friends and Family: Not on file  . Attends Religious Services: Not on file  . Active Member of Clubs or Organizations: Not on file  . Attends Banker Meetings: Not on file  . Marital Status: Not on file  Intimate Partner Violence:   . Fear of Current or Ex-Partner: Not on file  . Emotionally Abused: Not on file  . Physically Abused: Not on file  . Sexually Abused: Not on file   Family History  Problem Relation Age of Onset  .  Diabetes Mother   . Heart disease Brother   . Hypertension Brother   . Diabetes Brother   . Hypertension Daughter   . Diabetes Brother   . Diabetes Brother   . Alzheimer's disease Brother   . Anxiety disorder Daughter       VITAL SIGNS BP 133/72   Pulse 90   Temp 98.5 F (36.9 C) (Oral)   Resp 20   Ht 5' (1.524 m)   Wt 168 lb (76.2 kg)   SpO2 98%   BMI 32.81 kg/m   No facility-administered encounter medications on file as of 01/24/2020.   Outpatient Encounter Medications as of 01/24/2020  Medication Sig  . acetaminophen (TYLENOL) 650 MG CR tablet Take 650 mg by mouth every 8 (eight) hours as needed for pain.  Marland Kitchen amLODipine (NORVASC) 10 MG tablet Take 1 tablet (10 mg total) by mouth daily.  Marland Kitchen aspirin 81 MG chewable tablet Chew 81 mg by mouth daily.  Lucilla Lame [place]-Castor Oil (VENELEX) OINT Apply topically. apply to residents buttocks q shift PRN Every Shift - PRN  . DULoxetine (CYMBALTA) 60 MG capsule Take 60 mg by mouth daily.   . Emollient (EUCERIN) lotion Apply topically 4 (four) times daily as needed for dry skin.  Marland Kitchen lamoTRIgine (LAMICTAL) 25 MG tablet Take 25 mg by mouth 2 (two) times daily.   Marland Kitchen linagliptin (TRADJENTA) 5 MG TABS tablet Take 5 mg by mouth daily.  Marland Kitchen loratadine (CLARITIN) 10 MG tablet Take 10 mg by mouth daily.  . Melatonin 5 MG TABS Take 5 mg by mouth at bedtime.  . Menthol, Topical Analgesic, (BIOFREEZE ROLL-ON COLORLESS) 4 % GEL Apply 1 application topically daily as needed (FOR PAIN).   Marland Kitchen metFORMIN (GLUCOPHAGE) 1000 MG tablet Take 1,000 mg by mouth 2 (two) times daily with a meal.   . montelukast (SINGULAIR) 10 MG tablet Take 1 tablet (10 mg total) by mouth at bedtime.  . NON FORMULARY Diet:  Consistent Carbohydrate,  . pantoprazole (PROTONIX) 40 MG tablet Take 40 mg by mouth every morning.  . polyethylene glycol (MIRALAX / GLYCOLAX) packet Take 17 g by mouth daily.  Marland Kitchen senna-docusate (SENOKOT-S) 8.6-50 MG tablet Take 1 tablet by mouth at bedtime.   .  simvastatin (ZOCOR) 10 MG tablet Take 10 mg by mouth at bedtime.   . Sodium Fluoride (PREVIDENT 5000 BOOSTER) 1.1 % PSTE Place 1 application onto teeth at bedtime.   . traMADol (ULTRAM) 50 MG tablet Take 1 tablet (50 mg total) by mouth 2 (two) times daily.  Marland Kitchen triamcinolone (NASACORT ALLERGY 24HR) 55 MCG/ACT AERO nasal inhaler Place 1 spray into the nose daily.  . valACYclovir (VALTREX) 500 MG tablet Take  500 mg by mouth daily.   . [DISCONTINUED] gabapentin (NEURONTIN) 300 MG capsule Take 300 mg by mouth 2 (two) times daily. Reported on 02/03/2016     SIGNIFICANT DIAGNOSTIC EXAMS   PREVIOUS  12-18-19: chest x-ray: Mild right upper lobe and bibasilar airspace opacities which could reflect early pneumonia (including atypical/viral infection).  12-17-18; ct of abdomen and pelvis: Slight edema of the mucosa of the distal antrum of the stomach. This could represent gastritis. Otherwise, benign-appearing abdomen and pelvis.  NO NEW EXAMS.   LABS REVIEWED PREVIOUS;   12-16-19: urine culture: citrobacter youngae 12-18-19: wbc 16.6; hgb 14.7; hct 45.8; mcv 87.1 plt 330; glucose 168; bun 23; creat 0.85; k+ 3.7; na++ 136; ca 9.7; liver normal albumin 4.4 12-20-19: wbc 12.9; hgb 14.8; hct 48.2 ;mcv 90.4 plt 296 glucose 152; bun 33; creat 1.03 ;k+ 3.8; na++ 139; ca 9.7  01-02-20: wbc 14.5; hgb 12.9; hct 40.8 mcv 89.9 plt 171; glucose 128; bun 10; creat 0.80 ;k+ 4.3; na++ 138; ca 9.3 urine culture: multiple species.   NO NEW LABS.   Review of Systems  Constitutional: Negative for malaise/fatigue.  Respiratory: Negative for cough and shortness of breath.   Cardiovascular: Negative for chest pain, palpitations and leg swelling.  Gastrointestinal: Negative for abdominal pain, constipation and heartburn.  Musculoskeletal: Negative for back pain, joint pain and myalgias.  Skin: Negative.   Neurological: Negative for dizziness.  Psychiatric/Behavioral: The patient is not nervous/anxious.       Physical  Exam Constitutional:      General: She is not in acute distress.    Appearance: She is well-developed. She is not diaphoretic.  Neck:     Thyroid: No thyromegaly.  Cardiovascular:     Rate and Rhythm: Normal rate and regular rhythm.     Pulses: Normal pulses.     Heart sounds: Normal heart sounds.  Pulmonary:     Effort: Pulmonary effort is normal. No respiratory distress.     Breath sounds: Normal breath sounds.  Abdominal:     General: Bowel sounds are normal. There is no distension.     Palpations: Abdomen is soft.     Tenderness: There is no abdominal tenderness.  Musculoskeletal:        General: Normal range of motion.     Cervical back: Neck supple.     Right lower leg: No edema.     Left lower leg: No edema.  Lymphadenopathy:     Cervical: No cervical adenopathy.  Skin:    General: Skin is warm and dry.  Neurological:     Mental Status: She is alert. Mental status is at baseline.  Psychiatric:        Mood and Affect: Mood normal.     ASSESSMENT/ PLAN:  TODAY  1. Weight gain  Will not make changes at this time Will continue to monitor her status She is on cymbalta which is weight gainer.     MD is aware of resident's narcotic use and is in agreement with current plan of care. We will attempt to wean resident as appropriate.  Synthia Innocent NP [facility]  Contact [phone] Monday through Friday 8am- 5pm  After hours call [phone]

## 2020-01-28 DIAGNOSIS — R635 Abnormal weight gain: Secondary | ICD-10-CM | POA: Insufficient documentation

## 2020-01-29 ENCOUNTER — Encounter: Payer: Self-pay | Admitting: Internal Medicine

## 2020-01-29 ENCOUNTER — Non-Acute Institutional Stay (SKILLED_NURSING_FACILITY): Payer: Medicare Other | Admitting: Internal Medicine

## 2020-01-29 DIAGNOSIS — I10 Essential (primary) hypertension: Secondary | ICD-10-CM

## 2020-01-29 DIAGNOSIS — J3089 Other allergic rhinitis: Secondary | ICD-10-CM | POA: Diagnosis not present

## 2020-01-29 DIAGNOSIS — E1159 Type 2 diabetes mellitus with other circulatory complications: Secondary | ICD-10-CM | POA: Diagnosis not present

## 2020-01-29 DIAGNOSIS — K5909 Other constipation: Secondary | ICD-10-CM | POA: Diagnosis not present

## 2020-01-29 NOTE — Progress Notes (Signed)
Location:  [facility] Room Number: 110-W Place of Service:  SNF (31)  Avon Gully, MD  Patient Care Team: Avon Gully, MD as PCP - General (Internal Medicine)  Extended Emergency Contact Information Primary Emergency Contact: Atkins,Patty Address: [address]          [place], [place] [address] [place] of [place] Home Phone: [phone] Relation: Daughter Secondary Emergency Contact: GRIFFIN,TOMMY Mobile Phone: [phone] Relation: Son    Allergies: Lisinopril, Penicillins, and Sulfa antibiotics  Chief Complaint  Patient presents with  . Medical Management of Chronic Issues    Routine [facility] visit    HPI: Patient is a 79 y.o. female who is being seen for routine issues of hypertension, allergic rhinitis, and chronic constipation.  Past Medical History:  Diagnosis Date  . Anxiety   . Bowel obstruction ([facility])   . Burning with urination 01/04/2016  . Diabetes mellitus without complication ([facility])   . Genital herpes   . GERD (gastroesophageal reflux disease)   . Hematuria 01/04/2016  . Herpes 12/22/2015  . Hyperlipidemia   . Hypertension   . LLQ abdominal tenderness 01/04/2016  . Neuropathy   . Pelvic pressure in female 01/04/2016  . Recurrent UTI   . Sciatic nerve disease   . Small bowel obstruction ([facility]) 04/27/2016  . Urinary frequency 01/04/2016  . Urticaria     Past Surgical History:  Procedure Laterality Date  . ABDOMINAL HYSTERECTOMY    . APPLICATION OF INTRAOPERATIVE CT SCAN N/A 12/19/2018   Procedure: APPLICATION OF INTRAOPERATIVE CT SCAN;  Surgeon: Maeola Harman, MD;  Location: [facility] OR;  Service: Neurosurgery;  Laterality: N/A;  . bowel obstruction    . COLON SURGERY     blocked colon  . POSTERIOR CERVICAL FUSION/FORAMINOTOMY N/A 12/19/2018   Procedure: Cervical Seven to Thoracic One Posterior cervical fusion;  Surgeon: Maeola Harman, MD;  Location: [facility] OR;  Service: Neurosurgery;  Laterality: N/A;  Cervical  Seven to Thoracic One Posterior cervical fusion    Allergies as of 01/29/2020      Reactions   Lisinopril    Penicillins Nausea Only   Has patient had a PCN reaction causing immediate rash, facial/tongue/throat swelling, SOB or lightheadedness with hypotension: Yes Has patient had a PCN reaction causing severe rash involving mucus membranes or skin necrosis: No Has patient had a PCN reaction that required hospitalization No Has patient had a PCN reaction occurring within the last 10 years: No If all of the above answers are "NO", then may proceed with Cephalosporin use.   Sulfa Antibiotics Nausea And Vomiting      Medication List    Notice   This visit is during an admission. Changes to the med list made in this visit will be reflected in the After Visit Summary of the admission.    No current facility-administered medications on file prior to visit.   Current Outpatient Medications on File Prior to Visit  Medication Sig Dispense Refill  . acetaminophen (TYLENOL) 650 MG CR tablet Take 650 mg by mouth every 8 (eight) hours as needed for pain.    Marland Kitchen amLODipine (NORVASC) 10 MG tablet Take 1 tablet (10 mg total) by mouth daily. 30 tablet 0  . aspirin 81 MG chewable tablet Chew 81 mg by mouth daily.    Lucilla Lame [place]-Castor Oil (VENELEX) OINT Apply topically. apply to residents buttocks q shift PRN Every Shift - PRN    . DULoxetine (CYMBALTA) 60 MG capsule Take 60 mg by mouth  daily.     . Emollient (EUCERIN) lotion Apply topically 4 (four) times daily as needed for dry skin.    Marland Kitchen lamoTRIgine (LAMICTAL) 25 MG tablet Take 25 mg by mouth 2 (two) times daily.     Marland Kitchen linagliptin (TRADJENTA) 5 MG TABS tablet Take 5 mg by mouth daily.    Marland Kitchen loratadine (CLARITIN) 10 MG tablet Take 10 mg by mouth daily.    . Melatonin 5 MG TABS Take 5 mg by mouth at bedtime.    . Menthol, Topical Analgesic, (BIOFREEZE ROLL-ON COLORLESS) 4 % GEL Apply 1 application topically daily as needed (FOR PAIN).     Marland Kitchen  metFORMIN (GLUCOPHAGE) 1000 MG tablet Take 1,000 mg by mouth 2 (two) times daily with a meal.     . montelukast (SINGULAIR) 10 MG tablet Take 1 tablet (10 mg total) by mouth at bedtime. 30 tablet 5  . NON FORMULARY Diet:  Consistent Carbohydrate,    . pantoprazole (PROTONIX) 40 MG tablet Take 40 mg by mouth every morning.    . polyethylene glycol (MIRALAX / GLYCOLAX) packet Take 17 g by mouth daily.    Marland Kitchen senna-docusate (SENOKOT-S) 8.6-50 MG tablet Take 1 tablet by mouth at bedtime.     . simvastatin (ZOCOR) 10 MG tablet Take 10 mg by mouth at bedtime.     . Sodium Fluoride (PREVIDENT 5000 BOOSTER) 1.1 % PSTE Place 1 application onto teeth at bedtime.     . traMADol (ULTRAM) 50 MG tablet Take 1 tablet (50 mg total) by mouth 2 (two) times daily. 60 tablet 0  . triamcinolone (NASACORT ALLERGY 24HR) 55 MCG/ACT AERO nasal inhaler Place 1 spray into the nose daily.    . valACYclovir (VALTREX) 500 MG tablet Take 500 mg by mouth daily.     . [DISCONTINUED] gabapentin (NEURONTIN) 300 MG capsule Take 300 mg by mouth 2 (two) times daily. Reported on 02/03/2016       No orders of the defined types were placed in this encounter.   Immunization History  Administered Date(s) Administered  . Influenza-Unspecified 09/16/2019    Social History   Tobacco Use  . Smoking status: Never Smoker  . Smokeless tobacco: Current User    Types: Snuff  Substance Use Topics  . Alcohol use: No    Review of Systems  GENERAL:  no fevers, fatigue, appetite changes SKIN: No itching, rash HEENT: No complaint RESPIRATORY: No cough, wheezing, SOB CARDIAC: No chest pain, palpitations, lower extremity edema  GI: No abdominal pain, No N/V/D or constipation, No heartburn or reflux  GU: No dysuria, frequency or urgency, or incontinence  MUSCULOSKELETAL: No unrelieved bone/joint pain NEUROLOGIC: No headache, dizziness  PSYCHIATRIC: No overt anxiety or sadness  Vitals:   01/29/20 0907  BP: 133/72  Pulse: 90    Resp: 20  Temp: 98.2 F (36.8 C)  SpO2: 98%   Body mass index is 32.81 kg/m. Physical Exam  GENERAL APPEARANCE: Alert, conversant, No acute distress  SKIN: No diaphoresis rash HEENT: Unremarkable RESPIRATORY: Breathing is even, unlabored. Lung sounds are clear   CARDIOVASCULAR: Heart RRR no murmurs, rubs or gallops. No peripheral edema  GASTROINTESTINAL: Abdomen is soft, non-tender, not distended w/ normal bowel sounds.  GENITOURINARY: Bladder non tender, not distended  MUSCULOSKELETAL: No abnormal joints or musculature NEUROLOGIC: Cranial nerves 2-12 grossly intact. Moves all extremities PSYCHIATRIC: Mood and affect appropriate to situation, no behavioral issues  Patient Active Problem List   Diagnosis Date Noted  . Weight gain 01/28/2020  . Dyslipidemia  associated with type 2 diabetes mellitus ([place]) 12/24/2019  . Chronic non-seasonal allergic rhinitis 12/24/2019  . Acute UTI 12/24/2019  . Chronic constipation 12/24/2019  . Major depression, recurrent, chronic ([place]) 12/24/2019  . Cord compression ([place]) 12/15/2018  . Non-insulin dependent type 2 diabetes mellitus ([place]) 04/27/2016  . Depression 04/27/2016  . Anxiety state 04/27/2016  . GERD (gastroesophageal reflux disease) 04/27/2016  . Generalized weakness 02/03/2016  . Hypertension associated with type 2 diabetes mellitus ([place]) 02/03/2016  . Urinary frequency 01/04/2016  . Hematuria 01/04/2016  . Herpes 12/22/2015    CMP     Component Value Date/Time   NA 138 01/02/2020 1145   K 4.3 01/02/2020 1145   CL 98 01/02/2020 1145   CO2 27 01/02/2020 1145   GLUCOSE 128 (H) 01/02/2020 1145   BUN 10 01/02/2020 1145   CREATININE 0.80 01/02/2020 1145   CALCIUM 9.3 01/02/2020 1145   PROT 7.9 12/18/2019 1047   ALBUMIN 4.4 12/18/2019 1047   AST 19 12/18/2019 1047   ALT 18 12/18/2019 1047   ALKPHOS 90 12/18/2019 1047   BILITOT 0.8 12/18/2019 1047   GFRNONAA >60 01/02/2020 1145   GFRAA >60 01/02/2020 1145   Recent Labs     12/18/19 1047 12/20/19 1858 01/02/20 1145  NA 136 139 138  K 3.7 3.8 4.3  CL 95* 100 98  CO2 24 25 27   GLUCOSE 168* 152* 128*  BUN 23 33* 10  CREATININE 0.85 1.03* 0.80  CALCIUM 9.7 9.7 9.3   Recent Labs    03/27/19 0446 06/18/19 1341 12/18/19 1047  AST 15 19 19   ALT 13 18 18   ALKPHOS 89 81 90  BILITOT 0.5 0.3 0.8  PROT 6.9 7.8 7.9  ALBUMIN 3.8 4.6 4.4   Recent Labs    12/16/19 1346 12/16/19 1346 12/18/19 1047 12/20/19 1858 01/02/20 1145  WBC 13.2*   < > 16.6* 12.9* 14.5*  NEUTROABS 9.4*  --  12.8*  --  10.4*  HGB 14.4   < > 14.7 14.8 12.9  HCT 46.2*   < > 45.8 48.2* 40.8  MCV 88.0   < > 87.1 90.4 89.9  PLT 309   < > 330 296 171   < > = values in this interval not displayed.   No results for input(s): CHOL, LDLCALC, TRIG in the last 8760 hours.  Invalid input(s): HCL No results found for: MICROALBUR Lab Results  Component Value Date   TSH 3.920 10/09/2018   Lab Results  Component Value Date   HGBA1C 7.5 (H) 12/14/2018   No results found for: CHOL, HDL, LDLCALC, LDLDIRECT, TRIG, CHOLHDL  Significant Diagnostic Results in last 30 days:  No results found.  Assessment and Plan  Hypertension associated with type 2 diabetes mellitus (H[facility]) Controlled; continue Norvasc 10 mg daily since  Chronic non-seasonal allergic rhinitis Chronic and stable; continue Claritin 10 mg daily  Chronic constipation Chronic and stable; continue MiraLAX daily and Senokot S1 p.o. nightly    Hennie Duos, MD

## 2020-02-02 ENCOUNTER — Encounter: Payer: Self-pay | Admitting: Internal Medicine

## 2020-02-02 NOTE — Assessment & Plan Note (Signed)
Controlled; continue Norvasc 10 mg daily since

## 2020-02-02 NOTE — Assessment & Plan Note (Signed)
Chronic and stable; continue MiraLAX daily and Senokot S1 p.o. nightly

## 2020-02-02 NOTE — Assessment & Plan Note (Signed)
Chronic and stable; continue Claritin 10 mg daily 

## 2020-02-05 ENCOUNTER — Other Ambulatory Visit: Payer: Self-pay | Admitting: Adult Health

## 2020-02-05 MED ORDER — TRAMADOL HCL 50 MG PO TABS: 50.0000 mg | ORAL_TABLET | Freq: Two times a day (BID) | ORAL | 0 refills | Status: DC

## 2020-02-19 ENCOUNTER — Non-Acute Institutional Stay: Payer: Self-pay | Admitting: Adult Health

## 2020-02-19 DIAGNOSIS — G8929 Other chronic pain: Secondary | ICD-10-CM | POA: Diagnosis not present

## 2020-02-19 DIAGNOSIS — R52 Pain, unspecified: Secondary | ICD-10-CM

## 2020-02-19 DIAGNOSIS — F339 Major depressive disorder, recurrent, unspecified: Secondary | ICD-10-CM

## 2020-02-20 ENCOUNTER — Non-Acute Institutional Stay (SKILLED_NURSING_FACILITY): Payer: Medicare Other | Admitting: Adult Health

## 2020-02-20 ENCOUNTER — Encounter: Payer: Self-pay | Admitting: Adult Health

## 2020-02-20 DIAGNOSIS — E119 Type 2 diabetes mellitus without complications: Secondary | ICD-10-CM | POA: Diagnosis not present

## 2020-02-20 DIAGNOSIS — K219 Gastro-esophageal reflux disease without esophagitis: Secondary | ICD-10-CM | POA: Diagnosis not present

## 2020-02-20 DIAGNOSIS — E785 Hyperlipidemia, unspecified: Secondary | ICD-10-CM

## 2020-02-20 DIAGNOSIS — E1169 Type 2 diabetes mellitus with other specified complication: Secondary | ICD-10-CM | POA: Diagnosis not present

## 2020-02-20 NOTE — Progress Notes (Signed)
Location:    [facility] Room Number: 110/W Place of Service:  SNF (31)   CODE STATUS: Full Code  Allergies  Allergen Reactions  . Lisinopril   . Penicillins Nausea Only    Has patient had a PCN reaction causing immediate rash, facial/tongue/throat swelling, SOB or lightheadedness with hypotension: Yes Has patient had a PCN reaction causing severe rash involving mucus membranes or skin necrosis: No Has patient had a PCN reaction that required hospitalization No Has patient had a PCN reaction occurring within the last 10 years: No If all of the above answers are "NO", then may proceed with Cephalosporin use.   . Sulfa Antibiotics Nausea And Vomiting    Chief Complaint  Patient presents with  . Medical Management of Chronic Issues        Dyslipidemia associated with type 2 diabetes mellitus:   Non-insulin dependent type 2 diabetes mellitus:   Gastroesophageal reflux disease without esophagitis    HPI:  She is a 79 year old long term resident of this facility being seen for the management or her chronic illnesses: dyslipidemia; diabetes; gerd. There are no reports of uncontrolled pain; no changes in appetite; no complaints of heart burn or constipation.   Past Medical History:  Diagnosis Date  . Anxiety   . Bowel obstruction (HCC)   . Burning with urination 01/04/2016  . Diabetes mellitus without complication (HCC)   . Genital herpes   . GERD (gastroesophageal reflux disease)   . Hematuria 01/04/2016  . Herpes 12/22/2015  . Hyperlipidemia   . Hypertension   . LLQ abdominal tenderness 01/04/2016  . Neuropathy   . Pelvic pressure in female 01/04/2016  . Recurrent UTI   . Sciatic nerve disease   . Small bowel obstruction (HCC) 04/27/2016  . Urinary frequency 01/04/2016  . Urticaria     Past Surgical History:  Procedure Laterality Date  . ABDOMINAL HYSTERECTOMY    . APPLICATION OF INTRAOPERATIVE CT SCAN N/A 12/19/2018   Procedure: APPLICATION OF  INTRAOPERATIVE CT SCAN;  Surgeon: Maeola Harman, MD;  Location: [name] OR;  Service: Neurosurgery;  Laterality: N/A;  . bowel obstruction    . COLON SURGERY     blocked colon  . POSTERIOR CERVICAL FUSION/FORAMINOTOMY N/A 12/19/2018   Procedure: Cervical Seven to Thoracic One Posterior cervical fusion;  Surgeon: Maeola Harman, MD;  Location: [facility] OR;  Service: Neurosurgery;  Laterality: N/A;  Cervical Seven to Thoracic One Posterior cervical fusion    Social History   Socioeconomic History  . Marital status: Single    Spouse name: Not on file  . Number of children: Not on file  . Years of education: Not on file  . Highest education level: Not on file  Occupational History  . Not on file  Tobacco Use  . Smoking status: Never Smoker  . Smokeless tobacco: Current User    Types: Snuff  Substance and Sexual Activity  . Alcohol use: No  . Drug use: No  . Sexual activity: Not Currently    Birth control/protection: Surgical    Comment: hyst  Other Topics Concern  . Not on file  Social History Narrative  . Not on file   Social Determinants of Health   Financial Resource Strain:   . Difficulty of Paying Living Expenses:   Food Insecurity:   . Worried About Programme researcher, broadcasting/film/video in the Last Year:   . Barista in the Last Year:   Transportation Needs:   .  Lack of Transportation (Medical):   Marland Kitchen Lack of Transportation (Non-Medical):   Physical Activity:   . Days of Exercise per Week:   . Minutes of Exercise per Session:   Stress:   . Feeling of Stress :   Social Connections:   . Frequency of Communication with Friends and Family:   . Frequency of Social Gatherings with Friends and Family:   . Attends Religious Services:   . Active Member of Clubs or Organizations:   . Attends Banker Meetings:   Marland Kitchen Marital Status:   Intimate Partner Violence:   . Fear of Current or Ex-Partner:   . Emotionally Abused:   Marland Kitchen Physically Abused:   . Sexually Abused:    Family History    Problem Relation Age of Onset  . Diabetes Mother   . Heart disease Brother   . Hypertension Brother   . Diabetes Brother   . Hypertension Daughter   . Diabetes Brother   . Diabetes Brother   . Alzheimer's disease Brother   . Anxiety disorder Daughter       VITAL SIGNS BP 128/73   Pulse 75   Temp 98.6 F (37 C) (Oral)   Resp 18   Ht 5' (1.524 m)   Wt 166 lb 3.2 oz (75.4 kg)   SpO2 98%   BMI 32.46 kg/m   No facility-administered encounter medications on file as of 02/20/2020.   Outpatient Encounter Medications as of 02/20/2020  Medication Sig  . acetaminophen (TYLENOL) 650 MG CR tablet Take 650 mg by mouth every 8 (eight) hours as needed for pain.  Marland Kitchen amLODipine (NORVASC) 10 MG tablet Take 1 tablet (10 mg total) by mouth daily.  Marland Kitchen aspirin 81 MG chewable tablet Chew 81 mg by mouth daily.  Lucilla Lame [place]-Castor Oil (VENELEX) OINT Apply topically. apply to residents buttocks q shift PRN Every Shift - PRN  . DULoxetine (CYMBALTA) 60 MG capsule Take 60 mg by mouth daily.   . Emollient (EUCERIN) lotion Apply topically 4 (four) times daily as needed for dry skin.  Marland Kitchen lamoTRIgine (LAMICTAL) 25 MG tablet Take 25 mg by mouth 2 (two) times daily.   Marland Kitchen linagliptin (TRADJENTA) 5 MG TABS tablet Take 5 mg by mouth daily.  Marland Kitchen loratadine (CLARITIN) 10 MG tablet Take 10 mg by mouth daily.  . Melatonin 5 MG TABS Take 5 mg by mouth at bedtime.  . Menthol, Topical Analgesic, (BIOFREEZE ROLL-ON COLORLESS) 4 % GEL Apply 1 application topically daily as needed (FOR PAIN).   Marland Kitchen metFORMIN (GLUCOPHAGE) 1000 MG tablet Take 1,000 mg by mouth 2 (two) times daily with a meal.   . montelukast (SINGULAIR) 10 MG tablet Take 1 tablet (10 mg total) by mouth at bedtime.  . NON FORMULARY Diet:  Consistent Carbohydrate,  . pantoprazole (PROTONIX) 40 MG tablet Take 40 mg by mouth every morning.  . polyethylene glycol (MIRALAX / GLYCOLAX) packet Take 17 g by mouth daily.  Marland Kitchen senna-docusate (SENOKOT-S) 8.6-50 MG tablet  Take 1 tablet by mouth at bedtime.   . simvastatin (ZOCOR) 10 MG tablet Take 10 mg by mouth at bedtime.   . Sodium Fluoride (PREVIDENT 5000 BOOSTER) 1.1 % PSTE Place 1 application onto teeth at bedtime.   . traMADol (ULTRAM) 50 MG tablet Take 1 tablet (50 mg total) by mouth 2 (two) times daily.  Marland Kitchen triamcinolone (NASACORT ALLERGY 24HR) 55 MCG/ACT AERO nasal inhaler Place 1 spray into the nose daily.  . valACYclovir (VALTREX) 500 MG tablet Take 500  mg by mouth daily.   . [DISCONTINUED] gabapentin (NEURONTIN) 300 MG capsule Take 300 mg by mouth 2 (two) times daily. Reported on 02/03/2016     SIGNIFICANT DIAGNOSTIC EXAMS   PREVIOUS  12-18-19: chest x-ray: Mild right upper lobe and bibasilar airspace opacities which could reflect early pneumonia (including atypical/viral infection).  12-17-18; ct of abdomen and pelvis: Slight edema of the mucosa of the distal antrum of the stomach. This could represent gastritis. Otherwise, benign-appearing abdomen and pelvis.  NO NEW EXAMS.   LABS REVIEWED PREVIOUS;   12-16-19: urine culture: citrobacter youngae 12-18-19: wbc 16.6; hgb 14.7; hct 45.8; mcv 87.1 plt 330; glucose 168; bun 23; creat 0.85; k+ 3.7; na++ 136; ca 9.7; liver normal albumin 4.4 12-20-19: wbc 12.9; hgb 14.8; hct 48.2 ;mcv 90.4 plt 296 glucose 152; bun 33; creat 1.03 ;k+ 3.8; na++ 139; ca 9.7  01-02-20: wbc 14.5; hgb 12.9; hct 40.8 mcv 89.9 plt 171; glucose 128; bun 10; creat 0.80 ;k+ 4.3; na++ 138; ca 9.3 urine culture: multiple species.   NO NEW LABS.     Review of Systems  Constitutional: Negative for malaise/fatigue.  Respiratory: Negative for cough and shortness of breath.   Cardiovascular: Negative for chest pain, palpitations and leg swelling.  Gastrointestinal: Negative for abdominal pain, constipation and heartburn.  Musculoskeletal: Negative for back pain, joint pain and myalgias.  Skin: Negative.   Neurological: Negative for dizziness.  Psychiatric/Behavioral: The patient is  not nervous/anxious.     Physical Exam Constitutional:      General: She is not in acute distress.    Appearance: She is well-developed. She is not diaphoretic.  Neck:     Thyroid: No thyromegaly.  Cardiovascular:     Rate and Rhythm: Normal rate and regular rhythm.     Pulses: Normal pulses.     Heart sounds: Normal heart sounds.  Pulmonary:     Effort: Pulmonary effort is normal. No respiratory distress.     Breath sounds: Normal breath sounds.  Abdominal:     General: Bowel sounds are normal. There is no distension.     Palpations: Abdomen is soft.     Tenderness: There is no abdominal tenderness.  Musculoskeletal:        General: Normal range of motion.     Cervical back: Neck supple.     Right lower leg: No edema.     Left lower leg: No edema.  Lymphadenopathy:     Cervical: No cervical adenopathy.  Skin:    General: Skin is warm and dry.  Neurological:     Mental Status: She is alert. Mental status is at baseline.  Psychiatric:        Mood and Affect: Mood normal.     ASSESSMENT/ PLAN:  TODAY  1. Dyslipidemia associated with type 2 diabetes mellitus: is stable will continue zocor 10 mg daily   2. Non-insulin dependent type 2 diabetes mellitus: is stable will continue metformin 1 gm twice daily tradjenta 5 mg daily is on status and asa   3. Gastroesophageal reflux disease without esophagitis: Is stable will continue protonix 40 mg daily    PREVIOUS   4. Chronic non-seasonal allergic rhinitis: is stable will continue nasocort daily claritin to mg daily and singulair 10 mg daily   5. Herpes: is stable no recent outbreak will continue valtrex 500 mg daily   6. Chronic constipation: is stable will continue miralax and senna s daily   7. Major depression recurrent chronic: is stable will continue  cymbalta 60 mg daily lamictal 50 mg twice daily for mood and melatonin 5 mg nightly   8. Hypertension associated with type 2 diabetes mellitus: is stable b/p 128/73  will continue norvasc 10 mg daily asa 81 mg daily       MD is aware of resident's narcotic use and is in agreement with current plan of care. We will attempt to wean resident as appropriate.  Synthia Innocent NP [facility]  Contact [phone] Monday through Friday 8am- 5pm  After hours call [phone]

## 2020-02-24 ENCOUNTER — Other Ambulatory Visit (HOSPITAL_COMMUNITY)
Admission: RE | Admit: 2020-02-24 | Discharge: 2020-02-24 | Disposition: A | Discharge status: Home / Self Care | Payer: Medicare Other | Source: Skilled Nursing Facility | Attending: Adult Health | Admitting: Adult Health

## 2020-02-24 DIAGNOSIS — G8929 Other chronic pain: Secondary | ICD-10-CM | POA: Insufficient documentation

## 2020-02-24 DIAGNOSIS — R52 Pain, unspecified: Secondary | ICD-10-CM | POA: Insufficient documentation

## 2020-02-24 DIAGNOSIS — E119 Type 2 diabetes mellitus without complications: Secondary | ICD-10-CM | POA: Insufficient documentation

## 2020-02-24 LAB — LIPID PANEL
Cholesterol: 147 mg/dL (ref 0–200)
HDL: 52 mg/dL (ref >40)
LDL Cholesterol: 71 mg/dL (ref 0–99)
Total CHOL/HDL Ratio: 2.8 RATIO
Triglycerides: 120 mg/dL (ref <150)
VLDL: 24 mg/dL (ref 0–40)

## 2020-02-24 LAB — HEMOGLOBIN A1C
Hgb A1c MFr Bld: 6.2 % — ABNORMAL HIGH (ref 4.8–5.6)
Mean Plasma Glucose: 131.24 mg/dL

## 2020-02-24 NOTE — Progress Notes (Signed)
[facility] Room Number: 111 Place of Service:  SNF (31)   CODE STATUS: full code   Allergies  Allergen Reactions  . Lisinopril   . Penicillins Nausea Only    Has patient had a PCN reaction causing immediate rash, facial/tongue/throat swelling, SOB or lightheadedness with hypotension: Yes Has patient had a PCN reaction causing severe rash involving mucus membranes or skin necrosis: No Has patient had a PCN reaction that required hospitalization No Has patient had a PCN reaction occurring within the last 10 years: No If all of the above answers are "NO", then may proceed with Cephalosporin use.   . Sulfa Antibiotics Nausea And Vomiting    Chief Complaint  Patient presents with  . Acute Visit    medication review     HPI:  She is taking lamictal 50 mg twice daily cymbalta 60 mg daily for major depression. She is taking ultram 50 mg twice daily for chronic pain. She is in a good place emotionally. There are no reports anxiety; agitation or depressive thoughts. No reports of uncontrolled pain. Lowering her medications at this time have the potential to cause her emotional harm.   Past Medical History:  Diagnosis Date  . Anxiety   . Bowel obstruction ([place])   . Burning with urination 01/04/2016  . Diabetes mellitus without complication ([place])   . Genital herpes   . GERD (gastroesophageal reflux disease)   . Hematuria 01/04/2016  . Herpes 12/22/2015  . Hyperlipidemia   . Hypertension   . LLQ abdominal tenderness 01/04/2016  . Neuropathy   . Pelvic pressure in female 01/04/2016  . Recurrent UTI   . Sciatic nerve disease   . Small bowel obstruction ([place]) 04/27/2016  . Urinary frequency 01/04/2016  . Urticaria     Past Surgical History:  Procedure Laterality Date  . ABDOMINAL HYSTERECTOMY    . APPLICATION OF INTRAOPERATIVE CT SCAN N/A 12/19/2018   Procedure: APPLICATION OF INTRAOPERATIVE CT SCAN;  Surgeon: Erline Levine, MD;  Location: [place];  Service:  Neurosurgery;  Laterality: N/A;  . bowel obstruction    . COLON SURGERY     blocked colon  . POSTERIOR CERVICAL FUSION/FORAMINOTOMY N/A 12/19/2018   Procedure: Cervical Seven to Thoracic One Posterior cervical fusion;  Surgeon: Erline Levine, MD;  Location: [place];  Service: Neurosurgery;  Laterality: N/A;  Cervical Seven to Thoracic One Posterior cervical fusion    Social History   Socioeconomic History  . Marital status: Single    Spouse name: Not on file  . Number of children: Not on file  . Years of education: Not on file  . Highest education level: Not on file  Occupational History  . Not on file  Tobacco Use  . Smoking status: Never Smoker  . Smokeless tobacco: Current User    Types: Snuff  Substance and Sexual Activity  . Alcohol use: No  . Drug use: No  . Sexual activity: Not Currently    Birth control/protection: Surgical    Comment: hyst  Other Topics Concern  . Not on file  Social History Narrative  . Not on file   Social Determinants of Health   Financial Resource Strain:   . Difficulty of Paying Living Expenses:   Food Insecurity:   . Worried About Charity fundraiser in the Last Year:   . Arboriculturist in the Last Year:   Transportation Needs:   . Film/video editor (Medical):   Marland Kitchen  Lack of Transportation (Non-Medical):   Physical Activity:   . Days of Exercise per Week:   . Minutes of Exercise per Session:   Stress:   . Feeling of Stress :   Social Connections:   . Frequency of Communication with Friends and Family:   . Frequency of Social Gatherings with Friends and Family:   . Attends Religious Services:   . Active Member of Clubs or Organizations:   . Attends Banker Meetings:   Marland Kitchen Marital Status:   Intimate Partner Violence:   . Fear of Current or Ex-Partner:   . Emotionally Abused:   Marland Kitchen Physically Abused:   . Sexually Abused:    Family History  Problem Relation Age of Onset  . Diabetes Mother   . Heart disease Brother     . Hypertension Brother   . Diabetes Brother   . Hypertension Daughter   . Diabetes Brother   . Diabetes Brother   . Alzheimer's disease Brother   . Anxiety disorder Daughter       VITAL SIGNS BP 128/78   Pulse 70   Temp 98.6 F (37 C)   Resp 16   Ht 5' (1.524 m)   Wt 166 lb (75.3 kg)   BMI 32.42 kg/m   No facility-administered encounter medications on file as of 02/19/2020.   Outpatient Encounter Medications as of 02/19/2020  Medication Sig  . acetaminophen (TYLENOL) 650 MG CR tablet Take 650 mg by mouth every 8 (eight) hours as needed for pain.  Marland Kitchen amLODipine (NORVASC) 10 MG tablet Take 1 tablet (10 mg total) by mouth daily.  Marland Kitchen aspirin 81 MG chewable tablet Chew 81 mg by mouth daily.  Lucilla Lame [place]-Castor Oil (VENELEX) OINT Apply topically. apply to residents buttocks q shift PRN Every Shift - PRN  . DULoxetine (CYMBALTA) 60 MG capsule Take 60 mg by mouth daily.   . Emollient (EUCERIN) lotion Apply topically 4 (four) times daily as needed for dry skin.  Marland Kitchen lamoTRIgine (LAMICTAL) 25 MG tablet Take 25 mg by mouth 2 (two) times daily.   Marland Kitchen linagliptin (TRADJENTA) 5 MG TABS tablet Take 5 mg by mouth daily.  Marland Kitchen loratadine (CLARITIN) 10 MG tablet Take 10 mg by mouth daily.  . Melatonin 5 MG TABS Take 5 mg by mouth at bedtime.  . Menthol, Topical Analgesic, (BIOFREEZE ROLL-ON COLORLESS) 4 % GEL Apply 1 application topically daily as needed (FOR PAIN).   Marland Kitchen metFORMIN (GLUCOPHAGE) 1000 MG tablet Take 1,000 mg by mouth 2 (two) times daily with a meal.   . montelukast (SINGULAIR) 10 MG tablet Take 1 tablet (10 mg total) by mouth at bedtime.  . NON FORMULARY Diet:  Consistent Carbohydrate,  . pantoprazole (PROTONIX) 40 MG tablet Take 40 mg by mouth every morning.  . polyethylene glycol (MIRALAX / GLYCOLAX) packet Take 17 g by mouth daily.  Marland Kitchen senna-docusate (SENOKOT-S) 8.6-50 MG tablet Take 1 tablet by mouth at bedtime.   . simvastatin (ZOCOR) 10 MG tablet Take 10 mg by mouth at bedtime.    . Sodium Fluoride (PREVIDENT 5000 BOOSTER) 1.1 % PSTE Place 1 application onto teeth at bedtime.   . traMADol (ULTRAM) 50 MG tablet Take 1 tablet (50 mg total) by mouth 2 (two) times daily.  Marland Kitchen triamcinolone (NASACORT ALLERGY 24HR) 55 MCG/ACT AERO nasal inhaler Place 1 spray into the nose daily.  . valACYclovir (VALTREX) 500 MG tablet Take 500 mg by mouth daily.      SIGNIFICANT DIAGNOSTIC EXAMS  PREVIOUS  12-18-19: chest x-ray: Mild right upper lobe and bibasilar airspace opacities which could reflect early pneumonia (including atypical/viral infection).  12-17-18; ct of abdomen and pelvis: Slight edema of the mucosa of the distal antrum of the stomach. This could represent gastritis. Otherwise, benign-appearing abdomen and pelvis.  NO NEW EXAMS.   LABS REVIEWED PREVIOUS;   12-16-19: urine culture: citrobacter youngae 12-18-19: wbc 16.6; hgb 14.7; hct 45.8; mcv 87.1 plt 330; glucose 168; bun 23; creat 0.85; k+ 3.7; na++ 136; ca 9.7; liver normal albumin 4.4 12-20-19: wbc 12.9; hgb 14.8; hct 48.2 ;mcv 90.4 plt 296 glucose 152; bun 33; creat 1.03 ;k+ 3.8; na++ 139; ca 9.7  01-02-20: wbc 14.5; hgb 12.9; hct 40.8 mcv 89.9 plt 171; glucose 128; bun 10; creat 0.80 ;k+ 4.3; na++ 138; ca 9.3 urine culture: multiple species.   NO NEW LABS.   Review of Systems  Constitutional: Negative for malaise/fatigue.  Respiratory: Negative for cough and shortness of breath.   Cardiovascular: Negative for chest pain, palpitations and leg swelling.  Gastrointestinal: Negative for abdominal pain, constipation and heartburn.  Musculoskeletal: Negative for back pain, joint pain and myalgias.  Skin: Negative.   Neurological: Negative for dizziness.  Psychiatric/Behavioral: The patient is not nervous/anxious.    Physical Exam Constitutional:      General: She is not in acute distress.    Appearance: She is well-developed. She is not diaphoretic.  Neck:     Thyroid: No thyromegaly.  Cardiovascular:     Rate  and Rhythm: Normal rate and regular rhythm.     Pulses: Normal pulses.     Heart sounds: Normal heart sounds.  Pulmonary:     Effort: Pulmonary effort is normal. No respiratory distress.     Breath sounds: Normal breath sounds.  Abdominal:     General: Bowel sounds are normal. There is no distension.     Palpations: Abdomen is soft.     Tenderness: There is no abdominal tenderness.  Musculoskeletal:        General: Normal range of motion.     Cervical back: Neck supple.     Right lower leg: No edema.     Left lower leg: No edema.  Lymphadenopathy:     Cervical: No cervical adenopathy.  Skin:    General: Skin is warm and dry.  Neurological:     Mental Status: She is alert. Mental status is at baseline.  Psychiatric:        Mood and Affect: Mood normal.       ASSESSMENT/ PLAN:  TODAY  1. Major depression recurrent chronic 2. Chronic generalized pain Will continue her current medications: ultram 50 mg twice daily lamictal 50 mg twice daily cymbalta 60 mg daily will monitor her status.   MD is aware of resident's narcotic use and is in agreement with current plan of care. We will attempt to wean resident as appropriate.  Synthia Innocent NP [facility]  Contact [phone] Monday through Friday 8am- 5pm  After hours call [phone]

## 2020-02-26 ENCOUNTER — Encounter: Payer: Self-pay | Admitting: Adult Health

## 2020-02-26 NOTE — Progress Notes (Signed)
Location:    [facility] Room Number: 110/W Place of Service:  SNF (31)   CODE STATUS: Full Code  Allergies  Allergen Reactions  . Lisinopril   . Penicillins Nausea Only    Has patient had a PCN reaction causing immediate rash, facial/tongue/throat swelling, SOB or lightheadedness with hypotension: Yes Has patient had a PCN reaction causing severe rash involving mucus membranes or skin necrosis: No Has patient had a PCN reaction that required hospitalization No Has patient had a PCN reaction occurring within the last 10 years: No If all of the above answers are "NO", then may proceed with Cephalosporin use.   . Sulfa Antibiotics Nausea And Vomiting    Chief Complaint  Patient presents with  . Acute Visit    Pain in Feet    HPI:    Past Medical History:  Diagnosis Date  . Anxiety   . Bowel obstruction (HCC)   . Burning with urination 01/04/2016  . Diabetes mellitus without complication (HCC)   . Genital herpes   . GERD (gastroesophageal reflux disease)   . Hematuria 01/04/2016  . Herpes 12/22/2015  . Hyperlipidemia   . Hypertension   . LLQ abdominal tenderness 01/04/2016  . Neuropathy   . Pelvic pressure in female 01/04/2016  . Recurrent UTI   . Sciatic nerve disease   . Small bowel obstruction (HCC) 04/27/2016  . Urinary frequency 01/04/2016  . Urticaria     Past Surgical History:  Procedure Laterality Date  . ABDOMINAL HYSTERECTOMY    . APPLICATION OF INTRAOPERATIVE CT SCAN N/A 12/19/2018   Procedure: APPLICATION OF INTRAOPERATIVE CT SCAN;  Surgeon: Maeola Harman, MD;  Location: [facility] OR;  Service: Neurosurgery;  Laterality: N/A;  . bowel obstruction    . COLON SURGERY     blocked colon  . POSTERIOR CERVICAL FUSION/FORAMINOTOMY N/A 12/19/2018   Procedure: Cervical Seven to Thoracic One Posterior cervical fusion;  Surgeon: Maeola Harman, MD;  Location: [facility] OR;  Service: Neurosurgery;  Laterality: N/A;  Cervical Seven to Thoracic One Posterior  cervical fusion    Social History   Socioeconomic History  . Marital status: Single    Spouse name: Not on file  . Number of children: Not on file  . Years of education: Not on file  . Highest education level: Not on file  Occupational History  . Not on file  Tobacco Use  . Smoking status: Never Smoker  . Smokeless tobacco: Current User    Types: Snuff  Substance and Sexual Activity  . Alcohol use: No  . Drug use: No  . Sexual activity: Not Currently    Birth control/protection: Surgical    Comment: hyst  Other Topics Concern  . Not on file  Social History Narrative  . Not on file   Social Determinants of Health   Financial Resource Strain:   . Difficulty of Paying Living Expenses:   Food Insecurity:   . Worried About Programme researcher, broadcasting/film/video in the Last Year:   . Barista in the Last Year:   Transportation Needs:   . Freight forwarder (Medical):   Marland Kitchen Lack of Transportation (Non-Medical):   Physical Activity:   . Days of Exercise per Week:   . Minutes of Exercise per Session:   Stress:   . Feeling of Stress :   Social Connections:   . Frequency of Communication with Friends and Family:   . Frequency of Social Gatherings with Friends and Family:   .  Attends Religious Services:   . Active Member of Clubs or Organizations:   . Attends Archivist Meetings:   Marland Kitchen Marital Status:   Intimate Partner Violence:   . Fear of Current or Ex-Partner:   . Emotionally Abused:   Marland Kitchen Physically Abused:   . Sexually Abused:    Family History  Problem Relation Age of Onset  . Diabetes Mother   . Heart disease Brother   . Hypertension Brother   . Diabetes Brother   . Hypertension Daughter   . Diabetes Brother   . Diabetes Brother   . Alzheimer's disease Brother   . Anxiety disorder Daughter       VITAL SIGNS BP 128/78   Pulse 70   Temp 98.6 F (37 C) (Oral)   Resp 18   Ht 5' (1.524 m)   Wt 166 lb 3.2 oz (75.4 kg)   SpO2 98%   BMI 32.46 kg/m    No facility-administered encounter medications on file as of 02/26/2020.   Outpatient Encounter Medications as of 02/26/2020  Medication Sig  . acetaminophen (TYLENOL) 650 MG CR tablet Take 650 mg by mouth every 8 (eight) hours as needed for pain.  Marland Kitchen amLODipine (NORVASC) 10 MG tablet Take 1 tablet (10 mg total) by mouth daily.  Marland Kitchen aspirin 81 MG chewable tablet Chew 81 mg by mouth daily.  Roseanne Kaufman Peru-Castor Oil (VENELEX) OINT Apply topically. apply to residents buttocks q shift PRN Every Shift - PRN  . DULoxetine (CYMBALTA) 60 MG capsule Take 60 mg by mouth daily.   . Emollient (EUCERIN) lotion Apply topically 4 (four) times daily as needed for dry skin.  Marland Kitchen lamoTRIgine (LAMICTAL) 25 MG tablet Take 25 mg by mouth 2 (two) times daily.   Marland Kitchen linagliptin (TRADJENTA) 5 MG TABS tablet Take 5 mg by mouth daily.  Marland Kitchen loratadine (CLARITIN) 10 MG tablet Take 10 mg by mouth daily.  . Melatonin 5 MG TABS Take 5 mg by mouth at bedtime.  . Menthol, Topical Analgesic, (BIOFREEZE ROLL-ON COLORLESS) 4 % GEL Apply 1 application topically daily as needed (FOR PAIN).   Marland Kitchen metFORMIN (GLUCOPHAGE) 1000 MG tablet Take 1,000 mg by mouth 2 (two) times daily with a meal.   . montelukast (SINGULAIR) 10 MG tablet Take 1 tablet (10 mg total) by mouth at bedtime.  . NON FORMULARY Diet:  Consistent Carbohydrate,  . pantoprazole (PROTONIX) 40 MG tablet Take 40 mg by mouth every morning.  . polyethylene glycol (MIRALAX / GLYCOLAX) packet Take 17 g by mouth daily.  Marland Kitchen senna-docusate (SENOKOT-S) 8.6-50 MG tablet Take 1 tablet by mouth at bedtime.   . simvastatin (ZOCOR) 10 MG tablet Take 10 mg by mouth at bedtime.   . Sodium Fluoride (PREVIDENT 5000 BOOSTER) 1.1 % PSTE Place 1 application onto teeth at bedtime.   . traMADol (ULTRAM) 50 MG tablet Take 1 tablet (50 mg total) by mouth 2 (two) times daily.  Marland Kitchen triamcinolone (NASACORT ALLERGY 24HR) 55 MCG/ACT AERO nasal inhaler Place 1 spray into the nose daily.  . valACYclovir  (VALTREX) 500 MG tablet Take 500 mg by mouth daily.   . [DISCONTINUED] gabapentin (NEURONTIN) 300 MG capsule Take 300 mg by mouth 2 (two) times daily. Reported on 02/03/2016     SIGNIFICANT DIAGNOSTIC EXAMS       ASSESSMENT/ PLAN:    MD is aware of resident's narcotic use and is in agreement with current plan of care. We will attempt to wean resident as appropriate.  Ok Edwards  NP [facility]  Contact [phone] Monday through Friday 8am- 5pm  After hours call [phone]

## 2020-03-02 ENCOUNTER — Other Ambulatory Visit: Payer: Self-pay | Admitting: Adult Health

## 2020-03-02 MED ORDER — TRAMADOL HCL 50 MG PO TABS: 50.0000 mg | ORAL_TABLET | Freq: Two times a day (BID) | ORAL | 0 refills | Status: DC

## 2020-03-03 NOTE — Progress Notes (Signed)
This encounter was created in error - please disregard.

## 2020-03-06 ENCOUNTER — Non-Acute Institutional Stay (SKILLED_NURSING_FACILITY): Payer: Medicare Other | Admitting: Adult Health

## 2020-03-06 ENCOUNTER — Encounter: Payer: Self-pay | Admitting: Adult Health

## 2020-03-06 DIAGNOSIS — E119 Type 2 diabetes mellitus without complications: Secondary | ICD-10-CM | POA: Diagnosis not present

## 2020-03-06 DIAGNOSIS — G8929 Other chronic pain: Secondary | ICD-10-CM

## 2020-03-06 DIAGNOSIS — E1159 Type 2 diabetes mellitus with other circulatory complications: Secondary | ICD-10-CM | POA: Diagnosis not present

## 2020-03-06 DIAGNOSIS — R52 Pain, unspecified: Secondary | ICD-10-CM

## 2020-03-06 DIAGNOSIS — F339 Major depressive disorder, recurrent, unspecified: Secondary | ICD-10-CM

## 2020-03-06 DIAGNOSIS — I10 Essential (primary) hypertension: Secondary | ICD-10-CM

## 2020-03-06 DIAGNOSIS — Z0000 Encounter for general adult medical examination without abnormal findings: Secondary | ICD-10-CM

## 2020-03-06 NOTE — Progress Notes (Signed)
Subjective:   [name] [name] is a 79 y.o. female who presents for Medicare Annual (Subsequent) preventive examination. Long term resident of [facility]  Review of Systems:  Review of Systems  Constitutional: Negative for malaise/fatigue.  Respiratory: Negative for cough and shortness of breath.   Cardiovascular: Negative for chest pain, palpitations and leg swelling.  Gastrointestinal: Negative for abdominal pain, constipation and heartburn.  Musculoskeletal: Negative for back pain, joint pain and myalgias.  Skin: Negative.   Neurological: Negative for dizziness.  Psychiatric/Behavioral: The patient is not nervous/anxious.     Cardiac Risk Factors include: advanced age (>62men, >43 women);diabetes mellitus;sedentary lifestyle;obesity (BMI >30kg/m2)     Objective:     Vitals: BP 128/78   Pulse 70   Temp (!) 97.3 F (36.3 C)   Resp 18   Ht 5' (1.524 m)   Wt 166 lb (75.3 kg)   BMI 32.42 kg/m   Body mass index is 32.42 kg/m.  Advanced Directives 02/26/2020 02/20/2020 01/24/2020 01/23/2020 01/14/2020 01/01/2020 12/26/2019  Does Patient Have a Medical Advance Directive? Yes Yes No No No No No  Type of Advance Directive (No Data) (No Data) - - - - (No Data)  Does patient want to make changes to medical advance directive? No - Patient declined No - Patient declined No - Patient declined No - Patient declined No - Patient declined No - Patient declined No - Patient declined  Copy of Healthcare Power of Attorney in Chart? - - - - - - -  Would patient like information on creating a medical advance directive? - - - - - - -    Tobacco Social History   Tobacco Use  Smoking Status Never Smoker  Smokeless Tobacco Current User  . Types: Snuff     Ready to quit: Not Answered Counseling given: Not Answered   Clinical Intake:  Pre-visit preparation completed: Yes  Pain : No/denies pain     BMI - recorded: 32.42 Nutritional Status: BMI > 30  Obese Nutritional Risks: None Diabetes:  Yes CBG done?: (per nursing staff)  How often do you need to have someone help you when you read instructions, pamphlets, or other written materials from your doctor or pharmacy?: 3 - Sometimes  Interpreter Needed?: No     Past Medical History:  Diagnosis Date  . Anxiety   . Bowel obstruction (HCC)   . Burning with urination 01/04/2016  . Diabetes mellitus without complication (HCC)   . Genital herpes   . GERD (gastroesophageal reflux disease)   . Hematuria 01/04/2016  . Herpes 12/22/2015  . Hyperlipidemia   . Hypertension   . LLQ abdominal tenderness 01/04/2016  . Neuropathy   . Pelvic pressure in female 01/04/2016  . Recurrent UTI   . Sciatic nerve disease   . Small bowel obstruction (HCC) 04/27/2016  . Urinary frequency 01/04/2016  . Urticaria    Past Surgical History:  Procedure Laterality Date  . ABDOMINAL HYSTERECTOMY    . APPLICATION OF INTRAOPERATIVE CT SCAN N/A 12/19/2018   Procedure: APPLICATION OF INTRAOPERATIVE CT SCAN;  Surgeon: Maeola Harman, MD;  Location: [facility] OR;  Service: Neurosurgery;  Laterality: N/A;  . bowel obstruction    . COLON SURGERY     blocked colon  . POSTERIOR CERVICAL FUSION/FORAMINOTOMY N/A 12/19/2018   Procedure: Cervical Seven to Thoracic One Posterior cervical fusion;  Surgeon: Maeola Harman, MD;  Location: [facility] OR;  Service: Neurosurgery;  Laterality: N/A;  Cervical Seven to Thoracic One Posterior cervical fusion   Family  History  Problem Relation Age of Onset  . Diabetes Mother   . Heart disease Brother   . Hypertension Brother   . Diabetes Brother   . Hypertension Daughter   . Diabetes Brother   . Diabetes Brother   . Alzheimer's disease Brother   . Anxiety disorder Daughter    Social History   Socioeconomic History  . Marital status: Single    Spouse name: Not on file  . Number of children: Not on file  . Years of education: Not on file  . Highest education level: Not on file  Occupational History  . Not on file  Tobacco Use  .  Smoking status: Never Smoker  . Smokeless tobacco: Current User    Types: Snuff  Substance and Sexual Activity  . Alcohol use: No  . Drug use: No  . Sexual activity: Not Currently    Birth control/protection: Surgical    Comment: hyst  Other Topics Concern  . Not on file  Social History Narrative  . Not on file   Social Determinants of Health   Financial Resource Strain:   . Difficulty of Paying Living Expenses:   Food Insecurity:   . Worried About Programme researcher, broadcasting/film/video in the Last Year:   . Barista in the Last Year:   Transportation Needs:   . Freight forwarder (Medical):   Marland Kitchen Lack of Transportation (Non-Medical):   Physical Activity:   . Days of Exercise per Week:   . Minutes of Exercise per Session:   Stress:   . Feeling of Stress :   Social Connections:   . Frequency of Communication with Friends and Family:   . Frequency of Social Gatherings with Friends and Family:   . Attends Religious Services:   . Active Member of Clubs or Organizations:   . Attends Banker Meetings:   Marland Kitchen Marital Status:     No facility-administered encounter medications on file as of 03/06/2020.   Outpatient Encounter Medications as of 03/06/2020  Medication Sig  . acetaminophen (TYLENOL) 650 MG CR tablet Take 650 mg by mouth every 8 (eight) hours as needed for pain.  Marland Kitchen amLODipine (NORVASC) 10 MG tablet Take 1 tablet (10 mg total) by mouth daily.  Marland Kitchen aspirin 81 MG chewable tablet Chew 81 mg by mouth daily.  Lucilla Lame [place]-Castor Oil (VENELEX) OINT Apply topically. apply to residents buttocks q shift PRN Every Shift - PRN  . DULoxetine (CYMBALTA) 60 MG capsule Take 60 mg by mouth daily.   . Emollient (EUCERIN) lotion Apply topically 4 (four) times daily as needed for dry skin.  Marland Kitchen lamoTRIgine (LAMICTAL) 25 MG tablet Take 25 mg by mouth 2 (two) times daily.   Marland Kitchen linagliptin (TRADJENTA) 5 MG TABS tablet Take 5 mg by mouth daily.  Marland Kitchen loratadine (CLARITIN) 10 MG tablet Take 10 mg  by mouth daily.  . Melatonin 5 MG TABS Take 5 mg by mouth at bedtime.  . Menthol, Topical Analgesic, (BIOFREEZE ROLL-ON COLORLESS) 4 % GEL Apply 1 application topically daily as needed (FOR PAIN).   Marland Kitchen metFORMIN (GLUCOPHAGE) 1000 MG tablet Take 1,000 mg by mouth 2 (two) times daily with a meal.   . montelukast (SINGULAIR) 10 MG tablet Take 1 tablet (10 mg total) by mouth at bedtime.  . NON FORMULARY Diet:  Consistent Carbohydrate,  . pantoprazole (PROTONIX) 40 MG tablet Take 40 mg by mouth every morning.  . polyethylene glycol (MIRALAX / GLYCOLAX) packet Take 17 g  by mouth daily.  Marland Kitchen senna-docusate (SENOKOT-S) 8.6-50 MG tablet Take 1 tablet by mouth at bedtime.   . simvastatin (ZOCOR) 10 MG tablet Take 10 mg by mouth at bedtime.   . Sodium Fluoride (PREVIDENT 5000 BOOSTER) 1.1 % PSTE Place 1 application onto teeth at bedtime.   . traMADol (ULTRAM) 50 MG tablet Take 1 tablet (50 mg total) by mouth 2 (two) times daily.  Marland Kitchen triamcinolone (NASACORT ALLERGY 24HR) 55 MCG/ACT AERO nasal inhaler Place 1 spray into the nose daily.  . valACYclovir (VALTREX) 500 MG tablet Take 500 mg by mouth daily.   . [DISCONTINUED] gabapentin (NEURONTIN) 300 MG capsule Take 300 mg by mouth 2 (two) times daily. Reported on 02/03/2016    Activities of Daily Living In your present state of health, do you have any difficulty performing the following activities: 03/06/2020  Hearing? N  Vision? N  Difficulty concentrating or making decisions? Y  Walking or climbing stairs? Y  Dressing or bathing? Y  Doing errands, shopping? Y  Preparing Food and eating ? Y  Using the Toilet? Y  In the past six months, have you accidently leaked urine? Y  Do you have problems with loss of bowel control? Y  Managing your Medications? Y  Managing your Finances? Y  Some recent data might be hidden    Patient Care Team: Avon Gully, MD as PCP - General (Internal Medicine)    Assessment:   This is a routine wellness examination for  [place].  Exercise Activities and Dietary recommendations Current Exercise Habits: The patient does not participate in regular exercise at present  Goals    . Follow up with Primary Care Provider       Fall Risk Fall Risk  03/06/2020 07/09/2019  Falls in the past year? 0 0  Number falls in past yr: - 0  Injury with Fall? - 0  Risk for fall due to : Impaired balance/gait;Impaired mobility History of fall(s);Impaired balance/gait;Impaired mobility;Mental status change  Follow up - Falls evaluation completed   Is the patient's home free of loose throw rugs in walkways, pet beds, electrical cords, etc?   yes      Grab bars in the bathroom? yes      Handrails on the stairs?   n/a      Adequate lighting?   yes  Timed Get Up and Go performed: unable to participate   Depression Screen PHQ 2/9 Scores 03/06/2020  PHQ - 2 Score 0     Cognitive Function     6CIT Screen 03/06/2020  What Year? 0 points  What month? 0 points  What time? 3 points  Count back from 20 2 points  Months in reverse 2 points  Repeat phrase 2 points  Total Score 9    Immunization History  Administered Date(s) Administered  . Influenza-Unspecified 09/16/2019    Qualifies for Shingles Vaccine?n/a  Screening Tests Health Maintenance  Topic Date Due  . FOOT EXAM  Never done  . OPHTHALMOLOGY EXAM  Never done  . URINE MICROALBUMIN  Never done  . TETANUS/TDAP  Never done  . DEXA SCAN  Never done  . PNA vac Low Risk Adult (1 of 2 - PCV13) Never done  . HEMOGLOBIN A1C  08/26/2020  . INFLUENZA VACCINE  Completed    Cancer Screenings: Lung: Low Dose CT Chest recommended if Age 28-80 years, 30 pack-year currently smoking OR have quit w/in 15years. Patient does not  qualify. Breast:  Up to date on Mammogram? n/a  Up to date of Bone Density/Dexa? n/a Colorectal: n/a  Additional Screenings: n/a: Hepatitis C Screening:      Plan:     I have personally reviewed and noted the following in the  patient's chart:   . Medical and social history . Use of alcohol, tobacco or illicit drugs  . Current medications and supplements . Functional ability and status . Nutritional status . Physical activity . Advanced directives . List of other physicians . Hospitalizations, surgeries, and ER visits in previous 12 months . Vitals . Screenings to include cognitive, depression, and falls . Referrals and appointments  In addition, I have reviewed and discussed with patient certain preventive protocols, quality metrics, and best practice recommendations. A written personalized care plan for preventive services as well as general preventive health recommendations were provided to patient.     Gerlene Fee, NP  03/06/2020

## 2020-03-06 NOTE — Patient Instructions (Signed)
  Ms. Minichiello , Thank you for taking time to come for your Medicare Wellness Visit. I appreciate your ongoing commitment to your health goals. Please review the following plan we discussed and let me know if I can assist you in the future.   These are the goals we discussed: Goals    . Follow up with Primary Care Provider       This is a list of the screening recommended for you and due dates:  Health Maintenance  Topic Date Due  . Complete foot exam   Never done  . Eye exam for diabetics  Never done  . Urine Protein Check  Never done  . Tetanus Vaccine  Never done  . DEXA scan (bone density measurement)  Never done  . Pneumonia vaccines (1 of 2 - PCV13) Never done  . Hemoglobin A1C  08/26/2020  . Flu Shot  Completed

## 2020-03-19 ENCOUNTER — Encounter: Payer: Self-pay | Admitting: Adult Health

## 2020-03-23 ENCOUNTER — Non-Acute Institutional Stay (SKILLED_NURSING_FACILITY): Payer: Medicare Other | Admitting: Adult Health

## 2020-03-23 ENCOUNTER — Encounter: Payer: Self-pay | Admitting: Adult Health

## 2020-03-23 DIAGNOSIS — K5909 Other constipation: Secondary | ICD-10-CM | POA: Diagnosis not present

## 2020-03-23 DIAGNOSIS — B009 Herpesviral infection, unspecified: Secondary | ICD-10-CM

## 2020-03-23 DIAGNOSIS — J3089 Other allergic rhinitis: Secondary | ICD-10-CM

## 2020-03-23 NOTE — Progress Notes (Signed)
Location:    [facility] Room Number: 110/W Place of Service:  SNF (31)   CODE STATUS: Full Code  Allergies  Allergen Reactions  . Lisinopril   . Penicillins Nausea Only    Has patient had a PCN reaction causing immediate rash, facial/tongue/throat swelling, SOB or lightheadedness with hypotension: Yes Has patient had a PCN reaction causing severe rash involving mucus membranes or skin necrosis: No Has patient had a PCN reaction that required hospitalization No Has patient had a PCN reaction occurring within the last 10 years: No If all of the above answers are "NO", then may proceed with Cephalosporin use.   . Sulfa Antibiotics Nausea And Vomiting   Chief Complaint  Patient presents with  . Medical Management of Chronic Issues         Chronic non-seasonal allergic rhinitis:    Herpes:    Chronic constipation      HPI:  She is a 79 year old long [facility] of [facility] being seen for the management of her chronic illnesses: allergic rhinitis; herpes; constipation. There are no reports of congestion; constipation; uncontrolled pain or changes of appetite.   Past Medical History:  Diagnosis Date  . Anxiety   . Bowel obstruction (HCC)   . Burning with urination 01/04/2016  . Diabetes mellitus without complication (HCC)   . Genital herpes   . GERD (gastroesophageal reflux disease)   . Hematuria 01/04/2016  . Herpes 12/22/2015  . Hyperlipidemia   . Hypertension   . LLQ abdominal tenderness 01/04/2016  . Neuropathy   . Pelvic pressure in female 01/04/2016  . Recurrent UTI   . Sciatic nerve disease   . Small bowel obstruction (HCC) 04/27/2016  . Urinary frequency 01/04/2016  . Urticaria     Past Surgical History:  Procedure Laterality Date  . ABDOMINAL HYSTERECTOMY    . APPLICATION OF INTRAOPERATIVE CT SCAN N/A 12/19/2018   Procedure: APPLICATION OF INTRAOPERATIVE CT SCAN;  Surgeon: Maeola Harman, MD;  Location: [facility] OR;  Service: Neurosurgery;   Laterality: N/A;  . bowel obstruction    . COLON SURGERY     blocked colon  . POSTERIOR CERVICAL FUSION/FORAMINOTOMY N/A 12/19/2018   Procedure: Cervical Seven to Thoracic One Posterior cervical fusion;  Surgeon: Maeola Harman, MD;  Location: [facility] OR;  Service: Neurosurgery;  Laterality: N/A;  Cervical Seven to Thoracic One Posterior cervical fusion    Social History   Socioeconomic History  . Marital status: Single    Spouse name: Not on file  . Number of children: Not on file  . Years of education: Not on file  . Highest education level: Not on file  Occupational History  . Not on file  Tobacco Use  . Smoking status: Never Smoker  . Smokeless tobacco: Current User    Types: Snuff  Substance and Sexual Activity  . Alcohol use: No  . Drug use: No  . Sexual activity: Not Currently    Birth control/protection: Surgical    Comment: hyst  Other Topics Concern  . Not on file  Social History Narrative  . Not on file   Social Determinants of Health   Financial Resource Strain:   . Difficulty of Paying Living Expenses:   Food Insecurity:   . Worried About Programme researcher, broadcasting/film/video in the Last Year:   . Barista in the Last Year:   Transportation Needs:   . Freight forwarder (Medical):   Marland Kitchen Lack of Transportation (Non-Medical):  Physical Activity:   . Days of Exercise per Week:   . Minutes of Exercise per Session:   Stress:   . Feeling of Stress :   Social Connections:   . Frequency of Communication with Friends and Family:   . Frequency of Social Gatherings with Friends and Family:   . Attends Religious Services:   . Active Member of Clubs or Organizations:   . Attends Archivist Meetings:   Marland Kitchen Marital Status:   Intimate Partner Violence:   . Fear of Current or Ex-Partner:   . Emotionally Abused:   Marland Kitchen Physically Abused:   . Sexually Abused:    Family History  Problem Relation Age of Onset  . Diabetes Mother   . Heart disease Brother   . Hypertension  Brother   . Diabetes Brother   . Hypertension Daughter   . Diabetes Brother   . Diabetes Brother   . Alzheimer's disease Brother   . Anxiety disorder Daughter       VITAL SIGNS BP 124/60   Pulse 76   Temp (!) 97.5 F (36.4 C) (Oral)   Resp 20   Ht 5' (1.524 m)   Wt 171 lb 6.4 oz (77.7 kg)   SpO2 98%   BMI 33.47 kg/m   No facility-administered encounter medications on file as of 03/23/2020.   Outpatient Encounter Medications as of 03/23/2020  Medication Sig  . acetaminophen (TYLENOL) 650 MG CR tablet Take 650 mg by mouth every 8 (eight) hours as needed for pain.  Marland Kitchen amLODipine (NORVASC) 10 MG tablet Take 1 tablet (10 mg total) by mouth daily.  Marland Kitchen aspirin 81 MG chewable tablet Chew 81 mg by mouth daily.  Roseanne Kaufman Peru-Castor Oil (VENELEX) OINT Apply topically. apply to residents buttocks q shift PRN Every Shift - PRN  . DULoxetine (CYMBALTA) 60 MG capsule Take 60 mg by mouth daily.   . Emollient (EUCERIN) lotion Apply topically 4 (four) times daily as needed for dry skin.  Marland Kitchen lamoTRIgine (LAMICTAL) 25 MG tablet Take 25 mg by mouth 2 (two) times daily.   Marland Kitchen linagliptin (TRADJENTA) 5 MG TABS tablet Take 5 mg by mouth daily.  Marland Kitchen loratadine (CLARITIN) 10 MG tablet Take 10 mg by mouth daily.  . Melatonin 5 MG TABS Take 5 mg by mouth at bedtime.  . Menthol, Topical Analgesic, (BIOFREEZE ROLL-ON COLORLESS) 4 % GEL Apply 1 application topically daily as needed (FOR PAIN).   Marland Kitchen metFORMIN (GLUCOPHAGE) 1000 MG tablet Take 1,000 mg by mouth 2 (two) times daily with a meal.   . montelukast (SINGULAIR) 10 MG tablet Take 1 tablet (10 mg total) by mouth at bedtime.  . NON FORMULARY Diet:  Consistent Carbohydrate,  . pantoprazole (PROTONIX) 40 MG tablet Take 40 mg by mouth every morning.  . polyethylene glycol (MIRALAX / GLYCOLAX) packet Take 17 g by mouth daily.  Marland Kitchen senna-docusate (SENOKOT-S) 8.6-50 MG tablet Take 1 tablet by mouth at bedtime.   . simvastatin (ZOCOR) 10 MG tablet Take 10 mg by  mouth at bedtime.   . Sodium Fluoride (PREVIDENT 5000 BOOSTER) 1.1 % PSTE Place 1 application onto teeth at bedtime.   . traMADol (ULTRAM) 50 MG tablet Take 1 tablet (50 mg total) by mouth 2 (two) times daily.  Marland Kitchen triamcinolone (NASACORT ALLERGY 24HR) 55 MCG/ACT AERO nasal inhaler Place 1 spray into the nose daily.  . valACYclovir (VALTREX) 500 MG tablet Take 500 mg by mouth daily.   . [DISCONTINUED] gabapentin (NEURONTIN) 300 MG capsule  Take 300 mg by mouth 2 (two) times daily. Reported on 02/03/2016     SIGNIFICANT DIAGNOSTIC EXAMS  PREVIOUS  12-18-19: chest x-ray: Mild right upper lobe and bibasilar airspace opacities which could reflect early pneumonia (including atypical/viral infection).  12-17-18; ct of abdomen and pelvis: Slight edema of the mucosa of the distal antrum of the stomach. This could represent gastritis. Otherwise, benign-appearing abdomen and pelvis.  NO NEW EXAMS.   LABS REVIEWED PREVIOUS;   12-16-19: urine culture: citrobacter youngae 12-18-19: wbc 16.6; hgb 14.7; hct 45.8; mcv 87.1 plt 330; glucose 168; bun 23; creat 0.85; k+ 3.7; na++ 136; ca 9.7; liver normal albumin 4.4 12-20-19: wbc 12.9; hgb 14.8; hct 48.2 ;mcv 90.4 plt 296 glucose 152; bun 33; creat 1.03 ;k+ 3.8; na++ 139; ca 9.7  01-02-20: wbc 14.5; hgb 12.9; hct 40.8 mcv 89.9 plt 171; glucose 128; bun 10; creat 0.80 ;k+ 4.3; na++ 138; ca 9.3 urine culture: multiple species.   NO NEW LABS.    Review of Systems  Constitutional: Negative for malaise/fatigue.  Respiratory: Negative for cough and shortness of breath.   Cardiovascular: Negative for chest pain, palpitations and leg swelling.  Gastrointestinal: Negative for abdominal pain, constipation and heartburn.  Musculoskeletal: Negative for back pain, joint pain and myalgias.  Skin: Negative.   Neurological: Negative for dizziness.  Psychiatric/Behavioral: The patient is not nervous/anxious.      Physical Exam Constitutional:      General: She is not in  acute distress.    Appearance: She is well-developed. She is not diaphoretic.  Neck:     Thyroid: No thyromegaly.  Cardiovascular:     Rate and Rhythm: Normal rate and regular rhythm.     Pulses: Normal pulses.     Heart sounds: Normal heart sounds.  Pulmonary:     Effort: Pulmonary effort is normal. No respiratory distress.     Breath sounds: Normal breath sounds.  Abdominal:     General: Bowel sounds are normal. There is no distension.     Palpations: Abdomen is soft.     Tenderness: There is no abdominal tenderness.  Musculoskeletal:        General: Normal range of motion.     Cervical back: Neck supple.     Right lower leg: No edema.     Left lower leg: No edema.  Lymphadenopathy:     Cervical: No cervical adenopathy.  Skin:    General: Skin is warm and dry.  Neurological:     Mental Status: She is alert. Mental status is at baseline.  Psychiatric:        Mood and Affect: Mood normal.      ASSESSMENT/ PLAN:  TODAY  1. Chronic non-seasonal allergic rhinitis: is stable will continue nasocort daily; claritin 10 mg daily singulair 10 mg daily   2. Herpes: is stable no recent outbreak will continue valtrex 500 mg daily   3. Chronic constipation: is stable will continue miralax daily and senna s daily    PREVIOUS     7. Major depression recurrent chronic: is stable will continue cymbalta 60 mg daily lamictal 50 mg twice daily for mood and melatonin 5 mg nightly   8. Hypertension associated with type 2 diabetes mellitus: is stable b/p 124/60 will continue norvasc 10 mg daily asa 81 mg daily   1. Dyslipidemia associated with type 2 diabetes mellitus: is stable will continue zocor 10 mg daily   2. Non-insulin dependent type 2 diabetes mellitus: is stable will continue metformin  1 gm twice daily tradjenta 5 mg daily is on statin and asa   3. Gastroesophageal reflux disease without esophagitis: Is stable will continue protonix 40 mg daily           MD is aware  of resident's narcotic use and is in agreement with current plan of care. We will attempt to wean resident as appropriate.  Synthia Innocent NP [facility]  Contact [phone] Monday through Friday 8am- 5pm  After hours call [phone]

## 2020-03-30 ENCOUNTER — Other Ambulatory Visit: Payer: Self-pay | Admitting: Adult Health

## 2020-03-30 MED ORDER — TRAMADOL HCL 50 MG PO TABS: 50.0000 mg | ORAL_TABLET | Freq: Two times a day (BID) | ORAL | 0 refills | Status: DC

## 2020-04-02 ENCOUNTER — Encounter (HOSPITAL_COMMUNITY)
Admission: RE | Admit: 2020-04-02 | Discharge: 2020-04-02 | Disposition: A | Discharge status: Home / Self Care | Payer: Medicare Other | Source: Skilled Nursing Facility | Attending: Adult Health | Admitting: Adult Health

## 2020-04-02 ENCOUNTER — Non-Acute Institutional Stay (SKILLED_NURSING_FACILITY): Payer: Medicare Other | Admitting: Adult Health

## 2020-04-02 ENCOUNTER — Encounter: Payer: Self-pay | Admitting: Adult Health

## 2020-04-02 DIAGNOSIS — J3089 Other allergic rhinitis: Secondary | ICD-10-CM | POA: Insufficient documentation

## 2020-04-02 DIAGNOSIS — Z8616 Personal history of COVID-19: Secondary | ICD-10-CM | POA: Diagnosis present

## 2020-04-02 DIAGNOSIS — E119 Type 2 diabetes mellitus without complications: Secondary | ICD-10-CM

## 2020-04-02 DIAGNOSIS — R531 Weakness: Secondary | ICD-10-CM | POA: Insufficient documentation

## 2020-04-02 DIAGNOSIS — E1159 Type 2 diabetes mellitus with other circulatory complications: Secondary | ICD-10-CM | POA: Diagnosis not present

## 2020-04-02 DIAGNOSIS — F339 Major depressive disorder, recurrent, unspecified: Secondary | ICD-10-CM | POA: Diagnosis present

## 2020-04-02 DIAGNOSIS — F0390 Unspecified dementia without behavioral disturbance: Secondary | ICD-10-CM | POA: Diagnosis present

## 2020-04-02 DIAGNOSIS — A609 Anogenital herpesviral infection, unspecified: Secondary | ICD-10-CM | POA: Diagnosis present

## 2020-04-02 DIAGNOSIS — I10 Essential (primary) hypertension: Secondary | ICD-10-CM

## 2020-04-02 NOTE — Progress Notes (Signed)
Location:    [facility] Room Number: 110/W Place of Service:  SNF (31)   CODE STATUS: Full Code  Allergies  Allergen Reactions  . Lisinopril   . Penicillins Nausea Only    Has patient had a PCN reaction causing immediate rash, facial/tongue/throat swelling, SOB or lightheadedness with hypotension: Yes Has patient had a PCN reaction causing severe rash involving mucus membranes or skin necrosis: No Has patient had a PCN reaction that required hospitalization No Has patient had a PCN reaction occurring within the last 10 years: No If all of the above answers are "NO", then may proceed with Cephalosporin use.   . Sulfa Antibiotics Nausea And Vomiting    Chief Complaint  Patient presents with  . Acute Visit    Care Plan Meeting    HPI:  We have come together for her care plan meeting. BIMS 13/15 mood 6/30. She has had 2 falls with one skin tear. Her weight is table since her admission she has gained 16 pounds. She is complaining of pins and needles in her feet. She is independent with adls with supervision. She is continent of bowel and bladder. She continues to be followed for her chronic illnesses including: diabetes; hypertension; depression.   Past Medical History:  Diagnosis Date  . Anxiety   . Bowel obstruction (HCC)   . Burning with urination 01/04/2016  . Diabetes mellitus without complication (HCC)   . Genital herpes   . GERD (gastroesophageal reflux disease)   . Hematuria 01/04/2016  . Herpes 12/22/2015  . Hyperlipidemia   . Hypertension   . LLQ abdominal tenderness 01/04/2016  . Neuropathy   . Pelvic pressure in female 01/04/2016  . Recurrent UTI   . Sciatic nerve disease   . Small bowel obstruction (HCC) 04/27/2016  . Urinary frequency 01/04/2016  . Urticaria     Past Surgical History:  Procedure Laterality Date  . ABDOMINAL HYSTERECTOMY    . APPLICATION OF INTRAOPERATIVE CT SCAN N/A 12/19/2018   Procedure: APPLICATION OF INTRAOPERATIVE  CT SCAN;  Surgeon: Maeola Harman, MD;  Location: [facility] OR;  Service: Neurosurgery;  Laterality: N/A;  . bowel obstruction    . COLON SURGERY     blocked colon  . POSTERIOR CERVICAL FUSION/FORAMINOTOMY N/A 12/19/2018   Procedure: Cervical Seven to Thoracic One Posterior cervical fusion;  Surgeon: Maeola Harman, MD;  Location: [facility] OR;  Service: Neurosurgery;  Laterality: N/A;  Cervical Seven to Thoracic One Posterior cervical fusion    Social History   Socioeconomic History  . Marital status: Single    Spouse name: Not on file  . Number of children: Not on file  . Years of education: Not on file  . Highest education level: Not on file  Occupational History  . Not on file  Tobacco Use  . Smoking status: Never Smoker  . Smokeless tobacco: Current User    Types: Snuff  Substance and Sexual Activity  . Alcohol use: No  . Drug use: No  . Sexual activity: Not Currently    Birth control/protection: Surgical    Comment: hyst  Other Topics Concern  . Not on file  Social History Narrative  . Not on file   Social Determinants of Health   Financial Resource Strain:   . Difficulty of Paying Living Expenses:   Food Insecurity:   . Worried About Programme researcher, broadcasting/film/video in the Last Year:   . Barista in the Last Year:   Transportation Needs:   .  Lack of Transportation (Medical):   Marland Kitchen Lack of Transportation (Non-Medical):   Physical Activity:   . Days of Exercise per Week:   . Minutes of Exercise per Session:   Stress:   . Feeling of Stress :   Social Connections:   . Frequency of Communication with Friends and Family:   . Frequency of Social Gatherings with Friends and Family:   . Attends Religious Services:   . Active Member of Clubs or Organizations:   . Attends Archivist Meetings:   Marland Kitchen Marital Status:   Intimate Partner Violence:   . Fear of Current or Ex-Partner:   . Emotionally Abused:   Marland Kitchen Physically Abused:   . Sexually Abused:    Family History  Problem  Relation Age of Onset  . Diabetes Mother   . Heart disease Brother   . Hypertension Brother   . Diabetes Brother   . Hypertension Daughter   . Diabetes Brother   . Diabetes Brother   . Alzheimer's disease Brother   . Anxiety disorder Daughter       VITAL SIGNS BP (!) 148/75   Pulse 69   Temp 97.7 F (36.5 C) (Oral)   Resp 20   Ht 5' (1.524 m)   Wt 171 lb 6.4 oz (77.7 kg)   SpO2 98%   BMI 33.47 kg/m   No facility-administered encounter medications on file as of 04/02/2020.   Outpatient Encounter Medications as of 04/02/2020  Medication Sig  . acetaminophen (TYLENOL) 650 MG CR tablet Take 650 mg by mouth every 8 (eight) hours as needed for pain.  Marland Kitchen amLODipine (NORVASC) 10 MG tablet Take 1 tablet (10 mg total) by mouth daily.  Marland Kitchen aspirin 81 MG chewable tablet Chew 81 mg by mouth daily.  Roseanne Kaufman Peru-Castor Oil (VENELEX) OINT Apply topically. apply to residents buttocks q shift PRN Every Shift - PRN  . DULoxetine (CYMBALTA) 60 MG capsule Take 60 mg by mouth daily.   . Emollient (EUCERIN) lotion Apply topically 4 (four) times daily as needed for dry skin.  Marland Kitchen lamoTRIgine (LAMICTAL) 25 MG tablet Take 25 mg by mouth 2 (two) times daily.   Marland Kitchen linagliptin (TRADJENTA) 5 MG TABS tablet Take 5 mg by mouth daily.  Marland Kitchen loratadine (CLARITIN) 10 MG tablet Take 10 mg by mouth daily.  . Melatonin 5 MG TABS Take 5 mg by mouth at bedtime.  . Menthol, Topical Analgesic, (BIOFREEZE ROLL-ON COLORLESS) 4 % GEL Apply 1 application topically daily as needed (FOR PAIN).   Marland Kitchen metFORMIN (GLUCOPHAGE) 1000 MG tablet Take 1,000 mg by mouth 2 (two) times daily with a meal.   . montelukast (SINGULAIR) 10 MG tablet Take 1 tablet (10 mg total) by mouth at bedtime.  . NON FORMULARY Diet:  Consistent Carbohydrate,  . pantoprazole (PROTONIX) 40 MG tablet Take 40 mg by mouth every morning.  . polyethylene glycol (MIRALAX / GLYCOLAX) packet Take 17 g by mouth daily.  Marland Kitchen senna-docusate (SENOKOT-S) 8.6-50 MG tablet  Take 1 tablet by mouth at bedtime.   . simvastatin (ZOCOR) 10 MG tablet Take 10 mg by mouth at bedtime.   . Sodium Fluoride (PREVIDENT 5000 BOOSTER) 1.1 % PSTE Place 1 application onto teeth at bedtime.   . traMADol (ULTRAM) 50 MG tablet Take 1 tablet (50 mg total) by mouth 2 (two) times daily.  Marland Kitchen triamcinolone (NASACORT ALLERGY 24HR) 55 MCG/ACT AERO nasal inhaler Place 1 spray into the nose daily.  . valACYclovir (VALTREX) 500 MG tablet Take 500  mg by mouth daily.   . [DISCONTINUED] gabapentin (NEURONTIN) 300 MG capsule Take 300 mg by mouth 2 (two) times daily. Reported on 02/03/2016     SIGNIFICANT DIAGNOSTIC EXAMS   PREVIOUS  12-18-19: chest x-ray: Mild right upper lobe and bibasilar airspace opacities which could reflect early pneumonia (including atypical/viral infection).  12-17-18; ct of abdomen and pelvis: Slight edema of the mucosa of the distal antrum of the stomach. This could represent gastritis. Otherwise, benign-appearing abdomen and pelvis.  NO NEW EXAMS.   LABS REVIEWED PREVIOUS;   12-16-19: urine culture: citrobacter youngae 12-18-19: wbc 16.6; hgb 14.7; hct 45.8; mcv 87.1 plt 330; glucose 168; bun 23; creat 0.85; k+ 3.7; na++ 136; ca 9.7; liver normal albumin 4.4 12-20-19: wbc 12.9; hgb 14.8; hct 48.2 ;mcv 90.4 plt 296 glucose 152; bun 33; creat 1.03 ;k+ 3.8; na++ 139; ca 9.7  01-02-20: wbc 14.5; hgb 12.9; hct 40.8 mcv 89.9 plt 171; glucose 128; bun 10; creat 0.80 ;k+ 4.3; na++ 138; ca 9.3 urine culture: multiple species.   NO NEW LABS.   Review of Systems  Constitutional: Negative for malaise/fatigue.  Respiratory: Negative for cough and shortness of breath.   Cardiovascular: Negative for chest pain, palpitations and leg swelling.  Gastrointestinal: Negative for abdominal pain, constipation and heartburn.  Musculoskeletal: Negative for back pain, joint pain and myalgias.  Skin: Negative.   Neurological: Positive for tingling. Negative for dizziness.    Psychiatric/Behavioral: The patient is not nervous/anxious.     Physical Exam Constitutional:      General: She is not in acute distress.    Appearance: She is well-developed. She is not diaphoretic.  Neck:     Thyroid: No thyromegaly.  Cardiovascular:     Rate and Rhythm: Normal rate and regular rhythm.     Pulses: Normal pulses.     Heart sounds: Normal heart sounds.  Pulmonary:     Effort: Pulmonary effort is normal. No respiratory distress.     Breath sounds: Normal breath sounds.  Abdominal:     General: Bowel sounds are normal. There is no distension.     Palpations: Abdomen is soft.     Tenderness: There is no abdominal tenderness.  Musculoskeletal:        General: Normal range of motion.     Cervical back: Neck supple.     Right lower leg: No edema.     Left lower leg: No edema.  Lymphadenopathy:     Cervical: No cervical adenopathy.  Skin:    General: Skin is warm and dry.  Neurological:     Mental Status: She is alert. Mental status is at baseline.  Psychiatric:        Mood and Affect: Mood normal.      ASSESSMENT/ PLAN:  TODAY  1. Major depression chronic 2. Hypertension associated with type 2 diabetes mellitus 3. Non-insulin dependent type 2 diabetes mellitus  Will being tylenol cr 650 mg nightly  Will continue current plan of care Will continue to monitor her status.     MD is aware of resident's narcotic use and is in agreement with current plan of care. We will attempt to wean resident as appropriate.  Synthia Innocent NP [facility]  Contact [phone] Monday through Friday 8am- 5pm  After hours call [phone]

## 2020-04-03 LAB — MICROALBUMIN, URINE: Microalb, Ur: <3 ug/mL — ABNORMAL HIGH

## 2020-04-22 ENCOUNTER — Other Ambulatory Visit: Payer: Self-pay | Admitting: Adult Health

## 2020-04-22 MED ORDER — TRAMADOL HCL 50 MG PO TABS: 50.0000 mg | ORAL_TABLET | Freq: Two times a day (BID) | ORAL | 0 refills | Status: DC

## 2020-04-24 ENCOUNTER — Non-Acute Institutional Stay (SKILLED_NURSING_FACILITY): Payer: Medicare Other | Admitting: Adult Health

## 2020-04-24 ENCOUNTER — Encounter: Payer: Self-pay | Admitting: Adult Health

## 2020-04-24 DIAGNOSIS — E785 Hyperlipidemia, unspecified: Secondary | ICD-10-CM | POA: Diagnosis not present

## 2020-04-24 DIAGNOSIS — E1159 Type 2 diabetes mellitus with other circulatory complications: Secondary | ICD-10-CM

## 2020-04-24 DIAGNOSIS — E1169 Type 2 diabetes mellitus with other specified complication: Secondary | ICD-10-CM | POA: Diagnosis not present

## 2020-04-24 DIAGNOSIS — I10 Essential (primary) hypertension: Secondary | ICD-10-CM

## 2020-04-24 DIAGNOSIS — F339 Major depressive disorder, recurrent, unspecified: Secondary | ICD-10-CM

## 2020-04-24 NOTE — Progress Notes (Signed)
Location:    [facility] Room Number: 110/W Place of Service:  SNF (31)   CODE STATUS: Full Code  Allergies  Allergen Reactions  . Lisinopril   . Penicillins Nausea Only    Has patient had a PCN reaction causing immediate rash, facial/tongue/throat swelling, SOB or lightheadedness with hypotension: Yes Has patient had a PCN reaction causing severe rash involving mucus membranes or skin necrosis: No Has patient had a PCN reaction that required hospitalization No Has patient had a PCN reaction occurring within the last 10 years: No If all of the above answers are "NO", then may proceed with Cephalosporin use.   . Sulfa Antibiotics Nausea And Vomiting    Chief Complaint  Patient presents with  . Medical Management of Chronic Issues        Major depression recurrent chronic:  Hypertension associated with type 2 diabetes mellitus:   Dyslipidemia associated with type 2 diabetes mellitus     HPI:  She is a 79 year old long term resident of this facility being seen for the management of her chronic illnesses: depression; hypertension; dyslipidemia. There are no reports of uncontrolled pain; no changes in her appetite; no reports of depression or anxiety.   Past Medical History:  Diagnosis Date  . Anxiety   . Bowel obstruction (HCC)   . Burning with urination 01/04/2016  . Diabetes mellitus without complication (HCC)   . Genital herpes   . GERD (gastroesophageal reflux disease)   . Hematuria 01/04/2016  . Herpes 12/22/2015  . Hyperlipidemia   . Hypertension   . LLQ abdominal tenderness 01/04/2016  . Neuropathy   . Pelvic pressure in female 01/04/2016  . Recurrent UTI   . Sciatic nerve disease   . Small bowel obstruction (HCC) 04/27/2016  . Urinary frequency 01/04/2016  . Urticaria     Past Surgical History:  Procedure Laterality Date  . ABDOMINAL HYSTERECTOMY    . APPLICATION OF INTRAOPERATIVE CT SCAN N/A 12/19/2018   Procedure: APPLICATION OF  INTRAOPERATIVE CT SCAN;  Surgeon: Maeola Harman, MD;  Location: [facility] OR;  Service: Neurosurgery;  Laterality: N/A;  . bowel obstruction    . COLON SURGERY     blocked colon  . POSTERIOR CERVICAL FUSION/FORAMINOTOMY N/A 12/19/2018   Procedure: Cervical Seven to Thoracic One Posterior cervical fusion;  Surgeon: Maeola Harman, MD;  Location: [facility] OR;  Service: Neurosurgery;  Laterality: N/A;  Cervical Seven to Thoracic One Posterior cervical fusion    Social History   Socioeconomic History  . Marital status: Single    Spouse name: Not on file  . Number of children: Not on file  . Years of education: Not on file  . Highest education level: Not on file  Occupational History  . Not on file  Tobacco Use  . Smoking status: Never Smoker  . Smokeless tobacco: Current User    Types: Snuff  Substance and Sexual Activity  . Alcohol use: No  . Drug use: No  . Sexual activity: Not Currently    Birth control/protection: Surgical    Comment: hyst  Other Topics Concern  . Not on file  Social History Narrative  . Not on file   Social Determinants of Health   Financial Resource Strain:   . Difficulty of Paying Living Expenses:   Food Insecurity:   . Worried About Programme researcher, broadcasting/film/video in the Last Year:   . Barista in the Last Year:   Transportation Needs:   .  Lack of Transportation (Medical):   Marland Kitchen Lack of Transportation (Non-Medical):   Physical Activity:   . Days of Exercise per Week:   . Minutes of Exercise per Session:   Stress:   . Feeling of Stress :   Social Connections:   . Frequency of Communication with Friends and Family:   . Frequency of Social Gatherings with Friends and Family:   . Attends Religious Services:   . Active Member of Clubs or Organizations:   . Attends Banker Meetings:   Marland Kitchen Marital Status:   Intimate Partner Violence:   . Fear of Current or Ex-Partner:   . Emotionally Abused:   Marland Kitchen Physically Abused:   . Sexually Abused:    Family History   Problem Relation Age of Onset  . Diabetes Mother   . Heart disease Brother   . Hypertension Brother   . Diabetes Brother   . Hypertension Daughter   . Diabetes Brother   . Diabetes Brother   . Alzheimer's disease Brother   . Anxiety disorder Daughter       VITAL SIGNS BP (!) 146/76   Pulse 73   Temp (!) 97.5 F (36.4 C) (Oral)   Resp 18   Ht 5' (1.524 m)   Wt 170 lb 9.6 oz (77.4 kg)   SpO2 98%   BMI 33.32 kg/m   No facility-administered encounter medications on file as of 04/24/2020.   Outpatient Encounter Medications as of 04/24/2020  Medication Sig  . acetaminophen (TYLENOL) 650 MG CR tablet Take 650 mg by mouth every 8 (eight) hours as needed for pain.  Marland Kitchen acetaminophen (TYLENOL) 650 MG CR tablet Take 650 mg by mouth at bedtime.  Marland Kitchen amLODipine (NORVASC) 10 MG tablet Take 1 tablet (10 mg total) by mouth daily.  Marland Kitchen aspirin 81 MG chewable tablet Chew 81 mg by mouth daily.  Lucilla Lame [place]-Castor Oil (VENELEX) OINT Apply topically. apply to residents buttocks q shift PRN Every Shift - PRN  . DULoxetine (CYMBALTA) 60 MG capsule Take 60 mg by mouth daily.   . Emollient (EUCERIN) lotion Apply topically 4 (four) times daily as needed for dry skin.  Marland Kitchen lamoTRIgine (LAMICTAL) 25 MG tablet Take 25 mg by mouth 2 (two) times daily.   Marland Kitchen linagliptin (TRADJENTA) 5 MG TABS tablet Take 5 mg by mouth daily.  Marland Kitchen loratadine (CLARITIN) 10 MG tablet Take 10 mg by mouth daily.  . Melatonin 5 MG TABS Take 5 mg by mouth at bedtime.  . Menthol, Topical Analgesic, (BIOFREEZE ROLL-ON COLORLESS) 4 % GEL Apply 1 application topically daily as needed (FOR PAIN).   Marland Kitchen metFORMIN (GLUCOPHAGE) 1000 MG tablet Take 1,000 mg by mouth 2 (two) times daily with a meal.   . montelukast (SINGULAIR) 10 MG tablet Take 1 tablet (10 mg total) by mouth at bedtime.  . NON FORMULARY Diet:  Consistent Carbohydrate,  . pantoprazole (PROTONIX) 40 MG tablet Take 40 mg by mouth every morning.  . polyethylene glycol (MIRALAX /  GLYCOLAX) packet Take 17 g by mouth daily.  Marland Kitchen senna-docusate (SENOKOT-S) 8.6-50 MG tablet Take 1 tablet by mouth at bedtime.   . simvastatin (ZOCOR) 10 MG tablet Take 10 mg by mouth at bedtime.   . Sodium Fluoride (PREVIDENT 5000 BOOSTER) 1.1 % PSTE Place 1 application onto teeth at bedtime.   . traMADol (ULTRAM) 50 MG tablet Take 1 tablet (50 mg total) by mouth 2 (two) times daily.  Marland Kitchen triamcinolone (NASACORT ALLERGY 24HR) 55 MCG/ACT AERO nasal inhaler  Place 1 spray into the nose daily.  . valACYclovir (VALTREX) 500 MG tablet Take 500 mg by mouth daily.   . [DISCONTINUED] gabapentin (NEURONTIN) 300 MG capsule Take 300 mg by mouth 2 (two) times daily. Reported on 02/03/2016     SIGNIFICANT DIAGNOSTIC EXAMS   PREVIOUS  12-18-19: chest x-ray: Mild right upper lobe and bibasilar airspace opacities which could reflect early pneumonia (including atypical/viral infection).  12-17-18; ct of abdomen and pelvis: Slight edema of the mucosa of the distal antrum of the stomach. This could represent gastritis. Otherwise, benign-appearing abdomen and pelvis.  NO NEW EXAMS.   LABS REVIEWED PREVIOUS;   12-16-19: urine culture: citrobacter youngae 12-18-19: wbc 16.6; hgb 14.7; hct 45.8; mcv 87.1 plt 330; glucose 168; bun 23; creat 0.85; k+ 3.7; na++ 136; ca 9.7; liver normal albumin 4.4 12-20-19: wbc 12.9; hgb 14.8; hct 48.2 ;mcv 90.4 plt 296 glucose 152; bun 33; creat 1.03 ;k+ 3.8; na++ 139; ca 9.7  01-02-20: wbc 14.5; hgb 12.9; hct 40.8 mcv 89.9 plt 171; glucose 128; bun 10; creat 0.80 ;k+ 4.3; na++ 138; ca 9.3 urine culture: multiple species.   TODAY  02-24-20: chol 147; ldl 71; trig 120 hdl 52 hgb 1c 6.2 04-02-20; urine for micro-albumin <3.0     Review of Systems  Constitutional: Negative for malaise/fatigue.  Respiratory: Negative for cough and shortness of breath.   Cardiovascular: Negative for chest pain, palpitations and leg swelling.  Gastrointestinal: Negative for abdominal pain, constipation and  heartburn.  Musculoskeletal: Negative for back pain, joint pain and myalgias.  Skin: Negative.   Neurological: Negative for dizziness.  Psychiatric/Behavioral: The patient is not nervous/anxious.     Physical Exam Constitutional:      General: She is not in acute distress.    Appearance: She is well-developed. She is not diaphoretic.  Neck:     Thyroid: No thyromegaly.  Cardiovascular:     Rate and Rhythm: Normal rate and regular rhythm.     Pulses: Normal pulses.     Heart sounds: Normal heart sounds.  Pulmonary:     Effort: Pulmonary effort is normal. No respiratory distress.     Breath sounds: Normal breath sounds.  Abdominal:     General: Bowel sounds are normal. There is no distension.     Palpations: Abdomen is soft.     Tenderness: There is no abdominal tenderness.  Musculoskeletal:        General: Normal range of motion.     Cervical back: Neck supple.     Right lower leg: No edema.     Left lower leg: No edema.  Lymphadenopathy:     Cervical: No cervical adenopathy.  Skin:    General: Skin is warm and dry.  Neurological:     Mental Status: She is alert. Mental status is at baseline.  Psychiatric:        Mood and Affect: Mood normal.     ASSESSMENT/ PLAN:  TODAY  1. Major depression recurrent chronic: is stable will continue cymbalta 60 mg daily lamictal 50 mg twice daily for mood and melatonin 5 mg nightly   2. Hypertension associated with type 2 diabetes mellitus: is stable b/p 144/76 will continue norvasc 10 mg daily asa 81 mg daily   3. Dyslipidemia associated with type 2 diabetes mellitus is stable will continue zocor 10 mg daily    PREVIOUS   4. Non-insulin dependent type 2 diabetes mellitus: is stable will continue metformin 1 gm twice daily tradjenta 5 mg daily  is on statin and asa   5. Gastroesophageal reflux disease without esophagitis: Is stable will continue protonix 40 mg daily   6. Chronic non-seasonal allergic rhinitis: is stable will  continue nasocort daily; claritin 10 mg daily singulair 10 mg daily   7. Herpes: is stable no recent outbreak will continue valtrex 500 mg daily   8. Chronic constipation: is stable will continue miralax daily and senna s daily    MD is aware of resident's narcotic use and is in agreement with current plan of care. We will attempt to wean resident as appropriate.  Ok Edwards NP [facility]  Contact [phone] Monday through Friday 8am- 5pm  After hours call [phone]

## 2020-05-14 ENCOUNTER — Other Ambulatory Visit (HOSPITAL_COMMUNITY)
Admission: RE | Admit: 2020-05-14 | Discharge: 2020-05-14 | Disposition: A | Discharge status: Home / Self Care | Payer: Medicare Other | Source: Skilled Nursing Facility | Attending: Adult Health | Admitting: Adult Health

## 2020-05-14 DIAGNOSIS — E119 Type 2 diabetes mellitus without complications: Secondary | ICD-10-CM | POA: Diagnosis present

## 2020-05-14 LAB — CBC WITH DIFFERENTIAL/PLATELET
Abs Immature Granulocytes: 0.03 10*3/uL (ref 0.00–0.07)
Basophils Absolute: 0.1 10*3/uL (ref 0.0–0.1)
Basophils Relative: 2 %
Eosinophils Absolute: 0.4 10*3/uL (ref 0.0–0.5)
Eosinophils Relative: 4 %
HCT: 39.7 % (ref 36.0–46.0)
Hemoglobin: 12.4 g/dL (ref 12.0–15.0)
Immature Granulocytes: 0 %
Lymphocytes Relative: 37 %
Lymphs Abs: 3.2 10*3/uL (ref 0.7–4.0)
MCH: 27.6 pg (ref 26.0–34.0)
MCHC: 31.2 g/dL (ref 30.0–36.0)
MCV: 88.4 fL (ref 80.0–100.0)
Monocytes Absolute: 0.8 10*3/uL (ref 0.1–1.0)
Monocytes Relative: 9 %
Neutro Abs: 4.2 10*3/uL (ref 1.7–7.7)
Neutrophils Relative %: 48 %
Platelets: 199 10*3/uL (ref 150–400)
RBC: 4.49 MIL/uL (ref 3.87–5.11)
RDW: 14.3 % (ref 11.5–15.5)
WBC: 8.8 10*3/uL (ref 4.0–10.5)
nRBC: 0 % (ref 0.0–0.2)

## 2020-05-14 LAB — HEMOGLOBIN A1C
Hgb A1c MFr Bld: 6.8 % — ABNORMAL HIGH (ref 4.8–5.6)
Mean Plasma Glucose: 148.46 mg/dL

## 2020-05-14 LAB — VITAMIN B12: Vitamin B-12: 154 pg/mL — ABNORMAL LOW (ref 180–914)

## 2020-05-19 ENCOUNTER — Non-Acute Institutional Stay (SKILLED_NURSING_FACILITY): Payer: Medicare Other | Admitting: Adult Health

## 2020-05-19 ENCOUNTER — Encounter: Payer: Self-pay | Admitting: Adult Health

## 2020-05-19 ENCOUNTER — Other Ambulatory Visit: Payer: Self-pay | Admitting: Adult Health

## 2020-05-19 DIAGNOSIS — E119 Type 2 diabetes mellitus without complications: Secondary | ICD-10-CM

## 2020-05-19 DIAGNOSIS — K219 Gastro-esophageal reflux disease without esophagitis: Secondary | ICD-10-CM | POA: Diagnosis not present

## 2020-05-19 DIAGNOSIS — J3089 Other allergic rhinitis: Secondary | ICD-10-CM | POA: Diagnosis not present

## 2020-05-19 MED ORDER — TRAMADOL HCL 50 MG PO TABS: 50.0000 mg | ORAL_TABLET | Freq: Two times a day (BID) | ORAL | 0 refills | Status: DC

## 2020-05-19 NOTE — Progress Notes (Signed)
Location:  [facility] Room Number: 110-W Place of Service:  SNF (31)   CODE STATUS:  FULL CODE  Allergies  Allergen Reactions  . Lisinopril   . Penicillins Nausea Only    Has patient had a PCN reaction causing immediate rash, facial/tongue/throat swelling, SOB or lightheadedness with hypotension: Yes Has patient had a PCN reaction causing severe rash involving mucus membranes or skin necrosis: No Has patient had a PCN reaction that required hospitalization No Has patient had a PCN reaction occurring within the last 10 years: No If all of the above answers are "NO", then may proceed with Cephalosporin use.   . Sulfa Antibiotics Nausea And Vomiting    Chief Complaint  Patient presents with  . Medical Management of Chronic Issues           Non-insulin dependent type 2 diabetes mellitus      Gastroesophageal reflux disease without esophagitis:    Chronic non-seasonal allergic rhinitis:    HPI:  She is a 79 year old long term resident of this facility being seen for the management of her chronic illnesses: diabetes; gerd; allergic rhinitis. There are no reports of anxiety or agitation; no reports of uncontrolled pain.   Past Medical History:  Diagnosis Date  . Anxiety   . Bowel obstruction (HCC)   . Burning with urination 01/04/2016  . Diabetes mellitus without complication (HCC)   . Genital herpes   . GERD (gastroesophageal reflux disease)   . Hematuria 01/04/2016  . Herpes 12/22/2015  . Hyperlipidemia   . Hypertension   . LLQ abdominal tenderness 01/04/2016  . Neuropathy   . Pelvic pressure in female 01/04/2016  . Recurrent UTI   . Sciatic nerve disease   . Small bowel obstruction (HCC) 04/27/2016  . Urinary frequency 01/04/2016  . Urticaria     Past Surgical History:  Procedure Laterality Date  . ABDOMINAL HYSTERECTOMY    . APPLICATION OF INTRAOPERATIVE CT SCAN N/A 12/19/2018   Procedure: APPLICATION OF INTRAOPERATIVE CT SCAN;  Surgeon: Maeola Harman, MD;  Location: [facility] OR;  Service: Neurosurgery;  Laterality: N/A;  . bowel obstruction    . COLON SURGERY     blocked colon  . POSTERIOR CERVICAL FUSION/FORAMINOTOMY N/A 12/19/2018   Procedure: Cervical Seven to Thoracic One Posterior cervical fusion;  Surgeon: Maeola Harman, MD;  Location: [facility] OR;  Service: Neurosurgery;  Laterality: N/A;  Cervical Seven to Thoracic One Posterior cervical fusion    Social History   Socioeconomic History  . Marital status: Single    Spouse name: Not on file  . Number of children: Not on file  . Years of education: Not on file  . Highest education level: Not on file  Occupational History  . Not on file  Tobacco Use  . Smoking status: Never Smoker  . Smokeless tobacco: Current User    Types: Snuff  Substance and Sexual Activity  . Alcohol use: No  . Drug use: No  . Sexual activity: Not Currently    Birth control/protection: Surgical    Comment: hyst  Other Topics Concern  . Not on file  Social History Narrative  . Not on file   Social Determinants of Health   Financial Resource Strain:   . Difficulty of Paying Living Expenses:   Food Insecurity:   . Worried About Programme researcher, broadcasting/film/video in the Last Year:   . Barista in the Last Year:   Transportation Needs:   . Lack  of Transportation (Medical):   Marland Kitchen Lack of Transportation (Non-Medical):   Physical Activity:   . Days of Exercise per Week:   . Minutes of Exercise per Session:   Stress:   . Feeling of Stress :   Social Connections:   . Frequency of Communication with Friends and Family:   . Frequency of Social Gatherings with Friends and Family:   . Attends Religious Services:   . Active Member of Clubs or Organizations:   . Attends Banker Meetings:   Marland Kitchen Marital Status:   Intimate Partner Violence:   . Fear of Current or Ex-Partner:   . Emotionally Abused:   Marland Kitchen Physically Abused:   . Sexually Abused:    Family History  Problem Relation Age of Onset  .  Diabetes Mother   . Heart disease Brother   . Hypertension Brother   . Diabetes Brother   . Hypertension Daughter   . Diabetes Brother   . Diabetes Brother   . Alzheimer's disease Brother   . Anxiety disorder Daughter       VITAL SIGNS BP 136/80   Pulse 70   Temp 97.9 F (36.6 C) (Oral)   Resp 18   Ht 5' (1.524 m)   Wt 175 lb (79.4 kg)   SpO2 98%   BMI 34.18 kg/m   No facility-administered encounter medications on file as of 05/19/2020.   Outpatient Encounter Medications as of 05/19/2020  Medication Sig  . acetaminophen (TYLENOL) 650 MG CR tablet Take 650 mg by mouth every 8 (eight) hours as needed for pain.  Marland Kitchen acetaminophen (TYLENOL) 650 MG CR tablet Take 650 mg by mouth at bedtime.  Marland Kitchen amLODipine (NORVASC) 10 MG tablet Take 1 tablet (10 mg total) by mouth daily.  Marland Kitchen aspirin 81 MG chewable tablet Chew 81 mg by mouth daily.  Lucilla Lame [place]-Castor Oil (VENELEX) OINT Apply topically. apply to residents buttocks q shift PRN Every Shift - PRN  . DULoxetine (CYMBALTA) 60 MG capsule Take 60 mg by mouth daily.   . Emollient (EUCERIN) lotion Apply topically 4 (four) times daily as needed for dry skin.  Marland Kitchen lamoTRIgine (LAMICTAL) 25 MG tablet Take 25 mg by mouth 2 (two) times daily.   Marland Kitchen linagliptin (TRADJENTA) 5 MG TABS tablet Take 5 mg by mouth daily.  Marland Kitchen loratadine (CLARITIN) 10 MG tablet Take 10 mg by mouth daily.  . Melatonin 5 MG TABS Take 5 mg by mouth at bedtime.  . Menthol, Topical Analgesic, (BIOFREEZE ROLL-ON COLORLESS) 4 % GEL Apply 1 application topically daily as needed (FOR PAIN).   Marland Kitchen metFORMIN (GLUCOPHAGE) 1000 MG tablet Take 1,000 mg by mouth 2 (two) times daily with a meal.   . montelukast (SINGULAIR) 10 MG tablet Take 1 tablet (10 mg total) by mouth at bedtime.  . NON FORMULARY Diet:  Consistent Carbohydrate,  . pantoprazole (PROTONIX) 40 MG tablet Take 40 mg by mouth every morning.  . polyethylene glycol (MIRALAX / GLYCOLAX) packet Take 17 g by mouth daily.  Marland Kitchen  senna-docusate (SENOKOT-S) 8.6-50 MG tablet Take 1 tablet by mouth at bedtime.   . simvastatin (ZOCOR) 10 MG tablet Take 10 mg by mouth at bedtime.   . Sodium Fluoride (PREVIDENT 5000 BOOSTER) 1.1 % PSTE Place 1 application onto teeth at bedtime.   . traMADol (ULTRAM) 50 MG tablet Take 1 tablet (50 mg total) by mouth 2 (two) times daily.  Marland Kitchen triamcinolone (NASACORT ALLERGY 24HR) 55 MCG/ACT AERO nasal inhaler Place 1 spray into the  nose daily.  . valACYclovir (VALTREX) 500 MG tablet Take 500 mg by mouth daily.   . [DISCONTINUED] gabapentin (NEURONTIN) 300 MG capsule Take 300 mg by mouth 2 (two) times daily. Reported on 02/03/2016     SIGNIFICANT DIAGNOSTIC EXAMS   PREVIOUS  12-18-19: chest x-ray: Mild right upper lobe and bibasilar airspace opacities which could reflect early pneumonia (including atypical/viral infection).  12-17-18; ct of abdomen and pelvis: Slight edema of the mucosa of the distal antrum of the stomach. This could represent gastritis. Otherwise, benign-appearing abdomen and pelvis.  NO NEW EXAMS.   LABS REVIEWED PREVIOUS;   12-16-19: urine culture: citrobacter youngae 12-18-19: wbc 16.6; hgb 14.7; hct 45.8; mcv 87.1 plt 330; glucose 168; bun 23; creat 0.85; k+ 3.7; na++ 136; ca 9.7; liver normal albumin 4.4 12-20-19: wbc 12.9; hgb 14.8; hct 48.2 ;mcv 90.4 plt 296 glucose 152; bun 33; creat 1.03 ;k+ 3.8; na++ 139; ca 9.7  01-02-20: wbc 14.5; hgb 12.9; hct 40.8 mcv 89.9 plt 171; glucose 128; bun 10; creat 0.80 ;k+ 4.3; na++ 138; ca 9.3 urine culture: multiple species.  02-24-20: chol 147; ldl 71; trig 120 hdl 52 hgb 1c 6.2 04-02-20; urine for micro-albumin <3.0   NO NEW LABS.     Review of Systems  Constitutional: Negative for malaise/fatigue.  Respiratory: Negative for cough and shortness of breath.   Cardiovascular: Negative for chest pain, palpitations and leg swelling.  Gastrointestinal: Negative for abdominal pain, constipation and heartburn.  Musculoskeletal: Negative for  back pain, joint pain and myalgias.  Skin: Negative.   Neurological: Negative for dizziness.  Psychiatric/Behavioral: The patient is not nervous/anxious.       Physical Exam Constitutional:      General: She is not in acute distress.    Appearance: She is well-developed. She is not diaphoretic.  Neck:     Thyroid: No thyromegaly.  Cardiovascular:     Rate and Rhythm: Normal rate and regular rhythm.     Pulses: Normal pulses.     Heart sounds: Normal heart sounds.  Pulmonary:     Effort: Pulmonary effort is normal. No respiratory distress.     Breath sounds: Normal breath sounds.  Abdominal:     General: Bowel sounds are normal. There is no distension.     Palpations: Abdomen is soft.     Tenderness: There is no abdominal tenderness.  Musculoskeletal:        General: Normal range of motion.     Cervical back: Neck supple.     Right lower leg: No edema.     Left lower leg: No edema.  Lymphadenopathy:     Cervical: No cervical adenopathy.  Skin:    General: Skin is warm and dry.  Neurological:     Mental Status: She is alert. Mental status is at baseline.  Psychiatric:        Mood and Affect: Mood normal.      ASSESSMENT/ PLAN:  TODAY  1. Non-insulin dependent type 2 diabetes mellitus is stable hgb a1c 6.2  will continue metformin 1 gm twice daily tradjenta 5 mg daily is on statin and asa  2. Gastroesophageal reflux disease without esophagitis: is stable will continue protonix 40 mg daily   3. Chronic non-seasonal allergic rhinitis: is stable will continue claritin 10 mg daily and singulair 10 mg daily     PREVIOUS   4. Herpes: is stable no recent outbreak will continue valtrex 500 mg daily   5. Chronic constipation: is stable will continue  miralax daily and senna s daily   6. Major depression recurrent chronic: is stable will continue cymbalta 60 mg daily lamictal 50 mg twice daily for mood and melatonin 5 mg nightly   7. Hypertension associated with type 2  diabetes mellitus: is stable b/p 136/80 will continue norvasc 10 mg daily asa 81 mg daily   8. Dyslipidemia associated with type 2 diabetes mellitus is stable will continue zocor 10 mg daily 8        MD is aware of resident's narcotic use and is in agreement with current plan of care. We will attempt to wean resident as appropriate.  Ok Edwards NP [facility] Adult Medicine  Contact [phone] Monday through Friday 8am- 5pm  After hours call [phone]

## 2020-05-27 ENCOUNTER — Non-Acute Institutional Stay: Payer: Self-pay | Admitting: Adult Health

## 2020-05-27 ENCOUNTER — Encounter: Payer: Self-pay | Admitting: Adult Health

## 2020-05-27 DIAGNOSIS — R52 Pain, unspecified: Secondary | ICD-10-CM

## 2020-05-27 DIAGNOSIS — F339 Major depressive disorder, recurrent, unspecified: Secondary | ICD-10-CM

## 2020-05-27 NOTE — Progress Notes (Signed)
Location:    [facility] Room Number: 110/W Place of Service:  SNF (31)   CODE STATUS: Full Code  Allergies  Allergen Reactions  . Lisinopril   . Penicillins Nausea Only    Has patient had a PCN reaction causing immediate rash, facial/tongue/throat swelling, SOB or lightheadedness with hypotension: Yes Has patient had a PCN reaction causing severe rash involving mucus membranes or skin necrosis: No Has patient had a PCN reaction that required hospitalization No Has patient had a PCN reaction occurring within the last 10 years: No If all of the above answers are "NO", then may proceed with Cephalosporin use.   . Sulfa Antibiotics Nausea And Vomiting    Chief Complaint  Patient presents with  . Acute Visit    Medication Review     HPI:  We have come together for her medication review. She is presently taking cymbalta 60 mg daily and ultram 50 mg twice daily. She denies any uncontrolled pain. There are no reports of anxiety or depressive thoughts. No reports of insomnia; or agitation. She is due for a dose reduction.   Past Medical History:  Diagnosis Date  . Anxiety   . Bowel obstruction ([place])   . Burning with urination 01/04/2016  . Diabetes mellitus without complication ([place])   . Genital herpes   . GERD (gastroesophageal reflux disease)   . Hematuria 01/04/2016  . Herpes 12/22/2015  . Hyperlipidemia   . Hypertension   . LLQ abdominal tenderness 01/04/2016  . Neuropathy   . Pelvic pressure in female 01/04/2016  . Recurrent UTI   . Sciatic nerve disease   . Small bowel obstruction ([place]) 04/27/2016  . Urinary frequency 01/04/2016  . Urticaria     Past Surgical History:  Procedure Laterality Date  . ABDOMINAL HYSTERECTOMY    . APPLICATION OF INTRAOPERATIVE CT SCAN N/A 12/19/2018   Procedure: APPLICATION OF INTRAOPERATIVE CT SCAN;  Surgeon: Erline Levine, MD;  Location: [facility];  Service: Neurosurgery;  Laterality: N/A;  . bowel obstruction    . COLON  SURGERY     blocked colon  . POSTERIOR CERVICAL FUSION/FORAMINOTOMY N/A 12/19/2018   Procedure: Cervical Seven to Thoracic One Posterior cervical fusion;  Surgeon: Erline Levine, MD;  Location: [place];  Service: Neurosurgery;  Laterality: N/A;  Cervical Seven to Thoracic One Posterior cervical fusion    Social History   Socioeconomic History  . Marital status: Single    Spouse name: Not on file  . Number of children: Not on file  . Years of education: Not on file  . Highest education level: Not on file  Occupational History  . Not on file  Tobacco Use  . Smoking status: Never Smoker  . Smokeless tobacco: Current User    Types: Snuff  Vaping Use  . Vaping Use: Never used  Substance and Sexual Activity  . Alcohol use: No  . Drug use: No  . Sexual activity: Not Currently    Birth control/protection: Surgical    Comment: hyst  Other Topics Concern  . Not on file  Social History Narrative  . Not on file   Social Determinants of Health   Financial Resource Strain:   . Difficulty of Paying Living Expenses:   Food Insecurity:   . Worried About Charity fundraiser in the Last Year:   . Arboriculturist in the Last Year:   Transportation Needs:   . Film/video editor (Medical):   Marland Kitchen Lack of Transportation (  Non-Medical):   Physical Activity:   . Days of Exercise per Week:   . Minutes of Exercise per Session:   Stress:   . Feeling of Stress :   Social Connections:   . Frequency of Communication with Friends and Family:   . Frequency of Social Gatherings with Friends and Family:   . Attends Religious Services:   . Active Member of Clubs or Organizations:   . Attends Banker Meetings:   Marland Kitchen Marital Status:   Intimate Partner Violence:   . Fear of Current or Ex-Partner:   . Emotionally Abused:   Marland Kitchen Physically Abused:   . Sexually Abused:    Family History  Problem Relation Age of Onset  . Diabetes Mother   . Heart disease Brother   . Hypertension Brother     . Diabetes Brother   . Hypertension Daughter   . Diabetes Brother   . Diabetes Brother   . Alzheimer's disease Brother   . Anxiety disorder Daughter       VITAL SIGNS BP 126/70   Pulse 77   Temp (!) 97.2 F (36.2 C) (Oral)   Ht 5' (1.524 m)   Wt 175 lb (79.4 kg)   BMI 34.18 kg/m   No facility-administered encounter medications on file as of 05/27/2020.   Outpatient Encounter Medications as of 05/27/2020  Medication Sig  . acetaminophen (TYLENOL) 650 MG CR tablet Take 650 mg by mouth every 8 (eight) hours as needed for pain.  Marland Kitchen acetaminophen (TYLENOL) 650 MG CR tablet Take 650 mg by mouth at bedtime.  Marland Kitchen amLODipine (NORVASC) 10 MG tablet Take 1 tablet (10 mg total) by mouth daily.  Marland Kitchen aspirin 81 MG chewable tablet Chew 81 mg by mouth daily.  Lucilla Lame [place]-Castor Oil (VENELEX) OINT Apply topically. apply to residents buttocks q shift PRN Every Shift - PRN  . DULoxetine (CYMBALTA) 60 MG capsule Take 60 mg by mouth daily.   . Emollient (EUCERIN) lotion Apply topically 4 (four) times daily as needed for dry skin.  Marland Kitchen lamoTRIgine (LAMICTAL) 25 MG tablet Take 25 mg by mouth 2 (two) times daily.   Marland Kitchen linagliptin (TRADJENTA) 5 MG TABS tablet Take 5 mg by mouth daily.  Marland Kitchen loratadine (CLARITIN) 10 MG tablet Take 10 mg by mouth daily.  . Melatonin 5 MG TABS Take 5 mg by mouth at bedtime.  . Menthol, Topical Analgesic, (BIOFREEZE ROLL-ON COLORLESS) 4 % GEL Apply 1 application topically daily as needed (FOR PAIN).   Marland Kitchen metFORMIN (GLUCOPHAGE) 1000 MG tablet Take 1,000 mg by mouth 2 (two) times daily with a meal.   . montelukast (SINGULAIR) 10 MG tablet Take 1 tablet (10 mg total) by mouth at bedtime.  . NON FORMULARY Diet:  Consistent Carbohydrate,  . pantoprazole (PROTONIX) 40 MG tablet Take 40 mg by mouth every morning.  . polyethylene glycol (MIRALAX / GLYCOLAX) packet Take 17 g by mouth daily.  Marland Kitchen senna-docusate (SENOKOT-S) 8.6-50 MG tablet Take 1 tablet by mouth at bedtime.   . simvastatin  (ZOCOR) 10 MG tablet Take 10 mg by mouth at bedtime.   . Sodium Fluoride (PREVIDENT 5000 BOOSTER) 1.1 % PSTE Place 1 application onto teeth at bedtime.   . traMADol (ULTRAM) 50 MG tablet Take 1 tablet (50 mg total) by mouth 2 (two) times daily.  Marland Kitchen triamcinolone (NASACORT ALLERGY 24HR) 55 MCG/ACT AERO nasal inhaler Place 1 spray into the nose daily.  . valACYclovir (VALTREX) 500 MG tablet Take 500 mg by mouth daily.   . [  DISCONTINUED] gabapentin (NEURONTIN) 300 MG capsule Take 300 mg by mouth 2 (two) times daily. Reported on 02/03/2016     SIGNIFICANT DIAGNOSTIC EXAMS   PREVIOUS  12-18-19: chest x-ray: Mild right upper lobe and bibasilar airspace opacities which could reflect early pneumonia (including atypical/viral infection).  12-17-18; ct of abdomen and pelvis: Slight edema of the mucosa of the distal antrum of the stomach. This could represent gastritis. Otherwise, benign-appearing abdomen and pelvis.  NO NEW EXAMS.   LABS REVIEWED PREVIOUS;   12-16-19: urine culture: citrobacter youngae 12-18-19: wbc 16.6; hgb 14.7; hct 45.8; mcv 87.1 plt 330; glucose 168; bun 23; creat 0.85; k+ 3.7; na++ 136; ca 9.7; liver normal albumin 4.4 12-20-19: wbc 12.9; hgb 14.8; hct 48.2 ;mcv 90.4 plt 296 glucose 152; bun 33; creat 1.03 ;k+ 3.8; na++ 139; ca 9.7  01-02-20: wbc 14.5; hgb 12.9; hct 40.8 mcv 89.9 plt 171; glucose 128; bun 10; creat 0.80 ;k+ 4.3; na++ 138; ca 9.3 urine culture: multiple species.  02-24-20: chol 147; ldl 71; trig 120 hdl 52 hgb 1c 6.2 04-02-20; urine for micro-albumin <3.0   NO NEW LABS.    Review of Systems  Constitutional: Negative for malaise/fatigue.  Respiratory: Negative for cough and shortness of breath.   Cardiovascular: Negative for chest pain, palpitations and leg swelling.  Gastrointestinal: Negative for abdominal pain, constipation and heartburn.  Musculoskeletal: Negative for back pain, joint pain and myalgias.  Skin: Negative.   Neurological: Negative for dizziness.    Psychiatric/Behavioral: The patient is not nervous/anxious.      Physical Exam Constitutional:      General: She is not in acute distress.    Appearance: She is well-developed. She is not diaphoretic.  Neck:     Thyroid: No thyromegaly.  Cardiovascular:     Rate and Rhythm: Normal rate and regular rhythm.     Pulses: Normal pulses.     Heart sounds: Normal heart sounds.  Pulmonary:     Effort: Pulmonary effort is normal. No respiratory distress.     Breath sounds: Normal breath sounds.  Abdominal:     General: Bowel sounds are normal. There is no distension.     Palpations: Abdomen is soft.     Tenderness: There is no abdominal tenderness.  Musculoskeletal:        General: Normal range of motion.     Cervical back: Neck supple.     Right lower leg: No edema.     Left lower leg: No edema.  Lymphadenopathy:     Cervical: No cervical adenopathy.  Skin:    General: Skin is warm and dry.  Neurological:     Mental Status: She is alert. Mental status is at baseline.  Psychiatric:        Mood and Affect: Mood normal.       ASSESSMENT/ PLAN:  TODAY  1. Major depression recurrent chronic 2. Chronic generalized pain  Will continue ultram 50 mg twice daily  Will lower her cymbalta to 30 mg daily  Will monitor her status.   MD is aware of resident's narcotic use and is in agreement with current plan of care. We will attempt to wean resident as appropriate.  Synthia Innocent NP [facility]  Contact [phone] Monday through Friday 8am- 5pm  After hours call [phone]

## 2020-06-12 ENCOUNTER — Non-Acute Institutional Stay (SKILLED_NURSING_FACILITY): Payer: Medicare Other | Admitting: Adult Health

## 2020-06-12 ENCOUNTER — Encounter: Payer: Self-pay | Admitting: Adult Health

## 2020-06-12 DIAGNOSIS — F339 Major depressive disorder, recurrent, unspecified: Secondary | ICD-10-CM

## 2020-06-12 NOTE — Progress Notes (Signed)
Location:    [facility] Room Number: 110/W Place of Service:  SNF (31)   CODE STATUS: Full Code  Allergies  Allergen Reactions  . Lisinopril   . Penicillins Nausea Only    Has patient had a PCN reaction causing immediate rash, facial/tongue/throat swelling, SOB or lightheadedness with hypotension: Yes Has patient had a PCN reaction causing severe rash involving mucus membranes or skin necrosis: No Has patient had a PCN reaction that required hospitalization No Has patient had a PCN reaction occurring within the last 10 years: No If all of the above answers are "NO", then may proceed with Cephalosporin use.   . Sulfa Antibiotics Nausea And Vomiting    Chief Complaint  Patient presents with  . Acute Visit    Hallucinations    HPI:  Since her cymbalta was reduced to 30 mg daily she has been having emotional outburst and hallucinations that involve her roommate. She tells me that she does feel anxious at times and is not sleeping well at night.   Past Medical History:  Diagnosis Date  . Anxiety   . Bowel obstruction ([facility])   . Burning with urination 01/04/2016  . Diabetes mellitus without complication ([facility])   . Genital herpes   . GERD (gastroesophageal reflux disease)   . Hematuria 01/04/2016  . Herpes 12/22/2015  . Hyperlipidemia   . Hypertension   . LLQ abdominal tenderness 01/04/2016  . Neuropathy   . Pelvic pressure in female 01/04/2016  . Recurrent UTI   . Sciatic nerve disease   . Small bowel obstruction ([facility]) 04/27/2016  . Urinary frequency 01/04/2016  . Urticaria     Past Surgical History:  Procedure Laterality Date  . ABDOMINAL HYSTERECTOMY    . APPLICATION OF INTRAOPERATIVE CT SCAN N/A 12/19/2018   Procedure: APPLICATION OF INTRAOPERATIVE CT SCAN;  Surgeon: Maeola Harman, MD;  Location[facility] OR;  Service: Neurosurgery;  Laterality: N/A;  . bowel obstruction    . COLON SURGERY     blocked colon  . POSTERIOR CERVICAL FUSION/FORAMINOTOMY N/A  12/19/2018   Procedure: Cervical Seven to Thoracic One Posterior cervical fusion;  Surgeon: Maeola Harman, MD;  Location: [facility] OR;  Service: Neurosurgery;  Laterality: N/A;  Cervical Seven to Thoracic One Posterior cervical fusion    Social History   Socioeconomic History  . Marital status: Single    Spouse name: Not on file  . Number of children: Not on file  . Years of education: Not on file  . Highest education level: Not on file  Occupational History  . Not on file  Tobacco Use  . Smoking status: Never Smoker  . Smokeless tobacco: Current User    Types: Snuff  Vaping Use  . Vaping Use: Never used  Substance and Sexual Activity  . Alcohol use: No  . Drug use: No  . Sexual activity: Not Currently    Birth control/protection: Surgical    Comment: hyst  Other Topics Concern  . Not on file  Social History Narrative  . Not on file   Social Determinants of Health   Financial Resource Strain:   . Difficulty of Paying Living Expenses:   Food Insecurity:   . Worried About Programme researcher, broadcasting/film/video in the Last Year:   . Barista in the Last Year:   Transportation Needs:   . Freight forwarder (Medical):   Marland Kitchen Lack of Transportation (Non-Medical):   Physical Activity:   . Days of Exercise per Week:   .  Minutes of Exercise per Session:   Stress:   . Feeling of Stress :   Social Connections:   . Frequency of Communication with Friends and Family:   . Frequency of Social Gatherings with Friends and Family:   . Attends Religious Services:   . Active Member of Clubs or Organizations:   . Attends Banker Meetings:   Marland Kitchen Marital Status:   Intimate Partner Violence:   . Fear of Current or Ex-Partner:   . Emotionally Abused:   Marland Kitchen Physically Abused:   . Sexually Abused:    Family History  Problem Relation Age of Onset  . Diabetes Mother   . Heart disease Brother   . Hypertension Brother   . Diabetes Brother   . Hypertension Daughter   . Diabetes Brother   .  Diabetes Brother   . Alzheimer's disease Brother   . Anxiety disorder Daughter       VITAL SIGNS BP 129/71   Pulse 71   Temp 98.6 F (37 C) (Oral)   Ht 5' (1.524 m)   Wt 176 lb 9.6 oz (80.1 kg)   BMI 34.49 kg/m   No facility-administered encounter medications on file as of 06/12/2020.   Outpatient Encounter Medications as of 06/12/2020  Medication Sig  . acetaminophen (TYLENOL) 650 MG CR tablet Take 650 mg by mouth every 8 (eight) hours as needed for pain.  Marland Kitchen acetaminophen (TYLENOL) 650 MG CR tablet Take 650 mg by mouth at bedtime.  Marland Kitchen amLODipine (NORVASC) 10 MG tablet Take 1 tablet (10 mg total) by mouth daily.  Marland Kitchen aspirin 81 MG chewable tablet Chew 81 mg by mouth daily.  Lucilla Lame [place]-Castor Oil (VENELEX) OINT Apply topically. apply to residents buttocks q shift PRN Every Shift - PRN  . DULoxetine (CYMBALTA) 60 MG capsule Take 60 mg by mouth daily.   . Emollient (EUCERIN) lotion Apply topically 4 (four) times daily as needed for dry skin.  Marland Kitchen lamoTRIgine (LAMICTAL) 25 MG tablet Take 25 mg by mouth 2 (two) times daily.   Marland Kitchen linagliptin (TRADJENTA) 5 MG TABS tablet Take 5 mg by mouth daily.  Marland Kitchen loratadine (CLARITIN) 10 MG tablet Take 10 mg by mouth daily.  . Melatonin 5 MG TABS Take 5 mg by mouth at bedtime.  . Menthol, Topical Analgesic, (BIOFREEZE ROLL-ON COLORLESS) 4 % GEL Apply 1 application topically daily as needed (FOR PAIN).   Marland Kitchen metFORMIN (GLUCOPHAGE) 1000 MG tablet Take 1,000 mg by mouth 2 (two) times daily with a meal.   . montelukast (SINGULAIR) 10 MG tablet Take 1 tablet (10 mg total) by mouth at bedtime.  . NON FORMULARY Diet:  Consistent Carbohydrate,  . pantoprazole (PROTONIX) 40 MG tablet Take 40 mg by mouth every morning.  . polyethylene glycol (MIRALAX / GLYCOLAX) packet Take 17 g by mouth daily.  Marland Kitchen senna-docusate (SENOKOT-S) 8.6-50 MG tablet Take 1 tablet by mouth at bedtime.   . simvastatin (ZOCOR) 10 MG tablet Take 10 mg by mouth at bedtime.   . Sodium Fluoride  (PREVIDENT 5000 BOOSTER) 1.1 % PSTE Place 1 application onto teeth at bedtime.   . traMADol (ULTRAM) 50 MG tablet Take 1 tablet (50 mg total) by mouth 2 (two) times daily.  Marland Kitchen triamcinolone (NASACORT ALLERGY 24HR) 55 MCG/ACT AERO nasal inhaler Place 1 spray into the nose daily.  . valACYclovir (VALTREX) 500 MG tablet Take 500 mg by mouth daily.   . [DISCONTINUED] gabapentin (NEURONTIN) 300 MG capsule Take 300 mg by mouth 2 (two)  times daily. Reported on 02/03/2016     SIGNIFICANT DIAGNOSTIC EXAMS   PREVIOUS  12-18-19: chest x-ray: Mild right upper lobe and bibasilar airspace opacities which could reflect early pneumonia (including atypical/viral infection).  12-18-19; ct of abdomen and pelvis: Slight edema of the mucosa of the distal antrum of the stomach. This could represent gastritis. Otherwise, benign-appearing abdomen and pelvis.  NO NEW EXAMS.   LABS REVIEWED PREVIOUS;   12-16-19: urine culture: citrobacter youngae 12-18-19: wbc 16.6; hgb 14.7; hct 45.8; mcv 87.1 plt 330; glucose 168; bun 23; creat 0.85; k+ 3.7; na++ 136; ca 9.7; liver normal albumin 4.4 12-20-19: wbc 12.9; hgb 14.8; hct 48.2 ;mcv 90.4 plt 296 glucose 152; bun 33; creat 1.03 ;k+ 3.8; na++ 139; ca 9.7  01-02-20: wbc 14.5; hgb 12.9; hct 40.8 mcv 89.9 plt 171; glucose 128; bun 10; creat 0.80 ;k+ 4.3; na++ 138; ca 9.3 urine culture: multiple species.  02-24-20: chol 147; ldl 71; trig 120 hdl 52 hgb 1c 6.2 04-02-20; urine for micro-albumin <3.0   NO NEW LABS.    Review of Systems  Constitutional: Negative for malaise/fatigue.  Respiratory: Negative for cough and shortness of breath.   Cardiovascular: Negative for chest pain, palpitations and leg swelling.  Gastrointestinal: Negative for abdominal pain, constipation and heartburn.  Musculoskeletal: Negative for back pain, joint pain and myalgias.  Skin: Negative.   Neurological: Negative for dizziness.  Psychiatric/Behavioral: The patient is nervous/anxious and has insomnia.       Physical Exam Constitutional:      General: She is not in acute distress.    Appearance: She is well-developed. She is not diaphoretic.  Neck:     Thyroid: No thyromegaly.  Cardiovascular:     Rate and Rhythm: Normal rate and regular rhythm.     Pulses: Normal pulses.     Heart sounds: Normal heart sounds.  Pulmonary:     Effort: Pulmonary effort is normal. No respiratory distress.     Breath sounds: Normal breath sounds.  Abdominal:     General: Bowel sounds are normal. There is no distension.     Palpations: Abdomen is soft.     Tenderness: There is no abdominal tenderness.  Musculoskeletal:        General: Normal range of motion.     Cervical back: Neck supple.     Right lower leg: No edema.     Left lower leg: No edema.  Lymphadenopathy:     Cervical: No cervical adenopathy.  Skin:    General: Skin is warm and dry.  Neurological:     Mental Status: She is alert. Mental status is at baseline.  Psychiatric:        Mood and Affect: Mood normal.       ASSESSMENT/ PLAN:  TODAY   1. Major depression recurrent chronic: is worse will resume cymbalta 60 mg daily has failed one wean.   MD is aware of resident's narcotic use and is in agreement with current plan of care. We will attempt to wean resident as appropriate.  Synthia Innocent NP [facility]  Contact [phone] Monday through Friday 8am- 5pm  After hours call [phone]

## 2020-06-18 ENCOUNTER — Encounter: Payer: Self-pay | Admitting: Adult Health

## 2020-06-18 ENCOUNTER — Non-Acute Institutional Stay (SKILLED_NURSING_FACILITY): Payer: Medicare Other | Admitting: Adult Health

## 2020-06-18 DIAGNOSIS — K5909 Other constipation: Secondary | ICD-10-CM

## 2020-06-18 DIAGNOSIS — B009 Herpesviral infection, unspecified: Secondary | ICD-10-CM

## 2020-06-18 DIAGNOSIS — E538 Deficiency of other specified B group vitamins: Secondary | ICD-10-CM | POA: Diagnosis not present

## 2020-06-18 NOTE — Progress Notes (Signed)
Location:    [facility] Room Number: 110/W Place of Service:  SNF (31)   CODE STATUS: Full Code  Allergies  Allergen Reactions  . Lisinopril   . Penicillins Nausea Only    Has patient had a PCN reaction causing immediate rash, facial/tongue/throat swelling, SOB or lightheadedness with hypotension: Yes Has patient had a PCN reaction causing severe rash involving mucus membranes or skin necrosis: No Has patient had a PCN reaction that required hospitalization No Has patient had a PCN reaction occurring within the last 10 years: No If all of the above answers are "NO", then may proceed with Cephalosporin use.   . Sulfa Antibiotics Nausea And Vomiting    Chief Complaint  Patient presents with  . Medical Management of Chronic Issues         Herpes:  Vit b 12 deficiency  Chronic constipation:    HPI:  She is a 79 year old long term resident of this facility being seen for the management of her chronic illnesses: herpes; vit b 12 deficiency; constipation. There are no reports of uncontrolled pain; weight is stable; no reports of constipation or heart burn.   Past Medical History:  Diagnosis Date  . Anxiety   . Bowel obstruction (HCC)   . Burning with urination 01/04/2016  . Diabetes mellitus without complication (HCC)   . Genital herpes   . GERD (gastroesophageal reflux disease)   . Hematuria 01/04/2016  . Herpes 12/22/2015  . Hyperlipidemia   . Hypertension   . LLQ abdominal tenderness 01/04/2016  . Neuropathy   . Pelvic pressure in female 01/04/2016  . Recurrent UTI   . Sciatic nerve disease   . Small bowel obstruction (HCC) 04/27/2016  . Urinary frequency 01/04/2016  . Urticaria     Past Surgical History:  Procedure Laterality Date  . ABDOMINAL HYSTERECTOMY    . APPLICATION OF INTRAOPERATIVE CT SCAN N/A 12/19/2018   Procedure: APPLICATION OF INTRAOPERATIVE CT SCAN;  Surgeon: Maeola Harman, MD;  Location: [facility] OR;  Service: Neurosurgery;  Laterality:  N/A;  . bowel obstruction    . COLON SURGERY     blocked colon  . POSTERIOR CERVICAL FUSION/FORAMINOTOMY N/A 12/19/2018   Procedure: Cervical Seven to Thoracic One Posterior cervical fusion;  Surgeon: Maeola Harman, MD;  Location: [facility] OR;  Service: Neurosurgery;  Laterality: N/A;  Cervical Seven to Thoracic One Posterior cervical fusion    Social History   Socioeconomic History  . Marital status: Single    Spouse name: Not on file  . Number of children: Not on file  . Years of education: Not on file  . Highest education level: Not on file  Occupational History  . Not on file  Tobacco Use  . Smoking status: Never Smoker  . Smokeless tobacco: Current User    Types: Snuff  Vaping Use  . Vaping Use: Never used  Substance and Sexual Activity  . Alcohol use: No  . Drug use: No  . Sexual activity: Not Currently    Birth control/protection: Surgical    Comment: hyst  Other Topics Concern  . Not on file  Social History Narrative  . Not on file   Social Determinants of Health   Financial Resource Strain:   . Difficulty of Paying Living Expenses:   Food Insecurity:   . Worried About Programme researcher, broadcasting/film/video in the Last Year:   . Barista in the Last Year:   Transportation Needs:   . Lack  of Transportation (Medical):   Marland Kitchen Lack of Transportation (Non-Medical):   Physical Activity:   . Days of Exercise per Week:   . Minutes of Exercise per Session:   Stress:   . Feeling of Stress :   Social Connections:   . Frequency of Communication with Friends and Family:   . Frequency of Social Gatherings with Friends and Family:   . Attends Religious Services:   . Active Member of Clubs or Organizations:   . Attends Banker Meetings:   Marland Kitchen Marital Status:   Intimate Partner Violence:   . Fear of Current or Ex-Partner:   . Emotionally Abused:   Marland Kitchen Physically Abused:   . Sexually Abused:    Family History  Problem Relation Age of Onset  . Diabetes Mother   . Heart  disease Brother   . Hypertension Brother   . Diabetes Brother   . Hypertension Daughter   . Diabetes Brother   . Diabetes Brother   . Alzheimer's disease Brother   . Anxiety disorder Daughter       VITAL SIGNS BP 118/70   Pulse 64   Temp 98.3 F (36.8 C) (Oral)   Ht 5' (1.524 m)   Wt 176 lb 9.6 oz (80.1 kg)   BMI 34.49 kg/m   No facility-administered encounter medications on file as of 06/18/2020.   Outpatient Encounter Medications as of 06/18/2020  Medication Sig  . acetaminophen (TYLENOL) 325 MG tablet Take 650 mg by mouth every 8 (eight) hours as needed. As needed for pain or HA. Max 3gm acetaminophen/24 hrs from all sources.  Marland Kitchen acetaminophen (TYLENOL) 650 MG CR tablet Take 650 mg by mouth at bedtime.  Marland Kitchen amLODipine (NORVASC) 10 MG tablet Take 1 tablet (10 mg total) by mouth daily.  Marland Kitchen aspirin 81 MG chewable tablet Chew 81 mg by mouth daily.  Lucilla Lame [place]-Castor Oil (VENELEX) OINT Apply topically. apply to residents buttocks q shift PRN Every Shift - PRN  . DULoxetine (CYMBALTA) 60 MG capsule Take 60 mg by mouth daily.   . Emollient (EUCERIN) lotion Apply topically 4 (four) times daily as needed for dry skin.  Marland Kitchen lamoTRIgine (LAMICTAL) 25 MG tablet Take 25 mg by mouth 2 (two) times daily.   Marland Kitchen linagliptin (TRADJENTA) 5 MG TABS tablet Take 5 mg by mouth daily.  Marland Kitchen loratadine (CLARITIN) 10 MG tablet Take 10 mg by mouth daily.  . Melatonin 5 MG TABS Take 5 mg by mouth at bedtime.  . Menthol, Topical Analgesic, (BIOFREEZE ROLL-ON COLORLESS) 4 % GEL Apply 1 application topically daily as needed (FOR PAIN).   Marland Kitchen metFORMIN (GLUCOPHAGE) 1000 MG tablet Take 1,000 mg by mouth 2 (two) times daily with a meal.   . montelukast (SINGULAIR) 10 MG tablet Take 1 tablet (10 mg total) by mouth at bedtime.  . NON FORMULARY Diet:  Consistent Carbohydrate,  . pantoprazole (PROTONIX) 40 MG tablet Take 40 mg by mouth every morning.  . polyethylene glycol (MIRALAX / GLYCOLAX) packet Take 17 g by mouth  daily.  Marland Kitchen senna-docusate (SENOKOT-S) 8.6-50 MG tablet Take 1 tablet by mouth at bedtime.   . simvastatin (ZOCOR) 10 MG tablet Take 10 mg by mouth at bedtime.   . Sodium Fluoride (PREVIDENT 5000 BOOSTER) 1.1 % PSTE Place 1 application onto teeth at bedtime.   . traMADol (ULTRAM) 50 MG tablet Take 1 tablet (50 mg total) by mouth 2 (two) times daily.  Marland Kitchen triamcinolone (NASACORT ALLERGY 24HR) 55 MCG/ACT AERO nasal inhaler Place  1 spray into the nose daily.  . valACYclovir (VALTREX) 500 MG tablet Take 500 mg by mouth daily.      SIGNIFICANT DIAGNOSTIC EXAMS   PREVIOUS  12-18-19: chest x-ray: Mild right upper lobe and bibasilar airspace opacities which could reflect early pneumonia (including atypical/viral infection).  12-17-18; ct of abdomen and pelvis: Slight edema of the mucosa of the distal antrum of the stomach. This could represent gastritis. Otherwise, benign-appearing abdomen and pelvis.  NO NEW EXAMS.   LABS REVIEWED PREVIOUS;   12-16-19: urine culture: citrobacter youngae 12-18-19: wbc 16.6; hgb 14.7; hct 45.8; mcv 87.1 plt 330; glucose 168; bun 23; creat 0.85; k+ 3.7; na++ 136; ca 9.7; liver normal albumin 4.4 12-20-19: wbc 12.9; hgb 14.8; hct 48.2 ;mcv 90.4 plt 296 glucose 152; bun 33; creat 1.03 ;k+ 3.8; na++ 139; ca 9.7  01-02-20: wbc 14.5; hgb 12.9; hct 40.8 mcv 89.9 plt 171; glucose 128; bun 10; creat 0.80 ;k+ 4.3; na++ 138; ca 9.3 urine culture: multiple species.  02-24-20: chol 147; ldl 71; trig 120 hdl 52 hgb 1c 6.2 04-02-20; urine for micro-albumin <3.0   TODAY  05-14-20: wbc 8.8; hgb 12.4; hct 39.7; mcv 88.4 plt 199; hgb a1c 6.8 vit B 12: 154   Review of Systems  Constitutional: Negative for malaise/fatigue.  Respiratory: Negative for cough and shortness of breath.   Cardiovascular: Negative for chest pain, palpitations and leg swelling.  Gastrointestinal: Negative for abdominal pain, constipation and heartburn.  Musculoskeletal: Negative for back pain, joint pain and myalgias.   Skin: Negative.   Neurological: Negative for dizziness.  Psychiatric/Behavioral: The patient is not nervous/anxious.     Physical Exam Constitutional:      General: She is not in acute distress.    Appearance: She is well-developed. She is not diaphoretic.  Neck:     Thyroid: No thyromegaly.  Cardiovascular:     Rate and Rhythm: Normal rate and regular rhythm.     Pulses: Normal pulses.     Heart sounds: Normal heart sounds.  Pulmonary:     Effort: Pulmonary effort is normal. No respiratory distress.     Breath sounds: Normal breath sounds.  Abdominal:     General: Bowel sounds are normal. There is no distension.     Palpations: Abdomen is soft.     Tenderness: There is no abdominal tenderness.  Musculoskeletal:        General: Normal range of motion.     Cervical back: Neck supple.     Right lower leg: No edema.     Left lower leg: No edema.  Lymphadenopathy:     Cervical: No cervical adenopathy.  Skin:    General: Skin is warm and dry.  Neurological:     Mental Status: She is alert. Mental status is at baseline.  Psychiatric:        Mood and Affect: Mood normal.        ASSESSMENT/ PLAN:  TODAY  1. Herpes: is stable no outbreaks will continue valtrex 500 mg daily   2. 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 Chronic constipation: is stable will continue miralax daily and senna s daily   PREVIOUS   4. Major depression recurrent chronic: is stable will continue cymbalta 60 mg daily lamictal 50 mg twice daily for mood and melatonin 5 mg nightly   5. Hypertension associated with type 2 diabetes mellitus: is stable b/p 118/70 will continue norvasc 10 mg  daily asa 81 mg daily   6. Dyslipidemia associated with type 2 diabetes mellitus is stable will continue zocor 10 mg daily 8  7. Non-insulin dependent type 2 diabetes mellitus is stable hgb a1c 6.8  will continue metformin 1 gm twice daily tradjenta 5 mg daily is on statin and  asa  8. Gastroesophageal reflux disease without esophagitis: is stable will continue protonix 40 mg daily   9. Chronic non-seasonal allergic rhinitis: is stable will continue claritin 10 mg daily and singulair 10 mg daily      MD is aware of resident's narcotic use and is in agreement with current plan of care. We will attempt to wean resident as appropriate.  Synthia Innocent NP [facility]  Contact [phone] Monday through Friday 8am- 5pm  After hours call [phone]

## 2020-06-19 DIAGNOSIS — E538 Deficiency of other specified B group vitamins: Secondary | ICD-10-CM | POA: Insufficient documentation

## 2020-06-26 ENCOUNTER — Other Ambulatory Visit: Payer: Self-pay | Admitting: Adult Health

## 2020-06-26 MED ORDER — TRAMADOL HCL 50 MG PO TABS: 50.0000 mg | ORAL_TABLET | Freq: Two times a day (BID) | ORAL | 0 refills | Status: DC

## 2020-07-20 ENCOUNTER — Other Ambulatory Visit: Payer: Self-pay | Admitting: Adult Health

## 2020-07-20 MED ORDER — TRAMADOL HCL 50 MG PO TABS: 50.0000 mg | ORAL_TABLET | Freq: Two times a day (BID) | ORAL | 0 refills | Status: DC

## 2020-07-21 ENCOUNTER — Encounter: Payer: Self-pay | Admitting: Adult Health

## 2020-07-21 ENCOUNTER — Non-Acute Institutional Stay (SKILLED_NURSING_FACILITY): Payer: Medicare Other | Admitting: Adult Health

## 2020-07-21 DIAGNOSIS — F339 Major depressive disorder, recurrent, unspecified: Secondary | ICD-10-CM

## 2020-07-21 DIAGNOSIS — E1169 Type 2 diabetes mellitus with other specified complication: Secondary | ICD-10-CM

## 2020-07-21 DIAGNOSIS — I10 Essential (primary) hypertension: Secondary | ICD-10-CM

## 2020-07-21 DIAGNOSIS — E1159 Type 2 diabetes mellitus with other circulatory complications: Secondary | ICD-10-CM

## 2020-07-21 DIAGNOSIS — E785 Hyperlipidemia, unspecified: Secondary | ICD-10-CM

## 2020-07-21 DIAGNOSIS — I152 Hypertension secondary to endocrine disorders: Secondary | ICD-10-CM

## 2020-07-21 NOTE — Progress Notes (Signed)
Location:    [facility] Room Number: 110/W Place of Service:  SNF (31)   CODE STATUS: Full Code  Allergies  Allergen Reactions   Lisinopril    Penicillins Nausea Only    Has patient had a PCN reaction causing immediate rash, facial/tongue/throat swelling, SOB or lightheadedness with hypotension: Yes Has patient had a PCN reaction causing severe rash involving mucus membranes or skin necrosis: No Has patient had a PCN reaction that required hospitalization No Has patient had a PCN reaction occurring within the last 10 years: No If all of the above answers are "NO", then may proceed with Cephalosporin use.    Sulfa Antibiotics Nausea And Vomiting    Chief Complaint  Patient presents with   Medical Management of Chronic Issues          Major depression recurrent chronic:   Hypertension associated with type 2 diabetes mellitus:  Dyslipidemia associated with type 2 diabetes mellitus:    HPI:  She is a 79 year old long term resident of this facility being seen for the management of her chronic illnesses: Major depression recurrent chronic:  Hypertension associated with type 2 diabetes mellitus:   Dyslipidemia associated with type 2 diabetes mellitus.   There are no reports of uncontrolled pain; no reports of anxiety or depressive thoughts. No reports of heart burn present.   Past Medical History:  Diagnosis Date   Anxiety    Bowel obstruction (HCC)    Burning with urination 01/04/2016   Diabetes mellitus without complication (HCC)    Genital herpes    GERD (gastroesophageal reflux disease)    Hematuria 01/04/2016   Herpes 12/22/2015   Hyperlipidemia    Hypertension    LLQ abdominal tenderness 01/04/2016   Neuropathy    Pelvic pressure in female 01/04/2016   Recurrent UTI    Sciatic nerve disease    Small bowel obstruction (HCC) 04/27/2016   Urinary frequency 01/04/2016   Urticaria     Past Surgical History:  Procedure Laterality  Date   ABDOMINAL HYSTERECTOMY     APPLICATION OF INTRAOPERATIVE CT SCAN N/A 12/19/2018   Procedure: APPLICATION OF INTRAOPERATIVE CT SCAN;  Surgeon: Maeola HarmanStern, Joseph, MD;  [facility];  Service: Neurosurgery;  Laterality: N/A;   bowel obstruction     COLON SURGERY     blocked colon   POSTERIOR CERVICAL FUSION/FORAMINOTOMY N/A 12/19/2018   Procedure: Cervical Seven to Thoracic One Posterior cervical fusion;  Surgeon: Maeola HarmanStern, Joseph, MD;  [facility];  Service: Neurosurgery;  Laterality: N/A;  Cervical Seven to Thoracic One Posterior cervical fusion    Social History   Socioeconomic History   Marital status: Single    Spouse name: Not on file   Number of children: Not on file   Years of education: Not on file   Highest education level: Not on file  Occupational History   Not on file  Tobacco Use   Smoking status: Never Smoker   Smokeless tobacco: Current User    Types: Snuff  Vaping Use   Vaping Use: Never used  Substance and Sexual Activity   Alcohol use: No   Drug use: No   Sexual activity: Not Currently    Birth control/protection: Surgical    Comment: hyst  Other Topics Concern   Not on file  Social History Narrative   Not on file   Social Determinants of Health   Financial Resource Strain:    Difficulty of Paying Living Expenses:   Food  Insecurity:    Worried About Programme researcher, broadcasting/film/video in the Last Year:    Barista in the Last Year:   Transportation Needs:    Freight forwarder (Medical):    Lack of Transportation (Non-Medical):   Physical Activity:    Days of Exercise per Week:    Minutes of Exercise per Session:   Stress:    Feeling of Stress :   Social Connections:    Frequency of Communication with Friends and Family:    Frequency of Social Gatherings with Friends and Family:    Attends Religious Services:    Active Member of Clubs or Organizations:    Attends Engineer, structural:    Marital Status:    Intimate Partner Violence:    Fear of Current or Ex-Partner:    Emotionally Abused:    Physically Abused:    Sexually Abused:    Family History  Problem Relation Age of Onset   Diabetes Mother    Heart disease Brother    Hypertension Brother    Diabetes Brother    Hypertension Daughter    Diabetes Brother    Diabetes Brother    Alzheimer's disease Brother    Anxiety disorder Daughter       VITAL SIGNS BP (!) 146/78    Pulse 64    Temp 97.7 F (36.5 C) (Oral)    Resp 18    Ht 5' (1.524 m)    Wt 178 lb 6.4 oz (80.9 kg)    BMI 34.84 kg/m   No facility-administered encounter medications on file as of 07/21/2020.   Outpatient Encounter Medications as of 07/21/2020  Medication Sig   acetaminophen (TYLENOL) 325 MG tablet Take 650 mg by mouth every 8 (eight) hours as needed. As needed for pain or HA. Max 3gm acetaminophen/24 hrs from all sources.   acetaminophen (TYLENOL) 650 MG CR tablet Take 650 mg by mouth at bedtime.   amLODipine (NORVASC) 10 MG tablet Take 1 tablet (10 mg total) by mouth daily.   aspirin 81 MG chewable tablet Chew 81 mg by mouth daily.   Balsam [place]-Castor Oil (VENELEX) OINT Apply topically. apply to residents buttocks q shift PRN Every Shift - PRN   cyanocobalamin 1000 MCG tablet Take 1,000 mcg by mouth daily. Special Instructions: for vit B 12 deficiency   DULoxetine (CYMBALTA) 60 MG capsule Take 60 mg by mouth daily.    Emollient (EUCERIN) lotion Apply topically 4 (four) times daily as needed for dry skin.   lamoTRIgine (LAMICTAL) 25 MG tablet Take 25 mg by mouth 2 (two) times daily.    linagliptin (TRADJENTA) 5 MG TABS tablet Take 5 mg by mouth daily.   loratadine (CLARITIN) 10 MG tablet Take 10 mg by mouth daily.   Melatonin 5 MG TABS Take 5 mg by mouth at bedtime.   Menthol, Topical Analgesic, (BIOFREEZE ROLL-ON COLORLESS) 4 % GEL Apply 1 application topically daily as needed (FOR PAIN).    metFORMIN (GLUCOPHAGE) 1000 MG  tablet Take 1,000 mg by mouth 2 (two) times daily with a meal.    montelukast (SINGULAIR) 10 MG tablet Take 1 tablet (10 mg total) by mouth at bedtime.   NON FORMULARY Diet:  Consistent Carbohydrate,   pantoprazole (PROTONIX) 40 MG tablet Take 40 mg by mouth every morning.   polyethylene glycol (MIRALAX / GLYCOLAX) packet Take 17 g by mouth daily.   senna-docusate (SENOKOT-S) 8.6-50 MG tablet Take 1 tablet by mouth at bedtime.  simvastatin (ZOCOR) 10 MG tablet Take 10 mg by mouth at bedtime.    Sodium Fluoride (PREVIDENT 5000 BOOSTER) 1.1 % PSTE Place 1 application onto teeth at bedtime.    traMADol (ULTRAM) 50 MG tablet Take 1 tablet (50 mg total) by mouth 2 (two) times daily.   triamcinolone (NASACORT ALLERGY 24HR) 55 MCG/ACT AERO nasal inhaler Place 1 spray into the nose daily.   valACYclovir (VALTREX) 500 MG tablet Take 500 mg by mouth daily.    [DISCONTINUED] gabapentin (NEURONTIN) 300 MG capsule Take 300 mg by mouth 2 (two) times daily. Reported on 02/03/2016     SIGNIFICANT DIAGNOSTIC EXAMS   PREVIOUS  12-18-19: chest x-ray: Mild right upper lobe and bibasilar airspace opacities which could reflect early pneumonia (including atypical/viral infection).  12-17-18; ct of abdomen and pelvis: Slight edema of the mucosa of the distal antrum of the stomach. This could represent gastritis. Otherwise, benign-appearing abdomen and pelvis.  NO NEW EXAMS.   LABS REVIEWED PREVIOUS;   12-16-19: urine culture: citrobacter youngae 12-18-19: wbc 16.6; hgb 14.7; hct 45.8; mcv 87.1 plt 330; glucose 168; bun 23; creat 0.85; k+ 3.7; na++ 136; ca 9.7; liver normal albumin 4.4 12-20-19: wbc 12.9; hgb 14.8; hct 48.2 ;mcv 90.4 plt 296 glucose 152; bun 33; creat 1.03 ;k+ 3.8; na++ 139; ca 9.7  01-02-20: wbc 14.5; hgb 12.9; hct 40.8 mcv 89.9 plt 171; glucose 128; bun 10; creat 0.80 ;k+ 4.3; na++ 138; ca 9.3 urine culture: multiple species.  02-24-20: chol 147; ldl 71; trig 120 hdl 52 hgb a1c  6.2 04-02-20; urine for micro-albumin <3.0  05-14-20: wbc 8.8; hgb 12.4; hct 39.7; mcv 88.4 plt 199; hgb a1c 6.8 vit B 12: 154   NO NEW LABS.   Review of Systems  Constitutional: Negative for malaise/fatigue.  Respiratory: Negative for cough and shortness of breath.   Cardiovascular: Negative for chest pain, palpitations and leg swelling.  Gastrointestinal: Negative for abdominal pain, constipation and heartburn.  Musculoskeletal: Negative for back pain, joint pain and myalgias.  Skin: Negative.   Neurological: Negative for dizziness.  Psychiatric/Behavioral: The patient is not nervous/anxious.     Physical Exam Constitutional:      General: She is not in acute distress.    Appearance: She is well-developed. She is not diaphoretic.  Neck:     Thyroid: No thyromegaly.  Cardiovascular:     Rate and Rhythm: Normal rate and regular rhythm.     Pulses: Normal pulses.     Heart sounds: Normal heart sounds.  Pulmonary:     Effort: Pulmonary effort is normal. No respiratory distress.     Breath sounds: Normal breath sounds.  Abdominal:     General: Bowel sounds are normal. There is no distension.     Palpations: Abdomen is soft.     Tenderness: There is no abdominal tenderness.  Musculoskeletal:        General: Normal range of motion.     Cervical back: Neck supple.     Right lower leg: No edema.     Left lower leg: No edema.  Lymphadenopathy:     Cervical: No cervical adenopathy.  Skin:    General: Skin is warm and dry.  Neurological:     Mental Status: She is alert. Mental status is at baseline.  Psychiatric:        Mood and Affect: Mood normal.       ASSESSMENT/ PLAN:  TODAY  1. Major depression recurrent chronic: is stable will continue cymbalta 60  mg daily lamictal 50 mg twice daily for mood and melatonin 5 mg nightly   2. Hypertension associated with type 2 diabetes mellitus: is stable b/p 146/78 will continue norvasc 10 mg daily asa 81 mg daily   3. Dyslipidemia  associated with type 2 diabetes mellitus: is stable will continue zocor 10 mg daily    PREVIOUS   4. Non-insulin dependent type 2 diabetes mellitus is stable hgb a1c 6.8  will continue metformin 1 gm twice daily tradjenta 5 mg daily is on statin and asa  5. Gastroesophageal reflux disease without esophagitis: is stable will continue protonix 40 mg daily   6. Chronic non-seasonal allergic rhinitis: is stable will continue claritin 10 mg daily and singulair 10 mg daily   7. Herpes: is stable no outbreaks will continue valtrex 500 mg daily   8. 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 Chronic constipation: is stable will continue miralax daily and senna s daily         MD is aware of resident's narcotic use and is in agreement with current plan of care. We will attempt to wean resident as appropriate.  Synthia Innocent NP [facility]  Contact [phone] Monday through Friday 8am- 5pm  After hours call [phone]

## 2020-08-07 ENCOUNTER — Non-Acute Institutional Stay: Payer: Self-pay | Admitting: Adult Health

## 2020-08-07 ENCOUNTER — Encounter: Payer: Self-pay | Admitting: Adult Health

## 2020-08-07 DIAGNOSIS — F339 Major depressive disorder, recurrent, unspecified: Secondary | ICD-10-CM

## 2020-08-07 DIAGNOSIS — R52 Pain, unspecified: Secondary | ICD-10-CM

## 2020-08-07 DIAGNOSIS — G9520 Unspecified cord compression: Secondary | ICD-10-CM

## 2020-08-10 NOTE — Progress Notes (Signed)
Location:   [facility] Room Number: 110/W Place of Service:  SNF (31)   CODE STATUS: full code   Allergies  Allergen Reactions  . Lisinopril   . Penicillins Nausea Only    Has patient had a PCN reaction causing immediate rash, facial/tongue/throat swelling, SOB or lightheadedness with hypotension: Yes Has patient had a PCN reaction causing severe rash involving mucus membranes or skin necrosis: No Has patient had a PCN reaction that required hospitalization No Has patient had a PCN reaction occurring within the last 10 years: No If all of the above answers are "NO", then may proceed with Cephalosporin use.   . Sulfa Antibiotics Nausea And Vomiting    Chief Complaint  Patient presents with  . Acute Visit    medication review     HPI:  She is presently taking ultram 50 mg twice daily; lamictal 25 mg twice daily; cymbalta 60 mg daily (failed one wean). There are no reports of uncontrolled pain. No reports of anxious or depressive thoughts. She is doing well on an emotional level. There are no reports of agitation present. She is due for a dose reduction.   Past Medical History:  Diagnosis Date  . Anxiety   . Bowel obstruction (HCC)   . Burning with urination 01/04/2016  . Diabetes mellitus without complication (HCC)   . Genital herpes   . GERD (gastroesophageal reflux disease)   . Hematuria 01/04/2016  . Herpes 12/22/2015  . Hyperlipidemia   . Hypertension   . LLQ abdominal tenderness 01/04/2016  . Neuropathy   . Pelvic pressure in female 01/04/2016  . Recurrent UTI   . Sciatic nerve disease   . Small bowel obstruction (HCC) 04/27/2016  . Urinary frequency 01/04/2016  . Urticaria     Past Surgical History:  Procedure Laterality Date  . ABDOMINAL HYSTERECTOMY    . APPLICATION OF INTRAOPERATIVE CT SCAN N/A 12/19/2018   Procedure: APPLICATION OF INTRAOPERATIVE CT SCAN;  Surgeon: Maeola Harman, MD;  Location: [facility] OR;  Service: Neurosurgery;  Laterality: N/A;  .  bowel obstruction    . COLON SURGERY     blocked colon  . POSTERIOR CERVICAL FUSION/FORAMINOTOMY N/A 12/19/2018   Procedure: Cervical Seven to Thoracic One Posterior cervical fusion;  Surgeon: Maeola Harman, MD;  Location: [facility] OR;  Service: Neurosurgery;  Laterality: N/A;  Cervical Seven to Thoracic One Posterior cervical fusion    Social History   Socioeconomic History  . Marital status: Single    Spouse name: Not on file  . Number of children: Not on file  . Years of education: Not on file  . Highest education level: Not on file  Occupational History  . Not on file  Tobacco Use  . Smoking status: Never Smoker  . Smokeless tobacco: Current User    Types: Snuff  Vaping Use  . Vaping Use: Never used  Substance and Sexual Activity  . Alcohol use: No  . Drug use: No  . Sexual activity: Not Currently    Birth control/protection: Surgical    Comment: hyst  Other Topics Concern  . Not on file  Social History Narrative  . Not on file   Social Determinants of Health   Financial Resource Strain:   . Difficulty of Paying Living Expenses: Not on file  Food Insecurity:   . Worried About Programme researcher, broadcasting/film/video in the Last Year: Not on file  . Ran Out of Food in the Last Year: Not on file  Transportation Needs:   .  Lack of Transportation (Medical): Not on file  . Lack of Transportation (Non-Medical): Not on file  Physical Activity:   . Days of Exercise per Week: Not on file  . Minutes of Exercise per Session: Not on file  Stress:   . Feeling of Stress : Not on file  Social Connections:   . Frequency of Communication with Friends and Family: Not on file  . Frequency of Social Gatherings with Friends and Family: Not on file  . Attends Religious Services: Not on file  . Active Member of Clubs or Organizations: Not on file  . Attends Banker Meetings: Not on file  . Marital Status: Not on file  Intimate Partner Violence:   . Fear of Current or Ex-Partner: Not on file  .  Emotionally Abused: Not on file  . Physically Abused: Not on file  . Sexually Abused: Not on file   Family History  Problem Relation Age of Onset  . Diabetes Mother   . Heart disease Brother   . Hypertension Brother   . Diabetes Brother   . Hypertension Daughter   . Diabetes Brother   . Diabetes Brother   . Alzheimer's disease Brother   . Anxiety disorder Daughter       VITAL SIGNS BP (!) 169/78   Pulse 71   Temp 98.1 F (36.7 C) (Oral)   Resp 18   Ht 5' (1.524 m)   Wt 178 lb 6.4 oz (80.9 kg)   BMI 34.84 kg/m   No facility-administered encounter medications on file as of 08/07/2020.   Outpatient Encounter Medications as of 08/07/2020  Medication Sig  . acetaminophen (TYLENOL) 325 MG tablet Take 650 mg by mouth every 8 (eight) hours as needed. As needed for pain or HA. Max 3gm acetaminophen/24 hrs from all sources.  Marland Kitchen acetaminophen (TYLENOL) 650 MG CR tablet Take 650 mg by mouth at bedtime.  Marland Kitchen amLODipine (NORVASC) 10 MG tablet Take 1 tablet (10 mg total) by mouth daily.  Marland Kitchen aspirin 81 MG chewable tablet Chew 81 mg by mouth daily.  Lucilla Lame [place]-Castor Oil (VENELEX) OINT Apply topically. apply to residents buttocks q shift PRN Every Shift - PRN  . cyanocobalamin 1000 MCG tablet Take 1,000 mcg by mouth daily. Special Instructions: for vit B 12 deficiency  . DULoxetine (CYMBALTA) 60 MG capsule Take 60 mg by mouth daily.   . Emollient (EUCERIN) lotion Apply topically 4 (four) times daily as needed for dry skin.  Marland Kitchen lamoTRIgine (LAMICTAL) 25 MG tablet Take 25 mg by mouth 2 (two) times daily.   Marland Kitchen linagliptin (TRADJENTA) 5 MG TABS tablet Take 5 mg by mouth daily.  Marland Kitchen loratadine (CLARITIN) 10 MG tablet Take 10 mg by mouth daily.  . Melatonin 5 MG TABS Take 5 mg by mouth at bedtime.  . Menthol, Topical Analgesic, (BIOFREEZE ROLL-ON COLORLESS) 4 % GEL Apply 1 application topically daily as needed (FOR PAIN).   Marland Kitchen metFORMIN (GLUCOPHAGE) 1000 MG tablet Take 1,000 mg by mouth 2 (two)  times daily with a meal.   . montelukast (SINGULAIR) 10 MG tablet Take 1 tablet (10 mg total) by mouth at bedtime.  . NON FORMULARY Diet:  Consistent Carbohydrate,  . pantoprazole (PROTONIX) 40 MG tablet Take 40 mg by mouth every morning.  . polyethylene glycol (MIRALAX / GLYCOLAX) packet Take 17 g by mouth daily.  Marland Kitchen senna-docusate (SENOKOT-S) 8.6-50 MG tablet Take 1 tablet by mouth at bedtime.   . simvastatin (ZOCOR) 10 MG tablet Take 10  mg by mouth at bedtime.   . Sodium Fluoride (PREVIDENT 5000 BOOSTER) 1.1 % PSTE Place 1 application onto teeth at bedtime.   . traMADol (ULTRAM) 50 MG tablet Take 1 tablet (50 mg total) by mouth 2 (two) times daily.  Marland Kitchen. triamcinolone (NASACORT ALLERGY 24HR) 55 MCG/ACT AERO nasal inhaler Place 1 spray into the nose daily.  . valACYclovir (VALTREX) 500 MG tablet Take 500 mg by mouth daily.   . [DISCONTINUED] gabapentin (NEURONTIN) 300 MG capsule Take 300 mg by mouth 2 (two) times daily. Reported on 02/03/2016     SIGNIFICANT DIAGNOSTIC EXAMS  PREVIOUS  12-18-19: chest x-ray: Mild right upper lobe and bibasilar airspace opacities which could reflect early pneumonia (including atypical/viral infection).  12-17-18; ct of abdomen and pelvis: Slight edema of the mucosa of the distal antrum of the stomach. This could represent gastritis. Otherwise, benign-appearing abdomen and pelvis.  NO NEW EXAMS.   LABS REVIEWED PREVIOUS;   12-16-19: urine culture: citrobacter youngae 12-18-19: wbc 16.6; hgb 14.7; hct 45.8; mcv 87.1 plt 330; glucose 168; bun 23; creat 0.85; k+ 3.7; na++ 136; ca 9.7; liver normal albumin 4.4 12-20-19: wbc 12.9; hgb 14.8; hct 48.2 ;mcv 90.4 plt 296 glucose 152; bun 33; creat 1.03 ;k+ 3.8; na++ 139; ca 9.7  01-02-20: wbc 14.5; hgb 12.9; hct 40.8 mcv 89.9 plt 171; glucose 128; bun 10; creat 0.80 ;k+ 4.3; na++ 138; ca 9.3 urine culture: multiple species.  02-24-20: chol 147; ldl 71; trig 120 hdl 52 hgb a1c 6.2 04-02-20; urine for micro-albumin <3.0   05-14-20: wbc 8.8; hgb 12.4; hct 39.7; mcv 88.4 plt 199; hgb a1c 6.8 vit B 12: 154   NO NEW LABS.   Review of Systems  Constitutional: Negative for malaise/fatigue.  Respiratory: Negative for cough and shortness of breath.   Cardiovascular: Negative for chest pain, palpitations and leg swelling.  Gastrointestinal: Negative for abdominal pain, constipation and heartburn.  Musculoskeletal: Negative for back pain, joint pain and myalgias.  Skin: Negative.   Neurological: Negative for dizziness.  Psychiatric/Behavioral: The patient is not nervous/anxious.     Physical Exam Constitutional:      General: She is not in acute distress.    Appearance: She is well-developed. She is not diaphoretic.  Neck:     Thyroid: No thyromegaly.  Cardiovascular:     Rate and Rhythm: Normal rate and regular rhythm.     Pulses: Normal pulses.     Heart sounds: Normal heart sounds.  Pulmonary:     Effort: Pulmonary effort is normal. No respiratory distress.     Breath sounds: Normal breath sounds.  Abdominal:     General: Bowel sounds are normal. There is no distension.     Palpations: Abdomen is soft.     Tenderness: There is no abdominal tenderness.  Musculoskeletal:        General: Normal range of motion.     Cervical back: Neck supple.     Right lower leg: No edema.     Left lower leg: No edema.  Lymphadenopathy:     Cervical: No cervical adenopathy.  Skin:    General: Skin is warm and dry.  Neurological:     Mental Status: She is alert. Mental status is at baseline.  Psychiatric:        Mood and Affect: Mood normal.       ASSESSMENT/ PLAN:  TODAY  1. Major depression recurrent chronic 2. Cord compression 3. Chronic generalized pain  Will continue ultram 50 mg twice  daily Will continue cymbalta 60 mg daily  Will lower lamictal to 25 mg daily  Will continue to monitor her status.   MD is aware of resident's narcotic use and is in agreement with current plan of care. We will  attempt to wean resident as appropriate.  Synthia Innocent NP [facility]  Contact [phone] Monday through Friday 8am- 5pm  After hours call [phone]

## 2020-08-11 ENCOUNTER — Other Ambulatory Visit: Payer: Self-pay | Admitting: Adult Health

## 2020-08-11 MED ORDER — TRAMADOL HCL 50 MG PO TABS: 50.0000 mg | ORAL_TABLET | Freq: Two times a day (BID) | ORAL | 0 refills | Status: DC

## 2020-08-24 ENCOUNTER — Non-Acute Institutional Stay (SKILLED_NURSING_FACILITY): Payer: Medicare Other | Admitting: Adult Health

## 2020-08-24 ENCOUNTER — Encounter: Payer: Self-pay | Admitting: Adult Health

## 2020-08-24 DIAGNOSIS — K219 Gastro-esophageal reflux disease without esophagitis: Secondary | ICD-10-CM | POA: Diagnosis not present

## 2020-08-24 DIAGNOSIS — E119 Type 2 diabetes mellitus without complications: Secondary | ICD-10-CM | POA: Diagnosis not present

## 2020-08-24 DIAGNOSIS — J3089 Other allergic rhinitis: Secondary | ICD-10-CM | POA: Diagnosis not present

## 2020-08-24 NOTE — Progress Notes (Signed)
Location:    [facility] Room Number: 110/W Place of Service:  SNF (31)   CODE STATUS: Full Code  Allergies  Allergen Reactions  . Lisinopril   . Penicillins Nausea Only    Has patient had a PCN reaction causing immediate rash, facial/tongue/throat swelling, SOB or lightheadedness with hypotension: Yes Has patient had a PCN reaction causing severe rash involving mucus membranes or skin necrosis: No Has patient had a PCN reaction that required hospitalization No Has patient had a PCN reaction occurring within the last 10 years: No If all of the above answers are "NO", then may proceed with Cephalosporin use.   . Sulfa Antibiotics Nausea And Vomiting    Chief Complaint  Patient presents with  . Medical Management of Chronic Issues            Non-insulin dependent type 2 diabetes mellitus:  Gastroesophageal reflux disease without esophagitis:   Chronic non-seasonal allergic rhinitis:    HPI:  She is a 79 year old long term resident of this facility being seen for the management of her chronic illnesses: Non-insulin dependent type 2 diabetes mellitus:  Gastroesophageal reflux disease without esophagitis:   Chronic non-seasonal allergic rhinitis. There are no reports of uncontrolled pain; no reports of heart burn; no reports of sinus congestion.   Past Medical History:  Diagnosis Date  . Anxiety   . Bowel obstruction (HCC)   . Burning with urination 01/04/2016  . Diabetes mellitus without complication (HCC)   . Genital herpes   . GERD (gastroesophageal reflux disease)   . Hematuria 01/04/2016  . Herpes 12/22/2015  . Hyperlipidemia   . Hypertension   . LLQ abdominal tenderness 01/04/2016  . Neuropathy   . Pelvic pressure in female 01/04/2016  . Recurrent UTI   . Sciatic nerve disease   . Small bowel obstruction (HCC) 04/27/2016  . Urinary frequency 01/04/2016  . Urticaria     Past Surgical History:  Procedure Laterality Date  . ABDOMINAL HYSTERECTOMY      . APPLICATION OF INTRAOPERATIVE CT SCAN N/A 12/19/2018   Procedure: APPLICATION OF INTRAOPERATIVE CT SCAN;  Surgeon: Maeola Harman, MD;  Location: [facility] OR;  Service: Neurosurgery;  Laterality: N/A;  . bowel obstruction    . COLON SURGERY     blocked colon  . POSTERIOR CERVICAL FUSION/FORAMINOTOMY N/A 12/19/2018   Procedure: Cervical Seven to Thoracic One Posterior cervical fusion;  Surgeon: Maeola Harman, MD;  Location: [facility] OR;  Service: Neurosurgery;  Laterality: N/A;  Cervical Seven to Thoracic One Posterior cervical fusion    Social History   Socioeconomic History  . Marital status: Single    Spouse name: Not on file  . Number of children: Not on file  . Years of education: Not on file  . Highest education level: Not on file  Occupational History  . Not on file  Tobacco Use  . Smoking status: Never Smoker  . Smokeless tobacco: Current User    Types: Snuff  Vaping Use  . Vaping Use: Never used  Substance and Sexual Activity  . Alcohol use: No  . Drug use: No  . Sexual activity: Not Currently    Birth control/protection: Surgical    Comment: hyst  Other Topics Concern  . Not on file  Social History Narrative  . Not on file   Social Determinants of Health   Financial Resource Strain:   . Difficulty of Paying Living Expenses: Not on file  Food Insecurity:   . Worried About  Running Out of Food in the Last Year: Not on file  . Ran Out of Food in the Last Year: Not on file  Transportation Needs:   . Lack of Transportation (Medical): Not on file  . Lack of Transportation (Non-Medical): Not on file  Physical Activity:   . Days of Exercise per Week: Not on file  . Minutes of Exercise per Session: Not on file  Stress:   . Feeling of Stress : Not on file  Social Connections:   . Frequency of Communication with Friends and Family: Not on file  . Frequency of Social Gatherings with Friends and Family: Not on file  . Attends Religious Services: Not on file  . Active Member of  Clubs or Organizations: Not on file  . Attends BankerClub or Organization Meetings: Not on file  . Marital Status: Not on file  Intimate Partner Violence:   . Fear of Current or Ex-Partner: Not on file  . Emotionally Abused: Not on file  . Physically Abused: Not on file  . Sexually Abused: Not on file   Family History  Problem Relation Age of Onset  . Diabetes Mother   . Heart disease Brother   . Hypertension Brother   . Diabetes Brother   . Hypertension Daughter   . Diabetes Brother   . Diabetes Brother   . Alzheimer's disease Brother   . Anxiety disorder Daughter       VITAL SIGNS BP 136/85   Pulse 82   Temp 97.9 F (36.6 C) (Oral)   Resp 18   Ht 5' (1.524 m)   Wt 178 lb 12.8 oz (81.1 kg)   BMI 34.92 kg/m   No facility-administered encounter medications on file as of 08/24/2020.   Outpatient Encounter Medications as of 08/24/2020  Medication Sig  . acetaminophen (TYLENOL) 325 MG tablet Take 650 mg by mouth every 8 (eight) hours as needed. As needed for pain or HA. Max 3gm acetaminophen/24 hrs from all sources.  Marland Kitchen. acetaminophen (TYLENOL) 650 MG CR tablet Take 650 mg by mouth at bedtime.  Marland Kitchen. amLODipine (NORVASC) 10 MG tablet Take 1 tablet (10 mg total) by mouth daily.  Marland Kitchen. aspirin 81 MG chewable tablet Chew 81 mg by mouth daily.  Lucilla Lame. Balsam [place]-Castor Oil (VENELEX) OINT Apply topically. apply to residents buttocks q shift PRN Every Shift - PRN  . cyanocobalamin 1000 MCG tablet Take 1,000 mcg by mouth daily. Special Instructions: for vit B 12 deficiency  . DULoxetine (CYMBALTA) 60 MG capsule Take 60 mg by mouth daily.   . Emollient (EUCERIN) lotion Apply topically 4 (four) times daily as needed for dry skin.  Marland Kitchen. lamoTRIgine (LAMICTAL) 25 MG tablet Take 25 mg by mouth daily.   Marland Kitchen. linagliptin (TRADJENTA) 5 MG TABS tablet Take 5 mg by mouth daily.  Marland Kitchen. loratadine (CLARITIN) 10 MG tablet Take 10 mg by mouth daily.  . Melatonin 5 MG TABS Take 5 mg by mouth at bedtime.  . Menthol,  Topical Analgesic, (BIOFREEZE ROLL-ON COLORLESS) 4 % GEL Apply 1 application topically daily as needed (FOR PAIN).   Marland Kitchen. metFORMIN (GLUCOPHAGE) 1000 MG tablet Take 1,000 mg by mouth 2 (two) times daily with a meal.   . montelukast (SINGULAIR) 10 MG tablet Take 1 tablet (10 mg total) by mouth at bedtime.  . NON FORMULARY Diet:  Consistent Carbohydrate,  . pantoprazole (PROTONIX) 40 MG tablet Take 40 mg by mouth every morning.  . polyethylene glycol (MIRALAX / GLYCOLAX) packet Take 17 g by  mouth daily.  Marland Kitchen senna-docusate (SENOKOT-S) 8.6-50 MG tablet Take 1 tablet by mouth at bedtime.   . simvastatin (ZOCOR) 10 MG tablet Take 10 mg by mouth at bedtime.   . Sodium Fluoride (PREVIDENT 5000 BOOSTER) 1.1 % PSTE Place 1 application onto teeth at bedtime.   . traMADol (ULTRAM) 50 MG tablet Take 1 tablet (50 mg total) by mouth 2 (two) times daily.  Marland Kitchen triamcinolone (NASACORT ALLERGY 24HR) 55 MCG/ACT AERO nasal inhaler Place 1 spray into the nose daily.  . valACYclovir (VALTREX) 500 MG tablet Take 500 mg by mouth daily.   . [DISCONTINUED] gabapentin (NEURONTIN) 300 MG capsule Take 300 mg by mouth 2 (two) times daily. Reported on 02/03/2016     SIGNIFICANT DIAGNOSTIC EXAMS   PREVIOUS  12-18-19: chest x-ray: Mild right upper lobe and bibasilar airspace opacities which could reflect early pneumonia (including atypical/viral infection).  12-17-18; ct of abdomen and pelvis: Slight edema of the mucosa of the distal antrum of the stomach. This could represent gastritis. Otherwise, benign-appearing abdomen and pelvis.  TODAY  07-16-20 DEXA: t score 0.825   LABS REVIEWED PREVIOUS;   12-16-19: urine culture: citrobacter youngae 12-18-19: wbc 16.6; hgb 14.7; hct 45.8; mcv 87.1 plt 330; glucose 168; bun 23; creat 0.85; k+ 3.7; na++ 136; ca 9.7; liver normal albumin 4.4 12-20-19: wbc 12.9; hgb 14.8; hct 48.2 ;mcv 90.4 plt 296 glucose 152; bun 33; creat 1.03 ;k+ 3.8; na++ 139; ca 9.7  01-02-20: wbc 14.5; hgb 12.9; hct 40.8  mcv 89.9 plt 171; glucose 128; bun 10; creat 0.80 ;k+ 4.3; na++ 138; ca 9.3 urine culture: multiple species.  02-24-20: chol 147; ldl 71; trig 120 hdl 52 hgb a1c 6.2 04-02-20; urine for micro-albumin <3.0  05-14-20: wbc 8.8; hgb 12.4; hct 39.7; mcv 88.4 plt 199; hgb a1c 6.8 vit B 12: 154   NO NEW LABS.   Review of Systems  Constitutional: Negative for malaise/fatigue.  Respiratory: Negative for cough and shortness of breath.   Cardiovascular: Negative for chest pain, palpitations and leg swelling.  Gastrointestinal: Negative for abdominal pain, constipation and heartburn.  Musculoskeletal: Negative for back pain, joint pain and myalgias.  Skin: Negative.   Neurological: Negative for dizziness.  Psychiatric/Behavioral: The patient is not nervous/anxious.      Physical Exam Constitutional:      General: She is not in acute distress.    Appearance: She is well-developed. She is not diaphoretic.  Neck:     Thyroid: No thyromegaly.  Cardiovascular:     Rate and Rhythm: Normal rate and regular rhythm.     Pulses: Normal pulses.     Heart sounds: Normal heart sounds.  Pulmonary:     Effort: Pulmonary effort is normal. No respiratory distress.     Breath sounds: Normal breath sounds.  Abdominal:     General: Bowel sounds are normal. There is no distension.     Palpations: Abdomen is soft.     Tenderness: There is no abdominal tenderness.  Musculoskeletal:        General: Normal range of motion.     Cervical back: Neck supple.     Right lower leg: No edema.     Left lower leg: No edema.  Lymphadenopathy:     Cervical: No cervical adenopathy.  Skin:    General: Skin is warm and dry.  Neurological:     Mental Status: She is alert. Mental status is at baseline.  Psychiatric:        Mood and Affect:  Mood normal.        ASSESSMENT/ PLAN:  TODAY  1. Non-insulin dependent type 2 diabetes mellitus: is stable hgb a1c 6.8 will continue metformin 1 gm twice daily and tradjenta 5 mg  daily is on statin and asa  2. Gastroesophageal reflux disease without esophagitis: is stable will continue protonix 40 mg daily   3. Chronic non-seasonal allergic rhinitis: is stable will continue claritin 10 mg daily and singulair 10 mg daily    PREVIOUS   4. Herpes: is stable no outbreaks will continue valtrex 500 mg daily   5. Vit b 12 deficiency level is 154 will conitnue vit b 12 1000 mcg daily   6. Chronic constipation: is stable will continue miralax daily and senna s daily   7. Major depression recurrent chronic: is stable will continue cymbalta 60 mg daily lamictal 50 mg twice daily for mood and melatonin 5 mg nightly   8. Hypertension associated with type 2 diabetes mellitus: is stable b/p 136/85 will continue norvasc 10 mg daily asa 81 mg daily   9. Dyslipidemia associated with type 2 diabetes mellitus: is stable LDL 71  will continue zocor 10 mg daily            MD is aware of resident's narcotic use and is in agreement with current plan of care. We will attempt to wean resident as appropriate.  Synthia Innocent NP [facility]  Contact [phone] Monday through Friday 8am- 5pm  After hours call [phone]

## 2020-08-27 ENCOUNTER — Encounter: Payer: Self-pay | Admitting: Adult Health

## 2020-08-27 ENCOUNTER — Non-Acute Institutional Stay (SKILLED_NURSING_FACILITY): Payer: Medicare Other | Admitting: Adult Health

## 2020-08-27 ENCOUNTER — Other Ambulatory Visit (HOSPITAL_COMMUNITY)
Admission: RE | Admit: 2020-08-27 | Discharge: 2020-08-27 | Disposition: A | Discharge status: Home / Self Care | Payer: Medicare Other | Source: Skilled Nursing Facility | Attending: Adult Health | Admitting: Adult Health

## 2020-08-27 DIAGNOSIS — E119 Type 2 diabetes mellitus without complications: Secondary | ICD-10-CM | POA: Diagnosis present

## 2020-08-27 DIAGNOSIS — I10 Essential (primary) hypertension: Secondary | ICD-10-CM

## 2020-08-27 DIAGNOSIS — E1159 Type 2 diabetes mellitus with other circulatory complications: Secondary | ICD-10-CM | POA: Diagnosis not present

## 2020-08-27 DIAGNOSIS — G9520 Unspecified cord compression: Secondary | ICD-10-CM

## 2020-08-27 DIAGNOSIS — I152 Hypertension secondary to endocrine disorders: Secondary | ICD-10-CM

## 2020-08-27 DIAGNOSIS — F339 Major depressive disorder, recurrent, unspecified: Secondary | ICD-10-CM | POA: Diagnosis not present

## 2020-08-27 LAB — HEPATITIS C ANTIBODY: HCV Ab: NONREACTIVE

## 2020-08-27 NOTE — Progress Notes (Signed)
Location:    [facility] Room Number: 110/W Place of Service:  SNF (31)   CODE STATUS: Full Code  Allergies  Allergen Reactions   Lisinopril    Penicillins Nausea Only    Has patient had a PCN reaction causing immediate rash, facial/tongue/throat swelling, SOB or lightheadedness with hypotension: Yes Has patient had a PCN reaction causing severe rash involving mucus membranes or skin necrosis: No Has patient had a PCN reaction that required hospitalization No Has patient had a PCN reaction occurring within the last 10 years: No If all of the above answers are "NO", then may proceed with Cephalosporin use.    Sulfa Antibiotics Nausea And Vomiting    Chief Complaint  Patient presents with   Acute Visit    Care Plan Meeting    HPI:  We have come together for her care plan meeting. BIMS 15/15 mood 9/30. She has gained 3.8 pounds in the past quarter. She has had good appetite. There are no recent falls she is independent to supervision for her adls. She is frequently to occasionally incontinent of bladder and bowel. There are no reports of uncontrolled pain; no reports of anxiety or agitation. She continues to be followed for her chronic illnesses including: Major depression recurrent chronic   Hypertension associated with type 2 diabetes mellitus Cord compression  Past Medical History:  Diagnosis Date   Anxiety    Bowel obstruction (HCC)    Burning with urination 01/04/2016   Diabetes mellitus without complication (HCC)    Genital herpes    GERD (gastroesophageal reflux disease)    Hematuria 01/04/2016   Herpes 12/22/2015   Hyperlipidemia    Hypertension    LLQ abdominal tenderness 01/04/2016   Neuropathy    Pelvic pressure in female 01/04/2016   Recurrent UTI    Sciatic nerve disease    Small bowel obstruction (HCC) 04/27/2016   Urinary frequency 01/04/2016   Urticaria     Past Surgical History:  Procedure Laterality Date    ABDOMINAL HYSTERECTOMY     APPLICATION OF INTRAOPERATIVE CT SCAN N/A 12/19/2018   Procedure: APPLICATION OF INTRAOPERATIVE CT SCAN;  Surgeon: Maeola Harman, MD;  Location: [facility] OR;  Service: Neurosurgery;  Laterality: N/A;   bowel obstruction     COLON SURGERY     blocked colon   POSTERIOR CERVICAL FUSION/FORAMINOTOMY N/A 12/19/2018   Procedure: Cervical Seven to Thoracic One Posterior cervical fusion;  Surgeon: Maeola Harman, MD;  Location: [facility] OR;  Service: Neurosurgery;  Laterality: N/A;  Cervical Seven to Thoracic One Posterior cervical fusion    Social History   Socioeconomic History   Marital status: Single    Spouse name: Not on file   Number of children: Not on file   Years of education: Not on file   Highest education level: Not on file  Occupational History   Not on file  Tobacco Use   Smoking status: Never Smoker   Smokeless tobacco: Current User    Types: Snuff  Vaping Use   Vaping Use: Never used  Substance and Sexual Activity   Alcohol use: No   Drug use: No   Sexual activity: Not Currently    Birth control/protection: Surgical    Comment: hyst  Other Topics Concern   Not on file  Social History Narrative   Not on file   Social Determinants of Health   Financial Resource Strain:    Difficulty of Paying Living Expenses: Not on file  Food Insecurity:  Worried About Programme researcher, broadcasting/film/videounning Out of Food in the Last Year: Not on file   [employer] Out of Food in the Last Year: Not on file  Transportation Needs:    Lack of Transportation (Medical): Not on file   Lack of Transportation (Non-Medical): Not on file  Physical Activity:    Days of Exercise per Week: Not on file   Minutes of Exercise per Session: Not on file  Stress:    Feeling of Stress : Not on file  Social Connections:    Frequency of Communication with Friends and Family: Not on file   Frequency of Social Gatherings with Friends and Family: Not on file   Attends Religious Services: Not on  file   Active Member of Clubs or Organizations: Not on file   Attends BankerClub or Organization Meetings: Not on file   Marital Status: Not on file  Intimate Partner Violence:    Fear of Current or Ex-Partner: Not on file   Emotionally Abused: Not on file   Physically Abused: Not on file   Sexually Abused: Not on file   Family History  Problem Relation Age of Onset   Diabetes Mother    Heart disease Brother    Hypertension Brother    Diabetes Brother    Hypertension Daughter    Diabetes Brother    Diabetes Brother    Alzheimer's disease Brother    Anxiety disorder Daughter       VITAL SIGNS BP 136/85    Pulse 82    Temp 98.1 F (36.7 C) (Oral)    Resp 18    Ht 5' (1.524 m)    Wt 178 lb 12.8 oz (81.1 kg)    BMI 34.92 kg/m   No facility-administered encounter medications on file as of 08/27/2020.   Outpatient Encounter Medications as of 08/27/2020  Medication Sig   acetaminophen (TYLENOL) 325 MG tablet Take 650 mg by mouth every 8 (eight) hours as needed. As needed for pain or HA. Max 3gm acetaminophen/24 hrs from all sources.   acetaminophen (TYLENOL) 650 MG CR tablet Take 650 mg by mouth at bedtime.   amLODipine (NORVASC) 10 MG tablet Take 1 tablet (10 mg total) by mouth daily.   aspirin 81 MG chewable tablet Chew 81 mg by mouth daily.   Balsam [place]-Castor Oil (VENELEX) OINT Apply topically. apply to residents buttocks q shift PRN Every Shift - PRN   cyanocobalamin 1000 MCG tablet Take 1,000 mcg by mouth daily. Special Instructions: for vit B 12 deficiency   DULoxetine (CYMBALTA) 60 MG capsule Take 60 mg by mouth daily.    Emollient (EUCERIN) lotion Apply topically 4 (four) times daily as needed for dry skin.   lamoTRIgine (LAMICTAL) 25 MG tablet Take 25 mg by mouth daily.    linagliptin (TRADJENTA) 5 MG TABS tablet Take 5 mg by mouth daily.   loratadine (CLARITIN) 10 MG tablet Take 10 mg by mouth daily.   Melatonin 5 MG TABS Take 5 mg by mouth at  bedtime.   Menthol, Topical Analgesic, (BIOFREEZE ROLL-ON COLORLESS) 4 % GEL Apply 1 application topically daily as needed (FOR PAIN).    metFORMIN (GLUCOPHAGE) 1000 MG tablet Take 1,000 mg by mouth 2 (two) times daily with a meal.    montelukast (SINGULAIR) 10 MG tablet Take 1 tablet (10 mg total) by mouth at bedtime.   NON FORMULARY Diet:  Consistent Carbohydrate,   pantoprazole (PROTONIX) 40 MG tablet Take 40 mg by mouth every morning.   polyethylene glycol (  MIRALAX / GLYCOLAX) packet Take 17 g by mouth daily.   senna-docusate (SENOKOT-S) 8.6-50 MG tablet Take 1 tablet by mouth at bedtime.    simvastatin (ZOCOR) 10 MG tablet Take 10 mg by mouth at bedtime.    Sodium Fluoride (PREVIDENT 5000 BOOSTER) 1.1 % PSTE Place 1 application onto teeth at bedtime.    traMADol (ULTRAM) 50 MG tablet Take 1 tablet (50 mg total) by mouth 2 (two) times daily.   triamcinolone (NASACORT ALLERGY 24HR) 55 MCG/ACT AERO nasal inhaler Place 1 spray into the nose daily.   valACYclovir (VALTREX) 500 MG tablet Take 500 mg by mouth daily.    [DISCONTINUED] gabapentin (NEURONTIN) 300 MG capsule Take 300 mg by mouth 2 (two) times daily. Reported on 02/03/2016     SIGNIFICANT DIAGNOSTIC EXAMS   PREVIOUS  12-18-19: chest x-ray: Mild right upper lobe and bibasilar airspace opacities which could reflect early pneumonia (including atypical/viral infection).  12-17-18; ct of abdomen and pelvis: Slight edema of the mucosa of the distal antrum of the stomach. This could represent gastritis. Otherwise, benign-appearing abdomen and pelvis.  07-16-20 DEXA: t score 0.825  NO NEW LABS.    LABS REVIEWED PREVIOUS;   12-16-19: urine culture: citrobacter youngae 12-18-19: wbc 16.6; hgb 14.7; hct 45.8; mcv 87.1 plt 330; glucose 168; bun 23; creat 0.85; k+ 3.7; na++ 136; ca 9.7; liver normal albumin 4.4 12-20-19: wbc 12.9; hgb 14.8; hct 48.2 ;mcv 90.4 plt 296 glucose 152; bun 33; creat 1.03 ;k+ 3.8; na++ 139; ca 9.7  01-02-20:  wbc 14.5; hgb 12.9; hct 40.8 mcv 89.9 plt 171; glucose 128; bun 10; creat 0.80 ;k+ 4.3; na++ 138; ca 9.3 urine culture: multiple species.  02-24-20: chol 147; ldl 71; trig 120 hdl 52 hgb a1c 6.2 04-02-20; urine for micro-albumin <3.0  05-14-20: wbc 8.8; hgb 12.4; hct 39.7; mcv 88.4 plt 199; hgb a1c 6.8 vit B 12: 154   NO NEW LABS.    Review of Systems  Constitutional: Negative for malaise/fatigue.  Respiratory: Negative for cough and shortness of breath.   Cardiovascular: Negative for chest pain, palpitations and leg swelling.  Gastrointestinal: Negative for abdominal pain, constipation and heartburn.  Musculoskeletal: Negative for back pain, joint pain and myalgias.  Skin: Negative.   Neurological: Negative for dizziness.  Psychiatric/Behavioral: The patient is not nervous/anxious.     Physical Exam Constitutional:      General: She is not in acute distress.    Appearance: She is well-developed. She is not diaphoretic.  Neck:     Thyroid: No thyromegaly.  Cardiovascular:     Rate and Rhythm: Normal rate and regular rhythm.     Pulses: Normal pulses.     Heart sounds: Normal heart sounds.  Pulmonary:     Effort: Pulmonary effort is normal. No respiratory distress.     Breath sounds: Normal breath sounds.  Abdominal:     General: Bowel sounds are normal. There is no distension.     Palpations: Abdomen is soft.     Tenderness: There is no abdominal tenderness.  Musculoskeletal:        General: Normal range of motion.     Cervical back: Neck supple.     Right lower leg: No edema.     Left lower leg: No edema.  Lymphadenopathy:     Cervical: No cervical adenopathy.  Skin:    General: Skin is warm and dry.  Neurological:     Mental Status: She is alert. Mental status is at baseline.  Psychiatric:        Mood and Affect: Mood normal.       ASSESSMENT/ PLAN:  TODAY  1. Major depression recurrent chronic 2. Hypertension associated with type 2 diabetes mellitus 3. Cord  compression  Will continue current medications  Will continue current plan of care Will continue to monitor her status.      MD is aware of resident's narcotic use and is in agreement with current plan of care. We will attempt to wean resident as appropriate.  Synthia Innocent NP [facility]  Contact [phone] Monday through Friday 8am- 5pm  After hours call [phone]

## 2020-08-31 ENCOUNTER — Encounter (HOSPITAL_COMMUNITY)
Admission: RE | Admit: 2020-08-31 | Discharge: 2020-08-31 | Disposition: A | Discharge status: Home / Self Care | Payer: Medicare Other | Source: Skilled Nursing Facility | Attending: Adult Health | Admitting: Adult Health

## 2020-08-31 DIAGNOSIS — B009 Herpesviral infection, unspecified: Secondary | ICD-10-CM | POA: Insufficient documentation

## 2020-09-01 LAB — URINE CULTURE

## 2020-09-08 ENCOUNTER — Other Ambulatory Visit: Payer: Self-pay | Admitting: Adult Health

## 2020-09-08 MED ORDER — TRAMADOL HCL 50 MG PO TABS: 50.0000 mg | ORAL_TABLET | Freq: Two times a day (BID) | ORAL | 0 refills | Status: DC

## 2020-09-21 ENCOUNTER — Encounter (HOSPITAL_COMMUNITY)
Admission: RE | Admit: 2020-09-21 | Discharge: 2020-09-21 | Disposition: A | Discharge status: Home / Self Care | Payer: Medicare Other | Source: Skilled Nursing Facility | Attending: Adult Health | Admitting: Adult Health

## 2020-09-21 DIAGNOSIS — A609 Anogenital herpesviral infection, unspecified: Secondary | ICD-10-CM | POA: Diagnosis not present

## 2020-09-21 DIAGNOSIS — R350 Frequency of micturition: Secondary | ICD-10-CM | POA: Diagnosis not present

## 2020-09-21 DIAGNOSIS — R3915 Urgency of urination: Secondary | ICD-10-CM | POA: Insufficient documentation

## 2020-09-23 ENCOUNTER — Non-Acute Institutional Stay (SKILLED_NURSING_FACILITY): Payer: Medicare Other | Admitting: Adult Health

## 2020-09-23 ENCOUNTER — Encounter: Payer: Self-pay | Admitting: Adult Health

## 2020-09-23 DIAGNOSIS — B9620 Unspecified Escherichia coli [E. coli] as the cause of diseases classified elsewhere: Secondary | ICD-10-CM

## 2020-09-23 DIAGNOSIS — N390 Urinary tract infection, site not specified: Secondary | ICD-10-CM

## 2020-09-23 LAB — URINE CULTURE: Culture: >=100000 — AB

## 2020-09-23 NOTE — Progress Notes (Signed)
Location:    [facility] Room Number: 115/W Place of Service:  SNF (31)   CODE STATUS: Full Code  Allergies  Allergen Reactions  . Lisinopril   . Penicillins Nausea Only    Has patient had a PCN reaction causing immediate rash, facial/tongue/throat swelling, SOB or lightheadedness with hypotension: Yes Has patient had a PCN reaction causing severe rash involving mucus membranes or skin necrosis: No Has patient had a PCN reaction that required hospitalization No Has patient had a PCN reaction occurring within the last 10 years: No If all of the above answers are "NO", then may proceed with Cephalosporin use.   . Sulfa Antibiotics Nausea And Vomiting    Chief Complaint  Patient presents with  . Acute Visit    UTI    HPI:  For the past several days she has had increased urgency; frequency and has had burning on urination. There are no reports of fevers. No changes in her appetite and fluid intake.   Past Medical History:  Diagnosis Date  . Anxiety   . Bowel obstruction (HCC)   . Burning with urination 01/04/2016  . Diabetes mellitus without complication (HCC)   . Genital herpes   . GERD (gastroesophageal reflux disease)   . Hematuria 01/04/2016  . Herpes 12/22/2015  . Hyperlipidemia   . Hypertension   . LLQ abdominal tenderness 01/04/2016  . Neuropathy   . Pelvic pressure in female 01/04/2016  . Recurrent UTI   . Sciatic nerve disease   . Small bowel obstruction (HCC) 04/27/2016  . Urinary frequency 01/04/2016  . Urticaria     Past Surgical History:  Procedure Laterality Date  . ABDOMINAL HYSTERECTOMY    . APPLICATION OF INTRAOPERATIVE CT SCAN N/A 12/19/2018   Procedure: APPLICATION OF INTRAOPERATIVE CT SCAN;  Surgeon: Maeola HarmanStern, Joseph, MD;  [facility];  Service: Neurosurgery;  Laterality: N/A;  . bowel obstruction    . COLON SURGERY     blocked colon  . POSTERIOR CERVICAL FUSION/FORAMINOTOMY N/A 12/19/2018   Procedure: Cervical Seven to  Thoracic One Posterior cervical fusion;  Surgeon: Maeola HarmanStern, Joseph, MD;  [facility];  Service: Neurosurgery;  Laterality: N/A;  Cervical Seven to Thoracic One Posterior cervical fusion    Social History   Socioeconomic History  . Marital status: Single    Spouse name: Not on file  . Number of children: Not on file  . Years of education: Not on file  . Highest education level: Not on file  Occupational History  . Not on file  Tobacco Use  . Smoking status: Never Smoker  . Smokeless tobacco: Current User    Types: Snuff  Vaping Use  . Vaping Use: Never used  Substance and Sexual Activity  . Alcohol use: No  . Drug use: No  . Sexual activity: Not Currently    Birth control/protection: Surgical    Comment: hyst  Other Topics Concern  . Not on file  Social History Narrative  . Not on file   Social Determinants of Health   Financial Resource Strain:   . Difficulty of Paying Living Expenses: Not on file  Food Insecurity:   . Worried About Programme researcher, broadcasting/film/videounning Out of Food in the Last Year: Not on file  . Ran Out of Food in the Last Year: Not on file  Transportation Needs:   . Lack of Transportation (Medical): Not on file  . Lack of Transportation (Non-Medical): Not on file  Physical Activity:   . Days of  Exercise per Week: Not on file  . Minutes of Exercise per Session: Not on file  Stress:   . Feeling of Stress : Not on file  Social Connections:   . Frequency of Communication with Friends and Family: Not on file  . Frequency of Social Gatherings with Friends and Family: Not on file  . Attends Religious Services: Not on file  . Active Member of Clubs or Organizations: Not on file  . Attends Banker Meetings: Not on file  . Marital Status: Not on file  Intimate Partner Violence:   . Fear of Current or Ex-Partner: Not on file  . Emotionally Abused: Not on file  . Physically Abused: Not on file  . Sexually Abused: Not on file   Family History  Problem Relation Age of  Onset  . Diabetes Mother   . Heart disease Brother   . Hypertension Brother   . Diabetes Brother   . Hypertension Daughter   . Diabetes Brother   . Diabetes Brother   . Alzheimer's disease Brother   . Anxiety disorder Daughter       VITAL SIGNS BP 122/66   Pulse 60   Temp 98.2 F (36.8 C)   Resp 18   Ht 5' (1.524 m)   Wt 177 lb 8 oz (80.5 kg)   BMI 34.67 kg/m   No facility-administered encounter medications on file as of 09/23/2020.   Outpatient Encounter Medications as of 09/23/2020  Medication Sig  . acetaminophen (TYLENOL) 325 MG tablet Take 650 mg by mouth every 8 (eight) hours as needed. As needed for pain or HA. Max 3gm acetaminophen/24 hrs from all sources.  Marland Kitchen acetaminophen (TYLENOL) 650 MG CR tablet Take 650 mg by mouth at bedtime.  Marland Kitchen amLODipine (NORVASC) 10 MG tablet Take 1 tablet (10 mg total) by mouth daily.  Marland Kitchen aspirin 81 MG chewable tablet Chew 81 mg by mouth daily.  Lucilla Lame [place]-Castor Oil (VENELEX) OINT Apply topically. apply to residents buttocks q shift PRN Every Shift - PRN  . ciprofloxacin (CIPRO) 500 MG tablet Take 500 mg by mouth 2 (two) times daily. For E-coli UTI  . cyanocobalamin 1000 MCG tablet Take 1,000 mcg by mouth daily. Special Instructions: for vit B 12 deficiency  . DULoxetine (CYMBALTA) 60 MG capsule Take 60 mg by mouth daily.   . Emollient (EUCERIN) lotion Apply topically 4 (four) times daily as needed for dry skin.  Marland Kitchen lamoTRIgine (LAMICTAL) 25 MG tablet Take 25 mg by mouth daily.   Marland Kitchen linagliptin (TRADJENTA) 5 MG TABS tablet Take 5 mg by mouth daily.  Marland Kitchen loratadine (CLARITIN) 10 MG tablet Take 10 mg by mouth daily.  . Melatonin 5 MG TABS Take 5 mg by mouth at bedtime.  . Menthol, Topical Analgesic, (BIOFREEZE ROLL-ON COLORLESS) 4 % GEL Apply 1 application topically daily as needed (FOR PAIN).   Marland Kitchen metFORMIN (GLUCOPHAGE) 1000 MG tablet Take 1,000 mg by mouth 2 (two) times daily with a meal.   . montelukast (SINGULAIR) 10 MG tablet Take 1  tablet (10 mg total) by mouth at bedtime.  . NON FORMULARY Diet:  Consistent Carbohydrate,  . pantoprazole (PROTONIX) 40 MG tablet Take 40 mg by mouth every morning.  . polyethylene glycol (MIRALAX / GLYCOLAX) packet Take 17 g by mouth daily.  Marland Kitchen senna-docusate (SENOKOT-S) 8.6-50 MG tablet Take 1 tablet by mouth at bedtime.   . simvastatin (ZOCOR) 10 MG tablet Take 10 mg by mouth at bedtime.   . Sodium Fluoride (  PREVIDENT 5000 BOOSTER) 1.1 % PSTE Place 1 application onto teeth at bedtime.   . traMADol (ULTRAM) 50 MG tablet Take 1 tablet (50 mg total) by mouth 2 (two) times daily.  Marland Kitchen triamcinolone (NASACORT ALLERGY 24HR) 55 MCG/ACT AERO nasal inhaler Place 1 spray into the nose daily.  . valACYclovir (VALTREX) 500 MG tablet Take 500 mg by mouth daily.   . [DISCONTINUED] gabapentin (NEURONTIN) 300 MG capsule Take 300 mg by mouth 2 (two) times daily. Reported on 02/03/2016     SIGNIFICANT DIAGNOSTIC EXAMS   PREVIOUS  12-18-19: chest x-ray: Mild right upper lobe and bibasilar airspace opacities which could reflect early pneumonia (including atypical/viral infection).  12-17-18; ct of abdomen and pelvis: Slight edema of the mucosa of the distal antrum of the stomach. This could represent gastritis. Otherwise, benign-appearing abdomen and pelvis.  07-16-20 DEXA: t score 0.825  NO NEW LABS.    LABS REVIEWED PREVIOUS;   12-16-19: urine culture: citrobacter youngae 12-18-19: wbc 16.6; hgb 14.7; hct 45.8; mcv 87.1 plt 330; glucose 168; bun 23; creat 0.85; k+ 3.7; na++ 136; ca 9.7; liver normal albumin 4.4 12-20-19: wbc 12.9; hgb 14.8; hct 48.2 ;mcv 90.4 plt 296 glucose 152; bun 33; creat 1.03 ;k+ 3.8; na++ 139; ca 9.7  01-02-20: wbc 14.5; hgb 12.9; hct 40.8 mcv 89.9 plt 171; glucose 128; bun 10; creat 0.80 ;k+ 4.3; na++ 138; ca 9.3 urine culture: multiple species.  02-24-20: chol 147; ldl 71; trig 120 hdl 52 hgb a1c 6.2 04-02-20; urine for micro-albumin <3.0  05-14-20: wbc 8.8; hgb 12.4; hct 39.7; mcv 88.4  plt 199; hgb a1c 6.8 vit B 12: 154   TODAY  08-27-20: hepatitis C: neg 08-31-20: urine culture: multiple species 09-21-20: urine culture: e-coli: cipro   Review of Systems  Constitutional: Negative for malaise/fatigue.  Respiratory: Negative for cough and shortness of breath.   Cardiovascular: Negative for chest pain, palpitations and leg swelling.  Gastrointestinal: Negative for abdominal pain, constipation and heartburn.  Genitourinary: Positive for frequency and urgency.       Dysuria   Musculoskeletal: Negative for back pain, joint pain and myalgias.  Skin: Negative.   Neurological: Negative for dizziness.  Psychiatric/Behavioral: The patient is not nervous/anxious.     Physical Exam Constitutional:      General: She is not in acute distress.    Appearance: She is well-developed. She is not diaphoretic.  Neck:     Thyroid: No thyromegaly.  Cardiovascular:     Rate and Rhythm: Normal rate and regular rhythm.     Pulses: Normal pulses.     Heart sounds: Normal heart sounds.  Pulmonary:     Effort: Pulmonary effort is normal. No respiratory distress.     Breath sounds: Normal breath sounds.  Abdominal:     General: Bowel sounds are normal. There is no distension.     Palpations: Abdomen is soft.     Tenderness: There is no abdominal tenderness.  Musculoskeletal:        General: Normal range of motion.     Cervical back: Neck supple.     Right lower leg: No edema.     Left lower leg: No edema.  Lymphadenopathy:     Cervical: No cervical adenopathy.  Skin:    General: Skin is warm and dry.  Neurological:     Mental Status: She is alert. Mental status is at baseline.  Psychiatric:        Mood and Affect: Mood normal.  ASSESSMENT/ PLAN:  TODAY  1. E-coli: UTI: is worse: will begin cipro 500 mg twice daily through 09-30-20. Will monitor her status.     MD is aware of resident's narcotic use and is in agreement with current plan of care. We will attempt  to wean resident as appropriate.  Synthia Innocent NP [facility]  Contact [phone] Monday through Friday 8am- 5pm  After hours call [phone]

## 2020-09-25 ENCOUNTER — Non-Acute Institutional Stay (SKILLED_NURSING_FACILITY): Payer: Medicare Other | Admitting: Adult Health

## 2020-09-25 ENCOUNTER — Encounter: Payer: Self-pay | Admitting: Adult Health

## 2020-09-25 DIAGNOSIS — B009 Herpesviral infection, unspecified: Secondary | ICD-10-CM | POA: Diagnosis not present

## 2020-09-25 DIAGNOSIS — K5909 Other constipation: Secondary | ICD-10-CM

## 2020-09-25 DIAGNOSIS — E538 Deficiency of other specified B group vitamins: Secondary | ICD-10-CM

## 2020-09-25 NOTE — Progress Notes (Signed)
Location:    [facility] Room Number: 115/W Place of Service:  SNF (31)   CODE STATUS: Full Code  Allergies  Allergen Reactions  . Lisinopril   . Penicillins Nausea Only    Has patient had a PCN reaction causing immediate rash, facial/tongue/throat swelling, SOB or lightheadedness with hypotension: Yes Has patient had a PCN reaction causing severe rash involving mucus membranes or skin necrosis: No Has patient had a PCN reaction that required hospitalization No Has patient had a PCN reaction occurring within the last 10 years: No If all of the above answers are "NO", then may proceed with Cephalosporin use.   . Sulfa Antibiotics Nausea And Vomiting    Chief Complaint  Patient presents with  . Medical Management of Chronic Issues            Herpes:    Vit B 12 deficiency: Chronic constipation    HPI:  She is a 79 year old long term resident of this facility being seen for the management of her chronic illnesses: Herpes:    Vit B 12 deficiency: Chronic constipation. There are no reports of uncontrolled pain; no reports of agitation or anxiety. No reports of insomnia.   Past Medical History:  Diagnosis Date  . Anxiety   . Bowel obstruction (HCC)   . Burning with urination 01/04/2016  . Diabetes mellitus without complication (HCC)   . Genital herpes   . GERD (gastroesophageal reflux disease)   . Hematuria 01/04/2016  . Herpes 12/22/2015  . Hyperlipidemia   . Hypertension   . LLQ abdominal tenderness 01/04/2016  . Neuropathy   . Pelvic pressure in female 01/04/2016  . Recurrent UTI   . Sciatic nerve disease   . Small bowel obstruction (HCC) 04/27/2016  . Urinary frequency 01/04/2016  . Urticaria     Past Surgical History:  Procedure Laterality Date  . ABDOMINAL HYSTERECTOMY    . APPLICATION OF INTRAOPERATIVE CT SCAN N/A 12/19/2018   Procedure: APPLICATION OF INTRAOPERATIVE CT SCAN;  Surgeon: Maeola HarmanStern, Joseph, MD;  [facility];  Service: Neurosurgery;   Laterality: N/A;  . bowel obstruction    . COLON SURGERY     blocked colon  . POSTERIOR CERVICAL FUSION/FORAMINOTOMY N/A 12/19/2018   Procedure: Cervical Seven to Thoracic One Posterior cervical fusion;  Surgeon: Maeola HarmanStern, Joseph, MD;  [facility];  Service: Neurosurgery;  Laterality: N/A;  Cervical Seven to Thoracic One Posterior cervical fusion    Social History   Socioeconomic History  . Marital status: Single    Spouse name: Not on file  . Number of children: Not on file  . Years of education: Not on file  . Highest education level: Not on file  Occupational History  . Not on file  Tobacco Use  . Smoking status: Never Smoker  . Smokeless tobacco: Current User    Types: Snuff  Vaping Use  . Vaping Use: Never used  Substance and Sexual Activity  . Alcohol use: No  . Drug use: No  . Sexual activity: Not Currently    Birth control/protection: Surgical    Comment: hyst  Other Topics Concern  . Not on file  Social History Narrative  . Not on file   Social Determinants of Health   Financial Resource Strain:   . Difficulty of Paying Living Expenses: Not on file  Food Insecurity:   . Worried About Programme researcher, broadcasting/film/videounning Out of Food in the Last Year: Not on file  . Ran Out of Food  in the Last Year: Not on file  Transportation Needs:   . Lack of Transportation (Medical): Not on file  . Lack of Transportation (Non-Medical): Not on file  Physical Activity:   . Days of Exercise per Week: Not on file  . Minutes of Exercise per Session: Not on file  Stress:   . Feeling of Stress : Not on file  Social Connections:   . Frequency of Communication with Friends and Family: Not on file  . Frequency of Social Gatherings with Friends and Family: Not on file  . Attends Religious Services: Not on file  . Active Member of Clubs or Organizations: Not on file  . Attends Banker Meetings: Not on file  . Marital Status: Not on file  Intimate Partner Violence:   . Fear of Current or  Ex-Partner: Not on file  . Emotionally Abused: Not on file  . Physically Abused: Not on file  . Sexually Abused: Not on file   Family History  Problem Relation Age of Onset  . Diabetes Mother   . Heart disease Brother   . Hypertension Brother   . Diabetes Brother   . Hypertension Daughter   . Diabetes Brother   . Diabetes Brother   . Alzheimer's disease Brother   . Anxiety disorder Daughter       VITAL SIGNS BP 122/66   Pulse 60   Temp 98 F (36.7 C)   Resp 18   Ht 5' (1.524 m)   Wt 177 lb 8 oz (80.5 kg)   BMI 34.67 kg/m   Outpatient Encounter Medications as of 09/25/2020  Medication Sig  . acetaminophen (TYLENOL) 325 MG tablet Take 650 mg by mouth every 8 (eight) hours as needed. As needed for pain or HA. Max 3gm acetaminophen/24 hrs from all sources.  Marland Kitchen acetaminophen (TYLENOL) 650 MG CR tablet Take 650 mg by mouth at bedtime.  Marland Kitchen amLODipine (NORVASC) 10 MG tablet Take 1 tablet (10 mg total) by mouth daily.  Marland Kitchen aspirin 81 MG chewable tablet Chew 81 mg by mouth daily.  Lucilla Lame [place]-Castor Oil (VENELEX) OINT Apply topically. apply to residents buttocks q shift PRN Every Shift - PRN  . ciprofloxacin (CIPRO) 500 MG tablet Take 500 mg by mouth 2 (two) times daily. For E-coli UTI  . cyanocobalamin 1000 MCG tablet Take 1,000 mcg by mouth daily. Special Instructions: for vit B 12 deficiency  . DULoxetine (CYMBALTA) 60 MG capsule Take 60 mg by mouth daily.   . Emollient (EUCERIN) lotion Apply topically 4 (four) times daily as needed for dry skin.  Marland Kitchen lamoTRIgine (LAMICTAL) 25 MG tablet Take 25 mg by mouth daily.   Marland Kitchen linagliptin (TRADJENTA) 5 MG TABS tablet Take 5 mg by mouth daily.  Marland Kitchen loratadine (CLARITIN) 10 MG tablet Take 10 mg by mouth daily.  . Melatonin 5 MG TABS Take 5 mg by mouth at bedtime.  . Menthol, Topical Analgesic, (BIOFREEZE ROLL-ON COLORLESS) 4 % GEL Apply 1 application topically daily as needed (FOR PAIN).   Marland Kitchen metFORMIN (GLUCOPHAGE) 1000 MG tablet Take 1,000 mg  by mouth 2 (two) times daily with a meal.   . montelukast (SINGULAIR) 10 MG tablet Take 1 tablet (10 mg total) by mouth at bedtime.  . NON FORMULARY Diet:  Consistent Carbohydrate,  . pantoprazole (PROTONIX) 40 MG tablet Take 40 mg by mouth every morning.  . polyethylene glycol (MIRALAX / GLYCOLAX) packet Take 17 g by mouth daily.  Marland Kitchen senna-docusate (SENOKOT-S) 8.6-50 MG tablet Take  1 tablet by mouth at bedtime.   . simvastatin (ZOCOR) 10 MG tablet Take 10 mg by mouth at bedtime.   . Sodium Fluoride (PREVIDENT 5000 BOOSTER) 1.1 % PSTE Place 1 application onto teeth at bedtime.   . traMADol (ULTRAM) 50 MG tablet Take 1 tablet (50 mg total) by mouth 2 (two) times daily.  Marland Kitchen triamcinolone (NASACORT ALLERGY 24HR) 55 MCG/ACT AERO nasal inhaler Place 1 spray into the nose daily.  . valACYclovir (VALTREX) 500 MG tablet Take 500 mg by mouth daily.   . [DISCONTINUED] gabapentin (NEURONTIN) 300 MG capsule Take 300 mg by mouth 2 (two) times daily. Reported on 02/03/2016   No facility-administered encounter medications on file as of 09/25/2020.     SIGNIFICANT DIAGNOSTIC EXAMS  PREVIOUS  12-18-19: chest x-ray: Mild right upper lobe and bibasilar airspace opacities which could reflect early pneumonia (including atypical/viral infection).  12-17-18; ct of abdomen and pelvis: Slight edema of the mucosa of the distal antrum of the stomach. This could represent gastritis. Otherwise, benign-appearing abdomen and pelvis.  07-16-20 DEXA: t score 0.825   NO NEW EXAMS.   LABS REVIEWED PREVIOUS;   12-16-19: urine culture: citrobacter youngae 12-18-19: wbc 16.6; hgb 14.7; hct 45.8; mcv 87.1 plt 330; glucose 168; bun 23; creat 0.85; k+ 3.7; na++ 136; ca 9.7; liver normal albumin 4.4 12-20-19: wbc 12.9; hgb 14.8; hct 48.2 ;mcv 90.4 plt 296 glucose 152; bun 33; creat 1.03 ;k+ 3.8; na++ 139; ca 9.7  01-02-20: wbc 14.5; hgb 12.9; hct 40.8 mcv 89.9 plt 171; glucose 128; bun 10; creat 0.80 ;k+ 4.3; na++ 138; ca 9.3 urine  culture: multiple species.  02-24-20: chol 147; ldl 71; trig 120 hdl 52 hgb a1c 6.2 04-02-20; urine for micro-albumin <3.0  05-14-20: wbc 8.8; hgb 12.4; hct 39.7; mcv 88.4 plt 199; hgb a1c 6.8 vit B 12: 154  08-27-20: hepatitis C: neg 08-31-20: urine culture: multiple species 09-21-20: urine culture: e-coli: cipro   NO NEW LABS   Review of Systems  Constitutional: Negative for malaise/fatigue.  Respiratory: Negative for cough and shortness of breath.   Cardiovascular: Negative for chest pain, palpitations and leg swelling.  Gastrointestinal: Negative for abdominal pain, constipation and heartburn.  Musculoskeletal: Negative for back pain, joint pain and myalgias.  Skin: Negative.   Neurological: Negative for dizziness.  Psychiatric/Behavioral: The patient is not nervous/anxious.     Physical Exam Constitutional:      General: She is not in acute distress.    Appearance: She is well-developed. She is not diaphoretic.  Neck:     Thyroid: No thyromegaly.  Cardiovascular:     Rate and Rhythm: Normal rate and regular rhythm.     Pulses: Normal pulses.     Heart sounds: Normal heart sounds.  Pulmonary:     Effort: Pulmonary effort is normal. No respiratory distress.     Breath sounds: Normal breath sounds.  Abdominal:     General: Bowel sounds are normal. There is no distension.     Palpations: Abdomen is soft.     Tenderness: There is no abdominal tenderness.  Musculoskeletal:        General: Normal range of motion.     Cervical back: Neck supple.     Right lower leg: No edema.     Left lower leg: No edema.  Lymphadenopathy:     Cervical: No cervical adenopathy.  Skin:    General: Skin is warm and dry.  Neurological:     Mental Status: She is alert.  Mental status is at baseline.  Psychiatric:        Mood and Affect: Mood normal.         ASSESSMENT/ PLAN:  TODAY  1. Herpes: is stable no recent outbreaks; will continue valtrex 500 mg daily   2. Vit B 12 deficiency: is  stable level is 154 will continue 1000 mcg daily   3. Chronic constipation: is stable will continue miralax daily and senna s daily   PREVIOUS   4. Major depression recurrent chronic: is stable will continue cymbalta 60 mg daily lamictal 25 mg   daily for mood and melatonin 5 mg nightly   5. Hypertension associated with type 2 diabetes mellitus: is stable b/p 136/85 will continue norvasc 10 mg daily asa 81 mg daily   6. Dyslipidemia associated with type 2 diabetes mellitus: is stable LDL 71  will continue zocor 10 mg daily   7. Non-insulin dependent type 2 diabetes mellitus: is stable hgb a1c 6.8 will continue metformin 1 gm twice daily and tradjenta 5 mg daily is on statin and asa  8. Gastroesophageal reflux disease without esophagitis: is stable will continue protonix 40 mg daily   9. Chronic non-seasonal allergic rhinitis: is stable will continue claritin 10 mg daily and singulair 10 mg daily          MD is aware of resident's narcotic use and is in agreement with current plan of care. We will attempt to wean resident as appropriate.  Synthia Innocent NP [facility]  Contact [phone] Monday through Friday 8am- 5pm  After hours call [phone]

## 2020-09-30 ENCOUNTER — Other Ambulatory Visit: Payer: Self-pay | Admitting: Adult Health

## 2020-09-30 MED ORDER — TRAMADOL HCL 50 MG PO TABS: 50.0000 mg | ORAL_TABLET | Freq: Two times a day (BID) | ORAL | 0 refills | Status: DC

## 2020-10-01 ENCOUNTER — Encounter: Payer: Self-pay | Admitting: Adult Health

## 2020-10-01 ENCOUNTER — Other Ambulatory Visit (HOSPITAL_COMMUNITY)
Admission: RE | Admit: 2020-10-01 | Discharge: 2020-10-01 | Disposition: A | Discharge status: Home / Self Care | Payer: Medicare Other | Source: Skilled Nursing Facility | Attending: Adult Health | Admitting: Adult Health

## 2020-10-01 ENCOUNTER — Non-Acute Institutional Stay (SKILLED_NURSING_FACILITY): Payer: Medicare Other | Admitting: Adult Health

## 2020-10-01 DIAGNOSIS — G2579 Other drug induced movement disorders: Secondary | ICD-10-CM | POA: Diagnosis not present

## 2020-10-01 DIAGNOSIS — I10 Essential (primary) hypertension: Secondary | ICD-10-CM | POA: Insufficient documentation

## 2020-10-01 DIAGNOSIS — N390 Urinary tract infection, site not specified: Secondary | ICD-10-CM

## 2020-10-01 DIAGNOSIS — E119 Type 2 diabetes mellitus without complications: Secondary | ICD-10-CM | POA: Diagnosis present

## 2020-10-01 DIAGNOSIS — B9620 Unspecified Escherichia coli [E. coli] as the cause of diseases classified elsewhere: Secondary | ICD-10-CM

## 2020-10-01 DIAGNOSIS — G9081 Serotonin syndrome: Secondary | ICD-10-CM

## 2020-10-01 LAB — CBC
HCT: 43.3 % (ref 36.0–46.0)
Hemoglobin: 13.5 g/dL (ref 12.0–15.0)
MCH: 27.3 pg (ref 26.0–34.0)
MCHC: 31.2 g/dL (ref 30.0–36.0)
MCV: 87.7 fL (ref 80.0–100.0)
Platelets: 240 10*3/uL (ref 150–400)
RBC: 4.94 MIL/uL (ref 3.87–5.11)
RDW: 14.8 % (ref 11.5–15.5)
WBC: 14.8 10*3/uL — ABNORMAL HIGH (ref 4.0–10.5)
nRBC: 0 % (ref 0.0–0.2)

## 2020-10-01 LAB — BASIC METABOLIC PANEL
Anion gap: 13 (ref 5–15)
BUN: 19 mg/dL (ref 8–23)
CO2: 26 mmol/L (ref 22–32)
Calcium: 9.7 mg/dL (ref 8.9–10.3)
Chloride: 97 mmol/L — ABNORMAL LOW (ref 98–111)
Creatinine, Ser: 1.05 mg/dL — ABNORMAL HIGH (ref 0.44–1.00)
GFR, Estimated: 54 mL/min — ABNORMAL LOW (ref >60)
Glucose, Bld: 146 mg/dL — ABNORMAL HIGH (ref 70–99)
Potassium: 4.4 mmol/L (ref 3.5–5.1)
Sodium: 136 mmol/L (ref 135–145)

## 2020-10-01 LAB — FOLATE: Folate: 13.9 ng/mL (ref >5.9)

## 2020-10-01 LAB — TSH: TSH: 4.543 u[IU]/mL — ABNORMAL HIGH (ref 0.350–4.500)

## 2020-10-01 LAB — VITAMIN B12: Vitamin B-12: 877 pg/mL (ref 180–914)
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
## 2020-10-05 DIAGNOSIS — G2579 Other drug induced movement disorders: Secondary | ICD-10-CM | POA: Insufficient documentation

## 2020-10-05 DIAGNOSIS — T50991A Poisoning by other drugs, medicaments and biological substances, accidental (unintentional), initial encounter: Secondary | ICD-10-CM | POA: Insufficient documentation

## 2020-10-23 ENCOUNTER — Encounter: Payer: Self-pay | Admitting: Adult Health

## 2020-10-23 ENCOUNTER — Non-Acute Institutional Stay (SKILLED_NURSING_FACILITY): Payer: Medicare Other | Admitting: Adult Health

## 2020-10-23 DIAGNOSIS — E1159 Type 2 diabetes mellitus with other circulatory complications: Secondary | ICD-10-CM | POA: Diagnosis not present

## 2020-10-23 DIAGNOSIS — I152 Hypertension secondary to endocrine disorders: Secondary | ICD-10-CM

## 2020-10-23 DIAGNOSIS — F339 Major depressive disorder, recurrent, unspecified: Secondary | ICD-10-CM | POA: Diagnosis not present

## 2020-10-23 DIAGNOSIS — E785 Hyperlipidemia, unspecified: Secondary | ICD-10-CM

## 2020-10-23 DIAGNOSIS — E1169 Type 2 diabetes mellitus with other specified complication: Secondary | ICD-10-CM

## 2020-10-23 NOTE — Progress Notes (Signed)
Location:     [facility] Room Number: 115-W Place of Service:  SNF (31)   CODE STATUS: Full Code  Allergies  Allergen Reactions  . Lisinopril   . Penicillins Nausea Only    Has patient had a PCN reaction causing immediate rash, facial/tongue/throat swelling, SOB or lightheadedness with hypotension: Yes Has patient had a PCN reaction causing severe rash involving mucus membranes or skin necrosis: No Has patient had a PCN reaction that required hospitalization No Has patient had a PCN reaction occurring within the last 10 years: No If all of the above answers are "NO", then may proceed with Cephalosporin use.   . Sulfa Antibiotics Nausea And Vomiting    Chief Complaint  Patient presents with  . Medical Management of Chronic Issues         Major depression recurrent chronic:   Hypertension associated with type 2 diabetes mellitus:    Dyslipidemia associated with type 2 diabetes mellitus    HPI:  She is a 79 year old long term resident of this facility being seen for the management of her chronic illnesses: Major depression recurrent chronic:   Hypertension associated with type 2 diabetes mellitus:    Dyslipidemia associated with type 2 diabetes mellitus.  There are no reports of uncontrolled pain; no reports of further anxiety and depressive thoughts since her medications were adjusted. There are no reports of constipation.   Past Medical History:  Diagnosis Date  . Anxiety   . Bowel obstruction (HCC)   . Burning with urination 01/04/2016  . Diabetes mellitus without complication (HCC)   . Genital herpes   . GERD (gastroesophageal reflux disease)   . Hematuria 01/04/2016  . Herpes 12/22/2015  . Hyperlipidemia   . Hypertension   . LLQ abdominal tenderness 01/04/2016  . Neuropathy   . Pelvic pressure in female 01/04/2016  . Recurrent UTI   . Sciatic nerve disease   . Small bowel obstruction (HCC) 04/27/2016  . Urinary frequency 01/04/2016  . Urticaria      Past Surgical History:  Procedure Laterality Date  . ABDOMINAL HYSTERECTOMY    . APPLICATION OF INTRAOPERATIVE CT SCAN N/A 12/19/2018   Procedure: APPLICATION OF INTRAOPERATIVE CT SCAN;  Surgeon: Maeola HarmanStern, Joseph, MD;  Location: [facility] OR;  Service: Neurosurgery;  Laterality: N/A;  . bowel obstruction    . COLON SURGERY     blocked colon  . POSTERIOR CERVICAL FUSION/FORAMINOTOMY N/A 12/19/2018   Procedure: Cervical Seven to Thoracic One Posterior cervical fusion;  Surgeon: Maeola HarmanStern, Joseph, MD;  Location: [facility] OR;  Service: Neurosurgery;  Laterality: N/A;  Cervical Seven to Thoracic One Posterior cervical fusion    Social History   Socioeconomic History  . Marital status: Single    Spouse name: Not on file  . Number of children: Not on file  . Years of education: Not on file  . Highest education level: Not on file  Occupational History  . Not on file  Tobacco Use  . Smoking status: Never Smoker  . Smokeless tobacco: Current User    Types: Snuff  Vaping Use  . Vaping Use: Never used  Substance and Sexual Activity  . Alcohol use: No  . Drug use: No  . Sexual activity: Not Currently    Birth control/protection: Surgical    Comment: hyst  Other Topics Concern  . Not on file  Social History Narrative  . Not on file   Social Determinants of Health   Financial Resource Strain:   .  Difficulty of Paying Living Expenses: Not on file  Food Insecurity:   . Worried About Programme researcher, broadcasting/film/video in the Last Year: Not on file  . Ran Out of Food in the Last Year: Not on file  Transportation Needs:   . Lack of Transportation (Medical): Not on file  . Lack of Transportation (Non-Medical): Not on file  Physical Activity:   . Days of Exercise per Week: Not on file  . Minutes of Exercise per Session: Not on file  Stress:   . Feeling of Stress : Not on file  Social Connections:   . Frequency of Communication with Friends and Family: Not on file  . Frequency of Social Gatherings with Friends and  Family: Not on file  . Attends Religious Services: Not on file  . Active Member of Clubs or Organizations: Not on file  . Attends Banker Meetings: Not on file  . Marital Status: Not on file  Intimate Partner Violence:   . Fear of Current or Ex-Partner: Not on file  . Emotionally Abused: Not on file  . Physically Abused: Not on file  . Sexually Abused: Not on file   Family History  Problem Relation Age of Onset  . Diabetes Mother   . Heart disease Brother   . Hypertension Brother   . Diabetes Brother   . Hypertension Daughter   . Diabetes Brother   . Diabetes Brother   . Alzheimer's disease Brother   . Anxiety disorder Daughter       VITAL SIGNS BP (!) 150/80   Pulse 78   Temp 97.9 F (36.6 C)   Ht 5' (1.524 m)   Wt 178 lb 9.6 oz (81 kg)   SpO2 98%   BMI 34.88 kg/m   Outpatient Encounter Medications as of 10/23/2020  Medication Sig  . acetaminophen (TYLENOL) 325 MG tablet Take 650 mg by mouth every 8 (eight) hours as needed. As needed for pain or HA. Max 3gm acetaminophen/24 hrs from all sources.  Marland Kitchen acetaminophen (TYLENOL) 650 MG CR tablet Take 650 mg by mouth at bedtime.  Marland Kitchen amLODipine (NORVASC) 10 MG tablet Take 1 tablet (10 mg total) by mouth daily.  Marland Kitchen aspirin 81 MG chewable tablet Chew 81 mg by mouth daily.  Lucilla Lame [place]-Castor Oil (VENELEX) OINT Apply topically. apply to residents buttocks q shift PRN Every Shift - PRN  . cyanocobalamin 1000 MCG tablet Take 1,000 mcg by mouth daily. Special Instructions: for vit B 12 deficiency  . DULoxetine (CYMBALTA) 60 MG capsule Take 60 mg by mouth daily.   . Emollient (EUCERIN) lotion Apply topically 4 (four) times daily as needed for dry skin.  Marland Kitchen lamoTRIgine (LAMICTAL) 25 MG tablet Take 25 mg by mouth 2 (two) times daily.   Marland Kitchen linagliptin (TRADJENTA) 5 MG TABS tablet Take 5 mg by mouth daily.  Marland Kitchen loratadine (CLARITIN) 10 MG tablet Take 10 mg by mouth daily.  . Melatonin 5 MG TABS Take 5 mg by mouth at bedtime.   . Menthol, Topical Analgesic, (BIOFREEZE ROLL-ON COLORLESS) 4 % GEL Apply 1 application topically daily as needed (FOR PAIN).   Marland Kitchen metFORMIN (GLUCOPHAGE) 1000 MG tablet Take 1,000 mg by mouth 2 (two) times daily with a meal.   . montelukast (SINGULAIR) 10 MG tablet Take 1 tablet (10 mg total) by mouth at bedtime.  . NON FORMULARY Diet:  Consistent Carbohydrate,  . pantoprazole (PROTONIX) 40 MG tablet Take 40 mg by mouth every morning.  . polyethylene glycol (  MIRALAX / GLYCOLAX) packet Take 17 g by mouth daily.  Marland Kitchen senna-docusate (SENOKOT-S) 8.6-50 MG tablet Take 1 tablet by mouth at bedtime.   . simvastatin (ZOCOR) 10 MG tablet Take 10 mg by mouth at bedtime.   . triamcinolone (NASACORT ALLERGY 24HR) 55 MCG/ACT AERO nasal inhaler Place 1 spray into the nose daily.  . valACYclovir (VALTREX) 500 MG tablet Take 500 mg by mouth daily.   . [DISCONTINUED] gabapentin (NEURONTIN) 300 MG capsule Take 300 mg by mouth 2 (two) times daily. Reported on 02/03/2016   No facility-administered encounter medications on file as of 10/23/2020.     SIGNIFICANT DIAGNOSTIC EXAMS   PREVIOUS  12-18-19: chest x-ray: Mild right upper lobe and bibasilar airspace opacities which could reflect early pneumonia (including atypical/viral infection).  12-17-18; ct of abdomen and pelvis: Slight edema of the mucosa of the distal antrum of the stomach. This could represent gastritis. Otherwise, benign-appearing abdomen and pelvis.  07-16-20 DEXA: t score 0.825   NO NEW EXAMS.   LABS REVIEWED PREVIOUS;   12-16-19: urine culture: citrobacter youngae 12-18-19: wbc 16.6; hgb 14.7; hct 45.8; mcv 87.1 plt 330; glucose 168; bun 23; creat 0.85; k+ 3.7; na++ 136; ca 9.7; liver normal albumin 4.4 12-20-19: wbc 12.9; hgb 14.8; hct 48.2 ;mcv 90.4 plt 296 glucose 152; bun 33; creat 1.03 ;k+ 3.8; na++ 139; ca 9.7  01-02-20: wbc 14.5; hgb 12.9; hct 40.8 mcv 89.9 plt 171; glucose 128; bun 10; creat 0.80 ;k+ 4.3; na++ 138; ca 9.3 urine culture:  multiple species.  02-24-20: chol 147; ldl 71; trig 120 hdl 52 hgb a1c 6.2 04-02-20; urine for micro-albumin <3.0  05-14-20: wbc 8.8; hgb 12.4; hct 39.7; mcv 88.4 plt 199; hgb a1c 6.8 vit B 12: 154  08-27-20: hepatitis C: neg 08-31-20: urine culture: multiple species 09-21-20: urine culture: e-coli: cipro  10-01-20: wbc 14.8; hgb 13.5; hct 43.3; mcv 87.7 plt 240; glucose 146; bun 19; creat 1.05; k+ 4.4; na=+ 136; ca 9.7 vitamin B 12: 877; folate 13.9; tsh 4.543  NO NEW LABS.    Review of Systems  Constitutional: Negative for malaise/fatigue.  Respiratory: Negative for cough and shortness of breath.   Cardiovascular: Negative for chest pain, palpitations and leg swelling.  Gastrointestinal: Negative for abdominal pain, constipation and heartburn.  Musculoskeletal: Negative for back pain, joint pain and myalgias.  Skin: Negative.   Neurological: Negative for dizziness.  Psychiatric/Behavioral: The patient is not nervous/anxious.     Physical Exam Constitutional:      General: She is not in acute distress.    Appearance: She is well-developed. She is not diaphoretic.  Neck:     Thyroid: No thyromegaly.  Cardiovascular:     Rate and Rhythm: Normal rate and regular rhythm.     Pulses: Normal pulses.     Heart sounds: Normal heart sounds.  Pulmonary:     Effort: Pulmonary effort is normal. No respiratory distress.     Breath sounds: Normal breath sounds.  Abdominal:     General: Bowel sounds are normal. There is no distension.     Palpations: Abdomen is soft.     Tenderness: There is no abdominal tenderness.  Musculoskeletal:        General: Normal range of motion.     Cervical back: Neck supple.     Right lower leg: No edema.     Left lower leg: No edema.  Lymphadenopathy:     Cervical: No cervical adenopathy.  Skin:    General: Skin is  warm and dry.  Neurological:     Mental Status: She is alert. Mental status is at baseline.  Psychiatric:        Mood and Affect: Mood  normal.          ASSESSMENT/ PLAN:  TODAY  1. Major depression recurrent chronic: is stable will continue cymbalta 60 mg daily lamictal 25 mg twice daily for mood and melatonin 5 mg nightly   2. Hypertension associated with type 2 diabetes mellitus: is stable b/p 150/80 will continue norvasc 10 mg daily asa 81 mg daily   3. Dyslipidemia associated with type 2 diabetes mellitus: is stable LDL 71; will continue zocor 10 mg daily    PREVIOUS   4. Non-insulin dependent type 2 diabetes mellitus: is stable hgb a1c 6.8 will continue metformin 1 gm twice daily and tradjenta 5 mg daily is on statin and asa  5. Gastroesophageal reflux disease without esophagitis: is stable will continue protonix 40 mg daily   6. Chronic non-seasonal allergic rhinitis: is stable will continue claritin 10 mg daily and singulair 10 mg daily   7. Herpes: is stable no recent outbreaks; will continue valtrex 500 mg daily   8. Vit B 12 deficiency: is stable level is 154 will continue 1000 mcg daily   9. Chronic constipation: is stable will continue miralax daily and senna s daily       MD is aware of resident's narcotic use and is in agreement with current plan of care. We will attempt to wean resident as appropriate.  Synthia Innocent NP [facility]  Contact [phone] Monday through Friday 8am- 5pm  After hours call [phone]

## 2020-10-27 ENCOUNTER — Other Ambulatory Visit: Payer: Self-pay | Admitting: Adult Health

## 2020-11-12 ENCOUNTER — Other Ambulatory Visit (HOSPITAL_COMMUNITY)
Admission: RE | Admit: 2020-11-12 | Discharge: 2020-11-12 | Disposition: A | Discharge status: Home / Self Care | Payer: Medicare Other | Source: Skilled Nursing Facility | Attending: Adult Health | Admitting: Adult Health

## 2020-11-12 DIAGNOSIS — R509 Fever, unspecified: Secondary | ICD-10-CM | POA: Diagnosis present

## 2020-11-12 DIAGNOSIS — Z20822 Contact with and (suspected) exposure to covid-19: Secondary | ICD-10-CM | POA: Diagnosis not present

## 2020-11-12 DIAGNOSIS — R059 Cough, unspecified: Secondary | ICD-10-CM | POA: Insufficient documentation

## 2020-11-12 LAB — RESP PANEL BY RT-PCR (RSV, FLU A&B, COVID)  RVPGX2
Influenza A by PCR: NEGATIVE
Influenza B by PCR: NEGATIVE
Resp Syncytial Virus by PCR: NEGATIVE
SARS Coronavirus 2 by RT PCR: NEGATIVE

## 2020-11-16 ENCOUNTER — Encounter: Payer: Self-pay | Admitting: Adult Health

## 2020-11-16 ENCOUNTER — Other Ambulatory Visit (HOSPITAL_COMMUNITY)
Admission: RE | Admit: 2020-11-16 | Discharge: 2020-11-16 | Disposition: A | Discharge status: Home / Self Care | Payer: Medicare Other | Source: Skilled Nursing Facility | Attending: Adult Health | Admitting: Adult Health

## 2020-11-16 ENCOUNTER — Non-Acute Institutional Stay (SKILLED_NURSING_FACILITY): Payer: Medicare Other | Admitting: Adult Health

## 2020-11-16 DIAGNOSIS — R509 Fever, unspecified: Secondary | ICD-10-CM | POA: Diagnosis present

## 2020-11-16 DIAGNOSIS — R3915 Urgency of urination: Secondary | ICD-10-CM | POA: Diagnosis not present

## 2020-11-16 DIAGNOSIS — Z8616 Personal history of COVID-19: Secondary | ICD-10-CM | POA: Diagnosis not present

## 2020-11-16 DIAGNOSIS — N390 Urinary tract infection, site not specified: Secondary | ICD-10-CM

## 2020-11-16 DIAGNOSIS — R300 Dysuria: Secondary | ICD-10-CM | POA: Insufficient documentation

## 2020-11-16 DIAGNOSIS — F0390 Unspecified dementia without behavioral disturbance: Secondary | ICD-10-CM | POA: Diagnosis not present

## 2020-11-16 LAB — CBC WITH DIFFERENTIAL/PLATELET
Abs Immature Granulocytes: 0.05 10*3/uL (ref 0.00–0.07)
Basophils Absolute: 0.1 10*3/uL (ref 0.0–0.1)
Basophils Relative: 1 %
Eosinophils Absolute: 0.3 10*3/uL (ref 0.0–0.5)
Eosinophils Relative: 3 %
HCT: 40.9 % (ref 36.0–46.0)
Hemoglobin: 12.7 g/dL (ref 12.0–15.0)
Immature Granulocytes: 0 %
Lymphocytes Relative: 18 %
Lymphs Abs: 2.2 10*3/uL (ref 0.7–4.0)
MCH: 27.5 pg (ref 26.0–34.0)
MCHC: 31.1 g/dL (ref 30.0–36.0)
MCV: 88.7 fL (ref 80.0–100.0)
Monocytes Absolute: 1.1 10*3/uL — ABNORMAL HIGH (ref 0.1–1.0)
Monocytes Relative: 9 %
Neutro Abs: 8 10*3/uL — ABNORMAL HIGH (ref 1.7–7.7)
Neutrophils Relative %: 69 %
Platelets: 213 10*3/uL (ref 150–400)
RBC: 4.61 MIL/uL (ref 3.87–5.11)
RDW: 14.6 % (ref 11.5–15.5)
WBC: 11.7 10*3/uL — ABNORMAL HIGH (ref 4.0–10.5)
nRBC: 0 % (ref 0.0–0.2)

## 2020-11-16 LAB — URINALYSIS, ROUTINE W REFLEX MICROSCOPIC
Bilirubin Urine: NEGATIVE
Glucose, UA: 50 mg/dL — AB
Hgb urine dipstick: NEGATIVE
Ketones, ur: NEGATIVE mg/dL
Nitrite: NEGATIVE
Protein, ur: NEGATIVE mg/dL
Specific Gravity, Urine: 1.018 (ref 1.005–1.030)
WBC, UA: >50 WBC/hpf — ABNORMAL HIGH (ref 0–5)
pH: 5 (ref 5.0–8.0)

## 2020-11-16 NOTE — Progress Notes (Unsigned)
Location:    [facility] Room Number: 115/W Place of Service:  SNF (31)   CODE STATUS: Full Code  Allergies  Allergen Reactions  . Lisinopril   . Penicillins Nausea Only    Has patient had a PCN reaction causing immediate rash, facial/tongue/throat swelling, SOB or lightheadedness with hypotension: Yes Has patient had a PCN reaction causing severe rash involving mucus membranes or skin necrosis: No Has patient had a PCN reaction that required hospitalization No Has patient had a PCN reaction occurring within the last 10 years: No If all of the above answers are "NO", then may proceed with Cephalosporin use.   . Sulfa Antibiotics Nausea And Vomiting    Chief Complaint  Patient presents with  . Acute Visit    Fever    HPI:  She has a low grade temp of 99.1. she has been swabbed for flu and covid. She denies any cough; no headaches; no body aches; no sore throat. She does complain of urinary urgency; dysuria and bladder pain. There is no change in her appetite.   Past Medical History:  Diagnosis Date  . Anxiety   . Bowel obstruction (HCC)   . Burning with urination 01/04/2016  . Diabetes mellitus without complication (HCC)   . Genital herpes   . GERD (gastroesophageal reflux disease)   . Hematuria 01/04/2016  . Herpes 12/22/2015  . Hyperlipidemia   . Hypertension   . LLQ abdominal tenderness 01/04/2016  . Neuropathy   . Pelvic pressure in female 01/04/2016  . Recurrent UTI   . Sciatic nerve disease   . Small bowel obstruction (HCC) 04/27/2016  . Urinary frequency 01/04/2016  . Urticaria     Past Surgical History:  Procedure Laterality Date  . ABDOMINAL HYSTERECTOMY    . APPLICATION OF INTRAOPERATIVE CT SCAN N/A 12/19/2018   Procedure: APPLICATION OF INTRAOPERATIVE CT SCAN;  Surgeon: Maeola Harman, MD;  Location: [facility] OR;  Service: Neurosurgery;  Laterality: N/A;  . bowel obstruction    . COLON SURGERY     blocked colon  . POSTERIOR CERVICAL  FUSION/FORAMINOTOMY N/A 12/19/2018   Procedure: Cervical Seven to Thoracic One Posterior cervical fusion;  Surgeon: Maeola Harman, MD;  Location: [facility] OR;  Service: Neurosurgery;  Laterality: N/A;  Cervical Seven to Thoracic One Posterior cervical fusion    Social History   Socioeconomic History  . Marital status: Single    Spouse name: Not on file  . Number of children: Not on file  . Years of education: Not on file  . Highest education level: Not on file  Occupational History  . Not on file  Tobacco Use  . Smoking status: Never Smoker  . Smokeless tobacco: Current User    Types: Snuff  Vaping Use  . Vaping Use: Never used  Substance and Sexual Activity  . Alcohol use: No  . Drug use: No  . Sexual activity: Not Currently    Birth control/protection: Surgical    Comment: hyst  Other Topics Concern  . Not on file  Social History Narrative  . Not on file   Social Determinants of Health   Financial Resource Strain:   . Difficulty of Paying Living Expenses: Not on file  Food Insecurity:   . Worried About Programme researcher, broadcasting/film/video in the Last Year: Not on file  . Ran Out of Food in the Last Year: Not on file  Transportation Needs:   . Lack of Transportation (Medical): Not on file  . Lack  of Transportation (Non-Medical): Not on file  Physical Activity:   . Days of Exercise per Week: Not on file  . Minutes of Exercise per Session: Not on file  Stress:   . Feeling of Stress : Not on file  Social Connections:   . Frequency of Communication with Friends and Family: Not on file  . Frequency of Social Gatherings with Friends and Family: Not on file  . Attends Religious Services: Not on file  . Active Member of Clubs or Organizations: Not on file  . Attends Banker Meetings: Not on file  . Marital Status: Not on file  Intimate Partner Violence:   . Fear of Current or Ex-Partner: Not on file  . Emotionally Abused: Not on file  . Physically Abused: Not on file  . Sexually  Abused: Not on file   Family History  Problem Relation Age of Onset  . Diabetes Mother   . Heart disease Brother   . Hypertension Brother   . Diabetes Brother   . Hypertension Daughter   . Diabetes Brother   . Diabetes Brother   . Alzheimer's disease Brother   . Anxiety disorder Daughter       VITAL SIGNS BP 139/81   Pulse 78   Temp 99.1 F (37.3 C)   Ht 5' (1.524 m)   Wt 178 lb 9.6 oz (81 kg)   BMI 34.88 kg/m   Outpatient Encounter Medications as of 11/16/2020  Medication Sig  . acetaminophen (TYLENOL) 325 MG tablet Take 650 mg by mouth every 8 (eight) hours as needed. As needed for pain or HA. Max 3gm acetaminophen/24 hrs from all sources.  Marland Kitchen acetaminophen (TYLENOL) 650 MG CR tablet Take 650 mg by mouth at bedtime.  Marland Kitchen amLODipine (NORVASC) 10 MG tablet Take 1 tablet (10 mg total) by mouth daily.  Marland Kitchen aspirin 81 MG chewable tablet Chew 81 mg by mouth daily.  Lucilla Lame [place]-Castor Oil (VENELEX) OINT Apply topically. apply to residents buttocks q shift PRN Every Shift - PRN  . cyanocobalamin 1000 MCG tablet Take 1,000 mcg by mouth daily. Special Instructions: for vit B 12 deficiency  . DULoxetine (CYMBALTA) 60 MG capsule Take 60 mg by mouth daily.   . Emollient (EUCERIN) lotion Apply topically 4 (four) times daily as needed for dry skin.  Marland Kitchen lamoTRIgine (LAMICTAL) 25 MG tablet Take 25 mg by mouth 2 (two) times daily.   Marland Kitchen linagliptin (TRADJENTA) 5 MG TABS tablet Take 5 mg by mouth daily.  Marland Kitchen loratadine (CLARITIN) 10 MG tablet Take 10 mg by mouth daily.  . Melatonin 5 MG TABS Take 5 mg by mouth at bedtime.  . Menthol, Topical Analgesic, (BIOFREEZE ROLL-ON COLORLESS) 4 % GEL Apply 1 application topically daily as needed (FOR PAIN).   Marland Kitchen metFORMIN (GLUCOPHAGE) 1000 MG tablet Take 1,000 mg by mouth 2 (two) times daily with a meal.   . montelukast (SINGULAIR) 10 MG tablet Take 1 tablet (10 mg total) by mouth at bedtime.  . NON FORMULARY Diet:  Consistent Carbohydrate,  . pantoprazole  (PROTONIX) 40 MG tablet Take 40 mg by mouth every morning.  . polyethylene glycol (MIRALAX / GLYCOLAX) packet Take 17 g by mouth daily.  Marland Kitchen senna-docusate (SENOKOT-S) 8.6-50 MG tablet Take 1 tablet by mouth at bedtime.   . simvastatin (ZOCOR) 10 MG tablet Take 10 mg by mouth at bedtime.   . triamcinolone (NASACORT ALLERGY 24HR) 55 MCG/ACT AERO nasal inhaler Place 1 spray into the nose daily.  . valACYclovir (  VALTREX) 500 MG tablet Take 500 mg by mouth daily.   . [DISCONTINUED] gabapentin (NEURONTIN) 300 MG capsule Take 300 mg by mouth 2 (two) times daily. Reported on 02/03/2016   No facility-administered encounter medications on file as of 11/16/2020.     SIGNIFICANT DIAGNOSTIC EXAMS   PREVIOUS  12-18-19: chest x-ray: Mild right upper lobe and bibasilar airspace opacities which could reflect early pneumonia (including atypical/viral infection).  12-17-18; ct of abdomen and pelvis: Slight edema of the mucosa of the distal antrum of the stomach. This could represent gastritis. Otherwise, benign-appearing abdomen and pelvis.  07-16-20 DEXA: t score 0.825   NO NEW EXAMS.   LABS REVIEWED PREVIOUS;   12-16-19: urine culture: citrobacter youngae 12-18-19: wbc 16.6; hgb 14.7; hct 45.8; mcv 87.1 plt 330; glucose 168; bun 23; creat 0.85; k+ 3.7; na++ 136; ca 9.7; liver normal albumin 4.4 12-20-19: wbc 12.9; hgb 14.8; hct 48.2 ;mcv 90.4 plt 296 glucose 152; bun 33; creat 1.03 ;k+ 3.8; na++ 139; ca 9.7  01-02-20: wbc 14.5; hgb 12.9; hct 40.8 mcv 89.9 plt 171; glucose 128; bun 10; creat 0.80 ;k+ 4.3; na++ 138; ca 9.3 urine culture: multiple species.  02-24-20: chol 147; ldl 71; trig 120 hdl 52 hgb a1c 6.2 04-02-20; urine for micro-albumin <3.0  05-14-20: wbc 8.8; hgb 12.4; hct 39.7; mcv 88.4 plt 199; hgb a1c 6.8 vit B 12: 154  08-27-20: hepatitis C: neg 08-31-20: urine culture: multiple species 09-21-20: urine culture: e-coli: cipro  10-01-20: wbc 14.8; hgb 13.5; hct 43.3; mcv 87.7 plt 240; glucose 146; bun 19;  creat 1.05; k+ 4.4; na=+ 136; ca 9.7 vitamin B 12: 877; folate 13.9; tsh 4.543  NO NEW LABS.   Review of Systems  Constitutional: Negative for malaise/fatigue.  Respiratory: Negative for cough and shortness of breath.   Cardiovascular: Negative for chest pain, palpitations and leg swelling.  Gastrointestinal: Negative for abdominal pain, constipation and heartburn.  Genitourinary: Positive for dysuria and urgency.       Bladder pain   Musculoskeletal: Negative for back pain, joint pain and myalgias.  Skin: Negative.   Neurological: Negative for dizziness.  Psychiatric/Behavioral: The patient is not nervous/anxious.     Physical Exam Constitutional:      General: She is not in acute distress.    Appearance: She is well-developed. She is not diaphoretic.  Neck:     Thyroid: No thyromegaly.  Cardiovascular:     Rate and Rhythm: Normal rate and regular rhythm.     Pulses: Normal pulses.     Heart sounds: Normal heart sounds.  Pulmonary:     Effort: Pulmonary effort is normal. No respiratory distress.     Breath sounds: Normal breath sounds.  Abdominal:     General: Bowel sounds are normal. There is no distension.     Palpations: Abdomen is soft.     Tenderness: There is abdominal tenderness.     Comments: Suprapubic   Musculoskeletal:        General: Normal range of motion.     Cervical back: Neck supple.     Right lower leg: No edema.     Left lower leg: No edema.  Lymphadenopathy:     Cervical: No cervical adenopathy.  Skin:    General: Skin is warm and dry.  Neurological:     Mental Status: She is alert. Mental status is at baseline.  Psychiatric:        Mood and Affect: Mood normal.       ASSESSMENT/  PLAN:  TODAY  1. Acute UTI: will get ua/c&s will treat as indicated.   MD is aware of resident's narcotic use and is in agreement with current plan of care. We will attempt to wean resident as appropriate.  Synthia Innocent NP [facility] Adult [facility]  Contact  [phone] Monday through Friday 8am- 5pm  After hours call [phone]

## 2020-11-17 DIAGNOSIS — B9689 Other specified bacterial agents as the cause of diseases classified elsewhere: Secondary | ICD-10-CM | POA: Insufficient documentation

## 2020-11-17 DIAGNOSIS — N390 Urinary tract infection, site not specified: Secondary | ICD-10-CM | POA: Insufficient documentation

## 2020-11-19 ENCOUNTER — Non-Acute Institutional Stay (SKILLED_NURSING_FACILITY): Payer: Medicare Other | Admitting: Adult Health

## 2020-11-19 ENCOUNTER — Encounter: Payer: Self-pay | Admitting: Adult Health

## 2020-11-19 DIAGNOSIS — E119 Type 2 diabetes mellitus without complications: Secondary | ICD-10-CM

## 2020-11-19 DIAGNOSIS — B9689 Other specified bacterial agents as the cause of diseases classified elsewhere: Secondary | ICD-10-CM

## 2020-11-19 DIAGNOSIS — K219 Gastro-esophageal reflux disease without esophagitis: Secondary | ICD-10-CM

## 2020-11-19 DIAGNOSIS — N390 Urinary tract infection, site not specified: Secondary | ICD-10-CM | POA: Diagnosis not present

## 2020-11-19 DIAGNOSIS — J3089 Other allergic rhinitis: Secondary | ICD-10-CM | POA: Diagnosis not present

## 2020-11-19 LAB — URINE CULTURE: Culture: >=100000 — AB

## 2020-11-19 NOTE — Progress Notes (Signed)
Location:    [facility] Room Number: 115/W Place of Service:  SNF (31)   CODE STATUS: Full Code  Allergies  Allergen Reactions  . Lisinopril   . Penicillins Nausea Only    Has patient had a PCN reaction causing immediate rash, facial/tongue/throat swelling, SOB or lightheadedness with hypotension: Yes Has patient had a PCN reaction causing severe rash involving mucus membranes or skin necrosis: No Has patient had a PCN reaction that required hospitalization No Has patient had a PCN reaction occurring within the last 10 years: No If all of the above answers are "NO", then may proceed with Cephalosporin use.   . Sulfa Antibiotics Nausea And Vomiting    Chief Complaint  Patient presents with  . Medical Management of Chronic Issues          Non-insulin dependent type 2 diabetes mellitus:   Gastroesophageal reflux disease without esophagitis:  Chronic non-seasonal allergic rhinitis    HPI:  She is a 79 year old long term resident of this facility being seen for the management of her chronic illnesses: Non-insulin dependent type 2 diabetes mellitus:   Gastroesophageal reflux disease without esophagitis:  Chronic non-seasonal allergic rhinitis. Her urine culture came back today demonstrating >100,000 colonies klebsiella pneumoniae; and will need treatment. There are no reports of hypoglycemic episodes. There are no reports of uncontrolled pain. No reports of heart burn.   Past Medical History:  Diagnosis Date  . Anxiety   . Bowel obstruction (HCC)   . Burning with urination 01/04/2016  . Diabetes mellitus without complication (HCC)   . Genital herpes   . GERD (gastroesophageal reflux disease)   . Hematuria 01/04/2016  . Herpes 12/22/2015  . Hyperlipidemia   . Hypertension   . LLQ abdominal tenderness 01/04/2016  . Neuropathy   . Pelvic pressure in female 01/04/2016  . Recurrent UTI   . Sciatic nerve disease   . Small bowel obstruction (HCC) 04/27/2016  .  Urinary frequency 01/04/2016  . Urticaria     Past Surgical History:  Procedure Laterality Date  . ABDOMINAL HYSTERECTOMY    . APPLICATION OF INTRAOPERATIVE CT SCAN N/A 12/19/2018   Procedure: APPLICATION OF INTRAOPERATIVE CT SCAN;  Surgeon: Maeola Harman, MD;  Location: [facility] OR;  Service: Neurosurgery;  Laterality: N/A;  . bowel obstruction    . COLON SURGERY     blocked colon  . POSTERIOR CERVICAL FUSION/FORAMINOTOMY N/A 12/19/2018   Procedure: Cervical Seven to Thoracic One Posterior cervical fusion;  Surgeon: Maeola Harman, MD;  Location: [facility] OR;  Service: Neurosurgery;  Laterality: N/A;  Cervical Seven to Thoracic One Posterior cervical fusion    Social History   Socioeconomic History  . Marital status: Single    Spouse name: Not on file  . Number of children: Not on file  . Years of education: Not on file  . Highest education level: Not on file  Occupational History  . Not on file  Tobacco Use  . Smoking status: Never Smoker  . Smokeless tobacco: Current User    Types: Snuff  Vaping Use  . Vaping Use: Never used  Substance and Sexual Activity  . Alcohol use: No  . Drug use: No  . Sexual activity: Not Currently    Birth control/protection: Surgical    Comment: hyst  Other Topics Concern  . Not on file  Social History Narrative  . Not on file   Social Determinants of Health   Financial Resource Strain: Not on file  Food  Insecurity: Not on file  Transportation Needs: Not on file  Physical Activity: Not on file  Stress: Not on file  Social Connections: Not on file  Intimate Partner Violence: Not on file   Family History  Problem Relation Age of Onset  . Diabetes Mother   . Heart disease Brother   . Hypertension Brother   . Diabetes Brother   . Hypertension Daughter   . Diabetes Brother   . Diabetes Brother   . Alzheimer's disease Brother   . Anxiety disorder Daughter       VITAL SIGNS BP 139/81   Pulse 78   Temp 98.7 F (37.1 C)   Ht 5' (1.524 m)    Wt 178 lb 9.6 oz (81 kg)   BMI 34.88 kg/m   Outpatient Encounter Medications as of 11/19/2020  Medication Sig  . acetaminophen (TYLENOL) 325 MG tablet Take 650 mg by mouth every 8 (eight) hours as needed. As needed for pain or HA. Max 3gm acetaminophen/24 hrs from all sources.  Marland Kitchen acetaminophen (TYLENOL) 650 MG CR tablet Take 650 mg by mouth at bedtime.  Marland Kitchen amLODipine (NORVASC) 10 MG tablet Take 1 tablet (10 mg total) by mouth daily.  Marland Kitchen aspirin 81 MG chewable tablet Chew 81 mg by mouth daily.  Lucilla Lame [place]-Castor Oil (VENELEX) OINT Apply topically. apply to residents buttocks q shift PRN Every Shift - PRN  . ciprofloxacin (CIPRO) 500 MG tablet Take 500 mg by mouth 2 (two) times daily.  . cyanocobalamin 1000 MCG tablet Take 1,000 mcg by mouth daily. Special Instructions: for vit B 12 deficiency  . DULoxetine (CYMBALTA) 60 MG capsule Take 60 mg by mouth daily.   . Emollient (EUCERIN) lotion Apply topically 4 (four) times daily as needed for dry skin.  Marland Kitchen lamoTRIgine (LAMICTAL) 25 MG tablet Take 25 mg by mouth 2 (two) times daily.   Marland Kitchen linagliptin (TRADJENTA) 5 MG TABS tablet Take 5 mg by mouth daily.  Marland Kitchen loratadine (CLARITIN) 10 MG tablet Take 10 mg by mouth daily.  . Melatonin 5 MG TABS Take 5 mg by mouth at bedtime.  . Menthol, Topical Analgesic, 4 % GEL Apply 1 application topically daily as needed (FOR PAIN).   Marland Kitchen metFORMIN (GLUCOPHAGE) 1000 MG tablet Take 1,000 mg by mouth 2 (two) times daily with a meal.   . montelukast (SINGULAIR) 10 MG tablet Take 1 tablet (10 mg total) by mouth at bedtime.  . NON FORMULARY Diet:  Consistent Carbohydrate,  . pantoprazole (PROTONIX) 40 MG tablet Take 40 mg by mouth every morning.  . polyethylene glycol (MIRALAX / GLYCOLAX) packet Take 17 g by mouth daily.  . Probiotic Product (RISA-BID PROBIOTIC PO) Take 1 tablet by mouth in the morning and at bedtime.  . senna-docusate (SENOKOT-S) 8.6-50 MG tablet Take 1 tablet by mouth at bedtime.   . simvastatin  (ZOCOR) 10 MG tablet Take 10 mg by mouth at bedtime.   . triamcinolone (NASACORT) 55 MCG/ACT AERO nasal inhaler Place 1 spray into the nose daily.  . valACYclovir (VALTREX) 500 MG tablet Take 500 mg by mouth daily.   . [DISCONTINUED] gabapentin (NEURONTIN) 300 MG capsule Take 300 mg by mouth 2 (two) times daily. Reported on 02/03/2016   No facility-administered encounter medications on file as of 11/19/2020.     SIGNIFICANT DIAGNOSTIC EXAMS   PREVIOUS  12-18-19: chest x-ray: Mild right upper lobe and bibasilar airspace opacities which could reflect early pneumonia (including atypical/viral infection).  12-17-18; ct of abdomen and pelvis: Slight  edema of the mucosa of the distal antrum of the stomach. This could represent gastritis. Otherwise, benign-appearing abdomen and pelvis.  07-16-20 DEXA: t score 0.825   NO NEW EXAMS.   LABS REVIEWED PREVIOUS;   12-16-19: urine culture: citrobacter youngae 12-18-19: wbc 16.6; hgb 14.7; hct 45.8; mcv 87.1 plt 330; glucose 168; bun 23; creat 0.85; k+ 3.7; na++ 136; ca 9.7; liver normal albumin 4.4 12-20-19: wbc 12.9; hgb 14.8; hct 48.2 ;mcv 90.4 plt 296 glucose 152; bun 33; creat 1.03 ;k+ 3.8; na++ 139; ca 9.7  01-02-20: wbc 14.5; hgb 12.9; hct 40.8 mcv 89.9 plt 171; glucose 128; bun 10; creat 0.80 ;k+ 4.3; na++ 138; ca 9.3 urine culture: multiple species.  02-24-20: chol 147; ldl 71; trig 120 hdl 52 hgb a1c 6.2 04-02-20; urine for micro-albumin <3.0  05-14-20: wbc 8.8; hgb 12.4; hct 39.7; mcv 88.4 plt 199; hgb a1c 6.8 vit B 12: 154  08-27-20: hepatitis C: neg 08-31-20: urine culture: multiple species 09-21-20: urine culture: e-coli: cipro  10-01-20: wbc 14.8; hgb 13.5; hct 43.3; mcv 87.7 plt 240; glucose 146; bun 19; creat 1.05; k+ 4.4; na=+ 136; ca 9.7 vitamin B 12: 877; folate 13.9; tsh 4.543  TODAY  11-16-20: wbc 11.7; hgb 12.7; hct 40.9; mcv 887 plt 213 urine culture: klebsiella pneumoniae: cipro   Review of Systems  Constitutional: Negative for  malaise/fatigue.  Respiratory: Negative for cough and shortness of breath.   Cardiovascular: Negative for chest pain, palpitations and leg swelling.  Gastrointestinal: Negative for abdominal pain, constipation and heartburn.  Genitourinary: Positive for dysuria and urgency.  Musculoskeletal: Negative for back pain, joint pain and myalgias.  Skin: Negative.   Neurological: Negative for dizziness.  Psychiatric/Behavioral: The patient is not nervous/anxious.       Physical Exam Constitutional:      General: She is not in acute distress.    Appearance: She is well-developed and well-nourished. She is not diaphoretic.  Neck:     Thyroid: No thyromegaly.  Cardiovascular:     Rate and Rhythm: Normal rate and regular rhythm.     Pulses: Normal pulses and intact distal pulses.     Heart sounds: Normal heart sounds.  Pulmonary:     Effort: Pulmonary effort is normal. No respiratory distress.     Breath sounds: Normal breath sounds.  Abdominal:     General: Bowel sounds are normal. There is no distension.     Palpations: Abdomen is soft.     Tenderness: There is no abdominal tenderness.  Musculoskeletal:        General: No edema. Normal range of motion.     Cervical back: Neck supple.     Right lower leg: No edema.     Left lower leg: No edema.  Lymphadenopathy:     Cervical: No cervical adenopathy.  Skin:    General: Skin is warm and dry.  Neurological:     Mental Status: She is alert. Mental status is at baseline.  Psychiatric:        Mood and Affect: Mood and affect and mood normal.      ASSESSMENT/ PLAN:   TODAY  1. Non-insulin dependent type 2 diabetes mellitus: is stable hgb a1c 6.8 will continue metformin 1 gm twice daily and tradjenta 5 mg daily   Is on statin and asa  2. Gastroesophageal reflux disease without esophagitis: is stable will continue protonix 40 mg daily   3. Chronic non-seasonal allergic rhinitis: is stable will continue claritin 10 mg daily and  singulair 10 mg daily   4. Acute UTI will treat with cipro 500 mg twice daily for one week. Will monitor   PREVIOUS   5. Herpes: is stable no recent outbreaks; will continue valtrex 500 mg daily   6. Vit B 12 deficiency: is stable level is 877 will continue 1000 mcg daily   7. Chronic constipation: is stable will continue miralax daily and senna s daily   8. Major depression recurrent chronic: is stable will continue cymbalta 60 mg daily lamictal 25 mg twice daily for mood and melatonin 5 mg nightly   9. Hypertension associated with type 2 diabetes mellitus: is stable b/p 139/81 will continue norvasc 10 mg daily asa 81 mg daily   10. Dyslipidemia associated with type 2 diabetes mellitus: is stable LDL 71; will continue zocor 10 mg daily     MD is aware of resident's narcotic use and is in agreement with current plan of care. We will attempt to wean resident as appropriate.  Synthia Innocent NP [facility]  Contact [phone] Monday through Friday 8am- 5pm  After hours call [phone]

## 2020-11-24 ENCOUNTER — Non-Acute Institutional Stay (SKILLED_NURSING_FACILITY): Payer: Medicare Other | Admitting: Adult Health

## 2020-11-24 ENCOUNTER — Encounter: Payer: Self-pay | Admitting: Adult Health

## 2020-11-24 DIAGNOSIS — R52 Pain, unspecified: Secondary | ICD-10-CM

## 2020-11-24 DIAGNOSIS — G8929 Other chronic pain: Secondary | ICD-10-CM | POA: Diagnosis not present

## 2020-11-24 DIAGNOSIS — F339 Major depressive disorder, recurrent, unspecified: Secondary | ICD-10-CM | POA: Diagnosis not present

## 2020-11-24 NOTE — Progress Notes (Signed)
Location:  [facility] Room Number: 115-W Place of Service:  SNF (31)   CODE STATUS: Full Code   Allergies  Allergen Reactions   Lisinopril    Penicillins Nausea Only    Has patient had a PCN reaction causing immediate rash, facial/tongue/throat swelling, SOB or lightheadedness with hypotension: Yes Has patient had a PCN reaction causing severe rash involving mucus membranes or skin necrosis: No Has patient had a PCN reaction that required hospitalization No Has patient had a PCN reaction occurring within the last 10 years: No If all of the above answers are "NO", then may proceed with Cephalosporin use.    Sulfa Antibiotics Nausea And Vomiting    Chief Complaint  Patient presents with   Acute Visit    Medication review    HPI:  She is presently taking cymbalta 60 mg daily lamictal 25 mg twice daily (has failed one wean). Her ultram was stopped due to serotonin toxicity. Her lamictal had to be returned to 25 mg twice daily as her mood state declined. There are no reports of delusions; hallucinations; anxiety or depressive thoughts. She is due for a GDR: will need to lower her cymbalta to 40 mg daily   Past Medical History:  Diagnosis Date   Anxiety    Bowel obstruction (HCC)    Burning with urination 01/04/2016   Diabetes mellitus without complication (HCC)    Genital herpes    GERD (gastroesophageal reflux disease)    Hematuria 01/04/2016   Herpes 12/22/2015   Hyperlipidemia    Hypertension    LLQ abdominal tenderness 01/04/2016   Neuropathy    Pelvic pressure in female 01/04/2016   Recurrent UTI    Sciatic nerve disease    Small bowel obstruction (HCC) 04/27/2016   Urinary frequency 01/04/2016   Urticaria     Past Surgical History:  Procedure Laterality Date   ABDOMINAL HYSTERECTOMY     APPLICATION OF INTRAOPERATIVE CT SCAN N/A 12/19/2018   Procedure: APPLICATION OF INTRAOPERATIVE CT SCAN;  Surgeon: Maeola Harman, MD;   Location: [facility] OR;  Service: Neurosurgery;  Laterality: N/A;   bowel obstruction     COLON SURGERY     blocked colon   POSTERIOR CERVICAL FUSION/FORAMINOTOMY N/A 12/19/2018   Procedure: Cervical Seven to Thoracic One Posterior cervical fusion;  Surgeon: Maeola Harman, MD;  Location: [facility] OR;  Service: Neurosurgery;  Laterality: N/A;  Cervical Seven to Thoracic One Posterior cervical fusion    Social History   Socioeconomic History   Marital status: Single    Spouse name: Not on file   Number of children: Not on file   Years of education: Not on file   Highest education level: Not on file  Occupational History   Not on file  Tobacco Use   Smoking status: Never Smoker   Smokeless tobacco: Current User    Types: Snuff  Vaping Use   Vaping Use: Never used  Substance and Sexual Activity   Alcohol use: No   Drug use: No   Sexual activity: Not Currently    Birth control/protection: Surgical    Comment: hyst  Other Topics Concern   Not on file  Social History Narrative   Not on file   Social Determinants of Health   Financial Resource Strain: Not on file  Food Insecurity: Not on file  Transportation Needs: Not on file  Physical Activity: Not on file  Stress: Not on file  Social Connections: Not on file  Intimate Partner Violence:  Not on file   Family History  Problem Relation Age of Onset   Diabetes Mother    Heart disease Brother    Hypertension Brother    Diabetes Brother    Hypertension Daughter    Diabetes Brother    Diabetes Brother    Alzheimer's disease Brother    Anxiety disorder Daughter       VITAL SIGNS BP (!) 153/80    Pulse 67    Temp (!) 97.2 F (36.2 C)    Resp 18    Ht 5' (1.524 m)    Wt 178 lb 9.6 oz (81 kg)    SpO2 98%    BMI 34.88 kg/m   Outpatient Encounter Medications as of 11/24/2020  Medication Sig   acetaminophen (TYLENOL) 325 MG tablet Take 650 mg by mouth every 8 (eight) hours as needed. As needed for pain or HA.  Max 3gm acetaminophen/24 hrs from all sources.   acetaminophen (TYLENOL) 650 MG CR tablet Take 650 mg by mouth at bedtime.   amLODipine (NORVASC) 10 MG tablet Take 1 tablet (10 mg total) by mouth daily.   aspirin 81 MG chewable tablet Chew 81 mg by mouth daily.   Balsam [place]-Castor Oil (VENELEX) OINT Apply topically. apply to residents buttocks q shift PRN Every Shift - PRN   ciprofloxacin (CIPRO) 500 MG tablet Take 500 mg by mouth 2 (two) times daily.   cyanocobalamin 1000 MCG tablet Take 1,000 mcg by mouth daily. Special Instructions: for vit B 12 deficiency   DULoxetine (CYMBALTA) 60 MG capsule Take 60 mg by mouth daily.    Emollient (EUCERIN) lotion Apply topically 4 (four) times daily as needed for dry skin.   lamoTRIgine (LAMICTAL) 25 MG tablet Take 25 mg by mouth 2 (two) times daily.    linagliptin (TRADJENTA) 5 MG TABS tablet Take 5 mg by mouth daily.   loratadine (CLARITIN) 10 MG tablet Take 10 mg by mouth daily.   Melatonin 5 MG TABS Take 5 mg by mouth at bedtime.   Menthol, Topical Analgesic, 4 % GEL Apply 1 application topically daily as needed (FOR PAIN).    metFORMIN (GLUCOPHAGE) 1000 MG tablet Take 1,000 mg by mouth 2 (two) times daily with a meal.    montelukast (SINGULAIR) 10 MG tablet Take 1 tablet (10 mg total) by mouth at bedtime.   NON FORMULARY Diet:  Consistent Carbohydrate,   pantoprazole (PROTONIX) 40 MG tablet Take 40 mg by mouth every morning.   polyethylene glycol (MIRALAX / GLYCOLAX) packet Take 17 g by mouth daily.   Probiotic Product (RISA-BID PROBIOTIC PO) Take 1 tablet by mouth in the morning and at bedtime.   senna-docusate (SENOKOT-S) 8.6-50 MG tablet Take 1 tablet by mouth at bedtime.    simvastatin (ZOCOR) 10 MG tablet Take 10 mg by mouth at bedtime.    triamcinolone (NASACORT) 55 MCG/ACT AERO nasal inhaler Place 1 spray into the nose daily.   valACYclovir (VALTREX) 500 MG tablet Take 500 mg by mouth daily.    [DISCONTINUED]  gabapentin (NEURONTIN) 300 MG capsule Take 300 mg by mouth 2 (two) times daily. Reported on 02/03/2016   No facility-administered encounter medications on file as of 11/24/2020.     SIGNIFICANT DIAGNOSTIC EXAMS   PREVIOUS  12-18-19: chest x-ray: Mild right upper lobe and bibasilar airspace opacities which could reflect early pneumonia (including atypical/viral infection).  12-17-18; ct of abdomen and pelvis: Slight edema of the mucosa of the distal antrum of the stomach. This could represent  gastritis. Otherwise, benign-appearing abdomen and pelvis.  07-16-20 DEXA: t score 0.825   NO NEW EXAMS.   LABS REVIEWED PREVIOUS;   12-16-19: urine culture: citrobacter youngae 12-18-19: wbc 16.6; hgb 14.7; hct 45.8; mcv 87.1 plt 330; glucose 168; bun 23; creat 0.85; k+ 3.7; na++ 136; ca 9.7; liver normal albumin 4.4 12-20-19: wbc 12.9; hgb 14.8; hct 48.2 ;mcv 90.4 plt 296 glucose 152; bun 33; creat 1.03 ;k+ 3.8; na++ 139; ca 9.7  01-02-20: wbc 14.5; hgb 12.9; hct 40.8 mcv 89.9 plt 171; glucose 128; bun 10; creat 0.80 ;k+ 4.3; na++ 138; ca 9.3 urine culture: multiple species.  02-24-20: chol 147; ldl 71; trig 120 hdl 52 hgb a1c 6.2 04-02-20; urine for micro-albumin <3.0  05-14-20: wbc 8.8; hgb 12.4; hct 39.7; mcv 88.4 plt 199; hgb a1c 6.8 vit B 12: 154  08-27-20: hepatitis C: neg 08-31-20: urine culture: multiple species 09-21-20: urine culture: e-coli: cipro  10-01-20: wbc 14.8; hgb 13.5; hct 43.3; mcv 87.7 plt 240; glucose 146; bun 19; creat 1.05; k+ 4.4; na++ 136; ca 9.7 vitamin B 12: 877; folate 13.9; tsh 4.543 11-16-20: wbc 11.7; hgb 12.7; hct 40.9; mcv 887 plt 213 urine culture: klebsiella pneumoniae: cipro  NO NEW LABS.    Review of Systems  Constitutional: Negative for malaise/fatigue.  Respiratory: Negative for cough and shortness of breath.   Cardiovascular: Negative for chest pain, palpitations and leg swelling.  Gastrointestinal: Negative for abdominal pain, constipation and heartburn.   Musculoskeletal: Negative for back pain, joint pain and myalgias.  Skin: Negative.   Neurological: Negative for dizziness.  Psychiatric/Behavioral: The patient is not nervous/anxious.     Physical Exam Constitutional:      General: She is not in acute distress.    Appearance: She is well-developed and well-nourished. She is not diaphoretic.  Neck:     Thyroid: No thyromegaly.  Cardiovascular:     Rate and Rhythm: Normal rate and regular rhythm.     Pulses: Normal pulses and intact distal pulses.     Heart sounds: Normal heart sounds.  Pulmonary:     Effort: Pulmonary effort is normal. No respiratory distress.     Breath sounds: Normal breath sounds.  Abdominal:     General: Bowel sounds are normal. There is no distension.     Palpations: Abdomen is soft.     Tenderness: There is no abdominal tenderness.  Musculoskeletal:        General: No edema. Normal range of motion.     Cervical back: Neck supple.     Right lower leg: No edema.     Left lower leg: No edema.  Lymphadenopathy:     Cervical: No cervical adenopathy.  Skin:    General: Skin is warm and dry.  Neurological:     Mental Status: She is alert. Mental status is at baseline.  Psychiatric:        Mood and Affect: Mood and affect and mood normal.      ASSESSMENT/ PLAN:  TODAY  1. Chronic depression major recurrent 2. Chronic generalized pain.   She is off ultram and is tolerating without difficulty Will lower her cymbalta to 40 mg daily  Will continue lamictal 25 mg twice daily  Will continue to monitor her responsive.    MD is aware of resident's narcotic use and is in agreement with current plan of care. We will attempt to wean resident as appropriate.  Synthia Innocent NP [facility]  Contact [phone] Monday through Friday 8am-  5pm  After hours call [phone]

## 2020-11-26 ENCOUNTER — Non-Acute Institutional Stay (SKILLED_NURSING_FACILITY): Payer: Medicare Other | Admitting: Adult Health

## 2020-11-26 ENCOUNTER — Encounter: Payer: Self-pay | Admitting: Adult Health

## 2020-11-26 DIAGNOSIS — E1159 Type 2 diabetes mellitus with other circulatory complications: Secondary | ICD-10-CM | POA: Diagnosis not present

## 2020-11-26 DIAGNOSIS — I152 Hypertension secondary to endocrine disorders: Secondary | ICD-10-CM | POA: Diagnosis not present

## 2020-11-26 DIAGNOSIS — G9520 Unspecified cord compression: Secondary | ICD-10-CM | POA: Diagnosis not present

## 2020-11-26 DIAGNOSIS — F339 Major depressive disorder, recurrent, unspecified: Secondary | ICD-10-CM

## 2020-11-26 NOTE — Progress Notes (Signed)
Location:  [facility] Room Number: 115/W Place of Service:  SNF (31)   CODE STATUS: Full Code  Allergies  Allergen Reactions   Lisinopril    Penicillins Nausea Only    Has patient had a PCN reaction causing immediate rash, facial/tongue/throat swelling, SOB or lightheadedness with hypotension: Yes Has patient had a PCN reaction causing severe rash involving mucus membranes or skin necrosis: No Has patient had a PCN reaction that required hospitalization No Has patient had a PCN reaction occurring within the last 10 years: No If all of the above answers are "NO", then may proceed with Cephalosporin use.    Sulfa Antibiotics Nausea And Vomiting    Chief Complaint  Patient presents with   Acute Visit    Care Plan Meeting     HPI:  We have come together for her care plan meeting. Unable to do BIMS; mood 0/30. There are no reports of falls. She is independent to supervision with her adls. She is occasionally incontinent of bladder and bowel. She feeds herself. Her weight is stable at 178.6 pounds with a good appetite. She has had her cymbalta lowered to 40 mg daily her cbg readings have been normal. There are no reports of uncontrolled pain. She continues to be followed for her chronic illnesses including: Major depression chronic recurrent Cord compression  Hypertension associated with type 2 diabetes mellitus  Past Medical History:  Diagnosis Date   Anxiety    Bowel obstruction (HCC)    Burning with urination 01/04/2016   Diabetes mellitus without complication (HCC)    Genital herpes    GERD (gastroesophageal reflux disease)    Hematuria 01/04/2016   Herpes 12/22/2015   Hyperlipidemia    Hypertension    LLQ abdominal tenderness 01/04/2016   Neuropathy    Pelvic pressure in female 01/04/2016   Recurrent UTI    Sciatic nerve disease    Small bowel obstruction (HCC) 04/27/2016   Urinary frequency 01/04/2016   Urticaria     Past  Surgical History:  Procedure Laterality Date   ABDOMINAL HYSTERECTOMY     APPLICATION OF INTRAOPERATIVE CT SCAN N/A 12/19/2018   Procedure: APPLICATION OF INTRAOPERATIVE CT SCAN;  Surgeon: Maeola HarmanStern, Joseph, MD;  [facility];  Service: Neurosurgery;  Laterality: N/A;   bowel obstruction     COLON SURGERY     blocked colon   POSTERIOR CERVICAL FUSION/FORAMINOTOMY N/A 12/19/2018   Procedure: Cervical Seven to Thoracic One Posterior cervical fusion;  Surgeon: Maeola HarmanStern, Joseph, MD;  [facility];  Service: Neurosurgery;  Laterality: N/A;  Cervical Seven to Thoracic One Posterior cervical fusion    Social History   Socioeconomic History   Marital status: Single    Spouse name: Not on file   Number of children: Not on file   Years of education: Not on file   Highest education level: Not on file  Occupational History   Not on file  Tobacco Use   Smoking status: Never Smoker   Smokeless tobacco: Current User    Types: Snuff  Vaping Use   Vaping Use: Never used  Substance and Sexual Activity   Alcohol use: No   Drug use: No   Sexual activity: Not Currently    Birth control/protection: Surgical    Comment: hyst  Other Topics Concern   Not on file  Social History Narrative   Not on file   Social Determinants of Health   Financial Resource Strain: Not on file  Food Insecurity:  Not on file  Transportation Needs: Not on file  Physical Activity: Not on file  Stress: Not on file  Social Connections: Not on file  Intimate Partner Violence: Not on file   Family History  Problem Relation Age of Onset   Diabetes Mother    Heart disease Brother    Hypertension Brother    Diabetes Brother    Hypertension Daughter    Diabetes Brother    Diabetes Brother    Alzheimer's disease Brother    Anxiety disorder Daughter       VITAL SIGNS BP (!) 153/80    Pulse 67    Temp 98 F (36.7 C)    Ht 5' (1.524 m)    Wt 178 lb 9.6 oz (81 kg)    BMI 34.88 kg/m    Outpatient Encounter Medications as of 11/26/2020  Medication Sig   acetaminophen (TYLENOL) 325 MG tablet Take 650 mg by mouth every 8 (eight) hours as needed. As needed for pain or HA. Max 3gm acetaminophen/24 hrs from all sources.   acetaminophen (TYLENOL) 650 MG CR tablet Take 650 mg by mouth at bedtime.   amLODipine (NORVASC) 10 MG tablet Take 1 tablet (10 mg total) by mouth daily.   aspirin 81 MG chewable tablet Chew 81 mg by mouth daily.   Balsam [place]-Castor Oil (VENELEX) OINT Apply topically. apply to residents buttocks q shift PRN Every Shift - PRN   ciprofloxacin (CIPRO) 500 MG tablet Take 500 mg by mouth 2 (two) times daily.   cyanocobalamin 1000 MCG tablet Take 1,000 mcg by mouth daily. Special Instructions: for vit B 12 deficiency   DULoxetine (CYMBALTA) 60 MG capsule Take 60 mg by mouth daily.    Emollient (EUCERIN) lotion Apply topically 4 (four) times daily as needed for dry skin.   lamoTRIgine (LAMICTAL) 25 MG tablet Take 25 mg by mouth 2 (two) times daily.    linagliptin (TRADJENTA) 5 MG TABS tablet Take 5 mg by mouth daily.   loratadine (CLARITIN) 10 MG tablet Take 10 mg by mouth daily.   Melatonin 5 MG TABS Take 5 mg by mouth at bedtime.   Menthol, Topical Analgesic, 4 % GEL Apply 1 application topically daily as needed (FOR PAIN).    metFORMIN (GLUCOPHAGE) 1000 MG tablet Take 1,000 mg by mouth 2 (two) times daily with a meal.    montelukast (SINGULAIR) 10 MG tablet Take 1 tablet (10 mg total) by mouth at bedtime.   NON FORMULARY Diet:  Consistent Carbohydrate,   pantoprazole (PROTONIX) 40 MG tablet Take 40 mg by mouth every morning.   polyethylene glycol (MIRALAX / GLYCOLAX) packet Take 17 g by mouth daily.   Probiotic Product (RISA-BID PROBIOTIC PO) Take 1 tablet by mouth in the morning and at bedtime.   senna-docusate (SENOKOT-S) 8.6-50 MG tablet Take 1 tablet by mouth at bedtime.    simvastatin (ZOCOR) 10 MG tablet Take 10 mg by mouth at  bedtime.    triamcinolone (NASACORT) 55 MCG/ACT AERO nasal inhaler Place 1 spray into the nose daily.   valACYclovir (VALTREX) 500 MG tablet Take 500 mg by mouth daily.    [DISCONTINUED] gabapentin (NEURONTIN) 300 MG capsule Take 300 mg by mouth 2 (two) times daily. Reported on 02/03/2016   No facility-administered encounter medications on file as of 11/26/2020.     SIGNIFICANT DIAGNOSTIC EXAMS  PREVIOUS  12-18-19: chest x-ray: Mild right upper lobe and bibasilar airspace opacities which could reflect early pneumonia (including atypical/viral infection).  12-17-18; ct of  abdomen and pelvis: Slight edema of the mucosa of the distal antrum of the stomach. This could represent gastritis. Otherwise, benign-appearing abdomen and pelvis.  07-16-20 DEXA: t score 0.825   NO NEW EXAMS.   LABS REVIEWED PREVIOUS;   12-16-19: urine culture: citrobacter youngae 12-18-19: wbc 16.6; hgb 14.7; hct 45.8; mcv 87.1 plt 330; glucose 168; bun 23; creat 0.85; k+ 3.7; na++ 136; ca 9.7; liver normal albumin 4.4 12-20-19: wbc 12.9; hgb 14.8; hct 48.2 ;mcv 90.4 plt 296 glucose 152; bun 33; creat 1.03 ;k+ 3.8; na++ 139; ca 9.7  01-02-20: wbc 14.5; hgb 12.9; hct 40.8 mcv 89.9 plt 171; glucose 128; bun 10; creat 0.80 ;k+ 4.3; na++ 138; ca 9.3 urine culture: multiple species.  02-24-20: chol 147; ldl 71; trig 120 hdl 52 hgb a1c 6.2 04-02-20; urine for micro-albumin <3.0  05-14-20: wbc 8.8; hgb 12.4; hct 39.7; mcv 88.4 plt 199; hgb a1c 6.8 vit B 12: 154  08-27-20: hepatitis C: neg 08-31-20: urine culture: multiple species 09-21-20: urine culture: e-coli: cipro  10-01-20: wbc 14.8; hgb 13.5; hct 43.3; mcv 87.7 plt 240; glucose 146; bun 19; creat 1.05; k+ 4.4; na++ 136; ca 9.7 vitamin B 12: 877; folate 13.9; tsh 4.543 11-16-20: wbc 11.7; hgb 12.7; hct 40.9; mcv 887 plt 213 urine culture: klebsiella pneumoniae: cipro  NO NEW LABS.     Review of Systems  Constitutional: Negative for malaise/fatigue.  Respiratory: Negative for  cough and shortness of breath.   Cardiovascular: Negative for chest pain, palpitations and leg swelling.  Gastrointestinal: Negative for abdominal pain, constipation and heartburn.  Musculoskeletal: Negative for back pain, joint pain and myalgias.  Skin: Negative.   Neurological: Negative for dizziness.  Psychiatric/Behavioral: The patient is not nervous/anxious.     Physical Exam Constitutional:      General: She is not in acute distress.    Appearance: She is well-developed and well-nourished. She is not diaphoretic.  Neck:     Thyroid: No thyromegaly.  Cardiovascular:     Rate and Rhythm: Normal rate and regular rhythm.     Pulses: Normal pulses and intact distal pulses.     Heart sounds: Normal heart sounds.  Pulmonary:     Effort: Pulmonary effort is normal. No respiratory distress.     Breath sounds: Normal breath sounds.  Abdominal:     General: Bowel sounds are normal. There is no distension.     Palpations: Abdomen is soft.     Tenderness: There is no abdominal tenderness.  Musculoskeletal:        General: No edema. Normal range of motion.     Cervical back: Neck supple.     Right lower leg: No edema.     Left lower leg: No edema.  Lymphadenopathy:     Cervical: No cervical adenopathy.  Skin:    General: Skin is warm and dry.  Neurological:     Mental Status: She is alert. Mental status is at baseline.  Psychiatric:        Mood and Affect: Mood and affect and mood normal.       ASSESSMENT/ PLAN:  TODAY  1. Major depression chronic recurrent 2. Cord compression  3. Hypertension associated with type 2 diabetes mellitus   Will continue current medications  Will continue current plan of care Will continue to monitor her status.   MD is aware of resident's narcotic use and is in agreement with current plan of care. We will attempt to wean resident as appropriate.  Gavin Pound  Kiarrah Rausch NP [facility]  Contact [phone] Monday through Friday 8am-  5pm  After hours call [phone]

## 2020-12-14 DIAGNOSIS — F0390 Unspecified dementia without behavioral disturbance: Secondary | ICD-10-CM | POA: Diagnosis not present

## 2020-12-14 DIAGNOSIS — Z1159 Encounter for screening for other viral diseases: Secondary | ICD-10-CM | POA: Diagnosis not present

## 2020-12-19 ENCOUNTER — Encounter (HOSPITAL_COMMUNITY)
Admission: AD | Admit: 2020-12-19 | Discharge: 2020-12-19 | Disposition: A | Discharge status: Home / Self Care | Payer: Medicare Other | Source: Skilled Nursing Facility | Attending: Adult Health | Admitting: Adult Health

## 2020-12-19 DIAGNOSIS — N390 Urinary tract infection, site not specified: Secondary | ICD-10-CM | POA: Insufficient documentation

## 2020-12-19 DIAGNOSIS — B9620 Unspecified Escherichia coli [E. coli] as the cause of diseases classified elsewhere: Secondary | ICD-10-CM | POA: Insufficient documentation

## 2020-12-19 LAB — URINALYSIS, ROUTINE W REFLEX MICROSCOPIC
Bilirubin Urine: NEGATIVE
Glucose, UA: 50 mg/dL — AB
Hgb urine dipstick: NEGATIVE
Ketones, ur: NEGATIVE mg/dL
Nitrite: NEGATIVE
Protein, ur: NEGATIVE mg/dL
Specific Gravity, Urine: 1.016 (ref 1.005–1.030)
WBC, UA: >50 WBC/hpf — ABNORMAL HIGH (ref 0–5)
pH: 5 (ref 5.0–8.0)

## 2020-12-21 ENCOUNTER — Encounter: Payer: Self-pay | Admitting: Adult Health

## 2020-12-21 ENCOUNTER — Non-Acute Institutional Stay (SKILLED_NURSING_FACILITY): Payer: Medicare Other | Admitting: Adult Health

## 2020-12-21 DIAGNOSIS — B9620 Unspecified Escherichia coli [E. coli] as the cause of diseases classified elsewhere: Secondary | ICD-10-CM | POA: Diagnosis not present

## 2020-12-21 DIAGNOSIS — N390 Urinary tract infection, site not specified: Secondary | ICD-10-CM

## 2020-12-21 NOTE — Progress Notes (Signed)
Location:  [facility] Room Number: 115/W Place of Service:  SNF (31)   CODE STATUS: Full Code  Allergies  Allergen Reactions  . Lisinopril   . Penicillins Nausea Only    Has patient had a PCN reaction causing immediate rash, facial/tongue/throat swelling, SOB or lightheadedness with hypotension: Yes Has patient had a PCN reaction causing severe rash involving mucus membranes or skin necrosis: No Has patient had a PCN reaction that required hospitalization No Has patient had a PCN reaction occurring within the last 10 years: No If all of the above answers are "NO", then may proceed with Cephalosporin use.   . Sulfa Antibiotics Nausea And Vomiting    Chief Complaint  Patient presents with  . Acute Visit    UTI    HPI:  She had a urine culture performed due to her dysuria; frequency and pelvic pressure. There are no reports of fevers. Her culture has grown out e-coli her sensitivity has not yet returned. She tells me today that she is not feeling. Will go ahead and start her abt and will chnge abt if needed.   Past Medical History:  Diagnosis Date  . Anxiety   . Bowel obstruction (HCC)   . Burning with urination 01/04/2016  . Diabetes mellitus without complication (HCC)   . Genital herpes   . GERD (gastroesophageal reflux disease)   . Hematuria 01/04/2016  . Herpes 12/22/2015  . Hyperlipidemia   . Hypertension   . LLQ abdominal tenderness 01/04/2016  . Neuropathy   . Pelvic pressure in female 01/04/2016  . Recurrent UTI   . Sciatic nerve disease   . Small bowel obstruction (HCC) 04/27/2016  . Urinary frequency 01/04/2016  . Urticaria     Past Surgical History:  Procedure Laterality Date  . ABDOMINAL HYSTERECTOMY    . APPLICATION OF INTRAOPERATIVE CT SCAN N/A 12/19/2018   Procedure: APPLICATION OF INTRAOPERATIVE CT SCAN;  Surgeon: Maeola Harman, MD;  Location: [facility] OR;  Service: Neurosurgery;  Laterality: N/A;  . bowel obstruction    . COLON SURGERY      blocked colon  . POSTERIOR CERVICAL FUSION/FORAMINOTOMY N/A 12/19/2018   Procedure: Cervical Seven to Thoracic One Posterior cervical fusion;  Surgeon: Maeola Harman, MD;  Location: [facility] OR;  Service: Neurosurgery;  Laterality: N/A;  Cervical Seven to Thoracic One Posterior cervical fusion    Social History   Socioeconomic History  . Marital status: Single    Spouse name: Not on file  . Number of children: Not on file  . Years of education: Not on file  . Highest education level: Not on file  Occupational History  . Not on file  Tobacco Use  . Smoking status: Never Smoker  . Smokeless tobacco: Current User    Types: Snuff  Vaping Use  . Vaping Use: Never used  Substance and Sexual Activity  . Alcohol use: No  . Drug use: No  . Sexual activity: Not Currently    Birth control/protection: Surgical    Comment: hyst  Other Topics Concern  . Not on file  Social History Narrative  . Not on file   Social Determinants of Health   Financial Resource Strain: Not on file  Food Insecurity: Not on file  Transportation Needs: Not on file  Physical Activity: Not on file  Stress: Not on file  Social Connections: Not on file  Intimate Partner Violence: Not on file   Family History  Problem Relation Age of Onset  . Diabetes  Mother   . Heart disease Brother   . Hypertension Brother   . Diabetes Brother   . Hypertension Daughter   . Diabetes Brother   . Diabetes Brother   . Alzheimer's disease Brother   . Anxiety disorder Daughter       VITAL SIGNS BP (!) 153/85   Pulse 74   Temp 98 F (36.7 C)   Ht 5' (1.524 m)   Wt 183 lb (83 kg)   BMI 35.74 kg/m   Outpatient Encounter Medications as of 12/21/2020  Medication Sig  . acetaminophen (TYLENOL) 325 MG tablet Take 650 mg by mouth every 8 (eight) hours as needed. As needed for pain or HA. Max 3gm acetaminophen/24 hrs from all sources.  Marland Kitchen acetaminophen (TYLENOL) 650 MG CR tablet Take 650 mg by mouth at bedtime.  Marland Kitchen  amLODipine (NORVASC) 10 MG tablet Take 1 tablet (10 mg total) by mouth daily.  Marland Kitchen aspirin 81 MG chewable tablet Chew 81 mg by mouth daily.  Lucilla Lame [place]-Castor Oil (VENELEX) OINT Apply topically. apply to residents buttocks q shift PRN Every Shift - PRN  . cyanocobalamin 1000 MCG tablet Take 1,000 mcg by mouth daily. Special Instructions: for vit B 12 deficiency  . DULoxetine (CYMBALTA) 20 MG capsule Take 40 mg by mouth daily.  . Emollient (EUCERIN) lotion Apply topically 4 (four) times daily as needed for dry skin.  Marland Kitchen lamoTRIgine (LAMICTAL) 25 MG tablet Take 25 mg by mouth 2 (two) times daily.   Marland Kitchen linagliptin (TRADJENTA) 5 MG TABS tablet Take 5 mg by mouth daily.  Marland Kitchen loratadine (CLARITIN) 10 MG tablet Take 10 mg by mouth daily.  . Melatonin 5 MG TABS Take 5 mg by mouth at bedtime.  . Menthol, Topical Analgesic, 4 % GEL Apply 1 application topically daily as needed (FOR PAIN).   Marland Kitchen metFORMIN (GLUCOPHAGE) 1000 MG tablet Take 1,000 mg by mouth 2 (two) times daily with a meal.   . montelukast (SINGULAIR) 10 MG tablet Take 1 tablet (10 mg total) by mouth at bedtime.  . nitrofurantoin, macrocrystal-monohydrate, (MACROBID) 100 MG capsule Take 100 mg by mouth 2 (two) times daily.  . NON FORMULARY Diet:  Consistent Carbohydrate,  . pantoprazole (PROTONIX) 40 MG tablet Take 40 mg by mouth every morning.  . polyethylene glycol (MIRALAX / GLYCOLAX) packet Take 17 g by mouth daily.  Marland Kitchen senna-docusate (SENOKOT-S) 8.6-50 MG tablet Take 1 tablet by mouth at bedtime.   . simvastatin (ZOCOR) 10 MG tablet Take 10 mg by mouth at bedtime.   . triamcinolone (NASACORT) 55 MCG/ACT AERO nasal inhaler Place 1 spray into the nose daily.  . valACYclovir (VALTREX) 500 MG tablet Take 500 mg by mouth daily.   . [DISCONTINUED] DULoxetine (CYMBALTA) 60 MG capsule Take by mouth daily.  . [DISCONTINUED] gabapentin (NEURONTIN) 300 MG capsule Take 300 mg by mouth 2 (two) times daily. Reported on 02/03/2016   No  facility-administered encounter medications on file as of 12/21/2020.     SIGNIFICANT DIAGNOSTIC EXAMS    PREVIOUS  12-18-19: chest x-ray: Mild right upper lobe and bibasilar airspace opacities which could reflect early pneumonia (including atypical/viral infection).  12-17-18; ct of abdomen and pelvis: Slight edema of the mucosa of the distal antrum of the stomach. This could represent gastritis. Otherwise, benign-appearing abdomen and pelvis.  07-16-20 DEXA: t score 0.825   NO NEW EXAMS.   LABS REVIEWED PREVIOUS;   12-16-19: urine culture: citrobacter youngae 12-18-19: wbc 16.6; hgb 14.7; hct 45.8; mcv 87.1 plt  330; glucose 168; bun 23; creat 0.85; k+ 3.7; na++ 136; ca 9.7; liver normal albumin 4.4 12-20-19: wbc 12.9; hgb 14.8; hct 48.2 ;mcv 90.4 plt 296 glucose 152; bun 33; creat 1.03 ;k+ 3.8; na++ 139; ca 9.7  01-02-20: wbc 14.5; hgb 12.9; hct 40.8 mcv 89.9 plt 171; glucose 128; bun 10; creat 0.80 ;k+ 4.3; na++ 138; ca 9.3 urine culture: multiple species.  02-24-20: chol 147; ldl 71; trig 120 hdl 52 hgb a1c 6.2 04-02-20; urine for micro-albumin <3.0  05-14-20: wbc 8.8; hgb 12.4; hct 39.7; mcv 88.4 plt 199; hgb a1c 6.8 vit B 12: 154  08-27-20: hepatitis C: neg 08-31-20: urine culture: multiple species 09-21-20: urine culture: e-coli: cipro  10-01-20: wbc 14.8; hgb 13.5; hct 43.3; mcv 87.7 plt 240; glucose 146; bun 19; creat 1.05; k+ 4.4; na++ 136; ca 9.7 vitamin B 12: 877; folate 13.9; tsh 4.543 11-16-20: wbc 11.7; hgb 12.7; hct 40.9; mcv 887 plt 213 urine culture: klebsiella pneumoniae: cipro  TODAY  12-19-20: urine culture: e-coli   Review of Systems  Constitutional: Negative for malaise/fatigue.  Respiratory: Negative for cough and shortness of breath.   Cardiovascular: Negative for chest pain, palpitations and leg swelling.  Gastrointestinal: Negative for abdominal pain, constipation and heartburn.  Genitourinary: Positive for dysuria and frequency.       Pelvic pressure    Musculoskeletal: Negative for back pain, joint pain and myalgias.  Skin: Negative.   Neurological: Negative for dizziness.  Psychiatric/Behavioral: The patient is not nervous/anxious.      Physical Exam Constitutional:      General: She is not in acute distress.    Appearance: She is well-developed and well-nourished. She is not diaphoretic.  Neck:     Thyroid: No thyromegaly.  Cardiovascular:     Rate and Rhythm: Normal rate and regular rhythm.     Pulses: Normal pulses and intact distal pulses.     Heart sounds: Normal heart sounds.  Pulmonary:     Effort: Pulmonary effort is normal. No respiratory distress.     Breath sounds: Normal breath sounds.  Abdominal:     General: Bowel sounds are normal. There is no distension.     Palpations: Abdomen is soft.     Tenderness: There is no abdominal tenderness.  Musculoskeletal:        General: No edema. Normal range of motion.     Cervical back: Neck supple.  Lymphadenopathy:     Cervical: No cervical adenopathy.  Skin:    General: Skin is warm and dry.  Neurological:     Mental Status: She is alert. Mental status is at baseline.  Psychiatric:        Mood and Affect: Mood and affect and mood normal.        ASSESSMENT/ PLAN:  TODAY  1. E-coli UTI: will begin    macrobid 100 mg twice daily for 5 days    MD is aware of resident's narcotic use and is in agreement with current plan of care. We will attempt to wean resident as appropriate.  Synthia Innocent NP [facility]  Contact [phone] Monday through Friday 8am- 5pm  After hours call [phone]

## 2020-12-22 LAB — URINE CULTURE: Culture: >=100000 — AB

## 2020-12-23 ENCOUNTER — Non-Acute Institutional Stay (SKILLED_NURSING_FACILITY): Payer: Medicare Other | Admitting: Adult Health

## 2020-12-23 ENCOUNTER — Encounter: Payer: Self-pay | Admitting: Adult Health

## 2020-12-23 DIAGNOSIS — E119 Type 2 diabetes mellitus without complications: Secondary | ICD-10-CM

## 2020-12-23 DIAGNOSIS — E785 Hyperlipidemia, unspecified: Secondary | ICD-10-CM

## 2020-12-23 DIAGNOSIS — E1159 Type 2 diabetes mellitus with other circulatory complications: Secondary | ICD-10-CM

## 2020-12-23 DIAGNOSIS — K5909 Other constipation: Secondary | ICD-10-CM

## 2020-12-23 DIAGNOSIS — I152 Hypertension secondary to endocrine disorders: Secondary | ICD-10-CM

## 2020-12-23 DIAGNOSIS — E538 Deficiency of other specified B group vitamins: Secondary | ICD-10-CM

## 2020-12-23 DIAGNOSIS — J3089 Other allergic rhinitis: Secondary | ICD-10-CM | POA: Diagnosis not present

## 2020-12-23 DIAGNOSIS — I700 Atherosclerosis of aorta: Secondary | ICD-10-CM | POA: Diagnosis not present

## 2020-12-23 DIAGNOSIS — K219 Gastro-esophageal reflux disease without esophagitis: Secondary | ICD-10-CM

## 2020-12-23 DIAGNOSIS — F0390 Unspecified dementia without behavioral disturbance: Secondary | ICD-10-CM | POA: Diagnosis not present

## 2020-12-23 DIAGNOSIS — E1169 Type 2 diabetes mellitus with other specified complication: Secondary | ICD-10-CM

## 2020-12-23 DIAGNOSIS — F339 Major depressive disorder, recurrent, unspecified: Secondary | ICD-10-CM

## 2020-12-23 DIAGNOSIS — Z1159 Encounter for screening for other viral diseases: Secondary | ICD-10-CM | POA: Diagnosis not present

## 2020-12-23 NOTE — Progress Notes (Signed)
Provider:Debbie Thomasene Lot NP  Location   [facility]  PCP: Sharee Holster, NP   Extended Emergency Contact Information Primary Emergency Contact: Atkins,Patty Address: [address]          [place], [place] [address] [place] of [place] Home Phone: [phone] Relation: Daughter Secondary Emergency Contact: GRIFFIN,TOMMY Mobile Phone: [phone] Relation: Son  Codes status: Full Code  Goals of care: advanced directive information Advanced Directives 12/23/2020  Does Patient Have a Medical Advance Directive? No  Type of Advance Directive -  Does patient want to make changes to medical advance directive? No - Patient declined  Copy of Healthcare Power of Attorney in Chart? -  Would patient like information on creating a medical advance directive? No - Patient declined     Allergies  Allergen Reactions  . Lisinopril   . Penicillins Nausea Only    Has patient had a PCN reaction causing immediate rash, facial/tongue/throat swelling, SOB or lightheadedness with hypotension: Yes Has patient had a PCN reaction causing severe rash involving mucus membranes or skin necrosis: No Has patient had a PCN reaction that required hospitalization No Has patient had a PCN reaction occurring within the last 10 years: No If all of the above answers are "NO", then may proceed with Cephalosporin use.   . Sulfa Antibiotics Nausea And Vomiting    Chief Complaint  Patient presents with  . Annual Exam    Annual h/p    HPI  She is a long term resident of this facility being seen for her annual exam. She has not been hospitalized over the past year. She has not been taken to the ED. There are no reports of recent falls. There are no reports of uncontrolled pain; no reports of changes in appetite; no reports of significant weight changes. No reports of agitation or anxiety.she has had 3 uti over the past year.  She continues to be followed for her chronic illnesses including:  Aortic atherosclerosis: . Non-insulin dependent type 2 diabetes mellitus:  Gastroesophagitis reflux disease without esophagitis:   Chronic non-seasonal allergic rhinitis: . Herpes:   Past Medical History:  Diagnosis Date  . Anxiety   . Bowel obstruction (HCC)   . Burning with urination 01/04/2016  . Diabetes mellitus without complication (HCC)   . Genital herpes   . GERD (gastroesophageal reflux disease)   . Hematuria 01/04/2016  . Herpes 12/22/2015  . Hyperlipidemia   . Hypertension   . LLQ abdominal tenderness 01/04/2016  . Neuropathy   . Pelvic pressure in female 01/04/2016  . Recurrent UTI   . Sciatic nerve disease   . Small bowel obstruction (HCC) 04/27/2016  . Urinary frequency 01/04/2016  . Urticaria    Past Surgical History:  Procedure Laterality Date  . ABDOMINAL HYSTERECTOMY    . APPLICATION OF INTRAOPERATIVE CT SCAN N/A 12/19/2018   Procedure: APPLICATION OF INTRAOPERATIVE CT SCAN;  Surgeon: Maeola Harman, MD;  Location: [facility] OR;  Service: Neurosurgery;  Laterality: N/A;  . bowel obstruction    . COLON SURGERY     blocked colon  . POSTERIOR CERVICAL FUSION/FORAMINOTOMY N/A 12/19/2018   Procedure: Cervical Seven to Thoracic One Posterior cervical fusion;  Surgeon: Maeola Harman, MD;  Location: [facility] OR;  Service: Neurosurgery;  Laterality: N/A;  Cervical Seven to Thoracic One Posterior cervical fusion    reports that she has never smoked. Her smokeless tobacco use includes snuff. She reports that she does not drink alcohol and does not use drugs. Social History  Tobacco Use  . Smoking status: Never Smoker  . Smokeless tobacco: Current User    Types: Snuff  Vaping Use  . Vaping Use: Never used  Substance Use Topics  . Alcohol use: No  . Drug use: No   Family History  Problem Relation Age of Onset  . Diabetes Mother   . Heart disease Brother   . Hypertension Brother   . Diabetes Brother   . Hypertension Daughter   . Diabetes Brother   . Diabetes Brother   .  Alzheimer's disease Brother   . Anxiety disorder Daughter     Pertinent  Health Maintenance Due  Topic Date Due  . PNA vac Low Risk Adult (1 of 2 - PCV13) Never done  . HEMOGLOBIN A1C  11/13/2020  . URINE MICROALBUMIN  04/02/2021  . FOOT EXAM  04/21/2021  . OPHTHALMOLOGY EXAM  07/15/2021  . INFLUENZA VACCINE  Completed  . DEXA SCAN  Completed   Fall Risk  03/06/2020 07/09/2019  Falls in the past year? 0 0  Number falls in past yr: - 0  Injury with Fall? - 0  Risk for fall due to : Impaired balance/gait;Impaired mobility History of fall(s);Impaired balance/gait;Impaired mobility;Mental status change  Follow up - Falls evaluation completed   Depression screen [facility] 2[facility] 03/06/2020  Decreased Interest 0  Down, Depressed, Hopeless 0  PHQ - 2 Score 0        Outpatient Encounter Medications as of 12/23/2020  Medication Sig  . acetaminophen (TYLENOL) 325 MG tablet Take 650 mg by mouth every 8 (eight) hours as needed. As needed for pain or HA. Max 3gm acetaminophen/24 hrs from all sources.  Marland Kitchen. acetaminophen (TYLENOL) 650 MG CR tablet Take 650 mg by mouth at bedtime.  Marland Kitchen. amLODipine (NORVASC) 10 MG tablet Take 1 tablet (10 mg total) by mouth daily.  Marland Kitchen. aspirin 81 MG chewable tablet Chew 81 mg by mouth daily.  Lucilla Lame. Balsam [place]-Castor Oil (VENELEX) OINT Apply topically. apply to residents buttocks q shift PRN Every Shift - PRN  . cyanocobalamin 1000 MCG tablet Take 1,000 mcg by mouth daily. Special Instructions: for vit B 12 deficiency  . DULoxetine (CYMBALTA) 20 MG capsule Take 40 mg by mouth daily.  . Emollient (EUCERIN) lotion Apply topically 4 (four) times daily as needed for dry skin.  Marland Kitchen. lamoTRIgine (LAMICTAL) 25 MG tablet Take 25 mg by mouth 2 (two) times daily.   Marland Kitchen. linagliptin (TRADJENTA) 5 MG TABS tablet Take 5 mg by mouth daily.  Marland Kitchen. loratadine (CLARITIN) 10 MG tablet Take 10 mg by mouth daily.  . Melatonin 5 MG TABS Take 5 mg by mouth at bedtime.  . Menthol, Topical Analgesic, 4 % GEL  Apply 1 application topically daily as needed (FOR PAIN).   Marland Kitchen. metFORMIN (GLUCOPHAGE) 1000 MG tablet Take 1,000 mg by mouth 2 (two) times daily with a meal.   . montelukast (SINGULAIR) 10 MG tablet Take 1 tablet (10 mg total) by mouth at bedtime.  . nitrofurantoin, macrocrystal-monohydrate, (MACROBID) 100 MG capsule Take 100 mg by mouth 2 (two) times daily.  . NON FORMULARY Diet:  Consistent Carbohydrate,  . pantoprazole (PROTONIX) 40 MG tablet Take 40 mg by mouth every morning.  . polyethylene glycol (MIRALAX / GLYCOLAX) packet Take 17 g by mouth daily.  Marland Kitchen. senna-docusate (SENOKOT-S) 8.6-50 MG tablet Take 1 tablet by mouth at bedtime.   . simvastatin (ZOCOR) 10 MG tablet Take 10 mg by mouth at bedtime.   . triamcinolone (NASACORT) 55 MCG/ACT AERO  nasal inhaler Place 1 spray into the nose daily.  . valACYclovir (VALTREX) 500 MG tablet Take 500 mg by mouth daily.   . [DISCONTINUED] gabapentin (NEURONTIN) 300 MG capsule Take 300 mg by mouth 2 (two) times daily. Reported on 02/03/2016   No facility-administered encounter medications on file as of 12/23/2020.     Vitals:   12/23/20 1053  BP: (!) 153/85  Pulse: 74  Temp: 98.1 F (36.7 C)  Weight: 183 lb (83 kg)  Height: 5' (1.524 m)   Body mass index is 35.74 kg/m.  DIAGNOSTIC EXAMS   PREVIOUS  12-18-19: chest x-ray: Mild right upper lobe and bibasilar airspace opacities which could reflect early pneumonia (including atypical/viral infection).  12-17-18; ct of abdomen and pelvis: Slight edema of the mucosa of the distal antrum of the stomach. This could represent gastritis. Otherwise, benign-appearing abdomen and pelvis.  07-16-20 DEXA: t score 0.825   NO NEW EXAMS.   LABS REVIEWED PREVIOUS;   12-16-19: urine culture: citrobacter youngae 12-18-19: wbc 16.6; hgb 14.7; hct 45.8; mcv 87.1 plt 330; glucose 168; bun 23; creat 0.85; k+ 3.7; na++ 136; ca 9.7; liver normal albumin 4.4 12-20-19: wbc 12.9; hgb 14.8; hct 48.2 ;mcv 90.4 plt 296 glucose  152; bun 33; creat 1.03 ;k+ 3.8; na++ 139; ca 9.7  01-02-20: wbc 14.5; hgb 12.9; hct 40.8 mcv 89.9 plt 171; glucose 128; bun 10; creat 0.80 ;k+ 4.3; na++ 138; ca 9.3 urine culture: multiple species.  02-24-20: chol 147; ldl 71; trig 120 hdl 52 hgb a1c 6.2 04-02-20; urine for micro-albumin <3.0  05-14-20: wbc 8.8; hgb 12.4; hct 39.7; mcv 88.4 plt 199; hgb a1c 6.8 vit B 12: 154  08-27-20: hepatitis C: neg 08-31-20: urine culture: multiple species 09-21-20: urine culture: e-coli: cipro  10-01-20: wbc 14.8; hgb 13.5; hct 43.3; mcv 87.7 plt 240; glucose 146; bun 19; creat 1.05; k+ 4.4; na++ 136; ca 9.7 vitamin B 12: 877; folate 13.9; tsh 4.543 11-16-20: wbc 11.7; hgb 12.7; hct 40.9; mcv 887 plt 213 urine culture: klebsiella pneumoniae: cipro  TODAY  12-19-20: urine culture: e-coli   Review of Systems  Constitutional: Negative for malaise/fatigue.  Respiratory: Negative for cough and shortness of breath.   Cardiovascular: Negative for chest pain, palpitations and leg swelling.  Gastrointestinal: Negative for abdominal pain, constipation and heartburn.  Genitourinary: Positive for dysuria and urgency.  Musculoskeletal: Negative for back pain, joint pain and myalgias.  Skin: Negative.   Neurological: Negative for dizziness.  Psychiatric/Behavioral: The patient is not nervous/anxious.       Physical Exam Constitutional:      General: She is not in acute distress.    Appearance: She is well-developed and well-nourished. She is not diaphoretic.  Neck:     Thyroid: No thyromegaly.  Cardiovascular:     Rate and Rhythm: Normal rate and regular rhythm.     Pulses: Normal pulses and intact distal pulses.     Heart sounds: Normal heart sounds.  Pulmonary:     Effort: Pulmonary effort is normal. No respiratory distress.     Breath sounds: Normal breath sounds.  Abdominal:     General: Bowel sounds are normal. There is no distension.     Palpations: Abdomen is soft.     Tenderness: There is no  abdominal tenderness.  Musculoskeletal:        General: No edema. Normal range of motion.     Cervical back: Neck supple.     Right lower leg: No edema.  Left lower leg: No edema.  Lymphadenopathy:     Cervical: No cervical adenopathy.  Skin:    General: Skin is warm and dry.  Neurological:     Mental Status: She is alert. Mental status is at baseline.  Psychiatric:        Mood and Affect: Mood and affect and mood normal.      ASSESSMENT/ PLAN:  TODAY  1. Aortic atherosclerosis: is stable will monitor  2. Non-insulin dependent type 2 diabetes mellitus: is stable hgb a1c 6.8 will continue metformin 1 gm twice daily tradjenta 5 mg daily   On ace and statin  3. 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 Chronic non-seasonal allergic rhinitis: is stable will continue claritin 10 mg daily and singular 10 mg daily  5. Herpes: is stable on recent outbreaks will continue valtrex 500 mg daily   6.  Vitamin B 12 deficiency: is stable level 877 will continue 1000 mcg daily   7. Chronic constipation: is stable will continue miralax daily and senna s daily   8. Major depression recurrent chronic: is stable will continue cymbalta 60 mg daily lamictal 25 mg twice daily for mood melatonin 5 mg daily   9. Hypertension associated with type 2 diabetes mellitus: is stable b/p 153/85 will continue norvasc 10 mg daily asa 81 mg daily   10. Dyslipidemia associated with type 2 diabetes mellitus: is stable LDL 71 will continue zocor 10 mg daily     MD is aware of resident's narcotic use and is in agreement with current plan of care. We will attempt to wean resident as appropriate.  Synthia Innocent NP [facility] Adult Medicine  Contact [phone] Monday through Friday 8am- 5pm  After hours call [phone]

## 2020-12-30 ENCOUNTER — Other Ambulatory Visit (HOSPITAL_COMMUNITY)
Admission: RE | Admit: 2020-12-30 | Discharge: 2020-12-30 | Disposition: A | Discharge status: Home / Self Care | Payer: Medicare Other | Source: Skilled Nursing Facility | Attending: Adult Health | Admitting: Adult Health

## 2020-12-30 DIAGNOSIS — Z1159 Encounter for screening for other viral diseases: Secondary | ICD-10-CM | POA: Diagnosis not present

## 2020-12-30 DIAGNOSIS — I700 Atherosclerosis of aorta: Secondary | ICD-10-CM | POA: Insufficient documentation

## 2020-12-30 DIAGNOSIS — F0390 Unspecified dementia without behavioral disturbance: Secondary | ICD-10-CM | POA: Diagnosis not present

## 2020-12-30 DIAGNOSIS — E119 Type 2 diabetes mellitus without complications: Secondary | ICD-10-CM | POA: Insufficient documentation

## 2020-12-30 LAB — HEMOGLOBIN A1C
Hgb A1c MFr Bld: 6.9 % — ABNORMAL HIGH (ref 4.8–5.6)
Mean Plasma Glucose: 151.33 mg/dL

## 2021-01-01 DIAGNOSIS — F0390 Unspecified dementia without behavioral disturbance: Secondary | ICD-10-CM | POA: Diagnosis not present

## 2021-01-01 DIAGNOSIS — Z1159 Encounter for screening for other viral diseases: Secondary | ICD-10-CM | POA: Diagnosis not present

## 2021-01-04 DIAGNOSIS — Z1159 Encounter for screening for other viral diseases: Secondary | ICD-10-CM | POA: Diagnosis not present

## 2021-01-04 DIAGNOSIS — F0390 Unspecified dementia without behavioral disturbance: Secondary | ICD-10-CM | POA: Diagnosis not present

## 2021-01-06 DIAGNOSIS — Z1159 Encounter for screening for other viral diseases: Secondary | ICD-10-CM | POA: Diagnosis not present

## 2021-01-06 DIAGNOSIS — F0390 Unspecified dementia without behavioral disturbance: Secondary | ICD-10-CM | POA: Diagnosis not present

## 2021-01-19 ENCOUNTER — Encounter: Payer: Self-pay | Admitting: Adult Health

## 2021-01-19 ENCOUNTER — Non-Acute Institutional Stay (SKILLED_NURSING_FACILITY): Payer: Medicare Other | Admitting: Adult Health

## 2021-01-19 DIAGNOSIS — J3089 Other allergic rhinitis: Secondary | ICD-10-CM

## 2021-01-19 DIAGNOSIS — E538 Deficiency of other specified B group vitamins: Secondary | ICD-10-CM

## 2021-01-19 DIAGNOSIS — B009 Herpesviral infection, unspecified: Secondary | ICD-10-CM

## 2021-01-19 NOTE — Progress Notes (Signed)
Location:  [facility] Room Number: 1115-W Place of Service:  SNF (31)   CODE STATUS: Full Code  Allergies  Allergen Reactions  . Lisinopril   . Penicillins Nausea Only    Has patient had a PCN reaction causing immediate rash, facial/tongue/throat swelling, SOB or lightheadedness with hypotension: Yes Has patient had a PCN reaction causing severe rash involving mucus membranes or skin necrosis: No Has patient had a PCN reaction that required hospitalization No Has patient had a PCN reaction occurring within the last 10 years: No If all of the above answers are "NO", then may proceed with Cephalosporin use.   . Sulfa Antibiotics Nausea And Vomiting    Chief Complaint  Patient presents with  . Medical Management of Chronic Issues              Chronic non-seasonal allergic rhinitis:  Herpes:   Vitamin B12: deficiency    HPI:  She is a 80 year old long term resident of this facility being seen for the management of her chronic illnesses: Chronic non-seasonal allergic rhinitis:  Herpes:   Vitamin B12: deficiency. There are no reports of uncontrolled pain; no reports of anxiety; no reports of rashes.   Past Medical History:  Diagnosis Date  . Anxiety   . Bowel obstruction (HCC)   . Burning with urination 01/04/2016  . Diabetes mellitus without complication (HCC)   . Genital herpes   . GERD (gastroesophageal reflux disease)   . Hematuria 01/04/2016  . Herpes 12/22/2015  . Hyperlipidemia   . Hypertension   . LLQ abdominal tenderness 01/04/2016  . Neuropathy   . Pelvic pressure in female 01/04/2016  . Recurrent UTI   . Sciatic nerve disease   . Small bowel obstruction (HCC) 04/27/2016  . Urinary frequency 01/04/2016  . Urticaria     Past Surgical History:  Procedure Laterality Date  . ABDOMINAL HYSTERECTOMY    . APPLICATION OF INTRAOPERATIVE CT SCAN N/A 12/19/2018   Procedure: APPLICATION OF INTRAOPERATIVE CT SCAN;  Surgeon: Maeola Harman, MD;  Location:  [facility] OR;  Service: Neurosurgery;  Laterality: N/A;  . bowel obstruction    . COLON SURGERY     blocked colon  . POSTERIOR CERVICAL FUSION/FORAMINOTOMY N/A 12/19/2018   Procedure: Cervical Seven to Thoracic One Posterior cervical fusion;  Surgeon: Maeola Harman, MD;  Location: [facility] OR;  Service: Neurosurgery;  Laterality: N/A;  Cervical Seven to Thoracic One Posterior cervical fusion    Social History   Socioeconomic History  . Marital status: Single    Spouse name: Not on file  . Number of children: Not on file  . Years of education: Not on file  . Highest education level: Not on file  Occupational History  . Not on file  Tobacco Use  . Smoking status: Never Smoker  . Smokeless tobacco: Current User    Types: Snuff  Vaping Use  . Vaping Use: Never used  Substance and Sexual Activity  . Alcohol use: No  . Drug use: No  . Sexual activity: Not Currently    Birth control/protection: Surgical    Comment: hyst  Other Topics Concern  . Not on file  Social History Narrative  . Not on file   Social Determinants of Health   Financial Resource Strain: Not on file  Food Insecurity: Not on file  Transportation Needs: Not on file  Physical Activity: Not on file  Stress: Not on file  Social Connections: Not on file  Intimate Partner Violence:  Not on file   Family History  Problem Relation Age of Onset  . Diabetes Mother   . Heart disease Brother   . Hypertension Brother   . Diabetes Brother   . Hypertension Daughter   . Diabetes Brother   . Diabetes Brother   . Alzheimer's disease Brother   . Anxiety disorder Daughter       VITAL SIGNS BP (!) 141/69   Pulse 67   Temp 98 F (36.7 C)   Resp 18   Ht 5' (1.524 m)   Wt 184 lb 9.6 oz (83.7 kg)   SpO2 98%   BMI 36.05 kg/m   Outpatient Encounter Medications as of 01/19/2021  Medication Sig  . acetaminophen (TYLENOL) 325 MG tablet Take 650 mg by mouth every 8 (eight) hours as needed. As needed for pain or HA. Max 3gm  acetaminophen/24 hrs from all sources.  Marland Kitchen acetaminophen (TYLENOL) 650 MG CR tablet Take 650 mg by mouth at bedtime.  Marland Kitchen amLODipine (NORVASC) 10 MG tablet Take 1 tablet (10 mg total) by mouth daily.  Marland Kitchen aspirin 81 MG chewable tablet Chew 81 mg by mouth daily.  Lucilla Lame [place]-Castor Oil (VENELEX) OINT Apply topically. apply to residents buttocks q shift PRN Every Shift - PRN  . cyanocobalamin 1000 MCG tablet Take 1,000 mcg by mouth daily. Special Instructions: for vit B 12 deficiency  . DULoxetine (CYMBALTA) 20 MG capsule Take 40 mg by mouth daily.  . Emollient (EUCERIN) lotion Apply topically 4 (four) times daily as needed for dry skin.  Marland Kitchen lamoTRIgine (LAMICTAL) 25 MG tablet Take 25 mg by mouth 2 (two) times daily.   Marland Kitchen linagliptin (TRADJENTA) 5 MG TABS tablet Take 5 mg by mouth daily.  Marland Kitchen loratadine (CLARITIN) 10 MG tablet Take 10 mg by mouth daily.  . Melatonin 5 MG TABS Take 5 mg by mouth at bedtime.  . Menthol, Topical Analgesic, 4 % GEL Apply 1 application topically daily as needed (FOR PAIN).   Marland Kitchen metFORMIN (GLUCOPHAGE) 1000 MG tablet Take 1,000 mg by mouth 2 (two) times daily with a meal.   . montelukast (SINGULAIR) 10 MG tablet Take 1 tablet (10 mg total) by mouth at bedtime.  . NON FORMULARY Diet:  Consistent Carbohydrate,  . pantoprazole (PROTONIX) 40 MG tablet Take 40 mg by mouth every morning.  . polyethylene glycol (MIRALAX / GLYCOLAX) packet Take 17 g by mouth daily.  Marland Kitchen senna-docusate (SENOKOT-S) 8.6-50 MG tablet Take 1 tablet by mouth at bedtime.   . simvastatin (ZOCOR) 10 MG tablet Take 10 mg by mouth at bedtime.   . triamcinolone (NASACORT) 55 MCG/ACT AERO nasal inhaler Place 1 spray into the nose daily.  . valACYclovir (VALTREX) 500 MG tablet Take 500 mg by mouth daily.   . [DISCONTINUED] gabapentin (NEURONTIN) 300 MG capsule Take 300 mg by mouth 2 (two) times daily. Reported on 02/03/2016   No facility-administered encounter medications on file as of 01/19/2021.     SIGNIFICANT  DIAGNOSTIC EXAMS   PREVIOUS  12-18-19: chest x-ray: Mild right upper lobe and bibasilar airspace opacities which could reflect early pneumonia (including atypical/viral infection).  12-17-18; ct of abdomen and pelvis: Slight edema of the mucosa of the distal antrum of the stomach. This could represent gastritis. Otherwise, benign-appearing abdomen and pelvis.  07-16-20 DEXA: t score 0.825   NO NEW EXAMS.   LABS REVIEWED PREVIOUS;   02-24-20: chol 147; ldl 71; trig 120 hdl 52 hgb a1c 6.2 04-02-20; urine for micro-albumin <3.0  05-14-20:  wbc 8.8; hgb 12.4; hct 39.7; mcv 88.4 plt 199; hgb a1c 6.8 vit B 12: 154  08-27-20: hepatitis C: neg 08-31-20: urine culture: multiple species 09-21-20: urine culture: e-coli: cipro  10-01-20: wbc 14.8; hgb 13.5; hct 43.3; mcv 87.7 plt 240; glucose 146; bun 19; creat 1.05; k+ 4.4; na++ 136; ca 9.7 vitamin B 12: 877; folate 13.9; tsh 4.543 11-16-20: wbc 11.7; hgb 12.7; hct 40.9; mcv 887 plt 213 urine culture: klebsiella pneumoniae: cipro 12-19-20: urine culture: e-coli  TODAY  12-30-20: hgb a1c 6.9   Review of Systems  Constitutional: Negative for malaise/fatigue.  Respiratory: Negative for cough and shortness of breath.   Cardiovascular: Negative for chest pain, palpitations and leg swelling.  Gastrointestinal: Negative for abdominal pain, constipation and heartburn.  Musculoskeletal: Negative for back pain, joint pain and myalgias.  Skin: Negative.   Neurological: Negative for dizziness.  Psychiatric/Behavioral: The patient is not nervous/anxious.      Physical Exam Constitutional:      General: She is not in acute distress.    Appearance: She is well-developed and well-nourished. She is not diaphoretic.  Neck:     Thyroid: No thyromegaly.  Cardiovascular:     Rate and Rhythm: Normal rate and regular rhythm.     Pulses: Normal pulses and intact distal pulses.     Heart sounds: Normal heart sounds.  Pulmonary:     Effort: Pulmonary effort is normal.  No respiratory distress.     Breath sounds: Normal breath sounds.  Abdominal:     General: Bowel sounds are normal. There is no distension.     Palpations: Abdomen is soft.     Tenderness: There is no abdominal tenderness.  Musculoskeletal:        General: No edema. Normal range of motion.     Cervical back: Neck supple.     Right lower leg: No edema.     Left lower leg: No edema.  Lymphadenopathy:     Cervical: No cervical adenopathy.  Skin:    General: Skin is warm and dry.  Neurological:     Mental Status: She is alert. Mental status is at baseline.  Psychiatric:        Mood and Affect: Mood and affect and mood normal.     ASSESSMENT/ PLAN:  TODAY  1. Chronic non-seasonal allergic rhinitis: is stable will continue claritin 10 mg daily and singulair 10 mg daily   2. Herpes: is stable no recent outbreaks will continue valtrex 500 mg daily   3. Vitamin B12: deficiency is stable level 877 will continue 1000 mcg daily   PREVIOUS   4. Chronic constipation: is stable will continue miralax daily and senna s daily   5. Major depression recurrent chronic: is stable will continue cymbalta 60 mg daily lamictal 25 mg twice daily for mood melatonin 5 mg daily   6. Hypertension associated with type 2 diabetes mellitus: is stable b/p 153/85 will continue norvasc 10 mg daily asa 81 mg daily   7. Dyslipidemia associated with type 2 diabetes mellitus: is stable LDL 71 will continue zocor 10 mg daily   8. Aortic atherosclerosis: is stable will monitor  9. Non-insulin dependent type 2 diabetes mellitus: is stable hgb a1c 6.9 will continue metformin 1 gm twice daily tradjenta 5 mg daily   On ace and statin  10. 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 We will attempt to wean resident as appropriate.  Synthia Innocent NP [facility] Adult  Medicine  Contact [phone] Monday through Friday 8am- 5pm  After hours call [phone]

## 2021-01-25 ENCOUNTER — Non-Acute Institutional Stay (SKILLED_NURSING_FACILITY): Payer: Medicare Other | Admitting: Adult Health

## 2021-01-25 DIAGNOSIS — G8929 Other chronic pain: Secondary | ICD-10-CM

## 2021-01-25 DIAGNOSIS — R52 Pain, unspecified: Secondary | ICD-10-CM | POA: Diagnosis not present

## 2021-01-25 NOTE — Progress Notes (Signed)
Location:  [facility] Room Number: 115/W Place of Service:  SNF (31)   CODE STATUS: full code   Allergies  Allergen Reactions  . Lisinopril   . Penicillins Nausea Only    Has patient had a PCN reaction causing immediate rash, facial/tongue/throat swelling, SOB or lightheadedness with hypotension: Yes Has patient had a PCN reaction causing severe rash involving mucus membranes or skin necrosis: No Has patient had a PCN reaction that required hospitalization No Has patient had a PCN reaction occurring within the last 10 years: No If all of the above answers are "NO", then may proceed with Cephalosporin use.   . Sulfa Antibiotics Nausea And Vomiting    Chief Complaint  Patient presents with  . Acute Visit    Left Knee Pain    HPI:  She has chronic generalized pain which is being treated with routine tylenol and cymbalta. She is complaining of bilateral knee pain left >right. States that the pain is achy in nature. She states that the pain is constant. She is willing to try medication to help with her pain.   Past Medical History:  Diagnosis Date  . Anxiety   . Bowel obstruction (HCC)   . Burning with urination 01/04/2016  . Diabetes mellitus without complication (HCC)   . Genital herpes   . GERD (gastroesophageal reflux disease)   . Hematuria 01/04/2016  . Herpes 12/22/2015  . Hyperlipidemia   . Hypertension   . LLQ abdominal tenderness 01/04/2016  . Neuropathy   . Pelvic pressure in female 01/04/2016  . Recurrent UTI   . Sciatic nerve disease   . Small bowel obstruction (HCC) 04/27/2016  . Urinary frequency 01/04/2016  . Urticaria     Past Surgical History:  Procedure Laterality Date  . ABDOMINAL HYSTERECTOMY    . APPLICATION OF INTRAOPERATIVE CT SCAN N/A 12/19/2018   Procedure: APPLICATION OF INTRAOPERATIVE CT SCAN;  Surgeon: Maeola Harman, MD;  Location: [facility] OR;  Service: Neurosurgery;  Laterality: N/A;  . bowel obstruction    . COLON SURGERY      blocked colon  . POSTERIOR CERVICAL FUSION/FORAMINOTOMY N/A 12/19/2018   Procedure: Cervical Seven to Thoracic One Posterior cervical fusion;  Surgeon: Maeola Harman, MD;  Location: [facility] OR;  Service: Neurosurgery;  Laterality: N/A;  Cervical Seven to Thoracic One Posterior cervical fusion    Social History   Socioeconomic History  . Marital status: Single    Spouse name: Not on file  . Number of children: Not on file  . Years of education: Not on file  . Highest education level: Not on file  Occupational History  . Not on file  Tobacco Use  . Smoking status: Never Smoker  . Smokeless tobacco: Current User    Types: Snuff  Vaping Use  . Vaping Use: Never used  Substance and Sexual Activity  . Alcohol use: No  . Drug use: No  . Sexual activity: Not Currently    Birth control/protection: Surgical    Comment: hyst  Other Topics Concern  . Not on file  Social History Narrative  . Not on file   Social Determinants of Health   Financial Resource Strain: Not on file  Food Insecurity: Not on file  Transportation Needs: Not on file  Physical Activity: Not on file  Stress: Not on file  Social Connections: Not on file  Intimate Partner Violence: Not on file   Family History  Problem Relation Age of Onset  . Diabetes Mother   .  Heart disease Brother   . Hypertension Brother   . Diabetes Brother   . Hypertension Daughter   . Diabetes Brother   . Diabetes Brother   . Alzheimer's disease Brother   . Anxiety disorder Daughter       VITAL SIGNS BP (!) 150/83   Pulse 80   Temp 98.3 F (36.8 C)   Ht 5' (1.524 m)   Wt 184 lb 9.6 oz (83.7 kg)   BMI 36.05 kg/m   Outpatient Encounter Medications as of 01/25/2021  Medication Sig  . acetaminophen (TYLENOL) 650 MG CR tablet Take 650 mg by mouth every 6 (six) hours.  Marland Kitchen amLODipine (NORVASC) 10 MG tablet Take 1 tablet (10 mg total) by mouth daily.  Marland Kitchen aspirin 81 MG chewable tablet Chew 81 mg by mouth daily.  Lucilla Lame  [place]-Castor Oil (VENELEX) OINT Apply topically. apply to residents buttocks q shift PRN Every Shift - PRN  . cyanocobalamin 1000 MCG tablet Take 1,000 mcg by mouth daily. Special Instructions: for vit B 12 deficiency  . DULoxetine (CYMBALTA) 20 MG capsule Take 40 mg by mouth daily.  . Emollient (EUCERIN) lotion Apply topically 4 (four) times daily as needed for dry skin.  Marland Kitchen lamoTRIgine (LAMICTAL) 25 MG tablet Take 25 mg by mouth 2 (two) times daily.   Marland Kitchen linagliptin (TRADJENTA) 5 MG TABS tablet Take 5 mg by mouth daily.  Marland Kitchen loratadine (CLARITIN) 10 MG tablet Take 10 mg by mouth daily.  . Melatonin 5 MG TABS Take 5 mg by mouth at bedtime.  . Menthol, Topical Analgesic, 4 % GEL Apply 1 application topically daily as needed (FOR PAIN).   Marland Kitchen metFORMIN (GLUCOPHAGE) 1000 MG tablet Take 1,000 mg by mouth 2 (two) times daily with a meal.   . montelukast (SINGULAIR) 10 MG tablet Take 1 tablet (10 mg total) by mouth at bedtime.  . NON FORMULARY Diet:  Consistent Carbohydrate,  . pantoprazole (PROTONIX) 40 MG tablet Take 40 mg by mouth every morning.  . polyethylene glycol (MIRALAX / GLYCOLAX) packet Take 17 g by mouth daily.  Marland Kitchen senna-docusate (SENOKOT-S) 8.6-50 MG tablet Take 1 tablet by mouth at bedtime.   . simvastatin (ZOCOR) 10 MG tablet Take 10 mg by mouth at bedtime.   . triamcinolone (NASACORT) 55 MCG/ACT AERO nasal inhaler Place 1 spray into the nose daily.  . valACYclovir (VALTREX) 500 MG tablet Take 500 mg by mouth daily.   . [DISCONTINUED] acetaminophen (TYLENOL) 325 MG tablet Take 650 mg by mouth every 8 (eight) hours as needed. As needed for pain or HA. Max 3gm acetaminophen/24 hrs from all sources.  . [DISCONTINUED] gabapentin (NEURONTIN) 300 MG capsule Take 300 mg by mouth 2 (two) times daily. Reported on 02/03/2016   No facility-administered encounter medications on file as of 01/25/2021.     SIGNIFICANT DIAGNOSTIC EXAMS   PREVIOUS  12-18-19: chest x-ray: Mild right upper lobe and  bibasilar airspace opacities which could reflect early pneumonia (including atypical/viral infection).  12-17-18; ct of abdomen and pelvis: Slight edema of the mucosa of the distal antrum of the stomach. This could represent gastritis. Otherwise, benign-appearing abdomen and pelvis.  07-16-20 DEXA: t score 0.825   NO NEW EXAMS.   LABS REVIEWED PREVIOUS;   02-24-20: chol 147; ldl 71; trig 120 hdl 52 hgb a1c 6.2 04-02-20; urine for micro-albumin <3.0  05-14-20: wbc 8.8; hgb 12.4; hct 39.7; mcv 88.4 plt 199; hgb a1c 6.8 vit B 12: 154  08-27-20: hepatitis C: neg 08-31-20: urine culture:  multiple species 09-21-20: urine culture: e-coli: cipro  10-01-20: wbc 14.8; hgb 13.5; hct 43.3; mcv 87.7 plt 240; glucose 146; bun 19; creat 1.05; k+ 4.4; na++ 136; ca 9.7 vitamin B 12: 877; folate 13.9; tsh 4.543 11-16-20: wbc 11.7; hgb 12.7; hct 40.9; mcv 887 plt 213 urine culture: klebsiella pneumoniae: cipro 12-19-20: urine culture: e-coli 12-30-20: hgb a1c 6.9   NO NEW LABS.   Review of Systems  Constitutional: Negative for malaise/fatigue.  Respiratory: Negative for cough and shortness of breath.   Cardiovascular: Negative for chest pain, palpitations and leg swelling.  Gastrointestinal: Negative for abdominal pain, constipation and heartburn.  Musculoskeletal: Positive for joint pain. Negative for back pain and myalgias.       Knees left >right   Skin: Negative.   Neurological: Negative for dizziness.  Psychiatric/Behavioral: The patient is not nervous/anxious.     Physical Exam Constitutional:      General: She is not in acute distress.    Appearance: She is well-developed and well-nourished. She is not diaphoretic.  Neck:     Thyroid: No thyromegaly.  Cardiovascular:     Rate and Rhythm: Normal rate and regular rhythm.     Pulses: Normal pulses and intact distal pulses.     Heart sounds: Normal heart sounds.  Pulmonary:     Effort: Pulmonary effort is normal. No respiratory distress.     Breath  sounds: Normal breath sounds.  Abdominal:     General: Bowel sounds are normal. There is no distension.     Palpations: Abdomen is soft.     Tenderness: There is no abdominal tenderness.  Musculoskeletal:        General: No edema. Normal range of motion.     Cervical back: Neck supple.     Right lower leg: No edema.     Left lower leg: No edema.  Lymphadenopathy:     Cervical: No cervical adenopathy.  Skin:    General: Skin is warm and dry.  Neurological:     Mental Status: She is alert. Mental status is at baseline.  Psychiatric:        Mood and Affect: Mood and affect and mood normal.        ASSESSMENT/ PLAN:  TODAY  1. Chronic generalized pain:  Is not being managed with knee pain; Will begin voltaren gel to bilateral knees 2 gm twice daily   MD is aware of resident's narcotic use and is in agreement with current plan of care. We will attempt to wean resident as appropriate.  Synthia Innocent NP [facility]  Contact [phone] Monday through Friday 8am- 5pm  After hours call [phone]

## 2021-02-08 ENCOUNTER — Non-Acute Institutional Stay (SKILLED_NURSING_FACILITY): Payer: Medicare Other | Admitting: Adult Health

## 2021-02-08 ENCOUNTER — Encounter: Payer: Self-pay | Admitting: Adult Health

## 2021-02-08 DIAGNOSIS — E1151 Type 2 diabetes mellitus with diabetic peripheral angiopathy without gangrene: Secondary | ICD-10-CM | POA: Diagnosis not present

## 2021-02-08 NOTE — Progress Notes (Signed)
Location:  [facility] Room Number: 222 Place of Service:  SNF (31)   CODE STATUS: full  Allergies  Allergen Reactions  . Lisinopril   . Penicillins Nausea Only    Has patient had a PCN reaction causing immediate rash, facial/tongue/throat swelling, SOB or lightheadedness with hypotension: Yes Has patient had a PCN reaction causing severe rash involving mucus membranes or skin necrosis: No Has patient had a PCN reaction that required hospitalization No Has patient had a PCN reaction occurring within the last 10 years: No If all of the above answers are "NO", then may proceed with Cephalosporin use.   . Sulfa Antibiotics Nausea And Vomiting   Chief Complaint  Patient presents with  . Acute Visit    Bilateral lower extremity pain      HPI:  She does have chronic pain. She is presently taking tylenol cr every 6 hours and cymbalta 40 mg daily . She feels as though she is not getting adequate relief with her current regimen. Her skin in intact.   Past Medical History:  Diagnosis Date  . Anxiety   . Bowel obstruction (HCC)   . Burning with urination 01/04/2016  . Diabetes mellitus without complication (HCC)   . Genital herpes   . GERD (gastroesophageal reflux disease)   . Hematuria 01/04/2016  . Herpes 12/22/2015  . Hyperlipidemia   . Hypertension   . LLQ abdominal tenderness 01/04/2016  . Neuropathy   . Pelvic pressure in female 01/04/2016  . Recurrent UTI   . Sciatic nerve disease   . Small bowel obstruction (HCC) 04/27/2016  . Urinary frequency 01/04/2016  . Urticaria     Past Surgical History:  Procedure Laterality Date  . ABDOMINAL HYSTERECTOMY    . APPLICATION OF INTRAOPERATIVE CT SCAN N/A 12/19/2018   Procedure: APPLICATION OF INTRAOPERATIVE CT SCAN;  Surgeon: Maeola Harman, MD;  Location: [facility] OR;  Service: Neurosurgery;  Laterality: N/A;  . bowel obstruction    . COLON SURGERY     blocked colon  . POSTERIOR CERVICAL FUSION/FORAMINOTOMY N/A  12/19/2018   Procedure: Cervical Seven to Thoracic One Posterior cervical fusion;  Surgeon: Maeola Harman, MD;  Location: [facility] OR;  Service: Neurosurgery;  Laterality: N/A;  Cervical Seven to Thoracic One Posterior cervical fusion    Social History   Socioeconomic History  . Marital status: Single    Spouse name: Not on file  . Number of children: Not on file  . Years of education: Not on file  . Highest education level: Not on file  Occupational History  . Not on file  Tobacco Use  . Smoking status: Never Smoker  . Smokeless tobacco: Current User    Types: Snuff  Vaping Use  . Vaping Use: Never used  Substance and Sexual Activity  . Alcohol use: No  . Drug use: No  . Sexual activity: Not Currently    Birth control/protection: Surgical    Comment: hyst  Other Topics Concern  . Not on file  Social History Narrative  . Not on file   Social Determinants of Health   Financial Resource Strain: Not on file  Food Insecurity: Not on file  Transportation Needs: Not on file  Physical Activity: Not on file  Stress: Not on file  Social Connections: Not on file  Intimate Partner Violence: Not on file   Family History  Problem Relation Age of Onset  . Diabetes Mother   . Heart disease Brother   . Hypertension Brother   .  Diabetes Brother   . Hypertension Daughter   . Diabetes Brother   . Diabetes Brother   . Alzheimer's disease Brother   . Anxiety disorder Daughter       VITAL SIGNS BP (!) 144/86   Pulse 80   Temp 97.9 F (36.6 C)   Ht 5' (1.524 m)   Wt 184 lb 9.6 oz (83.7 kg)   BMI 36.05 kg/m   Outpatient Encounter Medications as of 02/08/2021  Medication Sig  . acetaminophen (TYLENOL) 650 MG CR tablet Take 650 mg by mouth every 6 (six) hours.  Marland Kitchen amLODipine (NORVASC) 10 MG tablet Take 1 tablet (10 mg total) by mouth daily.  Marland Kitchen aspirin 81 MG chewable tablet Chew 81 mg by mouth daily.  Lucilla Lame [place]-Castor Oil (VENELEX) OINT Apply topically. apply to residents  buttocks q shift PRN Every Shift - PRN  . cyanocobalamin 1000 MCG tablet Take 1,000 mcg by mouth daily. Special Instructions: for vit B 12 deficiency  . DULoxetine (CYMBALTA) 20 MG capsule Take 40 mg by mouth daily.  . Emollient (EUCERIN) lotion Apply topically 4 (four) times daily as needed for dry skin.  Marland Kitchen lamoTRIgine (LAMICTAL) 25 MG tablet Take 25 mg by mouth 2 (two) times daily.   Marland Kitchen linagliptin (TRADJENTA) 5 MG TABS tablet Take 5 mg by mouth daily.  Marland Kitchen loratadine (CLARITIN) 10 MG tablet Take 10 mg by mouth daily.  . Melatonin 5 MG TABS Take 5 mg by mouth at bedtime.  . Menthol, Topical Analgesic, 4 % GEL Apply 1 application topically daily as needed (FOR PAIN).   Marland Kitchen metFORMIN (GLUCOPHAGE) 1000 MG tablet Take 1,000 mg by mouth 2 (two) times daily with a meal.   . montelukast (SINGULAIR) 10 MG tablet Take 1 tablet (10 mg total) by mouth at bedtime.  . NON FORMULARY Diet:  Consistent Carbohydrate,  . pantoprazole (PROTONIX) 40 MG tablet Take 40 mg by mouth every morning.  . polyethylene glycol (MIRALAX / GLYCOLAX) packet Take 17 g by mouth daily.  Marland Kitchen senna-docusate (SENOKOT-S) 8.6-50 MG tablet Take 1 tablet by mouth at bedtime.   . simvastatin (ZOCOR) 10 MG tablet Take 10 mg by mouth at bedtime.   . triamcinolone (NASACORT) 55 MCG/ACT AERO nasal inhaler Place 1 spray into the nose daily.  . valACYclovir (VALTREX) 500 MG tablet Take 500 mg by mouth daily.   . [DISCONTINUED] gabapentin (NEURONTIN) 300 MG capsule Take 300 mg by mouth 2 (two) times daily. Reported on 02/03/2016   No facility-administered encounter medications on file as of 02/08/2021.     SIGNIFICANT DIAGNOSTIC EXAMS   PREVIOUS  12-18-19: chest x-ray: Mild right upper lobe and bibasilar airspace opacities which could reflect early pneumonia (including atypical/viral infection).  12-17-18; ct of abdomen and pelvis: Slight edema of the mucosa of the distal antrum of the stomach. This could represent gastritis. Otherwise,  benign-appearing abdomen and pelvis.  07-16-20 DEXA: t score 0.825   NO NEW EXAMS.   LABS REVIEWED PREVIOUS;   02-24-20: chol 147; ldl 71; trig 120 hdl 52 hgb a1c 6.2 04-02-20; urine for micro-albumin <3.0  05-14-20: wbc 8.8; hgb 12.4; hct 39.7; mcv 88.4 plt 199; hgb a1c 6.8 vit B 12: 154  08-27-20: hepatitis C: neg 08-31-20: urine culture: multiple species 09-21-20: urine culture: e-coli: cipro  10-01-20: wbc 14.8; hgb 13.5; hct 43.3; mcv 87.7 plt 240; glucose 146; bun 19; creat 1.05; k+ 4.4; na++ 136; ca 9.7 vitamin B 12: 877; folate 13.9; tsh 4.543 11-16-20: wbc 11.7; hgb  12.7; hct 40.9; mcv 887 plt 213 urine culture: klebsiella pneumoniae: cipro 12-19-20: urine culture: e-coli 12-30-20: hgb a1c 6.9   NO NEW LABS.   Review of Systems  Constitutional: Negative for malaise/fatigue.  Respiratory: Negative for cough and shortness of breath.   Cardiovascular: Negative for chest pain, palpitations and leg swelling.  Gastrointestinal: Negative for abdominal pain, constipation and heartburn.  Musculoskeletal: Negative for back pain, joint pain and myalgias.  Skin: Negative.   Neurological: Positive for tingling. Negative for dizziness.       She has bilateral lower extremity numbness and tingling present   Psychiatric/Behavioral: The patient is not nervous/anxious.     Physical Exam Constitutional:      General: She is not in acute distress.    Appearance: She is well-developed and well-nourished. She is not diaphoretic.  Neck:     Thyroid: No thyromegaly.  Cardiovascular:     Rate and Rhythm: Normal rate and regular rhythm.     Pulses: Normal pulses and intact distal pulses.     Heart sounds: Normal heart sounds.  Pulmonary:     Effort: Pulmonary effort is normal. No respiratory distress.     Breath sounds: Normal breath sounds.  Abdominal:     General: Bowel sounds are normal. There is no distension.     Palpations: Abdomen is soft.     Tenderness: There is no abdominal tenderness.   Musculoskeletal:        General: No edema. Normal range of motion.     Cervical back: Neck supple.     Right lower leg: No edema.     Left lower leg: No edema.  Lymphadenopathy:     Cervical: No cervical adenopathy.  Skin:    General: Skin is warm and dry.  Neurological:     Mental Status: She is alert. Mental status is at baseline.  Psychiatric:        Mood and Affect: Mood and affect and mood normal.       ASSESSMENT/ PLAN:  TODAY  1. 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 If this is ineffective will consider gabapentin therapy.     MD is aware of resident's narcotic use and is in agreement with current plan of care. We will attempt to wean resident as appropriate.  Synthia Innocent NP [facility]  Contact [phone] Monday through Friday 8am- 5pm  After hours call [phone]

## 2021-02-10 DIAGNOSIS — E1151 Type 2 diabetes mellitus with diabetic peripheral angiopathy without gangrene: Secondary | ICD-10-CM | POA: Insufficient documentation

## 2021-02-11 ENCOUNTER — Non-Acute Institutional Stay (SKILLED_NURSING_FACILITY): Payer: Medicare Other | Admitting: Adult Health

## 2021-02-11 ENCOUNTER — Encounter: Payer: Self-pay | Admitting: Adult Health

## 2021-02-11 DIAGNOSIS — F339 Major depressive disorder, recurrent, unspecified: Secondary | ICD-10-CM

## 2021-02-11 DIAGNOSIS — I700 Atherosclerosis of aorta: Secondary | ICD-10-CM | POA: Diagnosis not present

## 2021-02-11 DIAGNOSIS — G9520 Unspecified cord compression: Secondary | ICD-10-CM

## 2021-02-11 NOTE — Progress Notes (Signed)
Location:  [facility] Room Number: 115/W Place of Service:  SNF (31)   CODE STATUS: Full Code  Allergies  Allergen Reactions  . Lisinopril   . Penicillins Nausea Only    Has patient had a PCN reaction causing immediate rash, facial/tongue/throat swelling, SOB or lightheadedness with hypotension: Yes Has patient had a PCN reaction causing severe rash involving mucus membranes or skin necrosis: No Has patient had a PCN reaction that required hospitalization No Has patient had a PCN reaction occurring within the last 10 years: No If all of the above answers are "NO", then may proceed with Cephalosporin use.   . Sulfa Antibiotics Nausea And Vomiting    Chief Complaint  Patient presents with  . Acute Visit    Care Plan Meeting     HPI:  We have come together for her care plan meeting.  BIMS 13/15; mood 6/30. 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 Feeds herself. She is occasionally incontinent of bladder is continent of bowel. There have been no falls. She will go out on LOA with family on occasions. Weight is 184.6 pounds  Aortic atherosclerosis Cord compression Major depression recurrent chronic   Past Medical History:  Diagnosis Date  . Anxiety   . Bowel obstruction (HCC)   . Burning with urination 01/04/2016  . Diabetes mellitus without complication (HCC)   . Genital herpes   . GERD (gastroesophageal reflux disease)   . Hematuria 01/04/2016  . Herpes 12/22/2015  . Hyperlipidemia   . Hypertension   . LLQ abdominal tenderness 01/04/2016  . Neuropathy   . Pelvic pressure in female 01/04/2016  . Recurrent UTI   . Sciatic nerve disease   . Small bowel obstruction (HCC) 04/27/2016  . Urinary frequency 01/04/2016  . Urticaria     Past Surgical History:  Procedure Laterality Date  . ABDOMINAL HYSTERECTOMY    . APPLICATION OF INTRAOPERATIVE CT SCAN N/A 12/19/2018   Procedure: APPLICATION OF INTRAOPERATIVE CT SCAN;  Surgeon: Maeola Harman, MD;   Location: [facility] OR;  Service: Neurosurgery;  Laterality: N/A;  . bowel obstruction    . COLON SURGERY     blocked colon  . POSTERIOR CERVICAL FUSION/FORAMINOTOMY N/A 12/19/2018   Procedure: Cervical Seven to Thoracic One Posterior cervical fusion;  Surgeon: Maeola Harman, MD;  Location: [facility] OR;  Service: Neurosurgery;  Laterality: N/A;  Cervical Seven to Thoracic One Posterior cervical fusion    Social History   Socioeconomic History  . Marital status: Single    Spouse name: Not on file  . Number of children: Not on file  . Years of education: Not on file  . Highest education level: Not on file  Occupational History  . Not on file  Tobacco Use  . Smoking status: Never Smoker  . Smokeless tobacco: Current User    Types: Snuff  Vaping Use  . Vaping Use: Never used  Substance and Sexual Activity  . Alcohol use: No  . Drug use: No  . Sexual activity: Not Currently    Birth control/protection: Surgical    Comment: hyst  Other Topics Concern  . Not on file  Social History Narrative  . Not on file   Social Determinants of Health   Financial Resource Strain: Not on file  Food Insecurity: Not on file  Transportation Needs: Not on file  Physical Activity: Not on file  Stress: Not on file  Social Connections: Not on file  Intimate Partner Violence: Not on file  Family History  Problem Relation Age of Onset  . Diabetes Mother   . Heart disease Brother   . Hypertension Brother   . Diabetes Brother   . Hypertension Daughter   . Diabetes Brother   . Diabetes Brother   . Alzheimer's disease Brother   . Anxiety disorder Daughter       VITAL SIGNS BP (!) 144/86   Pulse 70   Temp (!) 97.5 F (36.4 C)   Ht 5' (1.524 m)   Wt 184 lb 9.6 oz (83.7 kg)   BMI 36.05 kg/m   Outpatient Encounter Medications as of 02/11/2021  Medication Sig  . acetaminophen (TYLENOL) 650 MG CR tablet Take 650 mg by mouth every 6 (six) hours.  Marland Kitchen amLODipine (NORVASC) 10 MG tablet Take 1 tablet (10  mg total) by mouth daily.  Marland Kitchen aspirin 81 MG chewable tablet Chew 81 mg by mouth daily.  Lucilla Lame [place]-Castor Oil (VENELEX) OINT Apply topically. apply to residents buttocks q shift PRN Every Shift - PRN  . cyanocobalamin 1000 MCG tablet Take 1,000 mcg by mouth daily. Special Instructions: for vit B 12 deficiency  . diclofenac Sodium (VOLTAREN) 1 % GEL Apply 2 g topically 2 (two) times daily.  . DULoxetine (CYMBALTA) 20 MG capsule Take 20 mg by mouth 2 (two) times daily.  . Emollient (EUCERIN) lotion Apply topically 4 (four) times daily as needed for dry skin.  Marland Kitchen lamoTRIgine (LAMICTAL) 25 MG tablet Take 25 mg by mouth 2 (two) times daily.   Marland Kitchen linagliptin (TRADJENTA) 5 MG TABS tablet Take 5 mg by mouth daily.  Marland Kitchen loratadine (CLARITIN) 10 MG tablet Take 10 mg by mouth daily.  . Melatonin 5 MG TABS Take 5 mg by mouth at bedtime.  . Menthol, Topical Analgesic, 4 % GEL Apply 1 application topically daily as needed (FOR PAIN).   Marland Kitchen metFORMIN (GLUCOPHAGE) 1000 MG tablet Take 1,000 mg by mouth 2 (two) times daily with a meal.   . montelukast (SINGULAIR) 10 MG tablet Take 1 tablet (10 mg total) by mouth at bedtime.  . NON FORMULARY Diet:  Consistent Carbohydrate,  . pantoprazole (PROTONIX) 40 MG tablet Take 40 mg by mouth every morning.  . polyethylene glycol (MIRALAX / GLYCOLAX) packet Take 17 g by mouth daily.  Marland Kitchen senna-docusate (SENOKOT-S) 8.6-50 MG tablet Take 1 tablet by mouth at bedtime.   . simvastatin (ZOCOR) 10 MG tablet Take 10 mg by mouth at bedtime.   . triamcinolone (NASACORT) 55 MCG/ACT AERO nasal inhaler Place 1 spray into the nose daily.  . valACYclovir (VALTREX) 500 MG tablet Take 500 mg by mouth daily.   . [DISCONTINUED] gabapentin (NEURONTIN) 300 MG capsule Take 300 mg by mouth 2 (two) times daily. Reported on 02/03/2016   No facility-administered encounter medications on file as of 02/11/2021.     SIGNIFICANT DIAGNOSTIC EXAMS   PREVIOUS  07-16-20 DEXA: t score 0.825   NO NEW  EXAMS.   LABS REVIEWED PREVIOUS;   02-24-20: chol 147; ldl 71; trig 120 hdl 52 hgb a1c 6.2 04-02-20; urine for micro-albumin <3.0  05-14-20: wbc 8.8; hgb 12.4; hct 39.7; mcv 88.4 plt 199; hgb a1c 6.8 vit B 12: 154  08-27-20: hepatitis C: neg 08-31-20: urine culture: multiple species 09-21-20: urine culture: e-coli: cipro  10-01-20: wbc 14.8; hgb 13.5; hct 43.3; mcv 87.7 plt 240; glucose 146; bun 19; creat 1.05; k+ 4.4; na++ 136; ca 9.7 vitamin B 12: 877; folate 13.9; tsh 4.543 11-16-20: wbc 11.7; hgb 12.7; hct  40.9; mcv 887 plt 213 urine culture: klebsiella pneumoniae: cipro 12-19-20: urine culture: e-coli 12-30-20: hgb a1c 6.9   NO NEW LABS.   Review of Systems  Constitutional: Negative for malaise/fatigue.  Respiratory: Negative for cough and shortness of breath.   Cardiovascular: Negative for chest pain, palpitations and leg swelling.  Gastrointestinal: Negative for abdominal pain, constipation and heartburn.  Musculoskeletal: Negative for back pain, joint pain and myalgias.  Skin: Negative.   Neurological: Negative for dizziness.  Psychiatric/Behavioral: The patient is not nervous/anxious.    Physical Exam Constitutional:      General: She is not in acute distress.    Appearance: She is well-developed and well-nourished. She is not diaphoretic.  Neck:     Thyroid: No thyromegaly.  Cardiovascular:     Rate and Rhythm: Normal rate and regular rhythm.     Pulses: Normal pulses and intact distal pulses.     Heart sounds: Normal heart sounds.  Pulmonary:     Effort: Pulmonary effort is normal. No respiratory distress.     Breath sounds: Normal breath sounds.  Abdominal:     General: Bowel sounds are normal. There is no distension.     Palpations: Abdomen is soft.     Tenderness: There is no abdominal tenderness.  Musculoskeletal:        General: No edema. Normal range of motion.     Cervical back: Neck supple.     Right lower leg: No edema.     Left lower leg: No edema.   Lymphadenopathy:     Cervical: No cervical adenopathy.  Skin:    General: Skin is warm and dry.  Neurological:     Mental Status: She is alert. Mental status is at baseline.  Psychiatric:        Mood and Affect: Mood and affect and mood normal.       ASSESSMENT/ PLAN:  TODAY  1. Aortic atherosclerosis 2. Cord compression 3. Major depression recurrent chronic   Will continue current medications Will continue current plan of care Will continue to monitor her status.    Synthia Innocent NP [facility]  Contact [phone] Monday through Friday 8am- 5pm  After hours call [phone]

## 2021-02-25 ENCOUNTER — Non-Acute Institutional Stay (SKILLED_NURSING_FACILITY): Payer: Medicare Other | Admitting: Internal Medicine

## 2021-02-25 ENCOUNTER — Encounter: Payer: Self-pay | Admitting: Internal Medicine

## 2021-02-25 DIAGNOSIS — N1831 Chronic kidney disease, stage 3a: Secondary | ICD-10-CM | POA: Diagnosis not present

## 2021-02-25 DIAGNOSIS — E1151 Type 2 diabetes mellitus with diabetic peripheral angiopathy without gangrene: Secondary | ICD-10-CM

## 2021-02-25 DIAGNOSIS — N183 Chronic kidney disease, stage 3 unspecified: Secondary | ICD-10-CM | POA: Insufficient documentation

## 2021-02-25 DIAGNOSIS — G629 Polyneuropathy, unspecified: Secondary | ICD-10-CM | POA: Diagnosis not present

## 2021-02-25 DIAGNOSIS — R351 Nocturia: Secondary | ICD-10-CM | POA: Diagnosis not present

## 2021-02-25 DIAGNOSIS — E538 Deficiency of other specified B group vitamins: Secondary | ICD-10-CM | POA: Diagnosis not present

## 2021-02-25 NOTE — Assessment & Plan Note (Signed)
10/01/2020 creatinine 1.05/GFR 54.  Avoid nephrotoxic drugs and monitor renal function as clinically indicated.

## 2021-02-25 NOTE — Progress Notes (Signed)
NURSING HOME LOCATION:   [facility] ROOM NUMBER:  115 W  CODE STATUS:  Full Code  PCP: Synthia Innocent NP  This is a nursing facility follow up visit of chronic medical diagnoses & to document compliance with Regulation 483.30 (c) in The [employer] Survey Manual Phase 2 which mandates caregiver visit ( visits can alternate among physician, PA or NP as per statutes) within 10 days of 30 days / 60 days/ 90 days post admission to SNF date    Interim medical record and care since last SNF visit was updated with review of diagnostic studies and change in clinical status since last visit were documented.  HPI: She is a permanent resident of facility with history of small bowel obstruction, essential hypertension, dyslipidemia, GERD, aortic atherosclerosis, vitamin B12 deficiency, and diabetes with vascular complications. Surgical procedures include posterior cervical fusion and foraminotomy. Extensive family history is noncontributory due to her advanced age. She has never drunk alcohol or smoked.  Review of systems: When asked if she had any active symptoms she stated "problem down there" pointing to the suprapubic area.  She states for months she has had frequency during the day as well as nocturia 4-5 times per night.  She denies any other GU symptoms.  She does have chronic back pain aggravated by attempting to stand up but denies flank pain.  She also has chronic neuropathic pain with burning and stinging in her feet especially at night.  Constitutional: No fever, significant weight change, fatigue  Eyes: No redness, discharge, pain, vision change ENT/mouth: No nasal congestion,  purulent discharge, earache, change in hearing, sore throat  Cardiovascular: No chest pain, palpitations, paroxysmal nocturnal dyspnea, claudication, edema  Respiratory: No cough, sputum production, hemoptysis, DOE, significant snoring, apnea   Gastrointestinal: No heartburn, dysphagia,  abdominal pain, nausea /vomiting, rectal bleeding, melena, change in bowels Genitourinary: No dysuria, hematuria, pyuria, incontinence Musculoskeletal: No joint stiffness, joint swelling, weakness Dermatologic: No rash, pruritus, change in appearance of skin Neurologic: No dizziness, headache, syncope, seizures Psychiatric: No significant anxiety, depression, insomnia, anorexia Endocrine: No change in hair/skin/nails, excessive thirst, excessive hunger Hematologic/lymphatic: No significant bruising, lymphadenopathy, abnormal bleeding Allergy/immunology: No itchy/watery eyes, significant sneezing, urticaria, angioedema  Physical exam:  Pertinent or positive findings: Initially she was in the hall socializing with other residents.  She used her walker to go back into her room so I could interview and examine her. Gait is slow & broad. She is hard of hearing.  Maxilla is edentulous; she has 2 lower mandibular teeth.  She is not wearing the upper plate or lower partial.  She has an op scar over the upper thoracic posterior spine.  Heart rhythm is slightly irregular.  Abdomen is protuberant.  Pedal pulses are decreased.  There is dependent slight rubor of the feet.  General appearance: Adequately nourished; no acute distress, increased work of breathing is present.   Lymphatic: No lymphadenopathy about the head, neck, axilla. Eyes: No conjunctival inflammation or lid edema is present. There is no scleral icterus. Ears:  External ear exam shows no significant lesions or deformities.   Nose:  External nasal examination shows no deformity or inflammation. Nasal mucosa are pink and moist without lesions, exudates Oral exam:  Lips and gums are healthy appearing.  Neck:  No thyromegaly, masses, tenderness noted.    Heart:  No gallop, murmur, click, rub .  Lungs: Chest clear to auscultation without wheezes, rhonchi, rales, rubs. Abdomen: Bowel sounds are normal. Abdomen is  soft and nontender with no  organomegaly, hernias, masses. GU: Deferred  Extremities:  No cyanosis, clubbing, edema  Neurologic exam :Balance, Rhomberg, finger to nose testing could not be completed due to clinical state Skin: Warm & dry w/o tenting. No significant lesions or rash.  See summary under each active problem in the Problem List with associated updated therapeutic plan

## 2021-02-25 NOTE — Assessment & Plan Note (Addendum)
Pedal pulses decreased but no ischemic changes present. A1c 6.9 with a mean glucose of 151.  These values are at goal on present therapy.

## 2021-02-25 NOTE — Assessment & Plan Note (Signed)
Recent CBC revealed no anemia or macrocytosis.

## 2021-02-25 NOTE — Patient Instructions (Signed)
See assessment and plan under each diagnosis in the problem list and acutely for this visit 

## 2021-02-25 NOTE — Assessment & Plan Note (Signed)
Trial of gabapentin nightly will be discussed with [facility] NP

## 2021-02-26 ENCOUNTER — Encounter: Payer: Self-pay | Admitting: Internal Medicine

## 2021-02-26 ENCOUNTER — Encounter (HOSPITAL_COMMUNITY)
Admission: AD | Admit: 2021-02-26 | Discharge: 2021-02-26 | Disposition: A | Discharge status: Home / Self Care | Payer: Medicare Other | Source: Skilled Nursing Facility | Attending: Adult Health | Admitting: Adult Health

## 2021-02-26 DIAGNOSIS — R351 Nocturia: Secondary | ICD-10-CM | POA: Insufficient documentation

## 2021-02-26 LAB — URINALYSIS, ROUTINE W REFLEX MICROSCOPIC
Bilirubin Urine: NEGATIVE
Glucose, UA: NEGATIVE mg/dL
Hgb urine dipstick: NEGATIVE
Ketones, ur: NEGATIVE mg/dL
Leukocytes,Ua: NEGATIVE
Nitrite: NEGATIVE
Protein, ur: NEGATIVE mg/dL
Specific Gravity, Urine: 1.024 (ref 1.005–1.030)
pH: 5 (ref 5.0–8.0)

## 2021-03-01 LAB — URINE CULTURE: Culture: 80000 — AB

## 2021-03-08 ENCOUNTER — Encounter: Payer: Self-pay | Admitting: Adult Health

## 2021-03-08 ENCOUNTER — Non-Acute Institutional Stay (SKILLED_NURSING_FACILITY): Payer: Medicare Other | Admitting: Adult Health

## 2021-03-08 DIAGNOSIS — Z0000 Encounter for general adult medical examination without abnormal findings: Secondary | ICD-10-CM

## 2021-03-08 NOTE — Progress Notes (Signed)
Subjective:   [name] [name] is a 80 y.o. female who presents for Medicare Annual (Subsequent) preventive examination.  Review of Systems    Review of Systems  Constitutional: Negative for malaise/fatigue.  Respiratory: Negative for cough and shortness of breath.   Cardiovascular: Negative for chest pain, palpitations and leg swelling.  Gastrointestinal: Negative for abdominal pain, constipation and heartburn.  Musculoskeletal: Negative for back pain, joint pain and myalgias.  Skin: Negative.   Neurological: Negative for dizziness.  Psychiatric/Behavioral: The patient is not nervous/anxious.     Cardiac Risk Factors include: advanced age (>69men, >37 women);obesity (BMI >30kg/m2);diabetes mellitus;hypertension;dyslipidemia     Objective:    Today's Vitals   03/08/21 1151 03/08/21 1153  BP: 139/72   Pulse: 75   Resp: 18   Temp: (!) 97.5 F (36.4 C)   Weight: 184 lb 9.6 oz (83.7 kg)   Height: 5' (1.524 m)   PainSc:  0-No pain   Body mass index is 36.05 kg/m.  Advanced Directives 02/25/2021 02/11/2021 01/19/2021 12/23/2020 12/21/2020 11/26/2020 11/24/2020  Does Patient Have a Medical Advance Directive? No No No No No No No  Type of Advance Directive - - - - - - -  Does patient want to make changes to medical advance directive? No - Patient declined No - Patient declined No - Patient declined No - Patient declined No - Patient declined - No - Patient declined  Copy of Healthcare Power of Attorney in Chart? - - - - - - -  Would patient like information on creating a medical advance directive? - - - No - Patient declined No - Patient declined No - Patient declined -    Current Medications (verified) Outpatient Encounter Medications as of 03/08/2021  Medication Sig  . acetaminophen (TYLENOL) 650 MG CR tablet Take 650 mg by mouth every 6 (six) hours.  Marland Kitchen amLODipine (NORVASC) 10 MG tablet Take 1 tablet (10 mg total) by mouth daily.  Marland Kitchen aspirin 81 MG chewable tablet Chew 81 mg by  mouth daily.  Lucilla Lame [place]-Castor Oil (VENELEX) OINT Apply topically. apply to residents buttocks q shift PRN Every Shift - PRN  . cyanocobalamin 1000 MCG tablet Take 1,000 mcg by mouth daily. Special Instructions: for vit B 12 deficiency  . Dextromethorphan-guaiFENesin (ROBITUSSIN DM PO) Take 15 mLs by mouth every 6 (six) hours.  . diclofenac Sodium (VOLTAREN) 1 % GEL Apply 2 g topically 2 (two) times daily.  . DULoxetine (CYMBALTA) 20 MG capsule Take 20 mg by mouth 2 (two) times daily.  . Emollient (EUCERIN) lotion Apply topically 4 (four) times daily as needed for dry skin.  Marland Kitchen lamoTRIgine (LAMICTAL) 25 MG tablet Take 25 mg by mouth 2 (two) times daily.   Marland Kitchen linagliptin (TRADJENTA) 5 MG TABS tablet Take 5 mg by mouth daily.  Marland Kitchen loratadine (CLARITIN) 10 MG tablet Take 10 mg by mouth daily.  . Melatonin 5 MG TABS Take 5 mg by mouth at bedtime.  . Menthol, Topical Analgesic, 4 % GEL Apply 1 application topically daily as needed (FOR PAIN).   Marland Kitchen metFORMIN (GLUCOPHAGE) 1000 MG tablet Take 1,000 mg by mouth 2 (two) times daily with a meal.   . montelukast (SINGULAIR) 10 MG tablet Take 1 tablet (10 mg total) by mouth at bedtime.  . NON FORMULARY Diet:  Consistent Carbohydrate,  . pantoprazole (PROTONIX) 40 MG tablet Take 40 mg by mouth every morning.  . polyethylene glycol (MIRALAX / GLYCOLAX) packet Take 17 g by mouth daily.  Marland Kitchen  senna-docusate (SENOKOT-S) 8.6-50 MG tablet Take 1 tablet by mouth at bedtime.   . simvastatin (ZOCOR) 10 MG tablet Take 10 mg by mouth at bedtime.   . triamcinolone (NASACORT) 55 MCG/ACT AERO nasal inhaler Place 1 spray into the nose daily.  . valACYclovir (VALTREX) 500 MG tablet Take 500 mg by mouth daily.   . [DISCONTINUED] gabapentin (NEURONTIN) 300 MG capsule Take 300 mg by mouth 2 (two) times daily. Reported on 02/03/2016   No facility-administered encounter medications on file as of 03/08/2021.    Allergies (verified) Lisinopril, Penicillins, and Sulfa antibiotics    History: Past Medical History:  Diagnosis Date  . Anxiety   . Bowel obstruction (HCC)   . Burning with urination 01/04/2016  . Diabetes mellitus without complication (HCC)   . Genital herpes   . GERD (gastroesophageal reflux disease)   . Hematuria 01/04/2016  . Herpes 12/22/2015  . Hyperlipidemia   . Hypertension   . LLQ abdominal tenderness 01/04/2016  . Neuropathy   . Pelvic pressure in female 01/04/2016  . Recurrent UTI   . Sciatic nerve disease   . Small bowel obstruction (HCC) 04/27/2016  . Urinary frequency 01/04/2016  . Urticaria    Past Surgical History:  Procedure Laterality Date  . ABDOMINAL HYSTERECTOMY    . APPLICATION OF INTRAOPERATIVE CT SCAN N/A 12/19/2018   Procedure: APPLICATION OF INTRAOPERATIVE CT SCAN;  Surgeon: Maeola Harman, MD;  Location: [facility] OR;  Service: Neurosurgery;  Laterality: N/A;  . bowel obstruction    . COLON SURGERY     blocked colon  . POSTERIOR CERVICAL FUSION/FORAMINOTOMY N/A 12/19/2018   Procedure: Cervical Seven to Thoracic One Posterior cervical fusion;  Surgeon: Maeola Harman, MD;  Location: [facility] OR;  Service: Neurosurgery;  Laterality: N/A;  Cervical Seven to Thoracic One Posterior cervical fusion   Family History  Problem Relation Age of Onset  . Diabetes Mother   . Heart disease Brother   . Hypertension Brother   . Diabetes Brother   . Hypertension Daughter   . Diabetes Brother   . Diabetes Brother   . Alzheimer's disease Brother   . Anxiety disorder Daughter    Social History   Socioeconomic History  . Marital status: Widowed    Spouse name: Not on file  . Number of children: Not on file  . Years of education: Not on file  . Highest education level: Not on file  Occupational History  . Occupation: retired   Tobacco Use  . Smoking status: Never Smoker  . Smokeless tobacco: Current User    Types: Snuff  Vaping Use  . Vaping Use: Never used  Substance and Sexual Activity  . Alcohol use: No  . Drug use: No  . Sexual  activity: Not Currently    Birth control/protection: Surgical    Comment: hyst  Other Topics Concern  . Not on file  Social History Narrative   Long term resident of [facility]    Social Determinants of Health   Financial Resource Strain: Not on file  Food Insecurity: Not on file  Transportation Needs: Not on file  Physical Activity: Not on file  Stress: Not on file  Social Connections: Not on file    Tobacco Counseling Ready to quit: Not Answered Counseling given: Not Answered   Clinical Intake:  Pre-visit preparation completed: Yes  Pain : No/denies pain Pain Score: 0-No pain     BMI - recorded: 36.05 Nutritional Status: BMI > 30  Obese Nutritional Risks: None Diabetes: Yes CBG  done?: Yes CBG resulted in Enter/ Edit results?: Yes Did pt. bring in CBG monitor from home?: No  How often do you need to have someone help you when you read instructions, pamphlets, or other written materials from your doctor or pharmacy?: 5 - Always  Diabetic?yes   Interpreter Needed?: No      Activities of Daily Living In your present state of health, do you have any difficulty performing the following activities: 03/08/2021  Hearing? Y  Vision? N  Difficulty concentrating or making decisions? N  Walking or climbing stairs? N  Dressing or bathing? N  Doing errands, shopping? N  Preparing Food and eating ? Y  Using the Toilet? Y  In the past six months, have you accidently leaked urine? Y  Do you have problems with loss of bowel control? Y  Managing your Medications? Y  Managing your Finances? Y  Housekeeping or managing your Housekeeping? Y  Some recent data might be hidden    Patient Care Team: Sharee Holster, NP as PCP - [facility]  Indicate any recent Medical Services you may have received from other than [facility] providers in the past year (date may be approximate).     Assessment:   This is a routine  wellness examination for [place].  Hearing/Vision screen No exam data present  Dietary issues and exercise activities discussed: Current Exercise Habits: The patient does not participate in regular exercise at present, Exercise limited by: None identified  Goals    . Absence of Fall and Fall-Related Injury     Evidence-based guidance:   Assess fall risk using a validated tool when available. Consider balance and gait impairment, muscle weakness, diminished vision or hearing, environmental hazards, presence of urinary or bowel urgency and/or incontinence.   Communicate fall injury risk to interprofessional healthcare team.   Develop a fall prevention plan with the patient and family.   Promote use of personal vision and auditory aids.   Promote reorientation, appropriate sensory stimulation, and routines to decrease risk of fall when changes in mental status are present.   Assess assistance level required for safe and effective self-care; consider referral for home care.   Encourage physical activity, such as performance of self-care at highest level of ability, strength and balance exercise program, and provision of appropriate assistive devices; refer to rehabilitation therapy.   Refer to community-based fall prevention program where available.   If fall occurs, determine the cause and revise fall injury prevention plan.   Regularly review medication contribution to fall risk; consider risk related to polypharmacy and age.   Refer to pharmacist for consultation when concerns about medications are revealed.   Balance adequate pain management with potential for oversedation.   Provide guidance related to environmental modifications.   Consider supplementation with Vitamin D.   Notes:     . Follow up with Primary Care Provider    . General - Client will not be readmitted within 30 days ([facility])      Depression Screen PHQ 2/9 Scores 03/08/2021 03/06/2020  PHQ - 2 Score 0 0     Fall Risk Fall Risk  03/08/2021 03/06/2020 07/09/2019  Falls in the past year? 0 0 0  Number falls in past yr: 0 - 0  Injury with Fall? 0 - 0  Risk for fall due to : Impaired balance/gait;Impaired mobility Impaired balance/gait;Impaired mobility History of fall(s);Impaired balance/gait;Impaired mobility;Mental status change  Follow up Falls evaluation completed - Falls evaluation  completed    FALL RISK PREVENTION PERTAINING TO THE HOME:  Any stairs in or around the home? No  If so, are there any without handrails? N/a Home free of loose throw rugs in walkways, pet beds, electrical cords, etc? yes  Adequate lighting in your home to reduce risk of falls?yes  ASSISTIVE DEVICES UTILIZED TO PREVENT FALLS:  Life alert? No  Use of a cane, walker or w/c? Yes  Grab bars in the bathroom?  Yes  Shower chair or bench in shower? Yes  Elevated toilet seat or a handicapped toilet? Yes   TIMED UP AND GO:  Was the test performed? no  Nonambulatory   Cognitive Function:     6CIT Screen 03/06/2020  What Year? 0 points  What month? 0 points  What time? 3 points  Count back from 20 2 points  Months in reverse 2 points  Repeat phrase 2 points  Total Score 9    Immunizations Immunization History  Administered Date(s) Administered  . Influenza-Unspecified 09/16/2019, 09/18/2020  . Moderna SARS-COV2 Booster Vaccination 01/15/2021  . Moderna Sars-Covid-2 Vaccination 05/08/2020, 06/04/2020   Vaccines per facility   Screening Tests Health Maintenance  Topic Date Due  . TETANUS/TDAP  Never done  . PNA vac Low Risk Adult (1 of 2 - PCV13) Never done  . URINE MICROALBUMIN  04/02/2021  . FOOT EXAM  04/21/2021  . HEMOGLOBIN A1C  06/29/2021  . OPHTHALMOLOGY EXAM  07/15/2021  . INFLUENZA VACCINE  Completed  . DEXA SCAN  Completed  . COVID-19 Vaccine  Completed  . Hepatitis C Screening  Completed  . HPV VACCINES  Aged Out    Health Maintenance  Health Maintenance Due  Topic Date Due   . TETANUS/TDAP  Never done  . PNA vac Low Risk Adult (1 of 2 - PCV13) Never done  . URINE MICROALBUMIN  04/02/2021   Per facility   Lung Cancer Screening: (Low Dose CT Chest recommended if Age 93-80 years, 30 pack-year currently smoking OR have quit w/in 15years.) does not  qualify.   Lung Cancer Screening Referral: no   Additional Screening:  Hepatitis C Screening: does not quality   Vision Screening: Recommended annual ophthalmology exams for early detection of glaucoma and other disorders of the eye. Is the patient up to date with their annual eye exam?yes Who is the provider or what is the name of the office in which the patient attends annual eye exams? Per facility  If pt is not established with a provider, would they like to be referred to a provider to establish care? N/a   Dental Screening: Recommended annual dental exams for proper oral hygiene  Community Resource Referral / Chronic Care Management: CRR required this visit? no CCM required this visit? No      Plan:     I have personally reviewed and noted the following in the patient's chart:   . Medical and social history . Use of alcohol, tobacco or illicit drugs  . Current medications and supplements . Functional ability and status . Nutritional status . Physical activity . Advanced directives . List of other physicians . Hospitalizations, surgeries, and ER visits in previous 12 months . Vitals . Screenings to include cognitive, depression, and falls . Referrals and appointments  In addition, I have reviewed and discussed with patient certain preventive protocols, quality metrics, and best practice recommendations. A written personalized care plan for preventive services as well as general preventive health recommendations were provided to patient.  Sharee Holster, NP   03/08/2021

## 2021-03-08 NOTE — Patient Instructions (Signed)
  Ms. Convey , Thank you for taking time to come for your Medicare Wellness Visit. I appreciate your ongoing commitment to your health goals. Please review the following plan we discussed and let me know if I can assist you in the future.   These are the goals we discussed: Goals    . Absence of Fall and Fall-Related Injury     Evidence-based guidance:   Assess fall risk using a validated tool when available. Consider balance and gait impairment, muscle weakness, diminished vision or hearing, environmental hazards, presence of urinary or bowel urgency and/or incontinence.   Communicate fall injury risk to interprofessional healthcare team.   Develop a fall prevention plan with the patient and family.   Promote use of personal vision and auditory aids.   Promote reorientation, appropriate sensory stimulation, and routines to decrease risk of fall when changes in mental status are present.   Assess assistance level required for safe and effective self-care; consider referral for home care.   Encourage physical activity, such as performance of self-care at highest level of ability, strength and balance exercise program, and provision of appropriate assistive devices; refer to rehabilitation therapy.   Refer to community-based fall prevention program where available.   If fall occurs, determine the cause and revise fall injury prevention plan.   Regularly review medication contribution to fall risk; consider risk related to polypharmacy and age.   Refer to pharmacist for consultation when concerns about medications are revealed.   Balance adequate pain management with potential for oversedation.   Provide guidance related to environmental modifications.   Consider supplementation with Vitamin D.   Notes:     . Follow up with Primary Care Provider    . General - Client will not be readmitted within 30 days ([facility])       This is a list of the screening recommended for you and due  dates:  Health Maintenance  Topic Date Due  . Tetanus Vaccine  Never done  . Pneumonia vaccines (1 of 2 - PCV13) Never done  . Urine Protein Check  04/02/2021  . Complete foot exam   04/21/2021  . Hemoglobin A1C  06/29/2021  . Eye exam for diabetics  07/15/2021  . Flu Shot  Completed  . DEXA scan (bone density measurement)  Completed  . COVID-19 Vaccine  Completed  .  Hepatitis C: One time screening is recommended by Center for Disease Control  (CDC) for  adults born from [birth year] through 1965.   Completed  . HPV Vaccine  Aged Out

## 2021-03-18 ENCOUNTER — Other Ambulatory Visit (HOSPITAL_COMMUNITY)
Admission: RE | Admit: 2021-03-18 | Discharge: 2021-03-18 | Disposition: A | Discharge status: Home / Self Care | Payer: Medicare Other | Source: Skilled Nursing Facility | Attending: Adult Health | Admitting: Adult Health

## 2021-03-18 DIAGNOSIS — E119 Type 2 diabetes mellitus without complications: Secondary | ICD-10-CM | POA: Diagnosis present

## 2021-03-18 LAB — LIPID PANEL
Cholesterol: 169 mg/dL (ref 0–200)
HDL: 55 mg/dL (ref >40)
LDL Cholesterol: 70 mg/dL (ref 0–99)
Total CHOL/HDL Ratio: 3.1 RATIO
Triglycerides: 222 mg/dL — ABNORMAL HIGH (ref <150)
VLDL: 44 mg/dL — ABNORMAL HIGH (ref 0–40)

## 2021-03-18 LAB — HEMOGLOBIN A1C
Hgb A1c MFr Bld: 7.5 % — ABNORMAL HIGH (ref 4.8–5.6)
Mean Plasma Glucose: 168.55 mg/dL

## 2021-03-24 ENCOUNTER — Encounter: Payer: Self-pay | Admitting: Adult Health

## 2021-03-24 ENCOUNTER — Non-Acute Institutional Stay (SKILLED_NURSING_FACILITY): Payer: Medicare Other | Admitting: Adult Health

## 2021-03-24 DIAGNOSIS — N3281 Overactive bladder: Secondary | ICD-10-CM

## 2021-03-24 NOTE — Progress Notes (Signed)
Location:  [facility] Room Number: 222 Place of Service:  SNF (31)   CODE STATUS: full code   Allergies  Allergen Reactions  . Lisinopril   . Penicillins Nausea Only    Has patient had a PCN reaction causing immediate rash, facial/tongue/throat swelling, SOB or lightheadedness with hypotension: Yes Has patient had a PCN reaction causing severe rash involving mucus membranes or skin necrosis: No Has patient had a PCN reaction that required hospitalization No Has patient had a PCN reaction occurring within the last 10 years: No If all of the above answers are "NO", then may proceed with Cephalosporin use.   . Sulfa Antibiotics Nausea And Vomiting    Chief Complaint  Patient presents with  . Acute Visit    HS urinary urgency     HPI:  She reports to staff that she is getting up all night to go to the bathroom. She states that going to the bathroom is causing her to be upset during the day; she does have some tears present. She denies any dysuria; no worsening incontinence; no pain.    Past Medical History:  Diagnosis Date  . Anxiety   . Bowel obstruction (HCC)   . Burning with urination 01/04/2016  . Diabetes mellitus without complication (HCC)   . Genital herpes   . GERD (gastroesophageal reflux disease)   . Hematuria 01/04/2016  . Herpes 12/22/2015  . Hyperlipidemia   . Hypertension   . LLQ abdominal tenderness 01/04/2016  . Neuropathy   . Pelvic pressure in female 01/04/2016  . Recurrent UTI   . Sciatic nerve disease   . Small bowel obstruction (HCC) 04/27/2016  . Urinary frequency 01/04/2016  . Urticaria     Past Surgical History:  Procedure Laterality Date  . ABDOMINAL HYSTERECTOMY    . APPLICATION OF INTRAOPERATIVE CT SCAN N/A 12/19/2018   Procedure: APPLICATION OF INTRAOPERATIVE CT SCAN;  Surgeon: Maeola Harman, MD;  Location: [facility] OR;  Service: Neurosurgery;  Laterality: N/A;  . bowel obstruction    . COLON SURGERY     blocked colon  .  POSTERIOR CERVICAL FUSION/FORAMINOTOMY N/A 12/19/2018   Procedure: Cervical Seven to Thoracic One Posterior cervical fusion;  Surgeon: Maeola Harman, MD;  Location: [facility] OR;  Service: Neurosurgery;  Laterality: N/A;  Cervical Seven to Thoracic One Posterior cervical fusion    Social History   Socioeconomic History  . Marital status: Widowed    Spouse name: Not on file  . Number of children: Not on file  . Years of education: Not on file  . Highest education level: Not on file  Occupational History  . Occupation: retired   Tobacco Use  . Smoking status: Never Smoker  . Smokeless tobacco: Current User    Types: Snuff  Vaping Use  . Vaping Use: Never used  Substance and Sexual Activity  . Alcohol use: No  . Drug use: No  . Sexual activity: Not Currently    Birth control/protection: Surgical    Comment: hyst  Other Topics Concern  . Not on file  Social History Narrative   Long term resident of [facility]    Social Determinants of Health   Financial Resource Strain: Not on file  Food Insecurity: Not on file  Transportation Needs: Not on file  Physical Activity: Not on file  Stress: Not on file  Social Connections: Not on file  Intimate Partner Violence: Not on file   Family History  Problem Relation Age of Onset  .  Diabetes Mother   . Heart disease Brother   . Hypertension Brother   . Diabetes Brother   . Hypertension Daughter   . Diabetes Brother   . Diabetes Brother   . Alzheimer's disease Brother   . Anxiety disorder Daughter       VITAL SIGNS BP (!) 143/81   Pulse 75   Temp 98.6 F (37 C)   Resp 18   Ht 5' (1.524 m)   Wt 189 lb 3.2 oz (85.8 kg)   BMI 36.95 kg/m   Outpatient Encounter Medications as of 03/24/2021  Medication Sig  . acetaminophen (TYLENOL) 650 MG CR tablet Take 650 mg by mouth every 6 (six) hours.  Marland Kitchen amLODipine (NORVASC) 10 MG tablet Take 1 tablet (10 mg total) by mouth daily.  Marland Kitchen aspirin 81 MG chewable tablet Chew 81 mg by mouth daily.  Lucilla Lame [place]-Castor Oil (VENELEX) OINT Apply topically. apply to residents buttocks q shift PRN Every Shift - PRN  . cyanocobalamin 1000 MCG tablet Take 1,000 mcg by mouth daily. Special Instructions: for vit B 12 deficiency  . Dextromethorphan-guaiFENesin (ROBITUSSIN DM PO) Take 15 mLs by mouth every 6 (six) hours.  . diclofenac Sodium (VOLTAREN) 1 % GEL Apply 2 g topically 2 (two) times daily.  . DULoxetine (CYMBALTA) 20 MG capsule Take 20 mg by mouth 2 (two) times daily.  . Emollient (EUCERIN) lotion Apply topically 4 (four) times daily as needed for dry skin.  Marland Kitchen lamoTRIgine (LAMICTAL) 25 MG tablet Take 25 mg by mouth 2 (two) times daily.   Marland Kitchen linagliptin (TRADJENTA) 5 MG TABS tablet Take 5 mg by mouth daily.  Marland Kitchen loratadine (CLARITIN) 10 MG tablet Take 10 mg by mouth daily.  . Melatonin 5 MG TABS Take 5 mg by mouth at bedtime.  . Menthol, Topical Analgesic, 4 % GEL Apply 1 application topically daily as needed (FOR PAIN).   Marland Kitchen metFORMIN (GLUCOPHAGE) 1000 MG tablet Take 1,000 mg by mouth 2 (two) times daily with a meal.   . montelukast (SINGULAIR) 10 MG tablet Take 1 tablet (10 mg total) by mouth at bedtime.  . NON FORMULARY Diet:  Consistent Carbohydrate,  . pantoprazole (PROTONIX) 40 MG tablet Take 40 mg by mouth every morning.  . polyethylene glycol (MIRALAX / GLYCOLAX) packet Take 17 g by mouth daily.  Marland Kitchen senna-docusate (SENOKOT-S) 8.6-50 MG tablet Take 1 tablet by mouth at bedtime.   . simvastatin (ZOCOR) 10 MG tablet Take 10 mg by mouth at bedtime.   . triamcinolone (NASACORT) 55 MCG/ACT AERO nasal inhaler Place 1 spray into the nose daily.  . valACYclovir (VALTREX) 500 MG tablet Take 500 mg by mouth daily.   . [DISCONTINUED] gabapentin (NEURONTIN) 300 MG capsule Take 300 mg by mouth 2 (two) times daily. Reported on 02/03/2016   No facility-administered encounter medications on file as of 03/24/2021.     SIGNIFICANT DIAGNOSTIC EXAMS   PREVIOUS  07-16-20 DEXA: t score 0.825   NO NEW  EXAMS.   LABS REVIEWED PREVIOUS;   04-02-20; urine for micro-albumin <3.0  05-14-20: wbc 8.8; hgb 12.4; hct 39.7; mcv 88.4 plt 199; hgb a1c 6.8 vit B 12: 154  08-27-20: hepatitis C: neg 08-31-20: urine culture: multiple species 09-21-20: urine culture: e-coli: cipro  10-01-20: wbc 14.8; hgb 13.5; hct 43.3; mcv 87.7 plt 240; glucose 146; bun 19; creat 1.05; k+ 4.4; na++ 136; ca 9.7 vitamin B 12: 877; folate 13.9; tsh 4.543 11-16-20: wbc 11.7; hgb 12.7; hct 40.9; mcv 887 plt  213 urine culture: klebsiella pneumoniae: cipro 12-19-20: urine culture: e-coli 12-30-20: hgb a1c 6.9   TODAY  02-26-21: urine culture: 80,000 e-coli 03-18-21: hgb a1c 7.5 chol 169; ldl 70; trig 222; hdl 55   Review of Systems  Constitutional: Negative for malaise/fatigue.  Respiratory: Negative for cough and shortness of breath.   Cardiovascular: Negative for chest pain, palpitations and leg swelling.  Gastrointestinal: Negative for abdominal pain, constipation and heartburn.  Genitourinary: Positive for frequency.  Musculoskeletal: Negative for back pain, joint pain and myalgias.  Skin: Negative.   Neurological: Negative for dizziness.  Psychiatric/Behavioral: The patient is not nervous/anxious.     Physical Exam Constitutional:      General: She is not in acute distress.    Appearance: She is well-developed. She is not diaphoretic.  Neck:     Thyroid: No thyromegaly.  Cardiovascular:     Rate and Rhythm: Normal rate and regular rhythm.     Pulses: Normal pulses.     Heart sounds: Normal heart sounds.  Pulmonary:     Effort: Pulmonary effort is normal. No respiratory distress.     Breath sounds: Normal breath sounds.  Abdominal:     General: Bowel sounds are normal. There is no distension.     Palpations: Abdomen is soft.     Tenderness: There is no abdominal tenderness.  Musculoskeletal:        General: Normal range of motion.     Cervical back: Neck supple.     Right lower leg: No edema.     Left lower  leg: No edema.  Lymphadenopathy:     Cervical: No cervical adenopathy.  Skin:    General: Skin is warm and dry.  Neurological:     Mental Status: She is alert. Mental status is at baseline.  Psychiatric:        Mood and Affect: Mood normal.       ASSESSMENT/ PLAN:  TODAY  1. OAB (overactive bladder): is worse will begin myrbetriq 25 mg nightly will monitor her status.      Synthia Innocent NP [facility]  Contact [phone] Monday through Friday 8am- 5pm  After hours call [phone]

## 2021-03-29 ENCOUNTER — Encounter: Payer: Self-pay | Admitting: Adult Health

## 2021-03-29 ENCOUNTER — Non-Acute Institutional Stay (SKILLED_NURSING_FACILITY): Payer: Medicare Other | Admitting: Adult Health

## 2021-03-29 DIAGNOSIS — R52 Pain, unspecified: Secondary | ICD-10-CM | POA: Diagnosis not present

## 2021-03-29 DIAGNOSIS — E1159 Type 2 diabetes mellitus with other circulatory complications: Secondary | ICD-10-CM | POA: Diagnosis not present

## 2021-03-29 DIAGNOSIS — E1169 Type 2 diabetes mellitus with other specified complication: Secondary | ICD-10-CM

## 2021-03-29 DIAGNOSIS — I152 Hypertension secondary to endocrine disorders: Secondary | ICD-10-CM

## 2021-03-29 DIAGNOSIS — E785 Hyperlipidemia, unspecified: Secondary | ICD-10-CM

## 2021-03-29 DIAGNOSIS — G8929 Other chronic pain: Secondary | ICD-10-CM | POA: Diagnosis not present

## 2021-03-29 DIAGNOSIS — F339 Major depressive disorder, recurrent, unspecified: Secondary | ICD-10-CM

## 2021-03-29 NOTE — Progress Notes (Signed)
Location:  [facility] Room Number: 115/W Place of Service:  SNF (31)   CODE STATUS: Full Code  Allergies  Allergen Reactions  . Lisinopril   . Penicillins Nausea Only    Has patient had a PCN reaction causing immediate rash, facial/tongue/throat swelling, SOB or lightheadedness with hypotension: Yes Has patient had a PCN reaction causing severe rash involving mucus membranes or skin necrosis: No Has patient had a PCN reaction that required hospitalization No Has patient had a PCN reaction occurring within the last 10 years: No If all of the above answers are "NO", then may proceed with Cephalosporin use.   . Sulfa Antibiotics Nausea And Vomiting    Chief Complaint  Patient presents with  . Medical Management of Chronic Issues           Chronic constipation:   Major depression recurrent chronic:    Hypertension associated with type 2 diabetes mellitus:  Dyslipidemia associated with type 2 diabetes mellitus      HPI:  She is a 80 year old long term resident of this facility being seen for the management of her chronic illnesses:Chronic constipation:   Major depression recurrent chronic:    Hypertension associated with type 2 diabetes mellitus:  Dyslipidemia associated with type 2 diabetes mellitus.  There are no reports of uncontrolled pain; no reports of changes in appetite; no further reports of frequent urination during the night.    Past Medical History:  Diagnosis Date  . Anxiety   . Bowel obstruction (HCC)   . Burning with urination 01/04/2016  . Diabetes mellitus without complication (HCC)   . Genital herpes   . GERD (gastroesophageal reflux disease)   . Hematuria 01/04/2016  . Herpes 12/22/2015  . Hyperlipidemia   . Hypertension   . LLQ abdominal tenderness 01/04/2016  . Neuropathy   . Pelvic pressure in female 01/04/2016  . Recurrent UTI   . Sciatic nerve disease   . Small bowel obstruction (HCC) 04/27/2016  . Urinary frequency 01/04/2016  .  Urticaria     Past Surgical History:  Procedure Laterality Date  . ABDOMINAL HYSTERECTOMY    . APPLICATION OF INTRAOPERATIVE CT SCAN N/A 12/19/2018   Procedure: APPLICATION OF INTRAOPERATIVE CT SCAN;  Surgeon: Maeola HarmanStern, Joseph, MD;  [facility];  Service: Neurosurgery;  Laterality: N/A;  . bowel obstruction    . COLON SURGERY     blocked colon  . POSTERIOR CERVICAL FUSION/FORAMINOTOMY N/A 12/19/2018   Procedure: Cervical Seven to Thoracic One Posterior cervical fusion;  Surgeon: Maeola HarmanStern, Joseph, MD;  [facility];  Service: Neurosurgery;  Laterality: N/A;  Cervical Seven to Thoracic One Posterior cervical fusion    Social History   Socioeconomic History  . Marital status: Widowed    Spouse name: Not on file  . Number of children: Not on file  . Years of education: Not on file  . Highest education level: Not on file  Occupational History  . Occupation: retired   Tobacco Use  . Smoking status: Never Smoker  . Smokeless tobacco: Current User    Types: Snuff  Vaping Use  . Vaping Use: Never used  Substance and Sexual Activity  . Alcohol use: No  . Drug use: No  . Sexual activity: Not Currently    Birth control/protection: Surgical    Comment: hyst  Other Topics Concern  . Not on file  Social History Narrative   [facility]    Social Determinants of Health  Financial Resource Strain: Not on file  Food Insecurity: Not on file  Transportation Needs: Not on file  Physical Activity: Not on file  Stress: Not on file  Social Connections: Not on file  Intimate Partner Violence: Not on file   Family History  Problem Relation Age of Onset  . Diabetes Mother   . Heart disease Brother   . Hypertension Brother   . Diabetes Brother   . Hypertension Daughter   . Diabetes Brother   . Diabetes Brother   . Alzheimer's disease Brother   . Anxiety disorder Daughter       VITAL SIGNS BP (!) 147/72   Pulse 86   Temp 98.2 F (36.8 C)   Ht 5' (1.524 m)   Wt  189 lb 3.2 oz (85.8 kg)   BMI 36.95 kg/m   Outpatient Encounter Medications as of 03/29/2021  Medication Sig  . acetaminophen (TYLENOL) 650 MG CR tablet Take 650 mg by mouth every 6 (six) hours.  Marland Kitchen amLODipine (NORVASC) 10 MG tablet Take 1 tablet (10 mg total) by mouth daily.  Marland Kitchen aspirin 81 MG chewable tablet Chew 81 mg by mouth daily.  Lucilla Lame [place]-Castor Oil (VENELEX) OINT Apply topically. apply to residents buttocks q shift PRN Every Shift - PRN  . cyanocobalamin 1000 MCG tablet Take 1,000 mcg by mouth daily. Special Instructions: for vit B 12 deficiency  . diclofenac Sodium (VOLTAREN) 1 % GEL Apply 2 g topically 2 (two) times daily.  . DULoxetine (CYMBALTA) 20 MG capsule Take 20 mg by mouth 2 (two) times daily.  . Emollient (EUCERIN) lotion Apply topically 4 (four) times daily as needed for dry skin.  Marland Kitchen gabapentin (NEURONTIN) 100 MG capsule Take 100 mg by mouth at bedtime.  . lamoTRIgine (LAMICTAL) 25 MG tablet Take 25 mg by mouth 2 (two) times daily.   Marland Kitchen linagliptin (TRADJENTA) 5 MG TABS tablet Take 5 mg by mouth daily.  Marland Kitchen loratadine (CLARITIN) 10 MG tablet Take 10 mg by mouth daily.  . Melatonin 5 MG TABS Take 5 mg by mouth at bedtime.  . Menthol, Topical Analgesic, 4 % GEL Apply 1 application topically daily as needed (FOR PAIN).   Marland Kitchen metFORMIN (GLUCOPHAGE) 1000 MG tablet Take 1,000 mg by mouth 2 (two) times daily with a meal.   . mirabegron ER (MYRBETRIQ) 25 MG TB24 tablet Take 25 mg by mouth at bedtime.  . montelukast (SINGULAIR) 10 MG tablet Take 1 tablet (10 mg total) by mouth at bedtime.  . NON FORMULARY Diet:  Consistent Carbohydrate,  . omeprazole (PRILOSEC) 20 MG capsule Take 20 mg by mouth daily. Special Instructions: TAKE 1 CAPSULE BY MOUTH EVERY MORNING *TAKE ON AN EMPTY STOMACH* *DO NOT CRUSH* (FORMULARY SUB FOR  . polyethylene glycol (MIRALAX / GLYCOLAX) packet Take 17 g by mouth daily.  Marland Kitchen senna-docusate (SENOKOT-S) 8.6-50 MG tablet Take 1 tablet by mouth at bedtime.   .  simvastatin (ZOCOR) 10 MG tablet Take 10 mg by mouth at bedtime.   . triamcinolone (NASACORT) 55 MCG/ACT AERO nasal inhaler Place 1 spray into the nose daily.  . valACYclovir (VALTREX) 500 MG tablet Take 500 mg by mouth daily.    No facility-administered encounter medications on file as of 03/29/2021.     SIGNIFICANT DIAGNOSTIC EXAMS   PREVIOUS  12-18-19: chest x-ray: Mild right upper lobe and bibasilar airspace opacities which could reflect early pneumonia (including atypical/viral infection).  12-17-18; ct of abdomen and pelvis: Slight edema of the mucosa of the distal  antrum of the stomach. This could represent gastritis. Otherwise, benign-appearing abdomen and pelvis.  07-16-20 DEXA: t score 0.825   NO NEW EXAMS.   LABS REVIEWED PREVIOUS;   04-02-20; urine for micro-albumin <3.0  05-14-20: wbc 8.8; hgb 12.4; hct 39.7; mcv 88.4 plt 199; hgb a1c 6.8 vit B 12: 154  08-27-20: hepatitis C: neg 08-31-20: urine culture: multiple species 09-21-20: urine culture: e-coli: cipro  10-01-20: wbc 14.8; hgb 13.5; hct 43.3; mcv 87.7 plt 240; glucose 146; bun 19; creat 1.05; k+ 4.4; na++ 136; ca 9.7 vitamin B 12: 877; folate 13.9; tsh 4.543 11-16-20: wbc 11.7; hgb 12.7; hct 40.9; mcv 887 plt 213 urine culture: klebsiella pneumoniae: cipro 12-19-20: urine culture: e-coli 12-30-20: hgb a1c 6.9  02-26-21: urine culture: 80,000 e-coli 03-18-21: hgb a1c 7.5 chol 169; ldl 70; trig 222; hdl 55  NO NEW LABS.   Review of Systems  Constitutional: Negative for malaise/fatigue.  Respiratory: Negative for cough and shortness of breath.   Cardiovascular: Negative for chest pain, palpitations and leg swelling.  Gastrointestinal: Negative for abdominal pain, constipation and heartburn.  Musculoskeletal: Negative for back pain, joint pain and myalgias.  Skin: Negative.   Neurological: Negative for dizziness.  Psychiatric/Behavioral: The patient is not nervous/anxious.       Physical Exam Constitutional:       General: She is not in acute distress.    Appearance: She is well-developed. She is not diaphoretic.  Neck:     Thyroid: No thyromegaly.  Cardiovascular:     Rate and Rhythm: Normal rate and regular rhythm.     Pulses: Normal pulses.     Heart sounds: Normal heart sounds.  Pulmonary:     Effort: Pulmonary effort is normal. No respiratory distress.     Breath sounds: Normal breath sounds.  Abdominal:     General: Bowel sounds are normal. There is no distension.     Palpations: Abdomen is soft.     Tenderness: There is no abdominal tenderness.  Musculoskeletal:        General: Normal range of motion.     Cervical back: Neck supple.     Right lower leg: No edema.     Left lower leg: No edema.  Lymphadenopathy:     Cervical: No cervical adenopathy.  Skin:    General: Skin is warm and dry.  Neurological:     Mental Status: She is alert. Mental status is at baseline.  Psychiatric:        Mood and Affect: Mood normal.     ASSESSMENT/ PLAN:  TODAY  1. Chronic constipation: is stable will continue miralax daily and senna s daily   2. Major depression recurrent chronic: is stable will continue cymbalta 20 mg twice daily lamictal 25 mg twice daily for mood and melatonin 5 mg nightly   3. Hypertension associated with type 2 diabetes mellitus: is stable b/p 142/77 will continue norvasc 10 mg daily asa 81 mg daily   4. Dyslipidemia associated with type 2 diabetes mellitus is stable LDL 70 will continue zocor 10 mg daily    PREVIOUS   5. Aortic atherosclerosis:(ct 12-18-19)  is stable will monitor  6. Non-insulin dependent type 2 diabetes mellitus: is stable hgb a1c 7.5 will continue metformin 1 gm twice daily tradjenta 5 mg daily   On ace and statin  7. Gastroesophagitis reflux disease without esophagitis: is stable will continue prilosec 20 mg daily   8. OAB is stable will continue myrbetriq 25 mg daily  9. Chronic generalized pain: is stable will continue tylenol cr 650 mg  every 6 hours voltaren gel 2 gm to both knees twice daily as needed  cymbalta 20 mg twice daily   10. Chronic non-seasonal allergic rhinitis: is stable will continue claritin 10 mg daily; nasocort daily  and singulair 10 mg daily   11. Herpes: is stable no recent outbreaks will continue valtrex 500 mg daily   12. Vitamin B12: deficiency is stable level 877 will continue 1000 mcg daily   Will check cbc; cmp   Synthia Innocent NP [facility]  Contact [phone] Monday through Friday 8am- 5pm  After hours call [phone]

## 2021-03-30 DIAGNOSIS — N3281 Overactive bladder: Secondary | ICD-10-CM | POA: Insufficient documentation

## 2021-04-05 ENCOUNTER — Other Ambulatory Visit (HOSPITAL_COMMUNITY)
Admission: RE | Admit: 2021-04-05 | Discharge: 2021-04-05 | Disposition: A | Discharge status: Home / Self Care | Payer: Medicare Other | Source: Skilled Nursing Facility | Attending: Adult Health | Admitting: Adult Health

## 2021-04-05 DIAGNOSIS — E119 Type 2 diabetes mellitus without complications: Secondary | ICD-10-CM | POA: Diagnosis present

## 2021-04-05 LAB — CBC
HCT: 37.2 % (ref 36.0–46.0)
Hemoglobin: 11.5 g/dL — ABNORMAL LOW (ref 12.0–15.0)
MCH: 27.1 pg (ref 26.0–34.0)
MCHC: 30.9 g/dL (ref 30.0–36.0)
MCV: 87.7 fL (ref 80.0–100.0)
Platelets: 184 10*3/uL (ref 150–400)
RBC: 4.24 MIL/uL (ref 3.87–5.11)
RDW: 15.3 % (ref 11.5–15.5)
WBC: 8.6 10*3/uL (ref 4.0–10.5)
nRBC: 0 % (ref 0.0–0.2)

## 2021-04-05 LAB — COMPREHENSIVE METABOLIC PANEL
ALT: 21 U/L (ref 0–44)
AST: 19 U/L (ref 15–41)
Albumin: 3.6 g/dL (ref 3.5–5.0)
Alkaline Phosphatase: 79 U/L (ref 38–126)
Anion gap: 11 (ref 5–15)
BUN: 18 mg/dL (ref 8–23)
CO2: 26 mmol/L (ref 22–32)
Calcium: 8.9 mg/dL (ref 8.9–10.3)
Chloride: 98 mmol/L (ref 98–111)
Creatinine, Ser: 0.85 mg/dL (ref 0.44–1.00)
GFR, Estimated: >60 mL/min (ref >60)
Glucose, Bld: 180 mg/dL — ABNORMAL HIGH (ref 70–99)
Potassium: 3.7 mmol/L (ref 3.5–5.1)
Sodium: 135 mmol/L (ref 135–145)
Total Bilirubin: 0.2 mg/dL — ABNORMAL LOW (ref 0.3–1.2)
Total Protein: 6.2 g/dL — ABNORMAL LOW (ref 6.5–8.1)

## 2021-04-08 ENCOUNTER — Encounter: Payer: Self-pay | Admitting: Adult Health

## 2021-04-08 ENCOUNTER — Non-Acute Institutional Stay (SKILLED_NURSING_FACILITY): Payer: Medicare Other | Admitting: Adult Health

## 2021-04-08 ENCOUNTER — Other Ambulatory Visit (HOSPITAL_COMMUNITY)
Admission: RE | Admit: 2021-04-08 | Discharge: 2021-04-08 | Disposition: A | Discharge status: Home / Self Care | Payer: Medicare Other | Source: Skilled Nursing Facility | Attending: Adult Health | Admitting: Adult Health

## 2021-04-08 DIAGNOSIS — N1831 Chronic kidney disease, stage 3a: Secondary | ICD-10-CM

## 2021-04-08 DIAGNOSIS — I700 Atherosclerosis of aorta: Secondary | ICD-10-CM

## 2021-04-08 DIAGNOSIS — I10 Essential (primary) hypertension: Secondary | ICD-10-CM | POA: Diagnosis present

## 2021-04-08 DIAGNOSIS — F339 Major depressive disorder, recurrent, unspecified: Secondary | ICD-10-CM | POA: Diagnosis not present

## 2021-04-08 DIAGNOSIS — G9520 Unspecified cord compression: Secondary | ICD-10-CM

## 2021-04-08 LAB — BASIC METABOLIC PANEL
Anion gap: 10 (ref 5–15)
BUN: 22 mg/dL (ref 8–23)
CO2: 27 mmol/L (ref 22–32)
Calcium: 8.8 mg/dL — ABNORMAL LOW (ref 8.9–10.3)
Chloride: 100 mmol/L (ref 98–111)
Creatinine, Ser: 0.89 mg/dL (ref 0.44–1.00)
GFR, Estimated: >60 mL/min (ref >60)
Glucose, Bld: 148 mg/dL — ABNORMAL HIGH (ref 70–99)
Potassium: 3.7 mmol/L (ref 3.5–5.1)
Sodium: 137 mmol/L (ref 135–145)

## 2021-04-08 NOTE — Progress Notes (Signed)
Location:  [facility] Room Number: 222 Place of Service:  SNF (31)   CODE STATUS: full code   Allergies  Allergen Reactions  . Lisinopril   . Penicillins Nausea Only    Has patient had a PCN reaction causing immediate rash, facial/tongue/throat swelling, SOB or lightheadedness with hypotension: Yes Has patient had a PCN reaction causing severe rash involving mucus membranes or skin necrosis: No Has patient had a PCN reaction that required hospitalization No Has patient had a PCN reaction occurring within the last 10 years: No If all of the above answers are "NO", then may proceed with Cephalosporin use.   . Sulfa Antibiotics Nausea And Vomiting    Chief Complaint  Patient presents with  . Acute Visit    Care plan meeting.     HPI:  We have come together for her care plan meeting. BIMS 13/15 mood 2/30. She is independent to supervision with her adls. She is able to feed herself. She is incontinent of bladder and bowel. Her cbg readings are stable. She was started on myrbetriq due to complaints of frequent urination at night. She does on LOA's with family. Her cbg readings are stable. Therapy none      Weight stable at 185 pounds has good appetite Carb consistent diet.  She will continue to be followed for her chronic illnesses including:  Major depression recurrent chronic  Cord compression  Stage 3a chronic kidney disease  Aortic atherosclerosis   Past Medical History:  Diagnosis Date  . Anxiety   . Bowel obstruction (HCC)   . Burning with urination 01/04/2016  . Diabetes mellitus without complication (HCC)   . Genital herpes   . GERD (gastroesophageal reflux disease)   . Hematuria 01/04/2016  . Herpes 12/22/2015  . Hyperlipidemia   . Hypertension   . LLQ abdominal tenderness 01/04/2016  . Neuropathy   . Pelvic pressure in female 01/04/2016  . Recurrent UTI   . Sciatic nerve disease   . Small bowel obstruction (HCC) 04/27/2016  . Urinary frequency  01/04/2016  . Urticaria     Past Surgical History:  Procedure Laterality Date  . ABDOMINAL HYSTERECTOMY    . APPLICATION OF INTRAOPERATIVE CT SCAN N/A 12/19/2018   Procedure: APPLICATION OF INTRAOPERATIVE CT SCAN;  Surgeon: Maeola Harman, MD;  Location: [facility] OR;  Service: Neurosurgery;  Laterality: N/A;  . bowel obstruction    . COLON SURGERY     blocked colon  . POSTERIOR CERVICAL FUSION/FORAMINOTOMY N/A 12/19/2018   Procedure: Cervical Seven to Thoracic One Posterior cervical fusion;  Surgeon: Maeola Harman, MD;  Location: [facility] OR;  Service: Neurosurgery;  Laterality: N/A;  Cervical Seven to Thoracic One Posterior cervical fusion    Social History   Socioeconomic History  . Marital status: Widowed    Spouse name: Not on file  . Number of children: Not on file  . Years of education: Not on file  . Highest education level: Not on file  Occupational History  . Occupation: retired   Tobacco Use  . Smoking status: Never Smoker  . Smokeless tobacco: Current User    Types: Snuff  Vaping Use  . Vaping Use: Never used  Substance and Sexual Activity  . Alcohol use: No  . Drug use: No  . Sexual activity: Not Currently    Birth control/protection: Surgical    Comment: hyst  Other Topics Concern  . Not on file  Social History Narrative   Long term resident of [facility]  Social Determinants of Health   Financial Resource Strain: Not on file  Food Insecurity: Not on file  Transportation Needs: Not on file  Physical Activity: Not on file  Stress: Not on file  Social Connections: Not on file  Intimate Partner Violence: Not on file   Family History  Problem Relation Age of Onset  . Diabetes Mother   . Heart disease Brother   . Hypertension Brother   . Diabetes Brother   . Hypertension Daughter   . Diabetes Brother   . Diabetes Brother   . Alzheimer's disease Brother   . Anxiety disorder Daughter       VITAL SIGNS BP (!) 145/82   Pulse 68   Temp 98.2 F (36.8 C)   Resp 18    Ht 5' (1.524 m)   Wt 189 lb 3.2 oz (85.8 kg)   BMI 36.95 kg/m   Outpatient Encounter Medications as of 04/08/2021  Medication Sig  . acetaminophen (TYLENOL) 650 MG CR tablet Take 650 mg by mouth every 6 (six) hours.  Marland Kitchen amLODipine (NORVASC) 10 MG tablet Take 1 tablet (10 mg total) by mouth daily.  Marland Kitchen aspirin 81 MG chewable tablet Chew 81 mg by mouth daily.  Lucilla Lame [place]-Castor Oil (VENELEX) OINT Apply topically. apply to residents buttocks q shift PRN Every Shift - PRN  . cyanocobalamin 1000 MCG tablet Take 1,000 mcg by mouth daily. Special Instructions: for vit B 12 deficiency  . diclofenac Sodium (VOLTAREN) 1 % GEL Apply 2 g topically 2 (two) times daily.  . DULoxetine (CYMBALTA) 20 MG capsule Take 20 mg by mouth 2 (two) times daily.  . Emollient (EUCERIN) lotion Apply topically 4 (four) times daily as needed for dry skin.  Marland Kitchen gabapentin (NEURONTIN) 100 MG capsule Take 100 mg by mouth at bedtime.  . lamoTRIgine (LAMICTAL) 25 MG tablet Take 25 mg by mouth 2 (two) times daily.   Marland Kitchen linagliptin (TRADJENTA) 5 MG TABS tablet Take 5 mg by mouth daily.  Marland Kitchen loratadine (CLARITIN) 10 MG tablet Take 10 mg by mouth daily.  . Melatonin 5 MG TABS Take 5 mg by mouth at bedtime.  . Menthol, Topical Analgesic, 4 % GEL Apply 1 application topically daily as needed (FOR PAIN).   Marland Kitchen metFORMIN (GLUCOPHAGE) 1000 MG tablet Take 1,000 mg by mouth 2 (two) times daily with a meal.   . mirabegron ER (MYRBETRIQ) 25 MG TB24 tablet Take 25 mg by mouth at bedtime.  . montelukast (SINGULAIR) 10 MG tablet Take 1 tablet (10 mg total) by mouth at bedtime.  . NON FORMULARY Diet:  Consistent Carbohydrate,  . omeprazole (PRILOSEC) 20 MG capsule Take 20 mg by mouth daily. Special Instructions: TAKE 1 CAPSULE BY MOUTH EVERY MORNING *TAKE ON AN EMPTY STOMACH* *DO NOT CRUSH* (FORMULARY SUB FOR  . polyethylene glycol (MIRALAX / GLYCOLAX) packet Take 17 g by mouth daily.  Marland Kitchen senna-docusate (SENOKOT-S) 8.6-50 MG tablet Take 1 tablet  by mouth at bedtime.   . simvastatin (ZOCOR) 10 MG tablet Take 10 mg by mouth at bedtime.   . triamcinolone (NASACORT) 55 MCG/ACT AERO nasal inhaler Place 1 spray into the nose daily.  . valACYclovir (VALTREX) 500 MG tablet Take 500 mg by mouth daily.    No facility-administered encounter medications on file as of 04/08/2021.     SIGNIFICANT DIAGNOSTIC EXAMS   PREVIOUS  07-16-20 DEXA: t score 0.825   NO NEW EXAMS.   LABS REVIEWED PREVIOUS;   04-02-20; urine for micro-albumin <3.0  05-14-20:  wbc 8.8; hgb 12.4; hct 39.7; mcv 88.4 plt 199; hgb a1c 6.8 vit B 12: 154  08-27-20: hepatitis C: neg 08-31-20: urine culture: multiple species 09-21-20: urine culture: e-coli: cipro  10-01-20: wbc 14.8; hgb 13.5; hct 43.3; mcv 87.7 plt 240; glucose 146; bun 19; creat 1.05; k+ 4.4; na++ 136; ca 9.7 vitamin B 12: 877; folate 13.9; tsh 4.543 11-16-20: wbc 11.7; hgb 12.7; hct 40.9; mcv 887 plt 213 urine culture: klebsiella pneumoniae: cipro 12-19-20: urine culture: e-coli 12-30-20: hgb a1c 6.9  02-26-21: urine culture: 80,000 e-coli 03-18-21: hgb a1c 7.5 chol 169; ldl 70; trig 222; hdl 55  NO NEW LABS.    Review of Systems  Constitutional: Negative for malaise/fatigue.  Respiratory: Negative for cough and shortness of breath.   Cardiovascular: Negative for chest pain, palpitations and leg swelling.  Gastrointestinal: Negative for abdominal pain, constipation and heartburn.  Musculoskeletal: Negative for back pain, joint pain and myalgias.  Skin: Negative.   Neurological: Negative for dizziness.  Psychiatric/Behavioral: The patient is not nervous/anxious.     Physical Exam Constitutional:      General: She is not in acute distress.    Appearance: She is well-developed. She is not diaphoretic.  Neck:     Thyroid: No thyromegaly.  Cardiovascular:     Rate and Rhythm: Normal rate and regular rhythm.     Pulses: Normal pulses.     Heart sounds: Normal heart sounds.  Pulmonary:     Effort:  Pulmonary effort is normal. No respiratory distress.     Breath sounds: Normal breath sounds.  Abdominal:     General: Bowel sounds are normal. There is no distension.     Palpations: Abdomen is soft.     Tenderness: There is no abdominal tenderness.  Musculoskeletal:        General: Normal range of motion.     Cervical back: Neck supple.     Right lower leg: No edema.     Left lower leg: No edema.  Lymphadenopathy:     Cervical: No cervical adenopathy.  Skin:    General: Skin is warm and dry.  Neurological:     Mental Status: She is alert. Mental status is at baseline.  Psychiatric:        Mood and Affect: Mood normal.       ASSESSMENT/ PLAN:  TODAY  1. Major depression recurrent chronic 2. Cord compression 3. Stage 3a chronic kidney disease 4. Aortic atherosclerosis   Will continue current medications Will continue current plan of care Will continue to monitor her status.    Time spent with patient: 40 minutes: coordination and counseling regarding medications; goals of care overall health status.    Synthia Innocent NP [facility]  Contact [phone] Monday through Friday 8am- 5pm  After hours call [phone]

## 2021-04-09 LAB — MICROALBUMIN, URINE: Microalb, Ur: <3 ug/mL — ABNORMAL HIGH

## 2021-04-21 ENCOUNTER — Encounter: Payer: Self-pay | Admitting: Adult Health

## 2021-04-21 ENCOUNTER — Non-Acute Institutional Stay (SKILLED_NURSING_FACILITY): Payer: Medicare Other | Admitting: Adult Health

## 2021-04-21 DIAGNOSIS — K219 Gastro-esophageal reflux disease without esophagitis: Secondary | ICD-10-CM | POA: Diagnosis not present

## 2021-04-21 DIAGNOSIS — I700 Atherosclerosis of aorta: Secondary | ICD-10-CM

## 2021-04-21 DIAGNOSIS — E1151 Type 2 diabetes mellitus with diabetic peripheral angiopathy without gangrene: Secondary | ICD-10-CM

## 2021-04-21 NOTE — Progress Notes (Signed)
This encounter was created in error - please disregard.

## 2021-04-21 NOTE — Progress Notes (Signed)
Location:  [facility] Room Number: 115-W Place of Service:  SNF (31)   CODE STATUS: Full code  Allergies  Allergen Reactions  . Lisinopril   . Penicillins Nausea Only    Has patient had a PCN reaction causing immediate rash, facial/tongue/throat swelling, SOB or lightheadedness with hypotension: Yes Has patient had a PCN reaction causing severe rash involving mucus membranes or skin necrosis: No Has patient had a PCN reaction that required hospitalization No Has patient had a PCN reaction occurring within the last 10 years: No If all of the above answers are "NO", then may proceed with Cephalosporin use.   . Sulfa Antibiotics Nausea And Vomiting    Chief Complaint  Patient presents with  . 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 There are no reports of uncontrolled pain; she does get out of bed daily; no reports of changes in appetite; no reports of anxiety.   Past Medical History:  Diagnosis Date  . Anxiety   . Bowel obstruction (HCC)   . Burning with urination 01/04/2016  . Diabetes mellitus without complication (HCC)   . Genital herpes   . GERD (gastroesophageal reflux disease)   . Hematuria 01/04/2016  . Herpes 12/22/2015  . Hyperlipidemia   . Hypertension   . LLQ abdominal tenderness 01/04/2016  . Neuropathy   . Pelvic pressure in female 01/04/2016  . Recurrent UTI   . Sciatic nerve disease   . Small bowel obstruction (HCC) 04/27/2016  . Urinary frequency 01/04/2016  . Urticaria     Past Surgical History:  Procedure Laterality Date  . ABDOMINAL  HYSTERECTOMY    . APPLICATION OF INTRAOPERATIVE CT SCAN N/A 12/19/2018   Procedure: APPLICATION OF INTRAOPERATIVE CT SCAN;  Surgeon: Maeola Harman, MD;  Location: [facility] OR;  Service: Neurosurgery;  Laterality: N/A;  . bowel obstruction    . COLON SURGERY     blocked colon  . POSTERIOR CERVICAL FUSION/FORAMINOTOMY N/A 12/19/2018   Procedure: Cervical Seven to Thoracic One Posterior cervical fusion;  Surgeon: Maeola Harman, MD;  Location: [facility] OR;  Service: Neurosurgery;  Laterality: N/A;  Cervical Seven to Thoracic One Posterior cervical fusion    Social History   Socioeconomic History  . Marital status: Widowed    Spouse name: Not on file  . Number of children: Not on file  . Years of education: Not on file  . Highest education level: Not on file  Occupational History  . Occupation: retired   Tobacco Use  . Smoking status: Never Smoker  . Smokeless tobacco: Current User    Types: Snuff  Vaping Use  . Vaping Use: Never used  Substance and Sexual Activity  . Alcohol use: No  . Drug use: No  . Sexual activity: Not Currently    Birth control/protection: Surgical    Comment: hyst  Other Topics Concern  . Not on file  Social History Narrative   Long term resident of [facility]    Social Determinants of Health   Financial Resource Strain: Not on file  Food Insecurity: Not on file  Transportation Needs: Not on  file  Physical Activity: Not on file  Stress: Not on file  Social Connections: Not on file  Intimate Partner Violence: Not on file   Family History  Problem Relation Age of Onset  . Diabetes Mother   . Heart disease Brother   . Hypertension Brother   . Diabetes Brother   . Hypertension Daughter   . Diabetes Brother   . Diabetes Brother   . Alzheimer's disease Brother   . Anxiety disorder Daughter       VITAL SIGNS BP (!) 158/67   Pulse 81   Temp (!) 96.6 F (35.9 C)   Resp 18   Ht 5' (1.524 m)   Wt 191 lb (86.6 kg)   SpO2 98%   BMI 37.30 kg/m   Outpatient  Encounter Medications as of 04/21/2021  Medication Sig  . acetaminophen (TYLENOL) 650 MG CR tablet Take 650 mg by mouth every 6 (six) hours.  Marland Kitchen amLODipine (NORVASC) 10 MG tablet Take 1 tablet (10 mg total) by mouth daily.  Marland Kitchen aspirin 81 MG chewable tablet Chew 81 mg by mouth daily.  Lucilla Lame [place]-Castor Oil (VENELEX) OINT Apply topically. apply to residents buttocks q shift PRN Every Shift - PRN  . cyanocobalamin 1000 MCG tablet Take 1,000 mcg by mouth daily. Special Instructions: for vit B 12 deficiency  . diclofenac Sodium (VOLTAREN) 1 % GEL Apply 2 g topically 2 (two) times daily.  . DULoxetine (CYMBALTA) 20 MG capsule Take 20 mg by mouth 2 (two) times daily.  . Emollient (EUCERIN) lotion Apply topically 4 (four) times daily as needed for dry skin.  Marland Kitchen gabapentin (NEURONTIN) 100 MG capsule Take 100 mg by mouth at bedtime.  . lamoTRIgine (LAMICTAL) 25 MG tablet Take 25 mg by mouth 2 (two) times daily.   Marland Kitchen linagliptin (TRADJENTA) 5 MG TABS tablet Take 5 mg by mouth daily.  Marland Kitchen loratadine (CLARITIN) 10 MG tablet Take 10 mg by mouth daily.  Marland Kitchen losartan (COZAAR) 25 MG tablet Take 12.5 mg by mouth daily.  . Melatonin 5 MG TABS Take 5 mg by mouth at bedtime.  . Menthol, Topical Analgesic, 4 % GEL Apply 1 application topically daily as needed (FOR PAIN).   Marland Kitchen metFORMIN (GLUCOPHAGE) 1000 MG tablet Take 1,000 mg by mouth 2 (two) times daily with a meal.   . mirabegron ER (MYRBETRIQ) 25 MG TB24 tablet Take 25 mg by mouth at bedtime.  . montelukast (SINGULAIR) 10 MG tablet Take 1 tablet (10 mg total) by mouth at bedtime.  . NON FORMULARY Diet:  Consistent Carbohydrate,  . omeprazole (PRILOSEC) 20 MG capsule Take 20 mg by mouth daily. Special Instructions: TAKE 1 CAPSULE BY MOUTH EVERY MORNING *TAKE ON AN EMPTY STOMACH* *DO NOT CRUSH* (FORMULARY SUB FOR  . polyethylene glycol (MIRALAX / GLYCOLAX) packet Take 17 g by mouth daily.  Marland Kitchen senna-docusate (SENOKOT-S) 8.6-50 MG tablet Take 1 tablet by mouth at  bedtime.   . simvastatin (ZOCOR) 10 MG tablet Take 10 mg by mouth at bedtime.   . triamcinolone (NASACORT) 55 MCG/ACT AERO nasal inhaler Place 1 spray into the nose daily.  . valACYclovir (VALTREX) 500 MG tablet Take 500 mg by mouth daily.    No facility-administered encounter medications on file as of 04/21/2021.     SIGNIFICANT DIAGNOSTIC EXAMS   PREVIOUS  12-18-19: chest x-ray: Mild right upper lobe and bibasilar airspace opacities which could reflect early pneumonia (including atypical/viral infection).  12-17-18; ct of abdomen and pelvis: Slight edema of the mucosa  of the distal antrum of the stomach. This could represent gastritis. Otherwise, benign-appearing abdomen and pelvis.  07-16-20 DEXA: t score 0.825   NO NEW EXAMS.   LABS REVIEWED PREVIOUS;    05-14-20: wbc 8.8; hgb 12.4; hct 39.7; mcv 88.4 plt 199; hgb a1c 6.8 vit B 12: 154  08-27-20: hepatitis C: neg 08-31-20: urine culture: multiple species 09-21-20: urine culture: e-coli: cipro  10-01-20: wbc 14.8; hgb 13.5; hct 43.3; mcv 87.7 plt 240; glucose 146; bun 19; creat 1.05; k+ 4.4; na++ 136; ca 9.7 vitamin B 12: 877; folate 13.9; tsh 4.543 11-16-20: wbc 11.7; hgb 12.7; hct 40.9; mcv 887 plt 213 urine culture: klebsiella pneumoniae: cipro 12-19-20: urine culture: e-coli 12-30-20: hgb a1c 6.9  02-26-21: urine culture: 80,000 e-coli 03-18-21: hgb a1c 7.5 chol 169; ldl 70; trig 222; hdl 55  TODAY  04-05-21:wbc 8.6; hgb 11.5; hct 37.2; mcv 87.7 plt 184; glucose 180; bun 18; creat 0.85; k+ 3.7; na++ 135; ca 8.9; GFR>60; liver normal albumin 3.6 04-08-21: glucose 148; bun 22; creat 0.89; k+ 3.7; na++ 137; ca 8.8; GFR >60; urine micro-albumin <0.3    Review of Systems  Constitutional: Negative for malaise/fatigue.  Respiratory: Negative for cough and shortness of breath.   Cardiovascular: Negative for chest pain, palpitations and leg swelling.  Gastrointestinal: Negative for abdominal pain, constipation and heartburn.  Musculoskeletal:  Negative for back pain, joint pain and myalgias.  Skin: Negative.   Neurological: Negative for dizziness.  Psychiatric/Behavioral: The patient is not nervous/anxious.     Physical Exam Constitutional:      General: She is not in acute distress.    Appearance: She is well-developed. She is not diaphoretic.  Neck:     Thyroid: No thyromegaly.  Cardiovascular:     Rate and Rhythm: Normal rate and regular rhythm.     Pulses: Normal pulses.     Heart sounds: Normal heart sounds.  Pulmonary:     Effort: Pulmonary effort is normal. No respiratory distress.     Breath sounds: Normal breath sounds.  Abdominal:     General: Bowel sounds are normal. There is no distension.     Palpations: Abdomen is soft.     Tenderness: There is no abdominal tenderness.  Musculoskeletal:        General: Normal range of motion.     Cervical back: Neck supple.     Right lower leg: No edema.     Left lower leg: No edema.  Lymphadenopathy:     Cervical: No cervical adenopathy.  Skin:    General: Skin is warm and dry.  Neurological:     Mental Status: She is alert. Mental status is at baseline.  Psychiatric:        Mood and Affect: Mood normal.      ASSESSMENT/ PLAN:  TODAY  1. Aortic atherosclerosis: (ct 12-18-19); will monitor   2. 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 Gastroesophageal reflux disease without esophagitis: is stable will continue prilosec 20 mg daily    PREVIOUS   4. OAB is stable will continue myrbetriq 25 mg daily   5. Chronic generalized pain: is stable will continue tylenol cr 650 mg every 6 hours voltaren gel 2 gm to both knees twice daily as needed  cymbalta 20 mg twice daily   6. Chronic non-seasonal allergic rhinitis: is stable will continue claritin  10 mg daily; nasocort daily  and singulair 10 mg daily   7. Herpes: is stable no recent  outbreaks will continue valtrex 500 mg daily   8. Vitamin B12: deficiency is stable level 877 will continue 1000 mcg daily   9. Chronic constipation: is stable will continue miralax daily and senna s daily   10. Major depression recurrent chronic: is stable will continue cymbalta 20 mg twice daily lamictal 25 mg twice daily for mood and melatonin 5 mg nightly   11. Hypertension associated with type 2 diabetes mellitus: is stable b/p 158/67 will continue norvasc 10 mg daily asa 81 mg daily   12. Dyslipidemia associated with type 2 diabetes mellitus is stable LDL 70 will continue zocor 10 mg daily    Synthia Innocent NP [facility]  Contact [phone] Monday through Friday 8am- 5pm  After hours call [phone]

## 2021-04-26 DIAGNOSIS — E1151 Type 2 diabetes mellitus with diabetic peripheral angiopathy without gangrene: Secondary | ICD-10-CM | POA: Insufficient documentation

## 2021-05-11 ENCOUNTER — Non-Acute Institutional Stay (SKILLED_NURSING_FACILITY): Payer: Medicare Other | Admitting: Adult Health

## 2021-05-11 ENCOUNTER — Encounter: Payer: Self-pay | Admitting: Adult Health

## 2021-05-11 DIAGNOSIS — N3281 Overactive bladder: Secondary | ICD-10-CM | POA: Diagnosis not present

## 2021-05-11 NOTE — Progress Notes (Signed)
Location:  [facility] Room Number: 115-W Place of Service:  SNF (31)   CODE STATUS: Full Code   Allergies  Allergen Reactions  . Lisinopril   . Penicillins Nausea Only    Has patient had a PCN reaction causing immediate rash, facial/tongue/throat swelling, SOB or lightheadedness with hypotension: Yes Has patient had a PCN reaction causing severe rash involving mucus membranes or skin necrosis: No Has patient had a PCN reaction that required hospitalization No Has patient had a PCN reaction occurring within the last 10 years: No If all of the above answers are "NO", then may proceed with Cephalosporin use.   . Sulfa Antibiotics Nausea And Vomiting    Chief Complaint  Patient presents with  . Acute Visit    Urinary frequency     HPI:  She has been started on myrbetriq 25 mg nightly to help with frequent nocturia. She states that she is still getting out of bed at least 4-5 times nightly. This is affecting her sleep and quality of life. She denies any flank pain; no foul odor; no bladder pain or dysuria.   Past Medical History:  Diagnosis Date  . Anxiety   . Bowel obstruction (HCC)   . Burning with urination 01/04/2016  . Diabetes mellitus without complication (HCC)   . Genital herpes   . GERD (gastroesophageal reflux disease)   . Hematuria 01/04/2016  . Herpes 12/22/2015  . Hyperlipidemia   . Hypertension   . LLQ abdominal tenderness 01/04/2016  . Neuropathy   . Pelvic pressure in female 01/04/2016  . Recurrent UTI   . Sciatic nerve disease   . Small bowel obstruction (HCC) 04/27/2016  . Urinary frequency 01/04/2016  . Urticaria     Past Surgical History:  Procedure Laterality Date  . ABDOMINAL HYSTERECTOMY    . APPLICATION OF INTRAOPERATIVE CT SCAN N/A 12/19/2018   Procedure: APPLICATION OF INTRAOPERATIVE CT SCAN;  Surgeon: Maeola Harman, MD;  Location: [facility] OR;  Service: Neurosurgery;  Laterality: N/A;  . bowel obstruction    . COLON SURGERY      blocked colon  . POSTERIOR CERVICAL FUSION/FORAMINOTOMY N/A 12/19/2018   Procedure: Cervical Seven to Thoracic One Posterior cervical fusion;  Surgeon: Maeola Harman, MD;  Location: [facility] OR;  Service: Neurosurgery;  Laterality: N/A;  Cervical Seven to Thoracic One Posterior cervical fusion    Social History   Socioeconomic History  . Marital status: Widowed    Spouse name: Not on file  . Number of children: Not on file  . Years of education: Not on file  . Highest education level: Not on file  Occupational History  . Occupation: retired   Tobacco Use  . Smoking status: Never Smoker  . Smokeless tobacco: Current User    Types: Snuff  Vaping Use  . Vaping Use: Never used  Substance and Sexual Activity  . Alcohol use: No  . Drug use: No  . Sexual activity: Not Currently    Birth control/protection: Surgical    Comment: hyst  Other Topics Concern  . Not on file  Social History Narrative   Long term resident of [facility]    Social Determinants of Health   Financial Resource Strain: Not on file  Food Insecurity: Not on file  Transportation Needs: Not on file  Physical Activity: Not on file  Stress: Not on file  Social Connections: Not on file  Intimate Partner Violence: Not on file   Family History  Problem Relation Age of Onset  .  Diabetes Mother   . Heart disease Brother   . Hypertension Brother   . Diabetes Brother   . Hypertension Daughter   . Diabetes Brother   . Diabetes Brother   . Alzheimer's disease Brother   . Anxiety disorder Daughter       VITAL SIGNS BP (!) 137/56   Pulse 76   Temp 98.5 F (36.9 C)   Resp 18   Ht 5' (1.524 m)   Wt 191 lb (86.6 kg)   SpO2 98%   BMI 37.30 kg/m   Outpatient Encounter Medications as of 05/11/2021  Medication Sig  . acetaminophen (TYLENOL) 650 MG CR tablet Take 650 mg by mouth every 6 (six) hours.  Marland Kitchen amLODipine (NORVASC) 10 MG tablet Take 1 tablet (10 mg total) by mouth daily.  Marland Kitchen aspirin 81 MG chewable tablet Chew 81  mg by mouth daily.  Lucilla Lame [place]-Castor Oil (VENELEX) OINT Apply topically. apply to residents buttocks q shift PRN Every Shift - PRN  . cyanocobalamin 1000 MCG tablet Take 1,000 mcg by mouth daily. Special Instructions: for vit B 12 deficiency  . diclofenac Sodium (VOLTAREN) 1 % GEL Apply 2 g topically 2 (two) times daily as needed.  . DULoxetine (CYMBALTA) 20 MG capsule Take 20 mg by mouth 2 (two) times daily.  . Emollient (EUCERIN) lotion Apply topically 4 (four) times daily as needed for dry skin.  Marland Kitchen gabapentin (NEURONTIN) 100 MG capsule Take 100 mg by mouth at bedtime.  . lamoTRIgine (LAMICTAL) 25 MG tablet Take 25 mg by mouth 2 (two) times daily.   Marland Kitchen linagliptin (TRADJENTA) 5 MG TABS tablet Take 5 mg by mouth daily.  Marland Kitchen loratadine (CLARITIN) 10 MG tablet Take 10 mg by mouth daily.  Marland Kitchen losartan (COZAAR) 25 MG tablet Take 12.5 mg by mouth daily.  . Melatonin 5 MG TABS Take 5 mg by mouth at bedtime.  . Menthol, Topical Analgesic, 4 % GEL Apply 1 application topically daily as needed (FOR PAIN).   Marland Kitchen metFORMIN (GLUCOPHAGE) 1000 MG tablet Take 1,000 mg by mouth 2 (two) times daily with a meal.   . mirabegron ER (MYRBETRIQ) 50 MG TB24 tablet Take 50 mg by mouth daily.  . montelukast (SINGULAIR) 10 MG tablet Take 1 tablet (10 mg total) by mouth at bedtime.  . NON FORMULARY Diet:  Consistent Carbohydrate,  . omeprazole (PRILOSEC) 20 MG capsule Take 20 mg by mouth daily. Special Instructions: TAKE 1 CAPSULE BY MOUTH EVERY MORNING *TAKE ON AN EMPTY STOMACH* *DO NOT CRUSH* (FORMULARY SUB FOR  . polyethylene glycol (MIRALAX / GLYCOLAX) packet Take 17 g by mouth daily.  Marland Kitchen senna-docusate (SENOKOT-S) 8.6-50 MG tablet Take 1 tablet by mouth at bedtime.   . simvastatin (ZOCOR) 10 MG tablet Take 10 mg by mouth at bedtime.   . triamcinolone (NASACORT) 55 MCG/ACT AERO nasal inhaler Place 1 spray into the nose daily.  . valACYclovir (VALTREX) 500 MG tablet Take 500 mg by mouth daily.   . [DISCONTINUED]  mirabegron ER (MYRBETRIQ) 25 MG TB24 tablet Take 25 mg by mouth at bedtime.   No facility-administered encounter medications on file as of 05/11/2021.     SIGNIFICANT DIAGNOSTIC EXAMS   PREVIOUS  12-18-19: chest x-ray: Mild right upper lobe and bibasilar airspace opacities which could reflect early pneumonia (including atypical/viral infection).  12-17-18; ct of abdomen and pelvis: Slight edema of the mucosa of the distal antrum of the stomach. This could represent gastritis. Otherwise, benign-appearing abdomen and pelvis.  07-16-20 DEXA: t  score 0.825   NO NEW EXAMS.   LABS REVIEWED PREVIOUS;    05-14-20: wbc 8.8; hgb 12.4; hct 39.7; mcv 88.4 plt 199; hgb a1c 6.8 vit B 12: 154  08-27-20: hepatitis C: neg 08-31-20: urine culture: multiple species 09-21-20: urine culture: e-coli: cipro  10-01-20: wbc 14.8; hgb 13.5; hct 43.3; mcv 87.7 plt 240; glucose 146; bun 19; creat 1.05; k+ 4.4; na++ 136; ca 9.7 vitamin B 12: 877; folate 13.9; tsh 4.543 11-16-20: wbc 11.7; hgb 12.7; hct 40.9; mcv 887 plt 213 urine culture: klebsiella pneumoniae: cipro 12-19-20: urine culture: e-coli 12-30-20: hgb a1c 6.9  02-26-21: urine culture: 80,000 e-coli 03-18-21: hgb a1c 7.5 chol 169; ldl 70; trig 222; hdl 55 04-05-21:wbc 8.6; hgb 11.5; hct 37.2; mcv 87.7 plt 184; glucose 180; bun 18; creat 0.85; k+ 3.7; na++ 135; ca 8.9; GFR>60; liver normal albumin 3.6 04-08-21: glucose 148; bun 22; creat 0.89; k+ 3.7; na++ 137; ca 8.8; GFR >60; urine micro-albumin <0.3   NO NEW LABS.   Review of Systems  Constitutional: Negative for malaise/fatigue.  Respiratory: Negative for cough and shortness of breath.   Cardiovascular: Negative for chest pain, palpitations and leg swelling.  Gastrointestinal: Negative for abdominal pain, constipation and heartburn.  Genitourinary: Positive for frequency and urgency. Negative for dysuria, flank pain and hematuria.  Musculoskeletal: Negative for back pain, joint pain and myalgias.  Skin:  Negative.   Neurological: Negative for dizziness.  Psychiatric/Behavioral: The patient is not nervous/anxious.     Physical Exam Constitutional:      General: She is not in acute distress.    Appearance: She is well-developed. She is not diaphoretic.  Neck:     Thyroid: No thyromegaly.  Cardiovascular:     Rate and Rhythm: Normal rate and regular rhythm.     Pulses: Normal pulses.     Heart sounds: Normal heart sounds.  Pulmonary:     Effort: Pulmonary effort is normal. No respiratory distress.     Breath sounds: Normal breath sounds.  Abdominal:     General: Bowel sounds are normal. There is no distension.     Palpations: Abdomen is soft.     Tenderness: There is no abdominal tenderness.  Musculoskeletal:        General: Normal range of motion.     Cervical back: Neck supple.     Right lower leg: No edema.     Left lower leg: No edema.  Lymphadenopathy:     Cervical: No cervical adenopathy.  Skin:    General: Skin is warm and dry.  Neurological:     Mental Status: She is alert. Mental status is at baseline.  Psychiatric:        Mood and Affect: Mood normal.       ASSESSMENT/ PLAN:  TODAY  1. OAB (over active bladder); is worse: will increase to myrbetriq 50 mg nightly and will monitor her status.    Synthia Innocent NP [facility]  Contact [phone] Monday through Friday 8am- 5pm  After hours call [phone]

## 2021-05-31 ENCOUNTER — Encounter: Payer: Self-pay | Admitting: Adult Health

## 2021-05-31 ENCOUNTER — Non-Acute Institutional Stay (SKILLED_NURSING_FACILITY): Payer: Medicare Other | Admitting: Adult Health

## 2021-05-31 DIAGNOSIS — G8929 Other chronic pain: Secondary | ICD-10-CM | POA: Diagnosis not present

## 2021-05-31 DIAGNOSIS — N3281 Overactive bladder: Secondary | ICD-10-CM | POA: Diagnosis not present

## 2021-05-31 DIAGNOSIS — J3089 Other allergic rhinitis: Secondary | ICD-10-CM | POA: Diagnosis not present

## 2021-05-31 DIAGNOSIS — R52 Pain, unspecified: Secondary | ICD-10-CM

## 2021-05-31 NOTE — Progress Notes (Signed)
Location:  [facility] Room Number: 115-W Place of Service:  SNF (31)   CODE STATUS: Full  Allergies  Allergen Reactions  . Lisinopril   . Penicillins Nausea Only    Has patient had a PCN reaction causing immediate rash, facial/tongue/throat swelling, SOB or lightheadedness with hypotension: Yes Has patient had a PCN reaction causing severe rash involving mucus membranes or skin necrosis: No Has patient had a PCN reaction that required hospitalization No Has patient had a PCN reaction occurring within the last 10 years: No If all of the above answers are "NO", then may proceed with Cephalosporin use.   . Sulfa Antibiotics Nausea And Vomiting   Chief Complaint  Patient presents with  . Medical Management of Chronic Issues         OAB: Chronic generalized pain:   Chronic non-seasonal allergic rhinitis      HPI:  She is a 80 year old long term resident of this facility being seen for the management of her chronic illnesses; OAB: Chronic generalized pain:   Chronic non-seasonal allergic rhinitis. There are no reports of uncontrolled pain. She denies getting up all night to urinate after increasing myrbetriq to 50 mg daily. There are no reports of changes in appetite.   Past Medical History:  Diagnosis Date  . Anxiety   . Bowel obstruction (HCC)   . Burning with urination 01/04/2016  . Diabetes mellitus without complication (HCC)   . Genital herpes   . GERD (gastroesophageal reflux disease)   . Hematuria 01/04/2016  . Herpes 12/22/2015  . Hyperlipidemia   . Hypertension   . LLQ abdominal tenderness 01/04/2016  . Neuropathy   . Pelvic pressure in female 01/04/2016  . Recurrent UTI   . Sciatic nerve disease   . Small bowel obstruction (HCC) 04/27/2016  . Urinary frequency 01/04/2016  . Urticaria     Past Surgical History:  Procedure Laterality Date  . ABDOMINAL HYSTERECTOMY    . APPLICATION OF INTRAOPERATIVE CT SCAN N/A 12/19/2018   Procedure:  APPLICATION OF INTRAOPERATIVE CT SCAN;  Surgeon: Maeola HarmanStern, Joseph, MD;  [facility];  Service: Neurosurgery;  Laterality: N/A;  . bowel obstruction    . COLON SURGERY     blocked colon  . POSTERIOR CERVICAL FUSION/FORAMINOTOMY N/A 12/19/2018   Procedure: Cervical Seven to Thoracic One Posterior cervical fusion;  Surgeon: Maeola HarmanStern, Joseph, MD;  [facility];  Service: Neurosurgery;  Laterality: N/A;  Cervical Seven to Thoracic One Posterior cervical fusion    Social History   Socioeconomic History  . Marital status: Widowed    Spouse name: Not on file  . Number of children: Not on file  . Years of education: Not on file  . Highest education level: Not on file  Occupational History  . Occupation: retired   Tobacco Use  . Smoking status: Never  . Smokeless tobacco: Current    Types: Snuff  Vaping Use  . Vaping Use: Never used  Substance and Sexual Activity  . Alcohol use: No  . Drug use: No  . Sexual activity: Not Currently    Birth control/protection: Surgical    Comment: hyst  Other Topics Concern  . Not on file  Social History Narrative   [facility]    Social Determinants of Health   Financial Resource Strain: Not on file  Food Insecurity: Not on file  Transportation Needs: Not on file  Physical Activity: Not on file  Stress: Not on file  Social Connections: Not on file  Intimate Partner Violence: Not on file   Family History  Problem Relation Age of Onset  . Diabetes Mother   . Heart disease Brother   . Hypertension Brother   . Diabetes Brother   . Hypertension Daughter   . Diabetes Brother   . Diabetes Brother   . Alzheimer's disease Brother   . Anxiety disorder Daughter       VITAL SIGNS BP 136/73   Pulse 76   Temp 98.4 F (36.9 C)   Ht 5' (1.524 m)   Wt 191 lb 12.8 oz (87 kg)   SpO2 98%   BMI 37.46 kg/m   Outpatient Encounter Medications as of 05/31/2021  Medication Sig  . acetaminophen (TYLENOL) 650 MG CR tablet Take 650 mg by  mouth every 6 (six) hours.  Marland Kitchen amLODipine (NORVASC) 10 MG tablet Take 1 tablet (10 mg total) by mouth daily.  Marland Kitchen aspirin 81 MG chewable tablet Chew 81 mg by mouth daily.  . cyanocobalamin 1000 MCG tablet Take 1,000 mcg by mouth daily. Special Instructions: for vit B 12 deficiency  . diclofenac Sodium (VOLTAREN) 1 % GEL Apply 2 g topically 2 (two) times daily as needed.  . DULoxetine (CYMBALTA) 20 MG capsule Take 20 mg by mouth 2 (two) times daily.  Marland Kitchen gabapentin (NEURONTIN) 100 MG capsule Take 100 mg by mouth at bedtime.  . lamoTRIgine (LAMICTAL) 25 MG tablet Take 25 mg by mouth 2 (two) times daily.   Marland Kitchen linagliptin (TRADJENTA) 5 MG TABS tablet Take 5 mg by mouth daily.  Marland Kitchen loratadine (CLARITIN) 10 MG tablet Take 10 mg by mouth daily.  Marland Kitchen losartan (COZAAR) 25 MG tablet Take 12.5 mg by mouth daily.  . Melatonin 5 MG TABS Take 5 mg by mouth at bedtime.  . metFORMIN (GLUCOPHAGE) 1000 MG tablet Take 1,000 mg by mouth 2 (two) times daily with a meal.   . mirabegron ER (MYRBETRIQ) 50 MG TB24 tablet Take 50 mg by mouth daily.  . montelukast (SINGULAIR) 10 MG tablet Take 1 tablet (10 mg total) by mouth at bedtime.  . NON FORMULARY Diet:  Consistent Carbohydrate,  . omeprazole (PRILOSEC) 20 MG capsule Take 20 mg by mouth daily. Special Instructions: TAKE 1 CAPSULE BY MOUTH EVERY MORNING *TAKE ON AN EMPTY STOMACH* *DO NOT CRUSH* (FORMULARY SUB FOR  . polyethylene glycol (MIRALAX / GLYCOLAX) packet Take 17 g by mouth daily.  Marland Kitchen senna-docusate (SENOKOT-S) 8.6-50 MG tablet Take 1 tablet by mouth at bedtime.   . simvastatin (ZOCOR) 10 MG tablet Take 10 mg by mouth at bedtime.   . triamcinolone (NASACORT) 55 MCG/ACT AERO nasal inhaler Place 1 spray into the nose daily.  . valACYclovir (VALTREX) 500 MG tablet Take 500 mg by mouth daily.   . [DISCONTINUED] Balsam [place]-Castor Oil (VENELEX) OINT Apply topically. apply to residents buttocks q shift PRN Every Shift - PRN  . [DISCONTINUED] Emollient (EUCERIN) lotion  Apply topically 4 (four) times daily as needed for dry skin.  . [DISCONTINUED] Menthol, Topical Analgesic, 4 % GEL Apply 1 application topically daily as needed (FOR PAIN).    No facility-administered encounter medications on file as of 05/31/2021.     SIGNIFICANT DIAGNOSTIC EXAMS  PREVIOUS  12-18-19: chest x-ray: Mild right upper lobe and bibasilar airspace opacities which could reflect early pneumonia (including atypical/viral infection).  12-17-18; ct of abdomen and pelvis: Slight edema of the mucosa of the distal antrum of the stomach. This could represent gastritis. Otherwise, benign-appearing abdomen  and pelvis.  07-16-20 DEXA: t score 0.825   NO NEW EXAMS.   LABS REVIEWED PREVIOUS;    05-14-20: wbc 8.8; hgb 12.4; hct 39.7; mcv 88.4 plt 199; hgb a1c 6.8 vit B 12: 154  08-27-20: hepatitis C: neg 08-31-20: urine culture: multiple species 09-21-20: urine culture: e-coli: cipro  10-01-20: wbc 14.8; hgb 13.5; hct 43.3; mcv 87.7 plt 240; glucose 146; bun 19; creat 1.05; k+ 4.4; na++ 136; ca 9.7 vitamin B 12: 877; folate 13.9; tsh 4.543 11-16-20: wbc 11.7; hgb 12.7; hct 40.9; mcv 887 plt 213 urine culture: klebsiella pneumoniae: cipro 12-19-20: urine culture: e-coli 12-30-20: hgb a1c 6.9  02-26-21: urine culture: 80,000 e-coli 03-18-21: hgb a1c 7.5 chol 169; ldl 70; trig 222; hdl 55 04-05-21:wbc 8.6; hgb 11.5; hct 37.2; mcv 87.7 plt 184; glucose 180; bun 18; creat 0.85; k+ 3.7; na++ 135; ca 8.9; GFR>60; liver normal albumin 3.6 04-08-21: glucose 148; bun 22; creat 0.89; k+ 3.7; na++ 137; ca 8.8; GFR >60; urine micro-albumin <0.3   NO NEW LABS.    Review of Systems  Constitutional:  Negative for malaise/fatigue.  Respiratory:  Negative for cough and shortness of breath.   Cardiovascular:  Negative for chest pain, palpitations and leg swelling.  Gastrointestinal:  Negative for abdominal pain, constipation and heartburn.  Musculoskeletal:  Negative for back pain, joint pain and myalgias.  Skin:  Negative.   Neurological:  Negative for dizziness.  Psychiatric/Behavioral:  The patient is not nervous/anxious.    Physical Exam Constitutional:      General: She is not in acute distress.    Appearance: She is well-developed. She is not diaphoretic.  Neck:     Thyroid: No thyromegaly.  Cardiovascular:     Rate and Rhythm: Normal rate and regular rhythm.     Pulses: Normal pulses.     Heart sounds: Normal heart sounds.  Pulmonary:     Effort: Pulmonary effort is normal. No respiratory distress.     Breath sounds: Normal breath sounds.  Abdominal:     General: Bowel sounds are normal. There is no distension.     Palpations: Abdomen is soft.     Tenderness: There is no abdominal tenderness.  Musculoskeletal:        General: Normal range of motion.     Cervical back: Neck supple.     Right lower leg: No edema.     Left lower leg: No edema.  Lymphadenopathy:     Cervical: No cervical adenopathy.  Skin:    General: Skin is warm and dry.  Neurological:     Mental Status: She is alert. Mental status is at baseline.  Psychiatric:        Mood and Affect: Mood normal.     ASSESSMENT/ PLAN:  TODAY  OAB: is stable will continue myrbetrig 50 mg daily   2. Chronic generalized pain: is stable will continue tylenol cr 650 mg every 6 hours voltaren gel 2 gm to both knees twice daily as needed   3. Chronic non-seasonal allergic rhinitis is stable will continue claritin 10 mg daily nasocort daily and singulair 10 mg daily   PREVIOUS   4. Herpes: is stable no recent outbreaks will continue valtrex 500 mg daily   5. Vitamin B12: deficiency is stable level 877 will continue 1000 mcg daily   6. Chronic constipation: is stable will continue miralax daily and senna s daily   7. Major depression recurrent chronic: is stable will continue cymbalta 20 mg twice daily lamictal  25 mg twice daily for mood and melatonin 5 mg nightly   8. Hypertension associated with type 2 diabetes mellitus:  is stable b/p 158/67 will continue norvasc 10 mg daily asa 81 mg daily   9. Dyslipidemia associated with type 2 diabetes mellitus is stable LDL 70 will continue zocor 10 mg daily   10. Aortic atherosclerosis: (ct 12-18-19); will monitor   11. 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 Gastroesophageal reflux disease without esophagitis: is stable will continue prilosec 20 mg daily      Synthia Innocent NP [facility]  Contact [phone] Monday through Friday 8am- 5pm  After hours call [phone]

## 2021-06-22 ENCOUNTER — Non-Acute Institutional Stay (SKILLED_NURSING_FACILITY): Payer: Medicare Other | Admitting: Adult Health

## 2021-06-22 ENCOUNTER — Encounter: Payer: Self-pay | Admitting: Adult Health

## 2021-06-22 DIAGNOSIS — E538 Deficiency of other specified B group vitamins: Secondary | ICD-10-CM

## 2021-06-22 DIAGNOSIS — F339 Major depressive disorder, recurrent, unspecified: Secondary | ICD-10-CM

## 2021-06-22 DIAGNOSIS — B009 Herpesviral infection, unspecified: Secondary | ICD-10-CM | POA: Diagnosis not present

## 2021-06-22 DIAGNOSIS — K5909 Other constipation: Secondary | ICD-10-CM

## 2021-06-22 NOTE — Progress Notes (Signed)
Location:  [facility] Room Number: 115 Place of Service:  SNF (31)   CODE STATUS: full code   Allergies  Allergen Reactions  . Lisinopril   . Penicillins Nausea Only    Has patient had a PCN reaction causing immediate rash, facial/tongue/throat swelling, SOB or lightheadedness with hypotension: Yes Has patient had a PCN reaction causing severe rash involving mucus membranes or skin necrosis: No Has patient had a PCN reaction that required hospitalization No Has patient had a PCN reaction occurring within the last 10 years: No If all of the above answers are "NO", then may proceed with Cephalosporin use.   . Sulfa Antibiotics Nausea And Vomiting    Chief Complaint  Patient presents with  . Medical Management of Chronic Issues         Herpes:   Vitamin B 12 deficiency:    Chronic constipation:    Major depression recurrent chronic    HPI:  She is a 80 year old long term resident of this facility being seen for the management of her chronic illnesses:  Herpes:   Vitamin B 12 deficiency:    Chronic constipation:    Major depression recurrent chronic. There are no reports of uncontrolled pain; no changes in appetite; no constipation; no anxiety or depressive thoughts.   Past Medical History:  Diagnosis Date  . Anxiety   . Bowel obstruction (HCC)   . Burning with urination 01/04/2016  . Diabetes mellitus without complication (HCC)   . Genital herpes   . GERD (gastroesophageal reflux disease)   . Hematuria 01/04/2016  . Herpes 12/22/2015  . Hyperlipidemia   . Hypertension   . LLQ abdominal tenderness 01/04/2016  . Neuropathy   . Pelvic pressure in female 01/04/2016  . Recurrent UTI   . Sciatic nerve disease   . Small bowel obstruction (HCC) 04/27/2016  . Urinary frequency 01/04/2016  . Urticaria     Past Surgical History:  Procedure Laterality Date  . ABDOMINAL HYSTERECTOMY    . APPLICATION OF INTRAOPERATIVE CT SCAN N/A 12/19/2018   Procedure:  APPLICATION OF INTRAOPERATIVE CT SCAN;  Surgeon: Maeola Harman, MD;  Location: [facility] OR;  Service: Neurosurgery;  Laterality: N/A;  . bowel obstruction    . COLON SURGERY     blocked colon  . POSTERIOR CERVICAL FUSION/FORAMINOTOMY N/A 12/19/2018   Procedure: Cervical Seven to Thoracic One Posterior cervical fusion;  Surgeon: Maeola Harman, MD;  Location: [facility] OR;  Service: Neurosurgery;  Laterality: N/A;  Cervical Seven to Thoracic One Posterior cervical fusion    Social History   Socioeconomic History  . Marital status: Widowed    Spouse name: Not on file  . Number of children: Not on file  . Years of education: Not on file  . Highest education level: Not on file  Occupational History  . Occupation: retired   Tobacco Use  . Smoking status: Never  . Smokeless tobacco: Current    Types: Snuff  Vaping Use  . Vaping Use: Never used  Substance and Sexual Activity  . Alcohol use: No  . Drug use: No  . Sexual activity: Not Currently    Birth control/protection: Surgical    Comment: hyst  Other Topics Concern  . Not on file  Social History Narrative   Long term resident of [facility]    Social Determinants of Health   Financial Resource Strain: Not on file  Food Insecurity: Not on file  Transportation Needs: Not on file  Physical Activity:  Not on file  Stress: Not on file  Social Connections: Not on file  Intimate Partner Violence: Not on file   Family History  Problem Relation Age of Onset  . Diabetes Mother   . Heart disease Brother   . Hypertension Brother   . Diabetes Brother   . Hypertension Daughter   . Diabetes Brother   . Diabetes Brother   . Alzheimer's disease Brother   . Anxiety disorder Daughter       VITAL SIGNS BP (!) 148/77   Pulse 64   Temp (!) 97.4 F (36.3 C)   Resp 18   Ht 5' (1.524 m)   Wt 192 lb 3.2 oz (87.2 kg)   BMI 37.54 kg/m   Outpatient Encounter Medications as of 06/22/2021  Medication Sig  . acetaminophen (TYLENOL) 650 MG CR tablet Take  650 mg by mouth every 6 (six) hours.  Marland Kitchen. amLODipine (NORVASC) 10 MG tablet Take 1 tablet (10 mg total) by mouth daily.  Marland Kitchen. aspirin 81 MG chewable tablet Chew 81 mg by mouth daily.  . cyanocobalamin 1000 MCG tablet Take 1,000 mcg by mouth daily. Special Instructions: for vit B 12 deficiency  . diclofenac Sodium (VOLTAREN) 1 % GEL Apply 2 g topically 2 (two) times daily as needed.  . DULoxetine (CYMBALTA) 20 MG capsule Take 20 mg by mouth 2 (two) times daily.  Marland Kitchen. gabapentin (NEURONTIN) 100 MG capsule Take 100 mg by mouth at bedtime.  . lamoTRIgine (LAMICTAL) 25 MG tablet Take 25 mg by mouth 2 (two) times daily.   Marland Kitchen. linagliptin (TRADJENTA) 5 MG TABS tablet Take 5 mg by mouth daily.  Marland Kitchen. loratadine (CLARITIN) 10 MG tablet Take 10 mg by mouth daily.  Marland Kitchen. losartan (COZAAR) 25 MG tablet Take 12.5 mg by mouth daily.  . Melatonin 5 MG TABS Take 5 mg by mouth at bedtime.  . metFORMIN (GLUCOPHAGE) 1000 MG tablet Take 1,000 mg by mouth 2 (two) times daily with a meal.   . mirabegron ER (MYRBETRIQ) 50 MG TB24 tablet Take 50 mg by mouth daily.  . montelukast (SINGULAIR) 10 MG tablet Take 1 tablet (10 mg total) by mouth at bedtime.  . NON FORMULARY Diet:  Consistent Carbohydrate,  . omeprazole (PRILOSEC) 20 MG capsule Take 20 mg by mouth daily. Special Instructions: TAKE 1 CAPSULE BY MOUTH EVERY MORNING *TAKE ON AN EMPTY STOMACH* *DO NOT CRUSH* (FORMULARY SUB FOR  . polyethylene glycol (MIRALAX / GLYCOLAX) packet Take 17 g by mouth daily.  Marland Kitchen. senna-docusate (SENOKOT-S) 8.6-50 MG tablet Take 1 tablet by mouth at bedtime.   . simvastatin (ZOCOR) 10 MG tablet Take 10 mg by mouth at bedtime.   . triamcinolone (NASACORT) 55 MCG/ACT AERO nasal inhaler Place 1 spray into the nose daily.  . valACYclovir (VALTREX) 500 MG tablet Take 500 mg by mouth daily.    No facility-administered encounter medications on file as of 06/22/2021.     SIGNIFICANT DIAGNOSTIC EXAMS  PREVIOUS  12-18-19: chest x-ray: Mild right upper lobe  and bibasilar airspace opacities which could reflect early pneumonia (including atypical/viral infection).  12-17-18; ct of abdomen and pelvis: Slight edema of the mucosa of the distal antrum of the stomach. This could represent gastritis. Otherwise, benign-appearing abdomen and pelvis.  07-16-20 DEXA: t score 0.825   NO NEW EXAMS.   LABS REVIEWED PREVIOUS;     08-27-20: hepatitis C: neg 08-31-20: urine culture: multiple species 09-21-20: urine culture: e-coli: cipro  10-01-20: wbc 14.8; hgb 13.5; hct 43.3; mcv 87.7  plt 240; glucose 146; bun 19; creat 1.05; k+ 4.4; na++ 136; ca 9.7 vitamin B 12: 877; folate 13.9; tsh 4.543 11-16-20: wbc 11.7; hgb 12.7; hct 40.9; mcv 887 plt 213 urine culture: klebsiella pneumoniae: cipro 12-19-20: urine culture: e-coli 12-30-20: hgb a1c 6.9  02-26-21: urine culture: 80,000 e-coli 03-18-21: hgb a1c 7.5 chol 169; ldl 70; trig 222; hdl 55 04-05-21:wbc 8.6; hgb 11.5; hct 37.2; mcv 87.7 plt 184; glucose 180; bun 18; creat 0.85; k+ 3.7; na++ 135; ca 8.9; GFR>60; liver normal albumin 3.6 04-08-21: glucose 148; bun 22; creat 0.89; k+ 3.7; na++ 137; ca 8.8; GFR >60; urine micro-albumin <0.3   NO NEW LABS.    Review of Systems  Constitutional:  Negative for malaise/fatigue.  Respiratory:  Negative for cough and shortness of breath.   Cardiovascular:  Negative for chest pain, palpitations and leg swelling.  Gastrointestinal:  Negative for abdominal pain, constipation and heartburn.  Musculoskeletal:  Negative for back pain, joint pain and myalgias.  Skin: Negative.   Neurological:  Negative for dizziness.  Psychiatric/Behavioral:  The patient is not nervous/anxious.    Physical Exam Constitutional:      General: She is not in acute distress.    Appearance: She is well-developed. She is obese. She is not diaphoretic.  Neck:     Thyroid: No thyromegaly.  Cardiovascular:     Rate and Rhythm: Normal rate and regular rhythm.     Pulses: Normal pulses.     Heart sounds:  Normal heart sounds.  Pulmonary:     Effort: Pulmonary effort is normal. No respiratory distress.     Breath sounds: Normal breath sounds.  Abdominal:     General: Bowel sounds are normal. There is no distension.     Palpations: Abdomen is soft.     Tenderness: There is no abdominal tenderness.  Musculoskeletal:        General: Normal range of motion.     Cervical back: Neck supple.     Right lower leg: No edema.     Left lower leg: No edema.  Lymphadenopathy:     Cervical: No cervical adenopathy.  Skin:    General: Skin is warm and dry.  Neurological:     Mental Status: She is alert. Mental status is at baseline.  Psychiatric:        Mood and Affect: Mood normal.    ASSESSMENT/ PLAN:  TODAY  Herpes: is stable no recent outbreaks; will continue valtrex 500 mg daily  2. Vitamin B 12 deficiency: is stable level 877; will continue 1000 mcg daily   3. Chronic constipation: stable will continue miralax daily and senna s daily   4. Major depression recurrent chronic: is stable will continue cymbalta 20 mg twice daily lamictal 25 mg twice daily for mood and melatonin 5 mg nightly for sleep.    PREVIOUS   5. Hypertension associated with type 2 diabetes mellitus: is stable b/p 148/77 will continue norvasc 10 mg daily asa 81 mg daily   6. Dyslipidemia associated with type 2 diabetes mellitus is stable LDL 70 will continue zocor 10 mg daily   7. 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 Gastroesophageal reflux disease without esophagitis: is stable will continue prilosec 20 mg daily   10. OAB: is stable will continue myrbetrig 50  mg daily   11 Chronic generalized pain: is stable will continue tylenol cr 650 mg every 6 hours voltaren gel 2 gm to both knees twice daily as needed   12. Chronic non-seasonal  allergic rhinitis is stable will continue claritin 10 mg daily nasocort daily and singulair 10 mg daily     Synthia Innocent NP [facility]  Contact [phone] Monday through Friday 8am- 5pm  After hours call [phone]

## 2021-06-30 ENCOUNTER — Non-Acute Institutional Stay (SKILLED_NURSING_FACILITY): Payer: Medicare Other | Admitting: Adult Health

## 2021-06-30 ENCOUNTER — Encounter: Payer: Self-pay | Admitting: Adult Health

## 2021-06-30 DIAGNOSIS — N3281 Overactive bladder: Secondary | ICD-10-CM | POA: Diagnosis not present

## 2021-06-30 DIAGNOSIS — G8929 Other chronic pain: Secondary | ICD-10-CM

## 2021-06-30 DIAGNOSIS — R52 Pain, unspecified: Secondary | ICD-10-CM

## 2021-06-30 DIAGNOSIS — E1151 Type 2 diabetes mellitus with diabetic peripheral angiopathy without gangrene: Secondary | ICD-10-CM | POA: Diagnosis not present

## 2021-06-30 NOTE — Progress Notes (Signed)
Location:  [facility] Room Number: 115-W Place of Service:  SNF (31)   CODE STATUS: Full Code   Allergies  Allergen Reactions  . Lisinopril   . Penicillins Nausea Only    Has patient had a PCN reaction causing immediate rash, facial/tongue/throat swelling, SOB or lightheadedness with hypotension: Yes Has patient had a PCN reaction causing severe rash involving mucus membranes or skin necrosis: No Has patient had a PCN reaction that required hospitalization No Has patient had a PCN reaction occurring within the last 10 years: No If all of the above answers are "NO", then may proceed with Cephalosporin use.   . Sulfa Antibiotics Nausea And Vomiting    Chief Complaint  Patient presents with  . Acute Visit    Pain management     HPI:  She has chronic pain issues for which she is taking tylenol cr 650 mg every 6 hours; cymbalta 20 mg daily and gabapentin 100 mg nightly. She tells me today that she is feeling better; but that the pain is worse at night and is interfering with her sleep. She also complains of frequent urination at night. The pain starts in her back and goes down her right leg. The pain is sharp and achy.   Past Medical History:  Diagnosis Date  . Anxiety   . Bowel obstruction (HCC)   . Burning with urination 01/04/2016  . Diabetes mellitus without complication (HCC)   . Genital herpes   . GERD (gastroesophageal reflux disease)   . Hematuria 01/04/2016  . Herpes 12/22/2015  . Hyperlipidemia   . Hypertension   . LLQ abdominal tenderness 01/04/2016  . Neuropathy   . Pelvic pressure in female 01/04/2016  . Recurrent UTI   . Sciatic nerve disease   . Small bowel obstruction (HCC) 04/27/2016  . Urinary frequency 01/04/2016  . Urticaria     Past Surgical History:  Procedure Laterality Date  . ABDOMINAL HYSTERECTOMY    . APPLICATION OF INTRAOPERATIVE CT SCAN N/A 12/19/2018   Procedure: APPLICATION OF INTRAOPERATIVE CT SCAN;  Surgeon: Maeola Harman, MD;  Location: [facility] OR;  Service: Neurosurgery;  Laterality: N/A;  . bowel obstruction    . COLON SURGERY     blocked colon  . POSTERIOR CERVICAL FUSION/FORAMINOTOMY N/A 12/19/2018   Procedure: Cervical Seven to Thoracic One Posterior cervical fusion;  Surgeon: Maeola Harman, MD;  Location: [facility] OR;  Service: Neurosurgery;  Laterality: N/A;  Cervical Seven to Thoracic One Posterior cervical fusion    Social History   Socioeconomic History  . Marital status: Widowed    Spouse name: Not on file  . Number of children: Not on file  . Years of education: Not on file  . Highest education level: Not on file  Occupational History  . Occupation: retired   Tobacco Use  . Smoking status: Never  . Smokeless tobacco: Current    Types: Snuff  Vaping Use  . Vaping Use: Never used  Substance and Sexual Activity  . Alcohol use: No  . Drug use: No  . Sexual activity: Not Currently    Birth control/protection: Surgical    Comment: hyst  Other Topics Concern  . Not on file  Social History Narrative   Long term resident of [facility]    Social Determinants of Health   Financial Resource Strain: Not on file  Food Insecurity: Not on file  Transportation Needs: Not on file  Physical Activity: Not on file  Stress: Not on file  Social Connections: Not on file  Intimate Partner Violence: Not on file   Family History  Problem Relation Age of Onset  . Diabetes Mother   . Heart disease Brother   . Hypertension Brother   . Diabetes Brother   . Hypertension Daughter   . Diabetes Brother   . Diabetes Brother   . Alzheimer's disease Brother   . Anxiety disorder Daughter       VITAL SIGNS BP (!) 154/82   Pulse 80   Temp 97.6 F (36.4 C)   Resp 18   Ht 5' (1.524 m)   Wt 192 lb 3.2 oz (87.2 kg)   SpO2 98%   BMI 37.54 kg/m   Outpatient Encounter Medications as of 06/30/2021  Medication Sig  . acetaminophen (TYLENOL) 650 MG CR tablet Take 650 mg by mouth every 6 (six) hours.  Marland Kitchen  amLODipine (NORVASC) 10 MG tablet Take 1 tablet (10 mg total) by mouth daily.  Marland Kitchen aspirin 81 MG chewable tablet Chew 81 mg by mouth daily.  . cyanocobalamin 1000 MCG tablet Take 1,000 mcg by mouth daily. Special Instructions: for vit B 12 deficiency  . diclofenac Sodium (VOLTAREN) 1 % GEL Apply 2 g topically 2 (two) times daily as needed.  . DULoxetine (CYMBALTA) 20 MG capsule Take 20 mg by mouth daily.  Marland Kitchen gabapentin (NEURONTIN) 100 MG capsule Take 100 mg by mouth at bedtime.  . lamoTRIgine (LAMICTAL) 25 MG tablet Take 25 mg by mouth 2 (two) times daily.   Marland Kitchen linagliptin (TRADJENTA) 5 MG TABS tablet Take 5 mg by mouth daily.  Marland Kitchen loratadine (CLARITIN) 10 MG tablet Take 10 mg by mouth daily.  Marland Kitchen losartan (COZAAR) 25 MG tablet Take 12.5 mg by mouth daily.  . Melatonin 5 MG TABS Take 5 mg by mouth at bedtime.  . metFORMIN (GLUCOPHAGE) 1000 MG tablet Take 1,000 mg by mouth 2 (two) times daily with a meal.   . mirabegron ER (MYRBETRIQ) 50 MG TB24 tablet Take 50 mg by mouth daily.  . montelukast (SINGULAIR) 10 MG tablet Take 1 tablet (10 mg total) by mouth at bedtime.  . NON FORMULARY Diet:  Consistent Carbohydrate,  . omeprazole (PRILOSEC) 20 MG capsule Take 20 mg by mouth daily. Special Instructions: TAKE 1 CAPSULE BY MOUTH EVERY MORNING *TAKE ON AN EMPTY STOMACH* *DO NOT CRUSH* (FORMULARY SUB FOR  . polyethylene glycol (MIRALAX / GLYCOLAX) packet Take 17 g by mouth daily.  Marland Kitchen senna-docusate (SENOKOT-S) 8.6-50 MG tablet Take 1 tablet by mouth at bedtime.   . simvastatin (ZOCOR) 10 MG tablet Take 10 mg by mouth at bedtime.   . triamcinolone (NASACORT) 55 MCG/ACT AERO nasal inhaler Place 1 spray into the nose daily.  . valACYclovir (VALTREX) 500 MG tablet Take 500 mg by mouth daily.    No facility-administered encounter medications on file as of 06/30/2021.     SIGNIFICANT DIAGNOSTIC EXAMS  PREVIOUS  07-16-20 DEXA: t score 0.825   NO NEW EXAMS.   LABS REVIEWED PREVIOUS;    08-27-20: hepatitis C:  neg 08-31-20: urine culture: multiple species 09-21-20: urine culture: e-coli: cipro  10-01-20: wbc 14.8; hgb 13.5; hct 43.3; mcv 87.7 plt 240; glucose 146; bun 19; creat 1.05; k+ 4.4; na++ 136; ca 9.7 vitamin B 12: 877; folate 13.9; tsh 4.543 11-16-20: wbc 11.7; hgb 12.7; hct 40.9; mcv 887 plt 213 urine culture: klebsiella pneumoniae: cipro 12-19-20: urine culture: e-coli 12-30-20: hgb a1c 6.9  02-26-21: urine culture: 80,000 e-coli 03-18-21: hgb a1c 7.5 chol 169;  ldl 70; trig 222; hdl 55 04-05-21:wbc 8.6; hgb 11.5; hct 37.2; mcv 87.7 plt 184; glucose 180; bun 18; creat 0.85; k+ 3.7; na++ 135; ca 8.9; GFR>60; liver normal albumin 3.6 04-08-21: glucose 148; bun 22; creat 0.89; k+ 3.7; na++ 137; ca 8.8; GFR >60; urine micro-albumin <0.3   NO NEW LABS.   Review of Systems  Constitutional:  Negative for malaise/fatigue.  Respiratory:  Negative for cough and shortness of breath.   Cardiovascular:  Negative for chest pain, palpitations and leg swelling.  Gastrointestinal:  Negative for abdominal pain, constipation and heartburn.  Genitourinary:  Positive for frequency.  Musculoskeletal:  Positive for back pain. Negative for joint pain and myalgias.  Skin: Negative.   Neurological:  Negative for dizziness.  Psychiatric/Behavioral:  The patient is not nervous/anxious.    Physical Exam Constitutional:      General: She is not in acute distress.    Appearance: She is well-developed. She is obese. She is not diaphoretic.  Neck:     Thyroid: No thyromegaly.  Cardiovascular:     Rate and Rhythm: Normal rate and regular rhythm.     Pulses: Normal pulses.     Heart sounds: Normal heart sounds.  Pulmonary:     Effort: Pulmonary effort is normal. No respiratory distress.     Breath sounds: Normal breath sounds.  Abdominal:     General: Bowel sounds are normal. There is no distension.     Palpations: Abdomen is soft.     Tenderness: There is no abdominal tenderness.  Musculoskeletal:        General:  Normal range of motion.     Cervical back: Neck supple.     Right lower leg: No edema.     Left lower leg: No edema.  Lymphadenopathy:     Cervical: No cervical adenopathy.  Skin:    General: Skin is warm and dry.  Neurological:     Mental Status: She is alert. Mental status is at baseline.  Psychiatric:        Mood and Affect: Mood normal.     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

## 2021-07-01 ENCOUNTER — Encounter: Payer: Self-pay | Admitting: Adult Health

## 2021-07-01 ENCOUNTER — Non-Acute Institutional Stay (SKILLED_NURSING_FACILITY): Payer: Medicare Other | Admitting: Adult Health

## 2021-07-01 DIAGNOSIS — F339 Major depressive disorder, recurrent, unspecified: Secondary | ICD-10-CM

## 2021-07-01 DIAGNOSIS — E1159 Type 2 diabetes mellitus with other circulatory complications: Secondary | ICD-10-CM | POA: Diagnosis not present

## 2021-07-01 DIAGNOSIS — E1151 Type 2 diabetes mellitus with diabetic peripheral angiopathy without gangrene: Secondary | ICD-10-CM | POA: Diagnosis not present

## 2021-07-01 DIAGNOSIS — I700 Atherosclerosis of aorta: Secondary | ICD-10-CM

## 2021-07-01 DIAGNOSIS — I152 Hypertension secondary to endocrine disorders: Secondary | ICD-10-CM

## 2021-07-01 NOTE — Progress Notes (Signed)
Location:  [facility] Room Number: 115-W Place of Service:  SNF (31)   CODE STATUS: Full  Allergies  Allergen Reactions   Lisinopril    Penicillins Nausea Only    Has patient had a PCN reaction causing immediate rash, facial/tongue/throat swelling, SOB or lightheadedness with hypotension: Yes Has patient had a PCN reaction causing severe rash involving mucus membranes or skin necrosis: No Has patient had a PCN reaction that required hospitalization No Has patient had a PCN reaction occurring within the last 10 years: No If all of the above answers are "NO", then may proceed with Cephalosporin use.    Sulfa Antibiotics Nausea And Vomiting   Chief Complaint  Patient presents with   Acute Visit    Care plan meeting      HPI:  We have come together for her care plan meeting.  BIMS 13/15; mood 0/30. She is independent to supervision with her adls. She is able to feed herself. She is occasionally incontinent of bladder and bowel. There have been no falls. She does go on outings with family. Dietary:   weight is 192.2 pounds has a good appetite; on a carbohydrate concentrated diet. Therapy none at this time.  She has had her gabapentin to increased to 200 mg nightly; and has had her cymbalta lowered to 20 mg daily. She has been covid positive on 06-13-21; there were no complications. Her cbg readings are stable.  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   Hypertension associated with type 2 diabetes mellitus  Past Medical History:  Diagnosis Date   Anxiety    Bowel obstruction (HCC)    Burning with urination 01/04/2016   Diabetes mellitus without complication (HCC)    Genital herpes    GERD (gastroesophageal reflux disease)    Hematuria 01/04/2016   Herpes 12/22/2015   Hyperlipidemia    Hypertension    LLQ abdominal tenderness  01/04/2016   Neuropathy    Pelvic pressure in female 01/04/2016   Recurrent UTI    Sciatic nerve disease    Small bowel obstruction (HCC) 04/27/2016   Urinary frequency 01/04/2016   Urticaria     Past Surgical History:  Procedure Laterality Date   ABDOMINAL HYSTERECTOMY     APPLICATION OF INTRAOPERATIVE CT SCAN N/A 12/19/2018   Procedure: APPLICATION OF INTRAOPERATIVE CT SCAN;  Surgeon: Maeola Harman, MD;  Location: [facility] OR;  Service: Neurosurgery;  Laterality: N/A;   bowel obstruction     COLON SURGERY     blocked colon   POSTERIOR CERVICAL FUSION/FORAMINOTOMY N/A 12/19/2018   Procedure: Cervical Seven to Thoracic One Posterior cervical fusion;  Surgeon: Maeola Harman, MD;  Location: [facility] OR;  Service: Neurosurgery;  Laterality: N/A;  Cervical Seven to Thoracic One Posterior cervical fusion    Social History   Socioeconomic History   Marital status: Widowed    Spouse name: Not on file   Number of children: Not on file   Years of education: Not on file   Highest education level: Not on file  Occupational History   Occupation: retired   Tobacco Use   Smoking status: Never   Smokeless tobacco: Current    Types: Snuff  Vaping Use   Vaping Use: Never used  Substance and Sexual Activity   Alcohol use: No   Drug use: No   Sexual activity: Not Currently    Birth  control/protection: Surgical    Comment: hyst  Other Topics Concern   Not on file  Social History Narrative   Long term resident of [facility]    Social Determinants of Health   Financial Resource Strain: Not on file  Food Insecurity: Not on file  Transportation Needs: Not on file  Physical Activity: Not on file  Stress: Not on file  Social Connections: Not on file  Intimate Partner Violence: Not on file   Family History  Problem Relation Age of Onset   Diabetes Mother    Heart disease Brother    Hypertension Brother    Diabetes Brother    Hypertension Daughter    Diabetes Brother    Diabetes Brother    Alzheimer's  disease Brother    Anxiety disorder Daughter       VITAL SIGNS BP (!) 154/82   Pulse 80   Temp (!) 97.1 F (36.2 C)   Resp 18   Ht 5' (1.524 m)   Wt 192 lb 3.2 oz (87.2 kg)   SpO2 98%   BMI 37.54 kg/m   Outpatient Encounter Medications as of 07/01/2021  Medication Sig   acetaminophen (TYLENOL) 650 MG CR tablet Take 650 mg by mouth every 6 (six) hours.   amLODipine (NORVASC) 10 MG tablet Take 1 tablet (10 mg total) by mouth daily.   aspirin 81 MG chewable tablet Chew 81 mg by mouth daily.   cyanocobalamin 1000 MCG tablet Take 1,000 mcg by mouth daily. Special Instructions: for vit B 12 deficiency   diclofenac Sodium (VOLTAREN) 1 % GEL Apply 2 g topically 2 (two) times daily as needed.   DULoxetine (CYMBALTA) 20 MG capsule Take 20 mg by mouth daily.   gabapentin (NEURONTIN) 100 MG capsule Take 200 mg by mouth at bedtime.   lamoTRIgine (LAMICTAL) 25 MG tablet Take 25 mg by mouth 2 (two) times daily.    linagliptin (TRADJENTA) 5 MG TABS tablet Take 5 mg by mouth daily.   loratadine (CLARITIN) 10 MG tablet Take 10 mg by mouth daily.   losartan (COZAAR) 25 MG tablet Take 12.5 mg by mouth daily.   Melatonin 5 MG TABS Take 5 mg by mouth at bedtime.   metFORMIN (GLUCOPHAGE) 1000 MG tablet Take 1,000 mg by mouth 2 (two) times daily with a meal.    mirabegron ER (MYRBETRIQ) 50 MG TB24 tablet Take 50 mg by mouth daily.   montelukast (SINGULAIR) 10 MG tablet Take 1 tablet (10 mg total) by mouth at bedtime.   NON FORMULARY Diet:  Consistent Carbohydrate,   omeprazole (PRILOSEC) 20 MG capsule Take 20 mg by mouth daily. Special Instructions: TAKE 1 CAPSULE BY MOUTH EVERY MORNING *TAKE ON AN EMPTY STOMACH* *DO NOT CRUSH* (FORMULARY SUB FOR   polyethylene glycol (MIRALAX / GLYCOLAX) packet Take 17 g by mouth daily.   senna-docusate (SENOKOT-S) 8.6-50 MG tablet Take 1 tablet by mouth at bedtime.    simvastatin (ZOCOR) 10 MG tablet Take 10 mg by mouth at bedtime.    triamcinolone (NASACORT) 55  MCG/ACT AERO nasal inhaler Place 1 spray into the nose daily.   valACYclovir (VALTREX) 500 MG tablet Take 500 mg by mouth daily.    No facility-administered encounter medications on file as of 07/01/2021.     SIGNIFICANT DIAGNOSTIC EXAMS   PREVIOUS  07-16-20 DEXA: t score 0.825   NO NEW EXAMS.   LABS REVIEWED PREVIOUS;    08-27-20: hepatitis C: neg 08-31-20: urine culture: multiple species 09-21-20: urine culture: e-coli: cipro  10-01-20: wbc 14.8; hgb 13.5; hct 43.3; mcv 87.7 plt 240; glucose 146; bun 19; creat 1.05; k+ 4.4; na++ 136; ca 9.7 vitamin B 12: 877; folate 13.9; tsh 4.543 11-16-20: wbc 11.7; hgb 12.7; hct 40.9; mcv 887 plt 213 urine culture: klebsiella pneumoniae: cipro 12-19-20: urine culture: e-coli 12-30-20: hgb a1c 6.9  02-26-21: urine culture: 80,000 e-coli 03-18-21: hgb a1c 7.5 chol 169; ldl 70; trig 222; hdl 55 04-05-21:wbc 8.6; hgb 11.5; hct 37.2; mcv 87.7 plt 184; glucose 180; bun 18; creat 0.85; k+ 3.7; na++ 135; ca 8.9; GFR>60; liver normal albumin 3.6 04-08-21: glucose 148; bun 22; creat 0.89; k+ 3.7; na++ 137; ca 8.8; GFR >60; urine micro-albumin <0.3   NO NEW LABS.   Review of Systems  Constitutional:  Negative for malaise/fatigue.  Respiratory:  Negative for cough and shortness of breath.   Cardiovascular:  Negative for chest pain, palpitations and leg swelling.  Gastrointestinal:  Negative for abdominal pain, constipation and heartburn.  Musculoskeletal:  Negative for back pain, joint pain and myalgias.  Skin: Negative.   Neurological:  Negative for dizziness.  Psychiatric/Behavioral:  The patient is not nervous/anxious.    Physical Exam Constitutional:      General: She is not in acute distress.    Appearance: She is well-developed. She is obese. She is not diaphoretic.  Neck:     Thyroid: No thyromegaly.  Cardiovascular:     Rate and Rhythm: Normal rate and regular rhythm.     Pulses: Normal pulses.     Heart sounds: Normal heart sounds.  Pulmonary:      Effort: Pulmonary effort is normal. No respiratory distress.     Breath sounds: Normal breath sounds.  Abdominal:     General: Bowel sounds are normal. There is no distension.     Palpations: Abdomen is soft.     Tenderness: There is no abdominal tenderness.  Musculoskeletal:        General: Normal range of motion.     Cervical back: Neck supple.     Right lower leg: No edema.     Left lower leg: No edema.  Lymphadenopathy:     Cervical: No cervical adenopathy.  Skin:    General: Skin is warm and dry.  Neurological:     Mental Status: She is alert. Mental status is at baseline.  Psychiatric:        Mood and Affect: Mood normal.     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    Time spent with patient 40 minutes: medications; plan of care; goals of care.    Synthia Innocent NP [facility]  Contact [phone] Monday through Friday 8am- 5pm  After hours call [phone]

## 2021-07-15 ENCOUNTER — Encounter: Payer: Self-pay | Admitting: Adult Health

## 2021-07-15 ENCOUNTER — Non-Acute Institutional Stay (SKILLED_NURSING_FACILITY): Payer: Medicare Other | Admitting: Adult Health

## 2021-07-15 DIAGNOSIS — J3089 Other allergic rhinitis: Secondary | ICD-10-CM

## 2021-07-15 DIAGNOSIS — F339 Major depressive disorder, recurrent, unspecified: Secondary | ICD-10-CM

## 2021-07-15 NOTE — Progress Notes (Signed)
Location:  [facility] Room Number: 115-W Place of Service:  SNF (31)   CODE STATUS: Full Code   Allergies  Allergen Reactions   Lisinopril    Penicillins Nausea Only    Has patient had a PCN reaction causing immediate rash, facial/tongue/throat swelling, SOB or lightheadedness with hypotension: Yes Has patient had a PCN reaction causing severe rash involving mucus membranes or skin necrosis: No Has patient had a PCN reaction that required hospitalization No Has patient had a PCN reaction occurring within the last 10 years: No If all of the above answers are "NO", then may proceed with Cephalosporin use.    Sulfa Antibiotics Nausea And Vomiting    Chief Complaint  Patient presents with   Acute Visit    Mood Concerns    HPI:  She has had her cymbalta reduced from 20 mg twice daily to one time daily. Since that time staff has noted increased agitated behavior with irritability. There are no reports of insomnia; no changes in appetite. She is complaining of increased sinus congestion; mild cough without sputum. There are no reports of fevers.   Past Medical History:  Diagnosis Date   Anxiety    Bowel obstruction (HCC)    Burning with urination 01/04/2016   Diabetes mellitus without complication (HCC)    Genital herpes    GERD (gastroesophageal reflux disease)    Hematuria 01/04/2016   Herpes 12/22/2015   Hyperlipidemia    Hypertension    LLQ abdominal tenderness 01/04/2016   Neuropathy    Pelvic pressure in female 01/04/2016   Recurrent UTI    Sciatic nerve disease    Small bowel obstruction (HCC) 04/27/2016   Urinary frequency 01/04/2016   Urticaria     Past Surgical History:  Procedure Laterality Date   ABDOMINAL HYSTERECTOMY     APPLICATION OF INTRAOPERATIVE CT SCAN N/A 12/19/2018   Procedure: APPLICATION OF INTRAOPERATIVE CT SCAN;  Surgeon: Maeola Harman, MD;  Location: [facility] OR;  Service: Neurosurgery;  Laterality: N/A;   bowel obstruction      COLON SURGERY     blocked colon   POSTERIOR CERVICAL FUSION/FORAMINOTOMY N/A 12/19/2018   Procedure: Cervical Seven to Thoracic One Posterior cervical fusion;  Surgeon: Maeola Harman, MD;  Location[facility] OR;  Service: Neurosurgery;  Laterality: N/A;  Cervical Seven to Thoracic One Posterior cervical fusion    Social History   Socioeconomic History   Marital status: Widowed    Spouse name: Not on file   Number of children: Not on file   Years of education: Not on file   Highest education level: Not on file  Occupational History   Occupation: retired   Tobacco Use   Smoking status: Never   Smokeless tobacco: Current    Types: Snuff  Vaping Use   Vaping Use: Never used  Substance and Sexual Activity   Alcohol use: No   Drug use: No   Sexual activity: Not Currently    Birth control/protection: Surgical    Comment: hyst  Other Topics Concern   Not on file  Social History Narrative   Long term resident of [facility]    Social Determinants of Health   Financial Resource Strain: Not on file  Food Insecurity: Not on file  Transportation Needs: Not on file  Physical Activity: Not on file  Stress: Not on file  Social Connections: Not on file  Intimate Partner Violence: Not on file   Family History  Problem Relation Age of Onset  Diabetes Mother    Heart disease Brother    Hypertension Brother    Diabetes Brother    Hypertension Daughter    Diabetes Brother    Diabetes Brother    Alzheimer's disease Brother    Anxiety disorder Daughter       VITAL SIGNS BP (!) 154/77   Pulse 69   Temp (!) 97.1 F (36.2 C)   Resp 16   Ht 5' (1.524 m)   Wt 194 lb 3.2 oz (88.1 kg)   SpO2 98%   BMI 37.93 kg/m   Outpatient Encounter Medications as of 07/15/2021  Medication Sig   acetaminophen (TYLENOL) 650 MG CR tablet Take 650 mg by mouth every 6 (six) hours.   amLODipine (NORVASC) 10 MG tablet Take 1 tablet (10 mg total) by mouth daily.   aspirin 81 MG chewable tablet Chew 81 mg by  mouth daily.   cyanocobalamin 1000 MCG tablet Take 1,000 mcg by mouth daily. Special Instructions: for vit B 12 deficiency   diclofenac Sodium (VOLTAREN) 1 % GEL Apply 2 g topically 2 (two) times daily as needed.   DULoxetine (CYMBALTA) 20 MG capsule Take 20 mg by mouth 2 (two) times daily.   lamoTRIgine (LAMICTAL) 25 MG tablet Take 25 mg by mouth 2 (two) times daily.    linagliptin (TRADJENTA) 5 MG TABS tablet Take 5 mg by mouth daily.   loratadine (CLARITIN) 10 MG tablet Take 10 mg by mouth daily.   losartan (COZAAR) 25 MG tablet Take 12.5 mg by mouth daily.   Melatonin 5 MG TABS Take 5 mg by mouth at bedtime.   metFORMIN (GLUCOPHAGE) 1000 MG tablet Take 1,000 mg by mouth 2 (two) times daily with a meal.    mirabegron ER (MYRBETRIQ) 50 MG TB24 tablet Take 50 mg by mouth daily.   montelukast (SINGULAIR) 10 MG tablet Take 1 tablet (10 mg total) by mouth at bedtime.   NON FORMULARY Diet:  Consistent Carbohydrate,   omeprazole (PRILOSEC) 20 MG capsule Take 20 mg by mouth daily. Special Instructions: TAKE 1 CAPSULE BY MOUTH EVERY MORNING *TAKE ON AN EMPTY STOMACH* *DO NOT CRUSH* (FORMULARY SUB FOR   polyethylene glycol (MIRALAX / GLYCOLAX) packet Take 17 g by mouth daily.   senna-docusate (SENOKOT-S) 8.6-50 MG tablet Take 1 tablet by mouth at bedtime.    simvastatin (ZOCOR) 10 MG tablet Take 10 mg by mouth at bedtime.    triamcinolone (NASACORT) 55 MCG/ACT AERO nasal inhaler Place 1 spray into the nose daily.   valACYclovir (VALTREX) 500 MG tablet Take 500 mg by mouth daily.    [DISCONTINUED] gabapentin (NEURONTIN) 100 MG capsule Take 200 mg by mouth at bedtime.   No facility-administered encounter medications on file as of 07/15/2021.     SIGNIFICANT DIAGNOSTIC EXAMS  PREVIOUS  07-16-20 DEXA: t score 0.825   NO NEW EXAMS.   LABS REVIEWED PREVIOUS;    08-27-20: hepatitis C: neg 08-31-20: urine culture: multiple species 09-21-20: urine culture: e-coli: cipro  10-01-20: wbc 14.8; hgb  13.5; hct 43.3; mcv 87.7 plt 240; glucose 146; bun 19; creat 1.05; k+ 4.4; na++ 136; ca 9.7 vitamin B 12: 877; folate 13.9; tsh 4.543 11-16-20: wbc 11.7; hgb 12.7; hct 40.9; mcv 887 plt 213 urine culture: klebsiella pneumoniae: cipro 12-19-20: urine culture: e-coli 12-30-20: hgb a1c 6.9  02-26-21: urine culture: 80,000 e-coli 03-18-21: hgb a1c 7.5 chol 169; ldl 70; trig 222; hdl 55 04-05-21:wbc 8.6; hgb 11.5; hct 37.2; mcv 87.7 plt 184; glucose 180; bun  18; creat 0.85; k+ 3.7; na++ 135; ca 8.9; GFR>60; liver normal albumin 3.6 04-08-21: glucose 148; bun 22; creat 0.89; k+ 3.7; na++ 137; ca 8.8; GFR >60; urine micro-albumin <0.3   NO NEW LABS.   Review of Systems  Constitutional:  Negative for malaise/fatigue.  HENT:  Positive for congestion. Negative for sore throat.   Respiratory:  Positive for cough. Negative for sputum production and shortness of breath.   Cardiovascular:  Negative for chest pain, palpitations and leg swelling.  Gastrointestinal:  Negative for abdominal pain, constipation and heartburn.  Musculoskeletal:  Negative for back pain, joint pain and myalgias.  Skin: Negative.   Neurological:  Negative for dizziness.  Psychiatric/Behavioral:  Positive for depression. The patient is nervous/anxious. The patient does not have insomnia.     Physical Exam Constitutional:      General: She is not in acute distress.    Appearance: She is obese. She is not diaphoretic.  HENT:     Nose: Nose normal.     Mouth/Throat:     Mouth: Mucous membranes are moist.     Pharynx: Oropharynx is clear.  Eyes:     Conjunctiva/sclera: Conjunctivae normal.  Neck:     Thyroid: No thyromegaly.     Vascular: No JVD.  Cardiovascular:     Rate and Rhythm: Normal rate and regular rhythm.     Pulses: Normal pulses.  Pulmonary:     Effort: Pulmonary effort is normal. No respiratory distress.     Breath sounds: Normal breath sounds. No wheezing.  Abdominal:     General: Bowel sounds are normal. There  is no distension.     Palpations: Abdomen is soft.     Tenderness: There is no abdominal tenderness.  Musculoskeletal:        General: Normal range of motion.     Cervical back: Neck supple.     Right lower leg: No edema.     Left lower leg: No edema.     Comments: Able to move all extremities   Lymphadenopathy:     Cervical: No cervical adenopathy.  Skin:    General: Skin is warm and dry.  Neurological:     Mental Status: She is alert. Mental status is at baseline.  Psychiatric:        Mood and Affect: Mood normal.     ASSESSMENT/ PLAN:  TODAY  Major depression recurrent chronic: Sinus congestion   Is worse: will return cymbalta to 20 mg twice daily has failed one wean.  Will begin mucinex 600 mg twice daily through 07-22-21.    Synthia Innocent NP [facility]  Contact [phone] Monday through Friday 8am- 5pm  After hours call [phone]

## 2021-07-16 ENCOUNTER — Encounter (HOSPITAL_COMMUNITY)
Admission: RE | Admit: 2021-07-16 | Discharge: 2021-07-16 | Disposition: A | Discharge status: Home / Self Care | Payer: Medicare Other | Source: Skilled Nursing Facility | Attending: Adult Health | Admitting: Adult Health

## 2021-07-16 DIAGNOSIS — N3281 Overactive bladder: Secondary | ICD-10-CM | POA: Insufficient documentation

## 2021-07-16 LAB — URINALYSIS, ROUTINE W REFLEX MICROSCOPIC
Bilirubin Urine: NEGATIVE
Glucose, UA: NEGATIVE mg/dL
Hgb urine dipstick: NEGATIVE
Ketones, ur: NEGATIVE mg/dL
Nitrite: POSITIVE — AB
Protein, ur: NEGATIVE mg/dL
Specific Gravity, Urine: 1.027 (ref 1.005–1.030)
pH: 5 (ref 5.0–8.0)

## 2021-07-21 ENCOUNTER — Encounter (HOSPITAL_COMMUNITY)
Admission: RE | Admit: 2021-07-21 | Discharge: 2021-07-21 | Disposition: A | Discharge status: Home / Self Care | Payer: Medicare Other | Source: Skilled Nursing Facility | Attending: Adult Health | Admitting: Adult Health

## 2021-07-21 DIAGNOSIS — N390 Urinary tract infection, site not specified: Secondary | ICD-10-CM | POA: Diagnosis present

## 2021-07-21 DIAGNOSIS — N3281 Overactive bladder: Secondary | ICD-10-CM | POA: Diagnosis not present

## 2021-07-23 ENCOUNTER — Non-Acute Institutional Stay (SKILLED_NURSING_FACILITY): Payer: Medicare Other | Admitting: Adult Health

## 2021-07-23 ENCOUNTER — Encounter: Payer: Self-pay | Admitting: Adult Health

## 2021-07-23 DIAGNOSIS — B9620 Unspecified Escherichia coli [E. coli] as the cause of diseases classified elsewhere: Secondary | ICD-10-CM

## 2021-07-23 DIAGNOSIS — N390 Urinary tract infection, site not specified: Secondary | ICD-10-CM

## 2021-07-23 NOTE — Progress Notes (Signed)
Location:  [facility] Room Number: 115-W Place of Service:  SNF (31)   CODE STATUS: Full code  Allergies  Allergen Reactions   Lisinopril    Penicillins Nausea Only    Has patient had a PCN reaction causing immediate rash, facial/tongue/throat swelling, SOB or lightheadedness with hypotension: Yes Has patient had a PCN reaction causing severe rash involving mucus membranes or skin necrosis: No Has patient had a PCN reaction that required hospitalization No Has patient had a PCN reaction occurring within the last 10 years: No If all of the above answers are "NO", then may proceed with Cephalosporin use.    Sulfa Antibiotics Nausea And Vomiting    Chief Complaint  Patient presents with   Acute Visit    Urinary tract infection (UTI)    HPI:  She has been having increased frequency; pressure discomfort. She denies any nausea or vomiting. No fevers. Her urine culture grew out e-coli   Past Medical History:  Diagnosis Date   Anxiety    Bowel obstruction (HCC)    Burning with urination 01/04/2016   Diabetes mellitus without complication (HCC)    Genital herpes    GERD (gastroesophageal reflux disease)    Hematuria 01/04/2016   Herpes 12/22/2015   Hyperlipidemia    Hypertension    LLQ abdominal tenderness 01/04/2016   Neuropathy    Pelvic pressure in female 01/04/2016   Recurrent UTI    Sciatic nerve disease    Small bowel obstruction (HCC) 04/27/2016   Urinary frequency 01/04/2016   Urticaria     Past Surgical History:  Procedure Laterality Date   ABDOMINAL HYSTERECTOMY     APPLICATION OF INTRAOPERATIVE CT SCAN N/A 12/19/2018   Procedure: APPLICATION OF INTRAOPERATIVE CT SCAN;  Surgeon: Maeola Harman, MD;  Location: [facility] OR;  Service: Neurosurgery;  Laterality: N/A;   bowel obstruction     COLON SURGERY     blocked colon   POSTERIOR CERVICAL FUSION/FORAMINOTOMY N/A 12/19/2018   Procedure: Cervical Seven to Thoracic One Posterior cervical fusion;   Surgeon: Maeola Harman, MD;  Location: [facility] OR;  Service: Neurosurgery;  Laterality: N/A;  Cervical Seven to Thoracic One Posterior cervical fusion    Social History   Socioeconomic History   Marital status: Widowed    Spouse name: Not on file   Number of children: Not on file   Years of education: Not on file   Highest education level: Not on file  Occupational History   Occupation: retired   Tobacco Use   Smoking status: Never   Smokeless tobacco: Current    Types: Snuff  Vaping Use   Vaping Use: Never used  Substance and Sexual Activity   Alcohol use: No   Drug use: No   Sexual activity: Not Currently    Birth control/protection: Surgical    Comment: hyst  Other Topics Concern   Not on file  Social History Narrative   Long term resident of [facility]    Social Determinants of Health   Financial Resource Strain: Not on file  Food Insecurity: Not on file  Transportation Needs: Not on file  Physical Activity: Not on file  Stress: Not on file  Social Connections: Not on file  Intimate Partner Violence: Not on file   Family History  Problem Relation Age of Onset   Diabetes Mother    Heart disease Brother    Hypertension Brother    Diabetes Brother    Hypertension Daughter    Diabetes Brother  Diabetes Brother    Alzheimer's disease Brother    Anxiety disorder Daughter       VITAL SIGNS BP (!) 147/78   Pulse 68   Temp 98.4 F (36.9 C)   Ht 5' (1.524 m)   Wt 194 lb 3.2 oz (88.1 kg)   SpO2 98%   BMI 37.93 kg/m   Outpatient Encounter Medications as of 07/23/2021  Medication Sig   acetaminophen (TYLENOL) 650 MG CR tablet Take 650 mg by mouth every 6 (six) hours.   amLODipine (NORVASC) 10 MG tablet Take 1 tablet (10 mg total) by mouth daily.   aspirin 81 MG chewable tablet Chew 81 mg by mouth daily.   azithromycin (ZITHROMAX) 500 MG tablet Take by mouth daily. for e-coli UTI   cyanocobalamin 1000 MCG tablet Take 1,000 mcg by mouth daily. Special Instructions:  for vit B 12 deficiency   diclofenac Sodium (VOLTAREN) 1 % GEL Apply 2 g topically 2 (two) times daily as needed.   DULoxetine (CYMBALTA) 20 MG capsule Take 20 mg by mouth 2 (two) times daily.   lamoTRIgine (LAMICTAL) 25 MG tablet Take 25 mg by mouth 2 (two) times daily.    linagliptin (TRADJENTA) 5 MG TABS tablet Take 5 mg by mouth daily.   loratadine (CLARITIN) 10 MG tablet Take 10 mg by mouth daily.   losartan (COZAAR) 25 MG tablet Take 12.5 mg by mouth daily.   Melatonin 5 MG TABS Take 5 mg by mouth at bedtime.   metFORMIN (GLUCOPHAGE) 1000 MG tablet Take 1,000 mg by mouth 2 (two) times daily with a meal.    mirabegron ER (MYRBETRIQ) 50 MG TB24 tablet Take 50 mg by mouth daily.   montelukast (SINGULAIR) 10 MG tablet Take 1 tablet (10 mg total) by mouth at bedtime.   NON FORMULARY Diet:  Consistent Carbohydrate,   omeprazole (PRILOSEC) 20 MG capsule Take 20 mg by mouth daily. Special Instructions: TAKE 1 CAPSULE BY MOUTH EVERY MORNING *TAKE ON AN EMPTY STOMACH* *DO NOT CRUSH* (FORMULARY SUB FOR   polyethylene glycol (MIRALAX / GLYCOLAX) packet Take 17 g by mouth daily.   senna-docusate (SENOKOT-S) 8.6-50 MG tablet Take 1 tablet by mouth at bedtime.    simvastatin (ZOCOR) 10 MG tablet Take 10 mg by mouth at bedtime.    triamcinolone (NASACORT) 55 MCG/ACT AERO nasal inhaler Place 1 spray into the nose daily.   valACYclovir (VALTREX) 500 MG tablet Take 500 mg by mouth daily.    No facility-administered encounter medications on file as of 07/23/2021.     SIGNIFICANT DIAGNOSTIC EXAMS  PREVIOUS  07-16-20 DEXA: t score 0.825   NO NEW EXAMS.   LABS REVIEWED PREVIOUS;    08-27-20: hepatitis C: neg 08-31-20: urine culture: multiple species 09-21-20: urine culture: e-coli: cipro  10-01-20: wbc 14.8; hgb 13.5; hct 43.3; mcv 87.7 plt 240; glucose 146; bun 19; creat 1.05; k+ 4.4; na++ 136; ca 9.7 vitamin B 12: 877; folate 13.9; tsh 4.543 11-16-20: wbc 11.7; hgb 12.7; hct 40.9; mcv 887 plt 213  urine culture: klebsiella pneumoniae: cipro 12-19-20: urine culture: e-coli 12-30-20: hgb a1c 6.9  02-26-21: urine culture: 80,000 e-coli 03-18-21: hgb a1c 7.5 chol 169; ldl 70; trig 222; hdl 55 04-05-21:wbc 8.6; hgb 11.5; hct 37.2; mcv 87.7 plt 184; glucose 180; bun 18; creat 0.85; k+ 3.7; na++ 135; ca 8.9; GFR>60; liver normal albumin 3.6 04-08-21: glucose 148; bun 22; creat 0.89; k+ 3.7; na++ 137; ca 8.8; GFR >60; urine micro-albumin <0.3   TODAY  07-23-21:  urine culture: e-coli   Review of Systems  Constitutional:  Negative for malaise/fatigue.  Respiratory:  Negative for cough and shortness of breath.   Cardiovascular:  Negative for chest pain, palpitations and leg swelling.  Gastrointestinal:  Negative for abdominal pain, constipation and heartburn.  Genitourinary:  Positive for frequency and urgency.  Musculoskeletal:  Negative for back pain, joint pain and myalgias.  Skin: Negative.   Neurological:  Negative for dizziness.  Psychiatric/Behavioral:  The patient is not nervous/anxious.     Physical Exam Constitutional:      General: She is not in acute distress.    Appearance: She is well-developed. She is obese. She is not diaphoretic.  Neck:     Thyroid: No thyromegaly.  Cardiovascular:     Rate and Rhythm: Normal rate and regular rhythm.     Pulses: Normal pulses.     Heart sounds: Normal heart sounds.  Pulmonary:     Effort: Pulmonary effort is normal. No respiratory distress.     Breath sounds: Normal breath sounds.  Abdominal:     General: Bowel sounds are normal. There is no distension.     Palpations: Abdomen is soft.     Tenderness: There is no abdominal tenderness.  Musculoskeletal:        General: Normal range of motion.     Cervical back: Neck supple.     Right lower leg: No edema.     Left lower leg: No edema.  Lymphadenopathy:     Cervical: No cervical adenopathy.  Skin:    General: Skin is warm and dry.  Neurological:     Mental Status: She is alert.  Mental status is at baseline.  Psychiatric:        Mood and Affect: Mood normal.      ASSESSMENT/ PLAN:  TODAY  E-coli UTI  will begin zithromax 500 mg daily through 07-27-21 will monitor her status.    Synthia Innocent NP [facility]  Contact [phone] Monday through Friday 8am- 5pm  After hours call [phone]

## 2021-07-24 LAB — URINE CULTURE: Culture: >=100000 — AB

## 2021-07-26 ENCOUNTER — Non-Acute Institutional Stay (SKILLED_NURSING_FACILITY): Payer: Medicare Other | Admitting: Adult Health

## 2021-07-26 DIAGNOSIS — N390 Urinary tract infection, site not specified: Secondary | ICD-10-CM | POA: Diagnosis not present

## 2021-07-26 DIAGNOSIS — B9620 Unspecified Escherichia coli [E. coli] as the cause of diseases classified elsewhere: Secondary | ICD-10-CM | POA: Diagnosis not present

## 2021-07-26 NOTE — Progress Notes (Signed)
Location:  [facility] Room Number: 115 Place of Service:  SNF (31)   CODE STATUS: full code   Allergies  Allergen Reactions   Lisinopril    Penicillins Nausea Only    Has patient had a PCN reaction causing immediate rash, facial/tongue/throat swelling, SOB or lightheadedness with hypotension: Yes Has patient had a PCN reaction causing severe rash involving mucus membranes or skin necrosis: No Has patient had a PCN reaction that required hospitalization No Has patient had a PCN reaction occurring within the last 10 years: No If all of the above answers are "NO", then may proceed with Cephalosporin use.    Sulfa Antibiotics Nausea And Vomiting    Chief Complaint  Patient presents with   Acute Visit    UTI     HPI:  Her UTI grew out e-coli; she was started on zithromax until the culture sensitivity return. The e-coli is susceptible to rocephin. She tells me today that she is feeling good. There are no reports of fevers present.   Past Medical History:  Diagnosis Date   Anxiety    Bowel obstruction (HCC)    Burning with urination 01/04/2016   Diabetes mellitus without complication (HCC)    Genital herpes    GERD (gastroesophageal reflux disease)    Hematuria 01/04/2016   Herpes 12/22/2015   Hyperlipidemia    Hypertension    LLQ abdominal tenderness 01/04/2016   Neuropathy    Pelvic pressure in female 01/04/2016   Recurrent UTI    Sciatic nerve disease    Small bowel obstruction (HCC) 04/27/2016   Urinary frequency 01/04/2016   Urticaria     Past Surgical History:  Procedure Laterality Date   ABDOMINAL HYSTERECTOMY     APPLICATION OF INTRAOPERATIVE CT SCAN N/A 12/19/2018   Procedure: APPLICATION OF INTRAOPERATIVE CT SCAN;  Surgeon: Maeola Harman, MD;  Location: [facility] OR;  Service: Neurosurgery;  Laterality: N/A;   bowel obstruction     COLON SURGERY     blocked colon   POSTERIOR CERVICAL FUSION/FORAMINOTOMY N/A 12/19/2018   Procedure: Cervical Seven  to Thoracic One Posterior cervical fusion;  Surgeon: Maeola Harman, MD;  Location: [facility] OR;  Service: Neurosurgery;  Laterality: N/A;  Cervical Seven to Thoracic One Posterior cervical fusion    Social History   Socioeconomic History   Marital status: Widowed    Spouse name: Not on file   Number of children: Not on file   Years of education: Not on file   Highest education level: Not on file  Occupational History   Occupation: retired   Tobacco Use   Smoking status: Never   Smokeless tobacco: Current    Types: Snuff  Vaping Use   Vaping Use: Never used  Substance and Sexual Activity   Alcohol use: No   Drug use: No   Sexual activity: Not Currently    Birth control/protection: Surgical    Comment: hyst  Other Topics Concern   Not on file  Social History Narrative   Long term resident of [facility]    Social Determinants of Health   Financial Resource Strain: Not on file  Food Insecurity: Not on file  Transportation Needs: Not on file  Physical Activity: Not on file  Stress: Not on file  Social Connections: Not on file  Intimate Partner Violence: Not on file   Family History  Problem Relation Age of Onset   Diabetes Mother    Heart disease Brother    Hypertension Brother  Diabetes Brother    Hypertension Daughter    Diabetes Brother    Diabetes Brother    Alzheimer's disease Brother    Anxiety disorder Daughter       VITAL SIGNS BP (!) 158/88   Pulse 80   Temp 97.6 F (36.4 C)   Ht 5' (1.524 m)   Wt 194 lb 3.2 oz (88.1 kg)   BMI 37.93 kg/m   Outpatient Encounter Medications as of 07/26/2021  Medication Sig   acetaminophen (TYLENOL) 650 MG CR tablet Take 650 mg by mouth every 6 (six) hours.   amLODipine (NORVASC) 10 MG tablet Take 1 tablet (10 mg total) by mouth daily.   aspirin 81 MG chewable tablet Chew 81 mg by mouth daily.   azithromycin (ZITHROMAX) 500 MG tablet Take by mouth daily. for e-coli UTI   cyanocobalamin 1000 MCG tablet Take 1,000 mcg by  mouth daily. Special Instructions: for vit B 12 deficiency   diclofenac Sodium (VOLTAREN) 1 % GEL Apply 2 g topically 2 (two) times daily as needed.   DULoxetine (CYMBALTA) 20 MG capsule Take 20 mg by mouth 2 (two) times daily.   lamoTRIgine (LAMICTAL) 25 MG tablet Take 25 mg by mouth 2 (two) times daily.    linagliptin (TRADJENTA) 5 MG TABS tablet Take 5 mg by mouth daily.   loratadine (CLARITIN) 10 MG tablet Take 10 mg by mouth daily.   losartan (COZAAR) 25 MG tablet Take 12.5 mg by mouth daily.   Melatonin 5 MG TABS Take 5 mg by mouth at bedtime.   metFORMIN (GLUCOPHAGE) 1000 MG tablet Take 1,000 mg by mouth 2 (two) times daily with a meal.    mirabegron ER (MYRBETRIQ) 50 MG TB24 tablet Take 50 mg by mouth daily.   montelukast (SINGULAIR) 10 MG tablet Take 1 tablet (10 mg total) by mouth at bedtime.   NON FORMULARY Diet:  Consistent Carbohydrate,   omeprazole (PRILOSEC) 20 MG capsule Take 20 mg by mouth daily. Special Instructions: TAKE 1 CAPSULE BY MOUTH EVERY MORNING *TAKE ON AN EMPTY STOMACH* *DO NOT CRUSH* (FORMULARY SUB FOR   polyethylene glycol (MIRALAX / GLYCOLAX) packet Take 17 g by mouth daily.   senna-docusate (SENOKOT-S) 8.6-50 MG tablet Take 1 tablet by mouth at bedtime.    simvastatin (ZOCOR) 10 MG tablet Take 10 mg by mouth at bedtime.    triamcinolone (NASACORT) 55 MCG/ACT AERO nasal inhaler Place 1 spray into the nose daily.   valACYclovir (VALTREX) 500 MG tablet Take 500 mg by mouth daily.    No facility-administered encounter medications on file as of 07/26/2021.     SIGNIFICANT DIAGNOSTIC EXAMS  PREVIOUS  07-16-20 DEXA: t score 0.825   NO NEW EXAMS.   LABS REVIEWED PREVIOUS;    08-27-20: hepatitis C: neg 08-31-20: urine culture: multiple species 09-21-20: urine culture: e-coli: cipro  10-01-20: wbc 14.8; hgb 13.5; hct 43.3; mcv 87.7 plt 240; glucose 146; bun 19; creat 1.05; k+ 4.4; na++ 136; ca 9.7 vitamin B 12: 877; folate 13.9; tsh 4.543 11-16-20: wbc 11.7; hgb  12.7; hct 40.9; mcv 887 plt 213 urine culture: klebsiella pneumoniae: cipro 12-19-20: urine culture: e-coli 12-30-20: hgb a1c 6.9  02-26-21: urine culture: 80,000 e-coli 03-18-21: hgb a1c 7.5 chol 169; ldl 70; trig 222; hdl 55 04-05-21:wbc 8.6; hgb 11.5; hct 37.2; mcv 87.7 plt 184; glucose 180; bun 18; creat 0.85; k+ 3.7; na++ 135; ca 8.9; GFR>60; liver normal albumin 3.6 04-08-21: glucose 148; bun 22; creat 0.89; k+ 3.7; na++ 137; ca  8.8; GFR >60; urine micro-albumin <0.3   TODAY  07-23-21: urine culture: e-coli rocephin  Review of Systems  Constitutional:  Negative for malaise/fatigue.  Respiratory:  Negative for cough and shortness of breath.   Cardiovascular:  Negative for chest pain, palpitations and leg swelling.  Gastrointestinal:  Negative for abdominal pain, constipation and heartburn.  Genitourinary:  Positive for frequency and urgency.  Musculoskeletal:  Negative for back pain, joint pain and myalgias.  Skin: Negative.   Neurological:  Negative for dizziness.  Psychiatric/Behavioral:  The patient is not nervous/anxious.     Physical Exam Constitutional:      General: She is not in acute distress.    Appearance: She is well-developed. She is obese. She is not diaphoretic.  Neck:     Thyroid: No thyromegaly.  Cardiovascular:     Rate and Rhythm: Normal rate and regular rhythm.     Heart sounds: Normal heart sounds.  Pulmonary:     Effort: Pulmonary effort is normal. No respiratory distress.     Breath sounds: Normal breath sounds.  Abdominal:     General: Bowel sounds are normal. There is no distension.     Palpations: Abdomen is soft.     Tenderness: There is no abdominal tenderness.  Musculoskeletal:        General: Normal range of motion.     Cervical back: Neck supple.     Right lower leg: No edema.     Left lower leg: No edema.  Lymphadenopathy:     Cervical: No cervical adenopathy.  Skin:    General: Skin is warm and dry.  Neurological:     Mental Status: She  is alert. Mental status is at baseline.  Psychiatric:        Mood and Affect: Mood normal.      ASSESSMENT/ PLAN:  TODAY  E-coli UTI: will change to rocephin 1 gm IM daily through 07-30-21 will monitor her status.    Synthia Innocent NP [facility]  Contact [phone] Monday through Friday 8am- 5pm  After hours call [phone]

## 2021-07-30 ENCOUNTER — Encounter: Payer: Self-pay | Admitting: Internal Medicine

## 2021-07-30 ENCOUNTER — Non-Acute Institutional Stay (SKILLED_NURSING_FACILITY): Payer: Medicare Other | Admitting: Internal Medicine

## 2021-07-30 DIAGNOSIS — E785 Hyperlipidemia, unspecified: Secondary | ICD-10-CM

## 2021-07-30 DIAGNOSIS — E1151 Type 2 diabetes mellitus with diabetic peripheral angiopathy without gangrene: Secondary | ICD-10-CM

## 2021-07-30 DIAGNOSIS — E1169 Type 2 diabetes mellitus with other specified complication: Secondary | ICD-10-CM

## 2021-07-30 DIAGNOSIS — E1159 Type 2 diabetes mellitus with other circulatory complications: Secondary | ICD-10-CM | POA: Diagnosis not present

## 2021-07-30 DIAGNOSIS — D649 Anemia, unspecified: Secondary | ICD-10-CM | POA: Diagnosis not present

## 2021-07-30 DIAGNOSIS — I152 Hypertension secondary to endocrine disorders: Secondary | ICD-10-CM

## 2021-07-30 NOTE — Patient Instructions (Signed)
See assessment and plan under each diagnosis in the problem list and acutely for this visit 

## 2021-07-30 NOTE — Progress Notes (Signed)
NURSING HOME LOCATION:  [facility] ROOM NUMBER:  115 W  CODE STATUS:  Full  PCP: Synthia Innocent NP  This is a nursing facility follow up visit  of chronic medical diagnoses & to document compliance with Regulation 483.30 (c) in The Long Term Care Survey Manual Phase 2 which mandates caregiver visit ( visits can alternate among physician, PA or NP as per statutes) within 10 days of 30 days / 60 days/ 90 days post admission to SNF date    Interim medical record and care since last SNF visit was updated with review of diagnostic studies and change in clinical status since last visit were documented.  HPI: She is a permanent resident of this facility with medical diagnoses of GERD, dyslipidemia, essential hypertension, diabetes with peripheral neuropathy, and history of small bowel obstruction. Most recent labs revealed a mild but progressive anemia.  H/H was 11.5/37.2 with prior values of 12.7/40.9.  No bleeding dyscrasias reported by staff. Here at the facility glucoses range from 131 up to 167.  A1c is due but the one performed in April revealed an A1c of 7.5% which is at goal of less than 8%.  LDL was at goal of 70.  Review of systems: She comments that she is doing "a lot better" this is in reference to her UTI for which she is receiving ceftriaxone IM through today.  Prior to the antibiotics she describes "everything coming out, pressure" referring to the pubic area.  She describes intermittent left lower extremity sciatica as well as bilateral peripheral neuropathy with numbness and tingling, worse on the left.  She has no bleeding dyscrasias and has no significant diabetic related symptoms.  Constitutional: No fever, significant weight change, fatigue  Eyes: No redness, discharge, pain, vision change ENT/mouth: No nasal congestion,  purulent discharge, earache, change in hearing, sore throat  Cardiovascular: No chest pain, palpitations, paroxysmal nocturnal dyspnea,  claudication, edema  Respiratory: No cough, sputum production, hemoptysis, DOE, significant snoring, apnea   Gastrointestinal: No heartburn, dysphagia, abdominal pain, nausea /vomiting, rectal bleeding, melena, change in bowels Genitourinary: No hematuria, pyuria, incontinence, nocturia Musculoskeletal: No joint stiffness, joint swelling, weakness, pain Dermatologic: No rash, pruritus, change in appearance of skin Neurologic: No dizziness, headache, syncope, seizures Psychiatric: No significant anxiety, depression, insomnia, anorexia Endocrine: No change in hair/skin/nails, excessive thirst, excessive hunger, excessive urination  Hematologic/lymphatic: No significant bruising, lymphadenopathy, abnormal bleeding Allergy/immunology: No itchy/watery eyes, significant sneezing, urticaria, angioedema  Physical exam:  Pertinent or positive findings: She is a delightful individual,communicative and joyful.  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  She has only 3 lower teeth remaining and 2 are densely plaqued.  Central obesity is present.  Pedal pulses are decreased; essentially only the right dorsalis pedis pulse is palpable.  There is some lymphedema and slight edema suggested without pitting.  The right great toenail is absent.  General appearance: no acute distress, increased work of breathing is present.   Lymphatic: No lymphadenopathy about the head, neck, axilla. Eyes: No conjunctival inflammation or lid edema is present. There is no scleral icterus. Ears:  External ear exam shows no significant lesions or deformities.   Nose:  External nasal examination shows no deformity or inflammation. Nasal mucosa are pink and moist without lesions, exudates Neck:  No thyromegaly, masses, tenderness noted.    Heart:  Normal rate and regular  rhythm. S1 and S2 normal without gallop, murmur, click,  rub .  Lungs: Chest clear to auscultation without wheezes, rhonchi, rales, rubs. Abdomen: Bowel sounds are normal. Abdomen is soft and nontender with no organomegaly, hernias, masses. GU: Deferred  Extremities:  No cyanosis, clubbing  Neurologic exam :Balance, Rhomberg, finger to nose testing could not be completed due to clinical state Skin: Warm & dry w/o tenting. No significant lesions or rash.  See summary under each active problem in the Problem List with associated updated therapeutic plan

## 2021-07-30 NOTE — Assessment & Plan Note (Signed)
Current H/H 11.5/37.2; prior H/H 12.7/40.9 Indices normocytic/normochromic Anemia new  ROS negative for bleeding dyscrasias & none reported by Staff Monitor CBC

## 2021-07-30 NOTE — Assessment & Plan Note (Addendum)
DM with neuro, renal  & vascular complications. Creat 0.89/GFR > 60, Stage 2 Glucose range  @ SNF:151-167 Current A1c: 7.5% A1c goal : < 8% No hypoglycemia No change indicated

## 2021-07-30 NOTE — Assessment & Plan Note (Signed)
LDL @ goal of 70; no change indicated.

## 2021-07-30 NOTE — Assessment & Plan Note (Addendum)
Systolic blood pressure is mildly elevated today and she has suggestion of nonpitting edema.  A can of [employer] potato chips was by her bedside.  I discussed sodium restriction with her.  Although the NP states that she has had the same discussion; the patient acted as if this were a new revelation. Continue BP monitor & adjust regimen if average > 140 systolic

## 2021-08-06 ENCOUNTER — Encounter (HOSPITAL_COMMUNITY)
Admission: RE | Admit: 2021-08-06 | Discharge: 2021-08-06 | Disposition: A | Discharge status: Home / Self Care | Payer: Medicare Other | Source: Skilled Nursing Facility | Attending: Adult Health | Admitting: Adult Health

## 2021-08-06 DIAGNOSIS — N3281 Overactive bladder: Secondary | ICD-10-CM | POA: Diagnosis not present

## 2021-08-06 LAB — BASIC METABOLIC PANEL WITH GFR
Anion gap: 9 (ref 5–15)
BUN: 20 mg/dL (ref 8–23)
CO2: 27 mmol/L (ref 22–32)
Calcium: 8.9 mg/dL (ref 8.9–10.3)
Chloride: 101 mmol/L (ref 98–111)
Creatinine, Ser: 0.97 mg/dL (ref 0.44–1.00)
GFR, Estimated: 59 mL/min — ABNORMAL LOW
Glucose, Bld: 139 mg/dL — ABNORMAL HIGH (ref 70–99)
Potassium: 3.8 mmol/L (ref 3.5–5.1)
Sodium: 137 mmol/L (ref 135–145)

## 2021-08-06 LAB — MAGNESIUM: Magnesium: 1.9 mg/dL (ref 1.7–2.4)

## 2021-08-23 ENCOUNTER — Non-Acute Institutional Stay (SKILLED_NURSING_FACILITY): Payer: Medicare Other | Admitting: Adult Health

## 2021-08-23 ENCOUNTER — Encounter: Payer: Self-pay | Admitting: Adult Health

## 2021-08-23 DIAGNOSIS — I700 Atherosclerosis of aorta: Secondary | ICD-10-CM

## 2021-08-23 DIAGNOSIS — E1169 Type 2 diabetes mellitus with other specified complication: Secondary | ICD-10-CM | POA: Diagnosis not present

## 2021-08-23 DIAGNOSIS — E1151 Type 2 diabetes mellitus with diabetic peripheral angiopathy without gangrene: Secondary | ICD-10-CM | POA: Diagnosis not present

## 2021-08-23 DIAGNOSIS — E785 Hyperlipidemia, unspecified: Secondary | ICD-10-CM

## 2021-08-23 DIAGNOSIS — E1159 Type 2 diabetes mellitus with other circulatory complications: Secondary | ICD-10-CM | POA: Diagnosis not present

## 2021-08-23 DIAGNOSIS — I152 Hypertension secondary to endocrine disorders: Secondary | ICD-10-CM

## 2021-08-23 NOTE — Progress Notes (Signed)
Location:  [facility] Room Number: 115-W Place of Service:  SNF (31)   CODE STATUS: Full code  Allergies  Allergen Reactions   Lisinopril    Penicillins Nausea Only    Has patient had a PCN reaction causing immediate rash, facial/tongue/throat swelling, SOB or lightheadedness with hypotension: Yes Has patient had a PCN reaction causing severe rash involving mucus membranes or skin necrosis: No Has patient had a PCN reaction that required hospitalization No Has patient had a PCN reaction occurring within the last 10 years: No If all of the above answers are "NO", then may proceed with Cephalosporin use.    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 There are no reports of uncontrolled pain. There are no reports of changes in appetite; no reports of anxiety or depressive thoughts.   Past Medical History:  Diagnosis Date   Anxiety    Bowel obstruction (HCC)    Burning with urination 01/04/2016   Diabetes mellitus without complication (HCC)    Genital herpes    GERD (gastroesophageal reflux disease)    Hematuria 01/04/2016   Herpes 12/22/2015   Hyperlipidemia    Hypertension    LLQ abdominal tenderness 01/04/2016   Neuropathy    Pelvic pressure in female 01/04/2016   Recurrent UTI    Sciatic nerve disease    Small bowel obstruction (HCC) 04/27/2016   Urinary frequency 01/04/2016    Urticaria     Past Surgical History:  Procedure Laterality Date   ABDOMINAL HYSTERECTOMY     APPLICATION OF INTRAOPERATIVE CT SCAN N/A 12/19/2018   Procedure: APPLICATION OF INTRAOPERATIVE CT SCAN;  Surgeon: Maeola Harman, MD;  Location: [facility] OR;  Service: Neurosurgery;  Laterality: N/A;   bowel obstruction     COLON SURGERY     blocked colon   POSTERIOR CERVICAL FUSION/FORAMINOTOMY N/A 12/19/2018   Procedure: Cervical Seven to Thoracic One Posterior cervical fusion;  Surgeon: Maeola Harman, MD;  Location: [facility] OR;  Service: Neurosurgery;  Laterality: N/A;  Cervical Seven to Thoracic One Posterior cervical fusion    Social History   Socioeconomic History   Marital status: Widowed    Spouse name: Not on file   Number of children: Not on file   Years of education: Not on file   Highest education level: Not on file  Occupational History   Occupation: retired   Tobacco Use   Smoking status: Never   Smokeless tobacco: Current    Types: Snuff  Vaping Use   Vaping Use: Never used  Substance and Sexual Activity   Alcohol use: No   Drug use: No   Sexual activity: Not Currently    Birth control/protection: Surgical    Comment: hyst  Other Topics Concern   Not on file  Social History Narrative   Long term resident of [facility]    Social Determinants of Health   Financial  Resource Strain: Not on file  Food Insecurity: Not on file  Transportation Needs: Not on file  Physical Activity: Not on file  Stress: Not on file  Social Connections: Not on file  Intimate Partner Violence: Not on file   Family History  Problem Relation Age of Onset   Diabetes Mother    Heart disease Brother    Hypertension Brother    Diabetes Brother    Hypertension Daughter    Diabetes Brother    Diabetes Brother    Alzheimer's disease Brother    Anxiety disorder Daughter       VITAL SIGNS BP (!) 157/78   Pulse 78   Temp 98.1 F (36.7 C)   Resp 16   Ht 5' (1.524 m)   Wt 191 lb (86.6 kg)   SpO2  98%   BMI 37.30 kg/m   Outpatient Encounter Medications as of 08/23/2021  Medication Sig   acetaminophen (TYLENOL) 650 MG CR tablet Take 650 mg by mouth every 6 (six) hours.   amLODipine (NORVASC) 10 MG tablet Take 1 tablet (10 mg total) by mouth daily.   aspirin 81 MG chewable tablet Chew 81 mg by mouth daily.   Cranberry 500 MG CAPS Take by mouth in the morning and at bedtime. For UTI prevention   cyanocobalamin 1000 MCG tablet Take 1,000 mcg by mouth daily. Special Instructions: for vit B 12 deficiency   diclofenac Sodium (VOLTAREN) 1 % GEL Apply 2 g topically 2 (two) times daily as needed.   DULoxetine (CYMBALTA) 20 MG capsule Take 20 mg by mouth 2 (two) times daily.   lamoTRIgine (LAMICTAL) 25 MG tablet Take 25 mg by mouth 2 (two) times daily.    linagliptin (TRADJENTA) 5 MG TABS tablet Take 5 mg by mouth daily.   loratadine (CLARITIN) 10 MG tablet Take 10 mg by mouth daily.   losartan (COZAAR) 25 MG tablet Take 12.5 mg by mouth daily.   Melatonin 5 MG TABS Take 5 mg by mouth at bedtime.   metFORMIN (GLUCOPHAGE) 1000 MG tablet Take 1,000 mg by mouth 2 (two) times daily with a meal.    mirabegron ER (MYRBETRIQ) 50 MG TB24 tablet Take 50 mg by mouth daily.   montelukast (SINGULAIR) 10 MG tablet Take 1 tablet (10 mg total) by mouth at bedtime.   NON FORMULARY Diet:  Consistent Carbohydrate,   omeprazole (PRILOSEC) 20 MG capsule Take 20 mg by mouth daily. Special Instructions: TAKE 1 CAPSULE BY MOUTH EVERY MORNING *TAKE ON AN EMPTY STOMACH* *DO NOT CRUSH* (FORMULARY SUB FOR   polyethylene glycol (MIRALAX / GLYCOLAX) packet Take 17 g by mouth daily.   senna-docusate (SENOKOT-S) 8.6-50 MG tablet Take 1 tablet by mouth at bedtime.    simvastatin (ZOCOR) 10 MG tablet Take 10 mg by mouth at bedtime.    triamcinolone (NASACORT) 55 MCG/ACT AERO nasal inhaler Place 1 spray into the nose daily.   valACYclovir (VALTREX) 500 MG tablet Take 500 mg by mouth daily.    [DISCONTINUED] azithromycin  (ZITHROMAX) 500 MG tablet Take by mouth daily. for e-coli UTI   No facility-administered encounter medications on file as of 08/23/2021.     SIGNIFICANT DIAGNOSTIC EXAMS   PREVIOUS  07-16-20 DEXA: t score 0.825   NO NEW EXAMS.   LABS REVIEWED PREVIOUS;    08-27-20: hepatitis C: neg 08-31-20: urine culture: multiple species 09-21-20: urine culture: e-coli: cipro  10-01-20: wbc 14.8; hgb 13.5; hct 43.3; mcv 87.7 plt 240; glucose 146; bun 19; creat 1.05;  k+ 4.4; na++ 136; ca 9.7 vitamin B 12: 877; folate 13.9; tsh 4.543 11-16-20: wbc 11.7; hgb 12.7; hct 40.9; mcv 887 plt 213 urine culture: klebsiella pneumoniae: cipro 12-19-20: urine culture: e-coli 12-30-20: hgb a1c 6.9  02-26-21: urine culture: 80,000 e-coli 03-18-21: hgb a1c 7.5 chol 169; ldl 70; trig 222; hdl 55 04-05-21:wbc 8.6; hgb 11.5; hct 37.2; mcv 87.7 plt 184; glucose 180; bun 18; creat 0.85; k+ 3.7; na++ 135; ca 8.9; GFR>60; liver normal albumin 3.6 04-08-21: glucose 148; bun 22; creat 0.89; k+ 3.7; na++ 137; ca 8.8; GFR >60; urine micro-albumin <0.3  07-23-21: urine culture: e-coli rocephin  TODAY  08-06-21: glucose 139; bun 20; creat 0.97; k+ 3.8; na++ 137; ca 8.9; GFR 59; mag 1.9   Review of Systems  Constitutional:  Negative for malaise/fatigue.  Respiratory:  Negative for cough and shortness of breath.   Cardiovascular:  Negative for chest pain, palpitations and leg swelling.  Gastrointestinal:  Negative for abdominal pain, constipation and heartburn.  Musculoskeletal:  Negative for back pain, joint pain and myalgias.  Skin: Negative.   Neurological:  Negative for dizziness.  Psychiatric/Behavioral:  The patient is not nervous/anxious.    Physical Exam Constitutional:      General: She is not in acute distress.    Appearance: She is well-developed. She is obese. She is not diaphoretic.  Neck:     Thyroid: No thyromegaly.  Cardiovascular:     Rate and Rhythm: Normal rate and regular rhythm.     Pulses: Normal pulses.      Heart sounds: Normal heart sounds.  Pulmonary:     Effort: Pulmonary effort is normal. No respiratory distress.     Breath sounds: Normal breath sounds.  Abdominal:     General: Bowel sounds are normal. There is no distension.     Palpations: Abdomen is soft.     Tenderness: There is no abdominal tenderness.  Musculoskeletal:        General: Normal range of motion.     Cervical back: Neck supple.     Right lower leg: No edema.     Left lower leg: No edema.  Lymphadenopathy:     Cervical: No cervical adenopathy.  Skin:    General: Skin is warm and dry.  Neurological:     Mental Status: She is alert. Mental status is at baseline.  Psychiatric:        Mood and Affect: Mood normal.     ASSESSMENT/ PLAN:  TODAY  Hypertension associated with type 2 diabetes mellitus: is stable b/p 157/78 will continue norvasc 10 mg daily asa 81 mg daily   2. Dyslipidemia associated with type 2 diabetes mellitus: is stable LDL 70 will continue zocor 10 mg daily   3. Aortic atherosclerosis (ct 12-18-19) will monitor  4. 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 Gastroesophageal reflux disease without esophagitis: is stable will continue prilosec 20 mg daily   6. OAB: is stable will continue myrbetrig 50 mg daily   7 Chronic generalized pain: is stable will continue tylenol cr 650 mg every 6 hours voltaren gel 2 gm to both knees twice daily as needed   8. Chronic non-seasonal allergic rhinitis is stable will continue claritin 10 mg daily nasocort daily and singulair 10 mg daily   9. Herpes: is stable no recent outbreaks; will continue valtrex 500  mg daily  10. Vitamin B 12 deficiency: is stable level 877; will continue 1000 mcg daily   11. Chronic constipation: stable will continue miralax daily and senna s daily   12. Major depression recurrent chronic: is  stable will continue cymbalta 20 mg twice daily (failed one wean) lamictal 25 mg twice daily for mood and melatonin 5 mg nightly for sleep.       Synthia Innocenteborah Eugina Row NP [facility]  Contact [phone] Monday through Friday 8am- 5pm  After hours call [phone]

## 2021-09-22 ENCOUNTER — Encounter: Payer: Self-pay | Admitting: Adult Health

## 2021-09-22 NOTE — Progress Notes (Signed)
Location:  [facility] Room Number: 136-P Place of Service:  SNF (31)   CODE STATUS: Full Code   Allergies  Allergen Reactions   Lisinopril    Penicillins Nausea Only    Has patient had a PCN reaction causing immediate rash, facial/tongue/throat swelling, SOB or lightheadedness with hypotension: Yes Has patient had a PCN reaction causing severe rash involving mucus membranes or skin necrosis: No Has patient had a PCN reaction that required hospitalization No Has patient had a PCN reaction occurring within the last 10 years: No If all of the above answers are "NO", then may proceed with Cephalosporin use.    Sulfa Antibiotics Nausea And Vomiting    Chief Complaint  Patient presents with   Medical Management of Chronic Issues    Routine visit. Discuss need for Td/tdap, shingrix, coivd #4, eye exam and A1c or exclude.     HPI:    Past Medical History:  Diagnosis Date   Anxiety    Bowel obstruction (HCC)    Burning with urination 01/04/2016   Diabetes mellitus without complication (HCC)    Genital herpes    GERD (gastroesophageal reflux disease)    Hematuria 01/04/2016   Herpes 12/22/2015   Hyperlipidemia    Hypertension    LLQ abdominal tenderness 01/04/2016   Neuropathy    Pelvic pressure in female 01/04/2016   Recurrent UTI    Sciatic nerve disease    Small bowel obstruction (HCC) 04/27/2016   Urinary frequency 01/04/2016   Urticaria     Past Surgical History:  Procedure Laterality Date   ABDOMINAL HYSTERECTOMY     APPLICATION OF INTRAOPERATIVE CT SCAN N/A 12/19/2018   Procedure: APPLICATION OF INTRAOPERATIVE CT SCAN;  Surgeon: Maeola Harman, MD;  Location: [facility] OR;  Service: Neurosurgery;  Laterality: N/A;   bowel obstruction     COLON SURGERY     blocked colon   POSTERIOR CERVICAL FUSION/FORAMINOTOMY N/A 12/19/2018   Procedure: Cervical Seven to Thoracic One Posterior cervical fusion;  Surgeon: Maeola Harman, MD;  Location: [facility] OR;  Service:  Neurosurgery;  Laterality: N/A;  Cervical Seven to Thoracic One Posterior cervical fusion    Social History   Socioeconomic History   Marital status: Widowed    Spouse name: Not on file   Number of children: Not on file   Years of education: Not on file   Highest education level: Not on file  Occupational History   Occupation: retired   Tobacco Use   Smoking status: Never   Smokeless tobacco: Current    Types: Snuff  Vaping Use   Vaping Use: Never used  Substance and Sexual Activity   Alcohol use: No   Drug use: No   Sexual activity: Not Currently    Birth control/protection: Surgical    Comment: hyst  Other Topics Concern   Not on file  Social History Narrative   Long term resident of [facility]    Social Determinants of Health   Financial Resource Strain: Not on file  Food Insecurity: Not on file  Transportation Needs: Not on file  Physical Activity: Not on file  Stress: Not on file  Social Connections: Not on file  Intimate Partner Violence: Not on file   Family History  Problem Relation Age of Onset   Diabetes Mother    Heart disease Brother    Hypertension Brother    Diabetes Brother    Hypertension Daughter    Diabetes Brother    Diabetes Brother  Alzheimer's disease Brother    Anxiety disorder Daughter       VITAL SIGNS BP (!) 161/81   Pulse 85   Temp 98.2 F (36.8 C)   Resp 16   Ht 5' (1.524 m)   Wt 192 lb 6.4 oz (87.3 kg)   SpO2 98%   BMI 37.58 kg/m   Outpatient Encounter Medications as of 09/22/2021  Medication Sig   acetaminophen (TYLENOL) 650 MG CR tablet Take 650 mg by mouth every 8 (eight) hours.   amLODipine (NORVASC) 10 MG tablet Take 1 tablet (10 mg total) by mouth daily.   aspirin 81 MG chewable tablet Chew 81 mg by mouth daily.   Cranberry 500 MG CAPS Take by mouth in the morning and at bedtime. For UTI prevention   cyanocobalamin 1000 MCG tablet Take 1,000 mcg by mouth daily. Special Instructions: for vit B 12 deficiency    diclofenac Sodium (VOLTAREN) 1 % GEL Apply 2 g topically 2 (two) times daily as needed.   DULoxetine (CYMBALTA) 20 MG capsule Take 20 mg by mouth 2 (two) times daily.   lamoTRIgine (LAMICTAL) 25 MG tablet Take 25 mg by mouth 2 (two) times daily.    linagliptin (TRADJENTA) 5 MG TABS tablet Take 5 mg by mouth daily.   loratadine (CLARITIN) 10 MG tablet Take 10 mg by mouth daily.   losartan (COZAAR) 25 MG tablet Take 12.5 mg by mouth daily.   Melatonin 5 MG TABS Take 5 mg by mouth at bedtime.   metFORMIN (GLUCOPHAGE) 1000 MG tablet Take 1,000 mg by mouth 2 (two) times daily with a meal.    mirabegron ER (MYRBETRIQ) 50 MG TB24 tablet Take 50 mg by mouth daily.   montelukast (SINGULAIR) 10 MG tablet Take 1 tablet (10 mg total) by mouth at bedtime.   NON FORMULARY Diet:  Consistent Carbohydrate,   omeprazole (PRILOSEC) 20 MG capsule Take 20 mg by mouth daily. Special Instructions: TAKE 1 CAPSULE BY MOUTH EVERY MORNING *TAKE ON AN EMPTY STOMACH* *DO NOT CRUSH* (FORMULARY SUB FOR   polyethylene glycol (MIRALAX / GLYCOLAX) packet Take 17 g by mouth daily.   senna-docusate (SENOKOT-S) 8.6-50 MG tablet Take 1 tablet by mouth at bedtime.    simvastatin (ZOCOR) 10 MG tablet Take 10 mg by mouth at bedtime.    triamcinolone (NASACORT) 55 MCG/ACT AERO nasal inhaler Place 1 spray into the nose daily.   valACYclovir (VALTREX) 500 MG tablet Take 500 mg by mouth daily.    No facility-administered encounter medications on file as of 09/22/2021.     SIGNIFICANT DIAGNOSTIC EXAMS       ASSESSMENT/ PLAN:     Synthia Innocent NP [facility]  Contact [phone] Monday through Friday 8am- 5pm  After hours call [phone]

## 2021-09-23 NOTE — Progress Notes (Signed)
This encounter was created in error - please disregard.

## 2021-09-27 ENCOUNTER — Encounter: Payer: Self-pay | Admitting: Adult Health

## 2021-09-27 ENCOUNTER — Non-Acute Institutional Stay (SKILLED_NURSING_FACILITY): Payer: Medicare Other | Admitting: Adult Health

## 2021-09-27 DIAGNOSIS — K219 Gastro-esophageal reflux disease without esophagitis: Secondary | ICD-10-CM

## 2021-09-27 DIAGNOSIS — N3281 Overactive bladder: Secondary | ICD-10-CM

## 2021-09-27 DIAGNOSIS — R52 Pain, unspecified: Secondary | ICD-10-CM

## 2021-09-27 DIAGNOSIS — J3089 Other allergic rhinitis: Secondary | ICD-10-CM

## 2021-09-27 DIAGNOSIS — G8929 Other chronic pain: Secondary | ICD-10-CM

## 2021-09-27 NOTE — Progress Notes (Signed)
Location:  [facility] Room Number: 115 W Place of Service:  SNF (31)   CODE STATUS: Full CODE  Allergies  Allergen Reactions   Lisinopril    Penicillins Nausea Only    Has patient had a PCN reaction causing immediate rash, facial/tongue/throat swelling, SOB or lightheadedness with hypotension: Yes Has patient had a PCN reaction causing severe rash involving mucus membranes or skin necrosis: No Has patient had a PCN reaction that required hospitalization No Has patient had a PCN reaction occurring within the last 10 years: No If all of the above answers are "NO", then may proceed with Cephalosporin use.    Sulfa Antibiotics Nausea And Vomiting    Chief Complaint  Patient presents with   Medical Management of Chronic Issues        Gastroesophageal reflux disease without esophagitis:   OAB:  Chronic generalized pain:   Chronic non-seasonal allergic rhinitis:    HPI:  She is a 80 year old long term resident of this facility being seen for the management of her chronic illnesses: Gastroesophageal reflux disease without esophagitis:   OAB:  Chronic generalized pain:   Chronic non-seasonal allergic rhinitis. There are no reports of uncontrolled pain; no changes in appetite; no reports of sinus congestion.   Past Medical History:  Diagnosis Date   Anxiety    Bowel obstruction (HCC)    Burning with urination 01/04/2016   Diabetes mellitus without complication (HCC)    Genital herpes    GERD (gastroesophageal reflux disease)    Hematuria 01/04/2016   Herpes 12/22/2015   Hyperlipidemia    Hypertension    LLQ abdominal tenderness 01/04/2016   Neuropathy    Pelvic pressure in female 01/04/2016   Recurrent UTI    Sciatic nerve disease    Small bowel obstruction (HCC) 04/27/2016   Urinary frequency 01/04/2016   Urticaria     Past Surgical History:  Procedure Laterality Date   ABDOMINAL HYSTERECTOMY     APPLICATION OF INTRAOPERATIVE CT SCAN N/A 12/19/2018    Procedure: APPLICATION OF INTRAOPERATIVE CT SCAN;  Surgeon: Maeola Harman, MD;  Location: [facility] OR;  Service: Neurosurgery;  Laterality: N/A;   bowel obstruction     COLON SURGERY     blocked colon   POSTERIOR CERVICAL FUSION/FORAMINOTOMY N/A 12/19/2018   Procedure: Cervical Seven to Thoracic One Posterior cervical fusion;  Surgeon: Maeola Harman, MD;  Location: [facility] OR;  Service: Neurosurgery;  Laterality: N/A;  Cervical Seven to Thoracic One Posterior cervical fusion    Social History   Socioeconomic History   Marital status: Widowed    Spouse name: Not on file   Number of children: Not on file   Years of education: Not on file   Highest education level: Not on file  Occupational History   Occupation: retired   Tobacco Use   Smoking status: Never   Smokeless tobacco: Current    Types: Snuff  Vaping Use   Vaping Use: Never used  Substance and Sexual Activity   Alcohol use: No   Drug use: No   Sexual activity: Not Currently    Birth control/protection: Surgical    Comment: hyst  Other Topics Concern   Not on file  Social History Narrative   Long term resident of [facility]    Social Determinants of Health   Financial Resource Strain: Not on file  Food Insecurity: Not on file  Transportation Needs: Not on file  Physical Activity: Not on file  Stress: Not on  file  Social Connections: Not on file  Intimate Partner Violence: Not on file   Family History  Problem Relation Age of Onset   Diabetes Mother    Heart disease Brother    Hypertension Brother    Diabetes Brother    Hypertension Daughter    Diabetes Brother    Diabetes Brother    Alzheimer's disease Brother    Anxiety disorder Daughter       VITAL SIGNS BP (!) 153/83   Pulse 78   Temp 98.6 F (37 C)   Resp 16   Ht 5' (1.524 m)   Wt 192 lb 6.4 oz (87.3 kg)   SpO2 98%   BMI 37.58 kg/m   Outpatient Encounter Medications as of 09/27/2021  Medication Sig   acetaminophen (TYLENOL) 650 MG CR tablet Take 650 mg  by mouth every 8 (eight) hours.   amLODipine (NORVASC) 10 MG tablet Take 1 tablet (10 mg total) by mouth daily.   aspirin 81 MG chewable tablet Chew 81 mg by mouth daily.   Cranberry 500 MG CAPS Take by mouth in the morning and at bedtime. For UTI prevention   cyanocobalamin 1000 MCG tablet Take 1,000 mcg by mouth daily. Special Instructions: for vit B 12 deficiency   diclofenac Sodium (VOLTAREN) 1 % GEL Apply 2 g topically 2 (two) times daily as needed.   DULoxetine (CYMBALTA) 20 MG capsule Take 20 mg by mouth 2 (two) times daily.   lamoTRIgine (LAMICTAL) 25 MG tablet Take 25 mg by mouth 2 (two) times daily.    linagliptin (TRADJENTA) 5 MG TABS tablet Take 5 mg by mouth daily.   loratadine (CLARITIN) 10 MG tablet Take 10 mg by mouth daily.   losartan (COZAAR) 25 MG tablet Take 12.5 mg by mouth daily.   Melatonin 5 MG TABS Take 5 mg by mouth at bedtime.   metFORMIN (GLUCOPHAGE) 1000 MG tablet Take 1,000 mg by mouth 2 (two) times daily with a meal.    mirabegron ER (MYRBETRIQ) 50 MG TB24 tablet Take 50 mg by mouth daily.   montelukast (SINGULAIR) 10 MG tablet Take 1 tablet (10 mg total) by mouth at bedtime.   NON FORMULARY Diet:  Consistent Carbohydrate,   omeprazole (PRILOSEC) 20 MG capsule Take 20 mg by mouth daily. Special Instructions: TAKE 1 CAPSULE BY MOUTH EVERY MORNING *TAKE ON AN EMPTY STOMACH* *DO NOT CRUSH* (FORMULARY SUB FOR   polyethylene glycol (MIRALAX / GLYCOLAX) packet Take 17 g by mouth daily.   senna-docusate (SENOKOT-S) 8.6-50 MG tablet Take 1 tablet by mouth at bedtime.    simvastatin (ZOCOR) 10 MG tablet Take 10 mg by mouth at bedtime.    triamcinolone (NASACORT) 55 MCG/ACT AERO nasal inhaler Place 1 spray into the nose daily.   valACYclovir (VALTREX) 500 MG tablet Take 500 mg by mouth daily.    No facility-administered encounter medications on file as of 09/27/2021.     SIGNIFICANT DIAGNOSTIC EXAMS  PREVIOUS  07-16-20 DEXA: t score 0.825   NO NEW EXAMS.   LABS  REVIEWED PREVIOUS;    09-21-20: urine culture: e-coli: cipro  10-01-20: wbc 14.8; hgb 13.5; hct 43.3; mcv 87.7 plt 240; glucose 146; bun 19; creat 1.05; k+ 4.4; na++ 136; ca 9.7 vitamin B 12: 877; folate 13.9; tsh 4.543 11-16-20: wbc 11.7; hgb 12.7; hct 40.9; mcv 887 plt 213 urine culture: klebsiella pneumoniae: cipro 12-19-20: urine culture: e-coli 12-30-20: hgb a1c 6.9  02-26-21: urine culture: 80,000 e-coli 03-18-21: hgb a1c 7.5 chol 169;  ldl 70; trig 222; hdl 55 04-05-21:wbc 8.6; hgb 11.5; hct 37.2; mcv 87.7 plt 184; glucose 180; bun 18; creat 0.85; k+ 3.7; na++ 135; ca 8.9; GFR>60; liver normal albumin 3.6 04-08-21: glucose 148; bun 22; creat 0.89; k+ 3.7; na++ 137; ca 8.8; GFR >60; urine micro-albumin <0.3  07-23-21: urine culture: e-coli rocephin 08-06-21: glucose 139; bun 20; creat 0.97; k+ 3.8; na++ 137; ca 8.9; GFR 59; mag 1.9   NO NEW LABS.   Review of Systems  Constitutional:  Negative for malaise/fatigue.  Respiratory:  Negative for cough and shortness of breath.   Cardiovascular:  Negative for chest pain, palpitations and leg swelling.  Gastrointestinal:  Negative for abdominal pain, constipation and heartburn.  Musculoskeletal:  Negative for back pain, joint pain and myalgias.  Skin: Negative.   Neurological:  Negative for dizziness.  Psychiatric/Behavioral:  The patient is not nervous/anxious.     Physical Exam Constitutional:      General: She is not in acute distress.    Appearance: She is well-developed. She is not diaphoretic.  Neck:     Thyroid: No thyromegaly.  Cardiovascular:     Rate and Rhythm: Normal rate and regular rhythm.     Heart sounds: Normal heart sounds.  Pulmonary:     Effort: Pulmonary effort is normal. No respiratory distress.     Breath sounds: Normal breath sounds.  Abdominal:     General: Bowel sounds are normal. There is no distension.     Palpations: Abdomen is soft.     Tenderness: There is no abdominal tenderness.  Musculoskeletal:         General: Normal range of motion.     Cervical back: Neck supple.     Right lower leg: No edema.     Left lower leg: No edema.  Lymphadenopathy:     Cervical: No cervical adenopathy.  Skin:    General: Skin is warm and dry.  Neurological:     Mental Status: She is alert and oriented to person, place, and time.      ASSESSMENT/ PLAN:  TODAY  Gastroesophageal reflux disease without esophagitis: is stable will continue prilosec 20 mg daily   2. OAB: is stable will continue myrbetriq 50 mg daily  3. Chronic generalized pain: is stable will continue tylenol cr 650 mg every 8 hours voltaren gel 2 gm to both knees twice daily as needed   4. Chronic non-seasonal allergic rhinitis: is stable will continue claritin 10 mg daily; singulair 10 mg daily and nasocort daily    PREVIOUS   5. Herpes: is stable no recent outbreaks; will continue valtrex 500 mg daily  6. Vitamin B 12 deficiency: is stable level 877; will continue 1000 mcg daily   7. Chronic constipation: stable will continue miralax daily and senna s daily   8. Major depression recurrent chronic: is stable will continue cymbalta 20 mg twice daily (failed one wean) lamictal 25 mg twice daily for mood and melatonin 5 mg nightly for sleep.   9. Hypertension associated with type 2 diabetes mellitus: is stable b/p 153/83 will continue norvasc 10 mg daily asa 81 mg daily   10. Dyslipidemia associated with type 2 diabetes mellitus: is stable LDL 70 will continue zocor 10 mg daily   12. Aortic atherosclerosis (ct 12-18-19) will monitor  13. 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

## 2021-09-30 ENCOUNTER — Encounter (HOSPITAL_COMMUNITY)
Admission: RE | Admit: 2021-09-30 | Discharge: 2021-09-30 | Disposition: A | Discharge status: Home / Self Care | Payer: Medicare Other | Source: Skilled Nursing Facility | Attending: Adult Health | Admitting: Adult Health

## 2021-09-30 DIAGNOSIS — E1142 Type 2 diabetes mellitus with diabetic polyneuropathy: Secondary | ICD-10-CM | POA: Insufficient documentation

## 2021-09-30 LAB — CBC
HCT: 36.2 % (ref 36.0–46.0)
Hemoglobin: 11.2 g/dL — ABNORMAL LOW (ref 12.0–15.0)
MCH: 26.4 pg (ref 26.0–34.0)
MCHC: 30.9 g/dL (ref 30.0–36.0)
MCV: 85.4 fL (ref 80.0–100.0)
Platelets: 207 10*3/uL (ref 150–400)
RBC: 4.24 MIL/uL (ref 3.87–5.11)
RDW: 15 % (ref 11.5–15.5)
WBC: 9.4 10*3/uL (ref 4.0–10.5)
nRBC: 0 % (ref 0.0–0.2)

## 2021-09-30 LAB — VITAMIN B12: Vitamin B-12: 1068 pg/mL — ABNORMAL HIGH (ref 180–914)

## 2021-09-30 LAB — LIPID PANEL
Cholesterol: 124 mg/dL (ref 0–200)
HDL: 43 mg/dL (ref >40)
LDL Cholesterol: 50 mg/dL (ref 0–99)
Total CHOL/HDL Ratio: 2.9 RATIO
Triglycerides: 154 mg/dL — ABNORMAL HIGH (ref <150)
VLDL: 31 mg/dL (ref 0–40)

## 2021-09-30 LAB — COMPREHENSIVE METABOLIC PANEL
ALT: 17 U/L (ref 0–44)
AST: 15 U/L (ref 15–41)
Albumin: 3.6 g/dL (ref 3.5–5.0)
Alkaline Phosphatase: 70 U/L (ref 38–126)
Anion gap: 9 (ref 5–15)
BUN: 18 mg/dL (ref 8–23)
CO2: 27 mmol/L (ref 22–32)
Calcium: 8.7 mg/dL — ABNORMAL LOW (ref 8.9–10.3)
Chloride: 100 mmol/L (ref 98–111)
Creatinine, Ser: 0.88 mg/dL (ref 0.44–1.00)
GFR, Estimated: >60 mL/min (ref >60)
Glucose, Bld: 153 mg/dL — ABNORMAL HIGH (ref 70–99)
Potassium: 3.6 mmol/L (ref 3.5–5.1)
Sodium: 136 mmol/L (ref 135–145)
Total Bilirubin: 0.2 mg/dL — ABNORMAL LOW (ref 0.3–1.2)
Total Protein: 6.5 g/dL (ref 6.5–8.1)

## 2021-09-30 LAB — HEMOGLOBIN A1C
Hgb A1c MFr Bld: 7.2 % — ABNORMAL HIGH (ref 4.8–5.6)
Mean Plasma Glucose: 159.94 mg/dL

## 2021-10-01 ENCOUNTER — Encounter: Payer: Self-pay | Admitting: Adult Health

## 2021-10-01 ENCOUNTER — Non-Acute Institutional Stay (SKILLED_NURSING_FACILITY): Payer: Medicare Other | Admitting: Adult Health

## 2021-10-01 DIAGNOSIS — F339 Major depressive disorder, recurrent, unspecified: Secondary | ICD-10-CM

## 2021-10-01 DIAGNOSIS — E1122 Type 2 diabetes mellitus with diabetic chronic kidney disease: Secondary | ICD-10-CM

## 2021-10-01 DIAGNOSIS — I700 Atherosclerosis of aorta: Secondary | ICD-10-CM

## 2021-10-01 DIAGNOSIS — E1151 Type 2 diabetes mellitus with diabetic peripheral angiopathy without gangrene: Secondary | ICD-10-CM | POA: Diagnosis not present

## 2021-10-01 DIAGNOSIS — N183 Chronic kidney disease, stage 3 unspecified: Secondary | ICD-10-CM

## 2021-10-01 NOTE — Progress Notes (Signed)
Location:  [facility] Room Number: 115 Place of Service:  SNF (31)   CODE STATUS: full code   Allergies  Allergen Reactions   Lisinopril    Penicillins Nausea Only    Has patient had a PCN reaction causing immediate rash, facial/tongue/throat swelling, SOB or lightheadedness with hypotension: Yes Has patient had a PCN reaction causing severe rash involving mucus membranes or skin necrosis: No Has patient had a PCN reaction that required hospitalization No Has patient had a PCN reaction occurring within the last 10 years: No If all of the above answers are "NO", then may proceed with Cephalosporin use.    Sulfa Antibiotics Nausea And Vomiting    Chief Complaint  Patient presents with   Acute Visit    Care plan meeting     HPI:  We have come together for her care plan meeting. No BIMS (out on LOA); mood 1/30: little interest/pleasure doing things. She is independent to supervision with her adls. She is continent of bladder and bowel. There have been no falls. She has experienced deaths in her family over this past year. Dietary: weight is 192 pounds she is on CON CHO diet has a good appetite; is able to feed herself. Therapy: OT for shoulder pain management. She continues to be followed for her chronic illnesses including:   Aortic atherosclerosis  Type 2 diabetes mellitus with peripheral angiography  CKD stage 3 due to type 2 diabetes mellitus  Major depression chronic recurrent  Past Medical History:  Diagnosis Date   Anxiety    Bowel obstruction (HCC)    Burning with urination 01/04/2016   Diabetes mellitus without complication (HCC)    Genital herpes    GERD (gastroesophageal reflux disease)    Hematuria 01/04/2016   Herpes 12/22/2015   Hyperlipidemia    Hypertension    LLQ abdominal tenderness 01/04/2016   Neuropathy    Pelvic pressure in female 01/04/2016   Recurrent UTI    Sciatic nerve disease    Small bowel obstruction (HCC) 04/27/2016    Urinary frequency 01/04/2016   Urticaria     Past Surgical History:  Procedure Laterality Date   ABDOMINAL HYSTERECTOMY     APPLICATION OF INTRAOPERATIVE CT SCAN N/A 12/19/2018   Procedure: APPLICATION OF INTRAOPERATIVE CT SCAN;  Surgeon: Maeola Harman, MD;  Location: [facility] OR;  Service: Neurosurgery;  Laterality: N/A;   bowel obstruction     COLON SURGERY     blocked colon   POSTERIOR CERVICAL FUSION/FORAMINOTOMY N/A 12/19/2018   Procedure: Cervical Seven to Thoracic One Posterior cervical fusion;  Surgeon: Maeola Harman, MD;  Location: [facility] OR;  Service: Neurosurgery;  Laterality: N/A;  Cervical Seven to Thoracic One Posterior cervical fusion    Social History   Socioeconomic History   Marital status: Widowed    Spouse name: Not on file   Number of children: Not on file   Years of education: Not on file   Highest education level: Not on file  Occupational History   Occupation: retired   Tobacco Use   Smoking status: Never   Smokeless tobacco: Current    Types: Snuff  Vaping Use   Vaping Use: Never used  Substance and Sexual Activity   Alcohol use: No   Drug use: No   Sexual activity: Not Currently    Birth control/protection: Surgical    Comment: hyst  Other Topics Concern   Not on file  Social History Narrative   Long term resident of  [facility]    Social Determinants of Health   Financial Resource Strain: Not on file  Food Insecurity: Not on file  Transportation Needs: Not on file  Physical Activity: Not on file  Stress: Not on file  Social Connections: Not on file  Intimate Partner Violence: Not on file   Family History  Problem Relation Age of Onset   Diabetes Mother    Heart disease Brother    Hypertension Brother    Diabetes Brother    Hypertension Daughter    Diabetes Brother    Diabetes Brother    Alzheimer's disease Brother    Anxiety disorder Daughter       VITAL SIGNS BP (!) 153/83   Pulse 78   Temp 98.1 F (36.7 C)   Ht 5' (1.524 m)   Wt 192 lb  6.4 oz (87.3 kg)   BMI 37.58 kg/m   Outpatient Encounter Medications as of 10/01/2021  Medication Sig   acetaminophen (TYLENOL) 650 MG CR tablet Take 650 mg by mouth every 8 (eight) hours.   amLODipine (NORVASC) 10 MG tablet Take 1 tablet (10 mg total) by mouth daily.   aspirin 81 MG chewable tablet Chew 81 mg by mouth daily.   Cranberry 500 MG CAPS Take by mouth in the morning and at bedtime. For UTI prevention   cyanocobalamin 1000 MCG tablet Take 1,000 mcg by mouth daily. Special Instructions: for vit B 12 deficiency   diclofenac Sodium (VOLTAREN) 1 % GEL Apply 2 g topically 2 (two) times daily as needed.   DULoxetine (CYMBALTA) 20 MG capsule Take 20 mg by mouth 2 (two) times daily.   lamoTRIgine (LAMICTAL) 25 MG tablet Take 25 mg by mouth 2 (two) times daily.    linagliptin (TRADJENTA) 5 MG TABS tablet Take 5 mg by mouth daily.   loratadine (CLARITIN) 10 MG tablet Take 10 mg by mouth daily.   losartan (COZAAR) 25 MG tablet Take 12.5 mg by mouth daily.   Melatonin 5 MG TABS Take 5 mg by mouth at bedtime.   metFORMIN (GLUCOPHAGE) 1000 MG tablet Take 1,000 mg by mouth 2 (two) times daily with a meal.    mirabegron ER (MYRBETRIQ) 50 MG TB24 tablet Take 50 mg by mouth daily.   montelukast (SINGULAIR) 10 MG tablet Take 1 tablet (10 mg total) by mouth at bedtime.   NON FORMULARY Diet:  Consistent Carbohydrate,   omeprazole (PRILOSEC) 20 MG capsule Take 20 mg by mouth daily. Special Instructions: TAKE 1 CAPSULE BY MOUTH EVERY MORNING *TAKE ON AN EMPTY STOMACH* *DO NOT CRUSH* (FORMULARY SUB FOR   polyethylene glycol (MIRALAX / GLYCOLAX) packet Take 17 g by mouth daily.   senna-docusate (SENOKOT-S) 8.6-50 MG tablet Take 1 tablet by mouth at bedtime.    simvastatin (ZOCOR) 10 MG tablet Take 10 mg by mouth at bedtime.    triamcinolone (NASACORT) 55 MCG/ACT AERO nasal inhaler Place 1 spray into the nose daily.   valACYclovir (VALTREX) 500 MG tablet Take 500 mg by mouth daily.    No  facility-administered encounter medications on file as of 10/01/2021.     SIGNIFICANT DIAGNOSTIC EXAMS   PREVIOUS  07-16-20 DEXA: t score 0.825   NO NEW EXAMS.   LABS REVIEWED PREVIOUS;    09-21-20: urine culture: e-coli: cipro  10-01-20: wbc 14.8; hgb 13.5; hct 43.3; mcv 87.7 plt 240; glucose 146; bun 19; creat 1.05; k+ 4.4; na++ 136; ca 9.7 vitamin B 12: 877; folate 13.9; tsh 4.543 11-16-20: wbc 11.7; hgb 12.7;  hct 40.9; mcv 887 plt 213 urine culture: klebsiella pneumoniae: cipro 12-19-20: urine culture: e-coli 12-30-20: hgb a1c 6.9  02-26-21: urine culture: 80,000 e-coli 03-18-21: hgb a1c 7.5 chol 169; ldl 70; trig 222; hdl 55 04-05-21:wbc 8.6; hgb 11.5; hct 37.2; mcv 87.7 plt 184; glucose 180; bun 18; creat 0.85; k+ 3.7; na++ 135; ca 8.9; GFR>60; liver normal albumin 3.6 04-08-21: glucose 148; bun 22; creat 0.89; k+ 3.7; na++ 137; ca 8.8; GFR >60; urine micro-albumin <0.3  07-23-21: urine culture: e-coli rocephin 08-06-21: glucose 139; bun 20; creat 0.97; k+ 3.8; na++ 137; ca 8.9; GFR 59; mag 1.9   NO NEW LABS.   Review of Systems  Constitutional:  Negative for malaise/fatigue.  Respiratory:  Negative for cough and shortness of breath.   Cardiovascular:  Negative for chest pain, palpitations and leg swelling.  Gastrointestinal:  Negative for abdominal pain, constipation and heartburn.  Musculoskeletal:  Negative for back pain, joint pain and myalgias.  Skin: Negative.   Neurological:  Negative for dizziness.  Psychiatric/Behavioral:  The patient is not nervous/anxious.    Physical Exam Constitutional:      General: She is not in acute distress.    Appearance: She is well-developed. She is not diaphoretic.  Neck:     Thyroid: No thyromegaly.  Cardiovascular:     Rate and Rhythm: Normal rate and regular rhythm.     Pulses: Normal pulses.     Heart sounds: Normal heart sounds.  Pulmonary:     Effort: Pulmonary effort is normal. No respiratory distress.     Breath sounds: Normal  breath sounds.  Abdominal:     General: Bowel sounds are normal. There is no distension.     Palpations: Abdomen is soft.     Tenderness: There is no abdominal tenderness.  Musculoskeletal:        General: Normal range of motion.     Cervical back: Neck supple.     Right lower leg: No edema.     Left lower leg: No edema.  Lymphadenopathy:     Cervical: No cervical adenopathy.  Skin:    General: Skin is warm and dry.  Neurological:     Mental Status: She is alert and oriented to person, place, and time.  Psychiatric:        Mood and Affect: Mood normal.      ASSESSMENT/ PLAN:  TODAY  Aortic atherosclerosis Type 2 diabetes mellitus with peripheral angiography CKD stage 3 due to type 2 diabetes mellitus Major depression chronic recurrent  Will continue current medications Will continue current plan of care Will continue to monitor her status.    Time spent with patient: 40 minutes: medications; plan of care; therapy    Synthia Innocent NP [facility]  Contact [phone] Monday through Friday 8am- 5pm  After hours call [phone]

## 2021-10-04 DIAGNOSIS — E1122 Type 2 diabetes mellitus with diabetic chronic kidney disease: Secondary | ICD-10-CM | POA: Insufficient documentation

## 2021-10-04 DIAGNOSIS — N183 Chronic kidney disease, stage 3 unspecified: Secondary | ICD-10-CM | POA: Insufficient documentation

## 2021-10-05 DIAGNOSIS — Z23 Encounter for immunization: Secondary | ICD-10-CM | POA: Diagnosis not present

## 2021-10-06 DIAGNOSIS — Z20822 Contact with and (suspected) exposure to covid-19: Secondary | ICD-10-CM | POA: Diagnosis not present

## 2021-10-06 DIAGNOSIS — F0390 Unspecified dementia without behavioral disturbance: Secondary | ICD-10-CM | POA: Diagnosis not present

## 2021-10-06 DIAGNOSIS — Z1159 Encounter for screening for other viral diseases: Secondary | ICD-10-CM | POA: Diagnosis not present

## 2021-10-22 ENCOUNTER — Non-Acute Institutional Stay (SKILLED_NURSING_FACILITY): Payer: Medicare Other | Admitting: Internal Medicine

## 2021-10-22 ENCOUNTER — Encounter: Payer: Self-pay | Admitting: Internal Medicine

## 2021-10-22 DIAGNOSIS — E1151 Type 2 diabetes mellitus with diabetic peripheral angiopathy without gangrene: Secondary | ICD-10-CM

## 2021-10-22 DIAGNOSIS — E785 Hyperlipidemia, unspecified: Secondary | ICD-10-CM

## 2021-10-22 DIAGNOSIS — E1122 Type 2 diabetes mellitus with diabetic chronic kidney disease: Secondary | ICD-10-CM | POA: Diagnosis not present

## 2021-10-22 DIAGNOSIS — E1159 Type 2 diabetes mellitus with other circulatory complications: Secondary | ICD-10-CM

## 2021-10-22 DIAGNOSIS — E1169 Type 2 diabetes mellitus with other specified complication: Secondary | ICD-10-CM

## 2021-10-22 DIAGNOSIS — N3281 Overactive bladder: Secondary | ICD-10-CM

## 2021-10-22 DIAGNOSIS — N183 Chronic kidney disease, stage 3 unspecified: Secondary | ICD-10-CM

## 2021-10-22 DIAGNOSIS — D649 Anemia, unspecified: Secondary | ICD-10-CM

## 2021-10-22 DIAGNOSIS — I152 Hypertension secondary to endocrine disorders: Secondary | ICD-10-CM

## 2021-10-22 NOTE — Assessment & Plan Note (Signed)
LDL at goal with a value of 50 on low-dose statin.  No change indicated.

## 2021-10-22 NOTE — Assessment & Plan Note (Addendum)
Today she describes both stool and urinary incontinence.  This is in the context of PMH of cord compression. Indication for further work-up will be discussed with [facility] NP.

## 2021-10-22 NOTE — Patient Instructions (Signed)
See assessment and plan under each diagnosis in the problem list and acutely for this visit 

## 2021-10-22 NOTE — Progress Notes (Signed)
NURSING HOME LOCATION:  [facility] ROOM NUMBER:  115 W  CODE STATUS:  Full Code  PCP:  Synthia Innocent NP  This is a nursing facility follow up visit of chronic medical diagnoses & to document compliance with Regulation 483.30 (c) in The Long Term Care Survey Manual Phase 2 which mandates caregiver visit ( visits can alternate among physician, PA or NP as per statutes) within 10 days of 30 days / 60 days/ 90 days post admission to SNF date    Interim medical record and care since last SNF visit was updated with review of diagnostic studies and change in clinical status since last visit were documented.  HPI: She is a permanent resident of the facility with medical diagnoses of GERD, dyslipidemia, essential hypertension, diabetes with peripheral neuropathy, and history of small bowel obstruction. Surgeries and procedures include colonic surgery for blockage, posterior cervical fusion/foraminotomy, abdominal hysterectomy.  Current labs reviewed.  Diabetic control is excellent with an A1c of 7.2%.  CKD is now stage II with a creatinine of 0.88 and GFR greater than 60.  It is noted that she is on ARB.  LDL is at goal with a value of 50 on low-dose statin.  Normochromic, normocytic anemia essentially stable with values 11.2/36.2.  B12 level supranormal.  Review of systems: Her main complaint is that "my womb feels like everything is coming out".  She describes urinary and stool incontinence.  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   She described pain in the left lower extremity which responds to topical Biofreeze.  She has chronic numbness in her feet. She states that she has had a cough for several months which is nonproductive and associated with sneezing.  She does use inhaled agent which is of benefit subjectively.  Constitutional: No fever, significant weight change, fatigue  Eyes: No redness, discharge, pain, vision change ENT/mouth: No nasal congestion,   purulent discharge, earache, change in hearing, sore throat  Cardiovascular: No chest pain, palpitations, paroxysmal nocturnal dyspnea, claudication, edema  Respiratory: No  sputum production, hemoptysis, DOE, significant snoring, apnea   Gastrointestinal: No heartburn, dysphagia, abdominal pain, nausea /vomiting, rectal bleeding, melena Genitourinary: No dysuria, hematuria, pyuria Dermatologic: No rash, pruritus, change in appearance of skin Neurologic: No dizziness, headache, syncope, seizures Psychiatric: No significant anxiety, depression, insomnia, anorexia Endocrine: No change in hair/skin/nails, excessive thirst, excessive hunger, excessive urination  Hematologic/lymphatic: No significant bruising, lymphadenopathy, abnormal bleeding Allergy/immunology: No itchy/watery eyes, urticaria, angioedema  Physical exam:  Pertinent or positive findings: She is extremely gregarious and speaks in a loud voice as she converses.  She affectionately called me "Sugar".  Maxilla is edentulous. Partial or plate not worn. Tongue is dry.  Slight tachycardia is present with an occasional pause.  Pedal pulses are palpable.  She is strong to opposition in all extremities.  When I commented on how strong she was she offered to "arm wrestle".  General appearance: Adequately nourished; no acute distress, increased work of breathing is present.   Lymphatic: No lymphadenopathy about the head, neck, axilla. Eyes: No conjunctival inflammation or lid edema is present. There is no scleral icterus. Ears:  External ear exam shows no significant lesions or deformities.   Nose:  External nasal examination shows no deformity or inflammation. Nasal mucosa are pink and moist without lesions, exudates Oral exam:  Lips and gums are healthy appearing. There is no oropharyngeal erythema or exudate. Neck:  No thyromegaly, masses, tenderness noted.    Heart:  No murmur, click, rub .  Lungs: without wheezes, rhonchi, rales,  rubs. Abdomen: Bowel sounds are normal. Abdomen is soft and nontender with no organomegaly, hernias, masses. GU: Deferred  Extremities:  No cyanosis, clubbing, edema  Neurologic exam :Balance, Rhomberg, finger to nose testing could not be completed due to clinical state Skin: Warm & dry w/o tenting. No significant lesions or rash.  See summary under each active problem in the Problem List with associated updated therapeutic plan

## 2021-10-22 NOTE — Assessment & Plan Note (Addendum)
Creat 0.88/GFR >60; CKD Stage 2 On ARB BP controlled; no change in antihypertensive medications if there is no progression of CKD

## 2021-10-22 NOTE — Assessment & Plan Note (Addendum)
A1c is 7.2% indicating excellent control.  No change unless hypoglycemia present.

## 2021-10-22 NOTE — Assessment & Plan Note (Signed)
On ARB; GFR greater than 60 indicating stage II.  No change indicated at this time.

## 2021-10-22 NOTE — Assessment & Plan Note (Signed)
Anemia essentially stable.  B12 level supranormal.

## 2021-11-08 ENCOUNTER — Encounter: Payer: Self-pay | Admitting: Adult Health

## 2021-11-08 NOTE — Progress Notes (Signed)
Location:  [facility] Room Number: 115-W Place of Service:  SNF (31)   CODE STATUS: Full Code  Allergies  Allergen Reactions   Lisinopril    Penicillins Nausea Only    Has patient had a PCN reaction causing immediate rash, facial/tongue/throat swelling, SOB or lightheadedness with hypotension: Yes Has patient had a PCN reaction causing severe rash involving mucus membranes or skin necrosis: No Has patient had a PCN reaction that required hospitalization No Has patient had a PCN reaction occurring within the last 10 years: No If all of the above answers are "NO", then may proceed with Cephalosporin use.    Sulfa Antibiotics Nausea And Vomiting    Chief Complaint  Patient presents with   Acute Visit    Change in status     HPI:    Past Medical History:  Diagnosis Date   Anxiety    Bowel obstruction ([place])    Burning with urination 01/04/2016   Diabetes mellitus without complication (HCC)    Genital herpes    GERD (gastroesophageal reflux disease)    Hematuria 01/04/2016   Herpes 12/22/2015   Hyperlipidemia    Hypertension    LLQ abdominal tenderness 01/04/2016   Neuropathy    Pelvic pressure in female 01/04/2016   Recurrent UTI    Sciatic nerve disease    Small bowel obstruction ([place]) 04/27/2016   Urinary frequency 01/04/2016   Urticaria     Past Surgical History:  Procedure Laterality Date   ABDOMINAL HYSTERECTOMY     APPLICATION OF INTRAOPERATIVE CT SCAN N/A 12/19/2018   Procedure: APPLICATION OF INTRAOPERATIVE CT SCAN;  Surgeon: Erline Levine, MD;  Location: [place];  Service: Neurosurgery;  Laterality: N/A;   bowel obstruction     COLON SURGERY     blocked colon   POSTERIOR CERVICAL FUSION/FORAMINOTOMY N/A 12/19/2018   Procedure: Cervical Seven to Thoracic One Posterior cervical fusion;  Surgeon: Erline Levine, MD;  Location: [place];  Service: Neurosurgery;  Laterality: N/A;  Cervical Seven to Thoracic One Posterior cervical fusion    Social  History   Socioeconomic History   Marital status: Widowed    Spouse name: Not on file   Number of children: Not on file   Years of education: Not on file   Highest education level: Not on file  Occupational History   Occupation: retired   Tobacco Use   Smoking status: Never   Smokeless tobacco: Current    Types: Snuff  Vaping Use   Vaping Use: Never used  Substance and Sexual Activity   Alcohol use: No   Drug use: No   Sexual activity: Not Currently    Birth control/protection: Surgical    Comment: hyst  Other Topics Concern   Not on file  Social History Narrative   Long term resident of [facility]    Social Determinants of Health   Financial Resource Strain: Not on file  Food Insecurity: Not on file  Transportation Needs: Not on file  Physical Activity: Not on file  Stress: Not on file  Social Connections: Not on file  Intimate Partner Violence: Not on file   Family History  Problem Relation Age of Onset   Diabetes Mother    Heart disease Brother    Hypertension Brother    Diabetes Brother    Hypertension Daughter    Diabetes Brother    Diabetes Brother    Alzheimer's disease Brother    Anxiety disorder Daughter  VITAL SIGNS BP (!) 141/60   Pulse 80   Temp 98 F (36.7 C)   Resp 16   Ht 5' (1.524 m)   Wt 188 lb 12.8 oz (85.6 kg)   SpO2 98%   BMI 36.87 kg/m   Outpatient Encounter Medications as of 11/08/2021  Medication Sig   acetaminophen (TYLENOL) 650 MG CR tablet Take 650 mg by mouth every 8 (eight) hours.   amLODipine (NORVASC) 10 MG tablet Take 1 tablet (10 mg total) by mouth daily.   aspirin 81 MG chewable tablet Chew 81 mg by mouth daily.   Cranberry 500 MG CAPS Take by mouth in the morning and at bedtime. For UTI prevention   cyanocobalamin 1000 MCG tablet Take 1,000 mcg by mouth daily. Special Instructions: for vit B 12 deficiency   diclofenac Sodium (VOLTAREN) 1 % GEL Apply 2 g topically 2 (two) times daily as needed.   DULoxetine  (CYMBALTA) 20 MG capsule Take 20 mg by mouth 2 (two) times daily.   lamoTRIgine (LAMICTAL) 25 MG tablet Take 25 mg by mouth 2 (two) times daily.    linagliptin (TRADJENTA) 5 MG TABS tablet Take 5 mg by mouth daily.   loratadine (CLARITIN) 10 MG tablet Take 10 mg by mouth daily.   losartan (COZAAR) 25 MG tablet Take 12.5 mg by mouth daily.   Melatonin 5 MG TABS Take 5 mg by mouth at bedtime.   metFORMIN (GLUCOPHAGE) 1000 MG tablet Take 1,000 mg by mouth 2 (two) times daily with a meal.    mirabegron ER (MYRBETRIQ) 50 MG TB24 tablet Take 50 mg by mouth daily.   montelukast (SINGULAIR) 10 MG tablet Take 1 tablet (10 mg total) by mouth at bedtime.   NON FORMULARY Diet:  Consistent Carbohydrate,   omeprazole (PRILOSEC) 20 MG capsule Take 20 mg by mouth daily. Special Instructions: TAKE 1 CAPSULE BY MOUTH EVERY MORNING *TAKE ON AN EMPTY STOMACH* *DO NOT CRUSH* (FORMULARY SUB FOR   polyethylene glycol (MIRALAX / GLYCOLAX) packet Take 17 g by mouth daily.   senna-docusate (SENOKOT-S) 8.6-50 MG tablet Take 1 tablet by mouth at bedtime.    simvastatin (ZOCOR) 10 MG tablet Take 10 mg by mouth at bedtime.    triamcinolone (NASACORT) 55 MCG/ACT AERO nasal inhaler Place 1 spray into the nose daily.   valACYclovir (VALTREX) 500 MG tablet Take 500 mg by mouth daily.    No facility-administered encounter medications on file as of 11/08/2021.     SIGNIFICANT DIAGNOSTIC EXAMS       ASSESSMENT/ PLAN:     Synthia Innocent NP [facility]  Contact [phone] Monday through Friday 8am- 5pm  After hours call [phone]

## 2021-11-09 NOTE — Progress Notes (Signed)
This encounter was created in error - please disregard.

## 2021-11-18 ENCOUNTER — Non-Acute Institutional Stay (SKILLED_NURSING_FACILITY): Payer: Medicare Other | Admitting: Adult Health

## 2021-11-18 ENCOUNTER — Encounter: Payer: Self-pay | Admitting: Adult Health

## 2021-11-18 DIAGNOSIS — F339 Major depressive disorder, recurrent, unspecified: Secondary | ICD-10-CM

## 2021-11-18 DIAGNOSIS — K5909 Other constipation: Secondary | ICD-10-CM | POA: Diagnosis not present

## 2021-11-18 DIAGNOSIS — E538 Deficiency of other specified B group vitamins: Secondary | ICD-10-CM

## 2021-11-18 DIAGNOSIS — B009 Herpesviral infection, unspecified: Secondary | ICD-10-CM

## 2021-11-18 NOTE — Progress Notes (Signed)
Location:  [facility] Room Number: 115 Place of Service:  SNF (31)   CODE STATUS: full   Allergies  Allergen Reactions   Lisinopril    Penicillins Nausea Only    Has patient had a PCN reaction causing immediate rash, facial/tongue/throat swelling, SOB or lightheadedness with hypotension: Yes Has patient had a PCN reaction causing severe rash involving mucus membranes or skin necrosis: No Has patient had a PCN reaction that required hospitalization No Has patient had a PCN reaction occurring within the last 10 years: No If all of the above answers are "NO", then may proceed with Cephalosporin use.    Sulfa Antibiotics Nausea And Vomiting    Chief Complaint  Patient presents with   Medical Management of Chronic Issues           Herpes:  Vitamin B 12 deficiency:   Chronic constipation:  Major depression recurrent chronic    HPI:  She is an 80 year old long term resident of this facility being seen for the management of her chronic illnesses: Herpes:  Vitamin B 12 deficiency:   Chronic constipation:  Major depression recurrent chronic. There are no reports of uncontrolled pain; no reports of constipation; no reports of depressive or anxious thoughts.   Past Medical History:  Diagnosis Date   Anxiety    Bowel obstruction (HCC)    Burning with urination 01/04/2016   Diabetes mellitus without complication (HCC)    Genital herpes    GERD (gastroesophageal reflux disease)    Hematuria 01/04/2016   Herpes 12/22/2015   Hyperlipidemia    Hypertension    LLQ abdominal tenderness 01/04/2016   Neuropathy    Pelvic pressure in female 01/04/2016   Recurrent UTI    Sciatic nerve disease    Small bowel obstruction (HCC) 04/27/2016   Urinary frequency 01/04/2016   Urticaria     Past Surgical History:  Procedure Laterality Date   ABDOMINAL HYSTERECTOMY     APPLICATION OF INTRAOPERATIVE CT SCAN N/A 12/19/2018   Procedure: APPLICATION OF INTRAOPERATIVE CT SCAN;   Surgeon: Maeola Harman, MD;  Location: [facility] OR;  Service: Neurosurgery;  Laterality: N/A;   bowel obstruction     COLON SURGERY     blocked colon   POSTERIOR CERVICAL FUSION/FORAMINOTOMY N/A 12/19/2018   Procedure: Cervical Seven to Thoracic One Posterior cervical fusion;  Surgeon: Maeola Harman, MD;  Location: [facility] OR;  Service: Neurosurgery;  Laterality: N/A;  Cervical Seven to Thoracic One Posterior cervical fusion    Social History   Socioeconomic History   Marital status: Widowed    Spouse name: Not on file   Number of children: Not on file   Years of education: Not on file   Highest education level: Not on file  Occupational History   Occupation: retired   Tobacco Use   Smoking status: Never   Smokeless tobacco: Current    Types: Snuff  Vaping Use   Vaping Use: Never used  Substance and Sexual Activity   Alcohol use: No   Drug use: No   Sexual activity: Not Currently    Birth control/protection: Surgical    Comment: hyst  Other Topics Concern   Not on file  Social History Narrative   Long term resident of [facility]    Social Determinants of Health   Financial Resource Strain: Not on file  Food Insecurity: Not on file  Transportation Needs: Not on file  Physical Activity: Not on file  Stress: Not on  file  Social Connections: Not on file  Intimate Partner Violence: Not on file   Family History  Problem Relation Age of Onset   Diabetes Mother    Heart disease Brother    Hypertension Brother    Diabetes Brother    Hypertension Daughter    Diabetes Brother    Diabetes Brother    Alzheimer's disease Brother    Anxiety disorder Daughter       VITAL SIGNS BP 140/86   Pulse 82   Temp 98.3 F (36.8 C)   Ht 5' (1.524 m)   Wt 186 lb 12.8 oz (84.7 kg)   BMI 36.48 kg/m   Outpatient Encounter Medications as of 11/18/2021  Medication Sig   acetaminophen (TYLENOL) 650 MG CR tablet Take 650 mg by mouth every 8 (eight) hours.   amLODipine (NORVASC) 10 MG tablet Take 1  tablet (10 mg total) by mouth daily.   aspirin 81 MG chewable tablet Chew 81 mg by mouth daily.   Cranberry 500 MG CAPS Take by mouth in the morning and at bedtime. For UTI prevention   cyanocobalamin 1000 MCG tablet Take 1,000 mcg by mouth daily. Special Instructions: for vit B 12 deficiency   diclofenac Sodium (VOLTAREN) 1 % GEL Apply 2 g topically 2 (two) times daily as needed.   DULoxetine (CYMBALTA) 20 MG capsule Take 20 mg by mouth 2 (two) times daily.   lamoTRIgine (LAMICTAL) 25 MG tablet Take 25 mg by mouth 2 (two) times daily.    linagliptin (TRADJENTA) 5 MG TABS tablet Take 5 mg by mouth daily.   loratadine (CLARITIN) 10 MG tablet Take 10 mg by mouth daily.   losartan (COZAAR) 25 MG tablet Take 12.5 mg by mouth daily.   Melatonin 5 MG TABS Take 5 mg by mouth at bedtime.   metFORMIN (GLUCOPHAGE) 1000 MG tablet Take 1,000 mg by mouth 2 (two) times daily with a meal.    mirabegron ER (MYRBETRIQ) 50 MG TB24 tablet Take 50 mg by mouth daily.   montelukast (SINGULAIR) 10 MG tablet Take 1 tablet (10 mg total) by mouth at bedtime.   NON FORMULARY Diet:  Consistent Carbohydrate,   omeprazole (PRILOSEC) 20 MG capsule Take 20 mg by mouth daily. Special Instructions: TAKE 1 CAPSULE BY MOUTH EVERY MORNING *TAKE ON AN EMPTY STOMACH* *DO NOT CRUSH* (FORMULARY SUB FOR   polyethylene glycol (MIRALAX / GLYCOLAX) packet Take 17 g by mouth daily.   senna-docusate (SENOKOT-S) 8.6-50 MG tablet Take 1 tablet by mouth at bedtime.    simvastatin (ZOCOR) 10 MG tablet Take 10 mg by mouth at bedtime.    triamcinolone (NASACORT) 55 MCG/ACT AERO nasal inhaler Place 1 spray into the nose daily.   valACYclovir (VALTREX) 500 MG tablet Take 500 mg by mouth daily.    No facility-administered encounter medications on file as of 11/18/2021.     SIGNIFICANT DIAGNOSTIC EXAMS   PREVIOUS  07-16-20 DEXA: t score 0.825   NO NEW EXAMS.   LABS REVIEWED PREVIOUS;    11-16-20: wbc 11.7; hgb 12.7; hct 40.9; mcv 887 plt  213 urine culture: klebsiella pneumoniae: cipro 12-19-20: urine culture: e-coli 12-30-20: hgb a1c 6.9  02-26-21: urine culture: 80,000 e-coli 03-18-21: hgb a1c 7.5 chol 169; ldl 70; trig 222; hdl 55 04-05-21:wbc 8.6; hgb 11.5; hct 37.2; mcv 87.7 plt 184; glucose 180; bun 18; creat 0.85; k+ 3.7; na++ 135; ca 8.9; GFR>60; liver normal albumin 3.6 04-08-21: glucose 148; bun 22; creat 0.89; k+ 3.7; na++ 137; ca  8.8; GFR >60; urine micro-albumin <0.3  07-23-21: urine culture: e-coli rocephin 08-06-21: glucose 139; bun 20; creat 0.97; k+ 3.8; na++ 137; ca 8.9; GFR 59; mag 1.9   TODAY  09-30-21: wbc 9.4; hgb 11.2; hct 36.2; mcv 85.4 plt 207; glucose 153; bun 18; creat 0.88; k+ 3.6; na++ 136; ca 8.7; GFR>60 liver normal albumin 3.6; hgb a1c 7.2; chol 124; ldl 50; trig 154; hdl 43; vit B 12: 1086   Review of Systems  Constitutional:  Negative for malaise/fatigue.  Respiratory:  Negative for cough and shortness of breath.   Cardiovascular:  Negative for chest pain, palpitations and leg swelling.  Gastrointestinal:  Negative for abdominal pain, constipation and heartburn.  Musculoskeletal:  Negative for back pain, joint pain and myalgias.  Skin: Negative.   Neurological:  Negative for dizziness.  Psychiatric/Behavioral:  The patient is not nervous/anxious.     Physical Exam Constitutional:      General: She is not in acute distress.    Appearance: She is well-developed. She is not diaphoretic.  Neck:     Thyroid: No thyromegaly.  Cardiovascular:     Rate and Rhythm: Normal rate and regular rhythm.     Pulses: Normal pulses.     Heart sounds: Normal heart sounds.  Pulmonary:     Effort: Pulmonary effort is normal. No respiratory distress.     Breath sounds: Normal breath sounds.  Abdominal:     General: Bowel sounds are normal. There is no distension.     Palpations: Abdomen is soft.     Tenderness: There is no abdominal tenderness.  Musculoskeletal:        General: Normal range of motion.      Cervical back: Neck supple.     Right lower leg: No edema.     Left lower leg: No edema.  Lymphadenopathy:     Cervical: No cervical adenopathy.  Skin:    General: Skin is warm and dry.  Neurological:     Mental Status: She is alert and oriented to person, place, and time.  Psychiatric:        Mood and Affect: Mood normal.     ASSESSMENT/ PLAN:  TODAY  Herpes: is stable no recent outbreaks; will continue valtrex 500 mg daily   2. Vitamin B 12 deficiency: stable level is 1086; will continue 1000 mcg daily   3. Chronic constipation: is stable will continue miralax daily and senna s daily   4. Major depression recurrent chronic: is stable will continue cymbalta 20 mg twice daily (failed one wean) lamictal 25 mg twice daily for mood support; melatonin 5 mg nightly for sleep    PREVIOUS   5. Hypertension associated with type 2 diabetes mellitus: is stable b/p 153/83 will continue norvasc 10 mg daily asa 81 mg daily   6. Dyslipidemia associated with type 2 diabetes mellitus: is stable LDL 70 will continue zocor 10 mg daily   7. Aortic atherosclerosis (ct 12-18-19) will monitor  8. Type 2 diabetes mellitus with peripheral angiopathy is stable hgb a1c 7.5 will continue metformin 1 gm twice daily tradjenta 5 mg daily on ace and statin   9. Gastroesophageal reflux disease without esophagitis: is stable will continue prilosec 20 mg daily   10. OAB: is stable will continue myrbetriq 50 mg daily  11. Chronic generalized pain: is stable will continue tylenol cr 650 mg every 8 hours voltaren gel 2 gm to both knees twice daily as needed   12. Chronic non-seasonal allergic rhinitis:  is stable will continue claritin 10 mg daily; singulair 10 mg daily and nasocort daily      Ok Edwards NP [facility]  Contact [phone] Monday through Friday 8am- 5pm  After hours call [phone]

## 2021-12-16 DIAGNOSIS — F0392 Unspecified dementia, unspecified severity, with psychotic disturbance: Secondary | ICD-10-CM | POA: Diagnosis not present

## 2021-12-16 DIAGNOSIS — Z1159 Encounter for screening for other viral diseases: Secondary | ICD-10-CM | POA: Diagnosis not present

## 2021-12-20 ENCOUNTER — Non-Acute Institutional Stay (SKILLED_NURSING_FACILITY): Payer: Medicare Other | Admitting: Adult Health

## 2021-12-20 ENCOUNTER — Encounter: Payer: Self-pay | Admitting: Adult Health

## 2021-12-20 DIAGNOSIS — I700 Atherosclerosis of aorta: Secondary | ICD-10-CM | POA: Diagnosis not present

## 2021-12-20 DIAGNOSIS — F339 Major depressive disorder, recurrent, unspecified: Secondary | ICD-10-CM

## 2021-12-20 DIAGNOSIS — K219 Gastro-esophageal reflux disease without esophagitis: Secondary | ICD-10-CM | POA: Diagnosis not present

## 2021-12-20 DIAGNOSIS — E538 Deficiency of other specified B group vitamins: Secondary | ICD-10-CM | POA: Diagnosis not present

## 2021-12-20 DIAGNOSIS — K5909 Other constipation: Secondary | ICD-10-CM | POA: Diagnosis not present

## 2021-12-20 DIAGNOSIS — I152 Hypertension secondary to endocrine disorders: Secondary | ICD-10-CM

## 2021-12-20 DIAGNOSIS — E1151 Type 2 diabetes mellitus with diabetic peripheral angiopathy without gangrene: Secondary | ICD-10-CM

## 2021-12-20 DIAGNOSIS — E1122 Type 2 diabetes mellitus with diabetic chronic kidney disease: Secondary | ICD-10-CM | POA: Diagnosis not present

## 2021-12-20 DIAGNOSIS — E1159 Type 2 diabetes mellitus with other circulatory complications: Secondary | ICD-10-CM

## 2021-12-20 DIAGNOSIS — B009 Herpesviral infection, unspecified: Secondary | ICD-10-CM | POA: Diagnosis not present

## 2021-12-20 DIAGNOSIS — N3281 Overactive bladder: Secondary | ICD-10-CM

## 2021-12-20 DIAGNOSIS — J3089 Other allergic rhinitis: Secondary | ICD-10-CM | POA: Diagnosis not present

## 2021-12-20 DIAGNOSIS — N183 Chronic kidney disease, stage 3 unspecified: Secondary | ICD-10-CM

## 2021-12-20 NOTE — Progress Notes (Signed)
Location:  [place] Room Number: 115-W Place of Service:  SNF (31)   CODE STATUS: Full Code  Allergies  Allergen Reactions   Lisinopril    Penicillins Nausea Only    Has patient had a PCN reaction causing immediate rash, facial/tongue/throat swelling, SOB or lightheadedness with hypotension: Yes Has patient had a PCN reaction causing severe rash involving mucus membranes or skin necrosis: No Has patient had a PCN reaction that required hospitalization No Has patient had a PCN reaction occurring within the last 10 years: No If all of the above answers are "NO", then may proceed with Cephalosporin use.    Sulfa Antibiotics Nausea And Vomiting    Chief Complaint  Patient presents with   Annual Exam     HPI:  She is a 81 year old long term resident of this facility being seen for her annual exam. She has not required any hospitalization or visits to the ED over this past year. Her weight has been stable. She continues to have a good appetite. No uncontrolled pain. No falls over the past year. She has had 3 utis this past year.  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:  Past Medical History:  Diagnosis Date   Anxiety    Bowel obstruction (HCC)    Burning with urination 01/04/2016   Diabetes mellitus without complication (HCC)    Genital herpes    GERD (gastroesophageal reflux disease)    Hematuria 01/04/2016   Herpes 12/22/2015   Hyperlipidemia    Hypertension    LLQ abdominal tenderness 01/04/2016   Neuropathy    Pelvic pressure in female 01/04/2016   Recurrent UTI    Sciatic nerve disease    Small bowel obstruction ([place]) 04/27/2016   Urinary frequency 01/04/2016   Urticaria     Past Surgical History:  Procedure Laterality Date   ABDOMINAL  HYSTERECTOMY     APPLICATION OF INTRAOPERATIVE CT SCAN N/A 12/19/2018   Procedure: APPLICATION OF INTRAOPERATIVE CT SCAN;  Surgeon: Erline Levine, MD;  Location: [facility];  Service: Neurosurgery;  Laterality: N/A;   bowel obstruction     COLON SURGERY     blocked colon   POSTERIOR CERVICAL FUSION/FORAMINOTOMY N/A 12/19/2018   Procedure: Cervical Seven to Thoracic One Posterior cervical fusion;  Surgeon: Erline Levine, MD;  Location: [place];  Service: Neurosurgery;  Laterality: N/A;  Cervical Seven to Thoracic One Posterior cervical fusion    Social History   Socioeconomic History   Marital status: Widowed    Spouse name: Not on file   Number of children: Not on file   Years of education: Not on file   Highest education level: Not on file  Occupational History   Occupation: retired   Tobacco Use   Smoking status: Never   Smokeless tobacco: Current    Types: Snuff  Vaping Use   Vaping Use: Never used  Substance and Sexual Activity   Alcohol use: No   Drug use: No   Sexual activity: Not Currently    Birth control/protection: Surgical    Comment: hyst  Other Topics Concern   Not on file  Social History Narrative   Long term resident of [facility]    Social Determinants of Health   Financial Resource Strain: Not on file  Food Insecurity: Not on file  Transportation  Needs: Not on file  Physical Activity: Not on file  Stress: Not on file  Social Connections: Not on file  Intimate Partner Violence: Not on file   Family History  Problem Relation Age of Onset   Diabetes Mother    Heart disease Brother    Hypertension Brother    Diabetes Brother    Hypertension Daughter    Diabetes Brother    Diabetes Brother    Alzheimer's disease Brother    Anxiety disorder Daughter       VITAL SIGNS BP 126/72    Pulse 78    Temp 97.6 F (36.4 C)    Resp 16    Ht 5' (1.524 m)    Wt 185 lb 12.8 oz (84.3 kg)    SpO2 98%    BMI 36.29 kg/m   Outpatient Encounter Medications as of 12/20/2021   Medication Sig   acetaminophen (TYLENOL) 650 MG CR tablet Take 650 mg by mouth every 8 (eight) hours.   amLODipine (NORVASC) 10 MG tablet Take 1 tablet (10 mg total) by mouth daily.   aspirin 81 MG chewable tablet Chew 81 mg by mouth daily.   Cranberry 500 MG CAPS Take by mouth in the morning and at bedtime. For UTI prevention   cyanocobalamin 1000 MCG tablet Take 1,000 mcg by mouth daily. Special Instructions: for vit B 12 deficiency   diclofenac Sodium (VOLTAREN) 1 % GEL Apply 2 g topically 2 (two) times daily as needed.   DULoxetine (CYMBALTA) 20 MG capsule Take 20 mg by mouth 2 (two) times daily.   lamoTRIgine (LAMICTAL) 25 MG tablet Take 25 mg by mouth 2 (two) times daily.    linagliptin (TRADJENTA) 5 MG TABS tablet Take 5 mg by mouth daily.   loratadine (CLARITIN) 10 MG tablet Take 10 mg by mouth daily.   losartan (COZAAR) 25 MG tablet Take 12.5 mg by mouth daily.   Melatonin 5 MG TABS Take 5 mg by mouth at bedtime.   metFORMIN (GLUCOPHAGE) 1000 MG tablet Take 1,000 mg by mouth 2 (two) times daily with a meal.    mirabegron ER (MYRBETRIQ) 50 MG TB24 tablet Take 50 mg by mouth daily.   montelukast (SINGULAIR) 10 MG tablet Take 1 tablet (10 mg total) by mouth at bedtime.   NON FORMULARY Diet:  Consistent Carbohydrate,   omeprazole (PRILOSEC) 20 MG capsule Take 20 mg by mouth daily. Special Instructions: TAKE 1 CAPSULE BY MOUTH EVERY MORNING *TAKE ON AN EMPTY STOMACH* *DO NOT CRUSH* (FORMULARY SUB FOR   polyethylene glycol (MIRALAX / GLYCOLAX) packet Take 17 g by mouth daily.   PRESCRIPTION MEDICATION MICROGUARD POWDER Special Instructions: REDNESS UNDER BREAST Once A Day - PRN   senna-docusate (SENOKOT-S) 8.6-50 MG tablet Take 1 tablet by mouth at bedtime.    simvastatin (ZOCOR) 10 MG tablet Take 10 mg by mouth at bedtime.    triamcinolone (NASACORT) 55 MCG/ACT AERO nasal inhaler Place 1 spray into the nose daily.   valACYclovir (VALTREX) 500 MG tablet Take 500 mg by mouth daily.     No facility-administered encounter medications on file as of 12/20/2021.     SIGNIFICANT DIAGNOSTIC EXAMS   PREVIOUS  07-16-20 DEXA: t score 0.825   NO NEW EXAMS.   LABS REVIEWED PREVIOUS;    12-19-20: urine culture: e-coli 12-30-20: hgb a1c 6.9  02-26-21: urine culture: 80,000 e-coli 03-18-21: hgb a1c 7.5 chol 169; ldl 70; trig 222; hdl 55 04-05-21:wbc 8.6; hgb 11.5; hct 37.2; mcv 87.7 plt 184;  glucose 180; bun 18; creat 0.85; k+ 3.7; na++ 135; ca 8.9; GFR>60; liver normal albumin 3.6 04-08-21: glucose 148; bun 22; creat 0.89; k+ 3.7; na++ 137; ca 8.8; GFR >60; urine micro-albumin <0.3  07-23-21: urine culture: e-coli rocephin 08-06-21: glucose 139; bun 20; creat 0.97; k+ 3.8; na++ 137; ca 8.9; GFR 59; mag 1.9  09-30-21: wbc 9.4; hgb 11.2; hct 36.2; mcv 85.4 plt 207; glucose 153; bun 18; creat 0.88; k+ 3.6; na++ 136; ca 8.7; GFR>60 liver normal albumin 3.6; hgb a1c 7.2; chol 124; ldl 50; trig 154; hdl 43; vit B 12: 1086  NO NEW LABS.   Review of Systems  Constitutional:  Negative for malaise/fatigue.  Respiratory:  Negative for cough and shortness of breath.   Cardiovascular:  Negative for chest pain, palpitations and leg swelling.  Gastrointestinal:  Negative for abdominal pain, constipation and heartburn.  Musculoskeletal:  Negative for back pain, joint pain and myalgias.  Skin: Negative.   Neurological:  Negative for dizziness.  Psychiatric/Behavioral:  The patient is not nervous/anxious.    Physical Exam Constitutional:      General: She is not in acute distress.    Appearance: She is well-developed. She is obese. She is not diaphoretic.  HENT:     Nose: Nose normal.     Mouth/Throat:     Mouth: Mucous membranes are moist.     Pharynx: Oropharynx is clear.  Eyes:     Conjunctiva/sclera: Conjunctivae normal.  Neck:     Thyroid: No thyromegaly.  Cardiovascular:     Rate and Rhythm: Normal rate and regular rhythm.     Heart sounds: Normal heart sounds.  Pulmonary:      Effort: Pulmonary effort is normal. No respiratory distress.     Breath sounds: Normal breath sounds.  Abdominal:     General: Bowel sounds are normal. There is no distension.     Palpations: Abdomen is soft.     Tenderness: There is no abdominal tenderness.  Musculoskeletal:        General: Normal range of motion.     Cervical back: Neck supple.     Right lower leg: No edema.     Left lower leg: No edema.  Lymphadenopathy:     Cervical: No cervical adenopathy.  Skin:    General: Skin is warm and dry.  Neurological:     Mental Status: She is alert and oriented to person, place, and time.  Psychiatric:        Mood and Affect: Mood normal.       ASSESSMENT/ PLAN:  TODAY  Hypertension associated with type 2 diabetes mellitus: is stable b/p 126/72 will continue norvasc 10 mg daily asa 81 mg daily   2. Dyslipidemia associated with type 2 diabetes mellitus is stable LDL 50 will continue norvasc 10 mg daily   3. Aortic atherosclerosis (ct 12-18-19) will monitor  4. 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 Gastro esophageal reflux disease without esophagitis: is stable will continue prilosec 20 mg daily   6. OAB; is stable will continue myrbetriq 50 mg daily   7. Chronic generalized pain: is stable will continue tylenol cr 650 mg three times daily voltaren gel 2 gm to both knees twice daily as needed   8. Chronic non-seasonal allergic rhinitis: is stable will continue claritin 10 mg daily; singulair 10 mg daily and  nasocort daily   9. Herpes: no recent outbreaks; will continue valtrex 500 mg daily   10. Vit B 12 deficiency stable level is 1086 will continue 1000 mcg daily   11. Chronic constipation is stable will continue miralax daily and senna s daily   12. Major depression recurrent chronic: is stable will continue cymbalta 20 mg twice daily (failed one  wean) lamictal 25 mg twice daily for mood support melatonin 5 mg nightly for sleep   13. CKD stage 3 due to type 2 diabetes mellitus: is stable bun 18; creat 0.88; GFR >60  14. Normocytic anemia: is stable hgb 11.2 hct 36.2       Ok Edwards NP [facility]   call [phone]

## 2021-12-21 DIAGNOSIS — F0390 Unspecified dementia without behavioral disturbance: Secondary | ICD-10-CM | POA: Diagnosis not present

## 2021-12-21 DIAGNOSIS — R1312 Dysphagia, oropharyngeal phase: Secondary | ICD-10-CM | POA: Diagnosis not present

## 2021-12-21 DIAGNOSIS — K219 Gastro-esophageal reflux disease without esophagitis: Secondary | ICD-10-CM | POA: Diagnosis not present

## 2021-12-23 DIAGNOSIS — F0392 Unspecified dementia, unspecified severity, with psychotic disturbance: Secondary | ICD-10-CM | POA: Diagnosis not present

## 2021-12-23 DIAGNOSIS — Z1159 Encounter for screening for other viral diseases: Secondary | ICD-10-CM | POA: Diagnosis not present

## 2021-12-24 ENCOUNTER — Encounter: Payer: Self-pay | Admitting: Adult Health

## 2021-12-24 ENCOUNTER — Non-Acute Institutional Stay (SKILLED_NURSING_FACILITY): Payer: Medicare Other | Admitting: Adult Health

## 2021-12-24 DIAGNOSIS — F339 Major depressive disorder, recurrent, unspecified: Secondary | ICD-10-CM | POA: Diagnosis not present

## 2021-12-24 DIAGNOSIS — E1122 Type 2 diabetes mellitus with diabetic chronic kidney disease: Secondary | ICD-10-CM

## 2021-12-24 DIAGNOSIS — I700 Atherosclerosis of aorta: Secondary | ICD-10-CM | POA: Diagnosis not present

## 2021-12-24 DIAGNOSIS — N183 Chronic kidney disease, stage 3 unspecified: Secondary | ICD-10-CM

## 2021-12-24 NOTE — Progress Notes (Signed)
Location:  [place] Room Number: 115-W Place of Service:  SNF (31)   CODE STATUS: Full Code  Allergies  Allergen Reactions   Lisinopril    Penicillins Nausea Only    Has patient had a PCN reaction causing immediate rash, facial/tongue/throat swelling, SOB or lightheadedness with hypotension: Yes Has patient had a PCN reaction causing severe rash involving mucus membranes or skin necrosis: No Has patient had a PCN reaction that required hospitalization No Has patient had a PCN reaction occurring within the last 10 years: No If all of the above answers are "NO", then may proceed with Cephalosporin use.    Sulfa Antibiotics Nausea And Vomiting    Chief Complaint  Patient presents with   Acute Visit    Care plan meeting    HPI:    Past Medical History:  Diagnosis Date   Anxiety    Bowel obstruction ([place])    Burning with urination 01/04/2016   Diabetes mellitus without complication (HCC)    Genital herpes    GERD (gastroesophageal reflux disease)    Hematuria 01/04/2016   Herpes 12/22/2015   Hyperlipidemia    Hypertension    LLQ abdominal tenderness 01/04/2016   Neuropathy    Pelvic pressure in female 01/04/2016   Recurrent UTI    Sciatic nerve disease    Small bowel obstruction ([place]) 04/27/2016   Urinary frequency 01/04/2016   Urticaria     Past Surgical History:  Procedure Laterality Date   ABDOMINAL HYSTERECTOMY     APPLICATION OF INTRAOPERATIVE CT SCAN N/A 12/19/2018   Procedure: APPLICATION OF INTRAOPERATIVE CT SCAN;  Surgeon: Erline Levine, MD;  Location: [place];  Service: Neurosurgery;  Laterality: N/A;   bowel obstruction     COLON SURGERY     blocked colon   POSTERIOR CERVICAL FUSION/FORAMINOTOMY N/A 12/19/2018   Procedure: Cervical Seven to Thoracic One Posterior cervical fusion;  Surgeon: Erline Levine, MD;  Location: [place];  Service: Neurosurgery;  Laterality: N/A;  Cervical Seven to Thoracic One Posterior cervical fusion    Social  History   Socioeconomic History   Marital status: Widowed    Spouse name: Not on file   Number of children: Not on file   Years of education: Not on file   Highest education level: Not on file  Occupational History   Occupation: retired   Tobacco Use   Smoking status: Never   Smokeless tobacco: Current    Types: Snuff  Vaping Use   Vaping Use: Never used  Substance and Sexual Activity   Alcohol use: No   Drug use: No   Sexual activity: Not Currently    Birth control/protection: Surgical    Comment: hyst  Other Topics Concern   Not on file  Social History Narrative   Long term resident of [facility]    Social Determinants of Health   Financial Resource Strain: Not on file  Food Insecurity: Not on file  Transportation Needs: Not on file  Physical Activity: Not on file  Stress: Not on file  Social Connections: Not on file  Intimate Partner Violence: Not on file   Family History  Problem Relation Age of Onset   Diabetes Mother    Heart disease Brother    Hypertension Brother    Diabetes Brother    Hypertension Daughter    Diabetes Brother    Diabetes Brother    Alzheimer's disease Brother    Anxiety disorder Daughter       VITAL  SIGNS BP 126/72    Pulse 78    Temp 98 F (36.7 C)    Resp 16    Ht 5' (1.524 m)    Wt 185 lb 12.8 oz (84.3 kg)    SpO2 98%    BMI 36.29 kg/m   Outpatient Encounter Medications as of 12/24/2021  Medication Sig   acetaminophen (TYLENOL) 650 MG CR tablet Take 650 mg by mouth every 8 (eight) hours.   amLODipine (NORVASC) 10 MG tablet Take 1 tablet (10 mg total) by mouth daily.   aspirin 81 MG chewable tablet Chew 81 mg by mouth daily.   Cranberry 500 MG CAPS Take by mouth in the morning and at bedtime. For UTI prevention   cyanocobalamin 1000 MCG tablet Take 1,000 mcg by mouth daily. Special Instructions: for vit B 12 deficiency   diclofenac Sodium (VOLTAREN) 1 % GEL Apply 2 g topically 2 (two) times daily as needed.   DULoxetine (CYMBALTA)  20 MG capsule Take 20 mg by mouth 2 (two) times daily.   lamoTRIgine (LAMICTAL) 25 MG tablet Take 25 mg by mouth 2 (two) times daily.    linagliptin (TRADJENTA) 5 MG TABS tablet Take 5 mg by mouth daily.   loratadine (CLARITIN) 10 MG tablet Take 10 mg by mouth daily.   losartan (COZAAR) 25 MG tablet Take 12.5 mg by mouth daily.   Melatonin 5 MG TABS Take 5 mg by mouth at bedtime.   metFORMIN (GLUCOPHAGE) 1000 MG tablet Take 1,000 mg by mouth 2 (two) times daily with a meal.    mirabegron ER (MYRBETRIQ) 50 MG TB24 tablet Take 50 mg by mouth daily.   montelukast (SINGULAIR) 10 MG tablet Take 1 tablet (10 mg total) by mouth at bedtime.   NON FORMULARY Diet:  Consistent Carbohydrate,   omeprazole (PRILOSEC) 20 MG capsule Take 20 mg by mouth daily. Special Instructions: TAKE 1 CAPSULE BY MOUTH EVERY MORNING *TAKE ON AN EMPTY STOMACH* *DO NOT CRUSH* (FORMULARY SUB FOR   polyethylene glycol (MIRALAX / GLYCOLAX) packet Take 17 g by mouth daily.   PRESCRIPTION MEDICATION MICROGUARD POWDER Special Instructions: REDNESS UNDER BREAST Once A Day - PRN   PRESCRIPTION MEDICATION MICROGUARD POWDER  powder; amt: SPARINGLY; topical Special Instructions: REDNESS UNDER BREAST Once A Day   PRESCRIPTION MEDICATION annusol cream  cream; amt: sparingly; rectal Special Instructions: hemmorhoids Once A Day - PRN   senna-docusate (SENOKOT-S) 8.6-50 MG tablet Take 1 tablet by mouth at bedtime.    simvastatin (ZOCOR) 10 MG tablet Take 10 mg by mouth at bedtime.    triamcinolone (NASACORT) 55 MCG/ACT AERO nasal inhaler Place 1 spray into the nose daily.   valACYclovir (VALTREX) 500 MG tablet Take 500 mg by mouth daily.    No facility-administered encounter medications on file as of 12/24/2021.     SIGNIFICANT DIAGNOSTIC EXAMS       ASSESSMENT/ PLAN:     Ok Edwards NP [facility]  Contact [phone] Monday through Friday 8am- 5pm  After hours call [phone]

## 2021-12-24 NOTE — Progress Notes (Signed)
Location:  [place] Room Number: 115-W Place of Service:  SNF (31)   CODE STATUS: full code   Allergies  Allergen Reactions   Lisinopril    Penicillins Nausea Only    Has patient had a PCN reaction causing immediate rash, facial/tongue/throat swelling, SOB or lightheadedness with hypotension: Yes Has patient had a PCN reaction causing severe rash involving mucus membranes or skin necrosis: No Has patient had a PCN reaction that required hospitalization No Has patient had a PCN reaction occurring within the last 10 years: No If all of the above answers are "NO", then may proceed with Cephalosporin use.    Sulfa Antibiotics Nausea And Vomiting    Chief Complaint  Patient presents with   Acute Visit    Care plan meeting    HPI:  We have come together for her care plan meeting. BIMS 13/15 mood 6/30: nervous/tired at times. She is independent to supervision with her adls. She is occasionally incontinent of bladder and continent of bowel. There have been no falls. Dietary: weight is 185.8 down 6 pounds in 3 months.  good appetite. Therapy: speech therapy for choking. Cbg stable will lower to 2 times monthly. 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  Past Medical History:  Diagnosis Date   Anxiety    Bowel obstruction (HCC)    Burning with urination 01/04/2016   Diabetes mellitus without complication (HCC)    Genital herpes    GERD (gastroesophageal reflux disease)    Hematuria 01/04/2016   Herpes 12/22/2015   Hyperlipidemia    Hypertension    LLQ abdominal tenderness 01/04/2016   Neuropathy    Pelvic pressure in female 01/04/2016   Recurrent UTI    Sciatic nerve disease    Small bowel obstruction ([place]) 04/27/2016   Urinary frequency 01/04/2016   Urticaria     Past Surgical History:  Procedure Laterality Date   ABDOMINAL  HYSTERECTOMY     APPLICATION OF INTRAOPERATIVE CT SCAN N/A 12/19/2018   Procedure: APPLICATION OF INTRAOPERATIVE CT SCAN;  Surgeon: Erline Levine, MD;  Location: [place];  Service: Neurosurgery;  Laterality: N/A;   bowel obstruction     COLON SURGERY     blocked colon   POSTERIOR CERVICAL FUSION/FORAMINOTOMY N/A 12/19/2018   Procedure: Cervical Seven to Thoracic One Posterior cervical fusion;  Surgeon: Erline Levine, MD;  Location: [place];  Service: Neurosurgery;  Laterality: N/A;  Cervical Seven to Thoracic One Posterior cervical fusion    Social History   Socioeconomic History   Marital status: Widowed    Spouse name: Not on file   Number of children: Not on file   Years of education: Not on file   Highest education level: Not on file  Occupational History   Occupation: retired   Tobacco Use   Smoking status: Never   Smokeless tobacco: Current    Types: Snuff  Vaping Use   Vaping Use: Never used  Substance and Sexual Activity   Alcohol use: No   Drug use: No   Sexual activity: Not Currently    Birth control/protection: Surgical    Comment: hyst  Other Topics Concern   Not on file  Social History Narrative   [facility] resident [facility]    Social Determinants of Health   Financial Resource Strain: Not on file  Food Insecurity: Not on file  Transportation Needs: Not on file  Physical Activity: Not on file  Stress: Not on file  Social Connections: Not on file  Intimate Partner Violence: Not on file   Family History  Problem Relation Age of Onset   Diabetes Mother    Heart disease Brother    Hypertension Brother    Diabetes Brother    Hypertension Daughter    Diabetes Brother    Diabetes Brother    Alzheimer's disease Brother    Anxiety disorder Daughter       VITAL SIGNS BP 126/72    Pulse 78    Temp 98 F (36.7 C)    Resp 16    Ht 5' (1.524 m)    Wt 185 lb 12.8 oz (84.3 kg)    SpO2 98%    BMI 36.29 kg/m   Outpatient Encounter Medications as of 12/24/2021   Medication Sig   acetaminophen (TYLENOL) 650 MG CR tablet Take 650 mg by mouth every 8 (eight) hours.   amLODipine (NORVASC) 10 MG tablet Take 1 tablet (10 mg total) by mouth daily.   aspirin 81 MG chewable tablet Chew 81 mg by mouth daily.   Cranberry 500 MG CAPS Take by mouth in the morning and at bedtime. For UTI prevention   cyanocobalamin 1000 MCG tablet Take 1,000 mcg by mouth daily. Special Instructions: for vit B 12 deficiency   diclofenac Sodium (VOLTAREN) 1 % GEL Apply 2 g topically 2 (two) times daily as needed.   DULoxetine (CYMBALTA) 20 MG capsule Take 20 mg by mouth 2 (two) times daily.   lamoTRIgine (LAMICTAL) 25 MG tablet Take 25 mg by mouth 2 (two) times daily.    linagliptin (TRADJENTA) 5 MG TABS tablet Take 5 mg by mouth daily.   loratadine (CLARITIN) 10 MG tablet Take 10 mg by mouth daily.   losartan (COZAAR) 25 MG tablet Take 12.5 mg by mouth daily.   Melatonin 5 MG TABS Take 5 mg by mouth at bedtime.   metFORMIN (GLUCOPHAGE) 1000 MG tablet Take 1,000 mg by mouth 2 (two) times daily with a meal.    mirabegron ER (MYRBETRIQ) 50 MG TB24 tablet Take 50 mg by mouth daily.   montelukast (SINGULAIR) 10 MG tablet Take 1 tablet (10 mg total) by mouth at bedtime.   NON FORMULARY Diet:  Consistent Carbohydrate,   omeprazole (PRILOSEC) 20 MG capsule Take 20 mg by mouth daily. Special Instructions: TAKE 1 CAPSULE BY MOUTH EVERY MORNING *TAKE ON AN EMPTY STOMACH* *DO NOT CRUSH* (FORMULARY SUB FOR   polyethylene glycol (MIRALAX / GLYCOLAX) packet Take 17 g by mouth daily.   PRESCRIPTION MEDICATION MICROGUARD POWDER Special Instructions: REDNESS UNDER BREAST Once A Day - PRN   PRESCRIPTION MEDICATION MICROGUARD POWDER  powder; amt: SPARINGLY; topical Special Instructions: REDNESS UNDER BREAST Once A Day   PRESCRIPTION MEDICATION annusol cream  cream; amt: sparingly; rectal Special Instructions: hemmorhoids Once A Day - PRN   senna-docusate (SENOKOT-S) 8.6-50 MG tablet Take 1  tablet by mouth at bedtime.    simvastatin (ZOCOR) 10 MG tablet Take 10 mg by mouth at bedtime.    triamcinolone (NASACORT) 55 MCG/ACT AERO nasal inhaler Place 1 spray into the nose daily.   valACYclovir (VALTREX) 500 MG tablet Take 500 mg by mouth daily.    No facility-administered encounter medications on file as of 12/24/2021.     SIGNIFICANT DIAGNOSTIC EXAMS  PREVIOUS  07-16-20 DEXA: t score 0.825   NO NEW EXAMS.   LABS REVIEWED PREVIOUS;  12-19-20: urine culture: e-coli 12-30-20: hgb a1c 6.9  02-26-21: urine culture: 80,000 e-coli 03-18-21: hgb a1c 7.5 chol 169; ldl 70; trig 222; hdl 55 04-05-21:wbc 8.6; hgb 11.5; hct 37.2; mcv 87.7 plt 184; glucose 180; bun 18; creat 0.85; k+ 3.7; na++ 135; ca 8.9; GFR>60; liver normal albumin 3.6 04-08-21: glucose 148; bun 22; creat 0.89; k+ 3.7; na++ 137; ca 8.8; GFR >60; urine micro-albumin <0.3  07-23-21: urine culture: e-coli rocephin 08-06-21: glucose 139; bun 20; creat 0.97; k+ 3.8; na++ 137; ca 8.9; GFR 59; mag 1.9  09-30-21: wbc 9.4; hgb 11.2; hct 36.2; mcv 85.4 plt 207; glucose 153; bun 18; creat 0.88; k+ 3.6; na++ 136; ca 8.7; GFR>60 liver normal albumin 3.6; hgb a1c 7.2; chol 124; ldl 50; trig 154; hdl 43; vit B 12: 1086  NO NEW LABS.   Review of Systems  Constitutional:  Negative for malaise/fatigue.  Respiratory:  Negative for cough and shortness of breath.   Cardiovascular:  Negative for chest pain, palpitations and leg swelling.  Gastrointestinal:  Negative for abdominal pain, constipation and heartburn.  Musculoskeletal:  Negative for back pain, joint pain and myalgias.  Skin: Negative.   Neurological:  Negative for dizziness.  Psychiatric/Behavioral:  The patient is not nervous/anxious.    Physical Exam Constitutional:      General: She is not in acute distress.    Appearance: She is well-developed. She is obese. She is not diaphoretic.  Neck:     Thyroid: No thyromegaly.  Cardiovascular:     Rate and Rhythm: Normal rate  and regular rhythm.     Pulses: Normal pulses.     Heart sounds: Normal heart sounds.  Pulmonary:     Effort: Pulmonary effort is normal. No respiratory distress.     Breath sounds: Normal breath sounds.  Abdominal:     General: Bowel sounds are normal. There is no distension.     Palpations: Abdomen is soft.     Tenderness: There is no abdominal tenderness.  Musculoskeletal:        General: Normal range of motion.     Cervical back: Neck supple.     Right lower leg: No edema.     Left lower leg: No edema.  Lymphadenopathy:     Cervical: No cervical adenopathy.  Skin:    General: Skin is warm and dry.  Neurological:     Mental Status: She is alert and oriented to person, place, and time.  Psychiatric:        Mood and Affect: Mood normal.     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    Time spent with patient: 40 minutes: medications; plan of care.   Ok Edwards NP [facility]   call [phone]

## 2022-01-06 DIAGNOSIS — F0392 Unspecified dementia, unspecified severity, with psychotic disturbance: Secondary | ICD-10-CM | POA: Diagnosis not present

## 2022-01-06 DIAGNOSIS — Z1159 Encounter for screening for other viral diseases: Secondary | ICD-10-CM | POA: Diagnosis not present

## 2022-01-18 ENCOUNTER — Encounter: Payer: Self-pay | Admitting: Adult Health

## 2022-01-18 ENCOUNTER — Non-Acute Institutional Stay (SKILLED_NURSING_FACILITY): Payer: Medicare Other | Admitting: Adult Health

## 2022-01-18 DIAGNOSIS — F410 Panic disorder [episodic paroxysmal anxiety] without agoraphobia: Secondary | ICD-10-CM

## 2022-01-18 NOTE — Progress Notes (Signed)
Location:  [place] Room Number: 115 Place of Service:  SNF (31)   CODE STATUS: full code   Allergies  Allergen Reactions   Lisinopril    Penicillins Nausea Only    Has patient had a PCN reaction causing immediate rash, facial/tongue/throat swelling, SOB or lightheadedness with hypotension: Yes Has patient had a PCN reaction causing severe rash involving mucus membranes or skin necrosis: No Has patient had a PCN reaction that required hospitalization No Has patient had a PCN reaction occurring within the last 10 years: No If all of the above answers are "NO", then may proceed with Cephalosporin use.    Sulfa Antibiotics Nausea And Vomiting    Chief Complaint  Patient presents with   Acute Visit    Panic attack     HPI:  She has had a panic attack yesterday. She was unable to be comforted. Felt short of breath. She did not sleep well last night. She does take cymbalta and lamictal for mood support. This is the second panic attack over the past couple of months.    Past Medical History:  Diagnosis Date   Anxiety    Bowel obstruction ([place])    Burning with urination 01/04/2016   Diabetes mellitus without complication (HCC)    Genital herpes    GERD (gastroesophageal reflux disease)    Hematuria 01/04/2016   Herpes 12/22/2015   Hyperlipidemia    Hypertension    LLQ abdominal tenderness 01/04/2016   Neuropathy    Pelvic pressure in female 01/04/2016   Recurrent UTI    Sciatic nerve disease    Small bowel obstruction ([place]) 04/27/2016   Urinary frequency 01/04/2016   Urticaria     Past Surgical History:  Procedure Laterality Date   ABDOMINAL HYSTERECTOMY     APPLICATION OF INTRAOPERATIVE CT SCAN N/A 12/19/2018   Procedure: APPLICATION OF INTRAOPERATIVE CT SCAN;  Surgeon: Erline Levine, MD;  Location: [place];  Service: Neurosurgery;  Laterality: N/A;   bowel obstruction     COLON SURGERY     blocked colon   POSTERIOR CERVICAL FUSION/FORAMINOTOMY  N/A 12/19/2018   Procedure: Cervical Seven to Thoracic One Posterior cervical fusion;  Surgeon: Erline Levine, MD;  Location: [place];  Service: Neurosurgery;  Laterality: N/A;  Cervical Seven to Thoracic One Posterior cervical fusion    Social History   Socioeconomic History   Marital status: Widowed    Spouse name: Not on file   Number of children: Not on file   Years of education: Not on file   Highest education level: Not on file  Occupational History   Occupation: retired   Tobacco Use   Smoking status: Never   Smokeless tobacco: Current    Types: Snuff  Vaping Use   Vaping Use: Never used  Substance and Sexual Activity   Alcohol use: No   Drug use: No   Sexual activity: Not Currently    Birth control/protection: Surgical    Comment: hyst  Other Topics Concern   Not on file  Social History Narrative   Long term resident of [facility]    Social Determinants of Health   Financial Resource Strain: Not on file  Food Insecurity: Not on file  Transportation Needs: Not on file  Physical Activity: Not on file  Stress: Not on file  Social Connections: Not on file  Intimate Partner Violence: Not on file   Family History  Problem Relation Age of Onset   Diabetes Mother  Heart disease Brother    Hypertension Brother    Diabetes Brother    Hypertension Daughter    Diabetes Brother    Diabetes Brother    Alzheimer's disease Brother    Anxiety disorder Daughter       VITAL SIGNS BP 100/77    Pulse 78    Temp 97.9 F (36.6 C)    Resp 18    Ht 5' (1.524 m)    Wt 185 lb 12.8 oz (84.3 kg)    SpO2 98%    BMI 36.29 kg/m   Outpatient Encounter Medications as of 01/18/2022  Medication Sig   acetaminophen (TYLENOL) 650 MG CR tablet Take 650 mg by mouth every 8 (eight) hours.   amLODipine (NORVASC) 10 MG tablet Take 1 tablet (10 mg total) by mouth daily.   aspirin 81 MG chewable tablet Chew 81 mg by mouth daily.   Cranberry 500 MG CAPS Take by mouth in the morning and at bedtime.  For UTI prevention   cyanocobalamin 1000 MCG tablet Take 1,000 mcg by mouth daily. Special Instructions: for vit B 12 deficiency   diclofenac Sodium (VOLTAREN) 1 % GEL Apply 2 g topically 2 (two) times daily as needed.   DULoxetine (CYMBALTA) 20 MG capsule Take 20 mg by mouth 2 (two) times daily.   lamoTRIgine (LAMICTAL) 25 MG tablet Take 25 mg by mouth 2 (two) times daily.    linagliptin (TRADJENTA) 5 MG TABS tablet Take 5 mg by mouth daily.   loratadine (CLARITIN) 10 MG tablet Take 10 mg by mouth daily.   losartan (COZAAR) 25 MG tablet Take 12.5 mg by mouth daily.   Melatonin 5 MG TABS Take 5 mg by mouth at bedtime.   metFORMIN (GLUCOPHAGE) 1000 MG tablet Take 1,000 mg by mouth 2 (two) times daily with a meal.    mirabegron ER (MYRBETRIQ) 50 MG TB24 tablet Take 50 mg by mouth daily.   montelukast (SINGULAIR) 10 MG tablet Take 1 tablet (10 mg total) by mouth at bedtime.   NON FORMULARY Diet:  Consistent Carbohydrate,   omeprazole (PRILOSEC) 20 MG capsule Take 20 mg by mouth daily. Special Instructions: TAKE 1 CAPSULE BY MOUTH EVERY MORNING *TAKE ON AN EMPTY STOMACH* *DO NOT CRUSH* (FORMULARY SUB FOR   polyethylene glycol (MIRALAX / GLYCOLAX) packet Take 17 g by mouth daily.   PRESCRIPTION MEDICATION MICROGUARD POWDER Special Instructions: REDNESS UNDER BREAST Once A Day - PRN   PRESCRIPTION MEDICATION MICROGUARD POWDER  powder; amt: SPARINGLY; topical Special Instructions: REDNESS UNDER BREAST Once A Day   PRESCRIPTION MEDICATION annusol cream  cream; amt: sparingly; rectal Special Instructions: hemmorhoids Once A Day - PRN   senna-docusate (SENOKOT-S) 8.6-50 MG tablet Take 1 tablet by mouth at bedtime.    simvastatin (ZOCOR) 10 MG tablet Take 10 mg by mouth at bedtime.    triamcinolone (NASACORT) 55 MCG/ACT AERO nasal inhaler Place 1 spray into the nose daily.   valACYclovir (VALTREX) 500 MG tablet Take 500 mg by mouth daily.    No facility-administered encounter medications on file  as of 01/18/2022.     SIGNIFICANT DIAGNOSTIC EXAMS   PREVIOUS  07-16-20 DEXA: t score 0.825   NO NEW EXAMS.   LABS REVIEWED PREVIOUS;    02-26-21: urine culture: 80,000 e-coli 03-18-21: hgb a1c 7.5 chol 169; ldl 70; trig 222; hdl 55 04-05-21:wbc 8.6; hgb 11.5; hct 37.2; mcv 87.7 plt 184; glucose 180; bun 18; creat 0.85; k+ 3.7; na++ 135; ca 8.9; GFR>60; liver normal albumin  3.6 04-08-21: glucose 148; bun 22; creat 0.89; k+ 3.7; na++ 137; ca 8.8; GFR >60; urine micro-albumin <0.3  07-23-21: urine culture: e-coli rocephin 08-06-21: glucose 139; bun 20; creat 0.97; k+ 3.8; na++ 137; ca 8.9; GFR 59; mag 1.9  09-30-21: wbc 9.4; hgb 11.2; hct 36.2; mcv 85.4 plt 207; glucose 153; bun 18; creat 0.88; k+ 3.6; na++ 136; ca 8.7; GFR>60 liver normal albumin 3.6; hgb a1c 7.2; chol 124; ldl 50; trig 154; hdl 43; vit B 12: 1086  NO NEW LABS.   Review of Systems  Constitutional:  Negative for malaise/fatigue.  Respiratory:  Negative for cough and shortness of breath.   Cardiovascular:  Negative for chest pain, palpitations and leg swelling.  Gastrointestinal:  Negative for abdominal pain, constipation and heartburn.  Musculoskeletal:  Negative for back pain, joint pain and myalgias.  Skin: Negative.   Neurological:  Negative for dizziness.  Psychiatric/Behavioral:  The patient is not nervous/anxious.    Physical Exam Constitutional:      General: She is not in acute distress.    Appearance: She is well-developed. She is obese. She is not diaphoretic.  HENT:     Nose: Nose normal.     Mouth/Throat:     Mouth: Mucous membranes are moist.     Pharynx: Oropharynx is clear.  Eyes:     Conjunctiva/sclera: Conjunctivae normal.  Neck:     Thyroid: No thyromegaly.  Cardiovascular:     Rate and Rhythm: Normal rate and regular rhythm.     Heart sounds: Normal heart sounds.  Pulmonary:     Effort: Pulmonary effort is normal. No respiratory distress.     Breath sounds: Normal breath sounds.  Abdominal:      General: Bowel sounds are normal. There is no distension.     Palpations: Abdomen is soft.     Tenderness: There is no abdominal tenderness.  Musculoskeletal:        General: Normal range of motion.     Cervical back: Neck supple.     Right lower leg: No edema.     Left lower leg: No edema.  Lymphadenopathy:     Cervical: No cervical adenopathy.  Skin:    General: Skin is warm and dry.  Neurological:     Mental Status: She is alert and oriented to person, place, and time.  Psychiatric:        Mood and Affect: Mood normal.       ASSESSMENT/ PLAN:  TODAY  Panic attack: is worse will begin zoloft 25 mg daily and will monitor her response.     Ok Edwards NP [facility]   call [phone]

## 2022-01-20 DIAGNOSIS — F410 Panic disorder [episodic paroxysmal anxiety] without agoraphobia: Secondary | ICD-10-CM | POA: Insufficient documentation

## 2022-01-20 DIAGNOSIS — Z1159 Encounter for screening for other viral diseases: Secondary | ICD-10-CM | POA: Diagnosis not present

## 2022-01-20 DIAGNOSIS — F0390 Unspecified dementia without behavioral disturbance: Secondary | ICD-10-CM | POA: Diagnosis not present

## 2022-01-27 DIAGNOSIS — F0390 Unspecified dementia without behavioral disturbance: Secondary | ICD-10-CM | POA: Diagnosis not present

## 2022-01-27 DIAGNOSIS — Z1159 Encounter for screening for other viral diseases: Secondary | ICD-10-CM | POA: Diagnosis not present

## 2022-02-03 ENCOUNTER — Non-Acute Institutional Stay (SKILLED_NURSING_FACILITY): Payer: Medicare Other | Admitting: Internal Medicine

## 2022-02-03 ENCOUNTER — Encounter: Payer: Self-pay | Admitting: Internal Medicine

## 2022-02-03 DIAGNOSIS — E1169 Type 2 diabetes mellitus with other specified complication: Secondary | ICD-10-CM

## 2022-02-03 DIAGNOSIS — I700 Atherosclerosis of aorta: Secondary | ICD-10-CM | POA: Diagnosis not present

## 2022-02-03 DIAGNOSIS — D649 Anemia, unspecified: Secondary | ICD-10-CM | POA: Diagnosis not present

## 2022-02-03 DIAGNOSIS — N3281 Overactive bladder: Secondary | ICD-10-CM

## 2022-02-03 DIAGNOSIS — F0390 Unspecified dementia without behavioral disturbance: Secondary | ICD-10-CM | POA: Diagnosis not present

## 2022-02-03 DIAGNOSIS — E1122 Type 2 diabetes mellitus with diabetic chronic kidney disease: Secondary | ICD-10-CM

## 2022-02-03 DIAGNOSIS — E1151 Type 2 diabetes mellitus with diabetic peripheral angiopathy without gangrene: Secondary | ICD-10-CM | POA: Diagnosis not present

## 2022-02-03 DIAGNOSIS — Z1159 Encounter for screening for other viral diseases: Secondary | ICD-10-CM | POA: Diagnosis not present

## 2022-02-03 DIAGNOSIS — N183 Chronic kidney disease, stage 3 unspecified: Secondary | ICD-10-CM

## 2022-02-03 DIAGNOSIS — E785 Hyperlipidemia, unspecified: Secondary | ICD-10-CM | POA: Diagnosis not present

## 2022-02-03 NOTE — Patient Instructions (Signed)
See assessment and plan under each diagnosis in the problem list and acutely for this visit 

## 2022-02-03 NOTE — Assessment & Plan Note (Signed)
Despite Myrbetriq she continues describe urinary frequency greatest at night which is disturbing sleep.  She seems to indicate she may have uterine prolapse.  Gynecological referral will be discussed with NP.

## 2022-02-03 NOTE — Assessment & Plan Note (Signed)
Accu-Cheks are done twice monthly.  The most recent value was 160.  Current A1c is 7.2% indicating excellent control.  No change indicated.

## 2022-02-03 NOTE — Assessment & Plan Note (Signed)
Current LDL is at goal with a value of 50.  No change indicated.

## 2022-02-03 NOTE — Assessment & Plan Note (Signed)
Present H/H is 11.2/36.2 with normochromic, normocytic indices.  This is essentially stable as prior values were 11.5/37.2.  No bleeding dyscrasias reported by staff.

## 2022-02-03 NOTE — Progress Notes (Signed)
NURSING HOME LOCATION:  [facility] ROOM NUMBER:  [place]:  Full Code  PCP: Ok Edwards, NP  This is a nursing facility follow up visit of chronic medical diagnoses & to document compliance with Regulation 483.30 (c) in [employer] Manual Phase 2 which mandates caregiver visit ( visits can alternate among physician, PA or NP as per statutes) within 10 days of 30 days / 60 days/ 90 days post admission to SNF date    Interim medical record and care since last SNF visit was updated with review of diagnostic studies and change in clinical status since last visit were documented.  HPI: She is a permanent resident of the facility with medical diagnoses of diabetes with neuropathy, GERD, dyslipidemia, essential hypertension, and recurrent UTIs.  Most recent labs were performed 09/30/2021.  Creatinine was 0.88 with a GFR greater than 60.  LDL is at goal with a value of 50.  Normochromic normocytic anemia is present with H/H of 11.2/36.2.  Diabetic control is good with an A1c of 7.2%.  Review of systems: Her major complaint is urinary frequency particularly at night.  This interferes with her sleep.  She states that her "womb has fallen".  There is no history of uterine prolapse and she states that this has never been evaluated by a Gynecologist.  The symptoms persist despite Myrbetriq 50 mg daily. She also describes numbness in her hands if she holds items for a long period of time such as playing Bingo.  She also has chronic numbness in the feet. She denies any other active symptoms.  Constitutional: No fever, significant weight change, fatigue  Eyes: No redness, discharge, pain, vision change ENT/mouth: No nasal congestion,  purulent discharge, earache, change in hearing, sore throat  Cardiovascular: No chest pain, palpitations, paroxysmal nocturnal dyspnea, claudication, edema  Respiratory: No cough, sputum production, hemoptysis, DOE, significant  snoring, apnea   Gastrointestinal: No heartburn, dysphagia, abdominal pain, nausea /vomiting, rectal bleeding, melena, change in bowels Genitourinary: No dysuria, hematuria, pyuria, incontinence Musculoskeletal: No joint stiffness, joint swelling, weakness, pain Dermatologic: No rash, pruritus, change in appearance of skin Neurologic: No dizziness, headache, syncope, seizures Psychiatric: No significant anxiety, depression, anorexia Endocrine: No change in hair/skin/nails, excessive thirst, excessive hunger, excessive urination  Hematologic/lymphatic: No significant bruising, lymphadenopathy, abnormal bleeding Allergy/immunology: No itchy/watery eyes, significant sneezing, urticaria, angioedema  Physical exam:  Pertinent or positive findings: She is hard of hearing.  Her eyes are light blue but there were flecks of brown in the left iris.  She has only 3 lower teeth remaining.  There is a brief grade 1/2 systolic murmur at the left base.  Second heart sound is increased.  Abdomen is protuberant.  Dorsalis pedis pulses are stronger than posterior tibial pulses.  Tinel's sign is negative at the wrists  General appearance: Adequately nourished; no acute distress, increased work of breathing is present.   Lymphatic: No lymphadenopathy about the head, neck, axilla. Eyes: No conjunctival inflammation or lid edema is present. There is no scleral icterus. Ears:  External ear exam shows no significant lesions or deformities.   Nose:  External nasal examination shows no deformity or inflammation. Nasal mucosa are pink and moist without lesions, exudates Neck:  No thyromegaly, masses, tenderness noted.    Heart:  Normal rate and regular rhythm. S1 normal without gallop, click, rub .  Lungs: Chest clear to auscultation without wheezes, rhonchi, rales, rubs. Abdomen: Bowel sounds are normal. Abdomen is soft  and nontender with no organomegaly, hernias, masses. GU: Deferred  Extremities:  No cyanosis,  clubbing, edema  Skin: Warm & dry w/o tenting. No significant lesions or rash.  See summary under each active problem in the Problem List with associated updated therapeutic plan

## 2022-02-03 NOTE — Assessment & Plan Note (Signed)
She denies any anginal symptoms.

## 2022-02-03 NOTE — Assessment & Plan Note (Signed)
Current creatinine is 0.88 with a GFR greater than 60 indicating CKD stage II.  No change in medications indicated.

## 2022-03-02 ENCOUNTER — Non-Acute Institutional Stay (SKILLED_NURSING_FACILITY): Payer: Medicare Other | Admitting: Adult Health

## 2022-03-02 ENCOUNTER — Encounter: Payer: Self-pay | Admitting: Adult Health

## 2022-03-02 DIAGNOSIS — I700 Atherosclerosis of aorta: Secondary | ICD-10-CM | POA: Diagnosis not present

## 2022-03-02 DIAGNOSIS — I152 Hypertension secondary to endocrine disorders: Secondary | ICD-10-CM | POA: Diagnosis not present

## 2022-03-02 DIAGNOSIS — E1151 Type 2 diabetes mellitus with diabetic peripheral angiopathy without gangrene: Secondary | ICD-10-CM | POA: Diagnosis not present

## 2022-03-02 DIAGNOSIS — E1159 Type 2 diabetes mellitus with other circulatory complications: Secondary | ICD-10-CM | POA: Diagnosis not present

## 2022-03-07 NOTE — Progress Notes (Signed)
? ?Location:  [facility] ?Nursing Home Room Number: 12/23/19 ?Place of Service:  SNF (31) ? ? ?CODE STATUS: full  ? ?Allergies  ?Allergen Reactions  ? Lisinopril   ? Penicillins Nausea Only  ?  Has patient had a PCN reaction causing immediate rash, facial/tongue/throat swelling, SOB or lightheadedness with hypotension: Yes ?Has patient had a PCN reaction causing severe rash involving mucus membranes or skin necrosis: No ?Has patient had a PCN reaction that required hospitalization No ?Has patient had a PCN reaction occurring within the last 10 years: No ?If all of the above answers are "NO", then may proceed with Cephalosporin use. ?  ? Sulfa Antibiotics Nausea And Vomiting  ? ? ?Chief Complaint  ?Patient presents with  ? Medical Management of Chronic Issues  ?             Hypertension associated with type 2 diabetes mellitus: . 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 ? ?HPI: ? ?She is a 81 year old long term resident of this facility being seen for the management of her chronic illnesses: Hypertension associated with type 2 diabetes mellitus: . 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 There are no reports of uncontrolled pain. No changes in appetite; weight is stable.  ? ?Past Medical History:  ?Diagnosis Date  ? Anxiety   ? Bowel obstruction (HCC)   ? Burning with urination 01/04/2016  ? Diabetes mellitus with neuropathy   ? Genital herpes   ? GERD (gastroesophageal reflux disease)   ? Hematuria 01/04/2016  ? Herpes 12/22/2015  ? Hyperlipidemia   ? Hypertension   ? LLQ abdominal tenderness 01/04/2016  ? Neuropathy   ? Pelvic pressure in female 01/04/2016  ? Recurrent UTI   ? Sciatic nerve disease   ? Small bowel obstruction (HCC) 04/27/2016  ? Urinary frequency 01/04/2016  ? Urticaria   ? ? ?Past Surgical History:   ?Procedure Laterality Date  ? ABDOMINAL HYSTERECTOMY    ? APPLICATION OF INTRAOPERATIVE CT SCAN N/A 12/19/2018  ? Procedure: APPLICATION OF INTRAOPERATIVE CT SCAN;  Surgeon: Maeola Harman, MD;  Location: [facility] OR;  Service: Neurosurgery;  Laterality: N/A;  ? bowel obstruction    ? COLON SURGERY    ? blocked colon  ? POSTERIOR CERVICAL FUSION/FORAMINOTOMY N/A 12/19/2018  ? Procedure: Cervical Seven to Thoracic One Posterior cervical fusion;  Surgeon: Maeola Harman, MD;  Location: [facility] OR;  Service: Neurosurgery;  Laterality: N/A;  Cervical Seven to Thoracic One Posterior cervical fusion  ? ? ?Social History  ? ?Socioeconomic History  ? Marital status: Widowed  ?  Spouse name: Not on file  ? Number of children: Not on file  ? Years of education: Not on file  ? Highest education level: Not on file  ?Occupational History  ? Occupation: retired   ?Tobacco Use  ? Smoking status: Never  ? Smokeless tobacco: Current  ?  Types: Snuff  ?Vaping Use  ? Vaping Use: Never used  ?Substance and Sexual Activity  ? Alcohol use: No  ? Drug use: No  ? Sexual activity: Not Currently  ?  Birth control/protection: Surgical  ?  Comment: hyst  ?Other Topics Concern  ? Not on file  ?Social History Narrative  ? Long term resident of [facility]   ? ?Social Determinants of Health  ? ?Financial Resource Strain: Not on file  ?Food Insecurity: Not on file  ?  Transportation Needs: Not on file  ?Physical Activity: Not on file  ?Stress: Not on file  ?Social Connections: Not on file  ?Intimate Partner Violence: Not on file  ? ?Family History  ?Problem Relation Age of Onset  ? Diabetes Mother   ? Heart disease Brother   ? Hypertension Brother   ? Diabetes Brother   ? Hypertension Daughter   ? Diabetes Brother   ? Diabetes Brother   ? Alzheimer's disease Brother   ? Anxiety disorder Daughter   ? ? ? ? ?VITAL SIGNS ?BP 140/82   Pulse 73   Temp 97.7 ?F (36.5 ?C)   Resp 16   Ht 5' (1.524 m)   Wt 185 lb 12.8 oz (84.3 kg)   SpO2 98%   BMI 36.29 kg/m?  ? ?Outpatient  Encounter Medications as of 03/02/2022  ?Medication Sig  ? acetaminophen (TYLENOL) 650 MG CR tablet Take 650 mg by mouth every 8 (eight) hours.  ? amLODipine (NORVASC) 10 MG tablet Take 1 tablet (10 mg total) by mouth daily.  ? aspirin 81 MG chewable tablet Chew 81 mg by mouth daily.  ? Cranberry 500 MG CAPS Take by mouth in the morning and at bedtime. For UTI prevention  ? cyanocobalamin 1000 MCG tablet Take 1,000 mcg by mouth daily. Special Instructions: for vit B 12 deficiency  ? Dextromethorphan-guaiFENesin 10-100 MG/5ML liquid Take 5 mLs by mouth at bedtime as needed.  ? diclofenac Sodium (VOLTAREN) 1 % GEL Apply 2 g topically 2 (two) times daily as needed.  ? DULoxetine (CYMBALTA) 20 MG capsule Take 20 mg by mouth 2 (two) times daily.  ? lamoTRIgine (LAMICTAL) 25 MG tablet Take 25 mg by mouth 2 (two) times daily.   ? linagliptin (TRADJENTA) 5 MG TABS tablet Take 5 mg by mouth daily.  ? loratadine (CLARITIN) 10 MG tablet Take 10 mg by mouth daily.  ? losartan (COZAAR) 25 MG tablet Take 12.5 mg by mouth daily.  ? Melatonin 5 MG TABS Take 5 mg by mouth at bedtime.  ? metFORMIN (GLUCOPHAGE) 1000 MG tablet Take 1,000 mg by mouth 2 (two) times daily with a meal.   ? mirabegron ER (MYRBETRIQ) 50 MG TB24 tablet Take 50 mg by mouth daily.  ? montelukast (SINGULAIR) 10 MG tablet Take 1 tablet (10 mg total) by mouth at bedtime.  ? NON FORMULARY Diet:  ?Consistent Carbohydrate,  ? omeprazole (PRILOSEC) 20 MG capsule Take 20 mg by mouth daily. Special Instructions:  *TAKE ON AN EMPTY STOMACH* *DO NOT CRUSH*  ? polyethylene glycol (MIRALAX / GLYCOLAX) packet Take 17 g by mouth daily.  ? PRESCRIPTION MEDICATION MICROGUARD POWDER  ?powder; amt: SPARINGLY; topical ?Special Instructions: REDNESS UNDER BREAST ?Once A Day  ? PRESCRIPTION MEDICATION annusol cream  ?cream; amt: sparingly; rectal ?Special Instructions: hemmorhoids ?Once A Day - PRN  ? senna-docusate (SENOKOT-S) 8.6-50 MG tablet Take 1 tablet by mouth at bedtime.   ?  sertraline (ZOLOFT) 25 MG tablet Take 25 mg by mouth daily.  ? simvastatin (ZOCOR) 10 MG tablet Take 10 mg by mouth at bedtime.   ? triamcinolone (NASACORT) 55 MCG/ACT AERO nasal inhaler Place 1 spray into the nose daily.  ? valACYclovir (VALTREX) 500 MG tablet Take 500 mg by mouth daily.   ? [DISCONTINUED] PRESCRIPTION MEDICATION MICROGUARD POWDER ?Special Instructions: REDNESS UNDER BREAST ?Once A Day - PRN  ? ?No facility-administered encounter medications on file as of 03/02/2022.  ? ? ? ?SIGNIFICANT DIAGNOSTIC EXAMS ? ?PREVIOUS ? ?07-16-20 DEXA: t  score 0.825  ? ?NO NEW EXAMS.  ? ?LABS REVIEWED PREVIOUS;  ?  ?02-26-21: urine culture: 80,000 e-coli ?03-18-21: hgb a1c 7.5 chol 169; ldl 70; trig 222; hdl 55 ?5-46-50:PTW 8.6; hgb 11.5; hct 37.2; mcv 87.7 plt 184; glucose 180; bun 18; creat 0.85; k+ 3.7; na++ 135; ca 8.9; GFR>60; liver normal albumin 3.6 ?04-08-21: glucose 148; bun 22; creat 0.89; k+ 3.7; na++ 137; ca 8.8; GFR >60; urine micro-albumin <0.3  ?07-23-21: urine culture: e-coli rocephin ?08-06-21: glucose 139; bun 20; creat 0.97; k+ 3.8; na++ 137; ca 8.9; GFR 59; mag 1.9  ?09-30-21: wbc 9.4; hgb 11.2; hct 36.2; mcv 85.4 plt 207; glucose 153; bun 18; creat 0.88; k+ 3.6; na++ 136; ca 8.7; GFR>60 liver normal albumin 3.6; hgb a1c 7.2; chol 124; ldl 50; trig 154; hdl 43; vit B 12: 1086 ? ?NO NEW LABS.  ? ?Review of Systems  ?Constitutional:  Negative for malaise/fatigue.  ?Respiratory:  Negative for cough and shortness of breath.   ?Cardiovascular:  Negative for chest pain, palpitations and leg swelling.  ?Gastrointestinal:  Negative for abdominal pain, constipation and heartburn.  ?Musculoskeletal:  Negative for back pain, joint pain and myalgias.  ?Skin: Negative.   ?Neurological:  Negative for dizziness.  ?Psychiatric/Behavioral:  The patient is not nervous/anxious.   ?   ?Physical Exam ?Constitutional:   ?   General: She is not in acute distress. ?   Appearance: She is well-developed. She is obese. She is not  diaphoretic.  ?Neck:  ?   Thyroid: No thyromegaly.  ?Cardiovascular:  ?   Rate and Rhythm: Normal rate and regular rhythm.  ?   Pulses: Normal pulses.  ?   Heart sounds: Normal heart sounds.  ?Pulmonary:  ?   Effort: Pul

## 2022-03-11 ENCOUNTER — Non-Acute Institutional Stay (SKILLED_NURSING_FACILITY): Payer: Medicare Other | Admitting: Adult Health

## 2022-03-11 ENCOUNTER — Encounter: Payer: Self-pay | Admitting: Adult Health

## 2022-03-11 DIAGNOSIS — Z0000 Encounter for general adult medical examination without abnormal findings: Secondary | ICD-10-CM

## 2022-03-11 NOTE — Progress Notes (Signed)
? ? ?Subjective:  ? [name] [name] is a 81 y.o. female who presents for Medicare Annual (Subsequent) preventive examination. ? ?Review of Systems    ?Review of Systems  ?Constitutional:  Negative for malaise/fatigue.  ?Respiratory:  Negative for cough and shortness of breath.   ?Cardiovascular:  Negative for chest pain, palpitations and leg swelling.  ?Gastrointestinal:  Negative for abdominal pain, constipation and heartburn.  ?Musculoskeletal:  Negative for back pain, joint pain and myalgias.  ?Skin: Negative.   ?Neurological:  Negative for dizziness.  ?Psychiatric/Behavioral:  The patient is not nervous/anxious.   ? ?Cardiac Risk Factors include: advanced age (>12men, >28 women);diabetes mellitus;hypertension;dyslipidemia;obesity (BMI >30kg/m2);sedentary lifestyle ? ?   ?Objective:  ?  ?Today's Vitals  ? 03/11/22 1504 03/16/22 1349  ?BP: (!) 135/59   ?Pulse: 78   ?Temp: 98 ?F (36.7 ?C)   ?Weight: 182 lb 3.2 oz (82.6 kg)   ?Height: 5' (1.524 m)   ?PainSc:  0-No pain  ? ?Body mass index is 35.58 kg/m?. ? ? ?  03/11/2022  ?  3:11 PM 03/02/2022  ? 10:01 AM 12/24/2021  ? 11:11 AM 12/20/2021  ?  9:36 AM 11/08/2021  ? 10:50 AM 10/01/2021  ?  4:02 PM 09/27/2021  ?  3:57 PM  ?Advanced Directives  ?Does Patient Have a Medical Advance Directive? No No No No No No No  ?Does patient want to make changes to medical advance directive? No - Patient declined No - Patient declined No - Patient declined No - Patient declined No - Patient declined  No - Patient declined  ? ? ?Current Medications (verified) ?Outpatient Encounter Medications as of 03/11/2022  ?Medication Sig  ? acetaminophen (TYLENOL) 650 MG CR tablet Take 650 mg by mouth every 8 (eight) hours.  ? amLODipine (NORVASC) 10 MG tablet Take 1 tablet (10 mg total) by mouth daily.  ? aspirin 81 MG chewable tablet Chew 81 mg by mouth daily.  ? Cranberry 500 MG CAPS Take by mouth in the morning and at bedtime. For UTI prevention  ? cyanocobalamin 1000 MCG tablet Take 1,000 mcg by  mouth daily. Special Instructions: for vit B 12 deficiency  ? Dextromethorphan-guaiFENesin 10-100 MG/5ML liquid Take 5 mLs by mouth at bedtime as needed.  ? diclofenac Sodium (VOLTAREN) 1 % GEL Apply 2 g topically 2 (two) times daily as needed.  ? DULoxetine (CYMBALTA) 20 MG capsule Take 20 mg by mouth 2 (two) times daily.  ? lamoTRIgine (LAMICTAL) 25 MG tablet Take 25 mg by mouth 2 (two) times daily.   ? linagliptin (TRADJENTA) 5 MG TABS tablet Take 5 mg by mouth daily.  ? loratadine (CLARITIN) 10 MG tablet Take 10 mg by mouth daily.  ? losartan (COZAAR) 25 MG tablet Take 12.5 mg by mouth daily.  ? Melatonin 5 MG TABS Take 5 mg by mouth at bedtime.  ? metFORMIN (GLUCOPHAGE) 1000 MG tablet Take 1,000 mg by mouth 2 (two) times daily with a meal.   ? mirabegron ER (MYRBETRIQ) 50 MG TB24 tablet Take 50 mg by mouth daily.  ? montelukast (SINGULAIR) 10 MG tablet Take 1 tablet (10 mg total) by mouth at bedtime.  ? NON FORMULARY Diet: Consistent Carbohydrate  ? omeprazole (PRILOSEC) 20 MG capsule Take 20 mg by mouth daily. Special Instructions:  *TAKE ON AN EMPTY STOMACH* *DO NOT CRUSH*  ? polyethylene glycol (MIRALAX / GLYCOLAX) packet Take 17 g by mouth daily.  ? PRESCRIPTION MEDICATION MICROGUARD POWDER  ?powder; amt: SPARINGLY; topical ?Special Instructions: REDNESS  UNDER BREAST ?Once A Day  ? PRESCRIPTION MEDICATION annusol cream  ?cream; amt: sparingly; rectal ?Special Instructions: hemmorhoids ?Once A Day - PRN  ? senna-docusate (SENOKOT-S) 8.6-50 MG tablet Take 1 tablet by mouth at bedtime.   ? sertraline (ZOLOFT) 25 MG tablet Take 25 mg by mouth daily.  ? simvastatin (ZOCOR) 10 MG tablet Take 10 mg by mouth at bedtime.   ? triamcinolone (NASACORT) 55 MCG/ACT AERO nasal inhaler Place 1 spray into the nose daily.  ? valACYclovir (VALTREX) 500 MG tablet Take 500 mg by mouth daily.   ? ?No facility-administered encounter medications on file as of 03/11/2022.  ? ? ?Allergies (verified) ?Lisinopril, Penicillins, and  Sulfa antibiotics  ? ?History: ?Past Medical History:  ?Diagnosis Date  ? Anxiety   ? Bowel obstruction (HCC)   ? Burning with urination 01/04/2016  ? Diabetes mellitus with neuropathy   ? Genital herpes   ? GERD (gastroesophageal reflux disease)   ? Hematuria 01/04/2016  ? Herpes 12/22/2015  ? Hyperlipidemia   ? Hypertension   ? LLQ abdominal tenderness 01/04/2016  ? Neuropathy   ? Pelvic pressure in female 01/04/2016  ? Recurrent UTI   ? Sciatic nerve disease   ? Small bowel obstruction (HCC) 04/27/2016  ? Urinary frequency 01/04/2016  ? Urticaria   ? ?Past Surgical History:  ?Procedure Laterality Date  ? ABDOMINAL HYSTERECTOMY    ? APPLICATION OF INTRAOPERATIVE CT SCAN N/A 12/19/2018  ? Procedure: APPLICATION OF INTRAOPERATIVE CT SCAN;  Surgeon: Maeola HarmanStern, Joseph, MD;  [facility];  Service: Neurosurgery;  Laterality: N/A;  ? bowel obstruction    ? COLON SURGERY    ? blocked colon  ? POSTERIOR CERVICAL FUSION/FORAMINOTOMY N/A 12/19/2018  ? Procedure: Cervical Seven to Thoracic One Posterior cervical fusion;  Surgeon: Maeola HarmanStern, Joseph, MD;  [facility];  Service: Neurosurgery;  Laterality: N/A;  Cervical Seven to Thoracic One Posterior cervical fusion  ? ?Family History  ?Problem Relation Age of Onset  ? Diabetes Mother   ? Heart disease Brother   ? Hypertension Brother   ? Diabetes Brother   ? Hypertension Daughter   ? Diabetes Brother   ? Diabetes Brother   ? Alzheimer's disease Brother   ? Anxiety disorder Daughter   ? ?Social History  ? ?Socioeconomic History  ? Marital status: Widowed  ?  Spouse name: Not on file  ? Number of children: Not on file  ? Years of education: Not on file  ? Highest education level: Not on file  ?Occupational History  ? Occupation: retired   ?Tobacco Use  ? Smoking status: Never  ? Smokeless tobacco: Current  ?  Types: Snuff  ?Vaping Use  ? Vaping Use: Never used  ?Substance and Sexual Activity  ? Alcohol use: No  ? Drug use: No  ? Sexual activity: Not Currently  ?  Birth  control/protection: Surgical  ?  Comment: hyst  ?Other Topics Concern  ? Not on file  ?Social History Narrative  ? [facility]   ? ?Social Determinants of Health  ? ?Financial Resource Strain: Not on file  ?Food Insecurity: Not on file  ?Transportation Needs: Not on file  ?Physical Activity: Not on file  ?Stress: Not on file  ?Social Connections: Not on file  ? ? ?Tobacco Counseling ?Ready to quit: Not Answered ?Counseling given: Not Answered ? ? ?Clinical Intake: ? ?Pre-visit preparation completed: Yes ? ?Pain : No/denies pain ?Pain Score: 0-No pain ? ?  ? ?BMI -  recorded: 35.58 ?Nutritional Status: BMI > 30  Obese ?Nutritional Risks: Unintentional weight gain ?Diabetes: Yes ?CBG done?: Yes ?CBG resulted in Enter/ Edit results?: Yes ?Did pt. bring in CBG monitor from home?: No ? ?How often do you need to have someone help you when you read instructions, pamphlets, or other written materials from your doctor or pharmacy?: 5 - Always ? ?Diabetic?yes ? ?Interpreter Needed?: No ? ?  ? ? ?Activities of Daily Living ? ?  03/16/2022  ?  1:51 PM  ?In your present state of health, do you have any difficulty performing the following activities:  ?Hearing? 1  ?Vision? 0  ?Difficulty concentrating or making decisions? 0  ?Walking or climbing stairs? 0  ?Dressing or bathing? 0  ?Doing errands, shopping? 1  ?Preparing Food and eating ? Y  ?Using the Toilet? Y  ?In the past six months, have you accidently leaked urine? Y  ?Do you have problems with loss of bowel control? Y  ?Managing your Medications? Y  ?Managing your Finances? Y  ?Housekeeping or managing your Housekeeping? Y  ? ? ?Patient Care Team: ?Sharee Holster, NP as PCP - General (Geriatric Medicine) ?[facility] ? ?Indicate any recent Medical Services you may have received from other than [facility] providers in the past year (date may be approximate). ? ?   ?Assessment:  ? This is a routine wellness examination for  [place]. ? ?Hearing/Vision screen ?No results found. ? ?Dietary issues and exercise activities discussed: ?Current Exercise Habits: The patient does not participate in regular exercise at present, Exercise limited by: None ide

## 2022-03-14 ENCOUNTER — Other Ambulatory Visit (HOSPITAL_COMMUNITY)
Admission: RE | Admit: 2022-03-14 | Discharge: 2022-03-14 | Disposition: A | Discharge status: Home / Self Care | Payer: Medicare Other | Source: Skilled Nursing Facility | Attending: Adult Health | Admitting: Adult Health

## 2022-03-14 DIAGNOSIS — Z7984 Long term (current) use of oral hypoglycemic drugs: Secondary | ICD-10-CM | POA: Insufficient documentation

## 2022-03-14 DIAGNOSIS — E119 Type 2 diabetes mellitus without complications: Secondary | ICD-10-CM | POA: Diagnosis not present

## 2022-03-15 LAB — HEMOGLOBIN A1C
Hgb A1c MFr Bld: 7.2 % — ABNORMAL HIGH (ref 4.8–5.6)
Mean Plasma Glucose: 160 mg/dL

## 2022-03-16 NOTE — Patient Instructions (Signed)
?  Ms. Klier , ?Thank you for taking time to come for your Medicare Wellness Visit. I appreciate your ongoing commitment to your health goals. Please review the following plan we discussed and let me know if I can assist you in the future.  ? ?These are the goals we discussed: ? Goals   ? ?  Absence of Fall and Fall-Related Injury   ?  Evidence-based guidance:  ?Assess fall risk using a validated tool when available. Consider balance and gait impairment, muscle weakness, diminished vision or hearing, environmental hazards, presence of urinary or bowel urgency and/or incontinence.  ?Communicate fall injury risk to interprofessional healthcare team.  ?Develop a fall prevention plan with the patient and family.  ?Promote use of personal vision and auditory aids.  ?Promote reorientation, appropriate sensory stimulation, and routines to decrease risk of fall when changes in mental status are present.  ?Assess assistance level required for safe and effective self-care; consider referral for home care.  ?Encourage physical activity, such as performance of self-care at highest level of ability, strength and balance exercise program, and provision of appropriate assistive devices; refer to rehabilitation therapy.  ?Refer to community-based fall prevention program where available.  ?If fall occurs, determine the cause and revise fall injury prevention plan.  ?Regularly review medication contribution to fall risk; consider risk related to polypharmacy and age.  ?Refer to pharmacist for consultation when concerns about medications are revealed.  ?Balance adequate pain management with potential for oversedation.  ?Provide guidance related to environmental modifications.  ?Consider supplementation with Vitamin D.   ?Notes:  ?  ?  Follow up with Primary Care Provider   ?  General - Client will not be readmitted within 30 days ([facility])   ? ?  ?  ?This is a list of the screening recommended for you and due dates:  ?Health Maintenance   ?Topic Date Due  ? Urine Protein Check  04/08/2022  ? Complete foot exam   12/12/2022*  ? Flu Shot  07/12/2022  ? Hemoglobin A1C  09/13/2022  ? Eye exam for diabetics  11/17/2022  ? Tetanus Vaccine  10/14/2031  ? Pneumonia Vaccine  Completed  ? DEXA scan (bone density measurement)  Completed  ? COVID-19 Vaccine  Completed  ? Zoster (Shingles) Vaccine  Completed  ? HPV Vaccine  Aged Out  ?*Topic was postponed. The date shown is not the original due date.  ? ? ?

## 2022-03-21 DIAGNOSIS — F0390 Unspecified dementia without behavioral disturbance: Secondary | ICD-10-CM | POA: Diagnosis not present

## 2022-03-21 DIAGNOSIS — Z1159 Encounter for screening for other viral diseases: Secondary | ICD-10-CM | POA: Diagnosis not present

## 2022-03-25 ENCOUNTER — Non-Acute Institutional Stay (SKILLED_NURSING_FACILITY): Payer: Medicare Other | Admitting: Adult Health

## 2022-03-25 ENCOUNTER — Encounter: Payer: Self-pay | Admitting: Adult Health

## 2022-03-25 DIAGNOSIS — I700 Atherosclerosis of aorta: Secondary | ICD-10-CM | POA: Diagnosis not present

## 2022-03-25 DIAGNOSIS — N183 Chronic kidney disease, stage 3 unspecified: Secondary | ICD-10-CM | POA: Diagnosis not present

## 2022-03-25 DIAGNOSIS — F339 Major depressive disorder, recurrent, unspecified: Secondary | ICD-10-CM | POA: Diagnosis not present

## 2022-03-25 DIAGNOSIS — E1122 Type 2 diabetes mellitus with diabetic chronic kidney disease: Secondary | ICD-10-CM

## 2022-03-25 NOTE — Progress Notes (Signed)
?Location:  [place] ?Nursing Home Room Number: 115-W ?Place of Service:  SNF (31) ? ? ?CODE STATUS: Full Code ? ?Allergies  ?Allergen Reactions  ? Lisinopril   ? Penicillins Nausea Only  ?  Has patient had a PCN reaction causing immediate rash, facial/tongue/throat swelling, SOB or lightheadedness with hypotension: Yes ?Has patient had a PCN reaction causing severe rash involving mucus membranes or skin necrosis: No ?Has patient had a PCN reaction that required hospitalization No ?Has patient had a PCN reaction occurring within the last 10 years: No ?If all of the above answers are "NO", then may proceed with Cephalosporin use. ?  ? Sulfa Antibiotics Nausea And Vomiting  ? ? ?Chief Complaint  ?Patient presents with  ? Acute Visit  ?  Care plan meeting  ? ? ?HPI: ? ?We have come together for her care plan meeting. BIMS 13/15 mood 6/30: tired and anxious at times.  ?She is independent to supervision with her adl care. She is continent of bladder and bowel. She has not had any falls. Her weight is stable at 184 pounds; diet is CON CHO good appetite; feeds self. 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 ? ?Past Medical History:  ?Diagnosis Date  ? Anxiety   ? Bowel obstruction ([place])   ? Burning with urination 01/04/2016  ? Diabetes mellitus with neuropathy   ? Genital herpes   ? GERD (gastroesophageal reflux disease)   ? Hematuria 01/04/2016  ? Herpes 12/22/2015  ? Hyperlipidemia   ? Hypertension   ? LLQ abdominal tenderness 01/04/2016  ? Neuropathy   ? Pelvic pressure in female 01/04/2016  ? Recurrent UTI   ? Sciatic nerve disease   ? Small bowel obstruction ([place]) 04/27/2016  ? Urinary frequency 01/04/2016  ? Urticaria   ? ? ?Past Surgical History:  ?Procedure Laterality Date  ? ABDOMINAL HYSTERECTOMY    ? APPLICATION OF INTRAOPERATIVE CT SCAN N/A  12/19/2018  ? Procedure: APPLICATION OF INTRAOPERATIVE CT SCAN;  Surgeon: Erline Levine, MD;  Location: [place];  Service: Neurosurgery;  Laterality: N/A;  ? bowel obstruction    ? COLON SURGERY    ? blocked colon  ? POSTERIOR CERVICAL FUSION/FORAMINOTOMY N/A 12/19/2018  ? Procedure: Cervical Seven to Thoracic One Posterior cervical fusion;  Surgeon: Erline Levine, MD;  Location: [place];  Service: Neurosurgery;  Laterality: N/A;  Cervical Seven to Thoracic One Posterior cervical fusion  ? ? ?Social History  ? ?Socioeconomic History  ? Marital status: Widowed  ?  Spouse name: Not on file  ? Number of children: Not on file  ? Years of education: Not on file  ? Highest education level: Not on file  ?Occupational History  ? Occupation: retired   ?Tobacco Use  ? Smoking status: Never  ? Smokeless tobacco: Current  ?  Types: Snuff  ?Vaping Use  ? Vaping Use: Never used  ?Substance and Sexual Activity  ? Alcohol use: No  ? Drug use: No  ? Sexual activity: Not Currently  ?  Birth control/protection: Surgical  ?  Comment: hyst  ?Other Topics Concern  ? Not on file  ?Social History Narrative  ? Long term resident of [facility]   ? ?Social Determinants of Health  ? ?Financial Resource Strain: Not on file  ?Food Insecurity: Not on file  ?Transportation Needs: Not on file  ?Physical  Activity: Not on file  ?Stress: Not on file  ?Social Connections: Not on file  ?Intimate Partner Violence: Not on file  ? ?Family History  ?Problem Relation Age of Onset  ? Diabetes Mother   ? Heart disease Brother   ? Hypertension Brother   ? Diabetes Brother   ? Hypertension Daughter   ? Diabetes Brother   ? Diabetes Brother   ? Alzheimer's disease Brother   ? Anxiety disorder Daughter   ? ? ? ? ?VITAL SIGNS ?BP 140/66   Pulse 75   Temp 97.7 ?F (36.5 ?C)   Ht 5' (1.524 m)   Wt 184 lb (83.5 kg)   BMI 35.94 kg/m?  ? ?Outpatient Encounter Medications as of 03/25/2022  ?Medication Sig  ? acetaminophen (TYLENOL) 650 MG CR tablet Take 650 mg by mouth in the  morning and at bedtime.  ? amLODipine (NORVASC) 10 MG tablet Take 1 tablet (10 mg total) by mouth daily.  ? aspirin 81 MG chewable tablet Chew 81 mg by mouth daily.  ? Cranberry 500 MG CAPS Take by mouth in the morning and at bedtime. For UTI prevention  ? cyanocobalamin 1000 MCG tablet Take 1,000 mcg by mouth daily. Special Instructions: for vit B 12 deficiency  ? Dextromethorphan-guaiFENesin 10-100 MG/5ML liquid Take 5 mLs by mouth at bedtime as needed.  ? diclofenac Sodium (VOLTAREN) 1 % GEL Apply 2 g topically 2 (two) times daily as needed.  ? DULoxetine (CYMBALTA) 20 MG capsule Take 20 mg by mouth 2 (two) times daily.  ? lamoTRIgine (LAMICTAL) 25 MG tablet Take 25 mg by mouth 2 (two) times daily.   ? linagliptin (TRADJENTA) 5 MG TABS tablet Take 5 mg by mouth daily.  ? loratadine (CLARITIN) 10 MG tablet Take 10 mg by mouth daily.  ? losartan (COZAAR) 25 MG tablet Take 12.5 mg by mouth daily.  ? Melatonin 5 MG TABS Take 5 mg by mouth at bedtime.  ? metFORMIN (GLUCOPHAGE) 1000 MG tablet Take 1,000 mg by mouth 2 (two) times daily with a meal.   ? mirabegron ER (MYRBETRIQ) 50 MG TB24 tablet Take 50 mg by mouth daily.  ? montelukast (SINGULAIR) 10 MG tablet Take 1 tablet (10 mg total) by mouth at bedtime.  ? NON FORMULARY Diet: Consistent Carbohydrate  ? omeprazole (PRILOSEC) 20 MG capsule Take 20 mg by mouth daily. Special Instructions:  *TAKE ON AN EMPTY STOMACH* *DO NOT CRUSH*  ? polyethylene glycol (MIRALAX / GLYCOLAX) packet Take 17 g by mouth daily.  ? PRESCRIPTION MEDICATION MICROGUARD POWDER  ?powder; amt: SPARINGLY; topical ?Special Instructions: REDNESS UNDER BREAST ?Once A Day  ? PRESCRIPTION MEDICATION annusol cream  ?cream; amt: sparingly; rectal ?Special Instructions: hemmorhoids ?Once A Day - PRN  ? senna-docusate (SENOKOT-S) 8.6-50 MG tablet Take 1 tablet by mouth at bedtime.   ? sertraline (ZOLOFT) 25 MG tablet Take 25 mg by mouth daily.  ? simvastatin (ZOCOR) 10 MG tablet Take 10 mg by mouth at  bedtime.   ? triamcinolone (NASACORT) 55 MCG/ACT AERO nasal inhaler Place 1 spray into the nose daily.  ? valACYclovir (VALTREX) 500 MG tablet Take 500 mg by mouth daily.   ? ?No facility-administered encounter medications on file as of 03/25/2022.  ? ? ? ?SIGNIFICANT DIAGNOSTIC EXAMS ? ? ?PREVIOUS ? ?07-16-20 DEXA: t score 0.825  ? ?NO NEW EXAMS.  ? ?LABS REVIEWED PREVIOUS;  ?  ?03-18-21: hgb a1c 7.5 chol 169; ldl 70; trig 222; hdl 55 ?04-05-21:wbc 8.6; hgb 11.5; hct  37.2; mcv 87.7 plt 184; glucose 180; bun 18; creat 0.85; k+ 3.7; na++ 135; ca 8.9; GFR>60; liver normal albumin 3.6 ?04-08-21: glucose 148; bun 22; creat 0.89; k+ 3.7; na++ 137; ca 8.8; GFR >60; urine micro-albumin <0.3  ?07-23-21: urine culture: e-coli rocephin ?08-06-21: glucose 139; bun 20; creat 0.97; k+ 3.8; na++ 137; ca 8.9; GFR 59; mag 1.9  ?09-30-21: wbc 9.4; hgb 11.2; hct 36.2; mcv 85.4 plt 207; glucose 153; bun 18; creat 0.88; k+ 3.6; na++ 136; ca 8.7; GFR>60 liver normal albumin 3.6; hgb a1c 7.2; chol 124; ldl 50; trig 154; hdl 43; vit B 12: 1086 ? ?NO NEW LABS.  ? ?Review of Systems  ?Constitutional:  Negative for malaise/fatigue.  ?Respiratory:  Negative for cough and shortness of breath.   ?Cardiovascular:  Negative for chest pain, palpitations and leg swelling.  ?Gastrointestinal:  Negative for abdominal pain, constipation and heartburn.  ?Musculoskeletal:  Negative for back pain, joint pain and myalgias.  ?Skin: Negative.   ?Neurological:  Negative for dizziness.  ?Psychiatric/Behavioral:  The patient is not nervous/anxious.   ? ? ?Physical Exam ?Constitutional:   ?   General: She is not in acute distress. ?   Appearance: She is well-developed. She is obese. She is not diaphoretic.  ?Neck:  ?   Thyroid: No thyromegaly.  ?Cardiovascular:  ?   Rate and Rhythm: Normal rate and regular rhythm.  ?   Pulses: Normal pulses.  ?   Heart sounds: Normal heart sounds.  ?Pulmonary:  ?   Effort: Pulmonary effort is normal. No respiratory distress.  ?   Breath  sounds: Normal breath sounds.  ?Abdominal:  ?   General: Bowel sounds are normal. There is no distension.  ?   Palpations: Abdomen is soft.  ?   Tenderness: There is no abdominal tenderness.  ?Musculoskeletal:     ?

## 2022-04-04 ENCOUNTER — Non-Acute Institutional Stay (SKILLED_NURSING_FACILITY): Payer: Medicare Other | Admitting: Adult Health

## 2022-04-04 ENCOUNTER — Encounter: Payer: Self-pay | Admitting: Adult Health

## 2022-04-04 DIAGNOSIS — R52 Pain, unspecified: Secondary | ICD-10-CM | POA: Diagnosis not present

## 2022-04-04 DIAGNOSIS — N3281 Overactive bladder: Secondary | ICD-10-CM

## 2022-04-04 DIAGNOSIS — K219 Gastro-esophageal reflux disease without esophagitis: Secondary | ICD-10-CM | POA: Diagnosis not present

## 2022-04-04 DIAGNOSIS — G8929 Other chronic pain: Secondary | ICD-10-CM

## 2022-04-04 NOTE — Progress Notes (Signed)
?Location:  [facility] ?Nursing Home Room Number: [id number] W ?Place of Service:  SNF (31) ?Provider:  Synthia InnocentGreen, Hyla Coard, NP ? ? ?CODE STATUS: FULL CODE ? ?Allergies  ?Allergen Reactions  ? Lisinopril   ? Penicillins Nausea Only  ?  Has patient had a PCN reaction causing immediate rash, facial/tongue/throat swelling, SOB or lightheadedness with hypotension: Yes ?Has patient had a PCN reaction causing severe rash involving mucus membranes or skin necrosis: No ?Has patient had a PCN reaction that required hospitalization No ?Has patient had a PCN reaction occurring within the last 10 years: No ?If all of the above answers are "NO", then may proceed with Cephalosporin use. ?  ? Sulfa Antibiotics Nausea And Vomiting  ? ? ?Chief Complaint  ?Patient presents with  ? Medical Management of Chronic Issues  ?                  Gastroesophageal reflux disease without esophagitis:  OAB Chronic generalized pain:  Chronic non-seasonal allergic rhinitis:  ? ? ?HPI: ? ?She is a 81 year old long term resident of this facility being seen for the management of her chronic illnesses: Gastroesophageal reflux disease without esophagitis:  OAB Chronic generalized pain:  Chronic non-seasonal allergic rhinitis. She is using snuff in her room. I have informed the facility. She does complain of urinary frequency getting up 2-3 times nightly. No changes in appetite.  ? ?Past Medical History:  ?Diagnosis Date  ? Anxiety   ? Bowel obstruction ([facility])   ? Burning with urination 01/04/2016  ? Diabetes mellitus with neuropathy   ? Genital herpes   ? GERD (gastroesophageal reflux disease)   ? Hematuria 01/04/2016  ? Herpes 12/22/2015  ? Hyperlipidemia   ? Hypertension   ? LLQ abdominal tenderness 01/04/2016  ? Neuropathy   ? Pelvic pressure in female 01/04/2016  ? Recurrent UTI   ? Sciatic nerve disease   ? Small bowel obstruction ([facility]) 04/27/2016  ? Urinary frequency 01/04/2016  ? Urticaria   ? ? ?Past Surgical History:  ?Procedure Laterality Date  ?  ABDOMINAL HYSTERECTOMY    ? APPLICATION OF INTRAOPERATIVE CT SCAN N/A 12/19/2018  ? Procedure: APPLICATION OF INTRAOPERATIVE CT SCAN;  Surgeon: Maeola HarmanStern, Joseph, MD;  [facility];  Service: Neurosurgery;  Laterality: N/A;  ? bowel obstruction    ? COLON SURGERY    ? blocked colon  ? POSTERIOR CERVICAL FUSION/FORAMINOTOMY N/A 12/19/2018  ? Procedure: Cervical Seven to Thoracic One Posterior cervical fusion;  Surgeon: Maeola HarmanStern, Joseph, MD;  [facility];  Service: Neurosurgery;  Laterality: N/A;  Cervical Seven to Thoracic One Posterior cervical fusion  ? ? ?Social History  ? ?Socioeconomic History  ? Marital status: Widowed  ?  Spouse name: Not on file  ? Number of children: Not on file  ? Years of education: Not on file  ? Highest education level: Not on file  ?Occupational History  ? Occupation: retired   ?Tobacco Use  ? Smoking status: Never  ? Smokeless tobacco: Current  ?  Types: Snuff  ?Vaping Use  ? Vaping Use: Never used  ?Substance and Sexual Activity  ? Alcohol use: No  ? Drug use: No  ? Sexual activity: Not Currently  ?  Birth control/protection: Surgical  ?  Comment: hyst  ?Other Topics Concern  ? Not on file  ?Social History Narrative  ? [facility]   ? ?Social Determinants of Health  ? ?Financial Resource Strain: Not on file  ?  Food Insecurity: Not on file  ?Transportation Needs: Not on file  ?Physical Activity: Not on file  ?Stress: Not on file  ?Social Connections: Not on file  ?Intimate Partner Violence: Not on file  ? ?Family History  ?Problem Relation Age of Onset  ? Diabetes Mother   ? Heart disease Brother   ? Hypertension Brother   ? Diabetes Brother   ? Hypertension Daughter   ? Diabetes Brother   ? Diabetes Brother   ? Alzheimer's disease Brother   ? Anxiety disorder Daughter   ? ? ? ? ?VITAL SIGNS ?BP (!) 145/62   Pulse 78   Temp (!) 97.4 ?F (36.3 ?C)   Resp 20   Ht 5' (1.524 m)   Wt 184 lb (83.5 kg)   SpO2 98%   BMI 35.94 kg/m?  ? ?Outpatient Encounter Medications as of  04/04/2022  ?Medication Sig  ? acetaminophen (TYLENOL) 650 MG CR tablet Take 650 mg by mouth in the morning and at bedtime.  ? amLODipine (NORVASC) 10 MG tablet Take 1 tablet (10 mg total) by mouth daily.  ? aspirin 81 MG chewable tablet Chew 81 mg by mouth daily.  ? Cranberry 500 MG CAPS Take by mouth in the morning and at bedtime. For UTI prevention  ? cyanocobalamin 1000 MCG tablet Take 1,000 mcg by mouth daily. Special Instructions: for vit B 12 deficiency  ? Dextromethorphan-guaiFENesin 10-100 MG/5ML liquid Take 5 mLs by mouth at bedtime as needed.  ? diclofenac Sodium (VOLTAREN) 1 % GEL Apply 2 g topically 2 (two) times daily as needed.  ? DULoxetine (CYMBALTA) 20 MG capsule Take 20 mg by mouth 2 (two) times daily.  ? lamoTRIgine (LAMICTAL) 25 MG tablet Take 25 mg by mouth 2 (two) times daily.   ? linagliptin (TRADJENTA) 5 MG TABS tablet Take 5 mg by mouth daily.  ? loratadine (CLARITIN) 10 MG tablet Take 10 mg by mouth daily.  ? losartan (COZAAR) 25 MG tablet Take 12.5 mg by mouth daily.  ? Melatonin 5 MG TABS Take 5 mg by mouth at bedtime.  ? metFORMIN (GLUCOPHAGE) 1000 MG tablet Take 1,000 mg by mouth 2 (two) times daily with a meal.   ? mirabegron ER (MYRBETRIQ) 50 MG TB24 tablet Take 50 mg by mouth daily.  ? montelukast (SINGULAIR) 10 MG tablet Take 1 tablet (10 mg total) by mouth at bedtime.  ? NON FORMULARY Diet: Consistent Carbohydrate  ? omeprazole (PRILOSEC) 20 MG capsule Take 20 mg by mouth daily. Special Instructions:  *TAKE ON AN EMPTY STOMACH* *DO NOT CRUSH*  ? polyethylene glycol (MIRALAX / GLYCOLAX) packet Take 17 g by mouth daily.  ? PRESCRIPTION MEDICATION MICROGUARD POWDER  ?powder; amt: SPARINGLY; topical ?Special Instructions: REDNESS UNDER BREAST ?Once A Day  ? PRESCRIPTION MEDICATION annusol cream  ?cream; amt: sparingly; rectal ?Special Instructions: hemmorhoids ?Once A Day - PRN  ? senna-docusate (SENOKOT-S) 8.6-50 MG tablet Take 1 tablet by mouth at bedtime.   ? sertraline (ZOLOFT) 25  MG tablet Take 25 mg by mouth daily.  ? simvastatin (ZOCOR) 10 MG tablet Take 10 mg by mouth at bedtime.   ? triamcinolone (NASACORT) 55 MCG/ACT AERO nasal inhaler Place 1 spray into the nose daily.  ? valACYclovir (VALTREX) 500 MG tablet Take 500 mg by mouth daily.   ? ?No facility-administered encounter medications on file as of 04/04/2022.  ? ? ? ?SIGNIFICANT DIAGNOSTIC EXAMS ? ? ?PREVIOUS ? ?07-16-20 DEXA: t score 0.825  ? ?NO NEW EXAMS.  ? ?  LABS REVIEWED PREVIOUS;  ?  ?03-18-21: hgb a1c 7.5 chol 169; ldl 70; trig 222; hdl 55 ?1-61-09:UEA 8.6; hgb 11.5; hct 37.2; mcv 87.7 plt 184; glucose 180; bun 18; creat 0.85; k+ 3.7; na++ 135; ca 8.9; GFR>60; liver normal albumin 3.6 ?04-08-21: glucose 148; bun 22; creat 0.89; k+ 3.7; na++ 137; ca 8.8; GFR >60; urine micro-albumin <0.3  ?07-23-21: urine culture: e-coli rocephin ?08-06-21: glucose 139; bun 20; creat 0.97; k+ 3.8; na++ 137; ca 8.9; GFR 59; mag 1.9  ?09-30-21: wbc 9.4; hgb 11.2; hct 36.2; mcv 85.4 plt 207; glucose 153; bun 18; creat 0.88; k+ 3.6; na++ 136; ca 8.7; GFR>60 liver normal albumin 3.6; hgb a1c 7.2; chol 124; ldl 50; trig 154; hdl 43; vit B 12: 1086 ? ?TODAY ? ?03-14-26: hgb a1c 7.2   ? ?Review of Systems  ?Constitutional:  Negative for malaise/fatigue.  ?Respiratory:  Negative for cough and shortness of breath.   ?Cardiovascular:  Negative for chest pain, palpitations and leg swelling.  ?Gastrointestinal:  Negative for abdominal pain, constipation and heartburn.  ?Genitourinary:  Positive for urgency.  ?Musculoskeletal:  Negative for back pain, joint pain and myalgias.  ?Skin: Negative.   ?Neurological:  Negative for dizziness.  ?Psychiatric/Behavioral:  The patient is not nervous/anxious.   ? ?Physical Exam ?Constitutional:   ?   General: She is not in acute distress. ?   Appearance: She is well-developed. She is obese. She is not diaphoretic.  ?Neck:  ?   Thyroid: No thyromegaly.  ?Cardiovascular:  ?   Rate and Rhythm: Normal rate and regular rhythm.  ?    Pulses: Normal pulses.  ?   Heart sounds: Normal heart sounds.  ?Pulmonary:  ?   Effort: Pulmonary effort is normal. No respiratory distress.  ?   Breath sounds: Normal breath sounds.  ?Abdominal:  ?   General: B

## 2022-04-07 ENCOUNTER — Other Ambulatory Visit (HOSPITAL_COMMUNITY)
Admission: RE | Admit: 2022-04-07 | Discharge: 2022-04-07 | Disposition: A | Discharge status: Home / Self Care | Payer: Medicare Other | Source: Skilled Nursing Facility | Attending: Adult Health | Admitting: Adult Health

## 2022-04-07 DIAGNOSIS — E1142 Type 2 diabetes mellitus with diabetic polyneuropathy: Secondary | ICD-10-CM | POA: Diagnosis not present

## 2022-04-07 LAB — CBC
HCT: 35 % — ABNORMAL LOW (ref 36.0–46.0)
Hemoglobin: 11.1 g/dL — ABNORMAL LOW (ref 12.0–15.0)
MCH: 26.1 pg (ref 26.0–34.0)
MCHC: 31.7 g/dL (ref 30.0–36.0)
MCV: 82.4 fL (ref 80.0–100.0)
Platelets: 223 10*3/uL (ref 150–400)
RBC: 4.25 MIL/uL (ref 3.87–5.11)
RDW: 15.9 % — ABNORMAL HIGH (ref 11.5–15.5)
WBC: 10.9 10*3/uL — ABNORMAL HIGH (ref 4.0–10.5)
nRBC: 0 % (ref 0.0–0.2)

## 2022-04-07 LAB — COMPREHENSIVE METABOLIC PANEL
ALT: 15 U/L (ref 0–44)
AST: 15 U/L (ref 15–41)
Albumin: 4.1 g/dL (ref 3.5–5.0)
Alkaline Phosphatase: 83 U/L (ref 38–126)
Anion gap: 11 (ref 5–15)
BUN: 15 mg/dL (ref 8–23)
CO2: 28 mmol/L (ref 22–32)
Calcium: 9.2 mg/dL (ref 8.9–10.3)
Chloride: 100 mmol/L (ref 98–111)
Creatinine, Ser: 0.8 mg/dL (ref 0.44–1.00)
GFR, Estimated: >60 mL/min (ref >60)
Glucose, Bld: 147 mg/dL — ABNORMAL HIGH (ref 70–99)
Potassium: 3.6 mmol/L (ref 3.5–5.1)
Sodium: 139 mmol/L (ref 135–145)
Total Bilirubin: 0.3 mg/dL (ref 0.3–1.2)
Total Protein: 7.3 g/dL (ref 6.5–8.1)

## 2022-04-07 LAB — LIPID PANEL
Cholesterol: 134 mg/dL (ref 0–200)
HDL: 51 mg/dL (ref >40)
LDL Cholesterol: 49 mg/dL (ref 0–99)
Total CHOL/HDL Ratio: 2.6 RATIO
Triglycerides: 169 mg/dL — ABNORMAL HIGH (ref <150)
VLDL: 34 mg/dL (ref 0–40)

## 2022-04-08 LAB — MICROALBUMIN / CREATININE URINE RATIO
Creatinine, Urine: 27 mg/dL
Microalb Creat Ratio: 97 mg/g creat — ABNORMAL HIGH (ref 0–29)
Microalb, Ur: 26.2 ug/mL — ABNORMAL HIGH

## 2022-04-18 ENCOUNTER — Non-Acute Institutional Stay (SKILLED_NURSING_FACILITY): Payer: Medicare Other | Admitting: Internal Medicine

## 2022-04-18 ENCOUNTER — Encounter: Payer: Self-pay | Admitting: Internal Medicine

## 2022-04-18 DIAGNOSIS — R102 Pelvic and perineal pain: Secondary | ICD-10-CM

## 2022-04-18 DIAGNOSIS — E1159 Type 2 diabetes mellitus with other circulatory complications: Secondary | ICD-10-CM | POA: Diagnosis not present

## 2022-04-18 DIAGNOSIS — I152 Hypertension secondary to endocrine disorders: Secondary | ICD-10-CM | POA: Diagnosis not present

## 2022-04-18 DIAGNOSIS — D649 Anemia, unspecified: Secondary | ICD-10-CM

## 2022-04-18 DIAGNOSIS — L989 Disorder of the skin and subcutaneous tissue, unspecified: Secondary | ICD-10-CM

## 2022-04-18 DIAGNOSIS — E1151 Type 2 diabetes mellitus with diabetic peripheral angiopathy without gangrene: Secondary | ICD-10-CM

## 2022-04-18 DIAGNOSIS — I700 Atherosclerosis of aorta: Secondary | ICD-10-CM | POA: Diagnosis not present

## 2022-04-18 NOTE — Assessment & Plan Note (Addendum)
Current A1c is 7.2% indicating adequate control in this 81 year old with multiple advanced comorbidities. ?

## 2022-04-18 NOTE — Progress Notes (Signed)
? ?NURSING HOME LOCATION:  [place] ?ROOM NUMBER: 115W ? ?CODE STATUS: Full code ? ?PCP: Ok Edwards, NP ? ?This is a nursing facility follow up visit of chronic medical diagnoses & to document compliance with Regulation 483.30 (c) in [employer] [place] Phase [employer] which mandates caregiver visit ( visits can alternate among physician, PA or NP as per statutes) within 10 days of 30 days / 60 days/ 90 days post admission to SNF date   ? ?Interim medical record and care since last SNF visit was updated with review of diagnostic studies and change in clinical status since last visit were documented. ? ?HPI: She is a permanent resident of this facility with past history of bowel obstruction, diabetes with neuropathy, GERD, dyslipidemia, essential hypertension, and anxiety. ? ?Surgical procedures include posterior cervical fusion/foraminotomy ,TAH/BSO as well as surgery for bowel obstruction. ?Current labs reveal a creatinine of 0.8 with GFR greater than 60 indicating CKD stage II.  Albumin and total protein are normal indicating adequate nutritional status.  LDL is 49 and triglycerides mildly elevated 169.  Anemia was essentially stable with H/H of 11.1/35.0. ?A1c of 7.2% is stable and indicates good control in this [age]ogenarian with multiple comorbidities.  Microalbumin is present in the urine with a value of 26.2 ? ?Review of systems: Her major complaint is pressure in the pubic area "like a urinary tract infection".  She states that she has this sensation at least 4 times a night without associated frank dysuria, hematuria, or pyuria.  This is in the context of prior history of TAH and BSO which she describes as catastrophic.  She states that she relieves the pressure by placing a glove on her hand and applying Vaseline before pushing the apparent prolapse back up.  She is also concerned about "spots in my scalp". ? ?Constitutional: No fever, significant weight change, fatigue   ?Eyes: No redness, discharge, pain, vision change ?ENT/mouth: No nasal congestion,  purulent discharge, earache, change in hearing, sore throat  ?Cardiovascular: No chest pain, palpitations, paroxysmal nocturnal dyspnea, claudication, edema  ?Respiratory: No cough, sputum production, hemoptysis, DOE, significant snoring, apnea   ?Gastrointestinal: No heartburn, dysphagia, nausea /vomiting, rectal bleeding, melena, change in bowels ?Musculoskeletal: No joint stiffness, joint swelling, weakness, pain ?Dermatologic: No rash, pruritus ?Neurologic: No dizziness, headache, syncope, seizures, numbness, tingling ?Psychiatric: No significant anxiety, depression, insomnia, anorexia ?Endocrine: No change in hair/skin/nails, excessive thirst, excessive hunger, excessive urination  ?Hematologic/lymphatic: No significant bruising, lymphadenopathy, abnormal bleeding ?Allergy/immunology: No itchy/watery eyes, significant sneezing, urticaria, angioedema ? ?Physical exam:  ?Pertinent or positive findings: She is a very gregarious; she always addresses me as "hey,Sugar".  Initially she was walking into her room and exhibited somewhat pigeon toed gait as she employed a walker.  She seemed to be talking to herself before she realized I was in the room. ?She has only 2 lower mandibular teeth remaining.  She has minor rales in the left lower lobe greater than the right.  Panniculus is present.  She has 1/2+ edema at the sock line.  Dorsalis pedis pulses are stronger than posterior tibial pulses.  I can only find 1 tiny papule in the scalp which was not associated with cellulitis. ? ?General appearance: Adequately nourished; no acute distress, increased work of breathing is present.   ?Lymphatic: No lymphadenopathy about the head, neck, axilla. ?Eyes: No conjunctival inflammation or lid edema is present. There is no scleral icterus. ?Ears:  External ear exam shows no significant lesions  or deformities.   ?Nose:  External nasal examination  shows no deformity or inflammation. Nasal mucosa are pink and moist without lesions, exudates ?Oral exam:   There is no oropharyngeal erythema or exudate. ?Neck:  No thyromegaly, masses, tenderness noted.    ?Heart:  Normal rate and regular rhythm. S1 and S2 normal without gallop, murmur, click, rub .  ?Lungs:  without wheezes, rhonchi, rubs. ?Abdomen: Bowel sounds are normal. Abdomen is soft and nontender with no organomegaly, hernias, masses. ?GU: Deferred  ?Extremities:  No cyanosis, clubbing  ?Neurologic exam : ?Cn 2-7 intact ?Strength equal  in upper & lower extremities ?Balance, Rhomberg, finger to nose testing could not be completed due to clinical state ?Deep tendon reflexes are equal ?Skin: Warm & dry w/o tenting. ?No significant lesions or rash. ? ?See summary under each active problem in the Problem List with associated updated therapeutic plan ? ? ?

## 2022-04-18 NOTE — Assessment & Plan Note (Signed)
Current LDL is 49, at goal of less than 100, ideally less than 70. ?

## 2022-04-18 NOTE — Assessment & Plan Note (Addendum)
Current systolic blood pressure of 145 is the higher range for serial blood pressures which range from 130-145. Establish & treat average BP ?

## 2022-04-18 NOTE — Assessment & Plan Note (Addendum)
Anemia is essentially stable with H/H of 11.1/35.0.  No bleeding dyscrasias reported by staff. ?

## 2022-04-18 NOTE — Patient Instructions (Signed)
See assessment and plan under each diagnosis in the problem list and acutely for this visit 

## 2022-04-19 NOTE — Assessment & Plan Note (Signed)
See 04/18/2022 pelvic pressure relieved by gloved hand pushing upward suggesting prolapse. Discuss Gyn referral for pessary with NP. ?

## 2022-05-18 ENCOUNTER — Non-Acute Institutional Stay (SKILLED_NURSING_FACILITY): Payer: Medicare Other | Admitting: Adult Health

## 2022-05-18 ENCOUNTER — Encounter: Payer: Self-pay | Admitting: Adult Health

## 2022-05-18 DIAGNOSIS — B009 Herpesviral infection, unspecified: Secondary | ICD-10-CM | POA: Diagnosis not present

## 2022-05-18 DIAGNOSIS — F339 Major depressive disorder, recurrent, unspecified: Secondary | ICD-10-CM | POA: Diagnosis not present

## 2022-05-18 DIAGNOSIS — K5909 Other constipation: Secondary | ICD-10-CM | POA: Diagnosis not present

## 2022-05-18 DIAGNOSIS — E538 Deficiency of other specified B group vitamins: Secondary | ICD-10-CM | POA: Diagnosis not present

## 2022-05-18 NOTE — Progress Notes (Signed)
Location:  [facility] Room Number: 115-W Place of Service:  SNF (31)   CODE STATUS: Full Code   Allergies  Allergen Reactions   Lisinopril    Penicillins Nausea Only    Has patient had a PCN reaction causing immediate rash, facial/tongue/throat swelling, SOB or lightheadedness with hypotension: Yes Has patient had a PCN reaction causing severe rash involving mucus membranes or skin necrosis: No Has patient had a PCN reaction that required hospitalization No Has patient had a PCN reaction occurring within the last 10 years: No If all of the above answers are "NO", then may proceed with Cephalosporin use.    Sulfa Antibiotics Nausea And Vomiting    Chief Complaint  Patient presents with   Medical Management of Chronic Issues                         Herpes:   Vitamin B12 deficiency:    Chronic constipation:   Major depression recurrent chronic    HPI:  She is a 81 year old long term resident of this facility being seen for the management of her chronic illnesses: Herpes:   Vitamin B12 deficiency:    Chronic constipation:   Major depression recurrent chronic.she has been LOA with her family. She is having right shoulder pain. There are no reports of anxiety or depressive thoughts.   Past Medical History:  Diagnosis Date   Anxiety    Bowel obstruction ([place])    Burning with urination 01/04/2016   Diabetes mellitus with neuropathy    Genital herpes    GERD (gastroesophageal reflux disease)    Hematuria 01/04/2016   Herpes 12/22/2015   Hyperlipidemia    Hypertension    LLQ abdominal tenderness 01/04/2016   Neuropathy    Pelvic pressure in female 01/04/2016   Recurrent UTI    Sciatic nerve disease    Small bowel obstruction ([place]) 04/27/2016   Urinary frequency 01/04/2016   Urticaria     Past Surgical History:  Procedure Laterality Date   ABDOMINAL HYSTERECTOMY     APPLICATION OF INTRAOPERATIVE CT SCAN N/A 12/19/2018   Procedure: APPLICATION OF  INTRAOPERATIVE CT SCAN;  Surgeon: Erline Levine, MD;  Location: [place];  Service: Neurosurgery;  Laterality: N/A;   bowel obstruction     COLON SURGERY     blocked colon   POSTERIOR CERVICAL FUSION/FORAMINOTOMY N/A 12/19/2018   Procedure: Cervical Seven to Thoracic One Posterior cervical fusion;  Surgeon: Erline Levine, MD;  Location: [place];  Service: Neurosurgery;  Laterality: N/A;  Cervical Seven to Thoracic One Posterior cervical fusion    Social History   Socioeconomic History   Marital status: Widowed    Spouse name: Not on file   Number of children: Not on file   Years of education: Not on file   Highest education level: Not on file  Occupational History   Occupation: retired   Tobacco Use   Smoking status: Never   Smokeless tobacco: Current    Types: Snuff  Vaping Use   Vaping Use: Never used  Substance and Sexual Activity   Alcohol use: No   Drug use: No   Sexual activity: Not Currently    Birth control/protection: Surgical    Comment: hyst  Other Topics Concern   Not on file  Social History Narrative   Long term resident of [facility]    Social Determinants of Health   Financial Resource Strain: Not on file  Food Insecurity:  Not on file  Transportation Needs: Not on file  Physical Activity: Not on file  Stress: Not on file  Social Connections: Not on file  Intimate Partner Violence: Not on file   Family History  Problem Relation Age of Onset   Diabetes Mother    Heart disease Brother    Hypertension Brother    Diabetes Brother    Hypertension Daughter    Diabetes Brother    Diabetes Brother    Alzheimer's disease Brother    Anxiety disorder Daughter       VITAL SIGNS BP 140/70   Pulse 68   Temp (!) 97 F (36.1 C)   Resp 20   Ht 5' (1.524 m)   Wt 181 lb 12.8 oz (82.5 kg)   SpO2 98%   BMI 35.51 kg/m   Outpatient Encounter Medications as of 05/18/2022  Medication Sig   acetaminophen (TYLENOL) 650 MG CR tablet Take 650 mg by mouth in the morning and  at bedtime.   amLODipine (NORVASC) 10 MG tablet Take 1 tablet (10 mg total) by mouth daily.   aspirin 81 MG chewable tablet Chew 81 mg by mouth daily.   Cranberry 500 MG CAPS Take by mouth in the morning and at bedtime. For UTI prevention   cyanocobalamin 1000 MCG tablet Take 1,000 mcg by mouth daily. 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   diclofenac Sodium (VOLTAREN) 1 % GEL Apply 2 g topically 2 (two) times daily as needed.   DULoxetine (CYMBALTA) 20 MG capsule Take 20 mg by mouth 2 (two) times daily.   lamoTRIgine (LAMICTAL) 25 MG tablet Take 25 mg by mouth daily.   linagliptin (TRADJENTA) 5 MG TABS tablet Take 5 mg by mouth daily.   loratadine (CLARITIN) 10 MG tablet Take 10 mg by mouth daily.   losartan (COZAAR) 25 MG tablet Take 12.5 mg by mouth daily.   Melatonin 5 MG TABS Take 5 mg by mouth at bedtime.   metFORMIN (GLUCOPHAGE) 1000 MG tablet Take 1,000 mg by mouth 2 (two) times daily with a meal.    mirabegron ER (MYRBETRIQ) 50 MG TB24 tablet Take 50 mg by mouth daily.   montelukast (SINGULAIR) 10 MG tablet Take 1 tablet (10 mg total) by mouth at bedtime.   NON FORMULARY Diet: Consistent Carbohydrate   omeprazole (PRILOSEC) 20 MG capsule Take 20 mg by mouth daily. Special Instructions:  *TAKE ON AN EMPTY STOMACH* *DO NOT CRUSH*   polyethylene glycol (MIRALAX / GLYCOLAX) packet Take 17 g by mouth daily.   PRESCRIPTION MEDICATION MICROGUARD POWDER  powder; amt: SPARINGLY; topical Special Instructions: REDNESS UNDER BREAST Once A Day   PRESCRIPTION MEDICATION annusol cream  cream; amt: sparingly; rectal Special Instructions: hemmorhoids Once A Day - PRN   senna-docusate (SENOKOT-S) 8.6-50 MG tablet Take 1 tablet by mouth at bedtime.    sertraline (ZOLOFT) 25 MG tablet Take 25 mg by mouth daily.   simvastatin (ZOCOR) 10 MG tablet Take 10 mg by mouth at bedtime.    triamcinolone  (NASACORT) 55 MCG/ACT AERO nasal inhaler Place 1 spray into the nose daily.   valACYclovir (VALTREX) 500 MG tablet Take 500 mg by mouth daily.    No facility-administered encounter medications on file as of 05/18/2022.     SIGNIFICANT DIAGNOSTIC EXAMS  PREVIOUS  07-16-20 DEXA: t score 0.825   NO NEW EXAMS.   LABS REVIEWED PREVIOUS;  07-23-21: urine culture: e-coli rocephin 08-06-21: glucose 139; bun 20; creat 0.97; k+ 3.8; na++ 137; ca 8.9; GFR 59; mag 1.9  09-30-21: wbc 9.4; hgb 11.2; hct 36.2; mcv 85.4 plt 207; glucose 153; bun 18; creat 0.88; k+ 3.6; na++ 136; ca 8.7; GFR>60 liver normal albumin 3.6; hgb a1c 7.2; chol 124; ldl 50; trig 154; hdl 43; vit B 12: 1086  TODAY  03-14-26: hgb a1c 7.2 04-07-22: wbc 10.9; hgb 11.1; hct 35.0; mcv 82.4 plt 233; glucose 147; bun 18; creat 0.88; k+ 3.6; na++ 139; ca 87; gfr >60; protein 6.5; albumin 3.6 chol 134; ldl 49; trig 169; hdl 51; urine micro-albumin 26.2     Review of Systems  Constitutional:  Negative for malaise/fatigue.  Respiratory:  Negative for cough and shortness of breath.   Cardiovascular:  Negative for chest pain, palpitations and leg swelling.  Gastrointestinal:  Negative for abdominal pain, constipation and heartburn.  Musculoskeletal:  Positive for joint pain. Negative for back pain and myalgias.       Right shoulder   Skin: Negative.   Neurological:  Negative for dizziness.  Psychiatric/Behavioral:  The patient is not nervous/anxious.     Physical Exam Constitutional:      General: She is not in acute distress.    Appearance: She is well-developed. She is not diaphoretic.  Neck:     Thyroid: No thyromegaly.  Cardiovascular:     Rate and Rhythm: Normal rate and regular rhythm.     Pulses: Normal pulses.     Heart sounds: Normal heart sounds.  Pulmonary:     Effort: Pulmonary effort is normal. No respiratory distress.     Breath sounds: Normal breath sounds.  Abdominal:     General: Bowel sounds are normal. There  is no distension.     Palpations: Abdomen is soft.     Tenderness: There is no abdominal tenderness.  Musculoskeletal:        General: Normal range of motion.     Cervical back: Neck supple.     Right lower leg: No edema.     Left lower leg: No edema.  Lymphadenopathy:     Cervical: No cervical adenopathy.  Skin:    General: Skin is warm and dry.  Neurological:     Mental Status: She is alert and oriented to person, place, and time.  Psychiatric:        Mood and Affect: Mood normal.        ASSESSMENT/ PLAN:  TODAY  Herpes: no recent outbreaks: will continue valtrex 500 mg daily   2. Vitamin B12 deficiency: level is 1086 will continue 1,000 mcg daily   3. Chronic constipation: will continue miralax daily; senna s daily  4. Major depression recurrent chronic: will continue cymbalta 20 mg twice daily (failed one wean) lamictal 25 mg twice daily for mood and melatonin 5 mg nightly for sleep   PREVIOUS   5. CKD stage 3 due to type 2 diabetes mellitus: is stable bun 18; creat 0.88; GFR >60  6. Normocytic anemia: is stable hgb 11.2 hct 36.2    7. Hypertension associated with type 2 diabetes mellitus: is stable 145/62 will continue norvasc 10 mg daily asa 81 mg daily cozaar 12.5 mg daily   8. Dyslipidemia associated with type 2 diabetes mellitus: is stable LDL 50 will continue zocor 10 mg daily   9. Aortic atherosclerosis: (ct 12-18-19) on statin   10. Type 2 diabetes mellitus with peripheral angiopathy: hgb a1c 7.2 will continue  metformin 1 gm twice daily tradjenta 5 mg daily is on arb; asa statin  11.Gastroesophageal reflux disease without esophagitis: will continue prilosec 20 mg daily   12. OAB will continue myrbetriq 50 mg daily   13. Chronic generalized pain: will continue tylenol cr 650 mg three times daily voltaren gel 2 gm to both knees twice daily as needed  14. Chronic non-seasonal allergic rhinitis: will continue claritin 10 mg daily singulair 10 mg daily and  nasocort daily    Ok Edwards NP [facility]   call [phone]

## 2022-05-19 DIAGNOSIS — M79631 Pain in right forearm: Secondary | ICD-10-CM | POA: Diagnosis not present

## 2022-05-19 DIAGNOSIS — M79621 Pain in right upper arm: Secondary | ICD-10-CM | POA: Diagnosis not present

## 2022-06-01 ENCOUNTER — Non-Acute Institutional Stay (SKILLED_NURSING_FACILITY): Payer: Medicare Other | Admitting: Adult Health

## 2022-06-01 ENCOUNTER — Encounter: Payer: Self-pay | Admitting: Adult Health

## 2022-06-01 DIAGNOSIS — M25521 Pain in right elbow: Secondary | ICD-10-CM | POA: Diagnosis not present

## 2022-06-01 NOTE — Progress Notes (Signed)
Location:  [facility] Room Number: 115 Place of Service:  SNF (31)   CODE STATUS: full code   Allergies  Allergen Reactions   Lisinopril    Penicillins Nausea Only    Has patient had a PCN reaction causing immediate rash, facial/tongue/throat swelling, SOB or lightheadedness with hypotension: Yes Has patient had a PCN reaction causing severe rash involving mucus membranes or skin necrosis: No Has patient had a PCN reaction that required hospitalization No Has patient had a PCN reaction occurring within the last 10 years: No If all of the above answers are "NO", then may proceed with Cephalosporin use.    Sulfa Antibiotics Nausea And Vomiting    Chief Complaint  Patient presents with   Acute Visit    Right arm pain     HPI:  She tells me that for the past 2 weeks she has had right elbow pain which radiates to her hand. She states that the pain is intermittent in nature. She denies any injury. She stated that the area is tender to touch.   Past Medical History:  Diagnosis Date   Anxiety    Bowel obstruction ([facility])    Burning with urination 01/04/2016   Diabetes mellitus with neuropathy    Genital herpes    GERD (gastroesophageal reflux disease)    Hematuria 01/04/2016   Herpes 12/22/2015   Hyperlipidemia    Hypertension    LLQ abdominal tenderness 01/04/2016   Neuropathy    Pelvic pressure in female 01/04/2016   Recurrent UTI    Sciatic nerve disease    Small bowel obstruction ([facility]) 04/27/2016   Urinary frequency 01/04/2016   Urticaria     Past Surgical History:  Procedure Laterality Date   ABDOMINAL HYSTERECTOMY     APPLICATION OF INTRAOPERATIVE CT SCAN N/A 12/19/2018   Procedure: APPLICATION OF INTRAOPERATIVE CT SCAN;  Surgeon: Maeola Harman, MD;  Location: [facility] OR;  Service: Neurosurgery;  Laterality: N/A;   bowel obstruction     COLON SURGERY     blocked colon   POSTERIOR CERVICAL FUSION/FORAMINOTOMY N/A 12/19/2018   Procedure:  Cervical Seven to Thoracic One Posterior cervical fusion;  Surgeon: Maeola Harman, MD;  Location: [facility] OR;  Service: Neurosurgery;  Laterality: N/A;  Cervical Seven to Thoracic One Posterior cervical fusion    Social History   Socioeconomic History   Marital status: Widowed    Spouse name: Not on file   Number of children: Not on file   Years of education: Not on file   Highest education level: Not on file  Occupational History   Occupation: retired   Tobacco Use   Smoking status: Never   Smokeless tobacco: Current    Types: Snuff  Vaping Use   Vaping Use: Never used  Substance and Sexual Activity   Alcohol use: No   Drug use: No   Sexual activity: Not Currently    Birth control/protection: Surgical    Comment: hyst  Other Topics Concern   Not on file  Social History Narrative   Long term resident of [facility]    Social Determinants of Health   Financial Resource Strain: Not on file  Food Insecurity: Not on file  Transportation Needs: Not on file  Physical Activity: Not on file  Stress: Not on file  Social Connections: Not on file  Intimate Partner Violence: Not on file   Family History  Problem Relation Age of Onset   Diabetes Mother    Heart disease Brother  Hypertension Brother    Diabetes Brother    Hypertension Daughter    Diabetes Brother    Diabetes Brother    Alzheimer's disease Brother    Anxiety disorder Daughter       VITAL SIGNS BP 138/67   Pulse 76   Temp 97.8 F (36.6 C)   Resp 20   Ht 5' (1.524 m)   Wt 181 lb 12.8 oz (82.5 kg)   BMI 35.51 kg/m   Outpatient Encounter Medications as of 06/01/2022  Medication Sig   acetaminophen (TYLENOL) 650 MG CR tablet Take 650 mg by mouth in the morning and at bedtime.   amLODipine (NORVASC) 10 MG tablet Take 1 tablet (10 mg total) by mouth daily.   aspirin 81 MG chewable tablet Chew 81 mg by mouth daily.   Cranberry 500 MG CAPS Take by mouth in the morning and at bedtime. For UTI prevention    cyanocobalamin 1000 MCG tablet Take 1,000 mcg by mouth daily. 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   diclofenac Sodium (VOLTAREN) 1 % GEL Apply 2 g topically 2 (two) times daily as needed.   DULoxetine (CYMBALTA) 20 MG capsule Take 20 mg by mouth 2 (two) times daily.   lamoTRIgine (LAMICTAL) 25 MG tablet Take 25 mg by mouth daily.   linagliptin (TRADJENTA) 5 MG TABS tablet Take 5 mg by mouth daily.   loratadine (CLARITIN) 10 MG tablet Take 10 mg by mouth daily.   losartan (COZAAR) 25 MG tablet Take 12.5 mg by mouth daily.   Melatonin 5 MG TABS Take 5 mg by mouth at bedtime.   metFORMIN (GLUCOPHAGE) 1000 MG tablet Take 1,000 mg by mouth 2 (two) times daily with a meal.    mirabegron ER (MYRBETRIQ) 50 MG TB24 tablet Take 50 mg by mouth daily.   montelukast (SINGULAIR) 10 MG tablet Take 1 tablet (10 mg total) by mouth at bedtime.   NON FORMULARY Diet: Consistent Carbohydrate   omeprazole (PRILOSEC) 20 MG capsule Take 20 mg by mouth daily. Special Instructions:  *TAKE ON AN EMPTY STOMACH* *DO NOT CRUSH*   polyethylene glycol (MIRALAX / GLYCOLAX) packet Take 17 g by mouth daily.   PRESCRIPTION MEDICATION MICROGUARD POWDER  powder; amt: SPARINGLY; topical Special Instructions: REDNESS UNDER BREAST Once A Day   PRESCRIPTION MEDICATION annusol cream  cream; amt: sparingly; rectal Special Instructions: hemmorhoids Once A Day - PRN   senna-docusate (SENOKOT-S) 8.6-50 MG tablet Take 1 tablet by mouth at bedtime.    sertraline (ZOLOFT) 25 MG tablet Take 25 mg by mouth daily.   simvastatin (ZOCOR) 10 MG tablet Take 10 mg by mouth at bedtime.    triamcinolone (NASACORT) 55 MCG/ACT AERO nasal inhaler Place 1 spray into the nose daily.   valACYclovir (VALTREX) 500 MG tablet Take 500 mg by mouth daily.    No facility-administered encounter medications on file as of 06/01/2022.     SIGNIFICANT  DIAGNOSTIC EXAMS   PREVIOUS  07-16-20 DEXA: t score 0.825   NO NEW EXAMS.   LABS REVIEWED PREVIOUS;    07-23-21: urine culture: e-coli rocephin 08-06-21: glucose 139; bun 20; creat 0.97; k+ 3.8; na++ 137; ca 8.9; GFR 59; mag 1.9  09-30-21: wbc 9.4; hgb 11.2; hct 36.2; mcv 85.4 plt 207; glucose 153; bun 18; creat 0.88; k+ 3.6; na++ 136; ca 8.7; GFR>60 liver normal albumin 3.6; hgb a1c 7.2; chol 124; ldl 50; trig 154;  hdl 43; vit B 12: 1086 03-14-26: hgb a1c 7.2 04-07-22: wbc 10.9; hgb 11.1; hct 35.0; mcv 82.4 plt 233; glucose 147; bun 18; creat 0.88; k+ 3.6; na++ 139; ca 87; gfr >60; protein 6.5; albumin 3.6 chol 134; ldl 49; trig 169; hdl 51; urine micro-albumin 26.2     NO NEW LABS.   Review of Systems  Constitutional:  Negative for malaise/fatigue.  Respiratory:  Negative for cough and shortness of breath.   Cardiovascular:  Negative for chest pain, palpitations and leg swelling.  Gastrointestinal:  Negative for abdominal pain, constipation and heartburn.  Musculoskeletal:  Positive for myalgias. Negative for back pain and joint pain.  Skin: Negative.   Neurological:  Negative for dizziness.  Psychiatric/Behavioral:  The patient is not nervous/anxious.    Physical Exam Constitutional:      General: She is not in acute distress.    Appearance: She is well-developed. She is not diaphoretic.  Neck:     Thyroid: No thyromegaly.  Cardiovascular:     Rate and Rhythm: Normal rate and regular rhythm.     Pulses: Normal pulses.     Heart sounds: Normal heart sounds.  Pulmonary:     Effort: Pulmonary effort is normal. No respiratory distress.     Breath sounds: Normal breath sounds.  Abdominal:     General: Bowel sounds are normal. There is no distension.     Palpations: Abdomen is soft.     Tenderness: There is no abdominal tenderness.  Musculoskeletal:        General: Normal range of motion.     Cervical back: Neck supple.     Right lower leg: No edema.     Left lower leg: No  edema.     Comments: Right arm near elbow has a muscle spasm present. The area is tender to touch.   Lymphadenopathy:     Cervical: No cervical adenopathy.  Skin:    General: Skin is warm and dry.  Neurological:     Mental Status: She is alert and oriented to person, place, and time.  Psychiatric:        Mood and Affect: Mood normal.       ASSESSMENT/ PLAN:  TODAY  Right elbow pain: will have ot evaluate and treat for spasm and will have robaxin 500 mg every 8 hours as needed will monitor    Synthia Innocent NP [facility]   call [phone]

## 2022-06-06 ENCOUNTER — Encounter: Payer: Self-pay | Admitting: Adult Health

## 2022-06-06 ENCOUNTER — Non-Acute Institutional Stay (SKILLED_NURSING_FACILITY): Payer: Medicare Other | Admitting: Adult Health

## 2022-06-06 DIAGNOSIS — G8929 Other chronic pain: Secondary | ICD-10-CM | POA: Diagnosis not present

## 2022-06-06 DIAGNOSIS — R52 Pain, unspecified: Secondary | ICD-10-CM

## 2022-06-06 NOTE — Progress Notes (Signed)
Location:  [place] Room Number: 115/W Place of Service:  SNF (31)   CODE STATUS: Full Code  Allergies  Allergen Reactions   Lisinopril    Penicillins Nausea Only    Has patient had a PCN reaction causing immediate rash, facial/tongue/throat swelling, SOB or lightheadedness with hypotension: Yes Has patient had a PCN reaction causing severe rash involving mucus membranes or skin necrosis: No Has patient had a PCN reaction that required hospitalization No Has patient had a PCN reaction occurring within the last 10 years: No If all of the above answers are "NO", then may proceed with Cephalosporin use.    Sulfa Antibiotics Nausea And Vomiting    Chief Complaint  Patient presents with   Acute Visit    Pain management     HPI:  She is having pain management issues. She is presently taking on tylenol 650 mg cr twice daily cymbalta 20 mg twice daily; robaxin 500 mg as needed. She is complaining of back; left hip pain; shoulder pain. She is wanting to have her pain better managed. Her pain is interfering with the quality of her life.   Past Medical History:  Diagnosis Date   Anxiety    Bowel obstruction ([place])    Burning with urination 01/04/2016   Diabetes mellitus with neuropathy    Genital herpes    GERD (gastroesophageal reflux disease)    Hematuria 01/04/2016   Herpes 12/22/2015   Hyperlipidemia    Hypertension    LLQ abdominal tenderness 01/04/2016   Neuropathy    Pelvic pressure in female 01/04/2016   Recurrent UTI    Sciatic nerve disease    Small bowel obstruction ([place]) 04/27/2016   Urinary frequency 01/04/2016   Urticaria     Past Surgical History:  Procedure Laterality Date   ABDOMINAL HYSTERECTOMY     APPLICATION OF INTRAOPERATIVE CT SCAN N/A 12/19/2018   Procedure: APPLICATION OF INTRAOPERATIVE CT SCAN;  Surgeon: Erline Levine, MD;  Location: [place];  Service: Neurosurgery;  Laterality: N/A;   bowel obstruction     COLON SURGERY      blocked colon   POSTERIOR CERVICAL FUSION/FORAMINOTOMY N/A 12/19/2018   Procedure: Cervical Seven to Thoracic One Posterior cervical fusion;  Surgeon: Erline Levine, MD;  Location: [place];  Service: Neurosurgery;  Laterality: N/A;  Cervical Seven to Thoracic One Posterior cervical fusion    Social History   Socioeconomic History   Marital status: Widowed    Spouse name: Not on file   Number of children: Not on file   Years of education: Not on file   Highest education level: Not on file  Occupational History   Occupation: retired   Tobacco Use   Smoking status: Never   Smokeless tobacco: Current    Types: Snuff  Vaping Use   Vaping Use: Never used  Substance and Sexual Activity   Alcohol use: No   Drug use: No   Sexual activity: Not Currently    Birth control/protection: Surgical    Comment: hyst  Other Topics Concern   Not on file  Social History Narrative   Long term resident of [facility]    Social Determinants of Health   Financial Resource Strain: Not on file  Food Insecurity: Not on file  Transportation Needs: Not on file  Physical Activity: Not on file  Stress: Not on file  Social Connections: Not on file  Intimate Partner Violence: Not on file   Family History  Problem Relation Age of  Onset   Diabetes Mother    Heart disease Brother    Hypertension Brother    Diabetes Brother    Hypertension Daughter    Diabetes Brother    Diabetes Brother    Alzheimer's disease Brother    Anxiety disorder Daughter       VITAL SIGNS BP (!) 158/82   Pulse (!) 102   Temp 98.1 F (36.7 C)   Resp (!) 22   Ht 5' (1.524 m)   Wt 181 lb 12.8 oz (82.5 kg)   SpO2 92%   BMI 35.51 kg/m   Outpatient Encounter Medications as of 06/06/2022  Medication Sig   acetaminophen (TYLENOL) 650 MG CR tablet Take 650 mg by mouth in the morning and at bedtime.   amLODipine (NORVASC) 10 MG tablet Take 1 tablet (10 mg total) by mouth daily.   aspirin 81 MG chewable tablet Chew 81 mg by  mouth daily.   Cranberry 500 MG CAPS Take by mouth in the morning and at bedtime. For UTI prevention   cyanocobalamin 1000 MCG tablet Take 1,000 mcg by mouth daily. 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   DULoxetine (CYMBALTA) 20 MG capsule Take 20 mg by mouth 2 (two) times daily.   lamoTRIgine (LAMICTAL) 25 MG tablet Take 25 mg by mouth daily.   linagliptin (TRADJENTA) 5 MG TABS tablet Take 5 mg by mouth daily.   loratadine (CLARITIN) 10 MG tablet Take 10 mg by mouth daily.   losartan (COZAAR) 25 MG tablet Take 12.5 mg by mouth daily.   Melatonin 5 MG TABS Take 5 mg by mouth at bedtime.   metFORMIN (GLUCOPHAGE) 1000 MG tablet Take 1,000 mg by mouth 2 (two) times daily with a meal.    methocarbamol (ROBAXIN) 500 MG tablet Take 500 mg by mouth every 8 (eight) hours as needed (For right arm pain).   mirabegron ER (MYRBETRIQ) 50 MG TB24 tablet Take 50 mg by mouth daily.   montelukast (SINGULAIR) 10 MG tablet Take 1 tablet (10 mg total) by mouth at bedtime.   NON FORMULARY Diet: Consistent Carbohydrate   omeprazole (PRILOSEC) 20 MG capsule Take 20 mg by mouth daily. Special Instructions:  *TAKE ON AN EMPTY STOMACH* *DO NOT CRUSH*   polyethylene glycol (MIRALAX / GLYCOLAX) packet Take 17 g by mouth daily.   PRESCRIPTION MEDICATION MICROGUARD POWDER  powder; amt: SPARINGLY; topical Special Instructions: REDNESS UNDER BREAST Once A Day   PRESCRIPTION MEDICATION annusol cream  cream; amt: sparingly; rectal Special Instructions: hemmorhoids Once A Day - PRN   senna-docusate (SENOKOT-S) 8.6-50 MG tablet Take 1 tablet by mouth at bedtime.    sertraline (ZOLOFT) 25 MG tablet Take 25 mg by mouth daily.   simvastatin (ZOCOR) 10 MG tablet Take 10 mg by mouth at bedtime.    triamcinolone (NASACORT) 55 MCG/ACT AERO nasal inhaler Place 1 spray into the nose daily.   valACYclovir (VALTREX) 500 MG tablet  Take 500 mg by mouth daily.    [DISCONTINUED] diclofenac Sodium (VOLTAREN) 1 % GEL Apply 2 g topically 2 (two) times daily as needed.   No facility-administered encounter medications on file as of 06/06/2022.     SIGNIFICANT DIAGNOSTIC EXAMS   PREVIOUS  07-16-20 DEXA: t score 0.825   NO NEW EXAMS.   LABS REVIEWED PREVIOUS;    07-23-21: urine culture: e-coli rocephin 08-06-21: glucose 139; bun 20; creat 0.97; k+ 3.8; na++ 137;  ca 8.9; GFR 59; mag 1.9  09-30-21: wbc 9.4; hgb 11.2; hct 36.2; mcv 85.4 plt 207; glucose 153; bun 18; creat 0.88; k+ 3.6; na++ 136; ca 8.7; GFR>60 liver normal albumin 3.6; hgb a1c 7.2; chol 124; ldl 50; trig 154; hdl 43; vit B 12: 1086 03-14-26: hgb a1c 7.2 04-07-22: wbc 10.9; hgb 11.1; hct 35.0; mcv 82.4 plt 233; glucose 147; bun 18; creat 0.88; k+ 3.6; na++ 139; ca 87; gfr >60; protein 6.5; albumin 3.6 chol 134; ldl 49; trig 169; hdl 51; urine micro-albumin 26.2     NO NEW LABS.   Review of Systems  Constitutional:  Negative for malaise/fatigue.  Respiratory:  Negative for cough and shortness of breath.   Cardiovascular:  Negative for chest pain, palpitations and leg swelling.  Gastrointestinal:  Negative for abdominal pain, constipation and heartburn.  Musculoskeletal:  Positive for back pain, joint pain and myalgias.  Skin: Negative.   Neurological:  Negative for dizziness.  Psychiatric/Behavioral:  The patient is not nervous/anxious.     Physical Exam Constitutional:      General: She is not in acute distress.    Appearance: She is well-developed. She is not diaphoretic.  Neck:     Thyroid: No thyromegaly.  Cardiovascular:     Rate and Rhythm: Normal rate and regular rhythm.     Pulses: Normal pulses.     Heart sounds: Normal heart sounds.  Pulmonary:     Effort: Pulmonary effort is normal. No respiratory distress.     Breath sounds: Normal breath sounds.  Abdominal:     General: Bowel sounds are normal. There is no distension.     Palpations:  Abdomen is soft.     Tenderness: There is no abdominal tenderness.  Musculoskeletal:        General: Normal range of motion.     Cervical back: Neck supple.     Right lower leg: No edema.     Left lower leg: No edema.  Lymphadenopathy:     Cervical: No cervical adenopathy.  Skin:    General: Skin is warm and dry.  Neurological:     Mental Status: She is alert and oriented to person, place, and time.  Psychiatric:        Mood and Affect: Mood normal.       ASSESSMENT/ PLAN:  TODAY  Chronic generalized pain: her pain is not being managed. Will stop volatern gel as this is not effective; will change her cymbalta to 60 mg daily and will begin mobic 15 mg daily    Synthia Innocent NP [facility]  call [phone]

## 2022-06-09 ENCOUNTER — Encounter (HOSPITAL_COMMUNITY): Payer: Self-pay | Admitting: *Deleted

## 2022-06-09 ENCOUNTER — Other Ambulatory Visit: Payer: Self-pay

## 2022-06-09 ENCOUNTER — Emergency Department (HOSPITAL_COMMUNITY)
Admission: EM | Admit: 2022-06-09 | Discharge: 2022-06-09 | Discharge status: Against Medical Advice | Payer: Medicare Other | Attending: Emergency Medicine | Admitting: Emergency Medicine

## 2022-06-09 DIAGNOSIS — Z5321 Procedure and treatment not carried out due to patient leaving prior to being seen by health care provider: Secondary | ICD-10-CM | POA: Insufficient documentation

## 2022-06-09 DIAGNOSIS — M79605 Pain in left leg: Secondary | ICD-10-CM | POA: Diagnosis not present

## 2022-06-09 NOTE — ED Notes (Signed)
Family member decided to leave with pt due to long wait time.

## 2022-06-09 NOTE — ED Triage Notes (Addendum)
Pt with pain to left leg x 5 days.  Pt is from the [facility].   Pt states hx of same but pt states it's not been this bad and did not have it checked out.

## 2022-06-10 ENCOUNTER — Encounter: Payer: Self-pay | Admitting: Adult Health

## 2022-06-10 ENCOUNTER — Non-Acute Institutional Stay (SKILLED_NURSING_FACILITY): Payer: Medicare Other | Admitting: Adult Health

## 2022-06-10 DIAGNOSIS — M5432 Sciatica, left side: Secondary | ICD-10-CM

## 2022-06-10 NOTE — Progress Notes (Unsigned)
Location:  [facility] Room Number: 115-W Place of Service:  SNF (31)   CODE STATUS: Full Code  Allergies  Allergen Reactions   Lisinopril    Penicillins Nausea Only    Has patient had a PCN reaction causing immediate rash, facial/tongue/throat swelling, SOB or lightheadedness with hypotension: Yes Has patient had a PCN reaction causing severe rash involving mucus membranes or skin necrosis: No Has patient had a PCN reaction that required hospitalization No Has patient had a PCN reaction occurring within the last 10 years: No If all of the above answers are "NO", then may proceed with Cephalosporin use.    Sulfa Antibiotics Nausea And Vomiting    Chief Complaint  Patient presents with   Acute Visit    Pain management     HPI:  She continues to have left sciatic pain. The pain radiates from left hip to lower leg. The pain is not well controlled. She is presently taking cymbalta tylenol and mobic at this time. Her family is questioning the need for an mri. At this time an mri is not needed. Will need to try a prednisone taper.   Past Medical History:  Diagnosis Date   Anxiety    Bowel obstruction ([facility])    Burning with urination 01/04/2016   Diabetes mellitus with neuropathy    Genital herpes    GERD (gastroesophageal reflux disease)    Hematuria 01/04/2016   Herpes 12/22/2015   Hyperlipidemia    Hypertension    LLQ abdominal tenderness 01/04/2016   Neuropathy    Pelvic pressure in female 01/04/2016   Recurrent UTI    Sciatic nerve disease    Small bowel obstruction ([facility]) 04/27/2016   Urinary frequency 01/04/2016   Urticaria     Past Surgical History:  Procedure Laterality Date   ABDOMINAL HYSTERECTOMY     APPLICATION OF INTRAOPERATIVE CT SCAN N/A 12/19/2018   Procedure: APPLICATION OF INTRAOPERATIVE CT SCAN;  Surgeon: Maeola Harman, MD;  Location: [facility] OR;  Service: Neurosurgery;  Laterality: N/A;   bowel obstruction     COLON SURGERY      blocked colon   POSTERIOR CERVICAL FUSION/FORAMINOTOMY N/A 12/19/2018   Procedure: Cervical Seven to Thoracic One Posterior cervical fusion;  Surgeon: Maeola Harman, MD;  Location: [facility] OR;  Service: Neurosurgery;  Laterality: N/A;  Cervical Seven to Thoracic One Posterior cervical fusion    Social History   Socioeconomic History   Marital status: Widowed    Spouse name: Not on file   Number of children: Not on file   Years of education: Not on file   Highest education level: Not on file  Occupational History   Occupation: retired   Tobacco Use   Smoking status: Never   Smokeless tobacco: Current    Types: Snuff  Vaping Use   Vaping Use: Never used  Substance and Sexual Activity   Alcohol use: No   Drug use: No   Sexual activity: Not Currently    Birth control/protection: Surgical    Comment: hyst  Other Topics Concern   Not on file  Social History Narrative   [facility] term resident of [facility]    Social Determinants of Health   Financial Resource Strain: Not on file  Food Insecurity: Not on file  Transportation Needs: Not on file  Physical Activity: Not on file  Stress: Not on file  Social Connections: Not on file  Intimate Partner Violence: Not on file   Family History  Problem Relation Age  of Onset   Diabetes Mother    Heart disease Brother    Hypertension Brother    Diabetes Brother    Hypertension Daughter    Diabetes Brother    Diabetes Brother    Alzheimer's disease Brother    Anxiety disorder Daughter       VITAL SIGNS BP (!) 158/82   Pulse (!) 102   Temp 98.1 F (36.7 C)   Resp (!) 22   Ht 5' (1.524 m)   Wt 181 lb 12.8 oz (82.5 kg)   SpO2 92%   BMI 35.51 kg/m   Outpatient Encounter Medications as of 06/10/2022  Medication Sig   acetaminophen (TYLENOL) 650 MG CR tablet Take 650 mg by mouth in the morning and at bedtime.   amLODipine (NORVASC) 10 MG tablet Take 1 tablet (10 mg total) by mouth daily.   aspirin 81 MG chewable tablet Chew 81 mg by mouth  daily.   Cranberry 500 MG CAPS Take by mouth in the morning and at bedtime. For UTI prevention   cyanocobalamin 1000 MCG tablet Take 1,000 mcg by mouth daily. 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   DULoxetine (CYMBALTA) 60 MG capsule Take 60 mg by mouth daily. Pain management   lamoTRIgine (LAMICTAL) 25 MG tablet Take 25 mg by mouth daily.   linagliptin (TRADJENTA) 5 MG TABS tablet Take 5 mg by mouth daily.   loratadine (CLARITIN) 10 MG tablet Take 10 mg by mouth daily.   losartan (COZAAR) 25 MG tablet Take 12.5 mg by mouth daily.   Melatonin 5 MG TABS Take 5 mg by mouth at bedtime.   meloxicam (MOBIC) 15 MG tablet Take 15 mg by mouth daily. cord compression   metFORMIN (GLUCOPHAGE) 1000 MG tablet Take 1,000 mg by mouth 2 (two) times daily with a meal.    methocarbamol (ROBAXIN) 500 MG tablet Take 500 mg by mouth every 8 (eight) hours as needed (back/hip muscle spasms).   mirabegron ER (MYRBETRIQ) 50 MG TB24 tablet Take 50 mg by mouth daily.   montelukast (SINGULAIR) 10 MG tablet Take 1 tablet (10 mg total) by mouth at bedtime.   NON FORMULARY Diet: Consistent Carbohydrate   omeprazole (PRILOSEC) 20 MG capsule Take 20 mg by mouth daily. Special Instructions:  *TAKE ON AN EMPTY STOMACH* *DO NOT CRUSH*   polyethylene glycol (MIRALAX / GLYCOLAX) packet Take 17 g by mouth daily.   predniSONE (DELTASONE) 20 MG tablet Take 40 mg by mouth daily with breakfast. Special Instructions: for sciatic pain   PRESCRIPTION MEDICATION MICROGUARD POWDER  powder; amt: SPARINGLY; topical Special Instructions: REDNESS UNDER BREAST Once A Day   PRESCRIPTION MEDICATION annusol cream  cream; amt: sparingly; rectal Special Instructions: hemmorhoids Once A Day - PRN   senna-docusate (SENOKOT-S) 8.6-50 MG tablet Take 1 tablet by mouth at bedtime.    sertraline (ZOLOFT) 25 MG tablet Take 25 mg by mouth daily.    simvastatin (ZOCOR) 10 MG tablet Take 10 mg by mouth at bedtime.    triamcinolone (NASACORT) 55 MCG/ACT AERO nasal inhaler Place 1 spray into the nose daily.   valACYclovir (VALTREX) 500 MG tablet Take 500 mg by mouth daily.    [DISCONTINUED] DULoxetine (CYMBALTA) 20 MG capsule Take 20 mg by mouth 2 (two) times daily.   No facility-administered encounter medications on file as of 06/10/2022.     SIGNIFICANT DIAGNOSTIC EXAMS   PREVIOUS  07-16-20 DEXA:  t score 0.825   NO NEW EXAMS.   LABS REVIEWED PREVIOUS;    07-23-21: urine culture: e-coli rocephin 08-06-21: glucose 139; bun 20; creat 0.97; k+ 3.8; na++ 137; ca 8.9; GFR 59; mag 1.9  09-30-21: wbc 9.4; hgb 11.2; hct 36.2; mcv 85.4 plt 207; glucose 153; bun 18; creat 0.88; k+ 3.6; na++ 136; ca 8.7; GFR>60 liver normal albumin 3.6; hgb a1c 7.2; chol 124; ldl 50; trig 154; hdl 43; vit B 12: 1086 03-14-26: hgb a1c 7.2 04-07-22: wbc 10.9; hgb 11.1; hct 35.0; mcv 82.4 plt 233; glucose 147; bun 18; creat 0.88; k+ 3.6; na++ 139; ca 87; gfr >60; protein 6.5; albumin 3.6 chol 134; ldl 49; trig 169; hdl 51; urine micro-albumin 26.2     NO NEW LABS.   Review of Systems  Constitutional:  Negative for malaise/fatigue.  Respiratory:  Negative for cough and shortness of breath.   Cardiovascular:  Negative for chest pain, palpitations and leg swelling.  Gastrointestinal:  Negative for abdominal pain, constipation and heartburn.  Musculoskeletal:  Positive for back pain and joint pain. Negative for falls and myalgias.  Skin: Negative.   Neurological:  Negative for dizziness.  Psychiatric/Behavioral:  The patient is not nervous/anxious.     Physical Exam Constitutional:      General: She is not in acute distress.    Appearance: She is well-developed. She is not diaphoretic.  Neck:     Thyroid: No thyromegaly.  Cardiovascular:     Rate and Rhythm: Normal rate and regular rhythm.     Pulses: Normal pulses.     Heart sounds: Normal heart sounds.   Pulmonary:     Effort: Pulmonary effort is normal. No respiratory distress.     Breath sounds: Normal breath sounds.  Abdominal:     General: Bowel sounds are normal. There is no distension.     Palpations: Abdomen is soft.     Tenderness: There is no abdominal tenderness.  Musculoskeletal:        General: Normal range of motion.     Cervical back: Neck supple.     Right lower leg: No edema.     Left lower leg: No edema.     Comments: No spinal cord tenderness present. Does have tenderness posterior left hip   Lymphadenopathy:     Cervical: No cervical adenopathy.  Skin:    General: Skin is warm and dry.  Neurological:     Mental Status: She is alert and oriented to person, place, and time.  Psychiatric:        Mood and Affect: Mood normal.       ASSESSMENT/ PLAN:  TODAY  Left sciatic pain: will change robaxin to 500 mg every 8 hours through 06-17-22. Will give one dose oxycodone 5 mg; will get lumbar/sacral and left hip x-ray; will begin prednisone taper: 40 mg through 06-12-22 then 20 mg daily through 06-16-22; 10 mg daily through 06-20-22. Will monitor her status    Synthia Innocent NP [facility]  call [phone]

## 2022-06-13 ENCOUNTER — Other Ambulatory Visit: Payer: Self-pay | Admitting: Adult Health

## 2022-06-13 DIAGNOSIS — M5432 Sciatica, left side: Secondary | ICD-10-CM | POA: Insufficient documentation

## 2022-06-13 DIAGNOSIS — M545 Low back pain, unspecified: Secondary | ICD-10-CM | POA: Diagnosis not present

## 2022-06-13 DIAGNOSIS — M1612 Unilateral primary osteoarthritis, left hip: Secondary | ICD-10-CM | POA: Diagnosis not present

## 2022-06-13 DIAGNOSIS — M25552 Pain in left hip: Secondary | ICD-10-CM | POA: Diagnosis not present

## 2022-06-13 MED ORDER — PREGABALIN 50 MG PO CAPS: 50.0000 mg | ORAL_CAPSULE | Freq: Three times a day (TID) | ORAL | 0 refills | Status: DC | PRN

## 2022-06-14 DIAGNOSIS — G8929 Other chronic pain: Secondary | ICD-10-CM | POA: Diagnosis not present

## 2022-06-14 DIAGNOSIS — M6281 Muscle weakness (generalized): Secondary | ICD-10-CM | POA: Diagnosis not present

## 2022-06-14 DIAGNOSIS — M25511 Pain in right shoulder: Secondary | ICD-10-CM | POA: Diagnosis not present

## 2022-06-14 DIAGNOSIS — R278 Other lack of coordination: Secondary | ICD-10-CM | POA: Diagnosis not present

## 2022-06-14 DIAGNOSIS — Z741 Need for assistance with personal care: Secondary | ICD-10-CM | POA: Diagnosis not present

## 2022-06-14 DIAGNOSIS — M25512 Pain in left shoulder: Secondary | ICD-10-CM | POA: Diagnosis not present

## 2022-06-20 DIAGNOSIS — B351 Tinea unguium: Secondary | ICD-10-CM | POA: Diagnosis not present

## 2022-06-20 DIAGNOSIS — Z794 Long term (current) use of insulin: Secondary | ICD-10-CM | POA: Diagnosis not present

## 2022-06-20 DIAGNOSIS — E1140 Type 2 diabetes mellitus with diabetic neuropathy, unspecified: Secondary | ICD-10-CM | POA: Diagnosis not present

## 2022-06-21 ENCOUNTER — Non-Acute Institutional Stay (SKILLED_NURSING_FACILITY): Payer: Medicare Other | Admitting: Adult Health

## 2022-06-21 ENCOUNTER — Encounter: Payer: Self-pay | Admitting: Adult Health

## 2022-06-21 DIAGNOSIS — D649 Anemia, unspecified: Secondary | ICD-10-CM | POA: Diagnosis not present

## 2022-06-21 DIAGNOSIS — E1169 Type 2 diabetes mellitus with other specified complication: Secondary | ICD-10-CM

## 2022-06-21 DIAGNOSIS — I152 Hypertension secondary to endocrine disorders: Secondary | ICD-10-CM | POA: Diagnosis not present

## 2022-06-21 DIAGNOSIS — E1159 Type 2 diabetes mellitus with other circulatory complications: Secondary | ICD-10-CM

## 2022-06-21 DIAGNOSIS — E1122 Type 2 diabetes mellitus with diabetic chronic kidney disease: Secondary | ICD-10-CM

## 2022-06-21 DIAGNOSIS — E785 Hyperlipidemia, unspecified: Secondary | ICD-10-CM | POA: Diagnosis not present

## 2022-06-21 DIAGNOSIS — N183 Chronic kidney disease, stage 3 unspecified: Secondary | ICD-10-CM | POA: Diagnosis not present

## 2022-06-21 NOTE — Progress Notes (Signed)
Location:  [facility] Room Number: 115-W Place of Service:  SNF (31)   CODE STATUS: Full Code  Allergies  Allergen Reactions   Lisinopril    Penicillins Nausea Only    Has patient had a PCN reaction causing immediate rash, facial/tongue/throat swelling, SOB or lightheadedness with hypotension: Yes Has patient had a PCN reaction causing severe rash involving mucus membranes or skin necrosis: No Has patient had a PCN reaction that required hospitalization No Has patient had a PCN reaction occurring within the last 10 years: No If all of the above answers are "NO", then may proceed with Cephalosporin use.    Sulfa Antibiotics Nausea And Vomiting    Chief Complaint  Patient presents with   Medical Management of Chronic Issues                              CKD stage 3 due to type 2 diabetes mellitus:  Normocytic anemia:. Hypertension associated with type 2 diabetes mellitus:  Dyslipidemia associated with type 2 diabetes mellitus:    HPI:  She is a 81 year old long term resident of this facility being seen for the management of her chronic illnesses: CKD stage 3 due to type 2 diabetes mellitus:  Normocytic anemia:. Hypertension associated with type 2 diabetes mellitus:  Dyslipidemia associated with type 2 diabetes mellitus. She has completed her prednisone taper. She does not have any back or hip pain. She does have som bilateral hand pain. Does does go on leave with her family.   Past Medical History:  Diagnosis Date   Anxiety    Bowel obstruction (HCC)    Burning with urination 01/04/2016   Diabetes mellitus with neuropathy    Genital herpes    GERD (gastroesophageal reflux disease)    Hematuria 01/04/2016   Herpes 12/22/2015   Hyperlipidemia    Hypertension    LLQ abdominal tenderness 01/04/2016   Neuropathy    Pelvic pressure in female 01/04/2016   Recurrent UTI    Sciatic nerve disease    Small bowel obstruction (HCC) 04/27/2016   Urinary  frequency 01/04/2016   Urticaria     Past Surgical History:  Procedure Laterality Date   ABDOMINAL HYSTERECTOMY     APPLICATION OF INTRAOPERATIVE CT SCAN N/A 12/19/2018   Procedure: APPLICATION OF INTRAOPERATIVE CT SCAN;  Surgeon: Maeola Harman, MD;  Location: [facility] OR;  Service: Neurosurgery;  Laterality: N/A;   bowel obstruction     COLON SURGERY     blocked colon   POSTERIOR CERVICAL FUSION/FORAMINOTOMY N/A 12/19/2018   Procedure: Cervical Seven to Thoracic One Posterior cervical fusion;  Surgeon: Maeola Harman, MD;  Location: [facility] OR;  Service: Neurosurgery;  Laterality: N/A;  Cervical Seven to Thoracic One Posterior cervical fusion    Social History   Socioeconomic History   Marital status: Widowed    Spouse name: Not on file   Number of children: Not on file   Years of education: Not on file   Highest education level: Not on file  Occupational History   Occupation: retired   Tobacco Use   Smoking status: Never   Smokeless tobacco: Current    Types: Snuff  Vaping Use   Vaping Use: Never used  Substance and Sexual Activity   Alcohol use: No   Drug use: No   Sexual activity: Not Currently    Birth control/protection: Surgical    Comment: hyst  Other Topics Concern  Not on file  Social History Narrative   Long term resident of [facility]    Social Determinants of Health   Financial Resource Strain: Not on file  Food Insecurity: Not on file  Transportation Needs: Not on file  Physical Activity: Not on file  Stress: Not on file  Social Connections: Not on file  Intimate Partner Violence: Not on file   Family History  Problem Relation Age of Onset   Diabetes Mother    Heart disease Brother    Hypertension Brother    Diabetes Brother    Hypertension Daughter    Diabetes Brother    Diabetes Brother    Alzheimer's disease Brother    Anxiety disorder Daughter       VITAL SIGNS BP 134/74   Pulse 74   Temp 97.6 F (36.4 C)   Resp (!) 22   Ht 5' (1.524 m)   Wt 181  lb (82.1 kg)   SpO2 97%   BMI 35.35 kg/m   Outpatient Encounter Medications as of 06/21/2022  Medication Sig   acetaminophen (TYLENOL) 650 MG CR tablet Take 650 mg by mouth in the morning and at bedtime.   amLODipine (NORVASC) 10 MG tablet Take 1 tablet (10 mg total) by mouth daily.   aspirin 81 MG chewable tablet Chew 81 mg by mouth daily.   Cranberry 500 MG CAPS Take by mouth in the morning and at bedtime. For UTI prevention   cyanocobalamin 1000 MCG tablet Take 1,000 mcg by mouth daily. 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   DULoxetine (CYMBALTA) 60 MG capsule Take 60 mg by mouth daily. Pain management   lamoTRIgine (LAMICTAL) 25 MG tablet Take 25 mg by mouth daily.   linagliptin (TRADJENTA) 5 MG TABS tablet Take 5 mg by mouth daily.   loratadine (CLARITIN) 10 MG tablet Take 10 mg by mouth daily.   losartan (COZAAR) 25 MG tablet Take 12.5 mg by mouth daily.   Melatonin 5 MG TABS Take 5 mg by mouth at bedtime.   meloxicam (MOBIC) 15 MG tablet Take 15 mg by mouth daily. cord compression   metFORMIN (GLUCOPHAGE) 1000 MG tablet Take 1,000 mg by mouth 2 (two) times daily with a meal.    mirabegron ER (MYRBETRIQ) 50 MG TB24 tablet Take 50 mg by mouth daily.   montelukast (SINGULAIR) 10 MG tablet Take 1 tablet (10 mg total) by mouth at bedtime.   NON FORMULARY Diet: Consistent Carbohydrate   omeprazole (PRILOSEC) 20 MG capsule Take 20 mg by mouth daily. Special Instructions:  *TAKE ON AN EMPTY STOMACH* *DO NOT CRUSH*   polyethylene glycol (MIRALAX / GLYCOLAX) packet Take 17 g by mouth daily.   PRESCRIPTION MEDICATION MICROGUARD POWDER  powder; amt: SPARINGLY; topical Special Instructions: REDNESS UNDER BREAST Once A Day   PRESCRIPTION MEDICATION annusol cream  cream; amt: sparingly; rectal Special Instructions: hemmorhoids Once A Day - PRN   senna-docusate (SENOKOT-S) 8.6-50 MG tablet  Take 1 tablet by mouth at bedtime.    sertraline (ZOLOFT) 25 MG tablet Take 25 mg by mouth daily.   simvastatin (ZOCOR) 10 MG tablet Take 10 mg by mouth at bedtime.    triamcinolone (NASACORT) 55 MCG/ACT AERO nasal inhaler Place 1 spray into the nose daily.   valACYclovir (VALTREX) 500 MG tablet Take 500 mg by mouth daily.    [DISCONTINUED] methocarbamol (ROBAXIN) 500 MG tablet Take 500 mg by mouth  every 8 (eight) hours as needed (back/hip muscle spasms).   [DISCONTINUED] predniSONE (DELTASONE) 20 MG tablet Take 40 mg by mouth daily with breakfast. Special Instructions: for sciatic pain   [DISCONTINUED] pregabalin (LYRICA) 50 MG capsule Take 1 capsule (50 mg total) by mouth 3 (three) times daily as needed.   No facility-administered encounter medications on file as of 06/21/2022.     SIGNIFICANT DIAGNOSTIC EXAMS   PREVIOUS  07-16-20 DEXA: t score 0.825   NO NEW EXAMS.   LABS REVIEWED PREVIOUS;    07-23-21: urine culture: e-coli rocephin 08-06-21: glucose 139; bun 20; creat 0.97; k+ 3.8; na++ 137; ca 8.9; GFR 59; mag 1.9  09-30-21: wbc 9.4; hgb 11.2; hct 36.2; mcv 85.4 plt 207; glucose 153; bun 18; creat 0.88; k+ 3.6; na++ 136; ca 8.7; GFR>60 liver normal albumin 3.6; hgb a1c 7.2; chol 124; ldl 50; trig 154; hdl 43; vit B 12: 1086 03-14-26: hgb a1c 7.2 04-07-22: wbc 10.9; hgb 11.1; hct 35.0; mcv 82.4 plt 233; glucose 147; bun 18; creat 0.88; k+ 3.6; na++ 139; ca 87; gfr >60; protein 6.5; albumin 3.6 chol 134; ldl 49; trig 169; hdl 51; urine micro-albumin 26.2     NO NEW LABS  Review of Systems  Constitutional:  Negative for malaise/fatigue.  Respiratory:  Negative for cough and shortness of breath.   Cardiovascular:  Negative for chest pain, palpitations and leg swelling.  Gastrointestinal:  Negative for abdominal pain, constipation and heartburn.  Musculoskeletal:  Positive for joint pain. Negative for back pain and myalgias.       Has hand pain   Skin: Negative.   Neurological:   Negative for dizziness.  Psychiatric/Behavioral:  The patient is not nervous/anxious.    Physical Exam Constitutional:      General: She is not in acute distress.    Appearance: She is well-developed. She is not diaphoretic.  Neck:     Thyroid: No thyromegaly.  Cardiovascular:     Rate and Rhythm: Normal rate and regular rhythm.     Pulses: Normal pulses.     Heart sounds: Normal heart sounds.  Pulmonary:     Effort: Pulmonary effort is normal. No respiratory distress.     Breath sounds: Normal breath sounds.  Abdominal:     General: Bowel sounds are normal. There is no distension.     Palpations: Abdomen is soft.     Tenderness: There is no abdominal tenderness.  Musculoskeletal:        General: Normal range of motion.     Cervical back: Neck supple.     Right lower leg: No edema.     Left lower leg: No edema.  Lymphadenopathy:     Cervical: No cervical adenopathy.  Skin:    General: Skin is warm and dry.  Neurological:     Mental Status: She is alert and oriented to person, place, and time.  Psychiatric:        Mood and Affect: Mood normal.      ASSESSMENT/ PLAN:  TODAY  CKD stage 3 due to type 2 diabetes mellitus: bun 18; creat 0.88; gfr >60  2. Normocytic anemia: hgb 11.2; hct 36.2  3. Hypertension associated with type 2 diabetes mellitus: b/p 134/74 will continue norvasc 10 mg daily asa 81 mg daily cozaar 12.5 mg daily   4. Dyslipidemia associated with type 2 diabetes mellitus: LDL 50 will continue zocor 10 mg daily   PREVIOUS   5. Aortic atherosclerosis: (ct 12-18-19) on statin  6. Type 2 diabetes mellitus with peripheral angiopathy: hgb a1c 7.2 will continue metformin 1 gm twice daily tradjenta 5 mg daily is on arb; asa statin  7.Gastroesophageal reflux disease without esophagitis: will continue prilosec 20 mg daily   8. OAB will continue myrbetriq 50 mg daily   9. Chronic generalized pain: will continue tylenol cr 650 mg three times daily voltaren gel  2 gm to both knees twice daily as needed; she has completed the prednisone taper for her sciatic pain.   10. Chronic non-seasonal allergic rhinitis: will continue claritin 10 mg daily singulair 10 mg daily and nasocort daily   11. Herpes: no recent outbreaks: will continue valtrex 500 mg daily   12. Vitamin B12 deficiency: level is 1086 will continue 1,000 mcg daily   13. Chronic constipation: will continue miralax daily; senna s daily  14. Major depression recurrent chronic: will continue cymbalta 20 mg twice daily (failed one wean) lamictal 25 mg twice daily for mood and melatonin 5 mg nightly for sleep     Ok Edwards NP [facility]   call [phone]

## 2022-06-24 ENCOUNTER — Encounter: Payer: Self-pay | Admitting: Adult Health

## 2022-06-24 ENCOUNTER — Non-Acute Institutional Stay (SKILLED_NURSING_FACILITY): Payer: Medicare Other | Admitting: Adult Health

## 2022-06-24 DIAGNOSIS — F339 Major depressive disorder, recurrent, unspecified: Secondary | ICD-10-CM | POA: Diagnosis not present

## 2022-06-24 DIAGNOSIS — G8929 Other chronic pain: Secondary | ICD-10-CM | POA: Diagnosis not present

## 2022-06-24 DIAGNOSIS — N183 Chronic kidney disease, stage 3 unspecified: Secondary | ICD-10-CM

## 2022-06-24 DIAGNOSIS — M5432 Sciatica, left side: Secondary | ICD-10-CM | POA: Diagnosis not present

## 2022-06-24 DIAGNOSIS — E1159 Type 2 diabetes mellitus with other circulatory complications: Secondary | ICD-10-CM | POA: Diagnosis not present

## 2022-06-24 DIAGNOSIS — R52 Pain, unspecified: Secondary | ICD-10-CM | POA: Diagnosis not present

## 2022-06-24 DIAGNOSIS — I152 Hypertension secondary to endocrine disorders: Secondary | ICD-10-CM | POA: Diagnosis not present

## 2022-06-24 DIAGNOSIS — E1151 Type 2 diabetes mellitus with diabetic peripheral angiopathy without gangrene: Secondary | ICD-10-CM | POA: Diagnosis not present

## 2022-06-24 DIAGNOSIS — N1831 Chronic kidney disease, stage 3a: Secondary | ICD-10-CM | POA: Diagnosis not present

## 2022-06-24 DIAGNOSIS — E1122 Type 2 diabetes mellitus with diabetic chronic kidney disease: Secondary | ICD-10-CM | POA: Diagnosis not present

## 2022-06-24 DIAGNOSIS — I700 Atherosclerosis of aorta: Secondary | ICD-10-CM

## 2022-06-24 NOTE — Progress Notes (Unsigned)
Location:  [place] Room Number: 115-W Place of Service:  SNF (31)   CODE STATUS: Full Code  Allergies  Allergen Reactions   Lisinopril    Penicillins Nausea Only    Has patient had a PCN reaction causing immediate rash, facial/tongue/throat swelling, SOB or lightheadedness with hypotension: Yes Has patient had a PCN reaction causing severe rash involving mucus membranes or skin necrosis: No Has patient had a PCN reaction that required hospitalization No Has patient had a PCN reaction occurring within the last 10 years: No If all of the above answers are "NO", then may proceed with Cephalosporin use.    Sulfa Antibiotics Nausea And Vomiting    Chief Complaint  Patient presents with   Acute Visit    Care plan meeting    HPI:  We have come together for her care plan meeting. *** BIMS 15/15 mood 9/30: nervous; decreased energy; not sleeping well. She is able to ambulate with a walker; no falls. She is independent with her adls. She is continent of bladder and bowel. Dietary: *** weight is 181.8 pounds down 4 pounds in the past 3 months. CON CHO diet appetite 50-100%. Therapy: ***.        She will continue to be followed for her chronic illnesses including: Aortic atherosclerosis  Hypertension associated with type 2 diabetes mellitus CKD stage 3 due to type 2 diabetes mellitus  Major depression recurrent chronic  Past Medical History:  Diagnosis Date   Anxiety    Bowel obstruction (HCC)    Burning with urination 01/04/2016   Diabetes mellitus with neuropathy    Genital herpes    GERD (gastroesophageal reflux disease)    Hematuria 01/04/2016   Herpes 12/22/2015   Hyperlipidemia    Hypertension    LLQ abdominal tenderness 01/04/2016   Neuropathy    Pelvic pressure in female 01/04/2016   Recurrent UTI    Sciatic nerve disease    Small bowel obstruction ([place]) 04/27/2016   Urinary frequency 01/04/2016   Urticaria     Past Surgical History:   Procedure Laterality Date   ABDOMINAL HYSTERECTOMY     APPLICATION OF INTRAOPERATIVE CT SCAN N/A 12/19/2018   Procedure: APPLICATION OF INTRAOPERATIVE CT SCAN;  Surgeon: Erline Levine, MD;  Location: [place];  Service: Neurosurgery;  Laterality: N/A;   bowel obstruction     COLON SURGERY     blocked colon   POSTERIOR CERVICAL FUSION/FORAMINOTOMY N/A 12/19/2018   Procedure: Cervical Seven to Thoracic One Posterior cervical fusion;  Surgeon: Erline Levine, MD;  Location: [place];  Service: Neurosurgery;  Laterality: N/A;  Cervical Seven to Thoracic One Posterior cervical fusion    Social History   Socioeconomic History   Marital status: Widowed    Spouse name: Not on file   Number of children: Not on file   Years of education: Not on file   Highest education level: Not on file  Occupational History   Occupation: retired   Tobacco Use   Smoking status: Never   Smokeless tobacco: Current    Types: Snuff  Vaping Use   Vaping Use: Never used  Substance and Sexual Activity   Alcohol use: No   Drug use: No   Sexual activity: Not Currently    Birth control/protection: Surgical    Comment: hyst  Other Topics Concern   Not on file  Social History Narrative   Long term resident of [facility]    Social Determinants of Radio broadcast assistant  Strain: Not on file  Food Insecurity: Not on file  Transportation Needs: Not on file  Physical Activity: Not on file  Stress: Not on file  Social Connections: Not on file  Intimate Partner Violence: Not on file   Family History  Problem Relation Age of Onset   Diabetes Mother    Heart disease Brother    Hypertension Brother    Diabetes Brother    Hypertension Daughter    Diabetes Brother    Diabetes Brother    Alzheimer's disease Brother    Anxiety disorder Daughter       VITAL SIGNS BP 134/74   Pulse 74   Temp 97.6 F (36.4 C)   Resp (!) 22   Ht 5' (1.524 m)   Wt 181 lb (82.1 kg)   SpO2 97%   BMI 35.35 kg/m   Outpatient  Encounter Medications as of 06/24/2022  Medication Sig   acetaminophen (TYLENOL) 650 MG CR tablet Take 650 mg by mouth in the morning and at bedtime.   amLODipine (NORVASC) 10 MG tablet Take 1 tablet (10 mg total) by mouth daily.   aspirin 81 MG chewable tablet Chew 81 mg by mouth daily.   Cranberry 500 MG CAPS Take by mouth in the morning and at bedtime. For UTI prevention   cyanocobalamin 1000 MCG tablet Take 1,000 mcg by mouth daily. 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   DULoxetine (CYMBALTA) 60 MG capsule Take 60 mg by mouth daily. Pain management   lamoTRIgine (LAMICTAL) 25 MG tablet Take 25 mg by mouth daily.   linagliptin (TRADJENTA) 5 MG TABS tablet Take 5 mg by mouth daily.   loratadine (CLARITIN) 10 MG tablet Take 10 mg by mouth daily.   losartan (COZAAR) 25 MG tablet Take 12.5 mg by mouth daily.   Melatonin 5 MG TABS Take 5 mg by mouth at bedtime.   meloxicam (MOBIC) 15 MG tablet Take 15 mg by mouth daily. cord compression   Menthol, Topical Analgesic, (BIOFREEZE) 4 % GEL Apply topically. Every Shift - PRN   metFORMIN (GLUCOPHAGE) 1000 MG tablet Take 1,000 mg by mouth 2 (two) times daily with a meal.    mirabegron ER (MYRBETRIQ) 50 MG TB24 tablet Take 50 mg by mouth daily.   montelukast (SINGULAIR) 10 MG tablet Take 1 tablet (10 mg total) by mouth at bedtime.   NON FORMULARY Diet: Consistent Carbohydrate   omeprazole (PRILOSEC) 20 MG capsule Take 20 mg by mouth daily. Special Instructions:  *TAKE ON AN EMPTY STOMACH* *DO NOT CRUSH*   polyethylene glycol (MIRALAX / GLYCOLAX) packet Take 17 g by mouth daily.   PRESCRIPTION MEDICATION MICROGUARD POWDER  powder; amt: SPARINGLY; topical Special Instructions: REDNESS UNDER BREAST Once A Day   PRESCRIPTION MEDICATION annusol cream  cream; amt: sparingly; rectal Special Instructions: hemmorhoids Once A Day - PRN   senna-docusate  (SENOKOT-S) 8.6-50 MG tablet Take 1 tablet by mouth at bedtime.    sertraline (ZOLOFT) 50 MG tablet Take 50 mg by mouth daily.   simvastatin (ZOCOR) 10 MG tablet Take 10 mg by mouth at bedtime.    triamcinolone (NASACORT) 55 MCG/ACT AERO nasal inhaler Place 1 spray into the nose daily.   valACYclovir (VALTREX) 500 MG tablet Take 500 mg by mouth daily.    [DISCONTINUED] sertraline (ZOLOFT) 25 MG tablet Take 25 mg by mouth daily.   No facility-administered encounter medications on file as  of 06/24/2022.     SIGNIFICANT DIAGNOSTIC EXAMS  PREVIOUS  07-16-20 DEXA: t score 0.825   NO NEW EXAMS.   LABS REVIEWED PREVIOUS;    07-23-21: urine culture: e-coli rocephin 08-06-21: glucose 139; bun 20; creat 0.97; k+ 3.8; na++ 137; ca 8.9; GFR 59; mag 1.9  09-30-21: wbc 9.4; hgb 11.2; hct 36.2; mcv 85.4 plt 207; glucose 153; bun 18; creat 0.88; k+ 3.6; na++ 136; ca 8.7; GFR>60 liver normal albumin 3.6; hgb a1c 7.2; chol 124; ldl 50; trig 154; hdl 43; vit B 12: 1086 03-14-26: hgb a1c 7.2 04-07-22: wbc 10.9; hgb 11.1; hct 35.0; mcv 82.4 plt 233; glucose 147; bun 18; creat 0.88; k+ 3.6; na++ 139; ca 87; gfr >60; protein 6.5; albumin 3.6 chol 134; ldl 49; trig 169; hdl 51; urine micro-albumin 26.2     NO NEW LABS  Review of Systems  Constitutional:  Negative for malaise/fatigue.  Respiratory:  Negative for cough and shortness of breath.   Cardiovascular:  Negative for chest pain, palpitations and leg swelling.  Gastrointestinal:  Negative for abdominal pain, constipation and heartburn.  Musculoskeletal:  Positive for joint pain and myalgias. Negative for back pain.       Shoulder and hip pain   Skin: Negative.   Neurological:  Negative for dizziness.  Psychiatric/Behavioral:  The patient is not nervous/anxious.     Physical Exam Constitutional:      General: She is not in acute distress.    Appearance: She is well-developed. She is not diaphoretic.  Neck:     Thyroid: No thyromegaly.   Cardiovascular:     Rate and Rhythm: Normal rate and regular rhythm.     Pulses: Normal pulses.     Heart sounds: Normal heart sounds.  Pulmonary:     Effort: Pulmonary effort is normal. No respiratory distress.     Breath sounds: Normal breath sounds.  Abdominal:     General: Bowel sounds are normal. There is no distension.     Palpations: Abdomen is soft.     Tenderness: There is no abdominal tenderness.  Musculoskeletal:        General: Normal range of motion.     Cervical back: Neck supple.     Right lower leg: No edema.     Left lower leg: No edema.  Lymphadenopathy:     Cervical: No cervical adenopathy.  Skin:    General: Skin is warm and dry.  Neurological:     Mental Status: She is alert and oriented to person, place, and time.  Psychiatric:        Mood and Affect: Mood normal.       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   Time spent with patient 40 minutes: medications; plan of care; goals of care.    Synthia Innocent NP [facility]  call [phone]

## 2022-06-29 ENCOUNTER — Non-Acute Institutional Stay (SKILLED_NURSING_FACILITY): Payer: Medicare Other | Admitting: Adult Health

## 2022-06-29 ENCOUNTER — Encounter: Payer: Self-pay | Admitting: Adult Health

## 2022-06-29 ENCOUNTER — Ambulatory Visit (HOSPITAL_COMMUNITY)
Admission: RE | Admit: 2022-06-29 | Discharge: 2022-06-29 | Disposition: A | Discharge status: Home / Self Care | Payer: Medicare Other | Source: Ambulatory Visit | Attending: Internal Medicine | Admitting: Internal Medicine

## 2022-06-29 DIAGNOSIS — M4807 Spinal stenosis, lumbosacral region: Secondary | ICD-10-CM | POA: Diagnosis not present

## 2022-06-29 DIAGNOSIS — M5127 Other intervertebral disc displacement, lumbosacral region: Secondary | ICD-10-CM | POA: Diagnosis not present

## 2022-06-29 DIAGNOSIS — M5432 Sciatica, left side: Secondary | ICD-10-CM | POA: Insufficient documentation

## 2022-06-29 DIAGNOSIS — M47817 Spondylosis without myelopathy or radiculopathy, lumbosacral region: Secondary | ICD-10-CM | POA: Diagnosis not present

## 2022-06-29 NOTE — Progress Notes (Signed)
Location:  [facility] Room Number: 115/W Place of Service:  SNF (31)   CODE STATUS: Full code   Allergies  Allergen Reactions   Lisinopril    Penicillins Nausea Only    Has patient had a PCN reaction causing immediate rash, facial/tongue/throat swelling, SOB or lightheadedness with hypotension: Yes Has patient had a PCN reaction causing severe rash involving mucus membranes or skin necrosis: No Has patient had a PCN reaction that required hospitalization No Has patient had a PCN reaction occurring within the last 10 years: No If all of the above answers are "NO", then may proceed with Cephalosporin use.    Sulfa Antibiotics Nausea And Vomiting    Chief Complaint  Patient presents with   Acute Visit    Pain management     HPI:  She is presently being treated with celebrex 200 mg daily . She is having lower back pain with sciatic with pain radiating down left leg. The pain is intense. She is unable to rest. She is able to ambulate and able to participate in therapy. She completed her prednisone taper with little relief of pain.   Past Medical History:  Diagnosis Date   Anxiety    Bowel obstruction (HCC)    Burning with urination 01/04/2016   Diabetes mellitus with neuropathy    Genital herpes    GERD (gastroesophageal reflux disease)    Hematuria 01/04/2016   Herpes 12/22/2015   Hyperlipidemia    Hypertension    LLQ abdominal tenderness 01/04/2016   Neuropathy    Pelvic pressure in female 01/04/2016   Recurrent UTI    Sciatic nerve disease    Small bowel obstruction (HCC) 04/27/2016   Urinary frequency 01/04/2016   Urticaria     Past Surgical History:  Procedure Laterality Date   ABDOMINAL HYSTERECTOMY     APPLICATION OF INTRAOPERATIVE CT SCAN N/A 12/19/2018   Procedure: APPLICATION OF INTRAOPERATIVE CT SCAN;  Surgeon: Maeola Harman, MD;  Location: [facility] OR;  Service: Neurosurgery;  Laterality: N/A;   bowel obstruction     COLON SURGERY      blocked colon   POSTERIOR CERVICAL FUSION/FORAMINOTOMY N/A 12/19/2018   Procedure: Cervical Seven to Thoracic One Posterior cervical fusion;  Surgeon: Maeola Harman, MD;  Location: [facility] OR;  Service: Neurosurgery;  Laterality: N/A;  Cervical Seven to Thoracic One Posterior cervical fusion    Social History   Socioeconomic History   Marital status: Widowed    Spouse name: Not on file   Number of children: Not on file   Years of education: Not on file   Highest education level: Not on file  Occupational History   Occupation: retired   Tobacco Use   Smoking status: Never   Smokeless tobacco: Current    Types: Snuff  Vaping Use   Vaping Use: Never used  Substance and Sexual Activity   Alcohol use: No   Drug use: No   Sexual activity: Not Currently    Birth control/protection: Surgical    Comment: hyst  Other Topics Concern   Not on file  Social History Narrative   Long term resident of [facility]    Social Determinants of Health   Financial Resource Strain: Not on file  Food Insecurity: Not on file  Transportation Needs: Not on file  Physical Activity: Not on file  Stress: Not on file  Social Connections: Not on file  Intimate Partner Violence: Not on file   Family History  Problem Relation Age of  Onset   Diabetes Mother    Heart disease Brother    Hypertension Brother    Diabetes Brother    Hypertension Daughter    Diabetes Brother    Diabetes Brother    Alzheimer's disease Brother    Anxiety disorder Daughter       VITAL SIGNS BP (!) 150/80   Pulse 83   Temp 98.2 F (36.8 C)   Resp 20   Ht 5' (1.524 m)   Wt 181 lb (82.1 kg)   SpO2 94%   BMI 35.35 kg/m   Outpatient Encounter Medications as of 06/29/2022  Medication Sig   acetaminophen (TYLENOL) 650 MG CR tablet Take 650 mg by mouth in the morning and at bedtime.   amLODipine (NORVASC) 10 MG tablet Take 1 tablet (10 mg total) by mouth daily.   aspirin 81 MG chewable tablet Chew 81 mg by mouth daily.    celecoxib (CELEBREX) 200 MG capsule Take 1 capsule by mouth 2 (two) times daily. For back pain. Give with food.   Cranberry 500 MG CAPS Take by mouth in the morning and at bedtime. For UTI prevention   cyanocobalamin 1000 MCG tablet Take 1,000 mcg by mouth daily. 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   DULoxetine (CYMBALTA) 60 MG capsule Take 60 mg by mouth daily. Pain management   lamoTRIgine (LAMICTAL) 25 MG tablet Take 25 mg by mouth daily.   linagliptin (TRADJENTA) 5 MG TABS tablet Take 5 mg by mouth daily.   loratadine (CLARITIN) 10 MG tablet Take 10 mg by mouth daily.   losartan (COZAAR) 25 MG tablet Take 12.5 mg by mouth daily.   Melatonin 5 MG TABS Take 5 mg by mouth at bedtime.   Menthol, Topical Analgesic, (BIOFREEZE) 4 % GEL Apply topically. Every Shift - PRN   metFORMIN (GLUCOPHAGE) 1000 MG tablet Take 1,000 mg by mouth 2 (two) times daily with a meal.    mirabegron ER (MYRBETRIQ) 50 MG TB24 tablet Take 50 mg by mouth daily.   montelukast (SINGULAIR) 10 MG tablet Take 1 tablet (10 mg total) by mouth at bedtime.   NON FORMULARY Diet: Consistent Carbohydrate   omeprazole (PRILOSEC) 20 MG capsule Take 20 mg by mouth daily. Special Instructions:  *TAKE ON AN EMPTY STOMACH* *DO NOT CRUSH*   polyethylene glycol (MIRALAX / GLYCOLAX) packet Take 17 g by mouth daily.   PRESCRIPTION MEDICATION MICROGUARD POWDER  powder; amt: SPARINGLY; topical Special Instructions: REDNESS UNDER BREAST Once A Day   PRESCRIPTION MEDICATION annusol cream  cream; amt: sparingly; rectal Special Instructions: hemmorhoids Once A Day - PRN   senna-docusate (SENOKOT-S) 8.6-50 MG tablet Take 1 tablet by mouth at bedtime.    sertraline (ZOLOFT) 50 MG tablet Take 50 mg by mouth daily.   simvastatin (ZOCOR) 10 MG tablet Take 10 mg by mouth at bedtime.    triamcinolone (NASACORT) 55 MCG/ACT AERO nasal inhaler Place  1 spray into the nose daily.   valACYclovir (VALTREX) 500 MG tablet Take 500 mg by mouth daily.    [DISCONTINUED] meloxicam (MOBIC) 15 MG tablet Take 15 mg by mouth daily. cord compression   No facility-administered encounter medications on file as of 06/29/2022.     SIGNIFICANT DIAGNOSTIC EXAMS   PREVIOUS  07-16-20 DEXA: t score 0.825   NO NEW EXAMS.   LABS REVIEWED PREVIOUS;    07-23-21: urine culture: e-coli rocephin 08-06-21: glucose 139; bun  20; creat 0.97; k+ 3.8; na++ 137; ca 8.9; GFR 59; mag 1.9  09-30-21: wbc 9.4; hgb 11.2; hct 36.2; mcv 85.4 plt 207; glucose 153; bun 18; creat 0.88; k+ 3.6; na++ 136; ca 8.7; GFR>60 liver normal albumin 3.6; hgb a1c 7.2; chol 124; ldl 50; trig 154; hdl 43; vit B 12: 1086 03-14-26: hgb a1c 7.2 04-07-22: wbc 10.9; hgb 11.1; hct 35.0; mcv 82.4 plt 233; glucose 147; bun 18; creat 0.88; k+ 3.6; na++ 139; ca 87; gfr >60; protein 6.5; albumin 3.6 chol 134; ldl 49; trig 169; hdl 51; urine micro-albumin 26.2     NO NEW LABS  Review of Systems  Constitutional:  Negative for malaise/fatigue.  Respiratory:  Negative for cough and shortness of breath.   Cardiovascular:  Negative for chest pain, palpitations and leg swelling.  Gastrointestinal:  Negative for abdominal pain, constipation and heartburn.  Musculoskeletal:  Positive for back pain and myalgias. Negative for joint pain.  Skin: Negative.   Neurological:  Negative for dizziness.  Psychiatric/Behavioral:  The patient is not nervous/anxious.    Physical Exam Constitutional:      General: She is not in acute distress.    Appearance: She is well-developed. She is not diaphoretic.  Neck:     Thyroid: No thyromegaly.  Cardiovascular:     Rate and Rhythm: Normal rate and regular rhythm.     Pulses: Normal pulses.     Heart sounds: Normal heart sounds.  Pulmonary:     Effort: Pulmonary effort is normal. No respiratory distress.     Breath sounds: Normal breath sounds.  Abdominal:     General:  Bowel sounds are normal. There is no distension.     Palpations: Abdomen is soft.     Tenderness: There is no abdominal tenderness.  Musculoskeletal:        General: Normal range of motion.     Cervical back: Neck supple.     Right lower leg: No edema.     Left lower leg: No edema.  Lymphadenopathy:     Cervical: No cervical adenopathy.  Skin:    General: Skin is warm and dry.  Neurological:     Mental Status: She is alert and oriented to person, place, and time.  Psychiatric:        Mood and Affect: Mood normal.       ASSESSMENT/ PLAN:  TODAY  Sciatic pain on the left: will increase celebrex to 200 mg twice daily. Will get an MRI of her lumbar/sacral spine without contrast .  Will monitor her status.    Synthia Innocent NP [facility]   call [phone]

## 2022-06-30 ENCOUNTER — Other Ambulatory Visit (HOSPITAL_COMMUNITY)
Admission: RE | Admit: 2022-06-30 | Discharge: 2022-06-30 | Disposition: A | Discharge status: Home / Self Care | Payer: Medicare Other | Source: Skilled Nursing Facility | Attending: Adult Health | Admitting: Adult Health

## 2022-06-30 DIAGNOSIS — E1169 Type 2 diabetes mellitus with other specified complication: Secondary | ICD-10-CM | POA: Insufficient documentation

## 2022-06-30 LAB — BASIC METABOLIC PANEL
Anion gap: 10 (ref 5–15)
BUN: 17 mg/dL (ref 8–23)
CO2: 28 mmol/L (ref 22–32)
Calcium: 9.1 mg/dL (ref 8.9–10.3)
Chloride: 100 mmol/L (ref 98–111)
Creatinine, Ser: 0.81 mg/dL (ref 0.44–1.00)
GFR, Estimated: >60 mL/min (ref >60)
Glucose, Bld: 150 mg/dL — ABNORMAL HIGH (ref 70–99)
Potassium: 3.8 mmol/L (ref 3.5–5.1)
Sodium: 138 mmol/L (ref 135–145)

## 2022-07-02 ENCOUNTER — Emergency Department (HOSPITAL_COMMUNITY)
Admission: EM | Admit: 2022-07-02 | Discharge: 2022-07-02 | Disposition: A | Discharge status: Skilled Nursing Facility | Payer: Medicare Other | Attending: Emergency Medicine | Admitting: Emergency Medicine

## 2022-07-02 ENCOUNTER — Other Ambulatory Visit: Payer: Self-pay

## 2022-07-02 ENCOUNTER — Encounter (HOSPITAL_COMMUNITY): Payer: Self-pay | Admitting: *Deleted

## 2022-07-02 DIAGNOSIS — M5442 Lumbago with sciatica, left side: Secondary | ICD-10-CM | POA: Insufficient documentation

## 2022-07-02 DIAGNOSIS — M5432 Sciatica, left side: Secondary | ICD-10-CM

## 2022-07-02 DIAGNOSIS — Z7982 Long term (current) use of aspirin: Secondary | ICD-10-CM | POA: Diagnosis not present

## 2022-07-02 DIAGNOSIS — E119 Type 2 diabetes mellitus without complications: Secondary | ICD-10-CM | POA: Insufficient documentation

## 2022-07-02 DIAGNOSIS — Z79899 Other long term (current) drug therapy: Secondary | ICD-10-CM | POA: Diagnosis not present

## 2022-07-02 DIAGNOSIS — I10 Essential (primary) hypertension: Secondary | ICD-10-CM | POA: Diagnosis not present

## 2022-07-02 DIAGNOSIS — Z7984 Long term (current) use of oral hypoglycemic drugs: Secondary | ICD-10-CM | POA: Insufficient documentation

## 2022-07-02 DIAGNOSIS — M79605 Pain in left leg: Secondary | ICD-10-CM | POA: Diagnosis present

## 2022-07-02 LAB — URINALYSIS, ROUTINE W REFLEX MICROSCOPIC
Bilirubin Urine: NEGATIVE
Glucose, UA: >=500 mg/dL — AB
Hgb urine dipstick: NEGATIVE
Ketones, ur: NEGATIVE mg/dL
Leukocytes,Ua: NEGATIVE
Nitrite: NEGATIVE
Protein, ur: NEGATIVE mg/dL
Specific Gravity, Urine: 1.012 (ref 1.005–1.030)
pH: 6 (ref 5.0–8.0)

## 2022-07-02 LAB — BASIC METABOLIC PANEL
Anion gap: 12 (ref 5–15)
BUN: 18 mg/dL (ref 8–23)
CO2: 22 mmol/L (ref 22–32)
Calcium: 8.9 mg/dL (ref 8.9–10.3)
Chloride: 103 mmol/L (ref 98–111)
Creatinine, Ser: 0.89 mg/dL (ref 0.44–1.00)
GFR, Estimated: >60 mL/min (ref >60)
Glucose, Bld: 138 mg/dL — ABNORMAL HIGH (ref 70–99)
Potassium: 4.2 mmol/L (ref 3.5–5.1)
Sodium: 137 mmol/L (ref 135–145)

## 2022-07-02 LAB — CBC WITH DIFFERENTIAL/PLATELET
Abs Immature Granulocytes: 0.04 10*3/uL (ref 0.00–0.07)
Basophils Absolute: 0.1 10*3/uL (ref 0.0–0.1)
Basophils Relative: 1 %
Eosinophils Absolute: 0.4 10*3/uL (ref 0.0–0.5)
Eosinophils Relative: 3 %
HCT: 35.4 % — ABNORMAL LOW (ref 36.0–46.0)
Hemoglobin: 11.1 g/dL — ABNORMAL LOW (ref 12.0–15.0)
Immature Granulocytes: 0 %
Lymphocytes Relative: 18 %
Lymphs Abs: 1.9 10*3/uL (ref 0.7–4.0)
MCH: 25.9 pg — ABNORMAL LOW (ref 26.0–34.0)
MCHC: 31.4 g/dL (ref 30.0–36.0)
MCV: 82.5 fL (ref 80.0–100.0)
Monocytes Absolute: 1 10*3/uL (ref 0.1–1.0)
Monocytes Relative: 10 %
Neutro Abs: 7.1 10*3/uL (ref 1.7–7.7)
Neutrophils Relative %: 68 %
Platelets: 230 10*3/uL (ref 150–400)
RBC: 4.29 MIL/uL (ref 3.87–5.11)
RDW: 16.4 % — ABNORMAL HIGH (ref 11.5–15.5)
WBC: 10.5 10*3/uL (ref 4.0–10.5)
nRBC: 0 % (ref 0.0–0.2)

## 2022-07-02 MED ORDER — HYDROCODONE-ACETAMINOPHEN 5-325 MG PO TABS
1.0000 | ORAL_TABLET | Freq: Once | ORAL | Status: AC
  Administered 2022-07-02: 1 via ORAL
  Filled 2022-07-02: qty 1

## 2022-07-02 MED ORDER — FENTANYL CITRATE PF 50 MCG/ML IJ SOSY
25.0000 ug | PREFILLED_SYRINGE | Freq: Once | INTRAMUSCULAR | Status: AC
Start: 2022-07-02 — End: 2022-07-02
  Administered 2022-07-02: 25 ug via INTRAVENOUS
  Filled 2022-07-02: qty 1

## 2022-07-02 MED ORDER — HYDROCODONE-ACETAMINOPHEN 5-325 MG PO TABS: 1.0000 | ORAL_TABLET | ORAL | 0 refills | Status: DC | PRN

## 2022-07-02 MED ORDER — HYDROCODONE-ACETAMINOPHEN 5-325 MG PO TABS: ORAL_TABLET | ORAL | 0 refills | Status: DC

## 2022-07-02 NOTE — ED Provider Notes (Signed)
[facility] EMERGENCY DEPARTMENT Provider Note   CSN: [id number] Arrival date & time: 07/02/22  1324     History {Add pertinent medical, surgical, social history, OB history to HPI:1} Chief Complaint  Patient presents with   Leg Pain    [name] [name] is a 81 y.o. female.   Leg Pain Associated symptoms: back pain   Associated symptoms: no fever and no neck pain        [name] [name] is a 81 y.o. female with past medical history significant for recurrent UTIs, hypertension, type 2 diabetes, neuropathy, and chronic back pain with sciatica.  She currently resides at the [facility].  She was brought to  the Emergency Department for evaluation of left leg pain and pain of her left buttock area.  She states pain has been excruciating for weeks.  She has been taking Tylenol and Celebrex with out significant relief.  She had MRI of her lumbar spine on 06/29/2022 that showed some bulging disc.  She describes some intermittent numbness and tingling of both legs, but worse in the left.  She states that her daughter is concerned that her pain is worsening.  Patient denies any recent injury, abdominal pain, urine or bowel changes.  No fever or chills redness or swelling of her left leg.  She ambulates with a walker at baseline.  I spoke with patient's caregiver at the [facility] who provided additional information.  She confirmed that there has been no recent injury.  States that the patient at times "hollers out" in pain.  She confirms there has been no recent fall or injury.  Home Medications Prior to Admission medications   Medication Sig Start Date End Date Taking? Authorizing Provider  acetaminophen (TYLENOL) 650 MG CR tablet Take 650 mg by mouth in the morning and at bedtime. 01/25/21   [provider]  amLODipine (NORVASC) 10 MG tablet Take 1 tablet (10 mg total) by mouth daily. 12/16/19   Bethel Born, PA-C  aspirin 81 MG chewable tablet Chew 81 mg by mouth daily.     [provider]  celecoxib (CELEBREX) 200 MG capsule Take 1 capsule by mouth 2 (two) times daily. For back pain. Give with food.    [provider]  Cranberry 500 MG CAPS Take by mouth in the morning and at bedtime. For UTI prevention    [provider]  cyanocobalamin 1000 MCG tablet Take 1,000 mcg by mouth daily. Special Instructions: for vit B 12 deficiency 06/19/20   [provider]  Dextromethorphan-guaiFENesin 10-100 MG/5ML liquid Take 5 mLs by mouth at bedtime as needed.    [provider]  DULoxetine (CYMBALTA) 60 MG capsule Take 60 mg by mouth daily. Pain management    [provider]  lamoTRIgine (LAMICTAL) 25 MG tablet Take 25 mg by mouth daily. 10/01/20   [provider]  linagliptin (TRADJENTA) 5 MG TABS tablet Take 5 mg by mouth daily.    [provider]  loratadine (CLARITIN) 10 MG tablet Take 10 mg by mouth daily.    [provider]  losartan (COZAAR) 25 MG tablet Take 12.5 mg by mouth daily.    [provider]  Melatonin 5 MG TABS Take 5 mg by mouth at bedtime.    [provider]  Menthol, Topical Analgesic, (BIOFREEZE) 4 % GEL Apply topically. Every Shift - PRN    [provider]  metFORMIN (GLUCOPHAGE) 1000 MG tablet Take 1,000 mg by mouth 2 (two) times  daily with a meal.     [provider]  mirabegron ER (MYRBETRIQ) 50 MG TB24 tablet Take 50 mg by mouth daily.    [provider]  montelukast (SINGULAIR) 10 MG tablet Take 1 tablet (10 mg total) by mouth at bedtime. 08/22/18   Kozlow, Donnamarie Poag, MD  NON FORMULARY Diet: Consistent Carbohydrate    [provider]  omeprazole (PRILOSEC) 20 MG capsule Take 20 mg by mouth daily. Special Instructions:  *TAKE ON AN EMPTY STOMACH* *DO NOT CRUSH*    [provider]  polyethylene glycol (MIRALAX / GLYCOLAX) packet Take 17 g by mouth daily.    [provider]  PRESCRIPTION MEDICATION MICROGUARD  POWDER  powder; amt: SPARINGLY; topical Special Instructions: REDNESS UNDER BREAST Once A Day    [provider]  PRESCRIPTION MEDICATION annusol cream  cream; amt: sparingly; rectal Special Instructions: hemmorhoids Once A Day - PRN    [provider]  senna-docusate (SENOKOT-S) 8.6-50 MG tablet Take 1 tablet by mouth at bedtime.     [provider]  sertraline (ZOLOFT) 50 MG tablet Take 50 mg by mouth daily.    [provider]  simvastatin (ZOCOR) 10 MG tablet Take 10 mg by mouth at bedtime.     [provider]  triamcinolone (NASACORT) 55 MCG/ACT AERO nasal inhaler Place 1 spray into the nose daily.    [provider]  valACYclovir (VALTREX) 500 MG tablet Take 500 mg by mouth daily.  05/05/16   [provider]      Allergies    Lisinopril, Penicillins, and Sulfa antibiotics    Review of Systems   Review of Systems  Constitutional:  Negative for chills and fever.  Respiratory:  Negative for shortness of breath.   Cardiovascular:  Negative for chest pain.  Gastrointestinal:  Negative for abdominal pain, nausea and vomiting.  Genitourinary:  Negative for difficulty urinating and dysuria.  Musculoskeletal:  Positive for back pain. Negative for joint swelling, neck pain and neck stiffness.  Skin:  Negative for color change, rash and wound.  Neurological:  Positive for numbness. Negative for weakness.  Psychiatric/Behavioral:  Negative for confusion.     Physical Exam Updated Vital Signs BP (!) 170/66 (BP Location: Left Arm)   Pulse 78   Temp 98.3 F (36.8 C) (Oral)   Resp 20   Ht 5' (1.524 m)   Wt 81.6 kg   SpO2 95%   BMI 35.15 kg/m  Physical Exam Vitals and nursing note reviewed.  Constitutional:      General: She is not in acute distress.    Appearance: Normal appearance. She is not ill-appearing.  Cardiovascular:     Rate and Rhythm: Normal rate and regular rhythm.     Pulses: Normal pulses.  Pulmonary:      Effort: Pulmonary effort is normal. No respiratory distress.  Abdominal:     Palpations: Abdomen is soft.     Tenderness: There is no abdominal tenderness. There is no right CVA tenderness or left CVA tenderness.  Musculoskeletal:        General: Tenderness present. No swelling, deformity or signs of injury.     Lumbar back: Tenderness and bony tenderness present. No edema. Positive left straight leg raise test.     Comments: Tenderness to palpation of the left SI joint and lower lumbar spine.  No bony step-offs.  Pain with straight leg raise on the left at 10 degrees.  Skin:    General: Skin is warm.  Capillary Refill: Capillary refill takes less than 2 seconds.     Findings: No erythema or rash.  Neurological:     General: No focal deficit present.     Mental Status: She is alert.     Sensory: No sensory deficit.     Motor: No weakness.     ED Results / Procedures / Treatments   Labs (all labs ordered are listed, but only abnormal results are displayed) Labs Reviewed - No data to display  EKG None  Radiology No results found.  Procedures Procedures  {Document cardiac monitor, telemetry assessment procedure when appropriate:1}  Medications Ordered in ED Medications - No data to display  ED Course/ Medical Decision Making/ A&P                           Medical Decision Making Patient here from local pain center for evaluation of left leg pain.  Patient reports pain has been persistent for several weeks.  Gradually worsening.  She had MRI on 06/29/2022 that shows multilevel degenerative changes as detailed above are similar to the prior examination. There is no high-grade canal narrowing. Left foraminal and subarticular recess narrowing are greatest at L4-L5 and L5-S1.  On exam, patient has tenderness palpation of the left SI joint space and lower lumbar spine.  Dorsalis pedis and posterior tibial pulses are present.  There is no excessive warmth or erythema of the  lower extremities, there is no palpable cord or appreciable swelling of the left leg.  Differential diagnosis would include radicular pain secondary to her back, DVT.  I have low clinical suspicion given her exam for the presence of DVT.  Will obtain labs and urinalysis for further evaluation.  Patient is mentating well.  There are no focal neurodeficits on my exam.  Amount and/or Complexity of Data Reviewed Labs: ordered.    Details: Labs interpreted by me Discussion of management or test interpretation with external provider(s): I have personally reviewed MRI results from 06/29/2022.     {Document critical care time when appropriate:1} {Document review of labs and clinical decision tools ie heart score, Chads2Vasc2 etc:1}  {Document your independent review of radiology images, and any outside records:1} {Document your discussion with family members, caretakers, and with consultants:1} {Document social determinants of health affecting pt's care:1} {Document your decision making why or why not admission, treatments were needed:1} Final Clinical Impression(s) / ED Diagnoses Final diagnoses:  None    Rx / DC Orders ED Discharge Orders     None

## 2022-07-02 NOTE — ED Notes (Signed)
Pt used bsc with yellow clear urine

## 2022-07-02 NOTE — ED Notes (Signed)
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

## 2022-07-02 NOTE — Discharge Instructions (Signed)
Take the pain medication as directed.  Your leg pain may be coming from the nerves that originated in your lower back.  Continue taking your Celebrex as directed.  Avoid taking any additional Tylenol while taking the hydrocodone as this has Tylenol in it.  Please follow-up with your primary care provider for recheck, I have also listed the neurosurgery group in [facility] to follow-up with regarding your low back pain.  Return to the emergency department for any new or worsening symptoms.

## 2022-07-02 NOTE — ED Triage Notes (Signed)
Pt c/o left leg pain; no redness or swelling noted  Pt is resident of [facility]

## 2022-07-02 NOTE — ED Notes (Signed)
[facility] states they would send a tech to pick up the patient and patient sent back to [facility] with oxycodone prepack.

## 2022-07-02 NOTE — ED Notes (Signed)
Attempted to call report to [facility]. No answer at this time.

## 2022-07-04 ENCOUNTER — Other Ambulatory Visit: Payer: Self-pay | Admitting: Adult Health

## 2022-07-04 ENCOUNTER — Non-Acute Institutional Stay (SKILLED_NURSING_FACILITY): Payer: Medicare Other | Admitting: Adult Health

## 2022-07-04 ENCOUNTER — Telehealth: Payer: Self-pay

## 2022-07-04 ENCOUNTER — Encounter: Payer: Self-pay | Admitting: Adult Health

## 2022-07-04 DIAGNOSIS — M5432 Sciatica, left side: Secondary | ICD-10-CM | POA: Diagnosis not present

## 2022-07-04 DIAGNOSIS — M5442 Lumbago with sciatica, left side: Secondary | ICD-10-CM

## 2022-07-04 MED ORDER — PREGABALIN 25 MG PO CAPS: 25.0000 mg | ORAL_CAPSULE | Freq: Every day | ORAL | 0 refills | Status: DC

## 2022-07-04 MED FILL — Hydrocodone-Acetaminophen Tab 5-325 MG: ORAL | Qty: 6 | Status: AC

## 2022-07-04 NOTE — Telephone Encounter (Signed)
Transition Care Management Unsuccessful Follow-up Telephone Call  Date of discharge and from where: [facility]  Attempts:  1st Attempt  Reason for unsuccessful TCM follow-up call:  Unable to reach patient, Unable to be reached ;due to release into a skilled nursing facility.

## 2022-07-04 NOTE — Progress Notes (Unsigned)
Location:  [facility] Room Number: 115-W Place of Service:  SNF (31)   CODE STATUS: Full Code  Allergies  Allergen Reactions   Lisinopril    Penicillins Nausea Only    Has patient had a PCN reaction causing immediate rash, facial/tongue/throat swelling, SOB or lightheadedness with hypotension: Yes Has patient had a PCN reaction causing severe rash involving mucus membranes or skin necrosis: No Has patient had a PCN reaction that required hospitalization No Has patient had a PCN reaction occurring within the last 10 years: No If all of the above answers are "NO", then may proceed with Cephalosporin use.    Sulfa Antibiotics Nausea And Vomiting    Chief Complaint  Patient presents with   Acute Visit    Follow-up ED visit    HPI:  She was taken to the ED over this past weekend for uncontrolled back pain. She was given fentanyl small dose and was given a prescription for vicodin 5/325 mg tabs. She is complaining of back pain on the left side with sciatica down her left leg. She tells me that the pain is sharp at times; and does have some numbness present. She cannot tolerate gabapentin. She is willing to try lyrica at night for her pain relief and a low dose prednisone. She will need a neurology consult.   Past Medical History:  Diagnosis Date   Anxiety    Bowel obstruction ([place])    Burning with urination 01/04/2016   Diabetes mellitus with neuropathy    Genital herpes    GERD (gastroesophageal reflux disease)    Hematuria 01/04/2016   Herpes 12/22/2015   Hyperlipidemia    Hypertension    LLQ abdominal tenderness 01/04/2016   Neuropathy    Pelvic pressure in female 01/04/2016   Recurrent UTI    Sciatic nerve disease    Small bowel obstruction ([place]) 04/27/2016   Urinary frequency 01/04/2016   Urticaria     Past Surgical History:  Procedure Laterality Date   ABDOMINAL HYSTERECTOMY     APPLICATION OF INTRAOPERATIVE CT SCAN N/A 12/19/2018    Procedure: APPLICATION OF INTRAOPERATIVE CT SCAN;  Surgeon: Erline Levine, MD;  Location: [facility];  Service: Neurosurgery;  Laterality: N/A;   bowel obstruction     COLON SURGERY     blocked colon   POSTERIOR CERVICAL FUSION/FORAMINOTOMY N/A 12/19/2018   Procedure: Cervical Seven to Thoracic One Posterior cervical fusion;  Surgeon: Erline Levine, MD;  Location: [facility];  Service: Neurosurgery;  Laterality: N/A;  Cervical Seven to Thoracic One Posterior cervical fusion    Social History   Socioeconomic History   Marital status: Widowed    Spouse name: Not on file   Number of children: Not on file   Years of education: Not on file   Highest education level: Not on file  Occupational History   Occupation: retired   Tobacco Use   Smoking status: Never   Smokeless tobacco: Current    Types: Snuff  Vaping Use   Vaping Use: Never used  Substance and Sexual Activity   Alcohol use: No   Drug use: No   Sexual activity: Not Currently    Birth control/protection: Surgical    Comment: hyst  Other Topics Concern   Not on file  Social History Narrative   Long term resident of [facility]    Social Determinants of Health   Financial Resource Strain: Not on file  Food Insecurity: Not on file  Transportation Needs: Not on file  Physical Activity: Not on file  Stress: Not on file  Social Connections: Not on file  Intimate Partner Violence: Not on file   Family History  Problem Relation Age of Onset   Diabetes Mother    Heart disease Brother    Hypertension Brother    Diabetes Brother    Hypertension Daughter    Diabetes Brother    Diabetes Brother    Alzheimer's disease Brother    Anxiety disorder Daughter       VITAL SIGNS BP (!) 153/72   Pulse 74   Temp 98.4 F (36.9 C)   Resp (!) 22   Ht 5' (1.524 m)   Wt 181 lb (82.1 kg)   SpO2 95%   BMI 35.35 kg/m   Outpatient Encounter Medications as of 07/04/2022  Medication Sig   acetaminophen (TYLENOL) 650 MG CR tablet Take 650 mg by  mouth 3 (three) times daily.   amLODipine (NORVASC) 10 MG tablet Take 1 tablet (10 mg total) by mouth daily.   aspirin 81 MG chewable tablet Chew 81 mg by mouth daily.   celecoxib (CELEBREX) 200 MG capsule Take 1 capsule by mouth 2 (two) times daily. For back pain. Give with food.   Cranberry 500 MG CAPS Take by mouth in the morning and at bedtime. For UTI prevention   cyanocobalamin 1000 MCG tablet Take 1,000 mcg by mouth daily. 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   DULoxetine (CYMBALTA) 60 MG capsule Take 60 mg by mouth daily. Pain management   HYDROcodone-acetaminophen (NORCO/VICODIN) 5-325 MG tablet Take 1 tablet by mouth every 4 (four) hours as needed.   lamoTRIgine (LAMICTAL) 25 MG tablet Take 25 mg by mouth daily.   linagliptin (TRADJENTA) 5 MG TABS tablet Take 5 mg by mouth daily.   loratadine (CLARITIN) 10 MG tablet Take 10 mg by mouth daily.   losartan (COZAAR) 25 MG tablet Take 12.5 mg by mouth daily.   Melatonin 5 MG TABS Take 5 mg by mouth at bedtime.   Menthol, Topical Analgesic, (BIOFREEZE) 4 % GEL Apply topically. Every Shift - PRN   metFORMIN (GLUCOPHAGE) 1000 MG tablet Take 1,000 mg by mouth 2 (two) times daily with a meal.    mirabegron ER (MYRBETRIQ) 50 MG TB24 tablet Take 50 mg by mouth daily.   montelukast (SINGULAIR) 10 MG tablet Take 1 tablet (10 mg total) by mouth at bedtime.   NON FORMULARY Diet: Consistent Carbohydrate   omeprazole (PRILOSEC) 20 MG capsule Take 20 mg by mouth daily. Special Instructions:  *TAKE ON AN EMPTY STOMACH* *DO NOT CRUSH*   polyethylene glycol (MIRALAX / GLYCOLAX) packet Take 17 g by mouth daily.   PRESCRIPTION MEDICATION MICROGUARD POWDER  powder; amt: SPARINGLY; topical Special Instructions: REDNESS UNDER BREAST Once A Day   PRESCRIPTION MEDICATION annusol cream  cream; amt: sparingly; rectal Special Instructions: hemmorhoids Once A  Day - PRN   senna-docusate (SENOKOT-S) 8.6-50 MG tablet Take 1 tablet by mouth at bedtime.    sertraline (ZOLOFT) 50 MG tablet Take 50 mg by mouth daily.   simvastatin (ZOCOR) 10 MG tablet Take 10 mg by mouth at bedtime.    triamcinolone (NASACORT) 55 MCG/ACT AERO nasal inhaler Place 1 spray into the nose daily.   valACYclovir (VALTREX) 500 MG tablet Take 500 mg by mouth daily.    [DISCONTINUED] HYDROcodone-acetaminophen (NORCO/VICODIN) 5-325 MG tablet Take one tab po q 4 hrs prn  pain   No facility-administered encounter medications on file as of 07/04/2022.     SIGNIFICANT DIAGNOSTIC EXAMS   PREVIOUS  07-16-20 DEXA: t score 0.825   NO NEW EXAMS.   LABS REVIEWED PREVIOUS;    07-23-21: urine culture: e-coli rocephin 08-06-21: glucose 139; bun 20; creat 0.97; k+ 3.8; na++ 137; ca 8.9; GFR 59; mag 1.9  09-30-21: wbc 9.4; hgb 11.2; hct 36.2; mcv 85.4 plt 207; glucose 153; bun 18; creat 0.88; k+ 3.6; na++ 136; ca 8.7; GFR>60 liver normal albumin 3.6; hgb a1c 7.2; chol 124; ldl 50; trig 154; hdl 43; vit B 12: 1086 03-14-26: hgb a1c 7.2 04-07-22: wbc 10.9; hgb 11.1; hct 35.0; mcv 82.4 plt 233; glucose 147; bun 18; creat 0.88; k+ 3.6; na++ 139; ca 87; gfr >60; protein 6.5; albumin 3.6 chol 134; ldl 49; trig 169; hdl 51; urine micro-albumin 26.2     NO NEW LABS  Review of Systems  Constitutional:  Negative for malaise/fatigue.  Respiratory:  Negative for cough and shortness of breath.   Cardiovascular:  Negative for chest pain, palpitations and leg swelling.  Gastrointestinal:  Negative for abdominal pain, constipation and heartburn.  Musculoskeletal:  Positive for back pain. Negative for joint pain and myalgias.  Skin: Negative.   Neurological:  Negative for dizziness.  Psychiatric/Behavioral:  The patient is not nervous/anxious.    Physical Exam Constitutional:      General: She is not in acute distress.    Appearance: She is well-developed. She is obese. She is not diaphoretic.  Neck:      Thyroid: No thyromegaly.  Cardiovascular:     Rate and Rhythm: Normal rate and regular rhythm.     Pulses: Normal pulses.     Heart sounds: Normal heart sounds.  Pulmonary:     Effort: Pulmonary effort is normal. No respiratory distress.     Breath sounds: Normal breath sounds.  Abdominal:     General: Bowel sounds are normal. There is no distension.     Palpations: Abdomen is soft.     Tenderness: There is no abdominal tenderness.  Musculoskeletal:        General: Normal range of motion.     Cervical back: Neck supple.     Right lower leg: No edema.     Left lower leg: No edema.  Lymphadenopathy:     Cervical: No cervical adenopathy.  Skin:    General: Skin is warm and dry.  Neurological:     Mental Status: She is alert and oriented to person, place, and time.  Psychiatric:        Mood and Affect: Mood normal.       ASSESSMENT/ PLAN:  TODAY  1. sciatic pain left 2. Acute left sided low back pain with sciatic pain   Will begin lyrica 25 mg nightly Will begin prednisone 10 mg daily  Will check hgb a1c on 06-27-22.    Synthia Innocent NP [facility]  call [phone]

## 2022-07-05 DIAGNOSIS — M545 Low back pain, unspecified: Secondary | ICD-10-CM | POA: Insufficient documentation

## 2022-07-07 ENCOUNTER — Other Ambulatory Visit (HOSPITAL_COMMUNITY)
Admission: RE | Admit: 2022-07-07 | Discharge: 2022-07-07 | Disposition: A | Discharge status: Home / Self Care | Payer: Medicare Other | Source: Skilled Nursing Facility | Attending: Adult Health | Admitting: Adult Health

## 2022-07-07 DIAGNOSIS — E1169 Type 2 diabetes mellitus with other specified complication: Secondary | ICD-10-CM | POA: Insufficient documentation

## 2022-07-08 ENCOUNTER — Other Ambulatory Visit: Payer: Self-pay | Admitting: Adult Health

## 2022-07-08 ENCOUNTER — Encounter (HOSPITAL_COMMUNITY)
Admission: RE | Admit: 2022-07-08 | Discharge: 2022-07-08 | Disposition: A | Discharge status: Home / Self Care | Payer: Medicare Other | Source: Skilled Nursing Facility | Attending: Adult Health | Admitting: Adult Health

## 2022-07-08 DIAGNOSIS — E1169 Type 2 diabetes mellitus with other specified complication: Secondary | ICD-10-CM | POA: Diagnosis not present

## 2022-07-08 LAB — HEMOGLOBIN A1C
Hgb A1c MFr Bld: 7.8 % — ABNORMAL HIGH (ref 4.8–5.6)
Mean Plasma Glucose: 177.16 mg/dL

## 2022-07-08 MED ORDER — PREGABALIN 25 MG PO CAPS
25.0000 mg | ORAL_CAPSULE | Freq: Every day | ORAL | 0 refills | Status: DC
Start: 2022-07-08 — End: 2022-08-05

## 2022-07-14 ENCOUNTER — Non-Acute Institutional Stay (SKILLED_NURSING_FACILITY): Payer: Medicare Other | Admitting: Internal Medicine

## 2022-07-14 ENCOUNTER — Encounter: Payer: Self-pay | Admitting: Internal Medicine

## 2022-07-14 DIAGNOSIS — E785 Hyperlipidemia, unspecified: Secondary | ICD-10-CM

## 2022-07-14 DIAGNOSIS — M5432 Sciatica, left side: Secondary | ICD-10-CM | POA: Diagnosis not present

## 2022-07-14 DIAGNOSIS — E1159 Type 2 diabetes mellitus with other circulatory complications: Secondary | ICD-10-CM | POA: Diagnosis not present

## 2022-07-14 DIAGNOSIS — E1169 Type 2 diabetes mellitus with other specified complication: Secondary | ICD-10-CM

## 2022-07-14 DIAGNOSIS — E1151 Type 2 diabetes mellitus with diabetic peripheral angiopathy without gangrene: Secondary | ICD-10-CM | POA: Diagnosis not present

## 2022-07-14 DIAGNOSIS — I152 Hypertension secondary to endocrine disorders: Secondary | ICD-10-CM

## 2022-07-14 NOTE — Assessment & Plan Note (Addendum)
-  Reviewing blood pressures recorded in the medical record; the lowest systolic is 126 with a large number of systolic blood pressures above the 150 range.  She is on amlodipine 10 and losartan 12.5.  Discussed increasing the ARB to 25 mg with NP and monitor if symptoms persist with avoidance of excess sodium intake.

## 2022-07-14 NOTE — Assessment & Plan Note (Signed)
LDL is at goal with a value of 49.  No change indicated.

## 2022-07-14 NOTE — Assessment & Plan Note (Addendum)
She states that she has been pain-free for 10 days ;presently on low dose Prednisone. No stool or urinary incontinence. Straight leg raising is negative.

## 2022-07-14 NOTE — Assessment & Plan Note (Addendum)
Current A1c is 7.8%, indicating adequate control.  No change in present regimen.  A1c goal is less than 8, ideally less than 7% as long as she is not having any hypoglycemia. Read all labels ; avoid foods & drinks with  High Fructose Corn Syrup as #1, 2 , 3 or # 4 on label.

## 2022-07-14 NOTE — Patient Instructions (Signed)
See assessment and plan under each diagnosis in the problem list and acutely for this visit 

## 2022-07-14 NOTE — Progress Notes (Signed)
NURSING HOME LOCATION:  [facility] ROOM NUMBER:  115 W  CODE STATUS: Full code  PCP:  Synthia Innocent NP  This is a nursing facility follow up visit of chronic medical diagnoses & to document compliance with Regulation 483.30 (c) in The Long Term Care Survey Manual Phase 2 which mandates caregiver visit ( visits can alternate among physician, PA or NP as per statutes) within 10 days of 30 days / 60 days/ 90 days post admission to SNF date    Interim medical record and care since last SNF visit was updated with review of diagnostic studies and change in clinical status since last visit were documented.  HPI: She is a permanent resident of this facility with medical diagnoses of essential hypertension, B12 deficiency, dyslipidemia, diabetes with neuropathy, GERD, and sciatica. She has had posterior cervical fusion and foraminotomy.  Current A1c is 7.8% indicating adequate control on present regimen.  This would correspond to an average glucose of 177.  Glucoses are monitored twice a month and the most recent FBS was 161.  Normochromic, normocytic anemia is present with H/H of 11.1/35.4.  Serially this is stable.  Other than the glucose , current chemistries are within normal limits.  LDL is at goal with a value of 49.  Review of systems: She states that she is "doing good."  She has not had sciatica for at least 10 days.  She was given a burst of steroids with response but with recurrence of the sciatica after termination of the steroids.  At this point NP prescribed low-dose prednisone, 5 mg daily, with resultant control.  She describes some blurring of her vision.  She has chronic intermittent cough which has improved.  She has numbness and tingling in her hands and feet. In reference to her blood pressure she had a large bag of salted popcorn at the bedside.  She also had high fructose corn syrup containing cola.  The serving contained 55 g of high fructose corn syrup sugar  which would correlate to 13 & 3/4 teaspoons of sugar.  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  Constitutional: No fever, significant weight change, fatigue  Eyes: No redness, discharge, pain ENT/mouth: No nasal congestion,  purulent discharge, earache, change in hearing, sore throat  Cardiovascular: No chest pain, palpitations, paroxysmal nocturnal dyspnea, claudication, edema  Respiratory: No sputum production, hemoptysis, DOE, significant snoring, apnea   Gastrointestinal: No heartburn, dysphagia, abdominal pain, nausea /vomiting, rectal bleeding, melena, change in bowels Genitourinary: No dysuria, hematuria, pyuria, incontinence, nocturia Musculoskeletal: No joint stiffness, joint swelling, weakness, pain Dermatologic: No rash, pruritus, change in appearance of skin Neurologic: No dizziness, headache, syncope, seizures Psychiatric: No significant anxiety, depression, insomnia, anorexia Endocrine: No change in hair/skin/nails, excessive thirst, excessive hunger, excessive urination  Hematologic/lymphatic: No significant bruising, lymphadenopathy, abnormal bleeding Allergy/immunology: No itchy/watery eyes, significant sneezing, urticaria, angioedema  Physical exam:  Pertinent or positive findings: She appears younger than her stated age.  She exhibits hyponasal speech.  She does have possible word retrieval issues as she could not remember the word "sciatica."  She is edentulous.  Central obesity is present.  Pedal pulses are surprisingly good.  She has bruising over the right antecubital area and over the dorsum of the left hand.  Fusiform changes of the knees are noted.  Negative straight leg raising bilaterally.  General appearance: Adequately nourished; no acute distress, increased work of breathing is present.  Lymphatic: No lymphadenopathy about the head, neck, axilla. Eyes: No conjunctival inflammation or  lid edema is present. There is no scleral icterus. Ears:  External ear exam shows no significant lesions or deformities.   Nose:  External nasal examination shows no deformity or inflammation. Nasal mucosa are pink and moist without lesions, exudates Oral exam: There is no oropharyngeal erythema or exudate. Neck:  No thyromegaly, masses, tenderness noted.    Heart:  Normal rate and regular rhythm. S1 and S2 normal without gallop, murmur, click, rub .  Lungs: Chest clear to auscultation without wheezes, rhonchi, rales, rubs. Abdomen: Bowel sounds are normal. Abdomen is soft and nontender with no organomegaly, hernias, masses. GU: Deferred  Extremities:  No cyanosis, clubbing, edema  Neurologic exam :Balance, Rhomberg, finger to nose testing could not be completed due to clinical state Skin: Warm & dry w/o tenting. No significant lesions or rash.  See summary under each active problem in the Problem List with associated updated therapeutic plan

## 2022-08-05 ENCOUNTER — Other Ambulatory Visit: Payer: Self-pay | Admitting: Adult Health

## 2022-08-05 MED ORDER — PREGABALIN 25 MG PO CAPS: 25.0000 mg | ORAL_CAPSULE | Freq: Every day | ORAL | 0 refills | Status: DC

## 2022-08-17 ENCOUNTER — Non-Acute Institutional Stay (SKILLED_NURSING_FACILITY): Payer: Medicare Other | Admitting: Adult Health

## 2022-08-17 ENCOUNTER — Encounter: Payer: Self-pay | Admitting: Adult Health

## 2022-08-17 DIAGNOSIS — E1151 Type 2 diabetes mellitus with diabetic peripheral angiopathy without gangrene: Secondary | ICD-10-CM | POA: Diagnosis not present

## 2022-08-17 DIAGNOSIS — I700 Atherosclerosis of aorta: Secondary | ICD-10-CM | POA: Diagnosis not present

## 2022-08-17 DIAGNOSIS — K219 Gastro-esophageal reflux disease without esophagitis: Secondary | ICD-10-CM

## 2022-08-17 DIAGNOSIS — N3281 Overactive bladder: Secondary | ICD-10-CM | POA: Diagnosis not present

## 2022-08-17 NOTE — Progress Notes (Signed)
Location:  [facility] Room Number: 115-W Place of Service:  SNF (31) Provider: Ok Edwards, NP  CODE STATUS: FULL CODE  Allergies  Allergen Reactions   Gabapentin    Lisinopril    Penicillins Nausea Only    Has patient had a PCN reaction causing immediate rash, facial/tongue/throat swelling, SOB or lightheadedness with hypotension: Yes Has patient had a PCN reaction causing severe rash involving mucus membranes or skin necrosis: No Has patient had a PCN reaction that required hospitalization No Has patient had a PCN reaction occurring within the last 10 years: No If all of the above answers are "NO", then may proceed with Cephalosporin use.    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 Her back and sciatic pain are currently being managed. She does get out of bed daily and does ambulate. Her appetite is good and her weight is stable.   Past Medical History:  Diagnosis Date   Anxiety    Bowel obstruction ([place])    Burning with urination 01/04/2016   Diabetes mellitus with neuropathy    Genital herpes    GERD (gastroesophageal reflux disease)    Hematuria 01/04/2016   Herpes 12/22/2015   Hyperlipidemia    Hypertension    LLQ abdominal tenderness 01/04/2016   Neuropathy    Pelvic pressure in female 01/04/2016   Recurrent UTI    Sciatic nerve disease    Small bowel obstruction ([place]) 04/27/2016   Urinary frequency 01/04/2016   Urticaria     Past Surgical  History:  Procedure Laterality Date   ABDOMINAL HYSTERECTOMY     APPLICATION OF INTRAOPERATIVE CT SCAN N/A 12/19/2018   Procedure: APPLICATION OF INTRAOPERATIVE CT SCAN;  Surgeon: Erline Levine, MD;  Location: [place];  Service: Neurosurgery;  Laterality: N/A;   bowel obstruction     COLON SURGERY     blocked colon   POSTERIOR CERVICAL FUSION/FORAMINOTOMY N/A 12/19/2018   Procedure: Cervical Seven to Thoracic One Posterior cervical fusion;  Surgeon: Erline Levine, MD;  Location: [place];  Service: Neurosurgery;  Laterality: N/A;  Cervical Seven to Thoracic One Posterior cervical fusion    Social History   Socioeconomic History   Marital status: Widowed    Spouse name: Not on file   Number of children: Not on file   Years of education: Not on file   Highest education level: Not on file  Occupational History   Occupation: retired   Tobacco Use   Smoking status: Never   Smokeless tobacco: Current    Types: Snuff  Vaping Use   Vaping Use: Never used  Substance and Sexual Activity   Alcohol use: No   Drug use: No   Sexual activity: Not Currently    Birth control/protection: Surgical    Comment: hyst  Other Topics Concern   Not on file  Social History Narrative   Long term resident of [facility]    Social Determinants  of Health   Financial Resource Strain: Not on file  Food Insecurity: Not on file  Transportation Needs: Not on file  Physical Activity: Not on file  Stress: Not on file  Social Connections: Not on file  Intimate Partner Violence: Not on file   Family History  Problem Relation Age of Onset   Diabetes Mother    Heart disease Brother    Hypertension Brother    Diabetes Brother    Hypertension Daughter    Diabetes Brother    Diabetes Brother    Alzheimer's disease Brother    Anxiety disorder Daughter       VITAL SIGNS BP 132/72   Pulse 78   Temp 97.6 F (36.4 C)   Resp (!) 22   Ht 5' (1.524 m)   Wt 181 lb (82.1 kg)   SpO2 98%   BMI 35.35 kg/m    Outpatient Encounter Medications as of 08/17/2022  Medication Sig   acetaminophen (TYLENOL) 650 MG CR tablet Take 650 mg by mouth 3 (three) times daily.   amLODipine (NORVASC) 10 MG tablet Take 1 tablet (10 mg total) by mouth daily.   aspirin 81 MG chewable tablet Chew 81 mg by mouth daily.   busPIRone (BUSPAR) 5 MG tablet Take 5 mg by mouth 3 (three) times daily.   celecoxib (CELEBREX) 200 MG capsule Take 1 capsule by mouth 2 (two) times daily. For back pain. Give with food.   Cranberry 500 MG CAPS Take by mouth in the morning and at bedtime. For UTI prevention   cyanocobalamin 1000 MCG tablet Take 1,000 mcg by mouth daily. 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   DULoxetine (CYMBALTA) 60 MG capsule Take 60 mg by mouth daily. Pain management   lamoTRIgine (LAMICTAL) 25 MG tablet Take 25 mg by mouth daily.   linagliptin (TRADJENTA) 5 MG TABS tablet Take 5 mg by mouth daily.   loratadine (CLARITIN) 10 MG tablet Take 10 mg by mouth daily.   losartan (COZAAR) 25 MG tablet Take 12.5 mg by mouth daily.   Melatonin 5 MG TABS Take 5 mg by mouth at bedtime.   Menthol, Topical Analgesic, (BIOFREEZE) 4 % GEL Apply topically. Every Shift - PRN   metFORMIN (GLUCOPHAGE) 1000 MG tablet Take 1,000 mg by mouth 2 (two) times daily with a meal.    mirabegron ER (MYRBETRIQ) 50 MG TB24 tablet Take 50 mg by mouth daily.   montelukast (SINGULAIR) 10 MG tablet Take 1 tablet (10 mg total) by mouth at bedtime.   NON FORMULARY Diet: Consistent Carbohydrate   omeprazole (PRILOSEC) 20 MG capsule Take 20 mg by mouth daily. Special Instructions:  *TAKE ON AN EMPTY STOMACH* *DO NOT CRUSH*   polyethylene glycol (MIRALAX / GLYCOLAX) packet Take 17 g by mouth daily.   predniSONE (DELTASONE) 10 MG tablet Take 10 mg by mouth daily.   pregabalin (LYRICA) 25 MG capsule Take 1 capsule (25 mg total) by mouth at bedtime.    PRESCRIPTION MEDICATION MICROGUARD POWDER  powder; amt: SPARINGLY; topical Special Instructions: REDNESS UNDER BREAST Once A Day   PRESCRIPTION MEDICATION annusol cream  cream; amt: sparingly; rectal Special Instructions: hemmorhoids Once A Day - PRN   senna-docusate (SENOKOT-S) 8.6-50 MG tablet Take 1 tablet by mouth at bedtime.    sertraline (ZOLOFT) 50 MG tablet Take 50 mg by mouth daily.   simvastatin (ZOCOR) 10 MG tablet Take 10 mg by  mouth at bedtime.    triamcinolone (NASACORT) 55 MCG/ACT AERO nasal inhaler Place 1 spray into the nose daily.   valACYclovir (VALTREX) 500 MG tablet Take 500 mg by mouth daily.    [DISCONTINUED] HYDROcodone-acetaminophen (NORCO/VICODIN) 5-325 MG tablet Take 1 tablet by mouth every 4 (four) hours as needed.   No facility-administered encounter medications on file as of 08/17/2022.     SIGNIFICANT DIAGNOSTIC EXAMS  TODAY  07-27-22: dexa t score: 0.636   LABS REVIEWED PREVIOUS;    07-23-21: urine culture: e-coli rocephin 08-06-21: glucose 139; bun 20; creat 0.97; k+ 3.8; na++ 137; ca 8.9; GFR 59; mag 1.9  09-30-21: wbc 9.4; hgb 11.2; hct 36.2; mcv 85.4 plt 207; glucose 153; bun 18; creat 0.88; k+ 3.6; na++ 136; ca 8.7; GFR>60 liver normal albumin 3.6; hgb a1c 7.2; chol 124; ldl 50; trig 154; hdl 43; vit B 12: 1086 03-14-26: hgb a1c 7.2 04-07-22: wbc 10.9; hgb 11.1; hct 35.0; mcv 82.4 plt 233; glucose 147; bun 18; creat 0.88; k+ 3.6; na++ 139; ca 87; gfr >60; protein 6.5; albumin 3.6 chol 134; ldl 49; trig 169; hdl 51; urine micro-albumin 26.2     TODAY  06-30-22: glucose 150; bun 17; creat 0.81; k+ 3.8; na++ 138; ca 9.1 gfr >60 07-02-22: wbc 10.5; hgb 11.1; hct 35.4; mcv 82.5 plt 230; glucose 138; bun 18; creat 0.98; k+ 4.2; na++ 137; ca 8.9; gfr >60;  07-08-22: hgb a1c 7.8   Review of Systems  Constitutional:  Negative for malaise/fatigue.  Respiratory:  Negative for cough and shortness of breath.   Cardiovascular:  Negative for chest pain,  palpitations and leg swelling.  Gastrointestinal:  Negative for abdominal pain, constipation and heartburn.  Musculoskeletal:  Negative for back pain, joint pain and myalgias.  Skin: Negative.   Neurological:  Negative for dizziness.  Psychiatric/Behavioral:  The patient is not nervous/anxious.     Physical Exam Constitutional:      General: She is not in acute distress.    Appearance: She is well-developed. She is obese. She is not diaphoretic.  Neck:     Thyroid: No thyromegaly.  Cardiovascular:     Rate and Rhythm: Normal rate and regular rhythm.     Pulses: Normal pulses.     Heart sounds: Normal heart sounds.  Pulmonary:     Effort: Pulmonary effort is normal. No respiratory distress.     Breath sounds: Normal breath sounds.  Abdominal:     General: Bowel sounds are normal. There is no distension.     Palpations: Abdomen is soft.     Tenderness: There is no abdominal tenderness.  Musculoskeletal:        General: Normal range of motion.     Cervical back: Neck supple.     Right lower leg: No edema.     Left lower leg: No edema.  Lymphadenopathy:     Cervical: No cervical adenopathy.  Skin:    General: Skin is warm and dry.  Neurological:     Mental Status: She is alert and oriented to person, place, and time.  Psychiatric:        Mood and Affect: Mood normal.       ASSESSMENT/ PLAN:  TODAY  Aortic atherosclerosis (ct 12-18-19) is on asa and stating  2. 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 Gastroesophageal reflux disease without esophagitis: will continue prilosec  20 mg daily   4. OAB; will continue myrbetriq 50 mg daily   PREVIOUS   5. 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 Chronic non-seasonal allergic rhinitis: will continue claritin 10 mg daily singulair 10 mg daily and nasocort daily   7. Herpes: no recent outbreaks: will continue valtrex 500 mg daily   8. Vitamin B12 deficiency: level is 1086 will continue 1,000 mcg daily   9. Chronic constipation: will continue miralax daily; senna s daily  10. Major depression recurrent chronic: will continue cymbalta 20 mg twice daily (failed one wean) lamictal 25 mg twice daily for mood and melatonin 5 mg nightly for sleep   11.  Depression recurrent: will continue zoloft 50 mg daily and buspar 5 mg three times daily   12. CKD stage 3 due to type 2 diabetes mellitus: bun 18; creat 0.98; gfr >60  13. Normocytic anemia: hgb 11.2; hct 36.2  14. Hypertension associated with type 2 diabetes mellitus: b/p 132/72 will continue norvasc 10 mg daily asa 81 mg daily cozaar 25 mg daily   15. Dyslipidemia associated with type 2 diabetes mellitus: LDL 50 will continue zocor 10 mg daily   Ok Edwards NP [facility]  call [phone]

## 2022-09-01 ENCOUNTER — Other Ambulatory Visit: Payer: Self-pay | Admitting: Adult Health

## 2022-09-01 MED ORDER — PREGABALIN 25 MG PO CAPS
25.0000 mg | ORAL_CAPSULE | Freq: Every day | ORAL | 0 refills | Status: DC
Start: 2022-09-01 — End: 2022-10-04

## 2022-09-02 ENCOUNTER — Encounter: Payer: Self-pay | Admitting: Adult Health

## 2022-09-02 ENCOUNTER — Non-Acute Institutional Stay (SKILLED_NURSING_FACILITY): Payer: Medicare Other | Admitting: Adult Health

## 2022-09-02 DIAGNOSIS — H6692 Otitis media, unspecified, left ear: Secondary | ICD-10-CM | POA: Diagnosis not present

## 2022-09-02 MED ORDER — OFLOXACIN 0.3 % OT SOLN: 5.0000 [drp] | Freq: Every day | OTIC | 0 refills | Status: AC

## 2022-09-02 NOTE — Progress Notes (Signed)
Location:  [place] Room Number: NO/115/W Place of Service:  SNF (31) Provider:  Durenda Age, DNP, FNP-BC  Patient Care Team: Gerlene Fee, NP as PCP - General ([place] ([place])  Extended Emergency Contact Information Primary Emergency Contact: Atkins,Patty Address: [address]          [place], [place] [address] Johnnette Litter of [place] Phone: [phone] Relation: Daughter Secondary Emergency Contact: GRIFFIN,TOMMY Mobile Phone: [phone] Relation: Son  Code Status:  FULL  Goals of care: Advanced Directive information    08/17/2022    9:36 AM  Advanced Directives  Does Patient Have a Medical Advance Directive? No  Does patient want to make changes to medical advance directive? No - Patient declined     Chief Complaint  Patient presents with   Acute Visit    Patient is being seen for ear pain of left ear    HPI:  Pt is a 81 y.o. female seen today for an acute visit for left ear pain. She has a PMH of recurrent UTIs, hypertension, type 2 diabetes mellitus, type 2 diabetes mellitus and chronic back pain with sciatica. She was seen in her room today. She reported that when she stuck her pinky in her left ear, she had blood on her fingernail.  She said that this has been happening for a month now. Left ear noted with dry blood in the ear canal and erythema. 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 She denies having ear pain, fever nor chills.   Past Medical History:  Diagnosis Date   Anxiety    Bowel obstruction ([place])    Burning with urination 01/04/2016   Diabetes mellitus with neuropathy    Genital herpes    GERD (gastroesophageal reflux disease)    Hematuria 01/04/2016   Herpes 12/22/2015   Hyperlipidemia    Hypertension    LLQ abdominal tenderness 01/04/2016   Neuropathy    Pelvic pressure in female 01/04/2016   Recurrent UTI    Sciatic nerve disease     Small bowel obstruction ([place]) 04/27/2016   Urinary frequency 01/04/2016   Urticaria    Past Surgical History:  Procedure Laterality Date   ABDOMINAL HYSTERECTOMY     APPLICATION OF INTRAOPERATIVE CT SCAN N/A 12/19/2018   Procedure: APPLICATION OF INTRAOPERATIVE CT SCAN;  Surgeon: Erline Levine, MD;  Location: [place];  Service: Neurosurgery;  Laterality: N/A;   bowel obstruction     COLON SURGERY     blocked colon   POSTERIOR CERVICAL FUSION/FORAMINOTOMY N/A 12/19/2018   Procedure: Cervical Seven to Thoracic One Posterior cervical fusion;  Surgeon: Erline Levine, MD;  Location: [place];  Service: Neurosurgery;  Laterality: N/A;  Cervical Seven to Thoracic One Posterior cervical fusion    Allergies  Allergen Reactions   Gabapentin    Lisinopril    Penicillins Nausea Only    Has patient had a PCN reaction causing immediate rash, facial/tongue/throat swelling, SOB or lightheadedness with hypotension: Yes Has patient had a PCN reaction causing severe rash involving mucus membranes or skin necrosis: No Has patient had a PCN reaction that required hospitalization No Has patient had a PCN reaction occurring within the last 10 years: No If all of the above answers are "NO", then may proceed with Cephalosporin use.    Sulfa Antibiotics Nausea And Vomiting    Outpatient Encounter Medications as of 09/02/2022  Medication Sig   acetaminophen (TYLENOL) 650 MG CR tablet  Take 650 mg by mouth 3 (three) times daily.   amLODipine (NORVASC) 10 MG tablet Take 1 tablet (10 mg total) by mouth daily.   aspirin 81 MG chewable tablet Chew 81 mg by mouth daily.   busPIRone (BUSPAR) 5 MG tablet Take 5 mg by mouth 3 (three) times daily.   celecoxib (CELEBREX) 200 MG capsule Take 1 capsule by mouth 2 (two) times daily. For back pain. Give with food.   Cranberry 500 MG CAPS Take by mouth in the morning and at bedtime. For UTI prevention   cyanocobalamin 1000 MCG tablet Take 1,000 mcg by mouth daily. Special  Instructions: for vit B 12 deficiency   Dextromethorphan-guaiFENesin 10-100 MG/5ML liquid Take 5 mLs by mouth at bedtime as needed.   DULoxetine (CYMBALTA) 60 MG capsule Take 60 mg by mouth daily. Pain management   lamoTRIgine (LAMICTAL) 25 MG tablet Take 25 mg by mouth daily.   linagliptin (TRADJENTA) 5 MG TABS tablet Take 5 mg by mouth daily.   loratadine (CLARITIN) 10 MG tablet Take 10 mg by mouth daily.   losartan (COZAAR) 25 MG tablet Take 12.5 mg by mouth daily.   Melatonin 5 MG TABS Take 5 mg by mouth at bedtime.   Menthol, Topical Analgesic, (BIOFREEZE) 4 % GEL Apply topically. Every Shift - PRN   metFORMIN (GLUCOPHAGE) 1000 MG tablet Take 1,000 mg by mouth 2 (two) times daily with a meal.    mirabegron ER (MYRBETRIQ) 50 MG TB24 tablet Take 50 mg by mouth daily.   montelukast (SINGULAIR) 10 MG tablet Take 1 tablet (10 mg total) by mouth at bedtime.   NON FORMULARY Diet: Consistent Carbohydrate   omeprazole (PRILOSEC) 20 MG capsule Take 20 mg by mouth daily. Special Instructions:  *TAKE ON AN EMPTY STOMACH* *DO NOT CRUSH*   polyethylene glycol (MIRALAX / GLYCOLAX) packet Take 17 g by mouth daily.   predniSONE (DELTASONE) 10 MG tablet Take 10 mg by mouth daily.   pregabalin (LYRICA) 25 MG capsule Take 1 capsule (25 mg total) by mouth at bedtime.   PRESCRIPTION MEDICATION MICROGUARD POWDER  powder; amt: SPARINGLY; topical Special Instructions: REDNESS UNDER BREAST Once A Day   PRESCRIPTION MEDICATION annusol cream  cream; amt: sparingly; rectal Special Instructions: hemmorhoids Once A Day - PRN   senna-docusate (SENOKOT-S) 8.6-50 MG tablet Take 1 tablet by mouth at bedtime.    sertraline (ZOLOFT) 50 MG tablet Take 50 mg by mouth daily.   simvastatin (ZOCOR) 10 MG tablet Take 10 mg by mouth at bedtime.    triamcinolone (NASACORT) 55 MCG/ACT AERO nasal inhaler Place 1 spray into the nose daily.   valACYclovir (VALTREX) 500 MG tablet Take 500 mg by mouth daily.    No  facility-administered encounter medications on file as of 09/02/2022.    Review of Systems  Constitutional:  Negative for appetite change, chills, fatigue and fever.  HENT:  Positive for hearing loss. Negative for congestion, ear pain, rhinorrhea and sore throat.        Blood from ears  Eyes: Negative.   Respiratory:  Negative for cough, shortness of breath and wheezing.   Cardiovascular:  Negative for chest pain, palpitations and leg swelling.  Gastrointestinal:  Negative for abdominal pain, constipation, diarrhea, nausea and vomiting.  Genitourinary:  Negative for dysuria.  Musculoskeletal:  Negative for arthralgias, back pain and myalgias.  Skin:  Negative for color change, rash and wound.  Neurological:  Negative for dizziness, weakness and headaches.  Psychiatric/Behavioral:  Negative for behavioral problems. The  patient is not nervous/anxious.       Immunization History  Administered Date(s) Administered   Influenza,inj,Quad PF,6+ Mos 09/15/2021   Influenza-Unspecified 09/16/2019, 09/18/2020   Moderna Covid-19 Vaccine Bivalent Booster 13yrs & up 10/05/2021   Moderna SARS-COV2 Booster Vaccination 01/15/2021, 07/28/2021   Moderna Sars-Covid-2 Vaccination 05/08/2020, 06/04/2020   PNEUMOCOCCAL CONJUGATE-20 07/06/2021   Tdap 10/13/2021   Zoster Recombinat (Shingrix) 08/10/2021, 11/12/2021   Pertinent  Health Maintenance Due  Topic Date Due   INFLUENZA VACCINE  07/12/2022   FOOT EXAM  12/12/2022 (Originally 12/29/2021)   OPHTHALMOLOGY EXAM  11/17/2022   HEMOGLOBIN A1C  01/08/2023   DEXA SCAN  Completed      03/08/2021   12:06 PM 12/22/2021    1:32 PM 03/16/2022    1:51 PM 06/09/2022    4:25 PM 07/02/2022    1:45 PM  Fall Risk  Falls in the past year? 0 0 0    Was there an injury with Fall? 0 0 0    Fall Risk Category Calculator 0 0 0    Fall Risk Category Low Low Low    Patient Fall Risk Level Low fall risk Low fall risk Low fall risk Low fall risk Low fall risk  Patient  at Risk for Falls Due to Impaired balance/gait;Impaired mobility  Impaired balance/gait;Impaired mobility    Fall risk Follow up Falls evaluation completed         Vitals:   09/02/22 1116  BP: (!) 140/72  Pulse: 74  Resp: 20  Temp: 98.6 F (37 C)  SpO2: 95%  Weight: 153 lb (69.4 kg)  Height: 5' (1.524 m)   Body mass index is 29.88 kg/m.  Physical Exam Constitutional:      Appearance: Normal appearance.  HENT:     Head: Normocephalic and atraumatic.     Comments: Left ear canal with erythema and dry blood    Nose: Nose normal.     Mouth/Throat:     Mouth: Mucous membranes are moist.  Eyes:     Conjunctiva/sclera: Conjunctivae normal.  Cardiovascular:     Rate and Rhythm: Normal rate and regular rhythm.  Pulmonary:     Effort: Pulmonary effort is normal.     Breath sounds: Normal breath sounds.  Abdominal:     General: Bowel sounds are normal.     Palpations: Abdomen is soft.  Musculoskeletal:        General: Normal range of motion.     Cervical back: Normal range of motion.  Skin:    General: Skin is warm and dry.  Neurological:     General: No focal deficit present.     Mental Status: She is alert and oriented to person, place, and time.  Psychiatric:        Mood and Affect: Mood normal.        Behavior: Behavior normal.        Thought Content: Thought content normal.        Judgment: Judgment normal.        Labs reviewed: Recent Labs    04/07/22 0435 06/30/22 0846 07/02/22 1454  NA 139 138 137  K 3.6 3.8 4.2  CL 100 100 103  CO2 28 28 22   GLUCOSE 147* 150* 138*  BUN 15 17 18   CREATININE 0.80 0.81 0.89  CALCIUM 9.2 9.1 8.9   Recent Labs    09/30/21 0400 04/07/22 0435  AST 15 15  ALT 17 15  ALKPHOS 70 83  BILITOT 0.2* 0.3  PROT 6.5 7.3  ALBUMIN 3.6 4.1   Recent Labs    09/30/21 0400 04/07/22 0435 07/02/22 1527  WBC 9.4 10.9* 10.5  NEUTROABS  --   --  7.1  HGB 11.2* 11.1* 11.1*  HCT 36.2 35.0* 35.4*  MCV 85.4 82.4 82.5  PLT 207  223 230   Lab Results  Component Value Date   TSH 4.543 (H) 10/01/2020   Lab Results  Component Value Date   HGBA1C 7.8 (H) 07/08/2022   Lab Results  Component Value Date   CHOL 134 04/07/2022   HDL 51 04/07/2022   LDLCALC 49 04/07/2022   TRIG 169 (H) 04/07/2022   CHOLHDL 2.6 04/07/2022    Significant Diagnostic Results in last 30 days:  No results found.  Assessment/Plan  1. Left otitis media, unspecified otitis media type -   has erythema and dry blood in left ear canal - ofloxacin (FLOXIN OTIC) 0.3 % OTIC solution; Place 5 drops into the left ear daily for 7 days.  Dispense: 1.8 mL; Refill: 0    Family/ staff Communication: Discussed plan of care with resident and charge nurse  Labs/tests ordered:  None    Durenda Age, DNP, MSN, FNP-BC [facility] [phone] (Monday-Friday 8:00 a.m. - 5:00 p.m.) [phone] (after hours)

## 2022-09-13 DIAGNOSIS — Z23 Encounter for immunization: Secondary | ICD-10-CM | POA: Diagnosis not present

## 2022-09-16 ENCOUNTER — Encounter: Payer: Self-pay | Admitting: Adult Health

## 2022-09-16 ENCOUNTER — Non-Acute Institutional Stay (SKILLED_NURSING_FACILITY): Payer: Medicare Other | Admitting: Adult Health

## 2022-09-16 DIAGNOSIS — E1159 Type 2 diabetes mellitus with other circulatory complications: Secondary | ICD-10-CM | POA: Diagnosis not present

## 2022-09-16 DIAGNOSIS — I700 Atherosclerosis of aorta: Secondary | ICD-10-CM

## 2022-09-16 DIAGNOSIS — E1122 Type 2 diabetes mellitus with diabetic chronic kidney disease: Secondary | ICD-10-CM

## 2022-09-16 DIAGNOSIS — I152 Hypertension secondary to endocrine disorders: Secondary | ICD-10-CM

## 2022-09-16 DIAGNOSIS — N183 Chronic kidney disease, stage 3 unspecified: Secondary | ICD-10-CM | POA: Diagnosis not present

## 2022-09-16 NOTE — Progress Notes (Signed)
Location:  [facility] Room Number: 115/W Place of Service:  SNF (31)   CODE STATUS: full   Allergies  Allergen Reactions   Gabapentin    Lisinopril    Penicillins Nausea Only    Has patient had a PCN reaction causing immediate rash, facial/tongue/throat swelling, SOB or lightheadedness with hypotension: Yes Has patient had a PCN reaction causing severe rash involving mucus membranes or skin necrosis: No Has patient had a PCN reaction that required hospitalization No Has patient had a PCN reaction occurring within the last 10 years: No If all of the above answers are "NO", then may proceed with Cephalosporin use.    Sulfa Antibiotics Nausea And Vomiting    Chief Complaint  Patient presents with   Acute Visit    Care plan meeting    HPI:  We have come together for her care plan meeting. BIMS 15/15 mood 0/30. She is ambulatory with a walker with no falls. She is independent to supervision with her adl care. She is continent of bladder and bowel. Dietary: CON CHO diet weight is 181.4 pounds appetite 75-100% feeds self. Therapy: none at this time. Activities: she does participate in the afternoons. 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  Past Medical History:  Diagnosis Date   Anxiety    Bowel obstruction (HCC)    Burning with urination 01/04/2016   Diabetes mellitus with neuropathy    Genital herpes    GERD (gastroesophageal reflux disease)    Hematuria 01/04/2016   Herpes 12/22/2015   Hyperlipidemia    Hypertension    LLQ abdominal tenderness 01/04/2016   Neuropathy    Pelvic pressure in female 01/04/2016   Recurrent UTI    Sciatic nerve disease    Small bowel obstruction (HCC) 04/27/2016   Urinary frequency 01/04/2016   Urticaria     Past Surgical History:  Procedure Laterality Date    ABDOMINAL HYSTERECTOMY     APPLICATION OF INTRAOPERATIVE CT SCAN N/A 12/19/2018   Procedure: APPLICATION OF INTRAOPERATIVE CT SCAN;  Surgeon: Maeola Harman, MD;  Location: [facility] OR;  Service: Neurosurgery;  Laterality: N/A;   bowel obstruction     COLON SURGERY     blocked colon   POSTERIOR CERVICAL FUSION/FORAMINOTOMY N/A 12/19/2018   Procedure: Cervical Seven to Thoracic One Posterior cervical fusion;  Surgeon: Maeola Harman, MD;  Location: [facility] OR;  Service: Neurosurgery;  Laterality: N/A;  Cervical Seven to Thoracic One Posterior cervical fusion    Social History   Socioeconomic History   Marital status: Widowed    Spouse name: Not on file   Number of children: Not on file   Years of education: Not on file   Highest education level: Not on file  Occupational History   Occupation: retired   Tobacco Use   Smoking status: Never   Smokeless tobacco: Current    Types: Snuff  Vaping Use   Vaping Use: Never used  Substance and Sexual Activity   Alcohol use: No   Drug use: No   Sexual activity: Not Currently    Birth control/protection: Surgical    Comment: hyst  Other Topics Concern   Not on file  Social History Narrative   Long term resident of [facility]    Social Determinants of Health   Financial Resource Strain: Not on file  Food Insecurity: Not on  file  Transportation Needs: Not on file  Physical Activity: Not on file  Stress: Not on file  Social Connections: Not on file  Intimate Partner Violence: Not on file   Family History  Problem Relation Age of Onset   Diabetes Mother    Heart disease Brother    Hypertension Brother    Diabetes Brother    Hypertension Daughter    Diabetes Brother    Diabetes Brother    Alzheimer's disease Brother    Anxiety disorder Daughter       VITAL SIGNS BP 132/79   Pulse 88   Temp (!) 97.4 F (36.3 C)   Resp (!) 22   Ht 5' (1.524 m)   Wt 153 lb (69.4 kg)   SpO2 98%   BMI 29.88 kg/m   Outpatient Encounter Medications as of  09/16/2022  Medication Sig   acetaminophen (TYLENOL) 650 MG CR tablet Take 650 mg by mouth 3 (three) times daily.   amLODipine (NORVASC) 10 MG tablet Take 1 tablet (10 mg total) by mouth daily.   aspirin 81 MG chewable tablet Chew 81 mg by mouth daily.   busPIRone (BUSPAR) 5 MG tablet Take 5 mg by mouth 3 (three) times daily.   celecoxib (CELEBREX) 200 MG capsule Take 1 capsule by mouth 2 (two) times daily. For back pain. Give with food.   Cranberry 500 MG CAPS Take by mouth in the morning and at bedtime. For UTI prevention   cyanocobalamin 1000 MCG tablet Take 1,000 mcg by mouth daily. 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   DULoxetine (CYMBALTA) 60 MG capsule Take 60 mg by mouth daily. Pain management   lamoTRIgine (LAMICTAL) 25 MG tablet Take 25 mg by mouth daily.   linagliptin (TRADJENTA) 5 MG TABS tablet Take 5 mg by mouth daily.   loratadine (CLARITIN) 10 MG tablet Take 10 mg by mouth daily.   losartan (COZAAR) 25 MG tablet Take 12.5 mg by mouth daily.   Melatonin 5 MG TABS Take 5 mg by mouth at bedtime.   Menthol, Topical Analgesic, (BIOFREEZE) 4 % GEL Apply topically. Every Shift - PRN   metFORMIN (GLUCOPHAGE) 1000 MG tablet Take 1,000 mg by mouth 2 (two) times daily with a meal.    mirabegron ER (MYRBETRIQ) 50 MG TB24 tablet Take 50 mg by mouth daily.   montelukast (SINGULAIR) 10 MG tablet Take 1 tablet (10 mg total) by mouth at bedtime.   NON FORMULARY Diet: Consistent Carbohydrate   omeprazole (PRILOSEC) 20 MG capsule Take 20 mg by mouth daily. Special Instructions:  *TAKE ON AN EMPTY STOMACH* *DO NOT CRUSH*   polyethylene glycol (MIRALAX / GLYCOLAX) packet Take 17 g by mouth daily.   predniSONE (DELTASONE) 10 MG tablet Take 10 mg by mouth daily.   pregabalin (LYRICA) 25 MG capsule Take 1 capsule (25 mg total) by mouth at bedtime.   PRESCRIPTION MEDICATION MICROGUARD POWDER   powder; amt: SPARINGLY; topical Special Instructions: REDNESS UNDER BREAST Once A Day   PRESCRIPTION MEDICATION annusol cream  cream; amt: sparingly; rectal Special Instructions: hemmorhoids Once A Day - PRN   senna-docusate (SENOKOT-S) 8.6-50 MG tablet Take 1 tablet by mouth at bedtime.    sertraline (ZOLOFT) 50 MG tablet Take 50 mg by mouth daily.   simvastatin (ZOCOR) 10 MG tablet Take 10 mg by mouth at bedtime.    triamcinolone (NASACORT) 55 MCG/ACT AERO nasal inhaler Place  1 spray into the nose daily.   valACYclovir (VALTREX) 500 MG tablet Take 500 mg by mouth daily.    No facility-administered encounter medications on file as of 09/16/2022.     SIGNIFICANT DIAGNOSTIC EXAMS  TODAY  07-27-22: dexa t score: 0.636   LABS REVIEWED PREVIOUS;    09-30-21: wbc 9.4; hgb 11.2; hct 36.2; mcv 85.4 plt 207; glucose 153; bun 18; creat 0.88; k+ 3.6; na++ 136; ca 8.7; GFR>60 liver normal albumin 3.6; hgb a1c 7.2; chol 124; ldl 50; trig 154; hdl 43; vit B 12: 1086 03-14-26: hgb a1c 7.2 04-07-22: wbc 10.9; hgb 11.1; hct 35.0; mcv 82.4 plt 233; glucose 147; bun 18; creat 0.88; k+ 3.6; na++ 139; ca 87; gfr >60; protein 6.5; albumin 3.6 chol 134; ldl 49; trig 169; hdl 51; urine micro-albumin 26.2    06-30-22: glucose 150; bun 17; creat 0.81; k+ 3.8; na++ 138; ca 9.1 gfr >60 07-02-22: wbc 10.5; hgb 11.1; hct 35.4; mcv 82.5 plt 230; glucose 138; bun 18; creat 0.98; k+ 4.2; na++ 137; ca 8.9; gfr >60;  07-08-22: hgb a1c 7.8   NO NEW LABS.    Review of Systems  Constitutional:  Negative for malaise/fatigue.  Respiratory:  Negative for cough and shortness of breath.   Cardiovascular:  Negative for chest pain, palpitations and leg swelling.  Gastrointestinal:  Negative for abdominal pain, constipation and heartburn.  Musculoskeletal:  Negative for back pain, joint pain and myalgias.  Skin: Negative.   Neurological:  Negative for dizziness.  Psychiatric/Behavioral:  The patient is not nervous/anxious.      Physical Exam Constitutional:      General: She is not in acute distress.    Appearance: She is well-developed. She is not diaphoretic.  Neck:     Thyroid: No thyromegaly.  Cardiovascular:     Rate and Rhythm: Normal rate and regular rhythm.     Pulses: Normal pulses.     Heart sounds: Normal heart sounds.  Pulmonary:     Effort: Pulmonary effort is normal. No respiratory distress.     Breath sounds: Normal breath sounds.  Abdominal:     General: Bowel sounds are normal. There is no distension.     Palpations: Abdomen is soft.     Tenderness: There is no abdominal tenderness.  Musculoskeletal:        General: Normal range of motion.     Cervical back: Neck supple.     Right lower leg: No edema.     Left lower leg: No edema.  Lymphadenopathy:     Cervical: No cervical adenopathy.  Skin:    General: Skin is warm and dry.  Neurological:     Mental Status: She is alert and oriented to person, place, and time.  Psychiatric:        Mood and Affect: Mood normal.       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   Time spent with patient: 40 minutes: medications; plan of care dietary.    Ok Edwards NP [facility]  call [phone]

## 2022-09-19 DIAGNOSIS — Z1159 Encounter for screening for other viral diseases: Secondary | ICD-10-CM | POA: Diagnosis not present

## 2022-09-19 DIAGNOSIS — F0390 Unspecified dementia without behavioral disturbance: Secondary | ICD-10-CM | POA: Diagnosis not present

## 2022-09-22 ENCOUNTER — Encounter: Payer: Self-pay | Admitting: Adult Health

## 2022-09-22 ENCOUNTER — Non-Acute Institutional Stay (SKILLED_NURSING_FACILITY): Payer: Medicare Other | Admitting: Adult Health

## 2022-09-22 DIAGNOSIS — G8929 Other chronic pain: Secondary | ICD-10-CM

## 2022-09-22 DIAGNOSIS — M5432 Sciatica, left side: Secondary | ICD-10-CM

## 2022-09-22 DIAGNOSIS — R52 Pain, unspecified: Secondary | ICD-10-CM | POA: Diagnosis not present

## 2022-09-22 DIAGNOSIS — B009 Herpesviral infection, unspecified: Secondary | ICD-10-CM

## 2022-09-22 DIAGNOSIS — E538 Deficiency of other specified B group vitamins: Secondary | ICD-10-CM

## 2022-09-22 DIAGNOSIS — J3089 Other allergic rhinitis: Secondary | ICD-10-CM

## 2022-09-22 NOTE — Progress Notes (Signed)
Location:  [place] Room Number: 115-W Place of Service:  SNF (31)   CODE STATUS: Full Code   Allergies  Allergen Reactions   Gabapentin    Lisinopril    Penicillins Nausea Only    Has patient had a PCN reaction causing immediate rash, facial/tongue/throat swelling, SOB or lightheadedness with hypotension: Yes Has patient had a PCN reaction causing severe rash involving mucus membranes or skin necrosis: No Has patient had a PCN reaction that required hospitalization No Has patient had a PCN reaction occurring within the last 10 years: No If all of the above answers are "NO", then may proceed with Cephalosporin use.    Sulfa Antibiotics Nausea And Vomiting    Chief Complaint  Patient presents with   Medical Management of Chronic Issues                                Chronic generalized pain/left sciatic pain:  Chronic non-seasonal allergic rhinitis:  Herpes: Vitamin B 12 deficiency    HPI:  She is a 81 year old long term resident of this facility being seen for the management of her chronic illnesses: Chronic generalized pain/left sciatic pain:  Chronic non-seasonal allergic rhinitis:  Herpes: Vitamin B 12 deficiency. She denies any uncontrolled pain. Her weight is stable she has a good appetite.   Past Medical History:  Diagnosis Date   Anxiety    Bowel obstruction ([place])    Burning with urination 01/04/2016   Diabetes mellitus with neuropathy    Genital herpes    GERD (gastroesophageal reflux disease)    Hematuria 01/04/2016   Herpes 12/22/2015   Hyperlipidemia    Hypertension    LLQ abdominal tenderness 01/04/2016   Neuropathy    Pelvic pressure in female 01/04/2016   Recurrent UTI    Sciatic nerve disease    Small bowel obstruction ([place]) 04/27/2016   Urinary frequency 01/04/2016   Urticaria     Past Surgical History:  Procedure Laterality Date   ABDOMINAL HYSTERECTOMY     APPLICATION OF INTRAOPERATIVE CT SCAN N/A 12/19/2018    Procedure: APPLICATION OF INTRAOPERATIVE CT SCAN;  Surgeon: Erline Levine, MD;  Location: [place];  Service: Neurosurgery;  Laterality: N/A;   bowel obstruction     COLON SURGERY     blocked colon   POSTERIOR CERVICAL FUSION/FORAMINOTOMY N/A 12/19/2018   Procedure: Cervical Seven to Thoracic One Posterior cervical fusion;  Surgeon: Erline Levine, MD;  Location: [place];  Service: Neurosurgery;  Laterality: N/A;  Cervical Seven to Thoracic One Posterior cervical fusion    Social History   Socioeconomic History   Marital status: Widowed    Spouse name: Not on file   Number of children: Not on file   Years of education: Not on file   Highest education level: Not on file  Occupational History   Occupation: retired   Tobacco Use   Smoking status: Never   Smokeless tobacco: Current    Types: Snuff  Vaping Use   Vaping Use: Never used  Substance and Sexual Activity   Alcohol use: No   Drug use: No   Sexual activity: Not Currently    Birth control/protection: Surgical    Comment: hyst  Other Topics Concern   Not on file  Social History Narrative   Long term resident of [facility]    Social Determinants of Health   Financial Resource Strain: Not on file  Food Insecurity: Not on file  Transportation Needs: Not on file  Physical Activity: Not on file  Stress: Not on file  Social Connections: Not on file  Intimate Partner Violence: Not on file   Family History  Problem Relation Age of Onset   Diabetes Mother    Heart disease Brother    Hypertension Brother    Diabetes Brother    Hypertension Daughter    Diabetes Brother    Diabetes Brother    Alzheimer's disease Brother    Anxiety disorder Daughter       VITAL SIGNS BP (!) 145/87   Pulse 88   Temp (!) 97.5 F (36.4 C)   Resp 18   Ht 5' (1.524 m)   Wt 181 lb 6.4 oz (82.3 kg)   SpO2 95%   BMI 35.43 kg/m   Outpatient Encounter Medications as of 09/22/2022  Medication Sig   acetaminophen (TYLENOL) 650 MG CR tablet Take  650 mg by mouth 3 (three) times daily.   amLODipine (NORVASC) 10 MG tablet Take 1 tablet (10 mg total) by mouth daily.   aspirin 81 MG chewable tablet Chew 81 mg by mouth daily.   busPIRone (BUSPAR) 5 MG tablet Take 5 mg by mouth 3 (three) times daily.   celecoxib (CELEBREX) 200 MG capsule Take 1 capsule by mouth 2 (two) times daily. For back pain. Give with food.   Cranberry 500 MG CAPS Take by mouth in the morning and at bedtime. For UTI prevention   cyanocobalamin 1000 MCG tablet Take 1,000 mcg by mouth daily. 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   DULoxetine (CYMBALTA) 60 MG capsule Take 60 mg by mouth daily. Pain management   lamoTRIgine (LAMICTAL) 25 MG tablet Take 25 mg by mouth daily.   linagliptin (TRADJENTA) 5 MG TABS tablet Take 5 mg by mouth daily.   loratadine (CLARITIN) 10 MG tablet Take 10 mg by mouth daily.   losartan (COZAAR) 25 MG tablet Take 12.5 mg by mouth daily.   Melatonin 5 MG TABS Take 5 mg by mouth at bedtime.   Menthol, Topical Analgesic, (BIOFREEZE) 4 % GEL Apply topically. Every Shift - PRN   metFORMIN (GLUCOPHAGE) 1000 MG tablet Take 1,000 mg by mouth 2 (two) times daily with a meal.    mirabegron ER (MYRBETRIQ) 50 MG TB24 tablet Take 50 mg by mouth daily.   montelukast (SINGULAIR) 10 MG tablet Take 1 tablet (10 mg total) by mouth at bedtime.   NON FORMULARY Diet: Consistent Carbohydrate   omeprazole (PRILOSEC) 20 MG capsule Take 20 mg by mouth daily. Special Instructions:  *TAKE ON AN EMPTY STOMACH* *DO NOT CRUSH*   polyethylene glycol (MIRALAX / GLYCOLAX) packet Take 17 g by mouth daily.   predniSONE (DELTASONE) 10 MG tablet Take 10 mg by mouth daily.   pregabalin (LYRICA) 25 MG capsule Take 1 capsule (25 mg total) by mouth at bedtime.   PRESCRIPTION MEDICATION MICROGUARD POWDER  powder; amt: SPARINGLY; topical Special Instructions: REDNESS UNDER BREAST Once  A Day   PRESCRIPTION MEDICATION annusol cream  cream; amt: sparingly; rectal Special Instructions: hemmorhoids Once A Day - PRN   senna-docusate (SENOKOT-S) 8.6-50 MG tablet Take 1 tablet by mouth at bedtime.    sertraline (ZOLOFT) 50 MG tablet Take 50 mg by mouth daily.   simvastatin (ZOCOR) 10 MG tablet Take 10 mg by mouth at bedtime.    triamcinolone (NASACORT)  55 MCG/ACT AERO nasal inhaler Place 1 spray into the nose daily.   valACYclovir (VALTREX) 500 MG tablet Take 500 mg by mouth daily.    No facility-administered encounter medications on file as of 09/22/2022.     SIGNIFICANT DIAGNOSTIC EXAMS  PREVIOUS   07-27-22: dexa t score: 0.636  NO NEW EXAMS    LABS REVIEWED PREVIOUS;    09-30-21: wbc 9.4; hgb 11.2; hct 36.2; mcv 85.4 plt 207; glucose 153; bun 18; creat 0.88; k+ 3.6; na++ 136; ca 8.7; GFR>60 liver normal albumin 3.6; hgb a1c 7.2; chol 124; ldl 50; trig 154; hdl 43; vit B 12: 1086 03-14-26: hgb a1c 7.2 04-07-22: wbc 10.9; hgb 11.1; hct 35.0; mcv 82.4 plt 233; glucose 147; bun 18; creat 0.88; k+ 3.6; na++ 139; ca 87; gfr >60; protein 6.5; albumin 3.6 chol 134; ldl 49; trig 169; hdl 51; urine micro-albumin 26.2    06-30-22: glucose 150; bun 17; creat 0.81; k+ 3.8; na++ 138; ca 9.1 gfr >60 07-02-22: wbc 10.5; hgb 11.1; hct 35.4; mcv 82.5 plt 230; glucose 138; bun 18; creat 0.98; k+ 4.2; na++ 137; ca 8.9; gfr >60;  07-08-22: hgb a1c 7.8   NO NEW LABS.   Review of Systems  Constitutional:  Negative for malaise/fatigue.  Respiratory:  Negative for cough and shortness of breath.   Cardiovascular:  Negative for chest pain, palpitations and leg swelling.  Gastrointestinal:  Negative for abdominal pain, constipation and heartburn.  Musculoskeletal:  Negative for back pain, joint pain and myalgias.  Skin: Negative.   Neurological:  Negative for dizziness.  Psychiatric/Behavioral:  The patient is not nervous/anxious.       Physical Exam Constitutional:      General: She is  not in acute distress.    Appearance: She is well-developed. She is obese. She is not diaphoretic.  Neck:     Thyroid: No thyromegaly.  Cardiovascular:     Rate and Rhythm: Normal rate and regular rhythm.     Pulses: Normal pulses.     Heart sounds: Normal heart sounds.  Pulmonary:     Effort: Pulmonary effort is normal. No respiratory distress.     Breath sounds: Normal breath sounds.  Abdominal:     General: Bowel sounds are normal. There is no distension.     Palpations: Abdomen is soft.     Tenderness: There is no abdominal tenderness.  Musculoskeletal:        General: Normal range of motion.     Cervical back: Neck supple.     Right lower leg: No edema.     Left lower leg: No edema.  Lymphadenopathy:     Cervical: No cervical adenopathy.  Skin:    General: Skin is warm and dry.  Neurological:     Mental Status: She is alert and oriented to person, place, and time.  Psychiatric:        Mood and Affect: Mood normal.       ASSESSMENT/ PLAN:  TODAY  Chronic generalized pain/left sciatic pain: will continue tylenol 650 mg three times daily celebrex 200 mg daily lyrica 25 mg nightly cymbalta 60 mg daily will lower prednisone to 5 mg through 09-30-22.   2. Chronic non-seasonal allergic rhinitis: will continue claritin  10 mg daily and singulair 10 mg daily and nasocort daily   3. Herpes: no recent outbreaks will continue valtrex 500 mg daily   4. Vitamin B 12 deficiency level is 1068 will continue 1000 mcg daily   PREVIOUS  5. Chronic constipation: will continue miralax daily; senna s daily  6. Major depression recurrent chronic: will continue cymbalta 20 mg twice daily (failed one wean) lamictal 25 mg twice daily for mood and melatonin 5 mg nightly for sleep   7.  Depression recurrent: will continue zoloft 50 mg daily and buspar 5 mg three times daily   8. CKD stage 3 due to type 2 diabetes mellitus: bun 18; creat 0.98; gfr >60  9. Normocytic anemia: hgb 11.2;  hct 36.2  10. Hypertension associated with type 2 diabetes mellitus: b/p 132/72 will continue norvasc 10 mg daily asa 81 mg daily cozaar 25 mg daily   11. Dyslipidemia associated with type 2 diabetes mellitus: LDL 50 will continue zocor 10 mg daily   12. Aortic atherosclerosis (ct 12-18-19) is on asa and stating  13 . Type 2 diabetes mellitus with peripheral angiopathy: hgb a1c 7.8; will continue metformin 1 gm twice daily; tradjenta 5 mg daily is on arb; asa statin  14. Gastroesophageal reflux disease without esophagitis: will continue prilosec 20 mg daily   15. OAB; will continue myrbetriq 50 mg daily     Ok Edwards NP [facility]   call [phone]

## 2022-09-26 ENCOUNTER — Other Ambulatory Visit (HOSPITAL_COMMUNITY)
Admission: RE | Admit: 2022-09-26 | Discharge: 2022-09-26 | Disposition: A | Discharge status: Home / Self Care | Payer: Medicare Other | Source: Skilled Nursing Facility | Attending: Adult Health | Admitting: Adult Health

## 2022-09-26 DIAGNOSIS — E1142 Type 2 diabetes mellitus with diabetic polyneuropathy: Secondary | ICD-10-CM | POA: Diagnosis not present

## 2022-09-26 LAB — HEMOGLOBIN A1C
Hgb A1c MFr Bld: 8 % — ABNORMAL HIGH (ref 4.8–5.6)
Mean Plasma Glucose: 182.9 mg/dL

## 2022-09-27 ENCOUNTER — Non-Acute Institutional Stay (SKILLED_NURSING_FACILITY): Payer: Medicare Other | Admitting: Adult Health

## 2022-09-27 ENCOUNTER — Encounter: Payer: Self-pay | Admitting: Adult Health

## 2022-09-27 DIAGNOSIS — E1151 Type 2 diabetes mellitus with diabetic peripheral angiopathy without gangrene: Secondary | ICD-10-CM

## 2022-09-27 NOTE — Progress Notes (Signed)
Location:  [place] Room Number: 115 Place of Service:  SNF (31)   CODE STATUS: full   Allergies  Allergen Reactions   Gabapentin    Lisinopril    Penicillins Nausea Only    Has patient had a PCN reaction causing immediate rash, facial/tongue/throat swelling, SOB or lightheadedness with hypotension: Yes Has patient had a PCN reaction causing severe rash involving mucus membranes or skin necrosis: No Has patient had a PCN reaction that required hospitalization No Has patient had a PCN reaction occurring within the last 10 years: No If all of the above answers are "NO", then may proceed with Cephalosporin use.    Sulfa Antibiotics Nausea And Vomiting    Chief Complaint  Patient presents with   Acute Visit    Follow up diabetes     HPI:  Her hgb a1c is 8.0. most of her cbg readings are elevated in the upper 100's. There are no reports of excessive thirst or hunger. She is presently taking metformin and tradjenta.   Past Medical History:  Diagnosis Date   Anxiety    Bowel obstruction ([place])    Burning with urination 01/04/2016   Diabetes mellitus with neuropathy    Genital herpes    GERD (gastroesophageal reflux disease)    Hematuria 01/04/2016   Herpes 12/22/2015   Hyperlipidemia    Hypertension    LLQ abdominal tenderness 01/04/2016   Neuropathy    Pelvic pressure in female 01/04/2016   Recurrent UTI    Sciatic nerve disease    Small bowel obstruction ([place]) 04/27/2016   Urinary frequency 01/04/2016   Urticaria     Past Surgical History:  Procedure Laterality Date   ABDOMINAL HYSTERECTOMY     APPLICATION OF INTRAOPERATIVE CT SCAN N/A 12/19/2018   Procedure: APPLICATION OF INTRAOPERATIVE CT SCAN;  Surgeon: Erline Levine, MD;  Location: [facility];  Service: Neurosurgery;  Laterality: N/A;   bowel obstruction     COLON SURGERY     blocked colon   POSTERIOR CERVICAL FUSION/FORAMINOTOMY N/A 12/19/2018   Procedure: Cervical Seven to Thoracic One  Posterior cervical fusion;  Surgeon: Erline Levine, MD;  Location: [facility];  Service: Neurosurgery;  Laterality: N/A;  Cervical Seven to Thoracic One Posterior cervical fusion    Social History   Socioeconomic History   Marital status: Widowed    Spouse name: Not on file   Number of children: Not on file   Years of education: Not on file   Highest education level: Not on file  Occupational History   Occupation: retired   Tobacco Use   Smoking status: Never   Smokeless tobacco: Current    Types: Snuff  Vaping Use   Vaping Use: Never used  Substance and Sexual Activity   Alcohol use: No   Drug use: No   Sexual activity: Not Currently    Birth control/protection: Surgical    Comment: hyst  Other Topics Concern   Not on file  Social History Narrative   Long term resident of [facility]    Social Determinants of Health   Financial Resource Strain: Not on file  Food Insecurity: Not on file  Transportation Needs: Not on file  Physical Activity: Not on file  Stress: Not on file  Social Connections: Not on file  Intimate Partner Violence: Not on file   Family History  Problem Relation Age of Onset   Diabetes Mother    Heart disease Brother    Hypertension Brother  Diabetes Brother    Hypertension Daughter    Diabetes Brother    Diabetes Brother    Alzheimer's disease Brother    Anxiety disorder Daughter       VITAL SIGNS BP (!) 137/59   Pulse 79   Temp 98.1 F (36.7 C)   Resp 20   Ht 5' (1.524 m)   Wt 181 lb 6.4 oz (82.3 kg)   SpO2 97%   BMI 35.43 kg/m   Outpatient Encounter Medications as of 09/27/2022  Medication Sig   acetaminophen (TYLENOL) 650 MG CR tablet Take 650 mg by mouth 3 (three) times daily.   amLODipine (NORVASC) 10 MG tablet Take 1 tablet (10 mg total) by mouth daily.   aspirin 81 MG chewable tablet Chew 81 mg by mouth daily.   busPIRone (BUSPAR) 5 MG tablet Take 5 mg by mouth 3 (three) times daily.   celecoxib (CELEBREX) 200 MG capsule Take 1  capsule by mouth 2 (two) times daily. For back pain. Give with food.   Cranberry 500 MG CAPS Take by mouth in the morning and at bedtime. For UTI prevention   cyanocobalamin 1000 MCG tablet Take 1,000 mcg by mouth daily. 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   DULoxetine (CYMBALTA) 60 MG capsule Take 60 mg by mouth daily. Pain management   lamoTRIgine (LAMICTAL) 25 MG tablet Take 25 mg by mouth daily.   linagliptin (TRADJENTA) 5 MG TABS tablet Take 5 mg by mouth daily.   loratadine (CLARITIN) 10 MG tablet Take 10 mg by mouth daily.   losartan (COZAAR) 25 MG tablet Take 12.5 mg by mouth daily.   Melatonin 5 MG TABS Take 5 mg by mouth at bedtime.   Menthol, Topical Analgesic, (BIOFREEZE) 4 % GEL Apply topically. Every Shift - PRN   metFORMIN (GLUCOPHAGE) 1000 MG tablet Take 1,000 mg by mouth 2 (two) times daily with a meal.    mirabegron ER (MYRBETRIQ) 50 MG TB24 tablet Take 50 mg by mouth daily.   montelukast (SINGULAIR) 10 MG tablet Take 1 tablet (10 mg total) by mouth at bedtime.   NON FORMULARY Diet: Consistent Carbohydrate   omeprazole (PRILOSEC) 20 MG capsule Take 20 mg by mouth daily. Special Instructions:  *TAKE ON AN EMPTY STOMACH* *DO NOT CRUSH*   polyethylene glycol (MIRALAX / GLYCOLAX) packet Take 17 g by mouth daily.   predniSONE (DELTASONE) 10 MG tablet Take 5 mg by mouth daily. Through 09-30-22   pregabalin (LYRICA) 25 MG capsule Take 1 capsule (25 mg total) by mouth at bedtime.   PRESCRIPTION MEDICATION MICROGUARD POWDER  powder; amt: SPARINGLY; topical Special Instructions: REDNESS UNDER BREAST Once A Day   PRESCRIPTION MEDICATION annusol cream  cream; amt: sparingly; rectal Special Instructions: hemmorhoids Once A Day - PRN   senna-docusate (SENOKOT-S) 8.6-50 MG tablet Take 1 tablet by mouth at bedtime.    sertraline (ZOLOFT) 50 MG tablet Take 50 mg by mouth daily.    simvastatin (ZOCOR) 10 MG tablet Take 10 mg by mouth at bedtime.    triamcinolone (NASACORT) 55 MCG/ACT AERO nasal inhaler Place 1 spray into the nose daily.   valACYclovir (VALTREX) 500 MG tablet Take 500 mg by mouth daily.    No facility-administered encounter medications on file as of 09/27/2022.     SIGNIFICANT DIAGNOSTIC EXAMS  PREVIOUS   07-27-22: dexa t score: 0.636  NO NEW EXAMS    LABS REVIEWED  PREVIOUS;    09-30-21: wbc 9.4; hgb 11.2; hct 36.2; mcv 85.4 plt 207; glucose 153; bun 18; creat 0.88; k+ 3.6; na++ 136; ca 8.7; GFR>60 liver normal albumin 3.6; hgb a1c 7.2; chol 124; ldl 50; trig 154; hdl 43; vit B 12: 1086 03-14-26: hgb a1c 7.2 04-07-22: wbc 10.9; hgb 11.1; hct 35.0; mcv 82.4 plt 233; glucose 147; bun 18; creat 0.88; k+ 3.6; na++ 139; ca 87; gfr >60; protein 6.5; albumin 3.6 chol 134; ldl 49; trig 169; hdl 51; urine micro-albumin 26.2    06-30-22: glucose 150; bun 17; creat 0.81; k+ 3.8; na++ 138; ca 9.1 gfr >60 07-02-22: wbc 10.5; hgb 11.1; hct 35.4; mcv 82.5 plt 230; glucose 138; bun 18; creat 0.98; k+ 4.2; na++ 137; ca 8.9; gfr >60;  07-08-22: hgb a1c 7.8   TODAY  09-26-22: hgb a1c: 8.0   Review of Systems  Constitutional:  Negative for malaise/fatigue.  Respiratory:  Negative for cough and shortness of breath.   Cardiovascular:  Negative for chest pain, palpitations and leg swelling.  Gastrointestinal:  Negative for abdominal pain, constipation and heartburn.  Musculoskeletal:  Negative for back pain, joint pain and myalgias.  Skin: Negative.   Neurological:  Negative for dizziness.  Psychiatric/Behavioral:  The patient is not nervous/anxious.    Physical Exam Constitutional:      General: She is not in acute distress.    Appearance: She is well-developed. She is obese. She is not diaphoretic.  Neck:     Thyroid: No thyromegaly.  Cardiovascular:     Rate and Rhythm: Normal rate and regular rhythm.     Pulses: Normal pulses.     Heart sounds: Normal  heart sounds.  Pulmonary:     Effort: Pulmonary effort is normal. No respiratory distress.     Breath sounds: Normal breath sounds.  Abdominal:     General: Bowel sounds are normal. There is no distension.     Palpations: Abdomen is soft.     Tenderness: There is no abdominal tenderness.  Musculoskeletal:        General: Normal range of motion.     Cervical back: Neck supple.     Right lower leg: No edema.     Left lower leg: No edema.  Lymphadenopathy:     Cervical: No cervical adenopathy.  Skin:    General: Skin is warm and dry.  Neurological:     Mental Status: She is alert and oriented to person, place, and time.  Psychiatric:        Mood and Affect: Mood normal.       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

## 2022-10-04 ENCOUNTER — Other Ambulatory Visit: Payer: Self-pay | Admitting: Adult Health

## 2022-10-04 MED ORDER — PREGABALIN 25 MG PO CAPS
25.0000 mg | ORAL_CAPSULE | Freq: Every day | ORAL | 0 refills | Status: DC
Start: 2022-10-04 — End: 2022-10-26

## 2022-10-05 DIAGNOSIS — Z23 Encounter for immunization: Secondary | ICD-10-CM | POA: Diagnosis not present

## 2022-10-18 ENCOUNTER — Encounter: Payer: Self-pay | Admitting: Internal Medicine

## 2022-10-18 ENCOUNTER — Non-Acute Institutional Stay (SKILLED_NURSING_FACILITY): Payer: Medicare Other | Admitting: Internal Medicine

## 2022-10-18 DIAGNOSIS — I152 Hypertension secondary to endocrine disorders: Secondary | ICD-10-CM | POA: Diagnosis not present

## 2022-10-18 DIAGNOSIS — E1151 Type 2 diabetes mellitus with diabetic peripheral angiopathy without gangrene: Secondary | ICD-10-CM | POA: Diagnosis not present

## 2022-10-18 DIAGNOSIS — F339 Major depressive disorder, recurrent, unspecified: Secondary | ICD-10-CM | POA: Diagnosis not present

## 2022-10-18 DIAGNOSIS — E1159 Type 2 diabetes mellitus with other circulatory complications: Secondary | ICD-10-CM

## 2022-10-18 DIAGNOSIS — M170 Bilateral primary osteoarthritis of knee: Secondary | ICD-10-CM | POA: Diagnosis not present

## 2022-10-18 NOTE — Progress Notes (Unsigned)
   NURSING HOME LOCATION:  [facility] ROOM NUMBER:  115  CODE STATUS:  Full Code  PCP:  Ok Edwards NP  This is a nursing facility follow up visit of chronic medical diagnoses & to document compliance with Regulation 483.30 (c) in The [place] Manual Phase 2 which mandates caregiver visit ( visits can alternate among physician, PA or NP as per statutes) within 10 days of 30 days / 60 days/ 90 days post admission to SNF date    Interim medical record and care since last SNF visit was updated with review of diagnostic studies and change in clinical status since last visit were documented.  HPI:  Review of systems: Dementia invalidated responses. Date given as   Constitutional: No fever, significant weight change, fatigue  Eyes: No redness, discharge, pain, vision change ENT/mouth: No nasal congestion,  purulent discharge, earache, change in hearing, sore throat  Cardiovascular: No chest pain, palpitations, paroxysmal nocturnal dyspnea, claudication, edema  Respiratory: No cough, sputum production, hemoptysis, DOE, significant snoring, apnea   Gastrointestinal: No heartburn, dysphagia, abdominal pain, nausea /vomiting, rectal bleeding, melena, change in bowels Genitourinary: No dysuria, hematuria, pyuria, incontinence, nocturia Musculoskeletal: No joint stiffness, joint swelling, weakness, pain Dermatologic: No rash, pruritus, change in appearance of skin Neurologic: No dizziness, headache, syncope, seizures, numbness, tingling Psychiatric: No significant anxiety, depression, insomnia, anorexia Endocrine: No change in hair/skin/nails, excessive thirst, excessive hunger, excessive urination  Hematologic/lymphatic: No significant bruising, lymphadenopathy, abnormal bleeding Allergy/immunology: No itchy/watery eyes, significant sneezing, urticaria, angioedema  Physical exam:  Pertinent or positive findings: General appearance: Adequately nourished; no  acute distress, increased work of breathing is present.   Lymphatic: No lymphadenopathy about the head, neck, axilla. Eyes: No conjunctival inflammation or lid edema is present. There is no scleral icterus. Ears:  External ear exam shows no significant lesions or deformities.   Nose:  External nasal examination shows no deformity or inflammation. Nasal mucosa are pink and moist without lesions, exudates Oral exam:  Lips and gums are healthy appearing. There is no oropharyngeal erythema or exudate. Neck:  No thyromegaly, masses, tenderness noted.    Heart:  Normal rate and regular rhythm. S1 and S2 normal without gallop, murmur, click, rub .  Lungs: Chest clear to auscultation without wheezes, rhonchi, rales, rubs. Abdomen: Bowel sounds are normal. Abdomen is soft and nontender with no organomegaly, hernias, masses. GU: Deferred  Extremities:  No cyanosis, clubbing, edema  Neurologic exam : Cn 2-7 intact Strength equal  in upper & lower extremities Balance, Rhomberg, finger to nose testing could not be completed due to clinical state Deep tendon reflexes are equal Skin: Warm & dry w/o tenting. No significant lesions or rash.  See summary under each active problem in the Problem List with associated updated therapeutic plan

## 2022-10-19 DIAGNOSIS — M170 Bilateral primary osteoarthritis of knee: Secondary | ICD-10-CM | POA: Insufficient documentation

## 2022-10-19 NOTE — Patient Instructions (Signed)
See assessment and plan under each diagnosis in the problem list and acutely for this visit 

## 2022-10-19 NOTE — Assessment & Plan Note (Signed)
She describes exacerbation of pain in the left knee over the last 1-2 weeks.  On exam there is crepitus in the right knee greater than the left but there is slight effusion suggested on the left and slight fusiform enlargement.  Subjectively Biofreeze has been of benefit and this will be renewed.

## 2022-10-19 NOTE — Assessment & Plan Note (Signed)
She does have isolated systolics in the 160s, but most blood pressures systolically are in the 130s to 140s.  Verify with NP that low-dose losartan was increased to 25 mg.  If so consider increasing dose further.

## 2022-10-19 NOTE — Assessment & Plan Note (Addendum)
A1c is 8% but this is in the context of recent course of prednisone therapy.  This is adequate control in the context of her advanced age and multiple comorbidities.

## 2022-10-19 NOTE — Assessment & Plan Note (Signed)
Clinically she is not exhibiting depression at this time.  She is very interactive and communicative.  She is delighted with the acquisition of hearing aids bilaterally.  She is excited about going to the beach for an extended period of time with her daughter and son-in-law.

## 2022-10-24 ENCOUNTER — Other Ambulatory Visit (HOSPITAL_COMMUNITY): Payer: Self-pay | Admitting: Adult Health

## 2022-10-24 DIAGNOSIS — Z1231 Encounter for screening mammogram for malignant neoplasm of breast: Secondary | ICD-10-CM

## 2022-10-26 ENCOUNTER — Other Ambulatory Visit: Payer: Self-pay | Admitting: Adult Health

## 2022-10-26 MED ORDER — PREGABALIN 25 MG PO CAPS
25.0000 mg | ORAL_CAPSULE | Freq: Every day | ORAL | 0 refills | Status: DC
Start: 2022-10-26 — End: 2022-11-21

## 2022-11-14 ENCOUNTER — Other Ambulatory Visit: Payer: Self-pay | Admitting: Adult Health

## 2022-11-18 ENCOUNTER — Non-Acute Institutional Stay (SKILLED_NURSING_FACILITY): Payer: Medicare Other | Admitting: Adult Health

## 2022-11-18 ENCOUNTER — Encounter: Payer: Self-pay | Admitting: Adult Health

## 2022-11-18 DIAGNOSIS — F339 Major depressive disorder, recurrent, unspecified: Secondary | ICD-10-CM

## 2022-11-18 DIAGNOSIS — N183 Chronic kidney disease, stage 3 unspecified: Secondary | ICD-10-CM

## 2022-11-18 DIAGNOSIS — E1122 Type 2 diabetes mellitus with diabetic chronic kidney disease: Secondary | ICD-10-CM

## 2022-11-18 DIAGNOSIS — I152 Hypertension secondary to endocrine disorders: Secondary | ICD-10-CM

## 2022-11-18 DIAGNOSIS — E1159 Type 2 diabetes mellitus with other circulatory complications: Secondary | ICD-10-CM | POA: Diagnosis not present

## 2022-11-18 DIAGNOSIS — K5909 Other constipation: Secondary | ICD-10-CM

## 2022-11-18 NOTE — Progress Notes (Unsigned)
Location:  [facility] Room Number: 115 Place of Service:  SNF (31) Provider:  Synthia Innocent, NP   CODE STATUS: FULL CODE  Allergies  Allergen Reactions   Gabapentin    Lisinopril    Penicillins Nausea Only    Has patient had a PCN reaction causing immediate rash, facial/tongue/throat swelling, SOB or lightheadedness with hypotension: Yes Has patient had a PCN reaction causing severe rash involving mucus membranes or skin necrosis: No Has patient had a PCN reaction that required hospitalization No Has patient had a PCN reaction occurring within the last 10 years: No If all of the above answers are "NO", then may proceed with Cephalosporin use.    Sulfa Antibiotics Nausea And Vomiting    Chief Complaint  Patient presents with   Medical Management of Chronic Issues    Routine follow up   Immunizations    COVID vaccine dose 6 is due   Quality Metric Gaps    Eye exam due    HPI:    Past Medical History:  Diagnosis Date   Anxiety    Bowel obstruction ([facility])    Burning with urination 01/04/2016   Diabetes mellitus with neuropathy    Genital herpes    GERD (gastroesophageal reflux disease)    Hematuria 01/04/2016   Herpes 12/22/2015   Hyperlipidemia    Hypertension    LLQ abdominal tenderness 01/04/2016   Neuropathy    Pelvic pressure in female 01/04/2016   Recurrent UTI    Sciatic nerve disease    Small bowel obstruction ([facility]) 04/27/2016   Urinary frequency 01/04/2016   Urticaria     Past Surgical History:  Procedure Laterality Date   ABDOMINAL HYSTERECTOMY     APPLICATION OF INTRAOPERATIVE CT SCAN N/A 12/19/2018   Procedure: APPLICATION OF INTRAOPERATIVE CT SCAN;  Surgeon: Maeola Harman, MD;  Location: [facility] OR;  Service: Neurosurgery;  Laterality: N/A;   bowel obstruction     COLON SURGERY     blocked colon   POSTERIOR CERVICAL FUSION/FORAMINOTOMY N/A 12/19/2018   Procedure: Cervical Seven to Thoracic One Posterior cervical fusion;   Surgeon: Maeola Harman, MD;  Location: [facility] OR;  Service: Neurosurgery;  Laterality: N/A;  Cervical Seven to Thoracic One Posterior cervical fusion    Social History   Socioeconomic History   Marital status: Widowed    Spouse name: Not on file   Number of children: Not on file   Years of education: Not on file   Highest education level: Not on file  Occupational History   Occupation: retired   Tobacco Use   Smoking status: Never   Smokeless tobacco: Current    Types: Snuff  Vaping Use   Vaping Use: Never used  Substance and Sexual Activity   Alcohol use: No   Drug use: No   Sexual activity: Not Currently    Birth control/protection: Surgical    Comment: hyst  Other Topics Concern   Not on file  Social History Narrative   Long term resident of [facility]    Social Determinants of Health   Financial Resource Strain: Not on file  Food Insecurity: Not on file  Transportation Needs: Not on file  Physical Activity: Not on file  Stress: Not on file  Social Connections: Not on file  Intimate Partner Violence: Not on file   Family History  Problem Relation Age of Onset   Diabetes Mother    Heart disease Brother    Hypertension Brother    Diabetes Brother  Hypertension Daughter    Diabetes Brother    Diabetes Brother    Alzheimer's disease Brother    Anxiety disorder Daughter       VITAL SIGNS BP 117/70   Pulse (!) 50   Temp (!) 97.5 F (36.4 C)   Resp 20   Ht 5' (1.524 m)   Wt 187 lb 6.4 oz (85 kg)   SpO2 96%   BMI 36.60 kg/m   Outpatient Encounter Medications as of 11/18/2022  Medication Sig   acetaminophen (TYLENOL) 650 MG CR tablet Take 650 mg by mouth 3 (three) times daily.   amLODipine (NORVASC) 10 MG tablet Take 1 tablet (10 mg total) by mouth daily.   aspirin 81 MG chewable tablet Chew 81 mg by mouth daily.   busPIRone (BUSPAR) 5 MG tablet Take 5 mg by mouth 3 (three) times daily.   celecoxib (CELEBREX) 100 MG capsule Take 1 capsule by mouth 2 (two)  times daily. For back pain. Give with food.   Cranberry 500 MG CAPS Take by mouth in the morning and at bedtime. For UTI prevention   cyanocobalamin 1000 MCG tablet Take 1,000 mcg by mouth daily. 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   DULoxetine (CYMBALTA) 60 MG capsule Take 60 mg by mouth daily. Pain management   insulin glargine-yfgn (SEMGLEE) 100 UNIT/ML Pen Inject 10 Units into the skin at bedtime.   lamoTRIgine (LAMICTAL) 25 MG tablet Take 25 mg by mouth daily.   linagliptin (TRADJENTA) 5 MG TABS tablet Take 5 mg by mouth daily.   loratadine (CLARITIN) 10 MG tablet Take 10 mg by mouth daily.   losartan (COZAAR) 25 MG tablet Take 12.5 mg by mouth daily.   Melatonin 5 MG TABS Take 5 mg by mouth at bedtime.   Menthol, Topical Analgesic, (BIOFREEZE) 4 % GEL Apply topically. Every Shift - PRN   metFORMIN (GLUCOPHAGE) 1000 MG tablet Take 1,000 mg by mouth 2 (two) times daily with a meal.    mirabegron ER (MYRBETRIQ) 50 MG TB24 tablet Take 50 mg by mouth daily.   montelukast (SINGULAIR) 10 MG tablet Take 1 tablet (10 mg total) by mouth at bedtime.   NON FORMULARY Diet: Consistent Carbohydrate   omeprazole (PRILOSEC) 20 MG capsule Take 20 mg by mouth daily. Special Instructions:  *TAKE ON AN EMPTY STOMACH* *DO NOT CRUSH*   polyethylene glycol (MIRALAX / GLYCOLAX) packet Take 17 g by mouth daily.   pregabalin (LYRICA) 25 MG capsule Take 1 capsule (25 mg total) by mouth at bedtime.   PRESCRIPTION MEDICATION MICROGUARD POWDER  powder; amt: SPARINGLY; topical Special Instructions: REDNESS UNDER BREAST Once A Day   PRESCRIPTION MEDICATION annusol cream  cream; amt: sparingly; rectal Special Instructions: hemmorhoids Once A Day - PRN   senna-docusate (SENOKOT-S) 8.6-50 MG tablet Take 1 tablet by mouth at bedtime.    sertraline (ZOLOFT) 50 MG tablet Take 50 mg by mouth daily.   simvastatin  (ZOCOR) 10 MG tablet Take 10 mg by mouth at bedtime.    triamcinolone (NASACORT) 55 MCG/ACT AERO nasal inhaler Place 1 spray into the nose daily.   valACYclovir (VALTREX) 500 MG tablet Take 500 mg by mouth daily.    [DISCONTINUED] predniSONE (DELTASONE) 10 MG tablet Take 5 mg by mouth daily. Through 09-30-22   No facility-administered encounter medications on file as of 11/18/2022.     SIGNIFICANT DIAGNOSTIC EXAMS  ASSESSMENT/ PLAN:     Ok Edwards NP [facility]  Contact [phone] Monday through Friday 8am- 5pm  After hours call [phone]

## 2022-11-21 ENCOUNTER — Other Ambulatory Visit: Payer: Self-pay | Admitting: Adult Health

## 2022-11-21 MED ORDER — PREGABALIN 25 MG PO CAPS: 25.0000 mg | ORAL_CAPSULE | Freq: Every day | ORAL | 0 refills | Status: DC

## 2022-11-30 ENCOUNTER — Ambulatory Visit (HOSPITAL_COMMUNITY): Payer: Medicare Other

## 2022-12-02 ENCOUNTER — Ambulatory Visit (HOSPITAL_COMMUNITY)
Admission: RE | Admit: 2022-12-02 | Discharge: 2022-12-02 | Disposition: A | Discharge status: Home / Self Care | Payer: Medicare Other | Source: Ambulatory Visit | Attending: Adult Health | Admitting: Adult Health

## 2022-12-02 DIAGNOSIS — Z1231 Encounter for screening mammogram for malignant neoplasm of breast: Secondary | ICD-10-CM | POA: Insufficient documentation

## 2022-12-09 ENCOUNTER — Encounter: Payer: Self-pay | Admitting: Adult Health

## 2022-12-09 ENCOUNTER — Non-Acute Institutional Stay (SKILLED_NURSING_FACILITY): Payer: Medicare Other | Admitting: Adult Health

## 2022-12-09 DIAGNOSIS — I700 Atherosclerosis of aorta: Secondary | ICD-10-CM | POA: Diagnosis not present

## 2022-12-09 DIAGNOSIS — I152 Hypertension secondary to endocrine disorders: Secondary | ICD-10-CM

## 2022-12-09 DIAGNOSIS — E1122 Type 2 diabetes mellitus with diabetic chronic kidney disease: Secondary | ICD-10-CM

## 2022-12-09 DIAGNOSIS — E1159 Type 2 diabetes mellitus with other circulatory complications: Secondary | ICD-10-CM | POA: Diagnosis not present

## 2022-12-09 DIAGNOSIS — N183 Chronic kidney disease, stage 3 unspecified: Secondary | ICD-10-CM | POA: Diagnosis not present

## 2022-12-09 NOTE — Progress Notes (Signed)
Location:  [facility] Room Number: 115-W Place of Service:  SNF (31)   CODE STATUS: Full Code   Allergies  Allergen Reactions   Gabapentin    Lisinopril    Penicillins Nausea Only    Has patient had a PCN reaction causing immediate rash, facial/tongue/throat swelling, SOB or lightheadedness with hypotension: Yes Has patient had a PCN reaction causing severe rash involving mucus membranes or skin necrosis: No Has patient had a PCN reaction that required hospitalization No Has patient had a PCN reaction occurring within the last 10 years: No If all of the above answers are "NO", then may proceed with Cephalosporin use.    Sulfa Antibiotics Nausea And Vomiting    Chief Complaint  Patient presents with   Care Plan Meeting    HPI:  We have come together for her care plan meeting. BIMS 15/15 mood 6/30: nervous at times decreased energy. She is ambulatory with a walker with no falls. She is supervision to independent with her adls. She is occasionally incontinent of bladder and bowel. Dietary: weight is 187 pounds; is stable is on CON CHO diet appetite: 75-100%; feeds self. Therapy: none at this time. Activities: is engaged with facility activities. She continues to be followed for her chronic illnesses including: Aortic atherosclerosis  CKD stage 3 due to type 2 diabetes mellitus   Hypertension associated with type 2 diabetes mellitus   Past Medical History:  Diagnosis Date   Anxiety    Bowel obstruction (HCC)    Burning with urination 01/04/2016   Diabetes mellitus with neuropathy    Genital herpes    GERD (gastroesophageal reflux disease)    Hematuria 01/04/2016   Herpes 12/22/2015   Hyperlipidemia    Hypertension    LLQ abdominal tenderness 01/04/2016   Neuropathy    Pelvic pressure in female 01/04/2016   Recurrent UTI    Sciatic nerve disease    Small bowel obstruction (HCC) 04/27/2016   Urinary frequency 01/04/2016   Urticaria     Past  Surgical History:  Procedure Laterality Date   ABDOMINAL HYSTERECTOMY     APPLICATION OF INTRAOPERATIVE CT SCAN N/A 12/19/2018   Procedure: APPLICATION OF INTRAOPERATIVE CT SCAN;  Surgeon: Maeola Harman, MD;  Location: [facility] OR;  Service: Neurosurgery;  Laterality: N/A;   bowel obstruction     COLON SURGERY     blocked colon   POSTERIOR CERVICAL FUSION/FORAMINOTOMY N/A 12/19/2018   Procedure: Cervical Seven to Thoracic One Posterior cervical fusion;  Surgeon: Maeola Harman, MD;  Location: [facility] OR;  Service: Neurosurgery;  Laterality: N/A;  Cervical Seven to Thoracic One Posterior cervical fusion    Social History   Socioeconomic History   Marital status: Widowed    Spouse name: Not on file   Number of children: Not on file   Years of education: Not on file   Highest education level: Not on file  Occupational History   Occupation: retired   Tobacco Use   Smoking status: Never   Smokeless tobacco: Current    Types: Snuff  Vaping Use   Vaping Use: Never used  Substance and Sexual Activity   Alcohol use: No   Drug use: No   Sexual activity: Not Currently    Birth control/protection: Surgical    Comment: hyst  Other Topics Concern   Not on file  Social History Narrative   Long term resident of [facility]    Social Determinants of Health   Financial Resource Strain: Not on file  Food Insecurity: Not on file  Transportation Needs: Not on file  Physical Activity: Not on file  Stress: Not on file  Social Connections: Not on file  Intimate Partner Violence: Not on file   Family History  Problem Relation Age of Onset   Diabetes Mother    Heart disease Brother    Hypertension Brother    Diabetes Brother    Hypertension Daughter    Diabetes Brother    Diabetes Brother    Alzheimer's disease Brother    Anxiety disorder Daughter       VITAL SIGNS BP (!) 161/77   Pulse 82   Temp (!) 97.5 F (36.4 C)   Resp 18   Ht 5' (1.524 m)   Wt 187 lb 6.4 oz (85 kg)   SpO2 99%   BMI 36.60  kg/m   Outpatient Encounter Medications as of 12/09/2022  Medication Sig   acetaminophen (TYLENOL) 650 MG CR tablet Take 650 mg by mouth 3 (three) times daily.   amLODipine (NORVASC) 10 MG tablet Take 1 tablet (10 mg total) by mouth daily.   aspirin 81 MG chewable tablet Chew 81 mg by mouth daily.   busPIRone (BUSPAR) 5 MG tablet Take 5 mg by mouth 3 (three) times daily.   celecoxib (CELEBREX) 100 MG capsule Take 1 capsule by mouth 2 (two) times daily. For back pain. Give with food.   Cranberry 500 MG CAPS Take by mouth in the morning and at bedtime. For UTI prevention   cyanocobalamin 1000 MCG tablet Take 1,000 mcg by mouth daily. Special Instructions: for vit B 12 deficiency   Dextromethorphan-guaiFENesin 10-100 MG/5ML liquid Take 10 mLs by mouth at bedtime as needed.   DULoxetine (CYMBALTA) 60 MG capsule Take 60 mg by mouth daily. Pain management   insulin glargine-yfgn (SEMGLEE) 100 UNIT/ML Pen Inject 10 Units into the skin at bedtime.   lamoTRIgine (LAMICTAL) 25 MG tablet Take 25 mg by mouth daily.   linagliptin (TRADJENTA) 5 MG TABS tablet Take 5 mg by mouth daily.   loratadine (CLARITIN) 10 MG tablet Take 10 mg by mouth daily.   losartan (COZAAR) 25 MG tablet Take 12.5 mg by mouth daily.   Melatonin 5 MG TABS Take 5 mg by mouth at bedtime.   Menthol, Topical Analgesic, (BIOFREEZE) 4 % GEL Apply topically. Every Shift - PRN   metFORMIN (GLUCOPHAGE) 1000 MG tablet Take 1,000 mg by mouth 2 (two) times daily with a meal.    mirabegron ER (MYRBETRIQ) 50 MG TB24 tablet Take 50 mg by mouth daily.   montelukast (SINGULAIR) 10 MG tablet Take 1 tablet (10 mg total) by mouth at bedtime.   NON FORMULARY Diet: Regular diet   omeprazole (PRILOSEC) 20 MG capsule Take 20 mg by mouth daily. Special Instructions:  *TAKE ON AN EMPTY STOMACH* *DO NOT CRUSH*   polyethylene glycol (MIRALAX / GLYCOLAX) packet Take 17 g by mouth daily.   pregabalin (LYRICA) 25 MG capsule Take 1 capsule (25 mg total) by  mouth at bedtime.   PRESCRIPTION MEDICATION MICROGUARD POWDER  powder; amt: SPARINGLY; topical Special Instructions: REDNESS UNDER BREAST Once A Day   PRESCRIPTION MEDICATION annusol cream  cream; amt: sparingly; rectal Special Instructions: hemmorhoids Once A Day - PRN   senna-docusate (SENOKOT-S) 8.6-50 MG tablet Take 1 tablet by mouth at bedtime.    sertraline (ZOLOFT) 50 MG tablet Take 50 mg by mouth daily.   simvastatin (ZOCOR) 10 MG tablet Take 10 mg by mouth at bedtime.  triamcinolone (NASACORT) 55 MCG/ACT AERO nasal inhaler Place 1 spray into the nose daily.   valACYclovir (VALTREX) 500 MG tablet Take 500 mg by mouth daily.    No facility-administered encounter medications on file as of 12/09/2022.     SIGNIFICANT DIAGNOSTIC EXAMS  PREVIOUS   07-27-22: dexa t score: 0.636  NO NEW EXAMS    LABS REVIEWED PREVIOUS;    04-07-22: wbc 10.9; hgb 11.1; hct 35.0; mcv 82.4 plt 233; glucose 147; bun 18; creat 0.88; k+ 3.6; na++ 139; ca 87; gfr >60; protein 6.5; albumin 3.6 chol 134; ldl 49; trig 169; hdl 51; urine micro-albumin 26.2    06-30-22: glucose 150; bun 17; creat 0.81; k+ 3.8; na++ 138; ca 9.1 gfr >60 07-02-22: wbc 10.5; hgb 11.1; hct 35.4; mcv 82.5 plt 230; glucose 138; bun 18; creat 0.98; k+ 4.2; na++ 137; ca 8.9; gfr >60;  07-08-22: hgb a1c 7.8  09-26-22: hgb a1c: 8.0  NO NEW LABS.   Review of Systems  Constitutional:  Negative for malaise/fatigue.  Respiratory:  Negative for cough and shortness of breath.   Cardiovascular:  Negative for chest pain, palpitations and leg swelling.  Gastrointestinal:  Negative for abdominal pain, constipation and heartburn.  Musculoskeletal:  Negative for back pain, joint pain and myalgias.  Skin: Negative.   Neurological:  Negative for dizziness.  Psychiatric/Behavioral:  The patient is not nervous/anxious.    Physical Exam Constitutional:      General: She is not in acute distress.    Appearance: She is well-developed. She is  obese. She is not diaphoretic.  Neck:     Thyroid: No thyromegaly.  Cardiovascular:     Rate and Rhythm: Normal rate and regular rhythm.     Pulses: Normal pulses.     Heart sounds: Normal heart sounds.  Pulmonary:     Effort: Pulmonary effort is normal. No respiratory distress.     Breath sounds: Normal breath sounds.  Abdominal:     General: Bowel sounds are normal. There is no distension.     Palpations: Abdomen is soft.     Tenderness: There is no abdominal tenderness.  Musculoskeletal:        General: Normal range of motion.     Cervical back: Neck supple.     Right lower leg: No edema.     Left lower leg: No edema.  Lymphadenopathy:     Cervical: No cervical adenopathy.  Skin:    General: Skin is warm and dry.  Neurological:     Mental Status: She is alert and oriented to person, place, and time.  Psychiatric:        Mood and Affect: Mood normal.     ASSESSMENT/ PLAN:  TODAY  Aortic atherosclerosis  CKD stage 3 due to type 2 diabetes mellitus  Hypertension associated with type 2 diabetes mellitus   Will continue current medications Will continue current plan of care Will continue to monitor her status  Time spent with patient: 40 minutes: plan of care medications; dietary   Synthia Innocent NP [facility]  call [phone]

## 2022-12-19 DIAGNOSIS — F0390 Unspecified dementia without behavioral disturbance: Secondary | ICD-10-CM | POA: Diagnosis not present

## 2022-12-19 DIAGNOSIS — Z1152 Encounter for screening for COVID-19: Secondary | ICD-10-CM | POA: Diagnosis not present

## 2022-12-19 DIAGNOSIS — Z20828 Contact with and (suspected) exposure to other viral communicable diseases: Secondary | ICD-10-CM | POA: Diagnosis not present

## 2022-12-21 ENCOUNTER — Encounter: Payer: Self-pay | Admitting: Adult Health

## 2022-12-21 ENCOUNTER — Non-Acute Institutional Stay (SKILLED_NURSING_FACILITY): Payer: Medicare Other | Admitting: Adult Health

## 2022-12-21 DIAGNOSIS — D649 Anemia, unspecified: Secondary | ICD-10-CM

## 2022-12-21 DIAGNOSIS — I700 Atherosclerosis of aorta: Secondary | ICD-10-CM | POA: Diagnosis not present

## 2022-12-21 DIAGNOSIS — E1169 Type 2 diabetes mellitus with other specified complication: Secondary | ICD-10-CM | POA: Diagnosis not present

## 2022-12-21 DIAGNOSIS — E1151 Type 2 diabetes mellitus with diabetic peripheral angiopathy without gangrene: Secondary | ICD-10-CM

## 2022-12-21 DIAGNOSIS — E785 Hyperlipidemia, unspecified: Secondary | ICD-10-CM

## 2022-12-21 NOTE — Progress Notes (Signed)
Location:  [place] Room Number: [place] of Service:  SNF (31)   CODE STATUS: Full Code   Allergies  Allergen Reactions   Gabapentin    Lisinopril    Penicillins Nausea Only    Has patient had a PCN reaction causing immediate rash, facial/tongue/throat swelling, SOB or lightheadedness with hypotension: Yes Has patient had a PCN reaction causing severe rash involving mucus membranes or skin necrosis: No Has patient had a PCN reaction that required hospitalization No Has patient had a PCN reaction occurring within the last 10 years: No If all of the above answers are "NO", then may proceed with Cephalosporin use.    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 There are no reports of uncontrolled pain. She is able to ambulate daily. Her weight is without significant change.   Past Medical History:  Diagnosis Date   Anxiety    Bowel obstruction ([place])    Burning with urination 01/04/2016   Diabetes mellitus with neuropathy    Genital herpes    GERD (gastroesophageal reflux disease)    Hematuria 01/04/2016   Herpes 12/22/2015   Hyperlipidemia    Hypertension    LLQ abdominal tenderness 01/04/2016   Neuropathy    Pelvic pressure in female 01/04/2016   Recurrent UTI    Sciatic nerve disease    Small bowel obstruction ([place]) 04/27/2016   Urinary frequency 01/04/2016   Urticaria     Past Surgical History:  Procedure Laterality Date    ABDOMINAL HYSTERECTOMY     APPLICATION OF INTRAOPERATIVE CT SCAN N/A 12/19/2018   Procedure: APPLICATION OF INTRAOPERATIVE CT SCAN;  Surgeon: Erline Levine, MD;  Location: [place];  Service: Neurosurgery;  Laterality: N/A;   bowel obstruction     COLON SURGERY     blocked colon   POSTERIOR CERVICAL FUSION/FORAMINOTOMY N/A 12/19/2018   Procedure: Cervical Seven to Thoracic One Posterior cervical fusion;  Surgeon: Erline Levine, MD;  Location: [place];  Service: Neurosurgery;  Laterality: N/A;  Cervical Seven to Thoracic One Posterior cervical fusion    Social History   Socioeconomic History   Marital status: Widowed    Spouse name: Not on file   Number of children: Not on file   Years of education: Not on file   Highest education level: Not on file  Occupational History   Occupation: retired   Tobacco Use   Smoking status: Never   Smokeless tobacco: Current    Types: Snuff  Vaping Use   Vaping Use: Never used  Substance and Sexual Activity   Alcohol use: No   Drug use: No   Sexual activity: Not Currently    Birth control/protection: Surgical    Comment: hyst  Other Topics Concern   Not on file  Social History Narrative   Long term resident of [facility]    Social Determinants of Health   Financial Resource Strain: Not  on file  Food Insecurity: Not on file  Transportation Needs: Not on file  Physical Activity: Not on file  Stress: Not on file  Social Connections: Not on file  Intimate Partner Violence: Not on file   Family History  Problem Relation Age of Onset   Diabetes Mother    Heart disease Brother    Hypertension Brother    Diabetes Brother    Hypertension Daughter    Diabetes Brother    Diabetes Brother    Alzheimer's disease Brother    Anxiety disorder Daughter       VITAL SIGNS BP 130/75   Pulse 74   Temp (!) 97.5 F (36.4 C) Comment: Vitas are recorded from [place], [employer]  Resp (!) 22 Comment: Vitas are recorded from [place], [employer]  Ht 5' (1.524 m)   Wt 189 lb (85.7 kg)   SpO2 95%   BMI 36.91 kg/m   Outpatient Encounter Medications as of 12/21/2022  Medication Sig   acetaminophen (TYLENOL) 650 MG CR tablet Take 650 mg by mouth 3 (three) times daily.   amLODipine (NORVASC) 10 MG tablet Take 1 tablet (10 mg total) by mouth daily.   aspirin 81 MG chewable tablet Chew 81 mg by mouth daily.   busPIRone (BUSPAR) 5 MG tablet Take 5 mg by mouth 3 (three) times daily.   celecoxib (CELEBREX) 100 MG capsule Take 1 capsule by mouth 2 (two) times daily. For back pain. Give with food.   Cranberry 500 MG CAPS Take by mouth in the morning and at bedtime. For UTI prevention   cyanocobalamin 1000 MCG tablet Take 1,000 mcg by mouth daily. Special Instructions: for vit B 12 deficiency   Dextromethorphan-guaiFENesin 10-100 MG/5ML liquid Take 10 mLs by mouth at bedtime as needed.   DULoxetine (CYMBALTA) 60 MG capsule Take 60 mg by mouth daily. Pain management   Insulin Glargine (BASAGLAR KWIKPEN) 100 UNIT/ML Inject 10 Units into the skin at bedtime.   lamoTRIgine (LAMICTAL) 25 MG tablet Take 25 mg by mouth daily.   linagliptin (TRADJENTA) 5 MG TABS tablet Take 5 mg by mouth daily.   loratadine (CLARITIN) 10 MG tablet Take 10 mg by mouth daily.   losartan (COZAAR) 25 MG tablet Take 12.5 mg by mouth daily.   Melatonin 5 MG TABS Take 5 mg by mouth at bedtime.   Menthol, Topical Analgesic, (BIOFREEZE) 4 % GEL Apply topically. Every Shift - PRN   metFORMIN (GLUCOPHAGE) 1000 MG tablet Take 1,000 mg by mouth 2 (two) times daily with a meal.    mirabegron ER (MYRBETRIQ) 50 MG TB24 tablet Take 50 mg by mouth daily.   montelukast (SINGULAIR) 10 MG tablet Take 1 tablet (10 mg total) by mouth at bedtime.   NON FORMULARY Diet: Regular diet   omeprazole (PRILOSEC) 20 MG capsule Take 20 mg by mouth daily. Special Instructions:  *TAKE ON AN EMPTY STOMACH* *DO NOT CRUSH*   polyethylene glycol (MIRALAX / GLYCOLAX) packet Take 17  g by mouth daily.   pregabalin (LYRICA) 25 MG capsule Take 1 capsule (25 mg total) by mouth at bedtime.   PRESCRIPTION MEDICATION MICROGUARD POWDER  powder; amt: SPARINGLY; topical Special Instructions: REDNESS UNDER BREAST Once A Day   PRESCRIPTION MEDICATION annusol cream  cream; amt: sparingly; rectal Special Instructions: hemmorhoids Once A Day - PRN   senna-docusate (SENOKOT-S) 8.6-50 MG tablet Take 1 tablet by mouth at bedtime.    sertraline (ZOLOFT) 50 MG tablet Take  50 mg by mouth daily.   simvastatin (ZOCOR) 10 MG tablet Take 10 mg by mouth at bedtime.    triamcinolone (NASACORT) 55 MCG/ACT AERO nasal inhaler Place 1 spray into the nose daily.   valACYclovir (VALTREX) 500 MG tablet Take 500 mg by mouth daily.    [DISCONTINUED] insulin glargine-yfgn (SEMGLEE) 100 UNIT/ML Pen Inject 10 Units into the skin at bedtime.   No facility-administered encounter medications on file as of 12/21/2022.     SIGNIFICANT DIAGNOSTIC EXAMS  PREVIOUS   07-27-22: dexa t score: 0.636  NO NEW EXAMS    LABS REVIEWED PREVIOUS;    04-07-22: wbc 10.9; hgb 11.1; hct 35.0; mcv 82.4 plt 233; glucose 147; bun 18; creat 0.88; k+ 3.6; na++ 139; ca 87; gfr >60; protein 6.5; albumin 3.6 chol 134; ldl 49; trig 169; hdl 51; urine micro-albumin 26.2    06-30-22: glucose 150; bun 17; creat 0.81; k+ 3.8; na++ 138; ca 9.1 gfr >60 07-02-22: wbc 10.5; hgb 11.1; hct 35.4; mcv 82.5 plt 230; glucose 138; bun 18; creat 0.98; k+ 4.2; na++ 137; ca 8.9; gfr >60;  07-08-22: hgb a1c 7.8  09-26-22: hgb a1c: 8.0  NO NEW LABS.   Review of Systems  Constitutional:  Negative for malaise/fatigue.  Respiratory:  Negative for cough and shortness of breath.   Cardiovascular:  Negative for chest pain, palpitations and leg swelling.  Gastrointestinal:  Negative for abdominal pain, constipation and heartburn.  Musculoskeletal:  Negative for back pain, joint pain and myalgias.  Skin: Negative.   Neurological:  Negative for  dizziness.  Psychiatric/Behavioral:  The patient is not nervous/anxious.    Physical Exam Constitutional:      General: She is not in acute distress.    Appearance: She is well-developed. She is obese. She is not diaphoretic.  Neck:     Thyroid: No thyromegaly.  Cardiovascular:     Rate and Rhythm: Normal rate and regular rhythm.     Pulses: Normal pulses.     Heart sounds: Normal heart sounds.  Pulmonary:     Effort: Pulmonary effort is normal. No respiratory distress.     Breath sounds: Normal breath sounds.  Abdominal:     General: Bowel sounds are normal. There is no distension.     Palpations: Abdomen is soft.     Tenderness: There is no abdominal tenderness.  Musculoskeletal:        General: Normal range of motion.     Cervical back: Neck supple.     Right lower leg: No edema.     Left lower leg: No edema.  Lymphadenopathy:     Cervical: No cervical adenopathy.  Skin:    General: Skin is warm and dry.  Neurological:     Mental Status: She is alert and oriented to person, place, and time.  Psychiatric:        Mood and Affect: Mood normal.       ASSESSMENT/ PLAN:  TODAY  Normocytic anemia: hgb 11.2 hct 36.2  2. Dyslipidemia associated with type 2 diabetes mellitus LDL is 50 will continue zocor 10 mg daily   3 aortic atherosclerosis (ct 12-18-19) is on asa and stating  4. 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 Gastroesophageal reflux disease without esophagitis: will continue prilosec 20 mg daily   6. OAB; will continue  myrbetriq 50 mg daily   7. Chronic generalized pain/left sciatic pain: will continue tylenol 650 mg three times daily celebrex 200 mg daily lyrica 25 mg nightly cymbalta 60 mg daily   8. Chronic non-seasonal allergic rhinitis: will continue claritin  10 mg daily and singulair 10 mg daily and  nasocort daily   9. Herpes: no recent outbreaks will continue valtrex 500 mg daily   10. Vitamin B 12 deficiency level is 1068 will continue 1000 mcg daily   11. Chronic constipation: will continue miralax daily senna s daily  12. Major depression recurrent chronic; will continue cymbalta 60 mg daily (failed one wean; also takes for pain); lamictal 25 mg twice daily for mood; melatonin 5 mg nightly for sleep. Zoloft 50 mg daily and buspar 5 mg three times daily   13. CKD stage 3 due to type 2 diabetes mellitus: bun 18; creat 0.98; gfr >60  14. Hypertension associated with type 2 diabetes mellitus: b/p 130/70: will continue cozaar 25 mg daily norvasc 10 mg daily asa 81 mg daily   Will check cbc; cmp; hgb A1c lipids   Synthia Innocent NP [facility]   call [phone]

## 2022-12-23 ENCOUNTER — Other Ambulatory Visit: Payer: Self-pay | Admitting: Adult Health

## 2022-12-23 MED ORDER — PREGABALIN 25 MG PO CAPS
25.0000 mg | ORAL_CAPSULE | Freq: Every day | ORAL | 0 refills | Status: DC
Start: 2022-12-23 — End: 2023-01-23

## 2022-12-29 ENCOUNTER — Other Ambulatory Visit (HOSPITAL_COMMUNITY)
Admission: RE | Admit: 2022-12-29 | Discharge: 2022-12-29 | Disposition: A | Discharge status: Home / Self Care | Payer: Medicare Other | Source: Skilled Nursing Facility | Attending: Adult Health | Admitting: Adult Health

## 2022-12-29 DIAGNOSIS — E1159 Type 2 diabetes mellitus with other circulatory complications: Secondary | ICD-10-CM | POA: Insufficient documentation

## 2022-12-29 LAB — CBC
HCT: 35.2 % — ABNORMAL LOW (ref 36.0–46.0)
Hemoglobin: 10.8 g/dL — ABNORMAL LOW (ref 12.0–15.0)
MCH: 25.4 pg — ABNORMAL LOW (ref 26.0–34.0)
MCHC: 30.7 g/dL (ref 30.0–36.0)
MCV: 82.6 fL (ref 80.0–100.0)
Platelets: 212 10*3/uL (ref 150–400)
RBC: 4.26 MIL/uL (ref 3.87–5.11)
RDW: 15.8 % — ABNORMAL HIGH (ref 11.5–15.5)
WBC: 10 10*3/uL (ref 4.0–10.5)
nRBC: 0 % (ref 0.0–0.2)

## 2022-12-29 LAB — COMPREHENSIVE METABOLIC PANEL
ALT: 15 U/L (ref 0–44)
AST: 17 U/L (ref 15–41)
Albumin: 3.6 g/dL (ref 3.5–5.0)
Alkaline Phosphatase: 66 U/L (ref 38–126)
Anion gap: 10 (ref 5–15)
BUN: 21 mg/dL (ref 8–23)
CO2: 27 mmol/L (ref 22–32)
Calcium: 8.7 mg/dL — ABNORMAL LOW (ref 8.9–10.3)
Chloride: 99 mmol/L (ref 98–111)
Creatinine, Ser: 0.95 mg/dL (ref 0.44–1.00)
GFR, Estimated: >60 mL/min (ref >60)
Glucose, Bld: 131 mg/dL — ABNORMAL HIGH (ref 70–99)
Potassium: 3.6 mmol/L (ref 3.5–5.1)
Sodium: 136 mmol/L (ref 135–145)
Total Bilirubin: 0.4 mg/dL (ref 0.3–1.2)
Total Protein: 6.6 g/dL (ref 6.5–8.1)

## 2022-12-29 LAB — HEMOGLOBIN A1C
Hgb A1c MFr Bld: 7.2 % — ABNORMAL HIGH (ref 4.8–5.6)
Mean Plasma Glucose: 159.94 mg/dL

## 2022-12-29 LAB — LIPID PANEL
Cholesterol: 132 mg/dL (ref 0–200)
HDL: 55 mg/dL (ref >40)
LDL Cholesterol: 50 mg/dL (ref 0–99)
Total CHOL/HDL Ratio: 2.4 RATIO
Triglycerides: 134 mg/dL (ref <150)
VLDL: 27 mg/dL (ref 0–40)

## 2023-01-12 DIAGNOSIS — M79661 Pain in right lower leg: Secondary | ICD-10-CM | POA: Diagnosis not present

## 2023-01-12 DIAGNOSIS — M79651 Pain in right thigh: Secondary | ICD-10-CM | POA: Diagnosis not present

## 2023-01-12 DIAGNOSIS — M25561 Pain in right knee: Secondary | ICD-10-CM | POA: Diagnosis not present

## 2023-01-12 DIAGNOSIS — M25551 Pain in right hip: Secondary | ICD-10-CM | POA: Diagnosis not present

## 2023-01-13 ENCOUNTER — Non-Acute Institutional Stay (SKILLED_NURSING_FACILITY): Payer: Medicare Other | Admitting: Adult Health

## 2023-01-13 ENCOUNTER — Encounter: Payer: Self-pay | Admitting: Adult Health

## 2023-01-13 DIAGNOSIS — M5432 Sciatica, left side: Secondary | ICD-10-CM | POA: Diagnosis not present

## 2023-01-13 NOTE — Progress Notes (Signed)
Location:  [place] Room Number: NO/115/W Place of Service:  SNF (31) Humaira Sculley S.,NP  CODE STATUS: FULL  Allergies  Allergen Reactions   Gabapentin    Lisinopril    Penicillins Nausea Only    Has patient had a PCN reaction causing immediate rash, facial/tongue/throat swelling, SOB or lightheadedness with hypotension: Yes Has patient had a PCN reaction causing severe rash involving mucus membranes or skin necrosis: No Has patient had a PCN reaction that required hospitalization No Has patient had a PCN reaction occurring within the last 10 years: No If all of the above answers are "NO", then may proceed with Cephalosporin use.    Sulfa Antibiotics Nausea And Vomiting    Chief Complaint  Patient presents with   Acute Visit    Patient is being seen for pain management    HPI:  She has a history of left  hip pain with sciatica. She has been weaned off prednisone. She is presently taking celebrex 100 mg twice daily. She is also taking lyrica nightly. For her GDR this medication was reduced to every other HS for 2 weeks then stop. She has not tolerated the wean and will need to restart this nightly   Past Medical History:  Diagnosis Date   Anxiety    Bowel obstruction ([place])    Burning with urination 01/04/2016   Diabetes mellitus with neuropathy    Genital herpes    GERD (gastroesophageal reflux disease)    Hematuria 01/04/2016   Herpes 12/22/2015   Hyperlipidemia    Hypertension    LLQ abdominal tenderness 01/04/2016   Neuropathy    Pelvic pressure in female 01/04/2016   Recurrent UTI    Sciatic nerve disease    Small bowel obstruction ([place]) 04/27/2016   Urinary frequency 01/04/2016   Urticaria     Past Surgical History:  Procedure Laterality Date   ABDOMINAL HYSTERECTOMY     APPLICATION OF INTRAOPERATIVE CT SCAN N/A 12/19/2018   Procedure: APPLICATION OF INTRAOPERATIVE CT SCAN;  Surgeon: Erline Levine, MD;  Location: [place];  Service:  Neurosurgery;  Laterality: N/A;   bowel obstruction     COLON SURGERY     blocked colon   POSTERIOR CERVICAL FUSION/FORAMINOTOMY N/A 12/19/2018   Procedure: Cervical Seven to Thoracic One Posterior cervical fusion;  Surgeon: Erline Levine, MD;  Location: [place];  Service: Neurosurgery;  Laterality: N/A;  Cervical Seven to Thoracic One Posterior cervical fusion    Social History   Socioeconomic History   Marital status: Widowed    Spouse name: Not on file   Number of children: Not on file   Years of education: Not on file   Highest education level: Not on file  Occupational History   Occupation: retired   Tobacco Use   Smoking status: Never   Smokeless tobacco: Current    Types: Snuff  Vaping Use   Vaping Use: Never used  Substance and Sexual Activity   Alcohol use: No   Drug use: No   Sexual activity: Not Currently    Birth control/protection: Surgical    Comment: hyst  Other Topics Concern   Not on file  Social History Narrative   Long [facility] resident of [facility]    Social Determinants of Health   Financial Resource Strain: Not on file  Food Insecurity: Not on file  Transportation Needs: Not on file  Physical Activity: Not on file  Stress: Not on file  Social Connections: Not on file  Intimate Partner  Violence: Not on file   Family History  Problem Relation Age of Onset   Diabetes Mother    Heart disease Brother    Hypertension Brother    Diabetes Brother    Hypertension Daughter    Diabetes Brother    Diabetes Brother    Alzheimer's disease Brother    Anxiety disorder Daughter       VITAL SIGNS BP (!) 148/79   Pulse 88   Temp 98 F (36.7 C)   Resp 18   Ht 5' (1.524 m)   Wt 184 lb (83.5 kg)   SpO2 97%   BMI 35.94 kg/m   Outpatient Encounter Medications as of 01/13/2023  Medication Sig   acetaminophen (TYLENOL) 650 MG CR tablet Take 650 mg by mouth 3 (three) times daily.   amLODipine (NORVASC) 10 MG tablet Take 1 tablet (10 mg total) by mouth daily.    aspirin 81 MG chewable tablet Chew 81 mg by mouth daily.   busPIRone (BUSPAR) 5 MG tablet Take 5 mg by mouth 3 (three) times daily.   celecoxib (CELEBREX) 100 MG capsule Take 1 capsule by mouth 2 (two) times daily. For back pain. Give with food.   Cranberry 500 MG CAPS Take by mouth in the morning and at bedtime. For UTI prevention   cyanocobalamin 1000 MCG tablet Take 1,000 mcg by mouth daily. Special Instructions: for vit B 12 deficiency   Dextromethorphan-guaiFENesin 10-100 MG/5ML liquid Take 10 mLs by mouth at bedtime as needed.   DULoxetine (CYMBALTA) 60 MG capsule Take 60 mg by mouth daily. Pain management   Insulin Glargine (BASAGLAR KWIKPEN) 100 UNIT/ML Inject 10 Units into the skin at bedtime.   lamoTRIgine (LAMICTAL) 25 MG tablet Take 25 mg by mouth daily.   linagliptin (TRADJENTA) 5 MG TABS tablet Take 5 mg by mouth daily.   loratadine (CLARITIN) 10 MG tablet Take 10 mg by mouth daily.   losartan (COZAAR) 25 MG tablet Take 12.5 mg by mouth daily.   Melatonin 5 MG TABS Take 5 mg by mouth at bedtime.   Menthol, Topical Analgesic, (BIOFREEZE) 4 % GEL Apply topically. Every Shift - PRN   metFORMIN (GLUCOPHAGE) 1000 MG tablet Take 1,000 mg by mouth 2 (two) times daily with a meal.    mirabegron ER (MYRBETRIQ) 50 MG TB24 tablet Take 50 mg by mouth daily.   montelukast (SINGULAIR) 10 MG tablet Take 1 tablet (10 mg total) by mouth at bedtime.   NON FORMULARY Diet: Regular diet   omeprazole (PRILOSEC) 20 MG capsule Take 20 mg by mouth daily. Special Instructions:  *TAKE ON AN EMPTY STOMACH* *DO NOT CRUSH*   polyethylene glycol (MIRALAX / GLYCOLAX) packet Take 17 g by mouth daily.   pregabalin (LYRICA) 25 MG capsule Take 1 capsule (25 mg total) by mouth at bedtime.   PRESCRIPTION MEDICATION MICROGUARD POWDER  powder; amt: SPARINGLY; topical Special Instructions: REDNESS UNDER BREAST Once A Day   PRESCRIPTION MEDICATION annusol cream  cream; amt: sparingly; rectal Special Instructions:  hemmorhoids Once A Day - PRN   senna-docusate (SENOKOT-S) 8.6-50 MG tablet Take 1 tablet by mouth at bedtime.    sertraline (ZOLOFT) 50 MG tablet Take 50 mg by mouth daily.   simvastatin (ZOCOR) 10 MG tablet Take 10 mg by mouth at bedtime.    triamcinolone (NASACORT) 55 MCG/ACT AERO nasal inhaler Place 1 spray into the nose daily.   valACYclovir (VALTREX) 500 MG tablet Take 500 mg by mouth daily.    No facility-administered encounter  medications on file as of 01/13/2023.     SIGNIFICANT DIAGNOSTIC EXAMS  PREVIOUS   07-27-22: dexa t score: 0.636  NO NEW EXAMS    LABS REVIEWED PREVIOUS;    04-07-22: wbc 10.9; hgb 11.1; hct 35.0; mcv 82.4 plt 233; glucose 147; bun 18; creat 0.88; k+ 3.6; na++ 139; ca 87; gfr >60; protein 6.5; albumin 3.6 chol 134; ldl 49; trig 169; hdl 51; urine micro-albumin 26.2    06-30-22: glucose 150; bun 17; creat 0.81; k+ 3.8; na++ 138; ca 9.1 gfr >60 07-02-22: wbc 10.5; hgb 11.1; hct 35.4; mcv 82.5 plt 230; glucose 138; bun 18; creat 0.98; k+ 4.2; na++ 137; ca 8.9; gfr >60;  07-08-22: hgb a1c 7.8  09-26-22: hgb a1c: 8.0  NO NEW LABS.   Review of Systems  Constitutional:  Negative for malaise/fatigue.  Respiratory:  Negative for cough and shortness of breath.   Cardiovascular:  Negative for chest pain, palpitations and leg swelling.  Gastrointestinal:  Negative for abdominal pain, constipation and heartburn.  Musculoskeletal:  Positive for back pain and joint pain. Negative for myalgias.  Skin: Negative.   Neurological:  Negative for dizziness.  Psychiatric/Behavioral:  The patient is not nervous/anxious.    Physical Exam Constitutional:      General: She is not in acute distress.    Appearance: She is well-developed. She is obese. She is not diaphoretic.  Neck:     Thyroid: No thyromegaly.  Cardiovascular:     Rate and Rhythm: Normal rate and regular rhythm.     Heart sounds: Normal heart sounds.  Pulmonary:     Effort: Pulmonary effort is normal. No  respiratory distress.     Breath sounds: Normal breath sounds.  Abdominal:     General: Bowel sounds are normal. There is no distension.     Palpations: Abdomen is soft.     Tenderness: There is no abdominal tenderness.  Musculoskeletal:        General: Normal range of motion.     Cervical back: Neck supple.     Right lower leg: No edema.     Left lower leg: No edema.  Lymphadenopathy:     Cervical: No cervical adenopathy.  Skin:    General: Skin is warm and dry.  Neurological:     Mental Status: She is alert and oriented to person, place, and time.  Psychiatric:        Mood and Affect: Mood normal.        ASSESSMENT/ PLAN:  TODAY  Sciatic pain on left: her pain is presently not being managed: will return the lyrica to 25 mg nightly and will monitor her response.    Ok Edwards NP [facility]  call [phone]

## 2023-01-23 ENCOUNTER — Other Ambulatory Visit: Payer: Self-pay | Admitting: Adult Health

## 2023-01-23 MED ORDER — PREGABALIN 25 MG PO CAPS: 25.0000 mg | ORAL_CAPSULE | Freq: Every day | ORAL | 0 refills | Status: DC

## 2023-01-24 ENCOUNTER — Encounter: Payer: Self-pay | Admitting: Internal Medicine

## 2023-01-24 ENCOUNTER — Non-Acute Institutional Stay (SKILLED_NURSING_FACILITY): Payer: Medicare Other | Admitting: Internal Medicine

## 2023-01-24 DIAGNOSIS — E1122 Type 2 diabetes mellitus with diabetic chronic kidney disease: Secondary | ICD-10-CM | POA: Diagnosis not present

## 2023-01-24 DIAGNOSIS — E1159 Type 2 diabetes mellitus with other circulatory complications: Secondary | ICD-10-CM

## 2023-01-24 DIAGNOSIS — D649 Anemia, unspecified: Secondary | ICD-10-CM

## 2023-01-24 DIAGNOSIS — N183 Chronic kidney disease, stage 3 unspecified: Secondary | ICD-10-CM

## 2023-01-24 DIAGNOSIS — E1151 Type 2 diabetes mellitus with diabetic peripheral angiopathy without gangrene: Secondary | ICD-10-CM | POA: Diagnosis not present

## 2023-01-24 DIAGNOSIS — I152 Hypertension secondary to endocrine disorders: Secondary | ICD-10-CM | POA: Diagnosis not present

## 2023-01-24 DIAGNOSIS — R600 Localized edema: Secondary | ICD-10-CM

## 2023-01-24 NOTE — Progress Notes (Unsigned)
   NURSING HOME LOCATION:  [facility] ROOM NUMBER:  49 W  CODE STATUS:  Full  PCP:  Ok Edwards NP  This is a nursing facility follow up visit of chronic medical diagnoses & to document compliance with Regulation 483.30 (c) in The [place] Manual Phase 2 which mandates caregiver visit ( visits can alternate among physician, PA or NP as per statutes) within 10 days of 30 days / 60 days/ 90 days post admission to SNF date    Interim medical record and care since last SNF visit was updated with review of diagnostic studies and change in clinical status since last visit were documented.  HPI:  Review of systems: Dementia invalidated responses. Date given as   Constitutional: No fever, significant weight change, fatigue  Eyes: No redness, discharge, pain, vision change ENT/mouth: No nasal congestion,  purulent discharge, earache, change in hearing, sore throat  Cardiovascular: No chest pain, palpitations, paroxysmal nocturnal dyspnea, claudication, edema  Respiratory: No cough, sputum production, hemoptysis, DOE, significant snoring, apnea   Gastrointestinal: No heartburn, dysphagia, abdominal pain, nausea /vomiting, rectal bleeding, melena, change in bowels Genitourinary: No dysuria, hematuria, pyuria, incontinence, nocturia Musculoskeletal: No joint stiffness, joint swelling, weakness, pain Dermatologic: No rash, pruritus, change in appearance of skin Neurologic: No dizziness, headache, syncope, seizures, numbness, tingling Psychiatric: No significant anxiety, depression, insomnia, anorexia Endocrine: No change in hair/skin/nails, excessive thirst, excessive hunger, excessive urination  Hematologic/lymphatic: No significant bruising, lymphadenopathy, abnormal bleeding Allergy/immunology: No itchy/watery eyes, significant sneezing, urticaria, angioedema  Physical exam:  Pertinent or positive findings: General appearance: Adequately nourished; no acute  distress, increased work of breathing is present.   Lymphatic: No lymphadenopathy about the head, neck, axilla. Eyes: No conjunctival inflammation or lid edema is present. There is no scleral icterus. Ears:  External ear exam shows no significant lesions or deformities.   Nose:  External nasal examination shows no deformity or inflammation. Nasal mucosa are pink and moist without lesions, exudates Oral exam:  Lips and gums are healthy appearing. There is no oropharyngeal erythema or exudate. Neck:  No thyromegaly, masses, tenderness noted.    Heart:  Normal rate and regular rhythm. S1 and S2 normal without gallop, murmur, click, rub .  Lungs: Chest clear to auscultation without wheezes, rhonchi, rales, rubs. Abdomen: Bowel sounds are normal. Abdomen is soft and nontender with no organomegaly, hernias, masses. GU: Deferred  Extremities:  No cyanosis, clubbing, edema  Neurologic exam : Cn 2-7 intact Strength equal  in upper & lower extremities Balance, Rhomberg, finger to nose testing could not be completed due to clinical state Deep tendon reflexes are equal Skin: Warm & dry w/o tenting. No significant lesions or rash.  See summary under each active problem in the Problem List with associated updated therapeutic plan

## 2023-01-25 DIAGNOSIS — R609 Edema, unspecified: Secondary | ICD-10-CM | POA: Insufficient documentation

## 2023-01-25 NOTE — Assessment & Plan Note (Signed)
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  This is despite high-dose metformin.  No change indicated.

## 2023-01-25 NOTE — Assessment & Plan Note (Addendum)
Serial recorded blood pressures were reviewed.  The blood pressure has been as low as 117/70 and high as 161/77.  She is on low-dose ARB in addition to high-dose amlodipine.  Titration of the ARB will be pursued.

## 2023-01-25 NOTE — Assessment & Plan Note (Addendum)
She is on basal insulin as well as metformin and linagliptin.  Diabetic control is adequate with an A1c of 7.2%.  No change indicated.

## 2023-01-25 NOTE — Assessment & Plan Note (Addendum)
She believes the right lower extremity has been larger than the left since the onset of neuropathic symptoms 3-4 years ago.  Amlodipine should not cause unilateral edema.  Venous Doppler was recently completed and was negative for DVT.

## 2023-01-25 NOTE — Patient Instructions (Signed)
See assessment and plan under each diagnosis in the problem list and acutely for this visit 

## 2023-01-25 NOTE — Assessment & Plan Note (Signed)
There has been slight progression of the anemia with current H/H of 10.8/35.2.  No bleeding dyscrasias reported.  MCV is normal but MCH is mildly reduced.  CBC will be monitored.

## 2023-01-26 ENCOUNTER — Other Ambulatory Visit (HOSPITAL_COMMUNITY)
Admission: RE | Admit: 2023-01-26 | Discharge: 2023-01-26 | Disposition: A | Discharge status: Home / Self Care | Payer: Medicare Other | Source: Skilled Nursing Facility | Attending: Adult Health | Admitting: Adult Health

## 2023-01-26 DIAGNOSIS — R609 Edema, unspecified: Secondary | ICD-10-CM | POA: Insufficient documentation

## 2023-01-26 LAB — D-DIMER, QUANTITATIVE: D-Dimer, Quant: 0.46 ug/mL-FEU (ref 0.00–0.50)

## 2023-02-13 DIAGNOSIS — M2042 Other hammer toe(s) (acquired), left foot: Secondary | ICD-10-CM | POA: Diagnosis not present

## 2023-02-13 DIAGNOSIS — B351 Tinea unguium: Secondary | ICD-10-CM | POA: Diagnosis not present

## 2023-02-13 DIAGNOSIS — Z794 Long term (current) use of insulin: Secondary | ICD-10-CM | POA: Diagnosis not present

## 2023-02-13 DIAGNOSIS — E1140 Type 2 diabetes mellitus with diabetic neuropathy, unspecified: Secondary | ICD-10-CM | POA: Diagnosis not present

## 2023-02-21 ENCOUNTER — Other Ambulatory Visit: Payer: Self-pay | Admitting: Adult Health

## 2023-02-21 MED ORDER — PREGABALIN 25 MG PO CAPS
25.0000 mg | ORAL_CAPSULE | Freq: Every day | ORAL | 0 refills | Status: DC
Start: 2023-02-21 — End: 2023-03-23

## 2023-02-22 ENCOUNTER — Other Ambulatory Visit: Payer: Self-pay | Admitting: Internal Medicine

## 2023-02-22 ENCOUNTER — Non-Acute Institutional Stay (SKILLED_NURSING_FACILITY): Payer: Medicare Other | Admitting: Adult Health

## 2023-02-22 ENCOUNTER — Encounter: Payer: Self-pay | Admitting: Adult Health

## 2023-02-22 DIAGNOSIS — E1159 Type 2 diabetes mellitus with other circulatory complications: Secondary | ICD-10-CM

## 2023-02-22 DIAGNOSIS — G8929 Other chronic pain: Secondary | ICD-10-CM

## 2023-02-22 DIAGNOSIS — I152 Hypertension secondary to endocrine disorders: Secondary | ICD-10-CM

## 2023-02-22 DIAGNOSIS — K219 Gastro-esophageal reflux disease without esophagitis: Secondary | ICD-10-CM | POA: Diagnosis not present

## 2023-02-22 DIAGNOSIS — N3281 Overactive bladder: Secondary | ICD-10-CM

## 2023-02-22 NOTE — Progress Notes (Signed)
Location:  [place] Room Number: [place] of Service:  SNF (31)   CODE STATUS: Full Code   Allergies  Allergen Reactions   Gabapentin    Lisinopril    Penicillins Nausea Only    Has patient had a PCN reaction causing immediate rash, facial/tongue/throat swelling, SOB or lightheadedness with hypotension: Yes Has patient had a PCN reaction causing severe rash involving mucus membranes or skin necrosis: No Has patient had a PCN reaction that required hospitalization No Has patient had a PCN reaction occurring within the last 10 years: No If all of the above answers are "NO", then may proceed with Cephalosporin use.    Sulfa Antibiotics Nausea And Vomiting    Chief Complaint  Patient presents with   Medical Management of Chronic Issues                Hypertension associated with type 2 diabetes mellitus:  Gastroesophageal reflux disease without esophagitis:  OAB: Chronic generalized pain     HPI:  She is a 82 year old long term resident of this facility being seen for the management of her chronic illnesses: Hypertension associated with type 2 diabetes mellitus:  Gastroesophageal reflux disease without esophagitis:  OAB: Chronic generalized pain.  She is stating that she has tongue numbness. She denies any uncontrolled pain; no changes in appetite; no reports of heart burn present.   Past Medical History:  Diagnosis Date   Anxiety    Bowel obstruction ([place])    Burning with urination 01/04/2016   Diabetes mellitus with neuropathy    Genital herpes    GERD (gastroesophageal reflux disease)    Hematuria 01/04/2016   Herpes 12/22/2015   Hyperlipidemia    Hypertension    LLQ abdominal tenderness 01/04/2016   Neuropathy    Pelvic pressure in female 01/04/2016   Recurrent UTI    Sciatic nerve disease    Small bowel obstruction ([place]) 04/27/2016   Urinary frequency 01/04/2016   Urticaria     Past Surgical History:  Procedure Laterality Date    ABDOMINAL HYSTERECTOMY     APPLICATION OF INTRAOPERATIVE CT SCAN N/A 12/19/2018   Procedure: APPLICATION OF INTRAOPERATIVE CT SCAN;  Surgeon: Erline Levine, MD;  Location: [place];  Service: Neurosurgery;  Laterality: N/A;   bowel obstruction     COLON SURGERY     blocked colon   POSTERIOR CERVICAL FUSION/FORAMINOTOMY N/A 12/19/2018   Procedure: Cervical Seven to Thoracic One Posterior cervical fusion;  Surgeon: Erline Levine, MD;  Location: [place];  Service: Neurosurgery;  Laterality: N/A;  Cervical Seven to Thoracic One Posterior cervical fusion    Social History   Socioeconomic History   Marital status: Widowed    Spouse name: Not on file   Number of children: Not on file   Years of education: Not on file   Highest education level: Not on file  Occupational History   Occupation: retired   Tobacco Use   Smoking status: Never   Smokeless tobacco: Current    Types: Snuff  Vaping Use   Vaping Use: Never used  Substance and Sexual Activity   Alcohol use: No   Drug use: No   Sexual activity: Not Currently    Birth control/protection: Surgical    Comment: hyst  Other Topics Concern   Not on file  Social History Narrative   Long term resident of [facility]    Social Determinants of Health   Financial Resource Strain: Not on file  Food Insecurity: Not on file  Transportation Needs: Not on file  Physical Activity: Not on file  Stress: Not on file  Social Connections: Not on file  Intimate Partner Violence: Not on file   Family History  Problem Relation Age of Onset   Diabetes Mother    Heart disease Brother    Hypertension Brother    Diabetes Brother    Hypertension Daughter    Diabetes Brother    Diabetes Brother    Alzheimer's disease Brother    Anxiety disorder Daughter       VITAL SIGNS BP (!) 148/80   Pulse 72   Temp 98.3 F (36.8 C)   Resp 18   Ht 5' (1.524 m)   Wt 183 lb (83 kg)   SpO2 98%   BMI 35.74 kg/m   Outpatient Encounter Medications as of 02/22/2023   Medication Sig   acetaminophen (TYLENOL) 650 MG CR tablet Take 650 mg by mouth 3 (three) times daily.   amLODipine (NORVASC) 10 MG tablet Take 1 tablet (10 mg total) by mouth daily.   aspirin 81 MG chewable tablet Chew 81 mg by mouth daily.   busPIRone (BUSPAR) 5 MG tablet Take 5 mg by mouth 3 (three) times daily.   celecoxib (CELEBREX) 100 MG capsule Take 1 capsule by mouth 2 (two) times daily. For back pain. Give with food.   Cranberry 500 MG CAPS Take by mouth in the morning and at bedtime. For UTI prevention   cyanocobalamin 1000 MCG tablet Take 1,000 mcg by mouth daily. Special Instructions: for vit B 12 deficiency   Dextromethorphan-guaiFENesin 10-100 MG/5ML liquid Take 10 mLs by mouth at bedtime as needed.   DULoxetine (CYMBALTA) 60 MG capsule Take 60 mg by mouth daily. Pain management   Insulin Glargine (BASAGLAR KWIKPEN) 100 UNIT/ML Inject 10 Units into the skin at bedtime.   lamoTRIgine (LAMICTAL) 25 MG tablet Take 25 mg by mouth daily.   linagliptin (TRADJENTA) 5 MG TABS tablet Take 5 mg by mouth daily.   loratadine (CLARITIN) 10 MG tablet Take 10 mg by mouth daily.   losartan (COZAAR) 25 MG tablet Take 12.5 mg by mouth daily.   Melatonin 5 MG TABS Take 5 mg by mouth at bedtime.   Menthol, Topical Analgesic, (BIOFREEZE) 4 % GEL Apply topically. Every Shift - PRN   metFORMIN (GLUCOPHAGE) 1000 MG tablet Take 1,000 mg by mouth 2 (two) times daily with a meal.    mirabegron ER (MYRBETRIQ) 50 MG TB24 tablet Take 50 mg by mouth daily.   montelukast (SINGULAIR) 10 MG tablet Take 1 tablet (10 mg total) by mouth at bedtime.   NON FORMULARY Diet: Regular diet   omeprazole (PRILOSEC) 20 MG capsule Take 20 mg by mouth daily. Special Instructions:  *TAKE ON AN EMPTY STOMACH* *DO NOT CRUSH*   polyethylene glycol (MIRALAX / GLYCOLAX) packet Take 17 g by mouth daily.   pregabalin (LYRICA) 25 MG capsule Take 1 capsule (25 mg total) by mouth at bedtime.   PRESCRIPTION MEDICATION MICROGUARD POWDER   powder; amt: SPARINGLY; topical Special Instructions: REDNESS UNDER BREAST Once A Day   senna-docusate (SENOKOT-S) 8.6-50 MG tablet Take 1 tablet by mouth at bedtime.    sertraline (ZOLOFT) 50 MG tablet Take 50 mg by mouth daily.   simvastatin (ZOCOR) 10 MG tablet Take 10 mg by mouth at bedtime.    triamcinolone (NASACORT) 55 MCG/ACT AERO nasal inhaler Place 1 spray into the nose daily.   valACYclovir (VALTREX) 500 MG tablet  Take 500 mg by mouth daily.    [DISCONTINUED] PRESCRIPTION MEDICATION annusol cream  cream; amt: sparingly; rectal Special Instructions: hemmorhoids Once A Day - PRN   No facility-administered encounter medications on file as of 02/22/2023.     SIGNIFICANT DIAGNOSTIC EXAMS  PREVIOUS   07-27-22: dexa t score: 0.636  NO NEW EXAMS    LABS REVIEWED PREVIOUS;    04-07-22: wbc 10.9; hgb 11.1; hct 35.0; mcv 82.4 plt 233; glucose 147; bun 18; creat 0.88; k+ 3.6; na++ 139; ca 87; gfr >60; protein 6.5; albumin 3.6 chol 134; ldl 49; trig 169; hdl 51; urine micro-albumin 26.2    06-30-22: glucose 150; bun 17; creat 0.81; k+ 3.8; na++ 138; ca 9.1 gfr >60 07-02-22: wbc 10.5; hgb 11.1; hct 35.4; mcv 82.5 plt 230; glucose 138; bun 18; creat 0.98; k+ 4.2; na++ 137; ca 8.9; gfr >60;  07-08-22: hgb a1c 7.8  09-26-22: hgb a1c: 8.0  TODAY  12-29-22: wbc 10.0; hgb 10.8; hct 35.2 mcv 82.6 plt 212; glucose 131; bun 21; creat 0.95; k+ 3.6; na++ 136; ca 8.6; gfr >60; protein 6.6 albumin 3.6; hgb A1c 7.2; chol 132; ldl 50; trig 134; hdl 55 01-26-23: d-dimer: 0.46    Review of Systems  Constitutional:  Negative for malaise/fatigue.  Respiratory:  Negative for cough and shortness of breath.   Cardiovascular:  Negative for chest pain, palpitations and leg swelling.  Gastrointestinal:  Negative for abdominal pain, constipation and heartburn.  Musculoskeletal:  Negative for back pain, joint pain and myalgias.  Skin: Negative.   Neurological:  Negative for dizziness.       Tongue  numbness Numbness and tingling right leg  Psychiatric/Behavioral:  The patient is not nervous/anxious.    Physical Exam Constitutional:      General: She is not in acute distress.    Appearance: She is well-developed. She is obese. She is not diaphoretic.  Neck:     Thyroid: No thyromegaly.  Cardiovascular:     Rate and Rhythm: Normal rate and regular rhythm.     Pulses: Normal pulses.     Heart sounds: Normal heart sounds.  Pulmonary:     Effort: Pulmonary effort is normal. No respiratory distress.     Breath sounds: Normal breath sounds.  Abdominal:     General: Bowel sounds are normal. There is no distension.     Palpations: Abdomen is soft.     Tenderness: There is no abdominal tenderness.  Musculoskeletal:        General: Normal range of motion.     Cervical back: Neck supple.     Right lower leg: No edema.     Left lower leg: No edema.  Lymphadenopathy:     Cervical: No cervical adenopathy.  Skin:    General: Skin is warm and dry.  Neurological:     Mental Status: She is alert and oriented to person, place, and time.  Psychiatric:        Mood and Affect: Mood normal.         ASSESSMENT/ PLAN:  TODAY  Hypertension associated with type 2 diabetes mellitus: b/p 148/80 will continue norvasc 10 mg daily asa 81 mg daily. Will stop arb due to numb tongue; will begin coreg 3.125 mg twice daily and chlorthalidone 12.5 mg daily will follow up with bmp   2. Gastroesophageal reflux disease without esophagitis: will continue prilosec 20 mg daily   3. OAB: will continue myrbetriq 50 mg daily   4. Chronic generalized pain/left sciatic  pain: will continue tylenol 650 mg three times daily celebrex 100 mg twice daily lyrica 25 mg nightly and cymbalta 60 mg daily   PREVIOUS   5. Chronic non-seasonal allergic rhinitis: will continue claritin  10 mg daily and singulair 10 mg daily and nasocort daily   6. Herpes: no recent outbreaks will continue valtrex 500 mg daily   7.  Vitamin B 12 deficiency level is 1068 will stop supplement   8. Chronic constipation: will continue miralax daily senna s daily  9. Major depression recurrent chronic; will continue cymbalta 60 mg daily (failed one wean; also takes for pain); lamictal 25 mg twice daily for mood; melatonin 5 mg nightly for sleep. Zoloft 50 mg daily and buspar 5 mg three times daily   10. CKD stage 3 due to type 2 diabetes mellitus: bun 21; creat 0.95; gfr >60  11. Normocytic anemia: hgb 10.8 hct 35.2  12. Dyslipidemia associated with type 2 diabetes mellitus LDL is 55 will continue zocor 10 mg daily   13 aortic atherosclerosis (ct 12-18-19) is on asa and statin  14. Type 2 diabetes mellitus with peripheral angiopathy: hgb A1c 7.2; will continue metformin 1 gm twice daily tradjenta 5 mg daily semglee 10 units nightly is on asa and statin;   Ok Edwards NP [facility]  call [phone]

## 2023-02-27 ENCOUNTER — Non-Acute Institutional Stay (SKILLED_NURSING_FACILITY): Payer: Medicare Other | Admitting: Adult Health

## 2023-02-27 ENCOUNTER — Encounter: Payer: Self-pay | Admitting: Adult Health

## 2023-02-27 DIAGNOSIS — M25551 Pain in right hip: Secondary | ICD-10-CM

## 2023-03-01 ENCOUNTER — Encounter: Payer: Self-pay | Admitting: Adult Health

## 2023-03-01 NOTE — Progress Notes (Signed)
[place]  Location:  [place] Room Number: 115 Place of Service:  SNF (31)   CODE STATUS: full  Allergies  Allergen Reactions   Gabapentin    Lisinopril    Penicillins Nausea Only    Has patient had a PCN reaction causing immediate rash, facial/tongue/throat swelling, SOB or lightheadedness with hypotension: Yes Has patient had a PCN reaction causing severe rash involving mucus membranes or skin necrosis: No Has patient had a PCN reaction that required hospitalization No Has patient had a PCN reaction occurring within the last 10 years: No If all of the above answers are "NO", then may proceed with Cephalosporin use.    Sulfa Antibiotics Nausea And Vomiting    Chief Complaint  Patient presents with   Acute Visit    Right hip pain     HPI:    Past Medical History:  Diagnosis Date   Anxiety    Bowel obstruction ([place])    Burning with urination 01/04/2016   Diabetes mellitus with neuropathy    Genital herpes    GERD (gastroesophageal reflux disease)    Hematuria 01/04/2016   Herpes 12/22/2015   Hyperlipidemia    Hypertension    LLQ abdominal tenderness 01/04/2016   Neuropathy    Pelvic pressure in female 01/04/2016   Recurrent UTI    Sciatic nerve disease    Small bowel obstruction ([place]) 04/27/2016   Urinary frequency 01/04/2016   Urticaria     Past Surgical History:  Procedure Laterality Date   ABDOMINAL HYSTERECTOMY     APPLICATION OF INTRAOPERATIVE CT SCAN N/A 12/19/2018   Procedure: APPLICATION OF INTRAOPERATIVE CT SCAN;  Surgeon: Erline Levine, MD;  Location: [place];  Service: Neurosurgery;  Laterality: N/A;   bowel obstruction     COLON SURGERY     blocked colon   POSTERIOR CERVICAL FUSION/FORAMINOTOMY N/A 12/19/2018   Procedure: Cervical Seven to Thoracic One Posterior cervical fusion;  Surgeon: Erline Levine, MD;  Location: [place];  Service: Neurosurgery;  Laterality: N/A;  Cervical Seven to Thoracic One Posterior cervical fusion     Social History   Socioeconomic History   Marital status: Widowed    Spouse name: Not on file   Number of children: Not on file   Years of education: Not on file   Highest education level: Not on file  Occupational History   Occupation: retired   Tobacco Use   Smoking status: Never   Smokeless tobacco: Current    Types: Snuff  Vaping Use   Vaping Use: Never used  Substance and Sexual Activity   Alcohol use: No   Drug use: No   Sexual activity: Not Currently    Birth control/protection: Surgical    Comment: hyst  Other Topics Concern   Not on file  Social History Narrative   Long term resident of [facility]    Social Determinants of Health   Financial Resource Strain: Not on file  Food Insecurity: Not on file  Transportation Needs: Not on file  Physical Activity: Not on file  Stress: Not on file  Social Connections: Not on file  Intimate Partner Violence: Not on file   Family History  Problem Relation Age of Onset   Diabetes Mother    Heart disease Brother    Hypertension Brother    Diabetes Brother    Hypertension Daughter    Diabetes Brother    Diabetes Brother    Alzheimer's disease Brother    Anxiety disorder Daughter  VITAL SIGNS BP 138/80   Pulse 84   Temp 97.7 F (36.5 C)   Resp 18   Ht 5' (1.524 m)   Wt 183 lb 12.8 oz (83.4 kg)   SpO2 96%   BMI 35.90 kg/m   Outpatient Encounter Medications as of 02/27/2023  Medication Sig   acetaminophen (TYLENOL) 650 MG CR tablet Take 650 mg by mouth 3 (three) times daily.   amLODipine (NORVASC) 10 MG tablet Take 1 tablet (10 mg total) by mouth daily.   aspirin 81 MG chewable tablet Chew 81 mg by mouth daily.   busPIRone (BUSPAR) 5 MG tablet Take 5 mg by mouth 3 (three) times daily.   celecoxib (CELEBREX) 100 MG capsule Take 1 capsule by mouth 2 (two) times daily. For back pain. Give with food.   Cranberry 500 MG CAPS Take by mouth in the morning and at bedtime. For UTI prevention   cyanocobalamin 1000  MCG tablet Take 1,000 mcg by mouth daily. Special Instructions: for vit B 12 deficiency   Dextromethorphan-guaiFENesin 10-100 MG/5ML liquid Take 10 mLs by mouth at bedtime as needed.   DULoxetine (CYMBALTA) 60 MG capsule Take 60 mg by mouth daily. Pain management   Insulin Glargine (BASAGLAR KWIKPEN) 100 UNIT/ML Inject 10 Units into the skin at bedtime.   lamoTRIgine (LAMICTAL) 25 MG tablet Take 25 mg by mouth daily.   linagliptin (TRADJENTA) 5 MG TABS tablet Take 5 mg by mouth daily.   loratadine (CLARITIN) 10 MG tablet Take 10 mg by mouth daily.   losartan (COZAAR) 25 MG tablet Take 12.5 mg by mouth daily.   Melatonin 5 MG TABS Take 5 mg by mouth at bedtime.   Menthol, Topical Analgesic, (BIOFREEZE) 4 % GEL Apply topically. Every Shift - PRN   metFORMIN (GLUCOPHAGE) 1000 MG tablet Take 1,000 mg by mouth 2 (two) times daily with a meal.    mirabegron ER (MYRBETRIQ) 50 MG TB24 tablet Take 50 mg by mouth daily.   montelukast (SINGULAIR) 10 MG tablet Take 1 tablet (10 mg total) by mouth at bedtime.   NON FORMULARY Diet: Regular diet   omeprazole (PRILOSEC) 20 MG capsule Take 20 mg by mouth daily. Special Instructions:  *TAKE ON AN EMPTY STOMACH* *DO NOT CRUSH*   polyethylene glycol (MIRALAX / GLYCOLAX) packet Take 17 g by mouth daily.   pregabalin (LYRICA) 25 MG capsule Take 1 capsule (25 mg total) by mouth at bedtime.   PRESCRIPTION MEDICATION MICROGUARD POWDER  powder; amt: SPARINGLY; topical Special Instructions: REDNESS UNDER BREAST Once A Day   senna-docusate (SENOKOT-S) 8.6-50 MG tablet Take 1 tablet by mouth at bedtime.    sertraline (ZOLOFT) 50 MG tablet Take 50 mg by mouth daily.   simvastatin (ZOCOR) 10 MG tablet Take 10 mg by mouth at bedtime.    triamcinolone (NASACORT) 55 MCG/ACT AERO nasal inhaler Place 1 spray into the nose daily.   valACYclovir (VALTREX) 500 MG tablet Take 500 mg by mouth daily.    No facility-administered encounter medications on file as of 02/27/2023.      SIGNIFICANT DIAGNOSTIC EXAMS   PREVIOUS   07-27-22: dexa t score: 0.636  NO NEW EXAMS    LABS REVIEWED PREVIOUS;    04-07-22: wbc 10.9; hgb 11.1; hct 35.0; mcv 82.4 plt 233; glucose 147; bun 18; creat 0.88; k+ 3.6; na++ 139; ca 87; gfr >60; protein 6.5; albumin 3.6 chol 134; ldl 49; trig 169; hdl 51; urine micro-albumin 26.2    06-30-22: glucose 150; bun 17; creat  0.81; k+ 3.8; na++ 138; ca 9.1 gfr >60 07-02-22: wbc 10.5; hgb 11.1; hct 35.4; mcv 82.5 plt 230; glucose 138; bun 18; creat 0.98; k+ 4.2; na++ 137; ca 8.9; gfr >60;  07-08-22: hgb a1c 7.8  09-26-22: hgb a1c: 8.0  TODAY  12-29-22: wbc 10.0; hgb 10.8; hct 35.2 mcv 82.6 plt 212; glucose 131; bun 21; creat 0.95; k+ 3.6; na++ 136; ca 8.6; gfr >60; protein 6.6 albumin 3.6; hgb A1c 7.2; chol 132; ldl 50; trig 134; hdl 55 01-26-23: d-dimer: 0.46   Review of Systems  Constitutional:  Negative for malaise/fatigue.  Respiratory:  Negative for cough and shortness of breath.   Cardiovascular:  Negative for chest pain, palpitations and leg swelling.  Gastrointestinal:  Negative for abdominal pain, constipation and heartburn.  Musculoskeletal:  Positive for joint pain. Negative for back pain and myalgias.  Skin: Negative.   Neurological:  Negative for dizziness.  Psychiatric/Behavioral:  The patient is not nervous/anxious.     Physical Exam Constitutional:      General: She is not in acute distress.    Appearance: She is well-developed. She is obese. She is not diaphoretic.  Neck:     Thyroid: No thyromegaly.  Cardiovascular:     Rate and Rhythm: Normal rate and regular rhythm.     Pulses: Normal pulses.     Heart sounds: Normal heart sounds.  Pulmonary:     Effort: Pulmonary effort is normal. No respiratory distress.     Breath sounds: Normal breath sounds.  Abdominal:     General: Bowel sounds are normal. There is no distension.     Palpations: Abdomen is soft.     Tenderness: There is no abdominal tenderness.   Musculoskeletal:        General: Normal range of motion.     Cervical back: Neck supple.     Right lower leg: No edema.     Left lower leg: No edema.     Comments: Does ambulate with a walker   Lymphadenopathy:     Cervical: No cervical adenopathy.  Skin:    General: Skin is warm and dry.  Neurological:     Mental Status: She is alert and oriented to person, place, and time.  Psychiatric:        Mood and Affect: Mood normal.      ASSESSMENT/ PLAN:  TODAY  Acute right hip pain: will have OT to see for pain management. Will change celebrex to mobic 15 mg daily. Will get xr of right hip; will begin prednisone 40 mg daily for 5 days.    Ok Edwards NP [facility]   call [phone]

## 2023-03-02 ENCOUNTER — Other Ambulatory Visit (HOSPITAL_COMMUNITY)
Admission: RE | Admit: 2023-03-02 | Discharge: 2023-03-02 | Disposition: A | Discharge status: Home / Self Care | Payer: Medicare Other | Source: Ambulatory Visit | Attending: Adult Health | Admitting: Adult Health

## 2023-03-02 ENCOUNTER — Encounter: Payer: Self-pay | Admitting: Adult Health

## 2023-03-02 DIAGNOSIS — I152 Hypertension secondary to endocrine disorders: Secondary | ICD-10-CM | POA: Insufficient documentation

## 2023-03-02 LAB — BASIC METABOLIC PANEL
Anion gap: 13 (ref 5–15)
BUN: 23 mg/dL (ref 8–23)
CO2: 26 mmol/L (ref 22–32)
Calcium: 9.1 mg/dL (ref 8.9–10.3)
Chloride: 95 mmol/L — ABNORMAL LOW (ref 98–111)
Creatinine, Ser: 0.83 mg/dL (ref 0.44–1.00)
GFR, Estimated: >60 mL/min (ref >60)
Glucose, Bld: 133 mg/dL — ABNORMAL HIGH (ref 70–99)
Potassium: 3.5 mmol/L (ref 3.5–5.1)
Sodium: 134 mmol/L — ABNORMAL LOW (ref 135–145)

## 2023-03-02 NOTE — Progress Notes (Signed)
Location:  [place] Room Number: 115 Place of Service:  SNF (31)   CODE STATUS:   Allergies  Allergen Reactions  . Gabapentin   . Lisinopril   . Penicillins Nausea Only    Has patient had a PCN reaction causing immediate rash, facial/tongue/throat swelling, SOB or lightheadedness with hypotension: Yes Has patient had a PCN reaction causing severe rash involving mucus membranes or skin necrosis: No Has patient had a PCN reaction that required hospitalization No Has patient had a PCN reaction occurring within the last 10 years: No If all of the above answers are "NO", then may proceed with Cephalosporin use.   . Sulfa Antibiotics Nausea And Vomiting    Chief Complaint  Patient presents with  . Acute Visit    Right hip pain     HPI:    Past Medical History:  Diagnosis Date  . Anxiety   . Bowel obstruction ([place])   . Burning with urination 01/04/2016  . Diabetes mellitus with neuropathy   . Genital herpes   . GERD (gastroesophageal reflux disease)   . Hematuria 01/04/2016  . Herpes 12/22/2015  . Hyperlipidemia   . Hypertension   . LLQ abdominal tenderness 01/04/2016  . Neuropathy   . Pelvic pressure in female 01/04/2016  . Recurrent UTI   . Sciatic nerve disease   . Small bowel obstruction ([place]) 04/27/2016  . Urinary frequency 01/04/2016  . Urticaria     Past Surgical History:  Procedure Laterality Date  . ABDOMINAL HYSTERECTOMY    . APPLICATION OF INTRAOPERATIVE CT SCAN N/A 12/19/2018   Procedure: APPLICATION OF INTRAOPERATIVE CT SCAN;  Surgeon: Erline Levine, MD;  Location: [place];  Service: Neurosurgery;  Laterality: N/A;  . bowel obstruction    . COLON SURGERY     blocked colon  . POSTERIOR CERVICAL FUSION/FORAMINOTOMY N/A 12/19/2018   Procedure: Cervical Seven to Thoracic One Posterior cervical fusion;  Surgeon: Erline Levine, MD;  Location: [place];  Service: Neurosurgery;  Laterality: N/A;  Cervical Seven to Thoracic One Posterior  cervical fusion    Social History   Socioeconomic History  . Marital status: Widowed    Spouse name: Not on file  . Number of children: Not on file  . Years of education: Not on file  . Highest education level: Not on file  Occupational History  . Occupation: retired   Tobacco Use  . Smoking status: Never  . Smokeless tobacco: Current    Types: Snuff  Vaping Use  . Vaping Use: Never used  Substance and Sexual Activity  . Alcohol use: No  . Drug use: No  . Sexual activity: Not Currently    Birth control/protection: Surgical    Comment: hyst  Other Topics Concern  . Not on file  Social History Narrative   Long term resident of [facility]    Social Determinants of Health   Financial Resource Strain: Not on file  Food Insecurity: Not on file  Transportation Needs: Not on file  Physical Activity: Not on file  Stress: Not on file  Social Connections: Not on file  Intimate Partner Violence: Not on file   Family History  Problem Relation Age of Onset  . Diabetes Mother   . Heart disease Brother   . Hypertension Brother   . Diabetes Brother   . Hypertension Daughter   . Diabetes Brother   . Diabetes Brother   . Alzheimer's disease Brother   . Anxiety disorder Daughter  VITAL SIGNS BP 138/80   Pulse 84   Temp 97.7 F (36.5 C)   Resp 18   Ht 5' (1.524 m)   Wt 183 lb 12.8 oz (83.4 kg)   SpO2 96%   BMI 35.90 kg/m   Outpatient Encounter Medications as of 02/27/2023  Medication Sig  . acetaminophen (TYLENOL) 650 MG CR tablet Take 650 mg by mouth 3 (three) times daily.  Marland Kitchen amLODipine (NORVASC) 10 MG tablet Take 1 tablet (10 mg total) by mouth daily.  Marland Kitchen aspirin 81 MG chewable tablet Chew 81 mg by mouth daily.  . busPIRone (BUSPAR) 5 MG tablet Take 5 mg by mouth 3 (three) times daily.  . celecoxib (CELEBREX) 100 MG capsule Take 1 capsule by mouth 2 (two) times daily. For back pain. Give with food.  . Cranberry 500 MG CAPS Take by mouth in the morning and at  bedtime. For UTI prevention  . cyanocobalamin 1000 MCG tablet Take 1,000 mcg by mouth daily. Special Instructions: for vit B 12 deficiency  . Dextromethorphan-guaiFENesin 10-100 MG/5ML liquid Take 10 mLs by mouth at bedtime as needed.  . DULoxetine (CYMBALTA) 60 MG capsule Take 60 mg by mouth daily. Pain management  . Insulin Glargine (BASAGLAR KWIKPEN) 100 UNIT/ML Inject 10 Units into the skin at bedtime.  . lamoTRIgine (LAMICTAL) 25 MG tablet Take 25 mg by mouth daily.  Marland Kitchen linagliptin (TRADJENTA) 5 MG TABS tablet Take 5 mg by mouth daily.  Marland Kitchen loratadine (CLARITIN) 10 MG tablet Take 10 mg by mouth daily.  Marland Kitchen losartan (COZAAR) 25 MG tablet Take 12.5 mg by mouth daily.  . Melatonin 5 MG TABS Take 5 mg by mouth at bedtime.  . Menthol, Topical Analgesic, (BIOFREEZE) 4 % GEL Apply topically. Every Shift - PRN  . metFORMIN (GLUCOPHAGE) 1000 MG tablet Take 1,000 mg by mouth 2 (two) times daily with a meal.   . mirabegron ER (MYRBETRIQ) 50 MG TB24 tablet Take 50 mg by mouth daily.  . montelukast (SINGULAIR) 10 MG tablet Take 1 tablet (10 mg total) by mouth at bedtime.  . NON FORMULARY Diet: Regular diet  . omeprazole (PRILOSEC) 20 MG capsule Take 20 mg by mouth daily. Special Instructions:  *TAKE ON AN EMPTY STOMACH* *DO NOT CRUSH*  . polyethylene glycol (MIRALAX / GLYCOLAX) packet Take 17 g by mouth daily.  . pregabalin (LYRICA) 25 MG capsule Take 1 capsule (25 mg total) by mouth at bedtime.  Marland Kitchen PRESCRIPTION MEDICATION MICROGUARD POWDER  powder; amt: SPARINGLY; topical Special Instructions: REDNESS UNDER BREAST Once A Day  . senna-docusate (SENOKOT-S) 8.6-50 MG tablet Take 1 tablet by mouth at bedtime.   . sertraline (ZOLOFT) 50 MG tablet Take 50 mg by mouth daily.  . simvastatin (ZOCOR) 10 MG tablet Take 10 mg by mouth at bedtime.   . triamcinolone (NASACORT) 55 MCG/ACT AERO nasal inhaler Place 1 spray into the nose daily.  . valACYclovir (VALTREX) 500 MG tablet Take 500 mg by mouth daily.    No  facility-administered encounter medications on file as of 02/27/2023.     SIGNIFICANT DIAGNOSTIC EXAMS       ASSESSMENT/ PLAN:     Ok Edwards NP [facility]  call [phone]   This encounter was created in error - please disregard.

## 2023-03-03 ENCOUNTER — Encounter: Payer: Self-pay | Admitting: Adult Health

## 2023-03-03 ENCOUNTER — Non-Acute Institutional Stay (SKILLED_NURSING_FACILITY): Payer: Medicare Other | Admitting: Adult Health

## 2023-03-03 DIAGNOSIS — E1159 Type 2 diabetes mellitus with other circulatory complications: Secondary | ICD-10-CM

## 2023-03-03 DIAGNOSIS — E1122 Type 2 diabetes mellitus with diabetic chronic kidney disease: Secondary | ICD-10-CM | POA: Diagnosis not present

## 2023-03-03 DIAGNOSIS — N183 Chronic kidney disease, stage 3 unspecified: Secondary | ICD-10-CM | POA: Diagnosis not present

## 2023-03-03 DIAGNOSIS — I700 Atherosclerosis of aorta: Secondary | ICD-10-CM

## 2023-03-03 DIAGNOSIS — E1151 Type 2 diabetes mellitus with diabetic peripheral angiopathy without gangrene: Secondary | ICD-10-CM

## 2023-03-03 DIAGNOSIS — I152 Hypertension secondary to endocrine disorders: Secondary | ICD-10-CM

## 2023-03-03 NOTE — Progress Notes (Unsigned)
Location:  [place] Room Number: [id number] Place of Service:  SNF (31)   CODE STATUS: Full Code  Allergies  Allergen Reactions   Gabapentin    Lisinopril    Penicillins Nausea Only    Has patient had a PCN reaction causing immediate rash, facial/tongue/throat swelling, SOB or lightheadedness with hypotension: Yes Has patient had a PCN reaction causing severe rash involving mucus membranes or skin necrosis: No Has patient had a PCN reaction that required hospitalization No Has patient had a PCN reaction occurring within the last 10 years: No If all of the above answers are "NO", then may proceed with Cephalosporin use.    Sulfa Antibiotics Nausea And Vomiting    Chief Complaint  Patient presents with   Acute Visit    Care Plan Meeting     HPI:  We have come together for her care plan meeting. BIMS 12/15 mood 6/30: decreased energy; nervous at times. She is ambulatory with no falls. She is independent with her adl care. She is occasionally incontinent of bladder and bowel. Dietary weight is 183.8 pounds; CON CHO appetite 75-100%. Feeds self. Therapy: none at this time. Activities: [employer] arts/crafts.  She had had hip pain which is being treated. 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   Past Medical History:  Diagnosis Date   Anxiety    Bowel obstruction ([place])    Burning with urination 01/04/2016   Diabetes mellitus with neuropathy    Genital herpes    GERD (gastroesophageal reflux disease)    Hematuria 01/04/2016   Herpes 12/22/2015   Hyperlipidemia    Hypertension    LLQ abdominal tenderness 01/04/2016   Neuropathy    Pelvic pressure in female 01/04/2016   Recurrent UTI    Sciatic nerve disease    Small bowel obstruction ([place]) 04/27/2016   Urinary  frequency 01/04/2016   Urticaria     Past Surgical History:  Procedure Laterality Date   ABDOMINAL HYSTERECTOMY     APPLICATION OF INTRAOPERATIVE CT SCAN N/A 12/19/2018   Procedure: APPLICATION OF INTRAOPERATIVE CT SCAN;  Surgeon: Erline Levine, MD;  Location: [place];  Service: Neurosurgery;  Laterality: N/A;   bowel obstruction     COLON SURGERY     blocked colon   POSTERIOR CERVICAL FUSION/FORAMINOTOMY N/A 12/19/2018   Procedure: Cervical Seven to Thoracic One Posterior cervical fusion;  Surgeon: Erline Levine, MD;  Location: [place];  Service: Neurosurgery;  Laterality: N/A;  Cervical Seven to Thoracic One Posterior cervical fusion    Social History   Socioeconomic History   Marital status: Widowed    Spouse name: Not on file   Number of children: Not on file   Years of education: Not on file   Highest education level: Not on file  Occupational History   Occupation: retired   Tobacco Use   Smoking status: Never   Smokeless tobacco: Current    Types: Snuff  Vaping Use   Vaping Use: Never used  Substance and Sexual Activity   Alcohol use: No   Drug use: No   Sexual activity: Not Currently    Birth control/protection: Surgical    Comment: hyst  Other Topics Concern   Not on file  Social History Narrative   Long term resident of [facility]    Social Determinants of Health  Financial Resource Strain: Not on file  Food Insecurity: Not on file  Transportation Needs: Not on file  Physical Activity: Not on file  Stress: Not on file  Social Connections: Not on file  Intimate Partner Violence: Not on file   Family History  Problem Relation Age of Onset   Diabetes Mother    Heart disease Brother    Hypertension Brother    Diabetes Brother    Hypertension Daughter    Diabetes Brother    Diabetes Brother    Alzheimer's disease Brother    Anxiety disorder Daughter       VITAL SIGNS BP 138/79   Pulse 84   Temp 97.7 F (36.5 C) (Temporal)   Resp 18   Ht 5' (1.524 m)    Wt 183 lb 12.8 oz (83.4 kg)   SpO2 96%   BMI 35.90 kg/m   Outpatient Encounter Medications as of 03/03/2023  Medication Sig   acetaminophen (TYLENOL) 650 MG CR tablet Take 650 mg by mouth 3 (three) times daily.   amLODipine (NORVASC) 10 MG tablet Take 1 tablet (10 mg total) by mouth daily.   aspirin 81 MG chewable tablet Chew 81 mg by mouth daily.   busPIRone (BUSPAR) 5 MG tablet Take 5 mg by mouth 3 (three) times daily.   carvedilol (COREG) 3.125 MG tablet Take 3.125 mg by mouth 2 (two) times daily with a meal.   chlorthalidone (HYGROTON) 25 MG tablet Take 25 mg by mouth daily.   Cranberry 500 MG CAPS Take by mouth in the morning and at bedtime. For UTI prevention   Dextromethorphan-guaiFENesin 10-100 MG/5ML liquid Take 10 mLs by mouth at bedtime as needed.   DULoxetine (CYMBALTA) 60 MG capsule Take 60 mg by mouth daily. Pain management   Insulin Glargine (BASAGLAR KWIKPEN) 100 UNIT/ML Inject 10 Units into the skin at bedtime.   lamoTRIgine (LAMICTAL) 25 MG tablet Take 25 mg by mouth daily.   linagliptin (TRADJENTA) 5 MG TABS tablet Take 5 mg by mouth daily.   loratadine (CLARITIN) 10 MG tablet Take 10 mg by mouth daily.   Melatonin 5 MG TABS Take 5 mg by mouth at bedtime.   Menthol, Topical Analgesic, (BIOFREEZE) 4 % GEL Apply topically. Every Shift - PRN   metFORMIN (GLUCOPHAGE) 1000 MG tablet Take 1,000 mg by mouth 2 (two) times daily with a meal.    mirabegron ER (MYRBETRIQ) 50 MG TB24 tablet Take 50 mg by mouth daily.   montelukast (SINGULAIR) 10 MG tablet Take 1 tablet (10 mg total) by mouth at bedtime.   NON FORMULARY Diet: Regular diet   omeprazole (PRILOSEC) 20 MG capsule Take 20 mg by mouth daily. Special Instructions:  *TAKE ON AN EMPTY STOMACH* *DO NOT CRUSH*   polyethylene glycol (MIRALAX / GLYCOLAX) packet Take 17 g by mouth daily.   pregabalin (LYRICA) 25 MG capsule Take 1 capsule (25 mg total) by mouth at bedtime.   PRESCRIPTION MEDICATION MICROGUARD POWDER  powder; amt:  SPARINGLY; topical Special Instructions: REDNESS UNDER BREAST Once A Day   senna-docusate (SENOKOT-S) 8.6-50 MG tablet Take 1 tablet by mouth at bedtime.    sertraline (ZOLOFT) 50 MG tablet Take 50 mg by mouth daily.   simvastatin (ZOCOR) 10 MG tablet Take 10 mg by mouth at bedtime.    triamcinolone (NASACORT) 55 MCG/ACT AERO nasal inhaler Place 1 spray into the nose daily.   valACYclovir (VALTREX) 500 MG tablet Take 500 mg by mouth daily.    celecoxib (CELEBREX) 100 MG  capsule Take 1 capsule by mouth 2 (two) times daily. For back pain. Give with food. (Patient not taking: Reported on 03/03/2023)   cyanocobalamin 1000 MCG tablet Take 1,000 mcg by mouth daily. Special Instructions: for vit B 12 deficiency (Patient not taking: Reported on 03/03/2023)   losartan (COZAAR) 25 MG tablet Take 12.5 mg by mouth daily. (Patient not taking: Reported on 03/03/2023)   No facility-administered encounter medications on file as of 03/03/2023.     SIGNIFICANT DIAGNOSTIC EXAMS  PREVIOUS   07-27-22: dexa t score: 0.636  NO NEW EXAMS    LABS REVIEWED PREVIOUS;    04-07-22: wbc 10.9; hgb 11.1; hct 35.0; mcv 82.4 plt 233; glucose 147; bun 18; creat 0.88; k+ 3.6; na++ 139; ca 87; gfr >60; protein 6.5; albumin 3.6 chol 134; ldl 49; trig 169; hdl 51; urine micro-albumin 26.2    06-30-22: glucose 150; bun 17; creat 0.81; k+ 3.8; na++ 138; ca 9.1 gfr >60 07-02-22: wbc 10.5; hgb 11.1; hct 35.4; mcv 82.5 plt 230; glucose 138; bun 18; creat 0.98; k+ 4.2; na++ 137; ca 8.9; gfr >60;  07-08-22: hgb a1c 7.8  09-26-22: hgb a1c: 8.0 12-29-22: wbc 10.0; hgb 10.8; hct 35.2 mcv 82.6 plt 212; glucose 131; bun 21; creat 0.95; k+ 3.6; na++ 136; ca 8.6; gfr >60; protein 6.6 albumin 3.6; hgb A1c 7.2; chol 132; ldl 50; trig 134; hdl 55 01-26-23: d-dimer: 0.46   NO NEW LABS.   Review of Systems  Constitutional:  Negative for malaise/fatigue.  Respiratory:  Negative for cough and shortness of breath.   Cardiovascular:  Negative for  chest pain, palpitations and leg swelling.  Gastrointestinal:  Negative for abdominal pain, constipation and heartburn.  Musculoskeletal:  Negative for back pain, joint pain and myalgias.  Skin: Negative.   Neurological:  Negative for dizziness.  Psychiatric/Behavioral:  The patient is not nervous/anxious.     Physical Exam Constitutional:      General: She is not in acute distress.    Appearance: She is not diaphoretic.  Eyes:     Conjunctiva/sclera: Conjunctivae normal.  Neck:     Thyroid: No thyromegaly.     Vascular: No JVD.  Cardiovascular:     Rate and Rhythm: Normal rate and regular rhythm.     Pulses: Normal pulses.  Pulmonary:     Effort: Pulmonary effort is normal. No respiratory distress.     Breath sounds: Normal breath sounds. No wheezing.  Abdominal:     General: Bowel sounds are normal. There is no distension.     Palpations: Abdomen is soft.     Tenderness: There is no abdominal tenderness.  Musculoskeletal:        General: Normal range of motion.     Cervical back: Neck supple.     Right lower leg: No edema.     Left lower leg: No edema.     Comments: Able to move all extremities   Lymphadenopathy:     Cervical: No cervical adenopathy.  Skin:    General: Skin is warm and dry.  Neurological:     Mental Status: She is alert and oriented to person, place, and time.  Psychiatric:        Mood and Affect: Mood normal.       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   Time spent with patient 40 minute: medications; dietary; activities.    Ok Edwards NP [facility]  call [phone]

## 2023-03-21 ENCOUNTER — Encounter: Payer: Self-pay | Admitting: Adult Health

## 2023-03-21 ENCOUNTER — Non-Acute Institutional Stay (SKILLED_NURSING_FACILITY): Payer: Medicare Other | Admitting: Adult Health

## 2023-03-21 DIAGNOSIS — E538 Deficiency of other specified B group vitamins: Secondary | ICD-10-CM

## 2023-03-21 DIAGNOSIS — K5909 Other constipation: Secondary | ICD-10-CM | POA: Diagnosis not present

## 2023-03-21 DIAGNOSIS — J3089 Other allergic rhinitis: Secondary | ICD-10-CM | POA: Diagnosis not present

## 2023-03-21 DIAGNOSIS — B009 Herpesviral infection, unspecified: Secondary | ICD-10-CM | POA: Diagnosis not present

## 2023-03-21 NOTE — Progress Notes (Unsigned)
Location:  [facility] Room Number: North/115/W Place of Service:  SNF (31)   CODE STATUS: Full Code  Allergies  Allergen Reactions   Gabapentin    Lisinopril    Penicillins Nausea Only    Has patient had a PCN reaction causing immediate rash, facial/tongue/throat swelling, SOB or lightheadedness with hypotension: Yes Has patient had a PCN reaction causing severe rash involving mucus membranes or skin necrosis: No Has patient had a PCN reaction that required hospitalization No Has patient had a PCN reaction occurring within the last 10 years: No If all of the above answers are "NO", then may proceed with Cephalosporin use.    Sulfa Antibiotics Nausea And Vomiting    Chief Complaint  Patient presents with   Medical Management of Chronic Issues   Quality Metric Gaps    Needs to discuss Medicare Annual Wellness, Covid vaccine and Diabetic Kidney evaluation    HPI:    Past Medical History:  Diagnosis Date   Anxiety    Bowel obstruction    Burning with urination 01/04/2016   Diabetes mellitus with neuropathy    Genital herpes    GERD (gastroesophageal reflux disease)    Hematuria 01/04/2016   Herpes 12/22/2015   Hyperlipidemia    Hypertension    LLQ abdominal tenderness 01/04/2016   Neuropathy    Pelvic pressure in female 01/04/2016   Recurrent UTI    Sciatic nerve disease    Small bowel obstruction 04/27/2016   Urinary frequency 01/04/2016   Urticaria     Past Surgical History:  Procedure Laterality Date   ABDOMINAL HYSTERECTOMY     APPLICATION OF INTRAOPERATIVE CT SCAN N/A 12/19/2018   Procedure: APPLICATION OF INTRAOPERATIVE CT SCAN;  Surgeon: Maeola Harman, MD;  Location: [facility] OR;  Service: Neurosurgery;  Laterality: N/A;   bowel obstruction     COLON SURGERY     blocked colon   POSTERIOR CERVICAL FUSION/FORAMINOTOMY N/A 12/19/2018   Procedure: Cervical Seven to Thoracic One Posterior cervical fusion;  Surgeon: Maeola Harman, MD;   Location: [facility] OR;  Service: Neurosurgery;  Laterality: N/A;  Cervical Seven to Thoracic One Posterior cervical fusion    Social History   Socioeconomic History   Marital status: Widowed    Spouse name: Not on file   Number of children: Not on file   Years of education: Not on file   Highest education level: Not on file  Occupational History   Occupation: retired   Tobacco Use   Smoking status: Never   Smokeless tobacco: Current    Types: Snuff  Vaping Use   Vaping Use: Never used  Substance and Sexual Activity   Alcohol use: No   Drug use: No   Sexual activity: Not Currently    Birth control/protection: Surgical    Comment: hyst  Other Topics Concern   Not on file  Social History Narrative   Long term resident of [facility]    Social Determinants of Health   Financial Resource Strain: Not on file  Food Insecurity: Not on file  Transportation Needs: Not on file  Physical Activity: Not on file  Stress: Not on file  Social Connections: Not on file  Intimate Partner Violence: Not on file   Family History  Problem Relation Age of Onset   Diabetes Mother    Heart disease Brother    Hypertension Brother    Diabetes Brother    Hypertension Daughter    Diabetes Brother    Diabetes Brother  Alzheimer's disease Brother    Anxiety disorder Daughter       VITAL SIGNS BP (!) 151/76   Pulse 80   Temp 97.9 F (36.6 C)   Resp 18   Ht 5' (1.524 m)   Wt 183 lb 12.8 oz (83.4 kg)   SpO2 96%   BMI 35.90 kg/m   Outpatient Encounter Medications as of 03/21/2023  Medication Sig   acetaminophen (TYLENOL) 650 MG CR tablet Take 650 mg by mouth 3 (three) times daily.   amLODipine (NORVASC) 10 MG tablet Take 1 tablet (10 mg total) by mouth daily.   aspirin 81 MG chewable tablet Chew 81 mg by mouth daily.   busPIRone (BUSPAR) 5 MG tablet Take 5 mg by mouth 3 (three) times daily.   carvedilol (COREG) 3.125 MG tablet Take 3.125 mg by mouth 2 (two) times daily with a meal.    chlorthalidone (HYGROTON) 25 MG tablet Take 25 mg by mouth daily.   Cranberry 500 MG CAPS Take by mouth in the morning and at bedtime. For UTI prevention   Dextromethorphan-guaiFENesin 10-100 MG/5ML liquid Take 10 mLs by mouth at bedtime as needed.   DULoxetine (CYMBALTA) 60 MG capsule Take 60 mg by mouth daily. Pain management   Insulin Glargine (BASAGLAR KWIKPEN) 100 UNIT/ML Inject 10 Units into the skin at bedtime.   lamoTRIgine (LAMICTAL) 25 MG tablet Take 25 mg by mouth daily.   linagliptin (TRADJENTA) 5 MG TABS tablet Take 5 mg by mouth daily.   loratadine (CLARITIN) 10 MG tablet Take 10 mg by mouth daily.   Melatonin 5 MG TABS Take 5 mg by mouth at bedtime.   meloxicam (MOBIC) 15 MG tablet Take 15 mg by mouth daily. Hip Pain   Menthol, Topical Analgesic, (BIOFREEZE) 4 % GEL Apply topically. Every Shift - PRN   metFORMIN (GLUCOPHAGE) 1000 MG tablet Take 1,000 mg by mouth 2 (two) times daily with a meal.    mirabegron ER (MYRBETRIQ) 50 MG TB24 tablet Take 50 mg by mouth daily.   montelukast (SINGULAIR) 10 MG tablet Take 1 tablet (10 mg total) by mouth at bedtime.   NON FORMULARY Diet: Regular diet   omeprazole (PRILOSEC) 20 MG capsule Take 20 mg by mouth daily. Special Instructions:  *TAKE ON AN EMPTY STOMACH* *DO NOT CRUSH*   polyethylene glycol (MIRALAX / GLYCOLAX) packet Take 17 g by mouth daily.   pregabalin (LYRICA) 25 MG capsule Take 1 capsule (25 mg total) by mouth at bedtime.   PRESCRIPTION MEDICATION MICROGUARD POWDER  powder; amt: SPARINGLY; topical Special Instructions: REDNESS UNDER BREAST Once A Day   senna-docusate (SENOKOT-S) 8.6-50 MG tablet Take 1 tablet by mouth at bedtime.    sertraline (ZOLOFT) 50 MG tablet Take 50 mg by mouth daily.   simvastatin (ZOCOR) 10 MG tablet Take 10 mg by mouth at bedtime.    triamcinolone (NASACORT) 55 MCG/ACT AERO nasal inhaler Place 1 spray into the nose daily.   valACYclovir (VALTREX) 500 MG tablet Take 500 mg by mouth daily.     [DISCONTINUED] celecoxib (CELEBREX) 100 MG capsule Take 1 capsule by mouth 2 (two) times daily. For back pain. Give with food. (Patient not taking: Reported on 03/03/2023)   [DISCONTINUED] cyanocobalamin 1000 MCG tablet Take 1,000 mcg by mouth daily. Special Instructions: for vit B 12 deficiency (Patient not taking: Reported on 03/03/2023)   [DISCONTINUED] losartan (COZAAR) 25 MG tablet Take 12.5 mg by mouth daily. (Patient not taking: Reported on 03/03/2023)   No facility-administered encounter medications  on file as of 03/21/2023.     SIGNIFICANT DIAGNOSTIC EXAMS       ASSESSMENT/ PLAN:     Synthia Innocenteborah Emya Picado NP [facility]  Contact [phone] Monday through Friday 8am- 5pm  After hours call [phone]

## 2023-03-23 ENCOUNTER — Other Ambulatory Visit: Payer: Self-pay | Admitting: Adult Health

## 2023-03-23 ENCOUNTER — Other Ambulatory Visit (HOSPITAL_COMMUNITY)
Admission: RE | Admit: 2023-03-23 | Discharge: 2023-03-23 | Disposition: A | Discharge status: Home / Self Care | Payer: Medicare Other | Source: Skilled Nursing Facility | Attending: Adult Health | Admitting: Adult Health

## 2023-03-23 DIAGNOSIS — F0390 Unspecified dementia without behavioral disturbance: Secondary | ICD-10-CM | POA: Insufficient documentation

## 2023-03-23 LAB — VITAMIN B12: Vitamin B-12: 806 pg/mL (ref 180–914)

## 2023-03-23 LAB — TSH: TSH: 5.41 u[IU]/mL — ABNORMAL HIGH (ref 0.350–4.500)

## 2023-03-23 MED ORDER — PREGABALIN 25 MG PO CAPS: 25.0000 mg | ORAL_CAPSULE | Freq: Every day | ORAL | 0 refills | Status: DC

## 2023-03-27 ENCOUNTER — Non-Acute Institutional Stay (SKILLED_NURSING_FACILITY): Payer: Medicare Other | Admitting: Adult Health

## 2023-03-27 ENCOUNTER — Encounter: Payer: Self-pay | Admitting: Adult Health

## 2023-03-27 DIAGNOSIS — J189 Pneumonia, unspecified organism: Secondary | ICD-10-CM

## 2023-03-27 NOTE — Progress Notes (Deleted)
Location:  [facility] Room Number: 115W Place of Service:  SNF (31)   CODE STATUS: Full Code  Allergies  Allergen Reactions   Gabapentin    Lisinopril    Penicillins Nausea Only    Has patient had a PCN reaction causing immediate rash, facial/tongue/throat swelling, SOB or lightheadedness with hypotension: Yes Has patient had a PCN reaction causing severe rash involving mucus membranes or skin necrosis: No Has patient had a PCN reaction that required hospitalization No Has patient had a PCN reaction occurring within the last 10 years: No If all of the above answers are "NO", then may proceed with Cephalosporin use.    Sulfa Antibiotics Nausea And Vomiting    Chief Complaint  Patient presents with   Medicare Wellness    Annual Medicare Wellness   Quality Metric Gaps    Needs to discuss Covid and Diabetic Kidney Evalution    HPI:    Past Medical History:  Diagnosis Date   Anxiety    Bowel obstruction    Burning with urination 01/04/2016   Diabetes mellitus with neuropathy    Genital herpes    GERD (gastroesophageal reflux disease)    Hematuria 01/04/2016   Herpes 12/22/2015   Hyperlipidemia    Hypertension    LLQ abdominal tenderness 01/04/2016   Neuropathy    Pelvic pressure in female 01/04/2016   Recurrent UTI    Sciatic nerve disease    Small bowel obstruction 04/27/2016   Urinary frequency 01/04/2016   Urticaria     Past Surgical History:  Procedure Laterality Date   ABDOMINAL HYSTERECTOMY     APPLICATION OF INTRAOPERATIVE CT SCAN N/A 12/19/2018   Procedure: APPLICATION OF INTRAOPERATIVE CT SCAN;  Surgeon: Maeola Harman, MD;  Location: [facility] OR;  Service: Neurosurgery;  Laterality: N/A;   bowel obstruction     COLON SURGERY     blocked colon   POSTERIOR CERVICAL FUSION/FORAMINOTOMY N/A 12/19/2018   Procedure: Cervical Seven to Thoracic One Posterior cervical fusion;  Surgeon: Maeola Harman, MD;  Location: [facility] OR;  Service:  Neurosurgery;  Laterality: N/A;  Cervical Seven to Thoracic One Posterior cervical fusion    Social History   Socioeconomic History   Marital status: Widowed    Spouse name: Not on file   Number of children: Not on file   Years of education: Not on file   Highest education level: Not on file  Occupational History   Occupation: retired   Tobacco Use   Smoking status: Never   Smokeless tobacco: Current    Types: Snuff  Vaping Use   Vaping Use: Never used  Substance and Sexual Activity   Alcohol use: No   Drug use: No   Sexual activity: Not Currently    Birth control/protection: Surgical    Comment: hyst  Other Topics Concern   Not on file  Social History Narrative   Long term resident of [facility]    Social Determinants of Health   Financial Resource Strain: Not on file  Food Insecurity: Not on file  Transportation Needs: Not on file  Physical Activity: Not on file  Stress: Not on file  Social Connections: Not on file  Intimate Partner Violence: Not on file   Family History  Problem Relation Age of Onset   Diabetes Mother    Heart disease Brother    Hypertension Brother    Diabetes Brother    Hypertension Daughter    Diabetes Brother    Diabetes Brother  Alzheimer's disease Brother    Anxiety disorder Daughter       VITAL SIGNS BP (!) 142/68   Pulse 82   Temp 98.8 F (37.1 C)   Resp (!) 22   Ht 5' (1.524 m)   Wt 183 lb 8 oz (83.2 kg)   SpO2 98%   BMI 35.84 kg/m   Outpatient Encounter Medications as of 03/27/2023  Medication Sig   acetaminophen (TYLENOL) 650 MG CR tablet Take 650 mg by mouth 3 (three) times daily.   amLODipine (NORVASC) 10 MG tablet Take 1 tablet (10 mg total) by mouth daily.   aspirin 81 MG chewable tablet Chew 81 mg by mouth daily.   busPIRone (BUSPAR) 5 MG tablet Take 5 mg by mouth 3 (three) times daily.   carvedilol (COREG) 3.125 MG tablet Take 3.125 mg by mouth 2 (two) times daily with a meal.   chlorthalidone (HYGROTON) 25 MG  tablet Take 25 mg by mouth daily.   Cranberry 500 MG CAPS Take by mouth in the morning and at bedtime. For UTI prevention   Dextromethorphan-guaiFENesin 10-100 MG/5ML liquid Take 10 mLs by mouth at bedtime as needed.   DULoxetine (CYMBALTA) 60 MG capsule Take 60 mg by mouth daily. Pain management   fluticasone-salmeterol (ADVAIR) 100-50 MCG/ACT AEPB Inhale 1 puff into the lungs 2 (two) times daily.   Insulin Glargine (BASAGLAR KWIKPEN) 100 UNIT/ML Inject 10 Units into the skin at bedtime.   lamoTRIgine (LAMICTAL) 25 MG tablet Take 25 mg by mouth daily.   linagliptin (TRADJENTA) 5 MG TABS tablet Take 5 mg by mouth daily.   loratadine (CLARITIN) 10 MG tablet Take 10 mg by mouth daily.   Melatonin 5 MG TABS Take 5 mg by mouth at bedtime.   meloxicam (MOBIC) 15 MG tablet Take 15 mg by mouth daily. Hip Pain   Menthol, Topical Analgesic, (BIOFREEZE) 4 % GEL Apply topically. Every Shift - PRN   metFORMIN (GLUCOPHAGE) 1000 MG tablet Take 1,000 mg by mouth 2 (two) times daily with a meal.    mirabegron ER (MYRBETRIQ) 50 MG TB24 tablet Take 50 mg by mouth daily.   montelukast (SINGULAIR) 10 MG tablet Take 1 tablet (10 mg total) by mouth at bedtime.   NON FORMULARY Diet: Regular diet   omeprazole (PRILOSEC) 20 MG capsule Take 20 mg by mouth daily. Special Instructions:  *TAKE ON AN EMPTY STOMACH* *DO NOT CRUSH*   polyethylene glycol (MIRALAX / GLYCOLAX) packet Take 17 g by mouth daily.   pregabalin (LYRICA) 25 MG capsule Take 1 capsule (25 mg total) by mouth at bedtime.   PRESCRIPTION MEDICATION MICROGUARD POWDER  powder; amt: SPARINGLY; topical Special Instructions: REDNESS UNDER BREAST Once A Day   senna-docusate (SENOKOT-S) 8.6-50 MG tablet Take 1 tablet by mouth at bedtime.    sertraline (ZOLOFT) 50 MG tablet Take 50 mg by mouth daily.   simvastatin (ZOCOR) 10 MG tablet Take 10 mg by mouth at bedtime.    triamcinolone (NASACORT) 55 MCG/ACT AERO nasal inhaler Place 1 spray into the nose daily.    valACYclovir (VALTREX) 500 MG tablet Take 500 mg by mouth daily.    No facility-administered encounter medications on file as of 03/27/2023.     SIGNIFICANT DIAGNOSTIC EXAMS       ASSESSMENT/ PLAN:     Synthia Innocent NP [facility]  Contact [phone] Monday through Friday 8am- 5pm  After hours call [phone]

## 2023-03-27 NOTE — Progress Notes (Unsigned)
Location:  [facility] Room Number: 115W Place of Service:  SNF (31)   CODE STATUS: Full Code  Allergies  Allergen Reactions   Gabapentin    Lisinopril    Penicillins Nausea Only    Has patient had a PCN reaction causing immediate rash, facial/tongue/throat swelling, SOB or lightheadedness with hypotension: Yes Has patient had a PCN reaction causing severe rash involving mucus membranes or skin necrosis: No Has patient had a PCN reaction that required hospitalization No Has patient had a PCN reaction occurring within the last 10 years: No If all of the above answers are "NO", then may proceed with Cephalosporin use.    Sulfa Antibiotics Nausea And Vomiting    Chief Complaint  Patient presents with   Acute Visit    Cough    HPI:    Past Medical History:  Diagnosis Date   Anxiety    Bowel obstruction    Burning with urination 01/04/2016   Diabetes mellitus with neuropathy    Genital herpes    GERD (gastroesophageal reflux disease)    Hematuria 01/04/2016   Herpes 12/22/2015   Hyperlipidemia    Hypertension    LLQ abdominal tenderness 01/04/2016   Neuropathy    Pelvic pressure in female 01/04/2016   Recurrent UTI    Sciatic nerve disease    Small bowel obstruction 04/27/2016   Urinary frequency 01/04/2016   Urticaria     Past Surgical History:  Procedure Laterality Date   ABDOMINAL HYSTERECTOMY     APPLICATION OF INTRAOPERATIVE CT SCAN N/A 12/19/2018   Procedure: APPLICATION OF INTRAOPERATIVE CT SCAN;  Surgeon: Maeola Harman, MD;  Location: [facility] OR;  Service: Neurosurgery;  Laterality: N/A;   bowel obstruction     COLON SURGERY     blocked colon   POSTERIOR CERVICAL FUSION/FORAMINOTOMY N/A 12/19/2018   Procedure: Cervical Seven to Thoracic One Posterior cervical fusion;  Surgeon: Maeola Harman, MD;  Location: [facility] OR;  Service: Neurosurgery;  Laterality: N/A;  Cervical Seven to Thoracic One Posterior cervical fusion    Social History    Socioeconomic History   Marital status: Widowed    Spouse name: Not on file   Number of children: Not on file   Years of education: Not on file   Highest education level: Not on file  Occupational History   Occupation: retired   Tobacco Use   Smoking status: Never   Smokeless tobacco: Current    Types: Snuff  Vaping Use   Vaping Use: Never used  Substance and Sexual Activity   Alcohol use: No   Drug use: No   Sexual activity: Not Currently    Birth control/protection: Surgical    Comment: hyst  Other Topics Concern   Not on file  Social History Narrative   Long term resident of [facility]    Social Determinants of Health   Financial Resource Strain: Not on file  Food Insecurity: Not on file  Transportation Needs: Not on file  Physical Activity: Not on file  Stress: Not on file  Social Connections: Not on file  Intimate Partner Violence: Not on file   Family History  Problem Relation Age of Onset   Diabetes Mother    Heart disease Brother    Hypertension Brother    Diabetes Brother    Hypertension Daughter    Diabetes Brother    Diabetes Brother    Alzheimer's disease Brother    Anxiety disorder Daughter       VITAL SIGNS  BP (!) 142/68   Pulse 82   Temp 98.8 F (37.1 C)   Resp (!) 22   Ht 5' (1.524 m)   Wt 183 lb 8 oz (83.2 kg)   SpO2 98%   BMI 35.84 kg/m   Outpatient Encounter Medications as of 03/27/2023  Medication Sig   acetaminophen (TYLENOL) 650 MG CR tablet Take 650 mg by mouth 3 (three) times daily.   amLODipine (NORVASC) 10 MG tablet Take 1 tablet (10 mg total) by mouth daily.   aspirin 81 MG chewable tablet Chew 81 mg by mouth daily.   busPIRone (BUSPAR) 5 MG tablet Take 5 mg by mouth 3 (three) times daily.   carvedilol (COREG) 3.125 MG tablet Take 3.125 mg by mouth 2 (two) times daily with a meal.   chlorthalidone (HYGROTON) 25 MG tablet Take 25 mg by mouth daily.   Cranberry 500 MG CAPS Take by mouth in the morning and at bedtime. For UTI  prevention   Dextromethorphan-guaiFENesin 10-100 MG/5ML liquid Take 10 mLs by mouth at bedtime as needed.   DULoxetine (CYMBALTA) 60 MG capsule Take 60 mg by mouth daily. Pain management   fluticasone-salmeterol (ADVAIR) 100-50 MCG/ACT AEPB Inhale 1 puff into the lungs 2 (two) times daily.   Insulin Glargine (BASAGLAR KWIKPEN) 100 UNIT/ML Inject 10 Units into the skin at bedtime.   lamoTRIgine (LAMICTAL) 25 MG tablet Take 25 mg by mouth daily.   linagliptin (TRADJENTA) 5 MG TABS tablet Take 5 mg by mouth daily.   loratadine (CLARITIN) 10 MG tablet Take 10 mg by mouth daily.   Melatonin 5 MG TABS Take 5 mg by mouth at bedtime.   meloxicam (MOBIC) 15 MG tablet Take 15 mg by mouth daily. Hip Pain   Menthol, Topical Analgesic, (BIOFREEZE) 4 % GEL Apply topically. Every Shift - PRN   metFORMIN (GLUCOPHAGE) 1000 MG tablet Take 1,000 mg by mouth 2 (two) times daily with a meal.    mirabegron ER (MYRBETRIQ) 50 MG TB24 tablet Take 50 mg by mouth daily.   montelukast (SINGULAIR) 10 MG tablet Take 1 tablet (10 mg total) by mouth at bedtime.   NON FORMULARY Diet: Regular diet   omeprazole (PRILOSEC) 20 MG capsule Take 20 mg by mouth daily. Special Instructions:  *TAKE ON AN EMPTY STOMACH* *DO NOT CRUSH*   polyethylene glycol (MIRALAX / GLYCOLAX) packet Take 17 g by mouth daily.   pregabalin (LYRICA) 25 MG capsule Take 1 capsule (25 mg total) by mouth at bedtime.   PRESCRIPTION MEDICATION MICROGUARD POWDER  powder; amt: SPARINGLY; topical Special Instructions: REDNESS UNDER BREAST Once A Day   senna-docusate (SENOKOT-S) 8.6-50 MG tablet Take 1 tablet by mouth at bedtime.    sertraline (ZOLOFT) 50 MG tablet Take 50 mg by mouth daily.   simvastatin (ZOCOR) 10 MG tablet Take 10 mg by mouth at bedtime.    triamcinolone (NASACORT) 55 MCG/ACT AERO nasal inhaler Place 1 spray into the nose daily.   valACYclovir (VALTREX) 500 MG tablet Take 500 mg by mouth daily.    No facility-administered encounter  medications on file as of 03/27/2023.     SIGNIFICANT DIAGNOSTIC EXAMS       ASSESSMENT/ PLAN:     Synthia Innocent NP [facility]  Contact [phone] Monday through Friday 8am- 5pm  After hours call [phone]

## 2023-03-29 ENCOUNTER — Other Ambulatory Visit (HOSPITAL_COMMUNITY): Payer: Self-pay | Admitting: Adult Health

## 2023-03-29 ENCOUNTER — Ambulatory Visit (HOSPITAL_COMMUNITY)
Admission: RE | Admit: 2023-03-29 | Discharge: 2023-03-29 | Disposition: A | Discharge status: Home / Self Care | Payer: Medicare Other | Source: Ambulatory Visit | Attending: Adult Health | Admitting: Adult Health

## 2023-03-29 DIAGNOSIS — R059 Cough, unspecified: Secondary | ICD-10-CM

## 2023-03-29 DIAGNOSIS — R079 Chest pain, unspecified: Secondary | ICD-10-CM | POA: Diagnosis not present

## 2023-03-29 DIAGNOSIS — R531 Weakness: Secondary | ICD-10-CM | POA: Diagnosis not present

## 2023-04-03 ENCOUNTER — Encounter: Payer: Self-pay | Admitting: Adult Health

## 2023-04-03 ENCOUNTER — Non-Acute Institutional Stay (SKILLED_NURSING_FACILITY): Payer: Medicare Other | Admitting: Adult Health

## 2023-04-03 DIAGNOSIS — J189 Pneumonia, unspecified organism: Secondary | ICD-10-CM

## 2023-04-03 NOTE — Progress Notes (Signed)
Location:  [facility] Room Number: 115 W Place of Service:  SNF (31)   CODE STATUS: Full Code  Allergies  Allergen Reactions  . Gabapentin   . Lisinopril   . Penicillins Nausea Only    Has patient had a PCN reaction causing immediate rash, facial/tongue/throat swelling, SOB or lightheadedness with hypotension: Yes Has patient had a PCN reaction causing severe rash involving mucus membranes or skin necrosis: No Has patient had a PCN reaction that required hospitalization No Has patient had a PCN reaction occurring within the last 10 years: No If all of the above answers are "NO", then may proceed with Cephalosporin use.   . Sulfa Antibiotics Nausea And Vomiting    Chief Complaint  Patient presents with  . Acute Visit    Follow up URI    HPI:  She is being treated for community acquired pneumonia with levaquin. Today the staff is concerned about her respiratory status. She is breathing harder, is more short of breath; is hoarse. There are no reports of fevers present.   Past Medical History:  Diagnosis Date  . Anxiety   . Bowel obstruction   . Burning with urination 01/04/2016  . Diabetes mellitus with neuropathy   . Genital herpes   . GERD (gastroesophageal reflux disease)   . Hematuria 01/04/2016  . Herpes 12/22/2015  . Hyperlipidemia   . Hypertension   . LLQ abdominal tenderness 01/04/2016  . Neuropathy   . Pelvic pressure in female 01/04/2016  . Recurrent UTI   . Sciatic nerve disease   . Small bowel obstruction 04/27/2016  . Urinary frequency 01/04/2016  . Urticaria     Past Surgical History:  Procedure Laterality Date  . ABDOMINAL HYSTERECTOMY    . APPLICATION OF INTRAOPERATIVE CT SCAN N/A 12/19/2018   Procedure: APPLICATION OF INTRAOPERATIVE CT SCAN;  Surgeon: Maeola Harman, MD;  Location: [facility] OR;  Service: Neurosurgery;  Laterality: N/A;  . bowel obstruction    . COLON SURGERY     blocked colon  . POSTERIOR CERVICAL  FUSION/FORAMINOTOMY N/A 12/19/2018   Procedure: Cervical Seven to Thoracic One Posterior cervical fusion;  Surgeon: Maeola Harman, MD;  Location: [facility] OR;  Service: Neurosurgery;  Laterality: N/A;  Cervical Seven to Thoracic One Posterior cervical fusion    Social History   Socioeconomic History  . Marital status: Widowed    Spouse name: Not on file  . Number of children: Not on file  . Years of education: Not on file  . Highest education level: Not on file  Occupational History  . Occupation: retired   Tobacco Use  . Smoking status: Never  . Smokeless tobacco: Current    Types: Snuff  Vaping Use  . Vaping Use: Never used  Substance and Sexual Activity  . Alcohol use: No  . Drug use: No  . Sexual activity: Not Currently    Birth control/protection: Surgical    Comment: hyst  Other Topics Concern  . Not on file  Social History Narrative   Long term resident of [facility]    Social Determinants of Health   Financial Resource Strain: Not on file  Food Insecurity: Not on file  Transportation Needs: Not on file  Physical Activity: Not on file  Stress: Not on file  Social Connections: Not on file  Intimate Partner Violence: Not on file   Family History  Problem Relation Age of Onset  . Diabetes Mother   . Heart disease Brother   . Hypertension Brother   .  Diabetes Brother   . Hypertension Daughter   . Diabetes Brother   . Diabetes Brother   . Alzheimer's disease Brother   . Anxiety disorder Daughter       VITAL SIGNS BP 119/63   Pulse 94   Temp (!) 97.2 F (36.2 C)   Resp 20   Ht 5' (1.524 m)   Wt 183 lb 12.8 oz (83.4 kg)   SpO2 91%   BMI 35.90 kg/m   Outpatient Encounter Medications as of 04/03/2023  Medication Sig  . acetaminophen (TYLENOL) 650 MG CR tablet Take 650 mg by mouth 3 (three) times daily.  Marland Kitchen amLODipine (NORVASC) 10 MG tablet Take 1 tablet (10 mg total) by mouth daily.  Marland Kitchen aspirin 81 MG chewable tablet Chew 81 mg by mouth daily.  . busPIRone (BUSPAR)  5 MG tablet Take 5 mg by mouth 3 (three) times daily.  . carvedilol (COREG) 3.125 MG tablet Take 3.125 mg by mouth 2 (two) times daily with a meal.  . chlorthalidone (HYGROTON) 25 MG tablet Take 25 mg by mouth daily.  . Cranberry 500 MG CAPS Take by mouth in the morning and at bedtime. For UTI prevention  . Dextromethorphan-guaiFENesin 10-100 MG/5ML liquid Take 10 mLs by mouth at bedtime as needed.  . DULoxetine (CYMBALTA) 60 MG capsule Take 60 mg by mouth daily. Pain management  . fluticasone-salmeterol (ADVAIR) 100-50 MCG/ACT AEPB Inhale 1 puff into the lungs 2 (two) times daily.  . Insulin Glargine (BASAGLAR KWIKPEN) 100 UNIT/ML Inject 10 Units into the skin at bedtime.  Marland Kitchen ipratropium-albuterol (DUONEB) 0.5-2.5 (3) MG/3ML SOLN SMARTSIG:3 Milliliter(s) Via Nebulizer Every 6 Hours PRN  . lamoTRIgine (LAMICTAL) 25 MG tablet Take 25 mg by mouth daily.  Marland Kitchen levofloxacin (LEVAQUIN) 500 MG tablet Take 500 mg by mouth daily.  Marland Kitchen linagliptin (TRADJENTA) 5 MG TABS tablet Take 5 mg by mouth daily.  Marland Kitchen loratadine (CLARITIN) 10 MG tablet Take 10 mg by mouth daily.  . Melatonin 5 MG TABS Take 5 mg by mouth at bedtime.  . meloxicam (MOBIC) 15 MG tablet Take 15 mg by mouth daily. Hip Pain  . Menthol, Topical Analgesic, (BIOFREEZE) 4 % GEL Apply topically. Every Shift - PRN  . metFORMIN (GLUCOPHAGE) 1000 MG tablet Take 1,000 mg by mouth 2 (two) times daily with a meal.   . mirabegron ER (MYRBETRIQ) 50 MG TB24 tablet Take 50 mg by mouth daily.  . montelukast (SINGULAIR) 10 MG tablet Take 1 tablet (10 mg total) by mouth at bedtime.  . NON FORMULARY Diet: Regular diet  . omeprazole (PRILOSEC) 20 MG capsule Take 20 mg by mouth daily. Special Instructions:  *TAKE ON AN EMPTY STOMACH* *DO NOT CRUSH*  . polyethylene glycol (MIRALAX / GLYCOLAX) packet Take 17 g by mouth daily.  . predniSONE (DELTASONE) 20 MG tablet Take 20 mg by mouth 2 (two) times daily.  . pregabalin (LYRICA) 25 MG capsule Take 1 capsule (25 mg  total) by mouth at bedtime.  Marland Kitchen PRESCRIPTION MEDICATION MICROGUARD POWDER  powder; amt: SPARINGLY; topical Special Instructions: REDNESS UNDER BREAST Once A Day  . senna-docusate (SENOKOT-S) 8.6-50 MG tablet Take 1 tablet by mouth at bedtime.   . sertraline (ZOLOFT) 50 MG tablet Take 50 mg by mouth daily.  . simvastatin (ZOCOR) 10 MG tablet Take 10 mg by mouth at bedtime.   . triamcinolone (NASACORT) 55 MCG/ACT AERO nasal inhaler Place 1 spray into the nose daily.  . valACYclovir (VALTREX) 500 MG tablet Take 500 mg by mouth daily.  No facility-administered encounter medications on file as of 04/03/2023.     SIGNIFICANT DIAGNOSTIC EXAMS   PREVIOUS   07-27-22: dexa t score: 0.636  NO NEW EXAMS    LABS REVIEWED PREVIOUS;    04-07-22: wbc 10.9; hgb 11.1; hct 35.0; mcv 82.4 plt 233; glucose 147; bun 18; creat 0.88; k+ 3.6; na++ 139; ca 87; gfr >60; protein 6.5; albumin 3.6 chol 134; ldl 49; trig 169; hdl 51; urine micro-albumin 26.2    06-30-22: glucose 150; bun 17; creat 0.81; k+ 3.8; na++ 138; ca 9.1 gfr >60 07-02-22: wbc 10.5; hgb 11.1; hct 35.4; mcv 82.5 plt 230; glucose 138; bun 18; creat 0.98; k+ 4.2; na++ 137; ca 8.9; gfr >60;  07-08-22: hgb a1c 7.8  09-26-22: hgb a1c: 8.0 12-29-22: wbc 10.0; hgb 10.8; hct 35.2 mcv 82.6 plt 212; glucose 131; bun 21; creat 0.95; k+ 3.6; na++ 136; ca 8.6; gfr >60; protein 6.6 albumin 3.6; hgb A1c 7.2; chol 132; ldl 50; trig 134; hdl 55 01-26-23: d-dimer: 0.46  TODAY  03-02-23: glucose 133; bun 23; creat 0.83; k+ 3.5; na++ 134; ca 9.1 gfr >60 03-23-23: tsh 5.410; vitamin B 12: 806    Review of Systems  Constitutional:  Positive for malaise/fatigue.  Respiratory:  Positive for cough, sputum production, shortness of breath and wheezing.   Cardiovascular:  Negative for chest pain, palpitations and leg swelling.  Gastrointestinal:  Negative for abdominal pain, constipation and heartburn.  Musculoskeletal:  Negative for back pain, joint pain and  myalgias.  Skin: Negative.   Neurological:  Negative for dizziness.  Psychiatric/Behavioral:  The patient is not nervous/anxious.    Physical Exam Constitutional:      General: She is not in acute distress.    Appearance: She is well-developed. She is not diaphoretic.  HENT:     Nose: Nose normal.     Mouth/Throat:     Mouth: Mucous membranes are moist.     Pharynx: Oropharynx is clear.  Eyes:     Conjunctiva/sclera: Conjunctivae normal.  Neck:     Thyroid: No thyromegaly.  Cardiovascular:     Rate and Rhythm: Normal rate and regular rhythm.     Heart sounds: Normal heart sounds.  Pulmonary:     Effort: No respiratory distress.     Breath sounds: Wheezing and rhonchi present.     Comments: Increased difficulty with respirations  Abdominal:     General: Bowel sounds are normal. There is no distension.     Palpations: Abdomen is soft.     Tenderness: There is no abdominal tenderness.  Musculoskeletal:        General: Normal range of motion.     Cervical back: Neck supple.     Right lower leg: No edema.     Left lower leg: No edema.  Lymphadenopathy:     Cervical: No cervical adenopathy.  Skin:    General: Skin is warm and dry.  Neurological:     Mental Status: She is alert and oriented to person, place, and time.  Psychiatric:        Mood and Affect: Mood normal.      ASSESSMENT/ PLAN:  TODAY  Community acquired pneumonia: will complete levaquin today will begin prednisone 40 mg daily through 04-07-23 and will continue duoneb through 04-07-23. Will monitor her status.    Synthia Innocent NP [facility]  call [phone]

## 2023-04-04 ENCOUNTER — Non-Acute Institutional Stay (SKILLED_NURSING_FACILITY): Payer: Medicare Other | Admitting: Adult Health

## 2023-04-04 ENCOUNTER — Encounter: Payer: Self-pay | Admitting: Adult Health

## 2023-04-04 DIAGNOSIS — Z0000 Encounter for general adult medical examination without abnormal findings: Secondary | ICD-10-CM | POA: Diagnosis not present

## 2023-04-04 NOTE — Patient Instructions (Signed)
  [name] , Thank you for taking time to come for your Medicare Wellness Visit. I appreciate your ongoing commitment to your health goals. Please review the following plan we discussed and let me know if I can assist you in the future.   These are the goals we discussed:  Goals      Absence of Fall and Fall-Related Injury     Evidence-based guidance:  Assess fall risk using a validated tool when available. Consider balance and gait impairment, muscle weakness, diminished vision or hearing, environmental hazards, presence of urinary or bowel urgency and/or incontinence.  Communicate fall injury risk to interprofessional healthcare team.  Develop a fall prevention plan with the patient and family.  Promote use of personal vision and auditory aids.  Promote reorientation, appropriate sensory stimulation, and routines to decrease risk of fall when changes in mental status are present.  Assess assistance level required for safe and effective self-care; consider referral for home care.  Encourage physical activity, such as performance of self-care at highest level of ability, strength and balance exercise program, and provision of appropriate assistive devices; refer to rehabilitation therapy.  Refer to community-based fall prevention program where available.  If fall occurs, determine the cause and revise fall injury prevention plan.  Regularly review medication contribution to fall risk; consider risk related to polypharmacy and age.  Refer to pharmacist for consultation when concerns about medications are revealed.  Balance adequate pain management with potential for oversedation.  Provide guidance related to environmental modifications.  Consider supplementation with Vitamin D.   Notes:      Follow up with Primary Care Provider     General - Client will not be readmitted within 30 days (C-SNP)        This is a list of the screening recommended for you and due dates:  Health Maintenance   Topic Date Due   COVID-19 Vaccine (7 - 2023-24 season) 11/30/2022   Yearly kidney health urinalysis for diabetes  04/08/2023   Eye exam for diabetics  05/15/2023*   Complete foot exam   06/21/2023   Hemoglobin A1C  06/29/2023   Flu Shot  07/13/2023   Yearly kidney function blood test for diabetes  03/01/2024   Medicare Annual Wellness Visit  04/03/2024   DTaP/Tdap/Td vaccine (2 - Td or Tdap) 10/14/2031   Pneumonia Vaccine  Completed   DEXA scan (bone density measurement)  Completed   Zoster (Shingles) Vaccine  Completed   HPV Vaccine  Aged Out  *Topic was postponed. The date shown is not the original due date.

## 2023-04-04 NOTE — Progress Notes (Signed)
Subjective:   [name] [name] is a 82 y.o. female who presents for Medicare Annual (Subsequent) preventive examination.  Review of Systems    Review of Systems  Constitutional:  Negative for malaise/fatigue.  Respiratory:  Positive for cough, sputum production, shortness of breath and wheezing.   Cardiovascular:  Negative for chest pain, palpitations and leg swelling.  Gastrointestinal:  Negative for abdominal pain, constipation and heartburn.  Musculoskeletal:  Negative for back pain, joint pain and myalgias.  Skin: Negative.   Neurological:  Negative for dizziness.  Psychiatric/Behavioral:  The patient is not nervous/anxious.     Cardiac Risk Factors include: advanced age (>20men, >43 women);diabetes mellitus;dyslipidemia;hypertension;obesity (BMI >30kg/m2);sedentary lifestyle     Objective:    Today's Vitals   04/04/23 0905  BP: 119/63  Pulse: 94  Resp: 20  Temp: (!) 97.2 F (36.2 C)  SpO2: 91%  Weight: 183 lb 12.8 oz (83.4 kg)  Height: 5' (1.524 m)   Body mass index is 35.9 kg/m.     04/04/2023    9:16 AM 04/03/2023    2:34 PM 03/27/2023    9:33 AM 03/21/2023   11:22 AM 03/03/2023   11:47 AM 02/22/2023    8:40 AM 12/21/2022   11:36 AM  Advanced Directives  Does Patient Have a Medical Advance Directive? No No No No No No No  Does patient want to make changes to medical advance directive?       No - Patient declined  Would patient like information on creating a medical advance directive? No - Patient declined No - Patient declined No - Patient declined No - Patient declined No - Patient declined No - Patient declined     Current Medications (verified) Outpatient Encounter Medications as of 04/04/2023  Medication Sig   acetaminophen (TYLENOL) 650 MG CR tablet Take 650 mg by mouth 3 (three) times daily.   amLODipine (NORVASC) 10 MG tablet Take 1 tablet (10 mg total) by mouth daily.   aspirin 81 MG chewable tablet Chew 81 mg by mouth daily.   busPIRone (BUSPAR) 5 MG  tablet Take 5 mg by mouth 3 (three) times daily.   carvedilol (COREG) 3.125 MG tablet Take 3.125 mg by mouth 2 (two) times daily with a meal.   chlorthalidone (HYGROTON) 25 MG tablet Take 25 mg by mouth daily.   Cranberry 500 MG CAPS Take by mouth in the morning and at bedtime. For UTI prevention   Dextromethorphan-guaiFENesin 10-100 MG/5ML liquid Take 10 mLs by mouth at bedtime as needed.   DULoxetine (CYMBALTA) 60 MG capsule Take 60 mg by mouth daily. Pain management   Dyclonine HCl 2 MG LOZG Use as directed 1 lozenge in the mouth or throat every 4 (four) hours as needed.   Insulin Glargine (BASAGLAR KWIKPEN) 100 UNIT/ML Inject 10 Units into the skin at bedtime.   ipratropium-albuterol (DUONEB) 0.5-2.5 (3) MG/3ML SOLN SMARTSIG:3 Milliliter(s) Via Nebulizer Every 6 Hours PRN   lamoTRIgine (LAMICTAL) 25 MG tablet Take 25 mg by mouth daily.   linagliptin (TRADJENTA) 5 MG TABS tablet Take 5 mg by mouth daily.   loratadine (CLARITIN) 10 MG tablet Take 10 mg by mouth daily.   Melatonin 5 MG TABS Take 5 mg by mouth at bedtime.   meloxicam (MOBIC) 15 MG tablet Take 15 mg by mouth daily. Hip Pain   Menthol, Topical Analgesic, (BIOFREEZE) 4 % GEL Apply topically. Every Shift - PRN   metFORMIN (GLUCOPHAGE) 1000 MG tablet Take 1,000 mg by mouth 2 (two) times  daily with a meal.    mirabegron ER (MYRBETRIQ) 50 MG TB24 tablet Take 50 mg by mouth daily.   montelukast (SINGULAIR) 10 MG tablet Take 1 tablet (10 mg total) by mouth at bedtime.   NON FORMULARY Diet: Regular diet   omeprazole (PRILOSEC) 20 MG capsule Take 20 mg by mouth daily. Special Instructions:  *TAKE ON AN EMPTY STOMACH* *DO NOT CRUSH*   ondansetron (ZOFRAN) 4 MG tablet Take 4 mg by mouth every 6 (six) hours as needed for nausea or vomiting.   polyethylene glycol (MIRALAX / GLYCOLAX) packet Take 17 g by mouth daily.   predniSONE (DELTASONE) 20 MG tablet Take 20 mg by mouth 2 (two) times daily.   pregabalin (LYRICA) 25 MG capsule Take 1  capsule (25 mg total) by mouth at bedtime.   PRESCRIPTION MEDICATION MICROGUARD POWDER  powder; amt: SPARINGLY; topical Special Instructions: REDNESS UNDER BREAST Once A Day   senna-docusate (SENOKOT-S) 8.6-50 MG tablet Take 1 tablet by mouth at bedtime.    sertraline (ZOLOFT) 25 MG tablet Take 25 mg by mouth daily.   simvastatin (ZOCOR) 10 MG tablet Take 10 mg by mouth at bedtime.    triamcinolone (NASACORT) 55 MCG/ACT AERO nasal inhaler Place 1 spray into the nose daily.   valACYclovir (VALTREX) 500 MG tablet Take 500 mg by mouth daily.    [DISCONTINUED] fluticasone-salmeterol (ADVAIR) 100-50 MCG/ACT AEPB Inhale 1 puff into the lungs 2 (two) times daily.   [DISCONTINUED] levofloxacin (LEVAQUIN) 500 MG tablet Take 500 mg by mouth daily.   [DISCONTINUED] sertraline (ZOLOFT) 50 MG tablet Take 50 mg by mouth daily.   No facility-administered encounter medications on file as of 04/04/2023.    Allergies (verified) Gabapentin, Lisinopril, Penicillins, and Sulfa antibiotics   History: Past Medical History:  Diagnosis Date   Anxiety    Bowel obstruction    Burning with urination 01/04/2016   Diabetes mellitus with neuropathy    Genital herpes    GERD (gastroesophageal reflux disease)    Hematuria 01/04/2016   Herpes 12/22/2015   Hyperlipidemia    Hypertension    LLQ abdominal tenderness 01/04/2016   Neuropathy    Pelvic pressure in female 01/04/2016   Recurrent UTI    Sciatic nerve disease    Small bowel obstruction 04/27/2016   Urinary frequency 01/04/2016   Urticaria    Past Surgical History:  Procedure Laterality Date   ABDOMINAL HYSTERECTOMY     APPLICATION OF INTRAOPERATIVE CT SCAN N/A 12/19/2018   Procedure: APPLICATION OF INTRAOPERATIVE CT SCAN;  Surgeon: Maeola Harman, MD;  Location: [facility];  Service: Neurosurgery;  Laterality: N/A;   bowel obstruction     COLON SURGERY     blocked colon   POSTERIOR CERVICAL FUSION/FORAMINOTOMY N/A 12/19/2018   Procedure: Cervical Seven  to Thoracic One Posterior cervical fusion;  Surgeon: Maeola Harman, MD;  Location: [facility] OR;  Service: Neurosurgery;  Laterality: N/A;  Cervical Seven to Thoracic One Posterior cervical fusion   Family History  Problem Relation Age of Onset   Diabetes Mother    Heart disease Brother    Hypertension Brother    Diabetes Brother    Hypertension Daughter    Diabetes Brother    Diabetes Brother    Alzheimer's disease Brother    Anxiety disorder Daughter    Social History   Socioeconomic History   Marital status: Widowed    Spouse name: Not on file   Number of children: Not on file   Years of education: Not on  file   Highest education level: Not on file  Occupational History   Occupation: retired   Tobacco Use   Smoking status: Never   Smokeless tobacco: Current    Types: Snuff  Vaping Use   Vaping Use: Never used  Substance and Sexual Activity   Alcohol use: No   Drug use: No   Sexual activity: Not Currently    Birth control/protection: Surgical    Comment: hyst  Other Topics Concern   Not on file  Social History Narrative   Long term resident of [facility]    Social Determinants of Health   Financial Resource Strain: Not on file  Food Insecurity: Not on file  Transportation Needs: Not on file  Physical Activity: Not on file  Stress: Not on file  Social Connections: Not on file    Tobacco Counseling Ready to quit: Not Answered Counseling given: Not Answered   Clinical Intake:  Pre-visit preparation completed: Yes  Pain : No/denies pain     BMI - recorded: 35.9 Nutritional Status: BMI > 30  Obese Nutritional Risks: Unintentional weight gain Diabetes: Yes CBG done?: Yes CBG resulted in Enter/ Edit results?: No Did pt. bring in CBG monitor from home?: No  How often do you need to have someone help you when you read instructions, pamphlets, or other written materials from your doctor or pharmacy?: 5 - Always  Diabetic?yes  Interpreter Needed?: No  Comments:  long term resident of [facility]   Activities of Daily Living    04/04/2023   10:33 AM  In your present state of health, do you have any difficulty performing the following activities:  Hearing? 0  Vision? 0  Difficulty concentrating or making decisions? 0  Walking or climbing stairs? 1  Dressing or bathing? 1  Doing errands, shopping? 1  Preparing Food and eating ? Y  Using the Toilet? Y  In the past six months, have you accidently leaked urine? Y  Do you have problems with loss of bowel control? N  Managing your Medications? Y  Managing your Finances? Y  Housekeeping or managing your Housekeeping? Y    Patient Care Team: Sharee Holster, NP as PCP - [facility] (Geriatric [facility]  Indicate any recent Medical Services you may have received from other than [facility] providers in the past year (date may be approximate).     Assessment:   This is a routine wellness examination for [place].  Hearing/Vision screen No results found.  Dietary issues and exercise activities discussed: Current Exercise Habits: The patient does not participate in regular exercise at present, Exercise limited by: None identified   Goals Addressed             This Visit's Progress    Absence of Fall and Fall-Related Injury   On track    Evidence-based guidance:  Assess fall risk using a validated tool when available. Consider balance and gait impairment, muscle weakness, diminished vision or hearing, environmental hazards, presence of urinary or bowel urgency and/or incontinence.  Communicate fall injury risk to interprofessional healthcare team.  Develop a fall prevention plan with the patient and family.  Promote use of personal vision and auditory aids.  Promote reorientation, appropriate sensory stimulation, and routines to decrease risk of fall when changes in mental status are present.  Assess assistance level required for safe and effective  self-care; consider referral for home care.  Encourage physical activity, such as performance of self-care at highest level of ability,  strength and balance exercise program, and provision of appropriate assistive devices; refer to rehabilitation therapy.  Refer to community-based fall prevention program where available.  If fall occurs, determine the cause and revise fall injury prevention plan.  Regularly review medication contribution to fall risk; consider risk related to polypharmacy and age.  Refer to pharmacist for consultation when concerns about medications are revealed.  Balance adequate pain management with potential for oversedation.  Provide guidance related to environmental modifications.  Consider supplementation with Vitamin D.   Notes:      Follow up with Primary Care Provider   On track    General - Client will not be readmitted within 30 days ([facility])   On track      Depression Screen    04/04/2023   10:33 AM 03/21/2023   11:23 AM 03/16/2022    1:51 PM 12/22/2021    1:33 PM 03/08/2021   12:07 PM 03/06/2020   10:20 AM  PHQ 2/9 Scores  PHQ - 2 Score 0 0 0 0 0 0    Fall Risk    04/04/2023   10:33 AM 03/21/2023   11:22 AM 03/16/2022    1:51 PM 12/22/2021    1:32 PM 03/08/2021   12:06 PM  Fall Risk   Falls in the past year? 0 0 0 0 0  Number falls in past yr: 0 0 0 0 0  Injury with Fall? 0 0 0 0 0  Risk for fall due to : Impaired balance/gait No Fall Risks Impaired balance/gait;Impaired mobility  Impaired balance/gait;Impaired mobility  Follow up  Falls evaluation completed   Falls evaluation completed    FALL RISK PREVENTION PERTAINING TO THE HOME:  Any stairs in or around the home? Yes  If so, are there any without handrails? No  Home free of loose throw rugs in walkways, pet beds, electrical cords, etc? Yes  Adequate lighting in your home to reduce risk of falls? Yes   ASSISTIVE DEVICES UTILIZED TO PREVENT FALLS:  Life alert? No  Use of a cane, walker or w/c?  Yes  Grab bars in the bathroom? Yes  Shower chair or bench in shower? Yes  Elevated toilet seat or a handicapped toilet? Yes   TIMED UP AND GO:  Was the test performed? Yes .  Length of time to ambulate 10 feet: 20 sec.   Gait steady and fast with assistive device  Cognitive Function:    04/04/2023   10:34 AM 03/16/2022    1:52 PM 03/08/2021   12:09 PM  MMSE - Mini Mental State Exam  Not completed:   Refused  Orientation to time 4 4   Orientation to Place 5 5   Registration 3 3   Attention/ Calculation 4 4   Recall 2 2   Language- name 2 objects 2 2   Language- repeat 1 1   Language- follow 3 step command 3 2   Language- read & follow direction 1 1   Write a sentence 0 0   Copy design 0 0   Total score 25 24         04/04/2023   10:35 AM 03/16/2022    1:53 PM 03/06/2020   10:23 AM  6CIT Screen  What Year? 0 points 0 points 0 points  What month? 0 points 0 points 0 points  What time? 3 points 3 points 3 points  Count back from 20 2 points 2 points 2 points  Months in reverse 2 points 2  points 2 points  Repeat phrase 2 points 2 points 2 points  Total Score 9 points 9 points 9 points    Immunizations Immunization History  Administered Date(s) Administered   Fluad Quad(high Dose 65+) 09/13/2022   Influenza,inj,Quad PF,6+ Mos 09/15/2021   Influenza-Unspecified 09/16/2019, 09/18/2020   Moderna Covid-19 Vaccine Bivalent Booster 30yrs & up 10/05/2021, 10/05/2022   Moderna SARS-COV2 Booster Vaccination 01/15/2021, 07/28/2021   Moderna Sars-Covid-2 Vaccination 05/08/2020, 06/04/2020   PNEUMOCOCCAL CONJUGATE-20 07/06/2021   Rsv, Bivalent, Protein Subunit Rsvpref,pf Verdis Frederickson) 11/14/2022   Tdap 10/13/2021   Zoster Recombinat (Shingrix) 08/10/2021, 11/12/2021    TDAP status: Up to date  Flu Vaccine status: Up to date  Pneumococcal vaccine status: Up to date  Covid-19 vaccine status: Completed vaccines  Qualifies for Shingles Vaccine? Yes   Zostavax completed Yes    Shingrix Completed?: Yes  Screening Tests Health Maintenance  Topic Date Due   COVID-19 Vaccine (7 - 2023-24 season) 11/30/2022   Medicare Annual Wellness (AWV)  03/12/2023   Diabetic kidney evaluation - Urine ACR  04/08/2023   OPHTHALMOLOGY EXAM  05/15/2023 (Originally 11/17/2022)   FOOT EXAM  06/21/2023   HEMOGLOBIN A1C  06/29/2023   INFLUENZA VACCINE  07/13/2023   Diabetic kidney evaluation - eGFR measurement  03/01/2024   DTaP/Tdap/Td (2 - Td or Tdap) 10/14/2031   Pneumonia Vaccine 57+ Years old  Completed   DEXA SCAN  Completed   Zoster Vaccines- Shingrix  Completed   HPV VACCINES  Aged Out    Health Maintenance  Health Maintenance Due  Topic Date Due   COVID-19 Vaccine (7 - 2023-24 season) 11/30/2022   Medicare Annual Wellness (AWV)  03/12/2023   Diabetic kidney evaluation - Urine ACR  04/08/2023    Colorectal cancer screening: No longer required.   Mammogram status: No longer required due to age.  Bone Density status: Completed [id number]. Results reflect: Bone density results: NORMAL. Repeat every 2 years.  Lung Cancer Screening: (Low Dose CT Chest recommended if Age 71-80 years, 30 pack-year currently smoking OR have quit w/in 15years.) does not qualify.   Lung Cancer Screening Referral: n/a  Additional Screening:  Hepatitis C Screening: does not qualify; Completed 08/2020  Vision Screening: Recommended annual ophthalmology exams for early detection of glaucoma and other disorders of the eye. Is the patient up to date with their annual eye exam?  No  Who is the provider or what is the name of the office in which the patient attends annual eye exams?  If pt is not established with a provider, would they like to be referred to a provider to establish care? No .   Dental Screening: Recommended annual dental exams for proper oral hygiene  Community Resource Referral / Chronic Care Management: CRR required this visit?  No   CCM required this visit?  No       Plan:     I have personally reviewed and noted the following in the patient's chart:   Medical and social history Use of alcohol, tobacco or illicit drugs  Current medications and supplements including opioid prescriptions. Patient is currently taking opioid prescriptions. Information provided to patient regarding non-opioid alternatives. Patient advised to discuss non-opioid treatment plan with their provider. Functional ability and status Nutritional status Physical activity Advanced directives List of other physicians Hospitalizations, surgeries, and ER visits in previous 12 months Vitals Screenings to include cognitive, depression, and falls Referrals and appointments  In addition, I have reviewed and discussed with patient certain preventive protocols, quality metrics,  and best practice recommendations. A written personalized care plan for preventive services as well as general preventive health recommendations were provided to patient.     Sharee Holster, NP   04/04/2023   Nurse Notes: this exam was performed by myself at this facility

## 2023-04-04 NOTE — Progress Notes (Signed)
Location:  [facility] Room Number: 115-W Place of Service:  SNF (31) Provider: Synthia Innocent, NP  CODE STATUS: FULL CODE  Allergies  Allergen Reactions   Gabapentin    Lisinopril    Penicillins Nausea Only    Has patient had a PCN reaction causing immediate rash, facial/tongue/throat swelling, SOB or lightheadedness with hypotension: Yes Has patient had a PCN reaction causing severe rash involving mucus membranes or skin necrosis: No Has patient had a PCN reaction that required hospitalization No Has patient had a PCN reaction occurring within the last 10 years: No If all of the above answers are "NO", then may proceed with Cephalosporin use.    Sulfa Antibiotics Nausea And Vomiting    Chief Complaint  Patient presents with   Medicare Wellness    Annual wellness visit.    Health Maintenance    Discuss the need for Diabetic kidney evaluation.    Immunizations    Discuss the need for [employer].    HPI:    Past Medical History:  Diagnosis Date   Anxiety    Bowel obstruction    Burning with urination 01/04/2016   Diabetes mellitus with neuropathy    Genital herpes    GERD (gastroesophageal reflux disease)    Hematuria 01/04/2016   Herpes 12/22/2015   Hyperlipidemia    Hypertension    LLQ abdominal tenderness 01/04/2016   Neuropathy    Pelvic pressure in female 01/04/2016   Recurrent UTI    Sciatic nerve disease    Small bowel obstruction 04/27/2016   Urinary frequency 01/04/2016   Urticaria     Past Surgical History:  Procedure Laterality Date   ABDOMINAL HYSTERECTOMY     APPLICATION OF INTRAOPERATIVE CT SCAN N/A 12/19/2018   Procedure: APPLICATION OF INTRAOPERATIVE CT SCAN;  Surgeon: Maeola Harman, MD;  Location: [facility] OR;  Service: Neurosurgery;  Laterality: N/A;   bowel obstruction     COLON SURGERY     blocked colon   POSTERIOR CERVICAL FUSION/FORAMINOTOMY N/A 12/19/2018   Procedure: Cervical Seven to Thoracic One Posterior  cervical fusion;  Surgeon: Maeola Harman, MD;  Location: [facility] OR;  Service: Neurosurgery;  Laterality: N/A;  Cervical Seven to Thoracic One Posterior cervical fusion    Social History   Socioeconomic History   Marital status: Widowed    Spouse name: Not on file   Number of children: Not on file   Years of education: Not on file   Highest education level: Not on file  Occupational History   Occupation: retired   Tobacco Use   Smoking status: Never   Smokeless tobacco: Current    Types: Snuff  Vaping Use   Vaping Use: Never used  Substance and Sexual Activity   Alcohol use: No   Drug use: No   Sexual activity: Not Currently    Birth control/protection: Surgical    Comment: hyst  Other Topics Concern   Not on file  Social History Narrative   [facility] term resident [facility]    Social Determinants of Health   Financial Resource Strain: Not on file  Food Insecurity: Not on file  Transportation Needs: Not on file  Physical Activity: Not on file  Stress: Not on file  Social Connections: Not on file  Intimate Partner Violence: Not on file   Family History  Problem Relation Age of Onset   Diabetes Mother    Heart disease Brother    Hypertension Brother    Diabetes Brother  Hypertension Daughter    Diabetes Brother    Diabetes Brother    Alzheimer's disease Brother    Anxiety disorder Daughter       VITAL SIGNS BP 119/63   Pulse 94   Temp (!) 97.2 F (36.2 C)   Resp 20   Ht 5' (1.524 m)   Wt 183 lb 12.8 oz (83.4 kg)   SpO2 91%   BMI 35.90 kg/m   Outpatient Encounter Medications as of 04/04/2023  Medication Sig   acetaminophen (TYLENOL) 650 MG CR tablet Take 650 mg by mouth 3 (three) times daily.   amLODipine (NORVASC) 10 MG tablet Take 1 tablet (10 mg total) by mouth daily.   aspirin 81 MG chewable tablet Chew 81 mg by mouth daily.   busPIRone (BUSPAR) 5 MG tablet Take 5 mg by mouth 3 (three) times daily.   carvedilol (COREG) 3.125 MG tablet Take 3.125 mg by  mouth 2 (two) times daily with a meal.   chlorthalidone (HYGROTON) 25 MG tablet Take 25 mg by mouth daily.   Cranberry 500 MG CAPS Take by mouth in the morning and at bedtime. For UTI prevention   Dextromethorphan-guaiFENesin 10-100 MG/5ML liquid Take 10 mLs by mouth at bedtime as needed.   DULoxetine (CYMBALTA) 60 MG capsule Take 60 mg by mouth daily. Pain management   Dyclonine HCl 2 MG LOZG Use as directed 1 lozenge in the mouth or throat every 4 (four) hours as needed.   Insulin Glargine (BASAGLAR KWIKPEN) 100 UNIT/ML Inject 10 Units into the skin at bedtime.   ipratropium-albuterol (DUONEB) 0.5-2.5 (3) MG/3ML SOLN SMARTSIG:3 Milliliter(s) Via Nebulizer Every 6 Hours PRN   lamoTRIgine (LAMICTAL) 25 MG tablet Take 25 mg by mouth daily.   linagliptin (TRADJENTA) 5 MG TABS tablet Take 5 mg by mouth daily.   loratadine (CLARITIN) 10 MG tablet Take 10 mg by mouth daily.   Melatonin 5 MG TABS Take 5 mg by mouth at bedtime.   meloxicam (MOBIC) 15 MG tablet Take 15 mg by mouth daily. Hip Pain   Menthol, Topical Analgesic, (BIOFREEZE) 4 % GEL Apply topically. Every Shift - PRN   metFORMIN (GLUCOPHAGE) 1000 MG tablet Take 1,000 mg by mouth 2 (two) times daily with a meal.    mirabegron ER (MYRBETRIQ) 50 MG TB24 tablet Take 50 mg by mouth daily.   montelukast (SINGULAIR) 10 MG tablet Take 1 tablet (10 mg total) by mouth at bedtime.   NON FORMULARY Diet: Regular diet   omeprazole (PRILOSEC) 20 MG capsule Take 20 mg by mouth daily. Special Instructions:  *TAKE ON AN EMPTY STOMACH* *DO NOT CRUSH*   ondansetron (ZOFRAN) 4 MG tablet Take 4 mg by mouth every 6 (six) hours as needed for nausea or vomiting.   polyethylene glycol (MIRALAX / GLYCOLAX) packet Take 17 g by mouth daily.   predniSONE (DELTASONE) 20 MG tablet Take 20 mg by mouth 2 (two) times daily.   pregabalin (LYRICA) 25 MG capsule Take 1 capsule (25 mg total) by mouth at bedtime.   PRESCRIPTION MEDICATION MICROGUARD POWDER  powder; amt:  SPARINGLY; topical Special Instructions: REDNESS UNDER BREAST Once A Day   senna-docusate (SENOKOT-S) 8.6-50 MG tablet Take 1 tablet by mouth at bedtime.    sertraline (ZOLOFT) 25 MG tablet Take 25 mg by mouth daily.   simvastatin (ZOCOR) 10 MG tablet Take 10 mg by mouth at bedtime.    triamcinolone (NASACORT) 55 MCG/ACT AERO nasal inhaler Place 1 spray into the nose daily.   valACYclovir (  VALTREX) 500 MG tablet Take 500 mg by mouth daily.    [DISCONTINUED] fluticasone-salmeterol (ADVAIR) 100-50 MCG/ACT AEPB Inhale 1 puff into the lungs 2 (two) times daily.   [DISCONTINUED] levofloxacin (LEVAQUIN) 500 MG tablet Take 500 mg by mouth daily.   [DISCONTINUED] sertraline (ZOLOFT) 50 MG tablet Take 50 mg by mouth daily.   No facility-administered encounter medications on file as of 04/04/2023.     SIGNIFICANT DIAGNOSTIC EXAMS       ASSESSMENT/ PLAN:     Synthia Innocent NP [facility]  Contact [phone] Monday through Friday 8am- 5pm  After hours call [phone]

## 2023-04-05 ENCOUNTER — Encounter: Payer: Self-pay | Admitting: Adult Health

## 2023-04-05 ENCOUNTER — Other Ambulatory Visit (HOSPITAL_COMMUNITY)
Admission: RE | Admit: 2023-04-05 | Discharge: 2023-04-05 | Disposition: A | Discharge status: Home / Self Care | Payer: Medicare Other | Source: Skilled Nursing Facility | Attending: Adult Health | Admitting: Adult Health

## 2023-04-05 ENCOUNTER — Non-Acute Institutional Stay (SKILLED_NURSING_FACILITY): Payer: Medicare Other | Admitting: Adult Health

## 2023-04-05 DIAGNOSIS — R051 Acute cough: Secondary | ICD-10-CM | POA: Insufficient documentation

## 2023-04-05 DIAGNOSIS — J189 Pneumonia, unspecified organism: Secondary | ICD-10-CM

## 2023-04-05 DIAGNOSIS — J9811 Atelectasis: Secondary | ICD-10-CM | POA: Diagnosis not present

## 2023-04-05 DIAGNOSIS — R531 Weakness: Secondary | ICD-10-CM | POA: Insufficient documentation

## 2023-04-05 DIAGNOSIS — I517 Cardiomegaly: Secondary | ICD-10-CM | POA: Diagnosis not present

## 2023-04-05 DIAGNOSIS — Z1152 Encounter for screening for COVID-19: Secondary | ICD-10-CM | POA: Diagnosis not present

## 2023-04-05 LAB — RESP PANEL BY RT-PCR (RSV, FLU A&B, COVID)  RVPGX2
Influenza A by PCR: NEGATIVE
Influenza B by PCR: NEGATIVE
Resp Syncytial Virus by PCR: NEGATIVE
SARS Coronavirus 2 by RT PCR: NEGATIVE

## 2023-04-05 LAB — BASIC METABOLIC PANEL
Anion gap: 14 (ref 5–15)
BUN: 48 mg/dL — ABNORMAL HIGH (ref 8–23)
CO2: 30 mmol/L (ref 22–32)
Calcium: 9 mg/dL (ref 8.9–10.3)
Chloride: 89 mmol/L — ABNORMAL LOW (ref 98–111)
Creatinine, Ser: 1.11 mg/dL — ABNORMAL HIGH (ref 0.44–1.00)
GFR, Estimated: 50 mL/min — ABNORMAL LOW (ref >60)
Glucose, Bld: 137 mg/dL — ABNORMAL HIGH (ref 70–99)
Potassium: 3 mmol/L — ABNORMAL LOW (ref 3.5–5.1)
Sodium: 133 mmol/L — ABNORMAL LOW (ref 135–145)

## 2023-04-05 LAB — CBC WITH DIFFERENTIAL/PLATELET
Abs Immature Granulocytes: 0.22 10*3/uL — ABNORMAL HIGH (ref 0.00–0.07)
Basophils Absolute: 0.1 10*3/uL (ref 0.0–0.1)
Basophils Relative: 0 %
Eosinophils Absolute: 0.2 10*3/uL (ref 0.0–0.5)
Eosinophils Relative: 1 %
HCT: 34.6 % — ABNORMAL LOW (ref 36.0–46.0)
Hemoglobin: 11.1 g/dL — ABNORMAL LOW (ref 12.0–15.0)
Immature Granulocytes: 1 %
Lymphocytes Relative: 4 %
Lymphs Abs: 0.8 10*3/uL (ref 0.7–4.0)
MCH: 25.3 pg — ABNORMAL LOW (ref 26.0–34.0)
MCHC: 32.1 g/dL (ref 30.0–36.0)
MCV: 78.8 fL — ABNORMAL LOW (ref 80.0–100.0)
Monocytes Absolute: 2.1 10*3/uL — ABNORMAL HIGH (ref 0.1–1.0)
Monocytes Relative: 10 %
Neutro Abs: 16.5 10*3/uL — ABNORMAL HIGH (ref 1.7–7.7)
Neutrophils Relative %: 84 %
Platelets: 306 10*3/uL (ref 150–400)
RBC: 4.39 MIL/uL (ref 3.87–5.11)
RDW: 16.9 % — ABNORMAL HIGH (ref 11.5–15.5)
WBC: 19.9 10*3/uL — ABNORMAL HIGH (ref 4.0–10.5)
nRBC: 0 % (ref 0.0–0.2)

## 2023-04-05 NOTE — Progress Notes (Signed)
Location:  [facility] Room Number: 115 Place of Service:  SNF (31)   CODE STATUS: full   Allergies  Allergen Reactions   Gabapentin    Lisinopril    Penicillins Nausea Only    Has patient had a PCN reaction causing immediate rash, facial/tongue/throat swelling, SOB or lightheadedness with hypotension: Yes Has patient had a PCN reaction causing severe rash involving mucus membranes or skin necrosis: No Has patient had a PCN reaction that required hospitalization No Has patient had a PCN reaction occurring within the last 10 years: No If all of the above answers are "NO", then may proceed with Cephalosporin use.    Sulfa Antibiotics Nausea And Vomiting    Chief Complaint  Patient presents with   Acute Visit    Pneumonia     HPI:  The staff is concerned that she is not getting any better. She continues to be short of breath. She is complaining of being hoarse. She continues to cough. She is on 02 at this time.  There are no reports of fevers recently. Her uvula is red; swollen and deviated.      Past Medical History:  Diagnosis Date   Anxiety    Bowel obstruction    Burning with urination 01/04/2016   Diabetes mellitus with neuropathy    Genital herpes    GERD (gastroesophageal reflux disease)    Hematuria 01/04/2016   Herpes 12/22/2015   Hyperlipidemia    Hypertension    LLQ abdominal tenderness 01/04/2016   Neuropathy    Pelvic pressure in female 01/04/2016   Recurrent UTI    Sciatic nerve disease    Small bowel obstruction 04/27/2016   Urinary frequency 01/04/2016   Urticaria     Past Surgical History:  Procedure Laterality Date   ABDOMINAL HYSTERECTOMY     APPLICATION OF INTRAOPERATIVE CT SCAN N/A 12/19/2018   Procedure: APPLICATION OF INTRAOPERATIVE CT SCAN;  Surgeon: Maeola Harman, MD;  Location: [facility] OR;  Service: Neurosurgery;  Laterality: N/A;   bowel obstruction     COLON SURGERY     blocked colon   POSTERIOR CERVICAL  FUSION/FORAMINOTOMY N/A 12/19/2018   Procedure: Cervical Seven to Thoracic One Posterior cervical fusion;  Surgeon: Maeola Harman, MD;  Location: [facility] OR;  Service: Neurosurgery;  Laterality: N/A;  Cervical Seven to Thoracic One Posterior cervical fusion    Social History   Socioeconomic History   Marital status: Widowed    Spouse name: Not on file   Number of children: Not on file   Years of education: Not on file   Highest education level: Not on file  Occupational History   Occupation: retired   Tobacco Use   Smoking status: Never   Smokeless tobacco: Current    Types: Snuff  Vaping Use   Vaping Use: Never used  Substance and Sexual Activity   Alcohol use: No   Drug use: No   Sexual activity: Not Currently    Birth control/protection: Surgical    Comment: hyst  Other Topics Concern   Not on file  Social History Narrative   Long term resident of [facility]    Social Determinants of Health   Financial Resource Strain: Not on file  Food Insecurity: Not on file  Transportation Needs: Not on file  Physical Activity: Not on file  Stress: Not on file  Social Connections: Not on file  Intimate Partner Violence: Not on file   Family History  Problem Relation Age of Onset  Diabetes Mother    Heart disease Brother    Hypertension Brother    Diabetes Brother    Hypertension Daughter    Diabetes Brother    Diabetes Brother    Alzheimer's disease Brother    Anxiety disorder Daughter       VITAL SIGNS BP 119/63   Pulse 94   Temp (!) 97.2 F (36.2 C)   Resp 20   Ht 5' (1.524 m)   Wt 183 lb 12.8 oz (83.4 kg)   SpO2 91%   BMI 35.90 kg/m   Outpatient Encounter Medications as of 04/05/2023  Medication Sig   acetaminophen (TYLENOL) 650 MG CR tablet Take 650 mg by mouth 3 (three) times daily.   amLODipine (NORVASC) 10 MG tablet Take 1 tablet (10 mg total) by mouth daily.   aspirin 81 MG chewable tablet Chew 81 mg by mouth daily.   busPIRone (BUSPAR) 5 MG tablet Take 5 mg by  mouth 3 (three) times daily.   carvedilol (COREG) 3.125 MG tablet Take 3.125 mg by mouth 2 (two) times daily with a meal.   chlorthalidone (HYGROTON) 25 MG tablet Take 25 mg by mouth daily.   Cranberry 500 MG CAPS Take by mouth in the morning and at bedtime. For UTI prevention   Dextromethorphan-guaiFENesin 10-100 MG/5ML liquid Take 10 mLs by mouth at bedtime as needed.   DULoxetine (CYMBALTA) 60 MG capsule Take 60 mg by mouth daily. Pain management   Dyclonine HCl 2 MG LOZG Use as directed 1 lozenge in the mouth or throat every 4 (four) hours as needed.   Insulin Glargine (BASAGLAR KWIKPEN) 100 UNIT/ML Inject 10 Units into the skin at bedtime.   ipratropium-albuterol (DUONEB) 0.5-2.5 (3) MG/3ML SOLN SMARTSIG:3 Milliliter(s) Via Nebulizer Every 6 Hours PRN   lamoTRIgine (LAMICTAL) 25 MG tablet Take 25 mg by mouth daily.   linagliptin (TRADJENTA) 5 MG TABS tablet Take 5 mg by mouth daily.   loratadine (CLARITIN) 10 MG tablet Take 10 mg by mouth daily.   Melatonin 5 MG TABS Take 5 mg by mouth at bedtime.   meloxicam (MOBIC) 15 MG tablet Take 15 mg by mouth daily. Hip Pain   Menthol, Topical Analgesic, (BIOFREEZE) 4 % GEL Apply topically. Every Shift - PRN   metFORMIN (GLUCOPHAGE) 1000 MG tablet Take 1,000 mg by mouth 2 (two) times daily with a meal.    mirabegron ER (MYRBETRIQ) 50 MG TB24 tablet Take 50 mg by mouth daily.   montelukast (SINGULAIR) 10 MG tablet Take 1 tablet (10 mg total) by mouth at bedtime.   NON FORMULARY Diet: Regular diet   omeprazole (PRILOSEC) 20 MG capsule Take 20 mg by mouth daily. Special Instructions:  *TAKE ON AN EMPTY STOMACH* *DO NOT CRUSH*   ondansetron (ZOFRAN) 4 MG tablet Take 4 mg by mouth every 6 (six) hours as needed for nausea or vomiting.   polyethylene glycol (MIRALAX / GLYCOLAX) packet Take 17 g by mouth daily.   predniSONE (DELTASONE) 20 MG tablet Take 20 mg by mouth 2 (two) times daily.   pregabalin (LYRICA) 25 MG capsule Take 1 capsule (25 mg total) by  mouth at bedtime.   PRESCRIPTION MEDICATION MICROGUARD POWDER  powder; amt: SPARINGLY; topical Special Instructions: REDNESS UNDER BREAST Once A Day   senna-docusate (SENOKOT-S) 8.6-50 MG tablet Take 1 tablet by mouth at bedtime.    sertraline (ZOLOFT) 25 MG tablet Take 25 mg by mouth daily.   simvastatin (ZOCOR) 10 MG tablet Take 10 mg by mouth at  bedtime.    triamcinolone (NASACORT) 55 MCG/ACT AERO nasal inhaler Place 1 spray into the nose daily.   valACYclovir (VALTREX) 500 MG tablet Take 500 mg by mouth daily.    No facility-administered encounter medications on file as of 04/05/2023.     SIGNIFICANT DIAGNOSTIC EXAMS  PREVIOUS   07-27-22: dexa t score: 0.636  NO NEW EXAMS    LABS REVIEWED PREVIOUS;    04-07-22: wbc 10.9; hgb 11.1; hct 35.0; mcv 82.4 plt 233; glucose 147; bun 18; creat 0.88; k+ 3.6; na++ 139; ca 87; gfr >60; protein 6.5; albumin 3.6 chol 134; ldl 49; trig 169; hdl 51; urine micro-albumin 26.2    06-30-22: glucose 150; bun 17; creat 0.81; k+ 3.8; na++ 138; ca 9.1 gfr >60 07-02-22: wbc 10.5; hgb 11.1; hct 35.4; mcv 82.5 plt 230; glucose 138; bun 18; creat 0.98; k+ 4.2; na++ 137; ca 8.9; gfr >60;  07-08-22: hgb a1c 7.8  09-26-22: hgb a1c: 8.0 12-29-22: wbc 10.0; hgb 10.8; hct 35.2 mcv 82.6 plt 212; glucose 131; bun 21; creat 0.95; k+ 3.6; na++ 136; ca 8.6; gfr >60; protein 6.6 albumin 3.6; hgb A1c 7.2; chol 132; ldl 50; trig 134; hdl 55 01-26-23: d-dimer: 0.46  TODAY  03-02-23: glucose 133; bun 23; creat 0.83; k+ 3.5; na++ 134; ca 9.1 gfr >60 03-23-23: tsh 5.410; vitamin B 12: 806   04-05-23: wbc 19.9; hgb 11.1; hct 34.6; mcv 78.8 plt 306; glucose 137; bun 48; creat 1.11; k+ 3.0; na++ 133; ca 9.0; gfr 50   Review of Systems  Constitutional:  Positive for malaise/fatigue.  HENT:  Positive for congestion and sore throat.   Respiratory:  Positive for cough and shortness of breath.   Cardiovascular:  Negative for chest pain, palpitations and leg swelling.   Gastrointestinal:  Negative for abdominal pain, constipation and heartburn.  Musculoskeletal:  Negative for back pain, joint pain and myalgias.  Skin: Negative.   Neurological:  Negative for dizziness.  Psychiatric/Behavioral:  The patient is not nervous/anxious.     Physical Exam Constitutional:      General: She is not in acute distress.    Appearance: She is well-developed. She is not diaphoretic.  HENT:     Right Ear: Ear canal normal.     Left Ear: Ear canal normal.     Nose: Nose normal.     Mouth/Throat:     Mouth: Mucous membranes are moist.     Comments: Uvula inflamed; swollen and deviated  Neck:     Thyroid: No thyromegaly.  Cardiovascular:     Rate and Rhythm: Normal rate and regular rhythm.     Heart sounds: Normal heart sounds.  Pulmonary:     Effort: Pulmonary effort is normal. No respiratory distress.     Breath sounds: Rhonchi and rales present.  Abdominal:     General: Bowel sounds are normal. There is no distension.     Palpations: Abdomen is soft.     Tenderness: There is no abdominal tenderness.  Musculoskeletal:        General: Normal range of motion.     Cervical back: Neck supple.     Right lower leg: No edema.     Left lower leg: No edema.  Lymphadenopathy:     Cervical: Cervical adenopathy present.  Skin:    General: Skin is warm and dry.  Neurological:     Mental Status: She is alert and oriented to person, place, and time.  Psychiatric:  Mood and Affect: Mood normal.       ASSESSMENT/ PLAN:   TODAY  HCAP (health care associated pneumonia): will insert picc line; will begin levaquin 750 mg IV daily through 04-09-23 and cefepime 3 gm every 8 hours through 04-11-23. Will repeat cbc; and bmp on 04-06-23.   Synthia Innocent NP [facility]  call [phone]

## 2023-04-06 ENCOUNTER — Other Ambulatory Visit (HOSPITAL_COMMUNITY)
Admission: RE | Admit: 2023-04-06 | Discharge: 2023-04-06 | Disposition: A | Discharge status: Home / Self Care | Payer: Medicare Other | Source: Skilled Nursing Facility | Attending: Adult Health | Admitting: Adult Health

## 2023-04-06 ENCOUNTER — Non-Acute Institutional Stay (SKILLED_NURSING_FACILITY): Payer: Medicare Other | Admitting: Adult Health

## 2023-04-06 ENCOUNTER — Encounter: Payer: Self-pay | Admitting: Adult Health

## 2023-04-06 DIAGNOSIS — J189 Pneumonia, unspecified organism: Secondary | ICD-10-CM | POA: Diagnosis not present

## 2023-04-06 DIAGNOSIS — N179 Acute kidney failure, unspecified: Secondary | ICD-10-CM

## 2023-04-06 DIAGNOSIS — N1832 Chronic kidney disease, stage 3b: Secondary | ICD-10-CM

## 2023-04-06 LAB — CULTURE, BLOOD (ROUTINE X 2)

## 2023-04-06 LAB — CBC WITH DIFFERENTIAL/PLATELET
Abs Immature Granulocytes: 0.3 10*3/uL — ABNORMAL HIGH (ref 0.00–0.07)
Basophils Absolute: 0.1 10*3/uL (ref 0.0–0.1)
Basophils Relative: 0 %
Eosinophils Absolute: 0.1 10*3/uL (ref 0.0–0.5)
Eosinophils Relative: 1 %
HCT: 35.3 % — ABNORMAL LOW (ref 36.0–46.0)
Hemoglobin: 11.1 g/dL — ABNORMAL LOW (ref 12.0–15.0)
Immature Granulocytes: 2 %
Lymphocytes Relative: 8 %
Lymphs Abs: 1.5 10*3/uL (ref 0.7–4.0)
MCH: 25.5 pg — ABNORMAL LOW (ref 26.0–34.0)
MCHC: 31.4 g/dL (ref 30.0–36.0)
MCV: 81 fL (ref 80.0–100.0)
Monocytes Absolute: 1.9 10*3/uL — ABNORMAL HIGH (ref 0.1–1.0)
Monocytes Relative: 10 %
Neutro Abs: 15.9 10*3/uL — ABNORMAL HIGH (ref 1.7–7.7)
Neutrophils Relative %: 79 %
Platelets: 312 10*3/uL (ref 150–400)
RBC: 4.36 MIL/uL (ref 3.87–5.11)
RDW: 17.2 % — ABNORMAL HIGH (ref 11.5–15.5)
WBC: 19.8 10*3/uL — ABNORMAL HIGH (ref 4.0–10.5)
nRBC: 0 % (ref 0.0–0.2)

## 2023-04-06 LAB — CULTURE, GROUP A STREP (THRC)

## 2023-04-06 LAB — BASIC METABOLIC PANEL
Anion gap: 15 (ref 5–15)
BUN: 40 mg/dL — ABNORMAL HIGH (ref 8–23)
CO2: 29 mmol/L (ref 22–32)
Calcium: 9.2 mg/dL (ref 8.9–10.3)
Chloride: 93 mmol/L — ABNORMAL LOW (ref 98–111)
Creatinine, Ser: 1.21 mg/dL — ABNORMAL HIGH (ref 0.44–1.00)
GFR, Estimated: 45 mL/min — ABNORMAL LOW (ref >60)
Glucose, Bld: 95 mg/dL (ref 70–99)
Potassium: 4 mmol/L (ref 3.5–5.1)
Sodium: 137 mmol/L (ref 135–145)

## 2023-04-06 NOTE — Progress Notes (Signed)
Location:  [facility] Room Number: 115W Place of Service:  SNF (31)   CODE STATUS: Full CODE  Allergies  Allergen Reactions   Gabapentin    Lisinopril    Penicillins Nausea Only    Has patient had a PCN reaction causing immediate rash, facial/tongue/throat swelling, SOB or lightheadedness with hypotension: Yes Has patient had a PCN reaction causing severe rash involving mucus membranes or skin necrosis: No Has patient had a PCN reaction that required hospitalization No Has patient had a PCN reaction occurring within the last 10 years: No If all of the above answers are "NO", then may proceed with Cephalosporin use.    Sulfa Antibiotics Nausea And Vomiting    Chief Complaint  Patient presents with   Acute Visit    Patient is being seen for renal failure    HPI:  Her renal function is worse.  Her bun 40; creat 1.21 gfr 45 are all worse. She is presently on IV abt for pneumonia. She tells me that she is starting to feel a little bit better today. There are no reports of fevers today.   Past Medical History:  Diagnosis Date   Anxiety    Bowel obstruction    Burning with urination 01/04/2016   Diabetes mellitus with neuropathy    Genital herpes    GERD (gastroesophageal reflux disease)    Hematuria 01/04/2016   Herpes 12/22/2015   Hyperlipidemia    Hypertension    LLQ abdominal tenderness 01/04/2016   Neuropathy    Pelvic pressure in female 01/04/2016   Recurrent UTI    Sciatic nerve disease    Small bowel obstruction 04/27/2016   Urinary frequency 01/04/2016   Urticaria     Past Surgical History:  Procedure Laterality Date   ABDOMINAL HYSTERECTOMY     APPLICATION OF INTRAOPERATIVE CT SCAN N/A 12/19/2018   Procedure: APPLICATION OF INTRAOPERATIVE CT SCAN;  Surgeon: Maeola Harman, MD;  Location: [facility] OR;  Service: Neurosurgery;  Laterality: N/A;   bowel obstruction     COLON SURGERY     blocked colon   POSTERIOR CERVICAL FUSION/FORAMINOTOMY  N/A 12/19/2018   Procedure: Cervical Seven to Thoracic One Posterior cervical fusion;  Surgeon: Maeola Harman, MD;  Location: [facility] OR;  Service: Neurosurgery;  Laterality: N/A;  Cervical Seven to Thoracic One Posterior cervical fusion    Social History   Socioeconomic History   Marital status: Widowed    Spouse name: Not on file   Number of children: Not on file   Years of education: Not on file   Highest education level: Not on file  Occupational History   Occupation: retired   Tobacco Use   Smoking status: Never   Smokeless tobacco: Current    Types: Snuff  Vaping Use   Vaping Use: Never used  Substance and Sexual Activity   Alcohol use: No   Drug use: No   Sexual activity: Not Currently    Birth control/protection: Surgical    Comment: hyst  Other Topics Concern   Not on file  Social History Narrative   Long term resident of [facility]    Social Determinants of Health   Financial Resource Strain: Not on file  Food Insecurity: Not on file  Transportation Needs: Not on file  Physical Activity: Not on file  Stress: Not on file  Social Connections: Not on file  Intimate Partner Violence: Not on file   Family History  Problem Relation Age of Onset   Diabetes Mother  Heart disease Brother    Hypertension Brother    Diabetes Brother    Hypertension Daughter    Diabetes Brother    Diabetes Brother    Alzheimer's disease Brother    Anxiety disorder Daughter       VITAL SIGNS BP 119/63   Pulse 94   Temp (!) 97.2 F (36.2 C) (Temporal)   Resp 20   Ht 5' (1.524 m)   Wt 183 lb 12.8 oz (83.4 kg)   SpO2 91%   BMI 35.90 kg/m   Outpatient Encounter Medications as of 04/06/2023  Medication Sig   acetaminophen (TYLENOL) 650 MG CR tablet Take 650 mg by mouth 3 (three) times daily.   amLODipine (NORVASC) 10 MG tablet Take 1 tablet (10 mg total) by mouth daily.   aspirin 81 MG chewable tablet Chew 81 mg by mouth daily.   busPIRone (BUSPAR) 5 MG tablet Take 5 mg by mouth 3  (three) times daily.   carvedilol (COREG) 3.125 MG tablet Take 3.125 mg by mouth 2 (two) times daily with a meal.   chlorthalidone (HYGROTON) 25 MG tablet Take 25 mg by mouth daily.   Cranberry 500 MG CAPS Take by mouth in the morning and at bedtime. For UTI prevention   Dextromethorphan-guaiFENesin 10-100 MG/5ML liquid Take 10 mLs by mouth at bedtime as needed.   DULoxetine (CYMBALTA) 60 MG capsule Take 60 mg by mouth daily. Pain management   Dyclonine HCl 2 MG LOZG Use as directed 1 lozenge in the mouth or throat every 4 (four) hours as needed.   Insulin Glargine (BASAGLAR KWIKPEN) 100 UNIT/ML Inject 10 Units into the skin at bedtime.   ipratropium-albuterol (DUONEB) 0.5-2.5 (3) MG/3ML SOLN SMARTSIG:3 Milliliter(s) Via Nebulizer Every 6 Hours PRN   lamoTRIgine (LAMICTAL) 25 MG tablet Take 25 mg by mouth daily.   levofloxacin (LEVAQUIN) 25 MG/ML solution Place 750 mg into feeding tube daily.   linagliptin (TRADJENTA) 5 MG TABS tablet Take 5 mg by mouth daily.   loratadine (CLARITIN) 10 MG tablet Take 10 mg by mouth daily.   Melatonin 5 MG TABS Take 5 mg by mouth at bedtime.   meloxicam (MOBIC) 15 MG tablet Take 15 mg by mouth daily. Hip Pain   Menthol, Topical Analgesic, (BIOFREEZE) 4 % GEL Apply topically. Every Shift - PRN   metFORMIN (GLUCOPHAGE) 1000 MG tablet Take 1,000 mg by mouth 2 (two) times daily with a meal.    mirabegron ER (MYRBETRIQ) 50 MG TB24 tablet Take 50 mg by mouth daily.   montelukast (SINGULAIR) 10 MG tablet Take 1 tablet (10 mg total) by mouth at bedtime.   NON FORMULARY Diet: Regular diet   omeprazole (PRILOSEC) 20 MG capsule Take 20 mg by mouth daily. Special Instructions:  *TAKE ON AN EMPTY STOMACH* *DO NOT CRUSH*   ondansetron (ZOFRAN) 4 MG tablet Take 4 mg by mouth every 6 (six) hours as needed for nausea or vomiting.   polyethylene glycol (MIRALAX / GLYCOLAX) packet Take 17 g by mouth daily.   predniSONE (DELTASONE) 20 MG tablet Take 20 mg by mouth 2 (two) times  daily.   pregabalin (LYRICA) 25 MG capsule Take 1 capsule (25 mg total) by mouth at bedtime.   PRESCRIPTION MEDICATION MICROGUARD POWDER  powder; amt: SPARINGLY; topical Special Instructions: REDNESS UNDER BREAST Once A Day   senna-docusate (SENOKOT-S) 8.6-50 MG tablet Take 1 tablet by mouth at bedtime.    sertraline (ZOLOFT) 25 MG tablet Take 25 mg by mouth daily.   simvastatin (  ZOCOR) 10 MG tablet Take 10 mg by mouth at bedtime.    triamcinolone (NASACORT) 55 MCG/ACT AERO nasal inhaler Place 1 spray into the nose daily.   valACYclovir (VALTREX) 500 MG tablet Take 500 mg by mouth daily.    No facility-administered encounter medications on file as of 04/06/2023.     SIGNIFICANT DIAGNOSTIC EXAMS  PREVIOUS   07-27-22: dexa t score: 0.636  NO NEW EXAMS    LABS REVIEWED PREVIOUS;    04-07-22: wbc 10.9; hgb 11.1; hct 35.0; mcv 82.4 plt 233; glucose 147; bun 18; creat 0.88; k+ 3.6; na++ 139; ca 87; gfr >60; protein 6.5; albumin 3.6 chol 134; ldl 49; trig 169; hdl 51; urine micro-albumin 26.2    06-30-22: glucose 150; bun 17; creat 0.81; k+ 3.8; na++ 138; ca 9.1 gfr >60 07-02-22: wbc 10.5; hgb 11.1; hct 35.4; mcv 82.5 plt 230; glucose 138; bun 18; creat 0.98; k+ 4.2; na++ 137; ca 8.9; gfr >60;  07-08-22: hgb a1c 7.8  09-26-22: hgb a1c: 8.0 12-29-22: wbc 10.0; hgb 10.8; hct 35.2 mcv 82.6 plt 212; glucose 131; bun 21; creat 0.95; k+ 3.6; na++ 136; ca 8.6; gfr >60; protein 6.6 albumin 3.6; hgb A1c 7.2; chol 132; ldl 50; trig 134; hdl 55 01-26-23: d-dimer: 0.46  TODAY  03-02-23: glucose 133; bun 23; creat 0.83; k+ 3.5; na++ 134; ca 9.1 gfr >60 03-23-23: tsh 5.410; vitamin B 12: 806   04-05-23: wbc 19.9; hgb 11.1; hct 34.6; mcv 78.8 plt 306; glucose 137; bun 48; creat 1.11; k+ 3.0; na++ 133; ca 9.0; gfr 50  04-06-23; wbc 19.8; hgb 11.1; hct 35.3; mcv 81.0 plt 312; glucose 95; bun 40; creat 1.21; k+ 4.0; na++ 137; ca 9.2; gfr 45   Review of Systems  Constitutional:  Positive for malaise/fatigue.   HENT:  Positive for sore throat.   Respiratory:  Positive for cough and shortness of breath.   Cardiovascular:  Negative for chest pain, palpitations and leg swelling.  Gastrointestinal:  Negative for abdominal pain, constipation and heartburn.  Musculoskeletal:  Negative for back pain, joint pain and myalgias.  Skin: Negative.   Neurological:  Negative for dizziness.  Psychiatric/Behavioral:  The patient is not nervous/anxious.     Physical Exam Constitutional:      General: She is not in acute distress.    Appearance: She is well-developed. She is obese. She is not diaphoretic.  Neck:     Thyroid: No thyromegaly.  Cardiovascular:     Rate and Rhythm: Normal rate and regular rhythm.     Pulses: Normal pulses.     Heart sounds: Normal heart sounds.  Pulmonary:     Effort: Pulmonary effort is normal. No respiratory distress.     Breath sounds: Rhonchi present.  Abdominal:     General: Bowel sounds are normal. There is no distension.     Palpations: Abdomen is soft.     Tenderness: There is no abdominal tenderness.  Musculoskeletal:        General: Normal range of motion.     Cervical back: Neck supple.     Right lower leg: No edema.     Left lower leg: No edema.  Lymphadenopathy:     Cervical: No cervical adenopathy.  Skin:    General: Skin is warm and dry.  Neurological:     Mental Status: She is alert and oriented to person, place, and time.  Psychiatric:        Mood and Affect: Mood normal.  ASSESSMENT/ PLAN:  TODAY  Acute renal failure superimposed on stage 3b chronic kidney disease unspecified acute renal failure type:  will give 1 liter NS at 75 cc per hour and will then repeat labs.    Synthia Innocent NP [facility]  call [phone]

## 2023-04-07 DIAGNOSIS — J189 Pneumonia, unspecified organism: Secondary | ICD-10-CM | POA: Insufficient documentation

## 2023-04-07 LAB — CULTURE, BLOOD (ROUTINE X 2): Culture: NO GROWTH

## 2023-04-07 LAB — CULTURE, GROUP A STREP (THRC)

## 2023-04-08 LAB — CULTURE, GROUP A STREP (THRC)

## 2023-04-09 LAB — CULTURE, BLOOD (ROUTINE X 2): Culture: NO GROWTH

## 2023-04-10 ENCOUNTER — Non-Acute Institutional Stay (SKILLED_NURSING_FACILITY): Payer: Medicare Other | Admitting: Adult Health

## 2023-04-10 ENCOUNTER — Encounter: Payer: Self-pay | Admitting: Adult Health

## 2023-04-10 ENCOUNTER — Other Ambulatory Visit (HOSPITAL_COMMUNITY)
Admission: RE | Admit: 2023-04-10 | Discharge: 2023-04-10 | Disposition: A | Discharge status: Home / Self Care | Payer: Medicare Other | Source: Skilled Nursing Facility | Attending: Adult Health | Admitting: Adult Health

## 2023-04-10 DIAGNOSIS — J189 Pneumonia, unspecified organism: Secondary | ICD-10-CM

## 2023-04-10 DIAGNOSIS — N1832 Chronic kidney disease, stage 3b: Secondary | ICD-10-CM | POA: Diagnosis not present

## 2023-04-10 DIAGNOSIS — E1159 Type 2 diabetes mellitus with other circulatory complications: Secondary | ICD-10-CM | POA: Insufficient documentation

## 2023-04-10 DIAGNOSIS — N179 Acute kidney failure, unspecified: Secondary | ICD-10-CM

## 2023-04-10 DIAGNOSIS — I152 Hypertension secondary to endocrine disorders: Secondary | ICD-10-CM | POA: Insufficient documentation

## 2023-04-10 LAB — CBC WITH DIFFERENTIAL/PLATELET
Abs Immature Granulocytes: 0.27 10*3/uL — ABNORMAL HIGH (ref 0.00–0.07)
Basophils Absolute: 0.1 10*3/uL (ref 0.0–0.1)
Basophils Relative: 1 %
Eosinophils Absolute: 0.4 10*3/uL (ref 0.0–0.5)
Eosinophils Relative: 3 %
HCT: 34.2 % — ABNORMAL LOW (ref 36.0–46.0)
Hemoglobin: 10.7 g/dL — ABNORMAL LOW (ref 12.0–15.0)
Immature Granulocytes: 2 %
Lymphocytes Relative: 13 %
Lymphs Abs: 1.9 10*3/uL (ref 0.7–4.0)
MCH: 24.8 pg — ABNORMAL LOW (ref 26.0–34.0)
MCHC: 31.3 g/dL (ref 30.0–36.0)
MCV: 79.4 fL — ABNORMAL LOW (ref 80.0–100.0)
Monocytes Absolute: 1.4 10*3/uL — ABNORMAL HIGH (ref 0.1–1.0)
Monocytes Relative: 10 %
Neutro Abs: 10.6 10*3/uL — ABNORMAL HIGH (ref 1.7–7.7)
Neutrophils Relative %: 71 %
Platelets: 236 10*3/uL (ref 150–400)
RBC: 4.31 MIL/uL (ref 3.87–5.11)
RDW: 17.3 % — ABNORMAL HIGH (ref 11.5–15.5)
WBC: 14.6 10*3/uL — ABNORMAL HIGH (ref 4.0–10.5)
nRBC: 0 % (ref 0.0–0.2)

## 2023-04-10 LAB — BASIC METABOLIC PANEL
Anion gap: 12 (ref 5–15)
BUN: 21 mg/dL (ref 8–23)
CO2: 27 mmol/L (ref 22–32)
Calcium: 8.7 mg/dL — ABNORMAL LOW (ref 8.9–10.3)
Chloride: 93 mmol/L — ABNORMAL LOW (ref 98–111)
Creatinine, Ser: 0.84 mg/dL (ref 0.44–1.00)
GFR, Estimated: >60 mL/min (ref >60)
Glucose, Bld: 106 mg/dL — ABNORMAL HIGH (ref 70–99)
Potassium: 3.2 mmol/L — ABNORMAL LOW (ref 3.5–5.1)
Sodium: 132 mmol/L — ABNORMAL LOW (ref 135–145)

## 2023-04-10 LAB — CULTURE, BLOOD (ROUTINE X 2): Special Requests: ADEQUATE

## 2023-04-10 NOTE — Progress Notes (Unsigned)
Location:  [facility] Room Number: 115 W Place of Service:  SNF (31)   CODE STATUS: Full Code   Allergies  Allergen Reactions   Gabapentin    Lisinopril    Penicillins Nausea Only    Has patient had a PCN reaction causing immediate rash, facial/tongue/throat swelling, SOB or lightheadedness with hypotension: Yes Has patient had a PCN reaction causing severe rash involving mucus membranes or skin necrosis: No Has patient had a PCN reaction that required hospitalization No Has patient had a PCN reaction occurring within the last 10 years: No If all of the above answers are "NO", then may proceed with Cephalosporin use.    Sulfa Antibiotics Nausea And Vomiting    Chief Complaint  Patient presents with   Follow-up    Discuss recent labs     HPI:  Her renal function has improved after completing her ivf. She states that her sore throat is much better she remains hoarse with shortness of breath present. There are no reports of further fevers. She remains on 02 at this time. She is on IV abt at this time.   Past Medical History:  Diagnosis Date   Anxiety    Bowel obstruction (HCC)    Burning with urination 01/04/2016   Diabetes mellitus with neuropathy    Genital herpes    GERD (gastroesophageal reflux disease)    Hematuria 01/04/2016   Herpes 12/22/2015   Hyperlipidemia    Hypertension    LLQ abdominal tenderness 01/04/2016   Neuropathy    Pelvic pressure in female 01/04/2016   Recurrent UTI    Sciatic nerve disease    Small bowel obstruction (HCC) 04/27/2016   Urinary frequency 01/04/2016   Urticaria     Past Surgical History:  Procedure Laterality Date   ABDOMINAL HYSTERECTOMY     APPLICATION OF INTRAOPERATIVE CT SCAN N/A 12/19/2018   Procedure: APPLICATION OF INTRAOPERATIVE CT SCAN;  Surgeon: Maeola Harman, MD;  Location: [facility] OR;  Service: Neurosurgery;  Laterality: N/A;   bowel obstruction     COLON SURGERY     blocked colon   POSTERIOR  CERVICAL FUSION/FORAMINOTOMY N/A 12/19/2018   Procedure: Cervical Seven to Thoracic One Posterior cervical fusion;  Surgeon: Maeola Harman, MD;  Location: [facility] OR;  Service: Neurosurgery;  Laterality: N/A;  Cervical Seven to Thoracic One Posterior cervical fusion    Social History   Socioeconomic History   Marital status: Widowed    Spouse name: Not on file   Number of children: Not on file   Years of education: Not on file   Highest education level: Not on file  Occupational History   Occupation: retired   Tobacco Use   Smoking status: Never   Smokeless tobacco: Current    Types: Snuff  Vaping Use   Vaping Use: Never used  Substance and Sexual Activity   Alcohol use: No   Drug use: No   Sexual activity: Not Currently    Birth control/protection: Surgical    Comment: hyst  Other Topics Concern   Not on file  Social History Narrative   Long term resident of [facility]    Social Determinants of Health   Financial Resource Strain: Not on file  Food Insecurity: Not on file  Transportation Needs: Not on file  Physical Activity: Not on file  Stress: Not on file  Social Connections: Not on file  Intimate Partner Violence: Not on file   Family History  Problem Relation Age of Onset  Diabetes Mother    Heart disease Brother    Hypertension Brother    Diabetes Brother    Hypertension Daughter    Diabetes Brother    Diabetes Brother    Alzheimer's disease Brother    Anxiety disorder Daughter       VITAL SIGNS BP (!) 142/80   Pulse 82   Temp (!) 97.5 F (36.4 C)   Ht 5' (1.524 m)   Wt 183 lb (83 kg)   SpO2 98%   BMI 35.74 kg/m   Outpatient Encounter Medications as of 04/10/2023  Medication Sig   acetaminophen (TYLENOL) 650 MG CR tablet Take 650 mg by mouth 3 (three) times daily.   amLODipine (NORVASC) 10 MG tablet Take 1 tablet (10 mg total) by mouth daily.   aspirin 81 MG chewable tablet Chew 81 mg by mouth daily.   busPIRone (BUSPAR) 5 MG tablet Take 5 mg by mouth 3  (three) times daily.   carvedilol (COREG) 3.125 MG tablet Take 3.125 mg by mouth 2 (two) times daily with a meal.   ceFEPIme 1 g in sodium chloride 0.9 % 100 mL Inject 1 g into the vein every 8 (eight) hours.   chlorthalidone (HYGROTON) 25 MG tablet Take 12.5 mg by mouth daily.   Cranberry 500 MG CAPS Take by mouth in the morning and at bedtime. For UTI prevention   Dextromethorphan-guaiFENesin 10-100 MG/5ML liquid Take 10 mLs by mouth at bedtime as needed.   DULoxetine (CYMBALTA) 60 MG capsule Take 60 mg by mouth daily. Pain management   Dyclonine HCl 2 MG LOZG Use as directed 1 lozenge in the mouth or throat every 4 (four) hours as needed.   Insulin Glargine (BASAGLAR KWIKPEN) 100 UNIT/ML Inject 10 Units into the skin at bedtime.   ipratropium-albuterol (DUONEB) 0.5-2.5 (3) MG/3ML SOLN 3 mLs every 6 (six) hours.   lamoTRIgine (LAMICTAL) 25 MG tablet Take 25 mg by mouth daily.   linagliptin (TRADJENTA) 5 MG TABS tablet Take 5 mg by mouth daily.   loratadine (CLARITIN) 10 MG tablet Take 10 mg by mouth daily.   Melatonin 5 MG TABS Take 5 mg by mouth at bedtime.   meloxicam (MOBIC) 15 MG tablet Take 15 mg by mouth daily. Hip Pain   Menthol, Topical Analgesic, (BIOFREEZE) 4 % GEL Apply topically. Every Shift - PRN   metFORMIN (GLUCOPHAGE) 1000 MG tablet Take 1,000 mg by mouth 2 (two) times daily with a meal.    mirabegron ER (MYRBETRIQ) 50 MG TB24 tablet Take 50 mg by mouth daily.   montelukast (SINGULAIR) 10 MG tablet Take 1 tablet (10 mg total) by mouth at bedtime.   NON FORMULARY Diet: Regular diet   omeprazole (PRILOSEC) 20 MG capsule Take 20 mg by mouth daily. Special Instructions:  *TAKE ON AN EMPTY STOMACH* *DO NOT CRUSH*   ondansetron (ZOFRAN) 4 MG tablet Take 4 mg by mouth every 6 (six) hours as needed for nausea or vomiting.   polyethylene glycol (MIRALAX / GLYCOLAX) packet Take 17 g by mouth daily.   pregabalin (LYRICA) 25 MG capsule Take 1 capsule (25 mg total) by mouth at bedtime.    PRESCRIPTION MEDICATION MICROGUARD POWDER  powder; amt: SPARINGLY; topical Special Instructions: REDNESS UNDER BREAST Once A Day   senna-docusate (SENOKOT-S) 8.6-50 MG tablet Take 1 tablet by mouth at bedtime.    sertraline (ZOLOFT) 25 MG tablet Take 25 mg by mouth daily.   simvastatin (ZOCOR) 10 MG tablet Take 10 mg by mouth at bedtime.  triamcinolone (NASACORT) 55 MCG/ACT AERO nasal inhaler Place 1 spray into the nose daily.   valACYclovir (VALTREX) 500 MG tablet Take 500 mg by mouth daily.    [DISCONTINUED] levofloxacin (LEVAQUIN) 25 MG/ML solution Place 750 mg into feeding tube daily.   [DISCONTINUED] predniSONE (DELTASONE) 20 MG tablet Take 20 mg by mouth 2 (two) times daily.   No facility-administered encounter medications on file as of 04/10/2023.     SIGNIFICANT DIAGNOSTIC EXAMS  PREVIOUS   07-27-22: dexa t score: 0.636  NO NEW EXAMS    LABS REVIEWED PREVIOUS;    04-07-22: wbc 10.9; hgb 11.1; hct 35.0; mcv 82.4 plt 233; glucose 147; bun 18; creat 0.88; k+ 3.6; na++ 139; ca 87; gfr >60; protein 6.5; albumin 3.6 chol 134; ldl 49; trig 169; hdl 51; urine micro-albumin 26.2    06-30-22: glucose 150; bun 17; creat 0.81; k+ 3.8; na++ 138; ca 9.1 gfr >60 07-02-22: wbc 10.5; hgb 11.1; hct 35.4; mcv 82.5 plt 230; glucose 138; bun 18; creat 0.98; k+ 4.2; na++ 137; ca 8.9; gfr >60;  07-08-22: hgb a1c 7.8  09-26-22: hgb a1c: 8.0 12-29-22: wbc 10.0; hgb 10.8; hct 35.2 mcv 82.6 plt 212; glucose 131; bun 21; creat 0.95; k+ 3.6; na++ 136; ca 8.6; gfr >60; protein 6.6 albumin 3.6; hgb A1c 7.2; chol 132; ldl 50; trig 134; hdl 55 01-26-23: d-dimer: 0.46  TODAY  03-02-23: glucose 133; bun 23; creat 0.83; k+ 3.5; na++ 134; ca 9.1 gfr >60 03-23-23: tsh 5.410; vitamin B 12: 806   04-05-23: wbc 19.9; hgb 11.1; hct 34.6; mcv 78.8 plt 306; glucose 137; bun 48; creat 1.11; k+ 3.0; na++ 133; ca 9.0; gfr 50  04-06-23; wbc 19.8; hgb 11.1; hct 35.3; mcv 81.0 plt 312; glucose 95; bun 40; creat 1.21; k+ 4.0;  na++ 137; ca 9.2; gfr 45  04-10-23: wbc 14.6; hgb 10.7; hct 34.2; mcv 79.4 plt 236; glucose 106; bun 21; creat 0.84; k+ 3.2; na++ 132; ca 8.7 gfr >60  Review of Systems  Constitutional:  Positive for malaise/fatigue.  HENT:  Negative for sore throat.   Respiratory:  Positive for shortness of breath. Negative for cough.   Cardiovascular:  Negative for chest pain, palpitations and leg swelling.  Gastrointestinal:  Negative for abdominal pain, constipation and heartburn.  Musculoskeletal:  Negative for back pain, joint pain and myalgias.  Skin: Negative.   Neurological:  Negative for dizziness.  Psychiatric/Behavioral:  The patient is not nervous/anxious.     Physical Exam Constitutional:      General: She is not in acute distress.    Appearance: She is well-developed. She is obese. She is not diaphoretic.  Neck:     Thyroid: No thyromegaly.  Cardiovascular:     Rate and Rhythm: Normal rate and regular rhythm.     Pulses: Normal pulses.     Heart sounds: Normal heart sounds.  Pulmonary:     Effort: Pulmonary effort is normal. No respiratory distress.     Breath sounds: Rhonchi present.     Comments: Few scattered  Abdominal:     General: Bowel sounds are normal. There is no distension.     Palpations: Abdomen is soft.     Tenderness: There is no abdominal tenderness.  Musculoskeletal:        General: Normal range of motion.     Cervical back: Neck supple.     Right lower leg: No edema.     Left lower leg: No edema.  Lymphadenopathy:  Cervical: No cervical adenopathy.  Skin:    General: Skin is warm and dry.  Neurological:     Mental Status: She is alert and oriented to person, place, and time.  Psychiatric:        Mood and Affect: Mood normal.       ASSESSMENT/ PLAN:  TODAY  Health care associated pneumonia Acute renal failure superimposed on stage 3b chronic kidney disease unspecified renal failure type  Will begin advair 100/50 1 puff twice daily for one  discus; will monitor her status.    Synthia Innocent NP [facility]  call [phone]

## 2023-04-11 DIAGNOSIS — N1832 Chronic kidney disease, stage 3b: Secondary | ICD-10-CM | POA: Insufficient documentation

## 2023-04-11 DIAGNOSIS — N179 Acute kidney failure, unspecified: Secondary | ICD-10-CM | POA: Insufficient documentation

## 2023-04-12 DIAGNOSIS — R498 Other voice and resonance disorders: Secondary | ICD-10-CM | POA: Diagnosis not present

## 2023-04-12 DIAGNOSIS — R488 Other symbolic dysfunctions: Secondary | ICD-10-CM | POA: Diagnosis not present

## 2023-04-12 DIAGNOSIS — F0390 Unspecified dementia without behavioral disturbance: Secondary | ICD-10-CM | POA: Diagnosis not present

## 2023-04-13 ENCOUNTER — Other Ambulatory Visit (HOSPITAL_COMMUNITY)
Admission: RE | Admit: 2023-04-13 | Discharge: 2023-04-13 | Disposition: A | Discharge status: Home / Self Care | Payer: Medicare Other | Source: Skilled Nursing Facility | Attending: Adult Health | Admitting: Adult Health

## 2023-04-13 DIAGNOSIS — I152 Hypertension secondary to endocrine disorders: Secondary | ICD-10-CM | POA: Diagnosis not present

## 2023-04-13 LAB — CBC WITH DIFFERENTIAL/PLATELET
Abs Immature Granulocytes: 0.11 10*3/uL — ABNORMAL HIGH (ref 0.00–0.07)
Basophils Absolute: 0.1 10*3/uL (ref 0.0–0.1)
Basophils Relative: 1 %
Eosinophils Absolute: 0.5 10*3/uL (ref 0.0–0.5)
Eosinophils Relative: 5 %
HCT: 32.3 % — ABNORMAL LOW (ref 36.0–46.0)
Hemoglobin: 10 g/dL — ABNORMAL LOW (ref 12.0–15.0)
Immature Granulocytes: 1 %
Lymphocytes Relative: 22 %
Lymphs Abs: 2.3 10*3/uL (ref 0.7–4.0)
MCH: 25.4 pg — ABNORMAL LOW (ref 26.0–34.0)
MCHC: 31 g/dL (ref 30.0–36.0)
MCV: 82.2 fL (ref 80.0–100.0)
Monocytes Absolute: 1.1 10*3/uL — ABNORMAL HIGH (ref 0.1–1.0)
Monocytes Relative: 11 %
Neutro Abs: 6.4 10*3/uL (ref 1.7–7.7)
Neutrophils Relative %: 60 %
Platelets: 219 10*3/uL (ref 150–400)
RBC: 3.93 MIL/uL (ref 3.87–5.11)
RDW: 17.6 % — ABNORMAL HIGH (ref 11.5–15.5)
WBC: 10.6 10*3/uL — ABNORMAL HIGH (ref 4.0–10.5)
nRBC: 0 % (ref 0.0–0.2)

## 2023-04-13 LAB — BASIC METABOLIC PANEL
Anion gap: 13 (ref 5–15)
BUN: 22 mg/dL (ref 8–23)
CO2: 24 mmol/L (ref 22–32)
Calcium: 8.5 mg/dL — ABNORMAL LOW (ref 8.9–10.3)
Chloride: 99 mmol/L (ref 98–111)
Creatinine, Ser: 0.92 mg/dL (ref 0.44–1.00)
GFR, Estimated: >60 mL/min (ref >60)
Glucose, Bld: 96 mg/dL (ref 70–99)
Potassium: 3 mmol/L — ABNORMAL LOW (ref 3.5–5.1)
Sodium: 136 mmol/L (ref 135–145)

## 2023-04-17 ENCOUNTER — Other Ambulatory Visit (HOSPITAL_COMMUNITY)
Admission: RE | Admit: 2023-04-17 | Discharge: 2023-04-17 | Disposition: A | Discharge status: Home / Self Care | Payer: Medicare Other | Source: Skilled Nursing Facility | Attending: Adult Health | Admitting: Adult Health

## 2023-04-17 DIAGNOSIS — E876 Hypokalemia: Secondary | ICD-10-CM | POA: Insufficient documentation

## 2023-04-17 LAB — POTASSIUM: Potassium: 4.1 mmol/L (ref 3.5–5.1)

## 2023-04-19 ENCOUNTER — Other Ambulatory Visit: Payer: Self-pay | Admitting: Adult Health

## 2023-04-19 MED ORDER — PREGABALIN 25 MG PO CAPS: 25.0000 mg | ORAL_CAPSULE | Freq: Every day | ORAL | 0 refills | Status: DC

## 2023-04-28 ENCOUNTER — Non-Acute Institutional Stay (SKILLED_NURSING_FACILITY): Payer: Medicare Other | Admitting: Internal Medicine

## 2023-04-28 ENCOUNTER — Encounter: Payer: Self-pay | Admitting: Internal Medicine

## 2023-04-28 DIAGNOSIS — I700 Atherosclerosis of aorta: Secondary | ICD-10-CM

## 2023-04-28 DIAGNOSIS — F339 Major depressive disorder, recurrent, unspecified: Secondary | ICD-10-CM

## 2023-04-28 DIAGNOSIS — E1122 Type 2 diabetes mellitus with diabetic chronic kidney disease: Secondary | ICD-10-CM

## 2023-04-28 DIAGNOSIS — I152 Hypertension secondary to endocrine disorders: Secondary | ICD-10-CM

## 2023-04-28 DIAGNOSIS — N183 Chronic kidney disease, stage 3 unspecified: Secondary | ICD-10-CM | POA: Diagnosis not present

## 2023-04-28 DIAGNOSIS — E1159 Type 2 diabetes mellitus with other circulatory complications: Secondary | ICD-10-CM

## 2023-04-28 NOTE — Patient Instructions (Signed)
See assessment and plan under each diagnosis in the problem list and acutely for this visit 

## 2023-04-28 NOTE — Assessment & Plan Note (Signed)
She denies any anginal equivalent.  Her prior dyspnea has resolved.

## 2023-04-28 NOTE — Assessment & Plan Note (Signed)
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  This should allow substituting spironolactone for chlorthalidone because of recurrent hypokalemia.

## 2023-04-28 NOTE — Assessment & Plan Note (Signed)
She is animated and communicative; clinically there is no evidence of active depression.  She does describe some "nervousness" which is associated with her scratching and inducing eschar.

## 2023-04-28 NOTE — Progress Notes (Addendum)
NURSING HOME LOCATION:  [facility] ROOM NUMBER:  115W  CODE STATUS:  Full Code  PCP:  Synthia Innocent NP  This is a nursing facility follow up visit of chronic medical diagnoses & to document compliance with Regulation 483.30 (c) in The Long Term Care Survey Manual Phase 2 which mandates caregiver visit ( visits can alternate among physician, PA or NP as per statutes) within 10 days of 30 days / 60 days/ 90 days post admission to SNF date    Interim medical record and care since last SNF visit was updated with review of diagnostic studies and change in clinical status since last visit were documented.  HPI: She is a permanent resident of this facility with medical diagnoses of history of bowel obstruction, diabetes complicated by neuropathy, GERD, dyslipidemia, essential hypertension, and history of sciatica. On 04/13/2023 potassium was 3.0 but this was repleted with follow-up potassium of 4.1. On 2 prior occasions potassium was 3.0 and 3.2. This is in the context of chlorthalidone therapy for hypertension.  Calcium was 8.5 down slightly from a prior value of 8.7.  White count had previously been 19,800 in the context of pneumonia; this dropped sequentially to most recent values of 10,600.  There is been slight progression of anemia with current H/H of 10/32.3.  The last TSH on record was 5.410 on 4/11.  A1c was 7.2% on 12/29/2022.  Review of systems: Profound hearing loss hindered review of system completion.  She did validate that she had had pneumonia with productive cough and dyspnea.  Those symptoms have resolved. She states that she was sleeping when staff came in to give her her medications.  She said she awoke suddenly and saw a "beautiful woman standing behind in the shadows." She was not alarmed by this hallucination and states that this is an isolated phenomenon. She denies anxiety or depression but then stated that that she tended to scratch her shin, causing eschar  formation because she was nervous.  She does have a prior history of anxiety as well as urticaria.  She denies any extrinsic symptoms at this time.  Constitutional: No fever, significant weight change, fatigue  Eyes: No redness, discharge, pain, vision change ENT/mouth: No nasal congestion,  purulent discharge, earache, change in hearing, sore throat  Cardiovascular: No chest pain, palpitations, paroxysmal nocturnal dyspnea, claudication, edema  Respiratory: No cough, sputum production, hemoptysis, DOE, significant snoring, apnea   Gastrointestinal: No heartburn, dysphagia, abdominal pain, nausea /vomiting, rectal bleeding, melena, change in bowels Genitourinary: No dysuria, hematuria, pyuria, incontinence, nocturia Musculoskeletal: No joint stiffness, joint swelling, weakness, pain Neurologic: No dizziness, headache, syncope, seizures, numbness, tingling Psychiatric: No significant insomnia, anorexia Endocrine: No change in hair/nails, excessive thirst, excessive hunger, excessive urination  Hematologic/lymphatic: No significant bruising, lymphadenopathy, abnormal bleeding Allergy/immunology: No itchy/watery eyes, significant sneezing, urticaria, angioedema  Physical exam:  Pertinent or positive findings: She was initially asleep and exhibited no hypopnea, snoring, or frank apnea.  She did arouse easily with a start.  As noted she is profoundly hard of hearing.  She exhibits a slightly garbled hyponasal speech pattern.  The tip of the nose is deformed on the left.  She is edentulous.  She has minor scattered low-grade rales.  Abdomen is protuberant.  Pedal pulses are decreased.  She has 5 small eschar over the left shin without associated cellulitis.  General appearance: Adequately nourished; no acute distress, increased work of breathing is present.   Lymphatic: No lymphadenopathy about the head, neck, axilla.  Eyes: No conjunctival inflammation or lid edema is present. There is no scleral  icterus. Ears:  External ear exam shows no significant lesions or deformities.   Nose:  External nasal examination shows no deformity or inflammation. Nasal mucosa are pink and moist without lesions, exudates Neck:  No thyromegaly, masses, tenderness noted.    Heart:  Normal rate and regular rhythm. S1 and S2 normal without gallop, murmur, click, rub .  Lungs:  without wheezes, rhonchi, rubs. Abdomen: Bowel sounds are normal. Abdomen is soft and nontender with no organomegaly, hernias, masses. GU: Deferred  Extremities:  No cyanosis, clubbing, edema  Skin: Warm & dry w/o tenting. No significant rash.  See summary under each active problem in the Problem List with associated updated therapeutic plan

## 2023-04-28 NOTE — Assessment & Plan Note (Addendum)
A1c was 7.2% in January indicating excellent control.  This will be updated.  Blood pressure is well-controlled on the present regimen but she has had hypokalemia with values ranging from 3.0-3.2 on 3 occasions.  The thiazide diuretic will be changed to spironolactone and BMET monitored.

## 2023-05-15 ENCOUNTER — Other Ambulatory Visit: Payer: Self-pay | Admitting: Adult Health

## 2023-05-15 MED ORDER — PREGABALIN 25 MG PO CAPS: 25.0000 mg | ORAL_CAPSULE | Freq: Every day | ORAL | 0 refills | Status: DC

## 2023-05-19 DIAGNOSIS — R488 Other symbolic dysfunctions: Secondary | ICD-10-CM | POA: Diagnosis not present

## 2023-05-19 DIAGNOSIS — F0390 Unspecified dementia without behavioral disturbance: Secondary | ICD-10-CM | POA: Diagnosis not present

## 2023-05-31 ENCOUNTER — Encounter: Payer: Self-pay | Admitting: Adult Health

## 2023-05-31 ENCOUNTER — Non-Acute Institutional Stay (SKILLED_NURSING_FACILITY): Payer: Medicare Other | Admitting: Adult Health

## 2023-05-31 DIAGNOSIS — F339 Major depressive disorder, recurrent, unspecified: Secondary | ICD-10-CM | POA: Diagnosis not present

## 2023-05-31 DIAGNOSIS — E1159 Type 2 diabetes mellitus with other circulatory complications: Secondary | ICD-10-CM

## 2023-05-31 DIAGNOSIS — F0390 Unspecified dementia without behavioral disturbance: Secondary | ICD-10-CM

## 2023-05-31 DIAGNOSIS — N183 Chronic kidney disease, stage 3 unspecified: Secondary | ICD-10-CM | POA: Diagnosis not present

## 2023-05-31 DIAGNOSIS — I152 Hypertension secondary to endocrine disorders: Secondary | ICD-10-CM

## 2023-05-31 DIAGNOSIS — E1122 Type 2 diabetes mellitus with diabetic chronic kidney disease: Secondary | ICD-10-CM

## 2023-05-31 NOTE — Progress Notes (Signed)
Location:  [facility] Room Number: 115 W Place of Service:  SNF (31)   CODE STATUS: FULL CODE  Allergies  Allergen Reactions   Gabapentin    Lisinopril    Penicillins Nausea Only    Has patient had a PCN reaction causing immediate rash, facial/tongue/throat swelling, SOB or lightheadedness with hypotension: Yes Has patient had a PCN reaction causing severe rash involving mucus membranes or skin necrosis: No Has patient had a PCN reaction that required hospitalization No Has patient had a PCN reaction occurring within the last 10 years: No If all of the above answers are "NO", then may proceed with Cephalosporin use.    Sulfa Antibiotics Nausea And Vomiting    Chief Complaint  Patient presents with   Medical Management of Chronic Issues                Hypertension associated with type 2 diabetes mellitus: Major depression recurrent chronic:  CKD stage 3 due to type 2 diabetes mellitus:  Major neurocognitive deficits     HPI:  She is a 82 year old long term resident of this facility being seen for the management of her chronic illnesses: Hypertension associated with type 2 diabetes mellitus: Major depression recurrent chronic:  CKD stage 3 due to type 2 diabetes mellitus:  Major neurocognitive deficits. She has had no further issues with pain management or pneumonia. She states that she is feeling good; denies any pain.   Past Medical History:  Diagnosis Date   Anxiety    Bowel obstruction (HCC)    Burning with urination 01/04/2016   Diabetes mellitus with neuropathy    Genital herpes    GERD (gastroesophageal reflux disease)    Hematuria 01/04/2016   Herpes 12/22/2015   Hyperlipidemia    Hypertension    LLQ abdominal tenderness 01/04/2016   Neuropathy    Pelvic pressure in female 01/04/2016   Recurrent UTI    Sciatic nerve disease    Small bowel obstruction (HCC) 04/27/2016   Urinary frequency 01/04/2016   Urticaria     Past Surgical  History:  Procedure Laterality Date   ABDOMINAL HYSTERECTOMY     APPLICATION OF INTRAOPERATIVE CT SCAN N/A 12/19/2018   Procedure: APPLICATION OF INTRAOPERATIVE CT SCAN;  Surgeon: Maeola Harman, MD;  Location: [facility] OR;  Service: Neurosurgery;  Laterality: N/A;   bowel obstruction     COLON SURGERY     blocked colon   POSTERIOR CERVICAL FUSION/FORAMINOTOMY N/A 12/19/2018   Procedure: Cervical Seven to Thoracic One Posterior cervical fusion;  Surgeon: Maeola Harman, MD;  Location: [facility] OR;  Service: Neurosurgery;  Laterality: N/A;  Cervical Seven to Thoracic One Posterior cervical fusion    Social History   Socioeconomic History   Marital status: Widowed    Spouse name: Not on file   Number of children: Not on file   Years of education: Not on file   Highest education level: Not on file  Occupational History   Occupation: retired   Tobacco Use   Smoking status: Never   Smokeless tobacco: Current    Types: Snuff  Vaping Use   Vaping Use: Never used  Substance and Sexual Activity   Alcohol use: No   Drug use: No   Sexual activity: Not Currently    Birth control/protection: Surgical    Comment: hyst  Other Topics Concern   Not on file  Social History Narrative   Long term resident of [facility]    Social Determinants of  Health   Financial Resource Strain: Not on file  Food Insecurity: Not on file  Transportation Needs: Not on file  Physical Activity: Not on file  Stress: Not on file  Social Connections: Not on file  Intimate Partner Violence: Not on file   Family History  Problem Relation Age of Onset   Diabetes Mother    Heart disease Brother    Hypertension Brother    Diabetes Brother    Hypertension Daughter    Diabetes Brother    Diabetes Brother    Alzheimer's disease Brother    Anxiety disorder Daughter       VITAL SIGNS BP (!) 163/69   Pulse 73   Temp 98.1 F (36.7 C)   Resp 18   Ht 5' (1.524 m)   Wt 178 lb 12.8 oz (81.1 kg)   SpO2 93%   BMI 34.92 kg/m    Outpatient Encounter Medications as of 05/31/2023  Medication Sig   acetaminophen (TYLENOL) 650 MG CR tablet Take 650 mg by mouth 3 (three) times daily.   amLODipine (NORVASC) 10 MG tablet Take 1 tablet (10 mg total) by mouth daily.   aspirin 81 MG chewable tablet Chew 81 mg by mouth daily.   busPIRone (BUSPAR) 5 MG tablet Take 5 mg by mouth 3 (three) times daily.   carvedilol (COREG) 3.125 MG tablet Take 3.125 mg by mouth 2 (two) times daily with a meal.   Cranberry 500 MG CAPS Take by mouth in the morning and at bedtime. For UTI prevention   DULoxetine (CYMBALTA) 60 MG capsule Take 60 mg by mouth daily. Pain management   Insulin Glargine (BASAGLAR KWIKPEN) 100 UNIT/ML Inject 10 Units into the skin at bedtime.   Insulin Pen Needle (BD AUTOSHIELD DUO) 30G X 5 MM MISC by Does not apply route. 3/16"   lamoTRIgine (LAMICTAL) 25 MG tablet Take 25 mg by mouth daily.   linagliptin (TRADJENTA) 5 MG TABS tablet Take 5 mg by mouth daily.   loratadine (CLARITIN) 10 MG tablet Take 10 mg by mouth daily.   Melatonin 5 MG TABS Take 5 mg by mouth at bedtime.   meloxicam (MOBIC) 15 MG tablet Take 15 mg by mouth daily. Hip Pain   metFORMIN (GLUCOPHAGE) 1000 MG tablet Take 1,000 mg by mouth 2 (two) times daily with a meal.    mirabegron ER (MYRBETRIQ) 50 MG TB24 tablet Take 50 mg by mouth daily.   NON FORMULARY Diet: Regular diet   omeprazole (PRILOSEC) 20 MG capsule Take 20 mg by mouth daily. Special Instructions:  *TAKE ON AN EMPTY STOMACH* *DO NOT CRUSH*   polyethylene glycol (MIRALAX / GLYCOLAX) packet Take 17 g by mouth daily.   pregabalin (LYRICA) 25 MG capsule Take 1 capsule (25 mg total) by mouth at bedtime.   senna-docusate (SENOKOT-S) 8.6-50 MG tablet Take 1 tablet by mouth at bedtime.    sertraline (ZOLOFT) 25 MG tablet Take 25 mg by mouth daily.   simvastatin (ZOCOR) 10 MG tablet Take 10 mg by mouth at bedtime.    triamcinolone (NASACORT) 55 MCG/ACT AERO nasal inhaler Place 1 spray into the  nose daily.   valACYclovir (VALTREX) 500 MG tablet Take 500 mg by mouth daily.    [DISCONTINUED] ceFEPIme 1 g in sodium chloride 0.9 % 100 mL Inject 1 g into the vein every 8 (eight) hours.   [DISCONTINUED] chlorthalidone (HYGROTON) 25 MG tablet Take 12.5 mg by mouth daily.   [DISCONTINUED] Dextromethorphan-guaiFENesin 10-100 MG/5ML liquid Take 10 mLs by  mouth at bedtime as needed.   [DISCONTINUED] Dyclonine HCl 2 MG LOZG Use as directed 1 lozenge in the mouth or throat every 4 (four) hours as needed.   [DISCONTINUED] ipratropium-albuterol (DUONEB) 0.5-2.5 (3) MG/3ML SOLN 3 mLs every 6 (six) hours.   [DISCONTINUED] Menthol, Topical Analgesic, (BIOFREEZE) 4 % GEL Apply topically. Every Shift - PRN   [DISCONTINUED] montelukast (SINGULAIR) 10 MG tablet Take 1 tablet (10 mg total) by mouth at bedtime.   [DISCONTINUED] ondansetron (ZOFRAN) 4 MG tablet Take 4 mg by mouth every 6 (six) hours as needed for nausea or vomiting.   [DISCONTINUED] PRESCRIPTION MEDICATION MICROGUARD POWDER  powder; amt: SPARINGLY; topical Special Instructions: REDNESS UNDER BREAST Once A Day   No facility-administered encounter medications on file as of 05/31/2023.     SIGNIFICANT DIAGNOSTIC EXAMS  PREVIOUS   07-27-22: dexa t score: 0.636  NO NEW EXAMS    LABS REVIEWED PREVIOUS;     06-30-22: glucose 150; bun 17; creat 0.81; k+ 3.8; na++ 138; ca 9.1 gfr >60 07-02-22: wbc 10.5; hgb 11.1; hct 35.4; mcv 82.5 plt 230; glucose 138; bun 18; creat 0.98; k+ 4.2; na++ 137; ca 8.9; gfr >60;  07-08-22: hgb a1c 7.8  09-26-22: hgb a1c: 8.0 12-29-22: wbc 10.0; hgb 10.8; hct 35.2 mcv 82.6 plt 212; glucose 131; bun 21; creat 0.95; k+ 3.6; na++ 136; ca 8.6; gfr >60; protein 6.6 albumin 3.6; hgb A1c 7.2; chol 132; ldl 50; trig 134; hdl 55 01-26-23: d-dimer: 0.46 03-02-23: glucose 133; bun 23; creat 0.83; k+ 3.5; na++ 134; ca 9.1 gfr >60 03-23-23: tsh 5.410; vitamin B 12: 806   04-05-23: wbc 19.9; hgb 11.1; hct 34.6; mcv 78.8 plt 306;  glucose 137; bun 48; creat 1.11; k+ 3.0; na++ 133; ca 9.0; gfr 50  04-06-23; wbc 19.8; hgb 11.1; hct 35.3; mcv 81.0 plt 312; glucose 95; bun 40; creat 1.21; k+ 4.0; na++ 137; ca 9.2; gfr 45  04-10-23: wbc 14.6; hgb 10.7; hct 34.2; mcv 79.4 plt 236; glucose 106; bun 21; creat 0.84; k+ 3.2; na++ 132; ca 8.7 gfr >60  TODAY  04-13-23: wbc 10.6; hgb 10.0; hct 32.3; mcv 82.2 plt 219; glucose 96; bun 22; creat 0.92; k+ 3.0; na++ 136; ca 8.5; gfr >60 04-17-23: k+ 4.1   Review of Systems  Constitutional:  Negative for malaise/fatigue.  Respiratory:  Negative for cough and shortness of breath.   Cardiovascular:  Negative for chest pain, palpitations and leg swelling.  Gastrointestinal:  Negative for abdominal pain, constipation and heartburn.  Musculoskeletal:  Negative for back pain, joint pain and myalgias.  Skin: Negative.   Neurological:  Negative for dizziness.  Psychiatric/Behavioral:  The patient is not nervous/anxious.    Physical Exam Constitutional:      General: She is not in acute distress.    Appearance: She is well-developed. She is not diaphoretic.  Neck:     Thyroid: No thyromegaly.  Cardiovascular:     Rate and Rhythm: Normal rate and regular rhythm.     Pulses: Normal pulses.     Heart sounds: Normal heart sounds.  Pulmonary:     Effort: Pulmonary effort is normal. No respiratory distress.     Breath sounds: Normal breath sounds.  Abdominal:     General: Bowel sounds are normal. There is no distension.     Palpations: Abdomen is soft.     Tenderness: There is no abdominal tenderness.  Musculoskeletal:        General: Normal range of motion.  Cervical back: Neck supple.     Right lower leg: No edema.     Left lower leg: No edema.  Lymphadenopathy:     Cervical: No cervical adenopathy.  Skin:    General: Skin is warm and dry.  Neurological:     Mental Status: She is alert. Mental status is at baseline.     Comments: 04-12-23: SLUMS 6/30   Psychiatric:        Mood and  Affect: Mood normal.      ASSESSMENT/ PLAN:   TODAY  Hypertension associated with type 2 diabetes mellitus: b/p 163/69 is not well controlled: will continue norvasc 10 mg daily coreg 3.125 mg twice daily and will begin cozaar 25 mg daily will repeat labs    2. Major depression recurrent chronic: will continue cymbalta 60 mg daily (failed one wean; also takes for pain management) lamictal 25 mg daily for mood; zoloft 25 mg daily and buspar 5 mg three times daily for anxiety.   3 CKD stage 3 due to type 2 diabetes mellitus: bun 22; creat 0.92; gfr >60   4. Major neurocognitive deficits: SLUMS 6/30.    PREVIOUS    5. Normocytic anemia: hgb 10.0 hct 32.3  6. Dyslipidemia associated with type 2 diabetes mellitus LDL is 55 will continue zocor 10 mg daily   7 aortic atherosclerosis (ct 12-18-19) is on asa and statin  8. Type 2 diabetes mellitus with peripheral angiopathy: hgb A1c 7.2; will continue metformin 1 gm twice daily tradjenta 5 mg daily semglee 10 units nightly is on asa and statin;  9. Gastroesophageal reflux disease without esophagitis: will continue prilosec 20 mg daily   10. OAB: will continue myrbetriq 50 mg daily   11. Chronic generalized pain/left sciatic pain: will continue tylenol 650 mg three times daily mobic 15 mg daily  lyrica 25 mg nightly and cymbalta 60 mg daily   12. Chronic non-seasonal allergic rhinitis: will continue claritin 10 mg daily; nasocort daily   13. Herpes: no recent outbreaks: will continue valtrex 500 mg daily  14. Vitamin B 12 deficiency: level is 1068; will monitor  15. Chronic constipation: will continue miralax daily and senna s daily     Synthia Innocent NP [facility]   call [phone]

## 2023-06-01 ENCOUNTER — Other Ambulatory Visit (HOSPITAL_COMMUNITY)
Admission: RE | Admit: 2023-06-01 | Discharge: 2023-06-01 | Disposition: A | Discharge status: Home / Self Care | Payer: Medicare Other | Source: Skilled Nursing Facility | Attending: Internal Medicine | Admitting: Internal Medicine

## 2023-06-01 DIAGNOSIS — E1142 Type 2 diabetes mellitus with diabetic polyneuropathy: Secondary | ICD-10-CM | POA: Diagnosis not present

## 2023-06-02 LAB — MICROALBUMIN / CREATININE URINE RATIO
Creatinine, Urine: 28.5 mg/dL
Microalb Creat Ratio: 27 mg/g creat (ref 0–29)
Microalb, Ur: 7.8 ug/mL — ABNORMAL HIGH

## 2023-06-13 ENCOUNTER — Other Ambulatory Visit: Payer: Self-pay | Admitting: Adult Health

## 2023-06-13 MED ORDER — PREGABALIN 25 MG PO CAPS: 25.0000 mg | ORAL_CAPSULE | Freq: Every day | ORAL | 0 refills | Status: DC

## 2023-06-22 ENCOUNTER — Other Ambulatory Visit (HOSPITAL_COMMUNITY)
Admission: RE | Admit: 2023-06-22 | Discharge: 2023-06-22 | Disposition: A | Discharge status: Home / Self Care | Payer: Medicare Other | Source: Ambulatory Visit | Attending: Adult Health | Admitting: Adult Health

## 2023-06-22 DIAGNOSIS — E1169 Type 2 diabetes mellitus with other specified complication: Secondary | ICD-10-CM | POA: Diagnosis not present

## 2023-06-22 LAB — BASIC METABOLIC PANEL
Anion gap: 9 (ref 5–15)
BUN: 28 mg/dL — ABNORMAL HIGH (ref 8–23)
CO2: 25 mmol/L (ref 22–32)
Calcium: 8.8 mg/dL — ABNORMAL LOW (ref 8.9–10.3)
Chloride: 96 mmol/L — ABNORMAL LOW (ref 98–111)
Creatinine, Ser: 0.97 mg/dL (ref 0.44–1.00)
GFR, Estimated: 59 mL/min — ABNORMAL LOW (ref >60)
Glucose, Bld: 110 mg/dL — ABNORMAL HIGH (ref 70–99)
Potassium: 4.1 mmol/L (ref 3.5–5.1)
Sodium: 130 mmol/L — ABNORMAL LOW (ref 135–145)

## 2023-06-23 LAB — HEMOGLOBIN A1C
Hgb A1c MFr Bld: 6.8 % — ABNORMAL HIGH (ref 4.8–5.6)
Mean Plasma Glucose: 148 mg/dL

## 2023-06-27 ENCOUNTER — Non-Acute Institutional Stay (SKILLED_NURSING_FACILITY): Payer: Medicare Other | Admitting: Adult Health

## 2023-06-27 ENCOUNTER — Encounter: Payer: Self-pay | Admitting: Adult Health

## 2023-06-27 DIAGNOSIS — D649 Anemia, unspecified: Secondary | ICD-10-CM | POA: Diagnosis not present

## 2023-06-27 DIAGNOSIS — E1169 Type 2 diabetes mellitus with other specified complication: Secondary | ICD-10-CM | POA: Diagnosis not present

## 2023-06-27 DIAGNOSIS — E785 Hyperlipidemia, unspecified: Secondary | ICD-10-CM | POA: Diagnosis not present

## 2023-06-27 DIAGNOSIS — I700 Atherosclerosis of aorta: Secondary | ICD-10-CM | POA: Diagnosis not present

## 2023-06-27 NOTE — Progress Notes (Signed)
Location:  [facility] Room Number: 115 Place of Service:  SNF (31)   CODE STATUS: full   Allergies  Allergen Reactions   Gabapentin    Lisinopril    Penicillins Nausea Only    Has patient had a PCN reaction causing immediate rash, facial/tongue/throat swelling, SOB or lightheadedness with hypotension: Yes Has patient had a PCN reaction causing severe rash involving mucus membranes or skin necrosis: No Has patient had a PCN reaction that required hospitalization No Has patient had a PCN reaction occurring within the last 10 years: No If all of the above answers are "NO", then may proceed with Cephalosporin use.    Sulfa Antibiotics Nausea And Vomiting    Chief Complaint  Patient presents with   Medical Management of Chronic Issues           Normocytic anemia:    Dyslipidemia associated with type 2 diabetes mellitus:  Aortic atherosclerosis     HPI:  She is a 82 year old long term resident of this facility being seen for the management of her chronic illnesses: Normocytic anemia:    Dyslipidemia associated with type 2 diabetes mellitus:  Aortic atherosclerosis.  There are no reports of uncontrolled pain. Her weight is stable.   Past Medical History:  Diagnosis Date   Anxiety    Bowel obstruction (HCC)    Burning with urination 01/04/2016   Diabetes mellitus with neuropathy    Genital herpes    GERD (gastroesophageal reflux disease)    Hematuria 01/04/2016   Herpes 12/22/2015   Hyperlipidemia    Hypertension    LLQ abdominal tenderness 01/04/2016   Neuropathy    Pelvic pressure in female 01/04/2016   Recurrent UTI    Sciatic nerve disease    Small bowel obstruction (HCC) 04/27/2016   Urinary frequency 01/04/2016   Urticaria     Past Surgical History:  Procedure Laterality Date   ABDOMINAL HYSTERECTOMY     APPLICATION OF INTRAOPERATIVE CT SCAN N/A 12/19/2018   Procedure: APPLICATION OF INTRAOPERATIVE CT SCAN;  Surgeon: Maeola Harman, MD;   Location: [facility] OR;  Service: Neurosurgery;  Laterality: N/A;   bowel obstruction     COLON SURGERY     blocked colon   POSTERIOR CERVICAL FUSION/FORAMINOTOMY N/A 12/19/2018   Procedure: Cervical Seven to Thoracic One Posterior cervical fusion;  Surgeon: Maeola Harman, MD;  Location: [facility] OR;  Service: Neurosurgery;  Laterality: N/A;  Cervical Seven to Thoracic One Posterior cervical fusion    Social History   Socioeconomic History   Marital status: Widowed    Spouse name: Not on file   Number of children: Not on file   Years of education: Not on file   Highest education level: Not on file  Occupational History   Occupation: retired   Tobacco Use   Smoking status: Never   Smokeless tobacco: Current    Types: Snuff  Vaping Use   Vaping status: Never Used  Substance and Sexual Activity   Alcohol use: No   Drug use: No   Sexual activity: Not Currently    Birth control/protection: Surgical    Comment: hyst  Other Topics Concern   Not on file  Social History Narrative   Long term resident of [facility]    Social Determinants of Health   Financial Resource Strain: Not on file  Food Insecurity: Not on file  Transportation Needs: Not on file  Physical Activity: Not on file  Stress: Not on file  Social  Connections: Not on file  Intimate Partner Violence: Not on file   Family History  Problem Relation Age of Onset   Diabetes Mother    Heart disease Brother    Hypertension Brother    Diabetes Brother    Hypertension Daughter    Diabetes Brother    Diabetes Brother    Alzheimer's disease Brother    Anxiety disorder Daughter       VITAL SIGNS BP 138/78   Pulse 78   Temp 98.6 F (37 C)   Resp (!) 22   Ht 5' (1.524 m)   Wt 181 lb 9.6 oz (82.4 kg)   SpO2 98%   BMI 35.47 kg/m   Outpatient Encounter Medications as of 06/27/2023  Medication Sig   acetaminophen (TYLENOL) 650 MG CR tablet Take 650 mg by mouth 3 (three) times daily.   amLODipine (NORVASC) 10 MG tablet Take 1  tablet (10 mg total) by mouth daily.   aspirin 81 MG chewable tablet Chew 81 mg by mouth daily.   busPIRone (BUSPAR) 5 MG tablet Take 5 mg by mouth 3 (three) times daily.   carvedilol (COREG) 3.125 MG tablet Take 3.125 mg by mouth 2 (two) times daily with a meal.   Cranberry 500 MG CAPS Take by mouth in the morning and at bedtime. For UTI prevention   DULoxetine (CYMBALTA) 60 MG capsule Take 60 mg by mouth daily. Pain management   Insulin Glargine (BASAGLAR KWIKPEN) 100 UNIT/ML Inject 10 Units into the skin at bedtime.   Insulin Pen Needle (BD AUTOSHIELD DUO) 30G X 5 MM MISC by Does not apply route. 3/16"   lamoTRIgine (LAMICTAL) 25 MG tablet Take 25 mg by mouth daily.   linagliptin (TRADJENTA) 5 MG TABS tablet Take 5 mg by mouth daily.   loratadine (CLARITIN) 10 MG tablet Take 10 mg by mouth daily.   Melatonin 5 MG TABS Take 5 mg by mouth at bedtime.   meloxicam (MOBIC) 15 MG tablet Take 15 mg by mouth daily. Hip Pain   metFORMIN (GLUCOPHAGE) 1000 MG tablet Take 1,000 mg by mouth 2 (two) times daily with a meal.    mirabegron ER (MYRBETRIQ) 50 MG TB24 tablet Take 50 mg by mouth daily.   NON FORMULARY Diet: Regular diet   omeprazole (PRILOSEC) 20 MG capsule Take 20 mg by mouth daily. Special Instructions:  *TAKE ON AN EMPTY STOMACH* *DO NOT CRUSH*   polyethylene glycol (MIRALAX / GLYCOLAX) packet Take 17 g by mouth daily.   pregabalin (LYRICA) 25 MG capsule Take 1 capsule (25 mg total) by mouth at bedtime.   senna-docusate (SENOKOT-S) 8.6-50 MG tablet Take 1 tablet by mouth at bedtime.    sertraline (ZOLOFT) 25 MG tablet Take 25 mg by mouth daily.   simvastatin (ZOCOR) 10 MG tablet Take 10 mg by mouth at bedtime.    triamcinolone (NASACORT) 55 MCG/ACT AERO nasal inhaler Place 1 spray into the nose daily.   valACYclovir (VALTREX) 500 MG tablet Take 500 mg by mouth daily.    No facility-administered encounter medications on file as of 06/27/2023.     SIGNIFICANT DIAGNOSTIC  EXAMS   PREVIOUS   07-27-22: dexa t score: 0.636  NO NEW EXAMS    LABS REVIEWED PREVIOUS;     06-30-22: glucose 150; bun 17; creat 0.81; k+ 3.8; na++ 138; ca 9.1 gfr >60 07-02-22: wbc 10.5; hgb 11.1; hct 35.4; mcv 82.5 plt 230; glucose 138; bun 18; creat 0.98; k+ 4.2; na++ 137; ca 8.9; gfr >60;  07-08-22: hgb a1c 7.8  09-26-22: hgb a1c: 8.0 12-29-22: wbc 10.0; hgb 10.8; hct 35.2 mcv 82.6 plt 212; glucose 131; bun 21; creat 0.95; k+ 3.6; na++ 136; ca 8.6; gfr >60; protein 6.6 albumin 3.6; hgb A1c 7.2; chol 132; ldl 50; trig 134; hdl 55 01-26-23: d-dimer: 0.46 03-02-23: glucose 133; bun 23; creat 0.83; k+ 3.5; na++ 134; ca 9.1 gfr >60 03-23-23: tsh 5.410; vitamin B 12: 806   04-05-23: wbc 19.9; hgb 11.1; hct 34.6; mcv 78.8 plt 306; glucose 137; bun 48; creat 1.11; k+ 3.0; na++ 133; ca 9.0; gfr 50  04-06-23; wbc 19.8; hgb 11.1; hct 35.3; mcv 81.0 plt 312; glucose 95; bun 40; creat 1.21; k+ 4.0; na++ 137; ca 9.2; gfr 45  04-10-23: wbc 14.6; hgb 10.7; hct 34.2; mcv 79.4 plt 236; glucose 106; bun 21; creat 0.84; k+ 3.2; na++ 132; ca 8.7 gfr >60 04-13-23: wbc 10.6; hgb 10.0; hct 32.3; mcv 82.2 plt 219; glucose 96; bun 22; creat 0.92; k+ 3.0; na++ 136; ca 8.5; gfr >60 04-17-23: k+ 4.1  TODAY  06-01-23: urine microalbumin 7.8 06-22-23: glucose 110; bun 28; creat 0.97; k+ 4.1; na++ 130; ca 8.8; gfr 59     Review of Systems  Constitutional:  Negative for malaise/fatigue.  Respiratory:  Negative for cough and shortness of breath.   Cardiovascular:  Negative for chest pain, palpitations and leg swelling.  Gastrointestinal:  Negative for abdominal pain, constipation and heartburn.  Musculoskeletal:  Negative for back pain, joint pain and myalgias.  Skin: Negative.   Neurological:  Negative for dizziness.  Psychiatric/Behavioral:  The patient is not nervous/anxious.    Physical Exam Constitutional:      General: She is not in acute distress.    Appearance: She is well-developed. She is not diaphoretic.   Neck:     Thyroid: No thyromegaly.  Cardiovascular:     Rate and Rhythm: Normal rate and regular rhythm.     Heart sounds: Normal heart sounds.  Pulmonary:     Effort: Pulmonary effort is normal. No respiratory distress.     Breath sounds: Normal breath sounds.  Abdominal:     General: Bowel sounds are normal. There is no distension.     Palpations: Abdomen is soft.     Tenderness: There is no abdominal tenderness.  Musculoskeletal:        General: Normal range of motion.     Cervical back: Neck supple.     Right lower leg: No edema.     Left lower leg: No edema.  Lymphadenopathy:     Cervical: No cervical adenopathy.  Skin:    General: Skin is warm and dry.  Neurological:     Mental Status: She is alert. Mental status is at baseline.     Comments: 04-12-23: SLUMS 6/30    Psychiatric:        Mood and Affect: Mood normal.     ASSESSMENT/ PLAN:  TODAY  Normocytic anemia: hgb 10.0; hct 32.3  2. Dyslipidemia associated with type 2 diabetes mellitus: LDL 55 will continue zocor 10 mg daily   3. Aortic atherosclerosis (ct 12-18-19) is on asa and statin   PREVIOUS    4. 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 Gastroesophageal reflux disease without esophagitis: will continue prilosec 20 mg daily   6. OAB: will continue myrbetriq 50 mg daily  7. Chronic generalized pain/left sciatic pain: will continue tylenol 650 mg three times daily mobic 15 mg daily  lyrica 25 mg nightly and cymbalta 60 mg daily   8. Chronic non-seasonal allergic rhinitis: will continue claritin 10 mg daily; nasocort daily   9. Herpes: no recent outbreaks: will continue valtrex 500 mg daily  10. Vitamin B 12 deficiency: level is 1068; will monitor  11. Chronic constipation: will continue miralax daily and senna s daily  12. Hypertension associated with type 2  diabetes mellitus: b/p 138/78 : will continue norvasc 10 mg daily coreg 3.125 mg twice daily  cozaar 25 mg daily     13. Major depression recurrent chronic: will continue cymbalta 60 mg daily (failed one wean; also takes for pain management) lamictal 25 mg daily for mood; zoloft 25 mg daily and buspar 5 mg three times daily for anxiety.   14.  CKD stage 3 due to type 2 diabetes mellitus: bun 28; creat 0.97; gfr 59   15. Major neurocognitive deficits: SLUMS 6/30.         Synthia Innocent NP [facility]  call [phone]

## 2023-06-28 ENCOUNTER — Other Ambulatory Visit: Payer: Self-pay | Admitting: Adult Health

## 2023-06-28 ENCOUNTER — Encounter: Payer: Self-pay | Admitting: Adult Health

## 2023-06-28 ENCOUNTER — Non-Acute Institutional Stay (SKILLED_NURSING_FACILITY): Payer: Medicare Other | Admitting: Adult Health

## 2023-06-28 DIAGNOSIS — M5416 Radiculopathy, lumbar region: Secondary | ICD-10-CM

## 2023-06-28 DIAGNOSIS — M5432 Sciatica, left side: Secondary | ICD-10-CM

## 2023-06-28 MED ORDER — PREGABALIN 25 MG PO CAPS: 25.0000 mg | ORAL_CAPSULE | Freq: Two times a day (BID) | ORAL | 0 refills | Status: DC

## 2023-06-28 MED ORDER — SERTRALINE HCL 25 MG PO TABS: 25.0000 mg | ORAL_TABLET | Freq: Two times a day (BID) | ORAL | 0 refills | Status: DC

## 2023-06-28 NOTE — Progress Notes (Signed)
Location:  [facility] Room Number: 115 Place of Service:  SNF (31)   CODE STATUS: full   Allergies  Allergen Reactions   Gabapentin    Lisinopril    Penicillins Nausea Only    Has patient had a PCN reaction causing immediate rash, facial/tongue/throat swelling, SOB or lightheadedness with hypotension: Yes Has patient had a PCN reaction causing severe rash involving mucus membranes or skin necrosis: No Has patient had a PCN reaction that required hospitalization No Has patient had a PCN reaction occurring within the last 10 years: No If all of the above answers are "NO", then may proceed with Cephalosporin use.    Sulfa Antibiotics Nausea And Vomiting    Chief Complaint  Patient presents with   Acute Visit    Left  leg pain     HPI:  She is having significant left lower extremity pain. The pain radiates down her leg and is severe. She is not getting relief with her current regimen. She is able to ambulate with a walker. The pain is both achy; sharp with pins and needles present. She has had this in the past and has responded to a short burst of prednisone has been effective.   Past Medical History:  Diagnosis Date   Anxiety    Bowel obstruction (HCC)    Burning with urination 01/04/2016   Diabetes mellitus with neuropathy    Genital herpes    GERD (gastroesophageal reflux disease)    Hematuria 01/04/2016   Herpes 12/22/2015   Hyperlipidemia    Hypertension    LLQ abdominal tenderness 01/04/2016   Neuropathy    Pelvic pressure in female 01/04/2016   Recurrent UTI    Sciatic nerve disease    Small bowel obstruction (HCC) 04/27/2016   Urinary frequency 01/04/2016   Urticaria     Past Surgical History:  Procedure Laterality Date   ABDOMINAL HYSTERECTOMY     APPLICATION OF INTRAOPERATIVE CT SCAN N/A 12/19/2018   Procedure: APPLICATION OF INTRAOPERATIVE CT SCAN;  Surgeon: Maeola Harman, MD;  Location: [facility] OR;  Service: Neurosurgery;   Laterality: N/A;   bowel obstruction     COLON SURGERY     blocked colon   POSTERIOR CERVICAL FUSION/FORAMINOTOMY N/A 12/19/2018   Procedure: Cervical Seven to Thoracic One Posterior cervical fusion;  Surgeon: Maeola Harman, MD;  Location: [facility] OR;  Service: Neurosurgery;  Laterality: N/A;  Cervical Seven to Thoracic One Posterior cervical fusion    Social History   Socioeconomic History   Marital status: Widowed    Spouse name: Not on file   Number of children: Not on file   Years of education: Not on file   Highest education level: Not on file  Occupational History   Occupation: retired   Tobacco Use   Smoking status: Never   Smokeless tobacco: Current    Types: Snuff  Vaping Use   Vaping status: Never Used  Substance and Sexual Activity   Alcohol use: No   Drug use: No   Sexual activity: Not Currently    Birth control/protection: Surgical    Comment: hyst  Other Topics Concern   Not on file  Social History Narrative   Long term resident of [facility]    Social Determinants of Health   Financial Resource Strain: Not on file  Food Insecurity: Not on file  Transportation Needs: Not on file  Physical Activity: Not on file  Stress: Not on file  Social Connections: Not on file  Intimate Partner Violence: Not on file   Family History  Problem Relation Age of Onset   Diabetes Mother    Heart disease Brother    Hypertension Brother    Diabetes Brother    Hypertension Daughter    Diabetes Brother    Diabetes Brother    Alzheimer's disease Brother    Anxiety disorder Daughter       VITAL SIGNS BP (!) 140/85   Pulse 78   Temp 98.6 F (37 C)   Resp 20   Ht 5' (1.524 m)   Wt 181 lb 9.6 oz (82.4 kg)   SpO2 98%   BMI 35.47 kg/m   Outpatient Encounter Medications as of 06/28/2023  Medication Sig   acetaminophen (TYLENOL) 650 MG CR tablet Take 650 mg by mouth 3 (three) times daily.   amLODipine (NORVASC) 10 MG tablet Take 1 tablet (10 mg total) by mouth daily.    aspirin 81 MG chewable tablet Chew 81 mg by mouth daily.   busPIRone (BUSPAR) 5 MG tablet Take 5 mg by mouth 3 (three) times daily.   carvedilol (COREG) 3.125 MG tablet Take 3.125 mg by mouth 2 (two) times daily with a meal.   Cranberry 500 MG CAPS Take by mouth in the morning and at bedtime. For UTI prevention   DULoxetine (CYMBALTA) 60 MG capsule Take 60 mg by mouth daily. Pain management   Insulin Glargine (BASAGLAR KWIKPEN) 100 UNIT/ML Inject 10 Units into the skin at bedtime.   Insulin Pen Needle (BD AUTOSHIELD DUO) 30G X 5 MM MISC by Does not apply route. 3/16"   lamoTRIgine (LAMICTAL) 25 MG tablet Take 25 mg by mouth daily.   linagliptin (TRADJENTA) 5 MG TABS tablet Take 5 mg by mouth daily.   loratadine (CLARITIN) 10 MG tablet Take 10 mg by mouth daily.   losartan (COZAAR) 25 MG tablet Take 25 mg by mouth daily.   Melatonin 5 MG TABS Take 5 mg by mouth at bedtime.   meloxicam (MOBIC) 15 MG tablet Take 15 mg by mouth daily. Hip Pain   metFORMIN (GLUCOPHAGE) 1000 MG tablet Take 1,000 mg by mouth 2 (two) times daily with a meal.    mirabegron ER (MYRBETRIQ) 50 MG TB24 tablet Take 50 mg by mouth daily.   NON FORMULARY Diet: Regular diet   omeprazole (PRILOSEC) 20 MG capsule Take 20 mg by mouth daily. Special Instructions:  *TAKE ON AN EMPTY STOMACH* *DO NOT CRUSH*   polyethylene glycol (MIRALAX / GLYCOLAX) packet Take 17 g by mouth daily.   pregabalin (LYRICA) 25 MG capsule Take 1 capsule (25 mg total) by mouth at bedtime.   senna-docusate (SENOKOT-S) 8.6-50 MG tablet Take 1 tablet by mouth at bedtime.    sertraline (ZOLOFT) 25 MG tablet Take 25 mg by mouth daily.   simvastatin (ZOCOR) 10 MG tablet Take 10 mg by mouth at bedtime.    triamcinolone (NASACORT) 55 MCG/ACT AERO nasal inhaler Place 1 spray into the nose daily.   valACYclovir (VALTREX) 500 MG tablet Take 500 mg by mouth daily.    No facility-administered encounter medications on file as of 06/28/2023.     SIGNIFICANT  DIAGNOSTIC EXAMS  PREVIOUS   07-27-22: dexa t score: 0.636  NO NEW EXAMS    LABS REVIEWED PREVIOUS;     06-30-22: glucose 150; bun 17; creat 0.81; k+ 3.8; na++ 138; ca 9.1 gfr >60 07-02-22: wbc 10.5; hgb 11.1; hct 35.4; mcv 82.5 plt 230; glucose 138; bun 18; creat 0.98; k+  4.2; na++ 137; ca 8.9; gfr >60;  07-08-22: hgb a1c 7.8  09-26-22: hgb a1c: 8.0 12-29-22: wbc 10.0; hgb 10.8; hct 35.2 mcv 82.6 plt 212; glucose 131; bun 21; creat 0.95; k+ 3.6; na++ 136; ca 8.6; gfr >60; protein 6.6 albumin 3.6; hgb A1c 7.2; chol 132; ldl 50; trig 134; hdl 55 01-26-23: d-dimer: 0.46 03-02-23: glucose 133; bun 23; creat 0.83; k+ 3.5; na++ 134; ca 9.1 gfr >60 03-23-23: tsh 5.410; vitamin B 12: 806   04-05-23: wbc 19.9; hgb 11.1; hct 34.6; mcv 78.8 plt 306; glucose 137; bun 48; creat 1.11; k+ 3.0; na++ 133; ca 9.0; gfr 50  04-06-23; wbc 19.8; hgb 11.1; hct 35.3; mcv 81.0 plt 312; glucose 95; bun 40; creat 1.21; k+ 4.0; na++ 137; ca 9.2; gfr 45  04-10-23: wbc 14.6; hgb 10.7; hct 34.2; mcv 79.4 plt 236; glucose 106; bun 21; creat 0.84; k+ 3.2; na++ 132; ca 8.7 gfr >60 04-13-23: wbc 10.6; hgb 10.0; hct 32.3; mcv 82.2 plt 219; glucose 96; bun 22; creat 0.92; k+ 3.0; na++ 136; ca 8.5; gfr >60 04-17-23: k+ 4.1 06-01-23: urine microalbumin 7.8 06-22-23: glucose 110; bun 28; creat 0.97; k+ 4.1; na++ 130; ca 8.8; gfr 59  hgb A1c 6.8  NO NEW LABS.    Review of Systems  Constitutional:  Negative for malaise/fatigue.  Respiratory:  Negative for cough and shortness of breath.   Cardiovascular:  Negative for chest pain, palpitations and leg swelling.  Gastrointestinal:  Negative for abdominal pain, constipation and heartburn.  Musculoskeletal:  Negative for back pain, joint pain and myalgias.       Has significant right leg pain   Skin: Negative.   Neurological:  Negative for dizziness.  Psychiatric/Behavioral:  The patient is not nervous/anxious.    Physical Exam Constitutional:      General: She is not in acute  distress.    Appearance: She is well-developed. She is obese. She is not diaphoretic.  Neck:     Thyroid: No thyromegaly.  Cardiovascular:     Rate and Rhythm: Normal rate and regular rhythm.     Pulses: Normal pulses.     Heart sounds: Normal heart sounds.  Pulmonary:     Effort: Pulmonary effort is normal. No respiratory distress.     Breath sounds: Normal breath sounds.  Abdominal:     General: Bowel sounds are normal. There is no distension.     Palpations: Abdomen is soft.     Tenderness: There is no abdominal tenderness.  Musculoskeletal:        General: Normal range of motion.     Cervical back: Neck supple.     Right lower leg: No edema.     Left lower leg: No edema.     Comments: Is ambulatory with a walker   Lymphadenopathy:     Cervical: No cervical adenopathy.  Skin:    General: Skin is warm and dry.  Neurological:     Mental Status: She is alert. Mental status is at baseline.     Comments: 04-12-23: SLUMS 6/30     Psychiatric:        Mood and Affect: Mood normal.     ASSESSMENT/ PLAN:  TODAY  Lumbar radiculopathy Sciatic pain left  Will increase lyrica to 25 mg twice daily and will begin prednisone 40 mg daily through 07-02-23.   Synthia Innocent NP [facility]  call [phone]

## 2023-07-03 ENCOUNTER — Encounter: Payer: Self-pay | Admitting: Adult Health

## 2023-07-03 ENCOUNTER — Non-Acute Institutional Stay (SKILLED_NURSING_FACILITY): Payer: Medicare Other | Admitting: Adult Health

## 2023-07-03 DIAGNOSIS — M7122 Synovial cyst of popliteal space [Baker], left knee: Secondary | ICD-10-CM

## 2023-07-03 NOTE — Progress Notes (Signed)
Location:  [facility] Room Number: 115 Place of Service:  SNF (31)   CODE STATUS: full   Allergies  Allergen Reactions   Gabapentin    Lisinopril    Penicillins Nausea Only    Has patient had a PCN reaction causing immediate rash, facial/tongue/throat swelling, SOB or lightheadedness with hypotension: Yes Has patient had a PCN reaction causing severe rash involving mucus membranes or skin necrosis: No Has patient had a PCN reaction that required hospitalization No Has patient had a PCN reaction occurring within the last 10 years: No If all of the above answers are "NO", then may proceed with Cephalosporin use.    Sulfa Antibiotics Nausea And Vomiting    Chief Complaint  Patient presents with   Acute Visit    Left knee pain     HPI:  She has been left sciatic pain and was started on prednisone burst to help manage her pain. She is now complaining of pain on her left knee posterior to her foot. She states that the pain is severe; sharp. She is complaining of "swelling" behind her knee. She is able to ambulate with a walker.   Past Medical History:  Diagnosis Date   Anxiety    Bowel obstruction (HCC)    Burning with urination 01/04/2016   Diabetes mellitus with neuropathy    Genital herpes    GERD (gastroesophageal reflux disease)    Hematuria 01/04/2016   Herpes 12/22/2015   Hyperlipidemia    Hypertension    LLQ abdominal tenderness 01/04/2016   Neuropathy    Pelvic pressure in female 01/04/2016   Recurrent UTI    Sciatic nerve disease    Small bowel obstruction (HCC) 04/27/2016   Urinary frequency 01/04/2016   Urticaria     Past Surgical History:  Procedure Laterality Date   ABDOMINAL HYSTERECTOMY     APPLICATION OF INTRAOPERATIVE CT SCAN N/A 12/19/2018   Procedure: APPLICATION OF INTRAOPERATIVE CT SCAN;  Surgeon: Maeola Harman, MD;  Location: [facility] OR;  Service: Neurosurgery;  Laterality: N/A;   bowel obstruction     COLON SURGERY      blocked colon   POSTERIOR CERVICAL FUSION/FORAMINOTOMY N/A 12/19/2018   Procedure: Cervical Seven to Thoracic One Posterior cervical fusion;  Surgeon: Maeola Harman, MD;  Location: [facility] OR;  Service: Neurosurgery;  Laterality: N/A;  Cervical Seven to Thoracic One Posterior cervical fusion    Social History   Socioeconomic History   Marital status: Widowed    Spouse name: Not on file   Number of children: Not on file   Years of education: Not on file   Highest education level: Not on file  Occupational History   Occupation: retired   Tobacco Use   Smoking status: Never   Smokeless tobacco: Current    Types: Snuff  Vaping Use   Vaping status: Never Used  Substance and Sexual Activity   Alcohol use: No   Drug use: No   Sexual activity: Not Currently    Birth control/protection: Surgical    Comment: hyst  Other Topics Concern   Not on file  Social History Narrative   Long term resident of [facility]    Social Determinants of Health   Financial Resource Strain: Not on file  Food Insecurity: Not on file  Transportation Needs: Not on file  Physical Activity: Not on file  Stress: Not on file  Social Connections: Not on file  Intimate Partner Violence: Not on file   Family History  Problem Relation Age of Onset   Diabetes Mother    Heart disease Brother    Hypertension Brother    Diabetes Brother    Hypertension Daughter    Diabetes Brother    Diabetes Brother    Alzheimer's disease Brother    Anxiety disorder Daughter       VITAL SIGNS BP 128/72   Pulse 74   Temp 98.6 F (37 C)   Resp 20   Ht 5' (1.524 m)   Wt 181 lb 9.6 oz (82.4 kg)   SpO2 97%   BMI 35.47 kg/m   Outpatient Encounter Medications as of 07/03/2023  Medication Sig   acetaminophen (TYLENOL) 650 MG CR tablet Take 650 mg by mouth 3 (three) times daily.   amLODipine (NORVASC) 10 MG tablet Take 1 tablet (10 mg total) by mouth daily.   aspirin 81 MG chewable tablet Chew 81 mg by mouth daily.   busPIRone  (BUSPAR) 5 MG tablet Take 5 mg by mouth 3 (three) times daily.   carvedilol (COREG) 3.125 MG tablet Take 3.125 mg by mouth 2 (two) times daily with a meal.   Cranberry 500 MG CAPS Take by mouth in the morning and at bedtime. For UTI prevention   DULoxetine (CYMBALTA) 60 MG capsule Take 60 mg by mouth daily. Pain management   Insulin Glargine (BASAGLAR KWIKPEN) 100 UNIT/ML Inject 10 Units into the skin at bedtime.   Insulin Pen Needle (BD AUTOSHIELD DUO) 30G X 5 MM MISC by Does not apply route. 3/16"   lamoTRIgine (LAMICTAL) 25 MG tablet Take 25 mg by mouth daily.   linagliptin (TRADJENTA) 5 MG TABS tablet Take 5 mg by mouth daily.   loratadine (CLARITIN) 10 MG tablet Take 10 mg by mouth daily.   losartan (COZAAR) 25 MG tablet Take 25 mg by mouth daily.   Melatonin 5 MG TABS Take 5 mg by mouth at bedtime.   meloxicam (MOBIC) 15 MG tablet Take 15 mg by mouth daily. Hip Pain   metFORMIN (GLUCOPHAGE) 1000 MG tablet Take 1,000 mg by mouth 2 (two) times daily with a meal.    mirabegron ER (MYRBETRIQ) 50 MG TB24 tablet Take 50 mg by mouth daily.   NON FORMULARY Diet: Regular diet   omeprazole (PRILOSEC) 20 MG capsule Take 20 mg by mouth daily. Special Instructions:  *TAKE ON AN EMPTY STOMACH* *DO NOT CRUSH*   polyethylene glycol (MIRALAX / GLYCOLAX) packet Take 17 g by mouth daily.   pregabalin (LYRICA) 25 MG capsule Take 1 capsule (25 mg total) by mouth in the morning and at bedtime.   senna-docusate (SENOKOT-S) 8.6-50 MG tablet Take 1 tablet by mouth at bedtime.    sertraline (ZOLOFT) 25 MG tablet Take 1 tablet (25 mg total) by mouth in the morning and at bedtime.   simvastatin (ZOCOR) 10 MG tablet Take 10 mg by mouth at bedtime.    triamcinolone (NASACORT) 55 MCG/ACT AERO nasal inhaler Place 1 spray into the nose daily.   valACYclovir (VALTREX) 500 MG tablet Take 500 mg by mouth daily.    No facility-administered encounter medications on file as of 07/03/2023.     SIGNIFICANT DIAGNOSTIC  EXAMS  PREVIOUS   07-27-22: dexa t score: 0.636  NO NEW EXAMS    LABS REVIEWED PREVIOUS;     06-30-22: glucose 150; bun 17; creat 0.81; k+ 3.8; na++ 138; ca 9.1 gfr >60 07-02-22: wbc 10.5; hgb 11.1; hct 35.4; mcv 82.5 plt 230; glucose 138; bun 18; creat 0.98; k+  4.2; na++ 137; ca 8.9; gfr >60;  07-08-22: hgb a1c 7.8  09-26-22: hgb a1c: 8.0 12-29-22: wbc 10.0; hgb 10.8; hct 35.2 mcv 82.6 plt 212; glucose 131; bun 21; creat 0.95; k+ 3.6; na++ 136; ca 8.6; gfr >60; protein 6.6 albumin 3.6; hgb A1c 7.2; chol 132; ldl 50; trig 134; hdl 55 01-26-23: d-dimer: 0.46 03-02-23: glucose 133; bun 23; creat 0.83; k+ 3.5; na++ 134; ca 9.1 gfr >60 03-23-23: tsh 5.410; vitamin B 12: 806   04-05-23: wbc 19.9; hgb 11.1; hct 34.6; mcv 78.8 plt 306; glucose 137; bun 48; creat 1.11; k+ 3.0; na++ 133; ca 9.0; gfr 50  04-06-23; wbc 19.8; hgb 11.1; hct 35.3; mcv 81.0 plt 312; glucose 95; bun 40; creat 1.21; k+ 4.0; na++ 137; ca 9.2; gfr 45  04-10-23: wbc 14.6; hgb 10.7; hct 34.2; mcv 79.4 plt 236; glucose 106; bun 21; creat 0.84; k+ 3.2; na++ 132; ca 8.7 gfr >60 04-13-23: wbc 10.6; hgb 10.0; hct 32.3; mcv 82.2 plt 219; glucose 96; bun 22; creat 0.92; k+ 3.0; na++ 136; ca 8.5; gfr >60 04-17-23: k+ 4.1 06-01-23: urine microalbumin 7.8 06-22-23: glucose 110; bun 28; creat 0.97; k+ 4.1; na++ 130; ca 8.8; gfr 59  hgb A1c 6.8  NO NEW LABS.    Review of Systems  Constitutional:  Negative for malaise/fatigue.  Respiratory:  Negative for cough and shortness of breath.   Cardiovascular:  Negative for chest pain, palpitations and leg swelling.  Gastrointestinal:  Negative for abdominal pain, constipation and heartburn.  Musculoskeletal:  Positive for myalgias. Negative for back pain and joint pain.       Left leg pain   Skin: Negative.   Neurological:  Negative for dizziness.  Psychiatric/Behavioral:  The patient is not nervous/anxious.     Physical Exam Constitutional:      General: She is not in acute distress.     Appearance: She is well-developed. She is obese. She is not diaphoretic.  Neck:     Thyroid: No thyromegaly.  Cardiovascular:     Rate and Rhythm: Normal rate and regular rhythm.     Pulses: Normal pulses.     Heart sounds: Normal heart sounds.  Pulmonary:     Effort: Pulmonary effort is normal. No respiratory distress.     Breath sounds: Normal breath sounds.  Abdominal:     General: Bowel sounds are normal. There is no distension.     Palpations: Abdomen is soft.     Tenderness: There is no abdominal tenderness.  Musculoskeletal:     Cervical back: Neck supple.     Right lower leg: No edema.     Left lower leg: No edema.     Comments: Left knee with baker's cyst present and is tender to touch Ambulates with a walker   Lymphadenopathy:     Cervical: No cervical adenopathy.  Skin:    General: Skin is warm and dry.  Neurological:     Mental Status: She is alert. Mental status is at baseline.     Comments:  04-12-23: SLUMS 6/30      Psychiatric:        Mood and Affect: Mood normal.       ASSESSMENT/ PLAN:  TODAY  Baker cyst, left: will setup ortho consult for relief of pain.    Synthia Innocent NP [facility]  call [phone]

## 2023-07-14 ENCOUNTER — Ambulatory Visit (INDEPENDENT_AMBULATORY_CARE_PROVIDER_SITE_OTHER): Payer: Medicare Other | Admitting: Orthopedic Surgery

## 2023-07-14 ENCOUNTER — Encounter: Payer: Self-pay | Admitting: Orthopedic Surgery

## 2023-07-14 VITALS — BP 171/79 | HR 78 | Ht 60.0 in

## 2023-07-14 DIAGNOSIS — M5416 Radiculopathy, lumbar region: Secondary | ICD-10-CM | POA: Diagnosis not present

## 2023-07-14 DIAGNOSIS — M48061 Spinal stenosis, lumbar region without neurogenic claudication: Secondary | ICD-10-CM

## 2023-07-14 NOTE — Progress Notes (Signed)
Chief Complaint  Patient presents with   Leg Pain    SCIATICA AND ? BAKERS CYST   82 year old female lives at the nursing home sent to [place] to evaluate a Baker's cyst and sciatica in her left leg and chronic lower back pain  I saw her in 2017  She had an MRI in 2023 she has extensive spinal stenosis.  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  She had extensive tenderness in her lower back on both sides as well as the middle she had more pain on the left side than the right she had normal range of motion her left hip and left knee without pain she had decreased sensation in the L4 and L5 dermatomes but no weakness in the left lower extremity  I reviewed her MRI again  L4-L5: Disc bulge with superimposed left foraminal protrusion and endplate osteophytic ridging. Left greater than right facet arthropathy with ligamentum flavum infolding. Minor canal stenosis. Narrowing of the left subarticular recess. No right foraminal stenosis. Marked left foraminal stenosis.   L5-S1: Disc bulge with endplate osteophytic ridging. Facet arthropathy with ligamentum flavum infolding. Mild canal stenosis. Narrowing of the subarticular recesses. Moderate right and moderate to marked left foraminal stenosis.   IMPRESSION: Multilevel degenerative changes as detailed above are similar to the prior examination. There is no high-grade canal narrowing. Left foraminal and subarticular recess narrowing are greatest at L4-L5 and L5-S1.     Electronically Signed   By: Guadlupe Spanish M.D.   On: 06/29/2022 15:47    Encounter Diagnoses  Name Primary?   Lumbar radiculopathy Yes   Degenerative lumbar spinal stenosis     Assessment and plan  Since we do not have a spine specialist here in the office I offered her an  epidural injection series which can be done at [facility] imaging  If she does not improve then I do not have anything to offer her in terms of pain control  She has already been put on gabapentin had side effects of nausea vomiting.  She is now on Lyrica.  She is on meloxicam.  She had a steroid taper.  Again if no improvement after epidural series I think referral would be in order

## 2023-07-14 NOTE — Patient Instructions (Signed)
Call [facility] next week to schedule the injections [phone]

## 2023-07-17 ENCOUNTER — Other Ambulatory Visit: Payer: Self-pay | Admitting: Orthopedic Surgery

## 2023-07-17 DIAGNOSIS — M5416 Radiculopathy, lumbar region: Secondary | ICD-10-CM

## 2023-07-24 ENCOUNTER — Ambulatory Visit
Admission: RE | Admit: 2023-07-24 | Discharge: 2023-07-24 | Disposition: A | Discharge status: Home / Self Care | Payer: Medicare Other | Source: Ambulatory Visit | Attending: Orthopedic Surgery | Admitting: Orthopedic Surgery

## 2023-07-24 DIAGNOSIS — M4727 Other spondylosis with radiculopathy, lumbosacral region: Secondary | ICD-10-CM | POA: Diagnosis not present

## 2023-07-24 DIAGNOSIS — M5416 Radiculopathy, lumbar region: Secondary | ICD-10-CM

## 2023-07-24 MED ORDER — IOPAMIDOL (ISOVUE-M 200) INJECTION 41%
1.0000 mL | Freq: Once | INTRAMUSCULAR | Status: AC
  Administered 2023-07-24: 1 mL via EPIDURAL

## 2023-07-24 MED ORDER — METHYLPREDNISOLONE ACETATE 40 MG/ML INJ SUSP (RADIOLOG
80.0000 mg | Freq: Once | INTRAMUSCULAR | Status: AC
  Administered 2023-07-24: 80 mg via EPIDURAL

## 2023-07-24 NOTE — Discharge Instructions (Signed)

## 2023-08-01 ENCOUNTER — Non-Acute Institutional Stay (SKILLED_NURSING_FACILITY): Payer: Medicare Other | Admitting: Internal Medicine

## 2023-08-01 ENCOUNTER — Encounter: Payer: Self-pay | Admitting: Internal Medicine

## 2023-08-01 DIAGNOSIS — E1151 Type 2 diabetes mellitus with diabetic peripheral angiopathy without gangrene: Secondary | ICD-10-CM

## 2023-08-01 DIAGNOSIS — L03116 Cellulitis of left lower limb: Secondary | ICD-10-CM | POA: Diagnosis not present

## 2023-08-01 DIAGNOSIS — E1122 Type 2 diabetes mellitus with diabetic chronic kidney disease: Secondary | ICD-10-CM

## 2023-08-01 DIAGNOSIS — N183 Chronic kidney disease, stage 3 unspecified: Secondary | ICD-10-CM

## 2023-08-01 DIAGNOSIS — M5416 Radiculopathy, lumbar region: Secondary | ICD-10-CM

## 2023-08-01 NOTE — Assessment & Plan Note (Signed)
Diabetic control is excellent as documented by current A1c 6.8%.

## 2023-08-01 NOTE — Assessment & Plan Note (Addendum)
She describes significant improvement of symptoms; she does have some residual numbness in her feet but she describes this as minor.

## 2023-08-01 NOTE — Assessment & Plan Note (Signed)
Diabetic control is excellent as documented by current A1c 6.8%. No change indicated. 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  This will need to be monitored as she is on polypharmacy.

## 2023-08-01 NOTE — Patient Instructions (Signed)
See assessment and plan under each diagnosis in the problem list and acutely for this visit 

## 2023-08-01 NOTE — Progress Notes (Unsigned)
NURSING HOME LOCATION:  [facility] ROOM NUMBER:  115 W  CODE STATUS:  Full Code  PCP:  Synthia Innocent NP  This is a nursing facility follow up visit of chronic medical diagnoses & to document compliance with Regulation 483.30 (c) in The Long Term Care Survey Manual Phase 2 which mandates caregiver visit ( visits can alternate among physician, PA or NP as per statutes) within 10 days of 30 days / 60 days/ 90 days post admission to SNF date    Interim medical record and care since last SNF visit was updated with review of diagnostic studies and change in clinical status since last visit were documented.  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  She has had posterior cervical fusion/foraminotomy. Labs are current as of 06/22/2023.  A1c was 6.8%.  She has high stage IIIa CKD with an estimated GFR of 59 and creatinine of 0.97.  Hypocalcemia has improved with current value of 8.8. Serially there is mild hyponatremia with range in values of 130- 136. Serially anemia is stable with H/H 10.0/32.3. Orthopedics saw the patient 07/14/2023 for chronic low back pain and sciatica in the left lower extremity.  MRI in 2023 had revealed extensive spinal stenosis.  Left foraminal and subarticular recess narrowing greatest at L4-L5 and L5-S1.  Epidural injection was arranged at [facility] imaging and completed 8/12.  Additionally he was asked to assess a possible Baker's cyst; he felt this was actually a lipoma and required no intervention.  Review of systems: She describes a significant response to the epidural injection.  She states that she only has minor numbness in her feet at this time.  She denies frank sciatica.  She also denies stool incontinence.  She does describe frequent urination and  urgency often wetting her pad.  She has had pruritus over the left lower extremity.  The scratching has resulted in early eschar with suggestion of mild cellulitis.  She has been applying triple antibiotic ointment to this area.  Constitutional: No fever, significant weight change, fatigue  Eyes: No redness, discharge, pain, vision change ENT/mouth: No nasal congestion,  purulent discharge, earache, change in hearing, sore throat  Cardiovascular: No chest pain, palpitations, paroxysmal nocturnal dyspnea, claudication, edema  Respiratory: No cough, sputum production, hemoptysis, DOE, significant snoring, apnea   Gastrointestinal: No heartburn, dysphagia, abdominal pain, nausea /vomiting, rectal bleeding, melena, change in bowels Genitourinary: No dysuria, hematuria, pyuria Musculoskeletal: No joint stiffness, joint swelling, weakness, pain Neurologic: No dizziness, headache, syncope, seizures Psychiatric: No significant anxiety, depression, insomnia, anorexia Endocrine: No change in hair/skin/nails, excessive thirst, excessive hunger, excessive urination  Hematologic/lymphatic: No significant bruising, lymphadenopathy, abnormal bleeding Allergy/immunology: No itchy/watery eyes, significant sneezing, urticaria, angioedema  Physical exam:  Pertinent or positive findings: She has somewhat of a hyponasal voice character.  Lacrimal glands are prominent.  She is edentulous.  Right carotid bruit is heard.  There is a grade 1/2 systolic murmur at the right base.  She has minor rales at the bases.  Abdomen is protuberant.  Pedal pulses are palpable.  She has 8 small areas of eschar ,some associated with some clinical cellulitis over the left lower extremity from the shin to the left lateral calf.  General appearance: Adequately nourished; no acute distress, increased work of breathing is present.   Lymphatic: No lymphadenopathy about the head, neck, axilla. Eyes:  No conjunctival inflammation or lid edema  is present. There is no scleral icterus. Ears:  External ear exam shows no significant lesions or deformities.   Nose:  External nasal examination shows no deformity or inflammation. Nasal mucosa are pink and moist without lesions, exudates Oral exam:  Lips and gums are healthy appearing. There is no oropharyngeal erythema or exudate. Neck:  No thyromegaly, masses, tenderness noted.    Heart:  Normal rate and regular rhythm. S1 and S2 normal without gallop, click, rub .  Lungs:  without wheezes, rhonchi,  rubs. Abdomen: Bowel sounds are normal. Abdomen is soft and nontender with no organomegaly, hernias, masses. GU: Deferred  Extremities:  No cyanosis, clubbing, edema  Neurologic exam :Balance, Rhomberg, finger to nose testing could not be completed due to clinical state Skin: Warm & dry w/o tenting.  See summary under each active problem in the Problem List with associated updated therapeutic plan

## 2023-08-01 NOTE — Assessment & Plan Note (Addendum)
Discontinue triple antibiotic ointment because of high risk of allergic reaction.  Generic Bactroban twice daily to cellulitic areas.

## 2023-08-31 ENCOUNTER — Non-Acute Institutional Stay (SKILLED_NURSING_FACILITY): Payer: Medicare Other | Admitting: Adult Health

## 2023-08-31 ENCOUNTER — Encounter: Payer: Self-pay | Admitting: Adult Health

## 2023-08-31 DIAGNOSIS — E1151 Type 2 diabetes mellitus with diabetic peripheral angiopathy without gangrene: Secondary | ICD-10-CM | POA: Diagnosis not present

## 2023-08-31 DIAGNOSIS — N3281 Overactive bladder: Secondary | ICD-10-CM

## 2023-08-31 DIAGNOSIS — K219 Gastro-esophageal reflux disease without esophagitis: Secondary | ICD-10-CM

## 2023-08-31 NOTE — Progress Notes (Signed)
Location:  [facility] Room Number: 115W Place of Service:  SNF (31)   CODE STATUS: Full Code  Allergies  Allergen Reactions   Gabapentin    Lisinopril    Penicillins Nausea Only    Has patient had a PCN reaction causing immediate rash, facial/tongue/throat swelling, SOB or lightheadedness with hypotension: Yes Has patient had a PCN reaction causing severe rash involving mucus membranes or skin necrosis: No Has patient had a PCN reaction that required hospitalization No Has patient had a PCN reaction occurring within the last 10 years: No If all of the above answers are "NO", then may proceed with Cephalosporin use.    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 She denies any uncontrolled pain. Stating that she is feeling good. Her overactive bladder is managed at this time. She denies any heart burn.   Past Medical History:  Diagnosis Date   Anxiety    Bowel obstruction (HCC)    Burning with urination 01/04/2016   Diabetes mellitus with neuropathy    Genital herpes    GERD (gastroesophageal reflux disease)    Hematuria 01/04/2016   Herpes 12/22/2015   Hyperlipidemia    Hypertension    LLQ abdominal tenderness 01/04/2016   Neuropathy    Pelvic pressure in female 01/04/2016   Recurrent UTI    Sciatic nerve disease    Small bowel obstruction (HCC) 04/27/2016   Urinary frequency 01/04/2016   Urticaria     Past Surgical History:  Procedure Laterality Date   ABDOMINAL HYSTERECTOMY      APPLICATION OF INTRAOPERATIVE CT SCAN N/A 12/19/2018   Procedure: APPLICATION OF INTRAOPERATIVE CT SCAN;  Surgeon: Maeola Harman, MD;  Location: [facility] OR;  Service: Neurosurgery;  Laterality: N/A;   bowel obstruction     COLON SURGERY     blocked colon   POSTERIOR CERVICAL FUSION/FORAMINOTOMY N/A 12/19/2018   Procedure: Cervical Seven to Thoracic One Posterior cervical fusion;  Surgeon: Maeola Harman, MD;  Location: [facility] OR;  Service: Neurosurgery;  Laterality: N/A;  Cervical Seven to Thoracic One Posterior cervical fusion    Social History   Socioeconomic History   Marital status: Widowed    Spouse name: Not on file   Number of children: Not on file   Years of education: Not on file   Highest education level: Not on file  Occupational History   Occupation: retired   Tobacco Use   Smoking status: Never   Smokeless tobacco: Current    Types: Snuff  Vaping Use   Vaping status: Never Used  Substance and Sexual Activity   Alcohol use: No   Drug use: No   Sexual activity: Not Currently    Birth control/protection: Surgical    Comment: hyst  Other Topics Concern   Not on file  Social History Narrative   Long term resident of [facility]    Social Determinants of Health  Financial Resource Strain: Not on file  Food Insecurity: Not on file  Transportation Needs: Not on file  Physical Activity: Not on file  Stress: Not on file  Social Connections: Not on file  Intimate Partner Violence: Not on file   Family History  Problem Relation Age of Onset   Diabetes Mother    Heart disease Brother    Hypertension Brother    Diabetes Brother    Hypertension Daughter    Diabetes Brother    Diabetes Brother    Alzheimer's disease Brother    Anxiety disorder Daughter       VITAL SIGNS BP 130/76   Pulse 78   Temp (!) 97.2 F (36.2 C)   Resp 18   Ht 5' (1.524 m)   Wt 191 lb 3.2 oz (86.7 kg)   SpO2 96%   BMI 37.34 kg/m   Outpatient Encounter Medications as of 08/31/2023  Medication Sig    acetaminophen (TYLENOL) 650 MG CR tablet Take 650 mg by mouth 3 (three) times daily.   amLODipine (NORVASC) 10 MG tablet Take 1 tablet (10 mg total) by mouth daily.   aspirin 81 MG chewable tablet Chew 81 mg by mouth daily.   busPIRone (BUSPAR) 5 MG tablet Take 5 mg by mouth 3 (three) times daily.   carvedilol (COREG) 3.125 MG tablet Take 3.125 mg by mouth 2 (two) times daily with a meal.   Cranberry 500 MG CAPS Take by mouth in the morning and at bedtime. For UTI prevention   DULoxetine (CYMBALTA) 60 MG capsule Take 60 mg by mouth daily. Pain management   Insulin Glargine (BASAGLAR KWIKPEN) 100 UNIT/ML Inject 10 Units into the skin at bedtime.   Insulin Pen Needle (BD AUTOSHIELD DUO) 30G X 5 MM MISC by Does not apply route. 3/16"   lamoTRIgine (LAMICTAL) 25 MG tablet Take 25 mg by mouth daily.   linagliptin (TRADJENTA) 5 MG TABS tablet Take 5 mg by mouth daily.   loratadine (CLARITIN) 10 MG tablet Take 10 mg by mouth daily.   losartan (COZAAR) 25 MG tablet Take 25 mg by mouth daily.   Melatonin 5 MG TABS Take 5 mg by mouth at bedtime.   meloxicam (MOBIC) 15 MG tablet Take 15 mg by mouth daily. Hip Pain   metFORMIN (GLUCOPHAGE) 1000 MG tablet Take 1,000 mg by mouth 2 (two) times daily with a meal.    mirabegron ER (MYRBETRIQ) 50 MG TB24 tablet Take 50 mg by mouth daily.   montelukast (SINGULAIR) 10 MG tablet Take 10 mg by mouth daily.   NON FORMULARY Diet: Regular diet   omeprazole (PRILOSEC) 20 MG capsule Take 20 mg by mouth daily. Special Instructions:  *TAKE ON AN EMPTY STOMACH* *DO NOT CRUSH*   polyethylene glycol (MIRALAX / GLYCOLAX) packet Take 17 g by mouth daily.   pregabalin (LYRICA) 25 MG capsule Take 1 capsule (25 mg total) by mouth in the morning and at bedtime.   senna-docusate (SENOKOT-S) 8.6-50 MG tablet Take 1 tablet by mouth at bedtime.    sertraline (ZOLOFT) 25 MG tablet Take 1 tablet (25 mg total) by mouth in the morning and at bedtime.   sertraline (ZOLOFT) 50 MG tablet  Take 50 mg by mouth daily.   simvastatin (ZOCOR) 10 MG tablet Take 10 mg by mouth at bedtime.    spironolactone (ALDACTONE) 25 MG tablet Take 25 mg by mouth daily.   triamcinolone (NASACORT) 55 MCG/ACT AERO nasal inhaler Place 1 spray into the nose daily.  valACYclovir (VALTREX) 500 MG tablet Take 500 mg by mouth daily.    No facility-administered encounter medications on file as of 08/31/2023.     SIGNIFICANT DIAGNOSTIC EXAMS  PREVIOUS   07-27-22: dexa t score: 0.636  NO NEW EXAMS    LABS REVIEWED PREVIOUS;     09-26-22: hgb a1c: 8.0 12-29-22: wbc 10.0; hgb 10.8; hct 35.2 mcv 82.6 plt 212; glucose 131; bun 21; creat 0.95; k+ 3.6; na++ 136; ca 8.6; gfr >60; protein 6.6 albumin 3.6; hgb A1c 7.2; chol 132; ldl 50; trig 134; hdl 55 01-26-23: d-dimer: 0.46 03-02-23: glucose 133; bun 23; creat 0.83; k+ 3.5; na++ 134; ca 9.1 gfr >60 03-23-23: tsh 5.410; vitamin B 12: 806   04-05-23: wbc 19.9; hgb 11.1; hct 34.6; mcv 78.8 plt 306; glucose 137; bun 48; creat 1.11; k+ 3.0; na++ 133; ca 9.0; gfr 50  04-06-23; wbc 19.8; hgb 11.1; hct 35.3; mcv 81.0 plt 312; glucose 95; bun 40; creat 1.21; k+ 4.0; na++ 137; ca 9.2; gfr 45  04-10-23: wbc 14.6; hgb 10.7; hct 34.2; mcv 79.4 plt 236; glucose 106; bun 21; creat 0.84; k+ 3.2; na++ 132; ca 8.7 gfr >60 04-13-23: wbc 10.6; hgb 10.0; hct 32.3; mcv 82.2 plt 219; glucose 96; bun 22; creat 0.92; k+ 3.0; na++ 136; ca 8.5; gfr >60 04-17-23: k+ 4.1 06-01-23: urine microalbumin 7.8 06-22-23: glucose 110; bun 28; creat 0.97; k+ 4.1; na++ 130; ca 8.8; gfr 59 hgb A1c 6.8  NO NEW LABS.      Review of Systems  Constitutional:  Negative for malaise/fatigue.  Respiratory:  Negative for cough and shortness of breath.   Cardiovascular:  Negative for chest pain, palpitations and leg swelling.  Gastrointestinal:  Negative for abdominal pain, constipation and heartburn.  Musculoskeletal:  Negative for back pain, joint pain and myalgias.  Skin: Negative.   Neurological:   Negative for dizziness.  Psychiatric/Behavioral:  The patient is not nervous/anxious.    Physical Exam Constitutional:      General: She is not in acute distress.    Appearance: She is well-developed. She is obese. She is not diaphoretic.  Neck:     Thyroid: No thyromegaly.  Cardiovascular:     Rate and Rhythm: Normal rate and regular rhythm.     Pulses: Normal pulses.     Heart sounds: Normal heart sounds.  Pulmonary:     Effort: Pulmonary effort is normal. No respiratory distress.     Breath sounds: Normal breath sounds.  Abdominal:     General: Bowel sounds are normal. There is no distension.     Palpations: Abdomen is soft.     Tenderness: There is no abdominal tenderness.  Musculoskeletal:        General: Normal range of motion.     Cervical back: Neck supple.     Right lower leg: No edema.     Left lower leg: No edema.  Lymphadenopathy:     Cervical: No cervical adenopathy.  Skin:    General: Skin is warm and dry.  Neurological:     Mental Status: She is alert. Mental status is at baseline.     Comments: 04-12-23: SLUMS 6/30       Psychiatric:        Mood and Affect: Mood normal.    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 Gastroesophageal reflux disease without esophagitis: will continue prilosec 20 mg daily   3. OAB: is on myrbetriq 50 mg daily   PREVIOUS    4. Chronic generalized pain/left sciatic pain: will continue tylenol 650 mg three times daily mobic 15 mg daily  lyrica 25 mg nightly and cymbalta 60 mg daily   4. Chronic non-seasonal allergic rhinitis: will continue claritin 10 mg daily; nasocort daily   5. Herpes: no recent outbreaks: will continue valtrex 500 mg daily  6. Vitamin B 12 deficiency: level is 1068; will monitor  7. Chronic constipation: will continue miralax daily and senna s  daily  8. Hypertension associated with type 2 diabetes mellitus: b/p 130/76 : will continue norvasc 10 mg daily coreg 3.125 mg twice daily  cozaar 25 mg daily     9. Major depression recurrent chronic: will continue cymbalta 60 mg daily (failed one wean; also takes for pain management) lamictal 25 mg daily for mood; zoloft 25 mg daily and buspar 5 mg three times daily for anxiety.   10.  CKD stage 3 due to type 2 diabetes mellitus: bun 28; creat 0.97; gfr 59   11. Major neurocognitive deficits: SLUMS 6/30.   12. Normocytic anemia: hgb 10.0; hct 32.3  13. Dyslipidemia associated with type 2 diabetes mellitus: LDL 55 will continue zocor 10 mg daily   14. Aortic atherosclerosis (ct 12-18-19) is on asa and statin     Synthia Innocent NP [facility]   call [phone]

## 2023-09-01 ENCOUNTER — Non-Acute Institutional Stay (SKILLED_NURSING_FACILITY): Payer: Medicare Other | Admitting: Adult Health

## 2023-09-01 ENCOUNTER — Encounter: Payer: Self-pay | Admitting: Adult Health

## 2023-09-01 DIAGNOSIS — F0390 Unspecified dementia without behavioral disturbance: Secondary | ICD-10-CM

## 2023-09-01 DIAGNOSIS — E1122 Type 2 diabetes mellitus with diabetic chronic kidney disease: Secondary | ICD-10-CM

## 2023-09-01 DIAGNOSIS — N183 Chronic kidney disease, stage 3 unspecified: Secondary | ICD-10-CM | POA: Diagnosis not present

## 2023-09-01 DIAGNOSIS — I700 Atherosclerosis of aorta: Secondary | ICD-10-CM | POA: Diagnosis not present

## 2023-09-01 NOTE — Progress Notes (Signed)
Location:  [facility] Room Number: North/115/W Place of Service:  SNF (31)   CODE STATUS: full   Allergies  Allergen Reactions   Gabapentin    Lisinopril    Penicillins Nausea Only    Has patient had a PCN reaction causing immediate rash, facial/tongue/throat swelling, SOB or lightheadedness with hypotension: Yes Has patient had a PCN reaction causing severe rash involving mucus membranes or skin necrosis: No Has patient had a PCN reaction that required hospitalization No Has patient had a PCN reaction occurring within the last 10 years: No If all of the above answers are "NO", then may proceed with Cephalosporin use.    Sulfa Antibiotics Nausea And Vomiting    Chief Complaint  Patient presents with   Acute Visit    Care Plan Meeting    HPI:  We have come together for her care plan meeting. BIMS 11/15 mood 3/30: decreased energy. Uses walker and wheelchair without falls. She is independent to supervision for her adl care. She is occasionally incontinent of bladder and continent of bowel. Dietary: regular diet feeds self; weight is 191.2 pounds; appetite 76-100%. Therapy: none at this time. Activities: does participate. She continues to be followed for her chronic illnesses including:   Aortic atherosclerosis  CKD stage 3 due to type 2 diabetes mellitus    Major neurocognitive disorder  Past Medical History:  Diagnosis Date   Anxiety    Bowel obstruction (HCC)    Burning with urination 01/04/2016   Diabetes mellitus with neuropathy    Genital herpes    GERD (gastroesophageal reflux disease)    Hematuria 01/04/2016   Herpes 12/22/2015   Hyperlipidemia    Hypertension    LLQ abdominal tenderness 01/04/2016   Neuropathy    Pelvic pressure in female 01/04/2016   Recurrent UTI    Sciatic nerve disease    Small bowel obstruction (HCC) 04/27/2016   Urinary frequency 01/04/2016   Urticaria     Past Surgical History:  Procedure Laterality Date    ABDOMINAL HYSTERECTOMY     APPLICATION OF INTRAOPERATIVE CT SCAN N/A 12/19/2018   Procedure: APPLICATION OF INTRAOPERATIVE CT SCAN;  Surgeon: Maeola Harman, MD;  Location: [facility] OR;  Service: Neurosurgery;  Laterality: N/A;   bowel obstruction     COLON SURGERY     blocked colon   POSTERIOR CERVICAL FUSION/FORAMINOTOMY N/A 12/19/2018   Procedure: Cervical Seven to Thoracic One Posterior cervical fusion;  Surgeon: Maeola Harman, MD;  Location: [facility] OR;  Service: Neurosurgery;  Laterality: N/A;  Cervical Seven to Thoracic One Posterior cervical fusion    Social History   Socioeconomic History   Marital status: Widowed    Spouse name: Not on file   Number of children: Not on file   Years of education: Not on file   Highest education level: Not on file  Occupational History   Occupation: retired   Tobacco Use   Smoking status: Never   Smokeless tobacco: Current    Types: Snuff  Vaping Use   Vaping status: Never Used  Substance and Sexual Activity   Alcohol use: No   Drug use: No   Sexual activity: Not Currently    Birth control/protection: Surgical    Comment: hyst  Other Topics Concern   Not on file  Social History Narrative   Long term resident of [facility]    Social Determinants of Health   Financial Resource Strain: Not on file  Food Insecurity: Not on file  Transportation  Needs: Not on file  Physical Activity: Not on file  Stress: Not on file  Social Connections: Not on file  Intimate Partner Violence: Not on file   Family History  Problem Relation Age of Onset   Diabetes Mother    Heart disease Brother    Hypertension Brother    Diabetes Brother    Hypertension Daughter    Diabetes Brother    Diabetes Brother    Alzheimer's disease Brother    Anxiety disorder Daughter       VITAL SIGNS BP 130/76   Pulse 78   Temp (!) 97.2 F (36.2 C)   Resp 18   Ht 5' (1.524 m)   Wt 191 lb 3.2 oz (86.7 kg)   SpO2 96%   BMI 37.34 kg/m   Outpatient Encounter Medications as  of 09/01/2023  Medication Sig   acetaminophen (TYLENOL) 650 MG CR tablet Take 650 mg by mouth 3 (three) times daily.   amLODipine (NORVASC) 10 MG tablet Take 1 tablet (10 mg total) by mouth daily.   aspirin 81 MG chewable tablet Chew 81 mg by mouth daily.   busPIRone (BUSPAR) 5 MG tablet Take 5 mg by mouth 3 (three) times daily.   carvedilol (COREG) 3.125 MG tablet Take 3.125 mg by mouth 2 (two) times daily with a meal.   Cranberry 500 MG CAPS Take by mouth in the morning and at bedtime. For UTI prevention   DULoxetine (CYMBALTA) 60 MG capsule Take 60 mg by mouth daily. Pain management   Insulin Glargine (BASAGLAR KWIKPEN) 100 UNIT/ML Inject 10 Units into the skin at bedtime.   Insulin Pen Needle (BD AUTOSHIELD DUO) 30G X 5 MM MISC by Does not apply route. 3/16"   lamoTRIgine (LAMICTAL) 25 MG tablet Take 25 mg by mouth daily.   linagliptin (TRADJENTA) 5 MG TABS tablet Take 5 mg by mouth daily.   loratadine (CLARITIN) 10 MG tablet Take 10 mg by mouth daily.   losartan (COZAAR) 25 MG tablet Take 25 mg by mouth daily.   Melatonin 5 MG TABS Take 5 mg by mouth at bedtime.   meloxicam (MOBIC) 15 MG tablet Take 15 mg by mouth daily. Hip Pain   metFORMIN (GLUCOPHAGE) 1000 MG tablet Take 1,000 mg by mouth 2 (two) times daily with a meal.    mirabegron ER (MYRBETRIQ) 50 MG TB24 tablet Take 50 mg by mouth daily.   montelukast (SINGULAIR) 10 MG tablet Take 10 mg by mouth daily.   mupirocin ointment (BACTROBAN) 2 % 1 Application 2 (two) times daily.   NON FORMULARY Diet: Regular diet   omeprazole (PRILOSEC) 20 MG capsule Take 20 mg by mouth daily. Special Instructions:  *TAKE ON AN EMPTY STOMACH* *DO NOT CRUSH*   polyethylene glycol (MIRALAX / GLYCOLAX) packet Take 17 g by mouth daily.   pregabalin (LYRICA) 25 MG capsule Take 1 capsule (25 mg total) by mouth in the morning and at bedtime.   senna-docusate (SENOKOT-S) 8.6-50 MG tablet Take 1 tablet by mouth at bedtime.    sertraline (ZOLOFT) 50 MG tablet  Take 50 mg by mouth daily.   simvastatin (ZOCOR) 10 MG tablet Take 10 mg by mouth at bedtime.    spironolactone (ALDACTONE) 25 MG tablet Take 25 mg by mouth daily.   triamcinolone (NASACORT) 55 MCG/ACT AERO nasal inhaler Place 1 spray into the nose daily.   valACYclovir (VALTREX) 500 MG tablet Take 500 mg by mouth daily.    [DISCONTINUED] sertraline (ZOLOFT) 25 MG tablet Take  1 tablet (25 mg total) by mouth in the morning and at bedtime.   No facility-administered encounter medications on file as of 09/01/2023.     SIGNIFICANT DIAGNOSTIC EXAMS  PREVIOUS   07-27-22: dexa t score: 0.636  NO NEW EXAMS    LABS REVIEWED PREVIOUS;      09-26-22: hgb a1c: 8.0 12-29-22: wbc 10.0; hgb 10.8; hct 35.2 mcv 82.6 plt 212; glucose 131; bun 21; creat 0.95; k+ 3.6; na++ 136; ca 8.6; gfr >60; protein 6.6 albumin 3.6; hgb A1c 7.2; chol 132; ldl 50; trig 134; hdl 55 01-26-23: d-dimer: 0.46 03-02-23: glucose 133; bun 23; creat 0.83; k+ 3.5; na++ 134; ca 9.1 gfr >60 03-23-23: tsh 5.410; vitamin B 12: 806   04-05-23: wbc 19.9; hgb 11.1; hct 34.6; mcv 78.8 plt 306; glucose 137; bun 48; creat 1.11; k+ 3.0; na++ 133; ca 9.0; gfr 50  04-06-23; wbc 19.8; hgb 11.1; hct 35.3; mcv 81.0 plt 312; glucose 95; bun 40; creat 1.21; k+ 4.0; na++ 137; ca 9.2; gfr 45  04-10-23: wbc 14.6; hgb 10.7; hct 34.2; mcv 79.4 plt 236; glucose 106; bun 21; creat 0.84; k+ 3.2; na++ 132; ca 8.7 gfr >60 04-13-23: wbc 10.6; hgb 10.0; hct 32.3; mcv 82.2 plt 219; glucose 96; bun 22; creat 0.92; k+ 3.0; na++ 136; ca 8.5; gfr >60 04-17-23: k+ 4.1 06-01-23: urine microalbumin 7.8 06-22-23: glucose 110; bun 28; creat 0.97; k+ 4.1; na++ 130; ca 8.8; gfr 59  hgb A1c 6.8  NO NEW LABS.    Review of Systems  Constitutional:  Negative for malaise/fatigue.  Respiratory:  Negative for cough and shortness of breath.   Cardiovascular:  Negative for chest pain, palpitations and leg swelling.  Gastrointestinal:  Negative for abdominal pain, constipation and  heartburn.  Musculoskeletal:  Negative for back pain, joint pain and myalgias.  Skin: Negative.   Neurological:  Negative for dizziness.  Psychiatric/Behavioral:  The patient is not nervous/anxious.     Physical Exam Constitutional:      General: She is not in acute distress.    Appearance: She is well-developed. She is obese. She is not diaphoretic.  Neck:     Thyroid: No thyromegaly.  Cardiovascular:     Rate and Rhythm: Normal rate and regular rhythm.     Pulses: Normal pulses.     Heart sounds: Normal heart sounds.  Pulmonary:     Effort: Pulmonary effort is normal. No respiratory distress.     Breath sounds: Normal breath sounds.  Abdominal:     General: Bowel sounds are normal. There is no distension.     Palpations: Abdomen is soft.     Tenderness: There is no abdominal tenderness.  Musculoskeletal:        General: Normal range of motion.     Cervical back: Neck supple.     Right lower leg: No edema.     Left lower leg: No edema.  Lymphadenopathy:     Cervical: No cervical adenopathy.  Skin:    General: Skin is warm and dry.  Neurological:     Mental Status: She is alert. Mental status is at baseline.     Comments: 04-12-23: SLUMS 6/30       Psychiatric:        Mood and Affect: Mood normal.     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   Time spent with patient: 40 minutes: medications; plan of care; dietary    Synthia Innocent NP [facility]  call [phone]

## 2023-09-07 ENCOUNTER — Other Ambulatory Visit: Payer: Self-pay | Admitting: Adult Health

## 2023-09-07 MED ORDER — PREGABALIN 25 MG PO CAPS: 25.0000 mg | ORAL_CAPSULE | Freq: Two times a day (BID) | ORAL | 0 refills | Status: DC

## 2023-09-19 DIAGNOSIS — Z794 Long term (current) use of insulin: Secondary | ICD-10-CM | POA: Diagnosis not present

## 2023-09-19 DIAGNOSIS — L602 Onychogryphosis: Secondary | ICD-10-CM | POA: Diagnosis not present

## 2023-09-19 DIAGNOSIS — E1151 Type 2 diabetes mellitus with diabetic peripheral angiopathy without gangrene: Secondary | ICD-10-CM | POA: Diagnosis not present

## 2023-09-19 DIAGNOSIS — Z23 Encounter for immunization: Secondary | ICD-10-CM | POA: Diagnosis not present

## 2023-09-19 DIAGNOSIS — L603 Nail dystrophy: Secondary | ICD-10-CM | POA: Diagnosis not present

## 2023-09-26 ENCOUNTER — Encounter: Payer: Self-pay | Admitting: Adult Health

## 2023-09-26 NOTE — Progress Notes (Signed)
Location:  [facility] Room Number: North/115/W Place of Service:  SNF (31)   CODE STATUS: Full Code  Allergies  Allergen Reactions  . Gabapentin   . Lisinopril   . Penicillins Nausea Only    Has patient had a PCN reaction causing immediate rash, facial/tongue/throat swelling, SOB or lightheadedness with hypotension: Yes Has patient had a PCN reaction causing severe rash involving mucus membranes or skin necrosis: No Has patient had a PCN reaction that required hospitalization No Has patient had a PCN reaction occurring within the last 10 years: No If all of the above answers are "NO", then may proceed with Cephalosporin use.   . Sulfa Antibiotics Nausea And Vomiting    Chief Complaint  Patient presents with  . Medical Management of Chronic Issues    HPI:    Past Medical History:  Diagnosis Date  . Anxiety   . Bowel obstruction (HCC)   . Burning with urination 01/04/2016  . Diabetes mellitus with neuropathy   . Genital herpes   . GERD (gastroesophageal reflux disease)   . Hematuria 01/04/2016  . Herpes 12/22/2015  . Hyperlipidemia   . Hypertension   . LLQ abdominal tenderness 01/04/2016  . Neuropathy   . Pelvic pressure in female 01/04/2016  . Recurrent UTI   . Sciatic nerve disease   . Small bowel obstruction (HCC) 04/27/2016  . Urinary frequency 01/04/2016  . Urticaria     Past Surgical History:  Procedure Laterality Date  . ABDOMINAL HYSTERECTOMY    . APPLICATION OF INTRAOPERATIVE CT SCAN N/A 12/19/2018   Procedure: APPLICATION OF INTRAOPERATIVE CT SCAN;  Surgeon: Maeola Harman, MD;  Location: [facility] OR;  Service: Neurosurgery;  Laterality: N/A;  . bowel obstruction    . COLON SURGERY     blocked colon  . POSTERIOR CERVICAL FUSION/FORAMINOTOMY N/A 12/19/2018   Procedure: Cervical Seven to Thoracic One Posterior cervical fusion;  Surgeon: Maeola Harman, MD;  Location: [facility] OR;  Service: Neurosurgery;  Laterality: N/A;  Cervical Seven to  Thoracic One Posterior cervical fusion    Social History   Socioeconomic History  . Marital status: Widowed    Spouse name: Not on file  . Number of children: Not on file  . Years of education: Not on file  . Highest education level: Not on file  Occupational History  . Occupation: retired   Tobacco Use  . Smoking status: Never  . Smokeless tobacco: Current    Types: Snuff  Vaping Use  . Vaping status: Never Used  Substance and Sexual Activity  . Alcohol use: No  . Drug use: No  . Sexual activity: Not Currently    Birth control/protection: Surgical    Comment: hyst  Other Topics Concern  . Not on file  Social History Narrative   Long term resident of [facility]    Social Determinants of Health   Financial Resource Strain: Not on file  Food Insecurity: Not on file  Transportation Needs: Not on file  Physical Activity: Not on file  Stress: Not on file  Social Connections: Not on file  Intimate Partner Violence: Not on file   Family History  Problem Relation Age of Onset  . Diabetes Mother   . Heart disease Brother   . Hypertension Brother   . Diabetes Brother   . Hypertension Daughter   . Diabetes Brother   . Diabetes Brother   . Alzheimer's disease Brother   . Anxiety disorder Daughter       VITAL  SIGNS BP (!) 154/78   Pulse 84   Temp (!) 97.1 F (36.2 C)   Resp (!) 22   Ht 5' (1.524 m)   Wt 192 lb 9.6 oz (87.4 kg)   SpO2 99%   BMI 37.61 kg/m   Outpatient Encounter Medications as of 09/26/2023  Medication Sig  . acetaminophen (TYLENOL) 650 MG CR tablet Take 650 mg by mouth 3 (three) times daily.  Marland Kitchen amLODipine (NORVASC) 10 MG tablet Take 1 tablet (10 mg total) by mouth daily.  Marland Kitchen aspirin 81 MG chewable tablet Chew 81 mg by mouth daily.  . busPIRone (BUSPAR) 5 MG tablet Take 5 mg by mouth 3 (three) times daily.  . carvedilol (COREG) 3.125 MG tablet Take 3.125 mg by mouth 2 (two) times daily with a meal.  . Cranberry 500 MG CAPS Take by mouth in the  morning and at bedtime. For UTI prevention  . diclofenac Sodium (VOLTAREN) 1 % GEL Apply topically 3 (three) times daily as needed.  . DULoxetine (CYMBALTA) 60 MG capsule Take 60 mg by mouth daily. Pain management  . Insulin Glargine (BASAGLAR KWIKPEN) 100 UNIT/ML Inject 10 Units into the skin at bedtime.  . Insulin Pen Needle (BD AUTOSHIELD DUO) 30G X 5 MM MISC by Does not apply route. 3/16"  . lamoTRIgine (LAMICTAL) 25 MG tablet Take 25 mg by mouth daily.  Marland Kitchen linagliptin (TRADJENTA) 5 MG TABS tablet Take 5 mg by mouth daily.  Marland Kitchen loratadine (CLARITIN) 10 MG tablet Take 10 mg by mouth daily.  Marland Kitchen losartan (COZAAR) 25 MG tablet Take 25 mg by mouth daily.  . Melatonin 5 MG TABS Take 5 mg by mouth at bedtime.  . meloxicam (MOBIC) 15 MG tablet Take 15 mg by mouth daily. Hip Pain  . metFORMIN (GLUCOPHAGE) 1000 MG tablet Take 1,000 mg by mouth 2 (two) times daily with a meal.   . mirabegron ER (MYRBETRIQ) 50 MG TB24 tablet Take 50 mg by mouth daily.  . montelukast (SINGULAIR) 10 MG tablet Take 10 mg by mouth daily.  . mupirocin ointment (BACTROBAN) 2 % 1 Application 2 (two) times daily.  . NON FORMULARY Diet: Regular diet  . omeprazole (PRILOSEC) 20 MG capsule Take 20 mg by mouth daily. Special Instructions:  *TAKE ON AN EMPTY STOMACH* *DO NOT CRUSH*  . polyethylene glycol (MIRALAX / GLYCOLAX) packet Take 17 g by mouth daily.  . pregabalin (LYRICA) 25 MG capsule Take 1 capsule (25 mg total) by mouth in the morning and at bedtime.  . senna-docusate (SENOKOT-S) 8.6-50 MG tablet Take 1 tablet by mouth at bedtime.   . sertraline (ZOLOFT) 50 MG tablet Take 50 mg by mouth daily.  . simvastatin (ZOCOR) 10 MG tablet Take 10 mg by mouth at bedtime.   Marland Kitchen spironolactone (ALDACTONE) 25 MG tablet Take 25 mg by mouth daily.  Marland Kitchen triamcinolone (NASACORT) 55 MCG/ACT AERO nasal inhaler Place 1 spray into the nose daily.  . valACYclovir (VALTREX) 500 MG tablet Take 500 mg by mouth daily.    No facility-administered  encounter medications on file as of 09/26/2023.     SIGNIFICANT DIAGNOSTIC EXAMS       ASSESSMENT/ PLAN:     Synthia Innocent NP [facility]  Contact [phone] Monday through Friday 8am- 5pm  After hours call [phone]   This encounter was created in error - please disregard.

## 2023-09-27 ENCOUNTER — Non-Acute Institutional Stay (SKILLED_NURSING_FACILITY): Payer: Medicare Other | Admitting: Adult Health

## 2023-09-27 ENCOUNTER — Encounter: Payer: Self-pay | Admitting: Adult Health

## 2023-09-27 DIAGNOSIS — E1151 Type 2 diabetes mellitus with diabetic peripheral angiopathy without gangrene: Secondary | ICD-10-CM

## 2023-09-27 DIAGNOSIS — B009 Herpesviral infection, unspecified: Secondary | ICD-10-CM

## 2023-09-27 DIAGNOSIS — J3089 Other allergic rhinitis: Secondary | ICD-10-CM

## 2023-09-27 DIAGNOSIS — M5432 Sciatica, left side: Secondary | ICD-10-CM

## 2023-09-27 DIAGNOSIS — I700 Atherosclerosis of aorta: Secondary | ICD-10-CM | POA: Diagnosis not present

## 2023-09-27 DIAGNOSIS — G8929 Other chronic pain: Secondary | ICD-10-CM

## 2023-09-27 NOTE — Progress Notes (Signed)
Location:  [facility] Room Number: 115 Place of Service:  SNF (31)   CODE STATUS: full   Allergies  Allergen Reactions   Gabapentin    Lisinopril    Penicillins Nausea Only    Has patient had a PCN reaction causing immediate rash, facial/tongue/throat swelling, SOB or lightheadedness with hypotension: Yes Has patient had a PCN reaction causing severe rash involving mucus membranes or skin necrosis: No Has patient had a PCN reaction that required hospitalization No Has patient had a PCN reaction occurring within the last 10 years: No If all of the above answers are "NO", then may proceed with Cephalosporin use.    Sulfa Antibiotics Nausea And Vomiting    Chief Complaint  Patient presents with   Medical Management of Chronic Issues        Chronic generalized pain/left sciatic pain:     Chronic non-seasonal allergic rhinitis:  Herpes:      HPI:  She is a 82 year old long term resident of this facility being seen for the management of her chronic illnesses: Chronic generalized pain/left sciatic pain:     Chronic non-seasonal allergic rhinitis:  Herpes. There are no reports of uncontrolled pain. She does ambulate with a walker. Her weight is without significant change   Past Medical History:  Diagnosis Date   Anxiety    Bowel obstruction (HCC)    Burning with urination 01/04/2016   Diabetes mellitus with neuropathy    Genital herpes    GERD (gastroesophageal reflux disease)    Hematuria 01/04/2016   Herpes 12/22/2015   Hyperlipidemia    Hypertension    LLQ abdominal tenderness 01/04/2016   Neuropathy    Pelvic pressure in female 01/04/2016   Recurrent UTI    Sciatic nerve disease    Small bowel obstruction (HCC) 04/27/2016   Urinary frequency 01/04/2016   Urticaria     Past Surgical History:  Procedure Laterality Date   ABDOMINAL HYSTERECTOMY     APPLICATION OF INTRAOPERATIVE CT SCAN N/A 12/19/2018   Procedure: APPLICATION OF  INTRAOPERATIVE CT SCAN;  Surgeon: Maeola Harman, MD;  Location: [facility] OR;  Service: Neurosurgery;  Laterality: N/A;   bowel obstruction     COLON SURGERY     blocked colon   POSTERIOR CERVICAL FUSION/FORAMINOTOMY N/A 12/19/2018   Procedure: Cervical Seven to Thoracic One Posterior cervical fusion;  Surgeon: Maeola Harman, MD;  Location: [facility] OR;  Service: Neurosurgery;  Laterality: N/A;  Cervical Seven to Thoracic One Posterior cervical fusion    Social History   Socioeconomic History   Marital status: Widowed    Spouse name: Not on file   Number of children: Not on file   Years of education: Not on file   Highest education level: Not on file  Occupational History   Occupation: retired   Tobacco Use   Smoking status: Never   Smokeless tobacco: Current    Types: Snuff  Vaping Use   Vaping status: Never Used  Substance and Sexual Activity   Alcohol use: No   Drug use: No   Sexual activity: Not Currently    Birth control/protection: Surgical    Comment: hyst  Other Topics Concern   Not on file  Social History Narrative   Long term resident of [facility]    Social Determinants of Health   Financial Resource Strain: Not on file  Food Insecurity: Not on file  Transportation Needs: Not on file  Physical Activity: Not on file  Stress:  Not on file  Social Connections: Not on file  Intimate Partner Violence: Not on file   Family History  Problem Relation Age of Onset   Diabetes Mother    Heart disease Brother    Hypertension Brother    Diabetes Brother    Hypertension Daughter    Diabetes Brother    Diabetes Brother    Alzheimer's disease Brother    Anxiety disorder Daughter       VITAL SIGNS BP (!) 154/78   Pulse 84   Temp (!) 97.1 F (36.2 C)   Resp 20   Ht 5' (1.524 m)   Wt 192 lb 9.6 oz (87.4 kg)   SpO2 99%   BMI 37.61 kg/m   Outpatient Encounter Medications as of 09/27/2023  Medication Sig   acetaminophen (TYLENOL) 650 MG CR tablet Take 650 mg by mouth 3 (three)  times daily.   amLODipine (NORVASC) 10 MG tablet Take 1 tablet (10 mg total) by mouth daily.   aspirin 81 MG chewable tablet Chew 81 mg by mouth daily.   busPIRone (BUSPAR) 5 MG tablet Take 5 mg by mouth 3 (three) times daily.   carvedilol (COREG) 3.125 MG tablet Take 3.125 mg by mouth 2 (two) times daily with a meal.   Cranberry 500 MG CAPS Take by mouth in the morning and at bedtime. For UTI prevention   diclofenac Sodium (VOLTAREN) 1 % GEL Apply topically 3 (three) times daily as needed.   DULoxetine (CYMBALTA) 60 MG capsule Take 60 mg by mouth daily. Pain management   Insulin Glargine (BASAGLAR KWIKPEN) 100 UNIT/ML Inject 10 Units into the skin at bedtime.   Insulin Pen Needle (BD AUTOSHIELD DUO) 30G X 5 MM MISC by Does not apply route. 3/16"   lamoTRIgine (LAMICTAL) 25 MG tablet Take 25 mg by mouth daily.   linagliptin (TRADJENTA) 5 MG TABS tablet Take 5 mg by mouth daily.   loratadine (CLARITIN) 10 MG tablet Take 10 mg by mouth daily.   losartan (COZAAR) 25 MG tablet Take 25 mg by mouth daily.   Melatonin 5 MG TABS Take 5 mg by mouth at bedtime.   meloxicam (MOBIC) 15 MG tablet Take 15 mg by mouth daily. Hip Pain   metFORMIN (GLUCOPHAGE) 1000 MG tablet Take 1,000 mg by mouth 2 (two) times daily with a meal.    mirabegron ER (MYRBETRIQ) 50 MG TB24 tablet Take 50 mg by mouth daily.   montelukast (SINGULAIR) 10 MG tablet Take 10 mg by mouth daily.   mupirocin ointment (BACTROBAN) 2 % 1 Application 2 (two) times daily.   NON FORMULARY Diet: Regular diet   omeprazole (PRILOSEC) 20 MG capsule Take 20 mg by mouth daily. Special Instructions:  *TAKE ON AN EMPTY STOMACH* *DO NOT CRUSH*   polyethylene glycol (MIRALAX / GLYCOLAX) packet Take 17 g by mouth daily.   pregabalin (LYRICA) 25 MG capsule Take 1 capsule (25 mg total) by mouth in the morning and at bedtime.   senna-docusate (SENOKOT-S) 8.6-50 MG tablet Take 1 tablet by mouth at bedtime.    sertraline (ZOLOFT) 50 MG tablet Take 50 mg by  mouth daily.   simvastatin (ZOCOR) 10 MG tablet Take 10 mg by mouth at bedtime.    spironolactone (ALDACTONE) 25 MG tablet Take 25 mg by mouth daily.   triamcinolone (NASACORT) 55 MCG/ACT AERO nasal inhaler Place 1 spray into the nose daily.   valACYclovir (VALTREX) 500 MG tablet Take 500 mg by mouth daily.    No facility-administered  encounter medications on file as of 09/27/2023.     SIGNIFICANT DIAGNOSTIC EXAMS  PREVIOUS   07-27-22: dexa t score: 0.636  NO NEW EXAMS    LABS REVIEWED PREVIOUS;     09-26-22: hgb a1c: 8.0 12-29-22: wbc 10.0; hgb 10.8; hct 35.2 mcv 82.6 plt 212; glucose 131; bun 21; creat 0.95; k+ 3.6; na++ 136; ca 8.6; gfr >60; protein 6.6 albumin 3.6; hgb A1c 7.2; chol 132; ldl 50; trig 134; hdl 55 01-26-23: d-dimer: 0.46 03-02-23: glucose 133; bun 23; creat 0.83; k+ 3.5; na++ 134; ca 9.1 gfr >60 03-23-23: tsh 5.410; vitamin B 12: 806   04-05-23: wbc 19.9; hgb 11.1; hct 34.6; mcv 78.8 plt 306; glucose 137; bun 48; creat 1.11; k+ 3.0; na++ 133; ca 9.0; gfr 50  04-06-23; wbc 19.8; hgb 11.1; hct 35.3; mcv 81.0 plt 312; glucose 95; bun 40; creat 1.21; k+ 4.0; na++ 137; ca 9.2; gfr 45  04-10-23: wbc 14.6; hgb 10.7; hct 34.2; mcv 79.4 plt 236; glucose 106; bun 21; creat 0.84; k+ 3.2; na++ 132; ca 8.7 gfr >60 04-13-23: wbc 10.6; hgb 10.0; hct 32.3; mcv 82.2 plt 219; glucose 96; bun 22; creat 0.92; k+ 3.0; na++ 136; ca 8.5; gfr >60 04-17-23: k+ 4.1 06-01-23: urine microalbumin 7.8 06-22-23: glucose 110; bun 28; creat 0.97; k+ 4.1; na++ 130; ca 8.8; gfr 59 hgb A1c 6.8  NO NEW LABS.     Review of Systems  Constitutional:  Negative for malaise/fatigue.  Respiratory:  Negative for cough and shortness of breath.   Cardiovascular:  Negative for chest pain, palpitations and leg swelling.  Gastrointestinal:  Negative for abdominal pain, constipation and heartburn.  Musculoskeletal:  Negative for back pain, joint pain and myalgias.  Skin: Negative.   Neurological:  Negative for  dizziness.  Psychiatric/Behavioral:  The patient is not nervous/anxious.    Physical Exam Constitutional:      General: She is not in acute distress.    Appearance: She is well-developed. She is obese. She is not diaphoretic.  Neck:     Thyroid: No thyromegaly.  Cardiovascular:     Rate and Rhythm: Normal rate and regular rhythm.     Pulses: Normal pulses.     Heart sounds: Normal heart sounds.  Pulmonary:     Effort: Pulmonary effort is normal. No respiratory distress.     Breath sounds: Normal breath sounds.  Abdominal:     General: Bowel sounds are normal. There is no distension.     Palpations: Abdomen is soft.     Tenderness: There is no abdominal tenderness.  Musculoskeletal:        General: Normal range of motion.     Cervical back: Neck supple.     Right lower leg: No edema.     Left lower leg: No edema.  Lymphadenopathy:     Cervical: No cervical adenopathy.  Skin:    General: Skin is warm and dry.  Neurological:     Mental Status: She is alert. Mental status is at baseline.     Comments: 04-12-23: SLUMS 6/30        Psychiatric:        Mood and Affect: Mood normal.       ASSESSMENT/ PLAN:  TODAY  Chronic generalized pain/left sciatic pain: will continue tylenol 650 mg three times daily; mobic 15 mg daily; lyrica 25 mg nightly and cymbalta 60 mg daily   2. Chronic non-seasonal allergic rhinitis: will continue claritin 10 mg daily; nasocaort daily  3.  Herpes: no recent outbreak; will continue valtrex 500 mg daily    PREVIOUS    4. Vitamin B 12 deficiency: level is 1068; will monitor  5. Chronic constipation: will continue miralax daily and senna s daily  6. Hypertension associated with type 2 diabetes mellitus: b/p 154/78 : will continue norvasc 10 mg cozaar 25 mg daily   will increase coreg to 6.25 mg twice daily   7. Major depression recurrent chronic: will continue cymbalta 60 mg daily (failed one wean; also takes for pain management) lamictal 25 mg  daily for mood; zoloft 25 mg daily and buspar 5 mg three times daily for anxiety.   8.  CKD stage 3 due to type 2 diabetes mellitus: bun 28; creat 0.97; gfr 59   9. Major neurocognitive deficits: SLUMS 6/30.   10. Normocytic anemia: hgb 10.0; hct 32.3  11. Dyslipidemia associated with type 2 diabetes mellitus: LDL 55 will continue zocor 10 mg daily   12. Aortic atherosclerosis (ct 12-18-19) is on asa and statin   14. Type 2 diabetes mellitus with peripheral angiopathy: hgb A1c 6.8; will continue metformin 1 gm twice daily tradjenta 5 mg daily; semglee 10 units nightly  is on asa statin  15. Gastroesophageal reflux disease without esophagitis: will continue prilosec 20 mg daily   16. OAB: is on myrbetriq 50 mg daily    Synthia Innocent NP [facility]  s call [phone]

## 2023-10-10 ENCOUNTER — Other Ambulatory Visit: Payer: Self-pay | Admitting: Adult Health

## 2023-10-10 MED ORDER — PREGABALIN 25 MG PO CAPS: 25.0000 mg | ORAL_CAPSULE | Freq: Two times a day (BID) | ORAL | 0 refills | Status: DC

## 2023-10-17 ENCOUNTER — Non-Acute Institutional Stay (SKILLED_NURSING_FACILITY): Payer: Medicare Other | Admitting: Adult Health

## 2023-10-17 DIAGNOSIS — E669 Obesity, unspecified: Secondary | ICD-10-CM | POA: Diagnosis not present

## 2023-10-17 DIAGNOSIS — I152 Hypertension secondary to endocrine disorders: Secondary | ICD-10-CM | POA: Diagnosis not present

## 2023-10-17 DIAGNOSIS — E1159 Type 2 diabetes mellitus with other circulatory complications: Secondary | ICD-10-CM

## 2023-10-18 ENCOUNTER — Encounter: Payer: Self-pay | Admitting: Adult Health

## 2023-10-18 NOTE — Progress Notes (Signed)
Location:  [facility] Room Number: 115 Place of Service:  SNF (31)   CODE STATUS: full   Allergies  Allergen Reactions   Gabapentin    Lisinopril    Penicillins Nausea Only    Has patient had a PCN reaction causing immediate rash, facial/tongue/throat swelling, SOB or lightheadedness with hypotension: Yes Has patient had a PCN reaction causing severe rash involving mucus membranes or skin necrosis: No Has patient had a PCN reaction that required hospitalization No Has patient had a PCN reaction occurring within the last 10 years: No If all of the above answers are "NO", then may proceed with Cephalosporin use.    Sulfa Antibiotics Nausea And Vomiting    Chief Complaint  Patient presents with   Acute Visit    Weight loss     HPI:  Her weights have been variable. From January to May she has lost 17 pounds; from May through November she has gained 20 pounds. This is a net gain of 3 pounds. She has had pneumonia in April. She has required 2 bursts of prednisone as well.  Her blood pressure today is 159/60. She is presently taking norvasc; coreg; cozaar to manage her blood pressure. This reading today is an outlier. She denies any chest pain; no changes in vision.    Past Medical History:  Diagnosis Date   Anxiety    Bowel obstruction (HCC)    Burning with urination 01/04/2016   Diabetes mellitus with neuropathy    Genital herpes    GERD (gastroesophageal reflux disease)    Hematuria 01/04/2016   Herpes 12/22/2015   Hyperlipidemia    Hypertension    LLQ abdominal tenderness 01/04/2016   Neuropathy    Pelvic pressure in female 01/04/2016   Recurrent UTI    Sciatic nerve disease    Small bowel obstruction (HCC) 04/27/2016   Urinary frequency 01/04/2016   Urticaria     Past Surgical History:  Procedure Laterality Date   ABDOMINAL HYSTERECTOMY     APPLICATION OF INTRAOPERATIVE CT SCAN N/A 12/19/2018   Procedure: APPLICATION OF INTRAOPERATIVE CT  SCAN;  Surgeon: Maeola Harman, MD;  Location: [facility] OR;  Service: Neurosurgery;  Laterality: N/A;   bowel obstruction     COLON SURGERY     blocked colon   POSTERIOR CERVICAL FUSION/FORAMINOTOMY N/A 12/19/2018   Procedure: Cervical Seven to Thoracic One Posterior cervical fusion;  Surgeon: Maeola Harman, MD;  Location: [facility] OR;  Service: Neurosurgery;  Laterality: N/A;  Cervical Seven to Thoracic One Posterior cervical fusion    Social History   Socioeconomic History   Marital status: Widowed    Spouse name: Not on file   Number of children: Not on file   Years of education: Not on file   Highest education level: Not on file  Occupational History   Occupation: retired   Tobacco Use   Smoking status: Never   Smokeless tobacco: Current    Types: Snuff  Vaping Use   Vaping status: Never Used  Substance and Sexual Activity   Alcohol use: No   Drug use: No   Sexual activity: Not Currently    Birth control/protection: Surgical    Comment: hyst  Other Topics Concern   Not on file  Social History Narrative   Long term resident of [facility]    Social Determinants of Health   Financial Resource Strain: Not on file  Food Insecurity: Not on file  Transportation Needs: Not on file  Physical Activity:  Not on file  Stress: Not on file  Social Connections: Not on file  Intimate Partner Violence: Not on file   Family History  Problem Relation Age of Onset   Diabetes Mother    Heart disease Brother    Hypertension Brother    Diabetes Brother    Hypertension Daughter    Diabetes Brother    Diabetes Brother    Alzheimer's disease Brother    Anxiety disorder Daughter       VITAL SIGNS BP (!) 159/60   Pulse 80   Temp 98.8 F (37.1 C)   Resp 20   Ht 5' (1.524 m)   Wt 194 lb 6.4 oz (88.2 kg)   SpO2 98%   BMI 37.97 kg/m   Outpatient Encounter Medications as of 10/17/2023  Medication Sig   acetaminophen (TYLENOL) 650 MG CR tablet Take 650 mg by mouth 3 (three) times daily.    amLODipine (NORVASC) 10 MG tablet Take 1 tablet (10 mg total) by mouth daily.   aspirin 81 MG chewable tablet Chew 81 mg by mouth daily.   busPIRone (BUSPAR) 5 MG tablet Take 5 mg by mouth 3 (three) times daily.   carvedilol (COREG) 6.25 MG tablet Take 6.25 mg by mouth 2 (two) times daily with a meal.   Cranberry 500 MG CAPS Take by mouth in the morning and at bedtime. For UTI prevention   diclofenac Sodium (VOLTAREN) 1 % GEL Apply topically 3 (three) times daily as needed.   DULoxetine (CYMBALTA) 60 MG capsule Take 60 mg by mouth daily. Pain management   Insulin Glargine (BASAGLAR KWIKPEN) 100 UNIT/ML Inject 10 Units into the skin at bedtime.   Insulin Pen Needle (BD AUTOSHIELD DUO) 30G X 5 MM MISC by Does not apply route. 3/16"   lamoTRIgine (LAMICTAL) 25 MG tablet Take 25 mg by mouth daily.   linagliptin (TRADJENTA) 5 MG TABS tablet Take 5 mg by mouth daily.   loratadine (CLARITIN) 10 MG tablet Take 10 mg by mouth daily.   losartan (COZAAR) 25 MG tablet Take 25 mg by mouth daily.   Melatonin 5 MG TABS Take 5 mg by mouth at bedtime.   meloxicam (MOBIC) 15 MG tablet Take 15 mg by mouth daily. Hip Pain   metFORMIN (GLUCOPHAGE) 1000 MG tablet Take 1,000 mg by mouth 2 (two) times daily with a meal.    mirabegron ER (MYRBETRIQ) 50 MG TB24 tablet Take 50 mg by mouth daily.   montelukast (SINGULAIR) 10 MG tablet Take 10 mg by mouth daily.   mupirocin ointment (BACTROBAN) 2 % 1 Application 2 (two) times daily.   NON FORMULARY Diet: Regular diet   omeprazole (PRILOSEC) 20 MG capsule Take 20 mg by mouth daily. Special Instructions:  *TAKE ON AN EMPTY STOMACH* *DO NOT CRUSH*   polyethylene glycol (MIRALAX / GLYCOLAX) packet Take 17 g by mouth daily.   pregabalin (LYRICA) 25 MG capsule Take 1 capsule (25 mg total) by mouth in the morning and at bedtime.   senna-docusate (SENOKOT-S) 8.6-50 MG tablet Take 1 tablet by mouth at bedtime.    sertraline (ZOLOFT) 50 MG tablet Take 50 mg by mouth daily.    simvastatin (ZOCOR) 10 MG tablet Take 10 mg by mouth at bedtime.    spironolactone (ALDACTONE) 25 MG tablet Take 25 mg by mouth daily.   triamcinolone (NASACORT) 55 MCG/ACT AERO nasal inhaler Place 1 spray into the nose daily.   valACYclovir (VALTREX) 500 MG tablet Take 500 mg by mouth daily.  No facility-administered encounter medications on file as of 10/17/2023.     SIGNIFICANT DIAGNOSTIC EXAMS   PREVIOUS   07-27-22: dexa t score: 0.636  NO NEW EXAMS    LABS REVIEWED PREVIOUS;     12-29-22: wbc 10.0; hgb 10.8; hct 35.2 mcv 82.6 plt 212; glucose 131; bun 21; creat 0.95; k+ 3.6; na++ 136; ca 8.6; gfr >60; protein 6.6 albumin 3.6; hgb A1c 7.2; chol 132; ldl 50; trig 134; hdl 55 01-26-23: d-dimer: 0.46 03-02-23: glucose 133; bun 23; creat 0.83; k+ 3.5; na++ 134; ca 9.1 gfr >60 03-23-23: tsh 5.410; vitamin B 12: 806   04-05-23: wbc 19.9; hgb 11.1; hct 34.6; mcv 78.8 plt 306; glucose 137; bun 48; creat 1.11; k+ 3.0; na++ 133; ca 9.0; gfr 50  04-06-23; wbc 19.8; hgb 11.1; hct 35.3; mcv 81.0 plt 312; glucose 95; bun 40; creat 1.21; k+ 4.0; na++ 137; ca 9.2; gfr 45  04-10-23: wbc 14.6; hgb 10.7; hct 34.2; mcv 79.4 plt 236; glucose 106; bun 21; creat 0.84; k+ 3.2; na++ 132; ca 8.7 gfr >60 04-13-23: wbc 10.6; hgb 10.0; hct 32.3; mcv 82.2 plt 219; glucose 96; bun 22; creat 0.92; k+ 3.0; na++ 136; ca 8.5; gfr >60 04-17-23: k+ 4.1 06-01-23: urine microalbumin 7.8 06-22-23: glucose 110; bun 28; creat 0.97; k+ 4.1; na++ 130; ca 8.8; gfr 59 hgb A1c 6.8  NO NEW LABS.     Review of Systems  Constitutional:  Negative for malaise/fatigue.  Respiratory:  Negative for cough and shortness of breath.   Cardiovascular:  Negative for chest pain, palpitations and leg swelling.  Gastrointestinal:  Negative for abdominal pain, constipation and heartburn.  Musculoskeletal:  Negative for back pain, joint pain and myalgias.  Skin: Negative.   Neurological:  Negative for dizziness.  Psychiatric/Behavioral:  The  patient is not nervous/anxious.    Physical Exam Constitutional:      General: She is not in acute distress.    Appearance: She is well-developed. She is obese. She is not diaphoretic.  Neck:     Thyroid: No thyromegaly.  Cardiovascular:     Rate and Rhythm: Normal rate and regular rhythm.     Pulses: Normal pulses.     Heart sounds: Normal heart sounds.  Pulmonary:     Effort: Pulmonary effort is normal. No respiratory distress.     Breath sounds: Normal breath sounds.  Abdominal:     General: Bowel sounds are normal. There is no distension.     Palpations: Abdomen is soft.     Tenderness: There is no abdominal tenderness.  Musculoskeletal:        General: Normal range of motion.     Cervical back: Neck supple.     Right lower leg: No edema.     Left lower leg: No edema.  Lymphadenopathy:     Cervical: No cervical adenopathy.  Skin:    General: Skin is warm and dry.  Neurological:     Mental Status: She is alert. Mental status is at baseline.     Comments: 04-12-23: SLUMS 6/30         Psychiatric:        Mood and Affect: Mood normal.      ASSESSMENT/ PLAN:  TODAY  Hypertension associated with type 2 diabetes mellitus Obesity (BMI 30-39)   At this time will not make any changes to her medications and will continue to monitor her status. Will check hgb/hct hgb A1c bmp    Synthia Innocent NP [place] [facility]  call [phone]

## 2023-10-19 ENCOUNTER — Encounter: Payer: Self-pay | Admitting: Adult Health

## 2023-10-19 ENCOUNTER — Other Ambulatory Visit (HOSPITAL_COMMUNITY)
Admission: RE | Admit: 2023-10-19 | Discharge: 2023-10-19 | Disposition: A | Discharge status: Home / Self Care | Payer: Medicare Other | Source: Skilled Nursing Facility | Attending: Adult Health | Admitting: Adult Health

## 2023-10-19 DIAGNOSIS — E1169 Type 2 diabetes mellitus with other specified complication: Secondary | ICD-10-CM | POA: Insufficient documentation

## 2023-10-19 LAB — BASIC METABOLIC PANEL
Anion gap: 13 (ref 5–15)
BUN: 22 mg/dL (ref 8–23)
CO2: 22 mmol/L (ref 22–32)
Calcium: 8.9 mg/dL (ref 8.9–10.3)
Chloride: 92 mmol/L — ABNORMAL LOW (ref 98–111)
Creatinine, Ser: 0.95 mg/dL (ref 0.44–1.00)
GFR, Estimated: >60 mL/min (ref >60)
Glucose, Bld: 127 mg/dL — ABNORMAL HIGH (ref 70–99)
Potassium: 4.1 mmol/L (ref 3.5–5.1)
Sodium: 127 mmol/L — ABNORMAL LOW (ref 135–145)

## 2023-10-19 LAB — HEMOGLOBIN A1C
Hgb A1c MFr Bld: 6.5 % — ABNORMAL HIGH (ref 4.8–5.6)
Mean Plasma Glucose: 139.85 mg/dL

## 2023-10-19 LAB — HEMOGLOBIN AND HEMATOCRIT, BLOOD
HCT: 34.9 % — ABNORMAL LOW (ref 36.0–46.0)
Hemoglobin: 10.7 g/dL — ABNORMAL LOW (ref 12.0–15.0)

## 2023-10-19 NOTE — Progress Notes (Signed)
Location:  [facility] Room Number: 115 Place of Service:  SNF (31)   CODE STATUS:   Allergies  Allergen Reactions  . Gabapentin   . Lisinopril   . Penicillins Nausea Only    Has patient had a PCN reaction causing immediate rash, facial/tongue/throat swelling, SOB or lightheadedness with hypotension: Yes Has patient had a PCN reaction causing severe rash involving mucus membranes or skin necrosis: No Has patient had a PCN reaction that required hospitalization No Has patient had a PCN reaction occurring within the last 10 years: No If all of the above answers are "NO", then may proceed with Cephalosporin use.   . Sulfa Antibiotics Nausea And Vomiting    Chief Complaint  Patient presents with  . Acute Visit    Follow up labs     HPI:    Past Medical History:  Diagnosis Date  . Anxiety   . Bowel obstruction (HCC)   . Burning with urination 01/04/2016  . Diabetes mellitus with neuropathy   . Genital herpes   . GERD (gastroesophageal reflux disease)   . Hematuria 01/04/2016  . Herpes 12/22/2015  . Hyperlipidemia   . Hypertension   . LLQ abdominal tenderness 01/04/2016  . Neuropathy   . Pelvic pressure in female 01/04/2016  . Recurrent UTI   . Sciatic nerve disease   . Small bowel obstruction (HCC) 04/27/2016  . Urinary frequency 01/04/2016  . Urticaria     Past Surgical History:  Procedure Laterality Date  . ABDOMINAL HYSTERECTOMY    . APPLICATION OF INTRAOPERATIVE CT SCAN N/A 12/19/2018   Procedure: APPLICATION OF INTRAOPERATIVE CT SCAN;  Surgeon: Maeola Harman, MD;  Location: [facility] OR;  Service: Neurosurgery;  Laterality: N/A;  . bowel obstruction    . COLON SURGERY     blocked colon  . POSTERIOR CERVICAL FUSION/FORAMINOTOMY N/A 12/19/2018   Procedure: Cervical Seven to Thoracic One Posterior cervical fusion;  Surgeon: Maeola Harman, MD;  Location: [facility] OR;  Service: Neurosurgery;  Laterality: N/A;  Cervical Seven to Thoracic One Posterior  cervical fusion    Social History   Socioeconomic History  . Marital status: Widowed    Spouse name: Not on file  . Number of children: Not on file  . Years of education: Not on file  . Highest education level: Not on file  Occupational History  . Occupation: retired   Tobacco Use  . Smoking status: Never  . Smokeless tobacco: Current    Types: Snuff  Vaping Use  . Vaping status: Never Used  Substance and Sexual Activity  . Alcohol use: No  . Drug use: No  . Sexual activity: Not Currently    Birth control/protection: Surgical    Comment: hyst  Other Topics Concern  . Not on file  Social History Narrative   Long term resident of [facility]    Social Determinants of Health   Financial Resource Strain: Not on file  Food Insecurity: Not on file  Transportation Needs: Not on file  Physical Activity: Not on file  Stress: Not on file  Social Connections: Not on file  Intimate Partner Violence: Not on file   Family History  Problem Relation Age of Onset  . Diabetes Mother   . Heart disease Brother   . Hypertension Brother   . Diabetes Brother   . Hypertension Daughter   . Diabetes Brother   . Diabetes Brother   . Alzheimer's disease Brother   . Anxiety disorder Daughter  VITAL SIGNS BP 138/72   Pulse 80   Temp 97.6 F (36.4 C)   Resp 16   Ht 5' (1.524 m)   Wt 194 lb 6.4 oz (88.2 kg)   SpO2 98%   BMI 37.97 kg/m   Outpatient Encounter Medications as of 10/19/2023  Medication Sig  . acetaminophen (TYLENOL) 650 MG CR tablet Take 650 mg by mouth 3 (three) times daily.  Marland Kitchen amLODipine (NORVASC) 10 MG tablet Take 1 tablet (10 mg total) by mouth daily.  Marland Kitchen aspirin 81 MG chewable tablet Chew 81 mg by mouth daily.  . busPIRone (BUSPAR) 5 MG tablet Take 5 mg by mouth 3 (three) times daily.  . carvedilol (COREG) 6.25 MG tablet Take 6.25 mg by mouth 2 (two) times daily with a meal.  . Cranberry 500 MG CAPS Take by mouth in the morning and at bedtime. For UTI prevention   . diclofenac Sodium (VOLTAREN) 1 % GEL Apply topically 3 (three) times daily as needed.  . DULoxetine (CYMBALTA) 60 MG capsule Take 60 mg by mouth daily. Pain management  . Insulin Glargine (BASAGLAR KWIKPEN) 100 UNIT/ML Inject 10 Units into the skin at bedtime.  . Insulin Pen Needle (BD AUTOSHIELD DUO) 30G X 5 MM MISC by Does not apply route. 3/16"  . lamoTRIgine (LAMICTAL) 25 MG tablet Take 25 mg by mouth daily.  Marland Kitchen linagliptin (TRADJENTA) 5 MG TABS tablet Take 5 mg by mouth daily.  Marland Kitchen loratadine (CLARITIN) 10 MG tablet Take 10 mg by mouth daily.  Marland Kitchen losartan (COZAAR) 25 MG tablet Take 25 mg by mouth daily.  . Melatonin 5 MG TABS Take 5 mg by mouth at bedtime.  . meloxicam (MOBIC) 15 MG tablet Take 15 mg by mouth daily. Hip Pain  . metFORMIN (GLUCOPHAGE) 1000 MG tablet Take 1,000 mg by mouth 2 (two) times daily with a meal.   . mirabegron ER (MYRBETRIQ) 50 MG TB24 tablet Take 50 mg by mouth daily.  . montelukast (SINGULAIR) 10 MG tablet Take 10 mg by mouth daily.  . mupirocin ointment (BACTROBAN) 2 % 1 Application 2 (two) times daily.  . NON FORMULARY Diet: Regular diet  . omeprazole (PRILOSEC) 20 MG capsule Take 20 mg by mouth daily. Special Instructions:  *TAKE ON AN EMPTY STOMACH* *DO NOT CRUSH*  . polyethylene glycol (MIRALAX / GLYCOLAX) packet Take 17 g by mouth daily.  . pregabalin (LYRICA) 25 MG capsule Take 1 capsule (25 mg total) by mouth in the morning and at bedtime.  . senna-docusate (SENOKOT-S) 8.6-50 MG tablet Take 1 tablet by mouth at bedtime.   . sertraline (ZOLOFT) 50 MG tablet Take 50 mg by mouth daily.  . simvastatin (ZOCOR) 10 MG tablet Take 10 mg by mouth at bedtime.   Marland Kitchen spironolactone (ALDACTONE) 25 MG tablet Take 25 mg by mouth daily.  Marland Kitchen triamcinolone (NASACORT) 55 MCG/ACT AERO nasal inhaler Place 1 spray into the nose daily.  . valACYclovir (VALTREX) 500 MG tablet Take 500 mg by mouth daily.    No facility-administered encounter medications on file as of 10/19/2023.      SIGNIFICANT DIAGNOSTIC EXAMS       ASSESSMENT/ PLAN:     Synthia Innocent NP [facility]   call [phone]   This encounter was created in error - please disregard.

## 2023-10-23 DIAGNOSIS — E669 Obesity, unspecified: Secondary | ICD-10-CM | POA: Insufficient documentation

## 2023-10-25 ENCOUNTER — Non-Acute Institutional Stay (SKILLED_NURSING_FACILITY): Payer: Medicare Other | Admitting: Internal Medicine

## 2023-10-25 ENCOUNTER — Encounter: Payer: Self-pay | Admitting: Internal Medicine

## 2023-10-25 DIAGNOSIS — R600 Localized edema: Secondary | ICD-10-CM

## 2023-10-25 DIAGNOSIS — I10 Essential (primary) hypertension: Secondary | ICD-10-CM

## 2023-10-25 DIAGNOSIS — E1151 Type 2 diabetes mellitus with diabetic peripheral angiopathy without gangrene: Secondary | ICD-10-CM

## 2023-10-25 DIAGNOSIS — F339 Major depressive disorder, recurrent, unspecified: Secondary | ICD-10-CM

## 2023-10-25 NOTE — Assessment & Plan Note (Signed)
There is dramatic nonpitting edema of the right lower suggesting lymphedema in the context of history of multiple abdominal procedures and surgeries.  I explained that wrapping &/or elevation are most appropriate options.  She indicates she will try to keep the leg elevated as much as possible. Albumin & total protein were low normal in January ; these will be updated.

## 2023-10-25 NOTE — Progress Notes (Unsigned)
NURSING HOME LOCATION:  [facility] ROOM NUMBER:  [id number]  CODE STATUS: Full Code  PCP:  Synthia Innocent NP  This is a nursing facility follow up visit of chronic medical diagnoses & to document compliance with Regulation 483.30 (c) in The Long Term Care Survey Manual Phase 2 which mandates caregiver visit ( visits can alternate among physician, PA or NP as per statutes) within 10 days of 30 days / 60 days/ 90 days post admission to SNF date    Interim medical record and care since last SNF visit was updated with review of diagnostic studies and change in clinical status since last visit were documented.  HPI: She is a permanent resident of this facility with remote history of small bowel obstruction, diabetes with peripheral neuropathy, GERD, dyslipidemia, essential hypertension, and sciatica.  Labs are current and reveal slight progression of hyponatremia with a current sodium of 127. CKD is stable to improved with current creatinine of 0.95 and GFR greater than 60.  A1c has improved from 6.8 down to 6.5% indicating prediabetic values.  This is on linagliptin 5 mg daily, metformin 1000 mg twice daily, and basal insulin 10 units daily. Anemia is stable to improved with H/H of 10.7/34.9.  Review of systems: She states that the cough she has had since she had pneumonia 3 months ago has improved.  She describes neuropathy with swelling in the right lower extremity which is chronic and unchanged as well as sciatica in the left lower extremity.  She recently had dysuria which resolved with increased fluid intake.  Constitutional: No fever, significant weight change, fatigue  Eyes: No redness, discharge, pain, vision change ENT/mouth: No nasal congestion,  purulent discharge, earache, change in hearing, sore throat  Cardiovascular: No chest pain, palpitations, paroxysmal nocturnal dyspnea, claudication  Respiratory: No sputum production, hemoptysis, DOE, significant snoring,  apnea   Gastrointestinal: No heartburn, dysphagia, abdominal pain, nausea /vomiting, rectal bleeding, melena, change in bowels Genitourinary: No hematuria, pyuria, incontinence, nocturia Musculoskeletal: No joint stiffness, joint swelling, weakness, pain Dermatologic: No rash, pruritus, change in appearance of skin Neurologic: No dizziness, headache, syncope, seizures Psychiatric: No significant anxiety, depression, insomnia, anorexia Endocrine: No change in hair/skin/nails, excessive thirst, excessive hunger, excessive urination  Hematologic/lymphatic: No significant bruising, lymphadenopathy, abnormal bleeding Allergy/immunology: No itchy/watery eyes, significant sneezing, urticaria, angioedema  Physical exam:  Pertinent or positive findings: She is markedly hard of hearing.  She exhibits hyponasal speech.  Eyebrows are decreased in density.  She is edentulous except for 3 lower teeth.  She has a grade 1/2 systolic murmur.  Slight increase in second heart sound.  Central obesity is present.  Dorsalis pedis pulses are stronger than PTP pulses.  There is nonpitting massive edema of the right lower extremity.  The pulses in the right lower extremity are not palpable.  She has scarring over the left shin.  There is marked fusiform enlargement of the right knee.  General appearance: Adequately nourished; no acute distress, increased work of breathing is present.   Lymphatic: No lymphadenopathy about the head, neck, axilla. Eyes: No conjunctival inflammation or lid edema is present. There is no scleral icterus. Ears:  External ear exam shows no significant lesions or deformities.   Nose:  External nasal examination shows no deformity or inflammation. Nasal mucosa are pink and moist without lesions, exudates Neck:  No thyromegaly, masses, tenderness noted.    Heart:  Normal rate and regular rhythm. S1 normal . No gallop,  click, rub .  Lungs: Chest clear to auscultation without wheezes, rhonchi,  rales, rubs. Abdomen: Bowel sounds are normal. Abdomen is soft and nontender with no organomegaly, hernias, masses. GU: Deferred  Extremities:  No cyanosis, clubbing  Neurologic exam :Balance, Rhomberg, finger to nose testing could not be completed due to clinical state Skin: Warm & dry w/o tenting. No significant lesions or rash.  See summary under each active problem in the Problem List with associated updated therapeutic plan

## 2023-10-25 NOTE — Patient Instructions (Signed)
See assessment and plan under each diagnosis in the problem list and acutely for this visit 

## 2023-10-25 NOTE — Assessment & Plan Note (Signed)
Clinically depression is in remission as she is interactive and exhibits self-deprecating humor.

## 2023-10-25 NOTE — Assessment & Plan Note (Signed)
Current A1c is 6.5%.  Metformin will be discontinued and glucoses monitored with repeat A1c in 4 months.

## 2023-10-25 NOTE — Assessment & Plan Note (Signed)
There is marked variability in the blood pressure with intermittent mild systolic hypertension as well as his.  Average will be verified and the calcium channel blocker dosage adjusted as indicated.

## 2023-11-08 ENCOUNTER — Other Ambulatory Visit: Payer: Self-pay | Admitting: Adult Health

## 2023-11-08 MED ORDER — PREGABALIN 25 MG PO CAPS: 25.0000 mg | ORAL_CAPSULE | Freq: Two times a day (BID) | ORAL | 0 refills | Status: DC

## 2023-11-14 ENCOUNTER — Non-Acute Institutional Stay (SKILLED_NURSING_FACILITY): Payer: Medicare Other | Admitting: Adult Health

## 2023-11-14 ENCOUNTER — Encounter: Payer: Self-pay | Admitting: Adult Health

## 2023-11-14 DIAGNOSIS — M5416 Radiculopathy, lumbar region: Secondary | ICD-10-CM

## 2023-11-14 DIAGNOSIS — I152 Hypertension secondary to endocrine disorders: Secondary | ICD-10-CM

## 2023-11-14 DIAGNOSIS — E1159 Type 2 diabetes mellitus with other circulatory complications: Secondary | ICD-10-CM | POA: Diagnosis not present

## 2023-11-14 NOTE — Progress Notes (Unsigned)
Location:  [facility] Room Number: 115 Place of Service:  SNF (31)   CODE STATUS: full   Allergies  Allergen Reactions   Gabapentin    Lisinopril    Penicillins Nausea Only    Has patient had a PCN reaction causing immediate rash, facial/tongue/throat swelling, SOB or lightheadedness with hypotension: Yes Has patient had a PCN reaction causing severe rash involving mucus membranes or skin necrosis: No Has patient had a PCN reaction that required hospitalization No Has patient had a PCN reaction occurring within the last 10 years: No If all of the above answers are "NO", then may proceed with Cephalosporin use.    Sulfa Antibiotics Nausea And Vomiting    Chief Complaint  Patient presents with   Acute Visit    Pain management     HPI:  She has a history of sciatic left leg pain with flares. She states that she is having a flare. She is having lower back pain radiating down her left leg. She states that it is more difficult to get out of bed and is having increased insomnia.  Her blood pressure readings remain elevated.   Past Medical History:  Diagnosis Date   Anxiety    Bowel obstruction (HCC)    Burning with urination 01/04/2016   Diabetes mellitus with neuropathy    Genital herpes    GERD (gastroesophageal reflux disease)    Hematuria 01/04/2016   Herpes 12/22/2015   Hyperlipidemia    Hypertension    LLQ abdominal tenderness 01/04/2016   Neuropathy    Pelvic pressure in female 01/04/2016   Recurrent UTI    Sciatic nerve disease    Small bowel obstruction (HCC) 04/27/2016   Urinary frequency 01/04/2016   Urticaria     Past Surgical History:  Procedure Laterality Date   ABDOMINAL HYSTERECTOMY     APPLICATION OF INTRAOPERATIVE CT SCAN N/A 12/19/2018   Procedure: APPLICATION OF INTRAOPERATIVE CT SCAN;  Surgeon: Maeola Harman, MD;  Location: [facility] OR;  Service: Neurosurgery;  Laterality: N/A;   bowel obstruction     COLON SURGERY      blocked colon   POSTERIOR CERVICAL FUSION/FORAMINOTOMY N/A 12/19/2018   Procedure: Cervical Seven to Thoracic One Posterior cervical fusion;  Surgeon: Maeola Harman, MD;  Location: [facility] OR;  Service: Neurosurgery;  Laterality: N/A;  Cervical Seven to Thoracic One Posterior cervical fusion    Social History   Socioeconomic History   Marital status: Widowed    Spouse name: Not on file   Number of children: Not on file   Years of education: Not on file   Highest education level: Not on file  Occupational History   Occupation: retired   Tobacco Use   Smoking status: Never   Smokeless tobacco: Current    Types: Snuff  Vaping Use   Vaping status: Never Used  Substance and Sexual Activity   Alcohol use: No   Drug use: No   Sexual activity: Not Currently    Birth control/protection: Surgical    Comment: hyst  Other Topics Concern   Not on file  Social History Narrative   Long term resident of [facility]    Social Determinants of Health   Financial Resource Strain: Not on file  Food Insecurity: Not on file  Transportation Needs: Not on file  Physical Activity: Not on file  Stress: Not on file  Social Connections: Not on file  Intimate Partner Violence: Not on file   Family History  Problem  Relation Age of Onset   Diabetes Mother    Heart disease Brother    Hypertension Brother    Diabetes Brother    Hypertension Daughter    Diabetes Brother    Diabetes Brother    Alzheimer's disease Brother    Anxiety disorder Daughter       VITAL SIGNS BP (!) 163/70   Pulse 63   Temp (!) 96 F (35.6 C)   Resp 18   Ht 5' (1.524 m)   Wt 194 lb 6.4 oz (88.2 kg)   SpO2 98%   BMI 37.97 kg/m   Outpatient Encounter Medications as of 11/14/2023  Medication Sig   acetaminophen (TYLENOL) 650 MG CR tablet Take 650 mg by mouth 3 (three) times daily.   amLODipine (NORVASC) 10 MG tablet Take 1 tablet (10 mg total) by mouth daily.   aspirin 81 MG chewable tablet Chew 81 mg by mouth daily.    busPIRone (BUSPAR) 5 MG tablet Take 5 mg by mouth 3 (three) times daily.   carvedilol (COREG) 6.25 MG tablet Take 6.25 mg by mouth 2 (two) times daily with a meal.   Cranberry 500 MG CAPS Take by mouth in the morning and at bedtime. For UTI prevention   diclofenac Sodium (VOLTAREN) 1 % GEL Apply topically 3 (three) times daily as needed.   DULoxetine (CYMBALTA) 60 MG capsule Take 60 mg by mouth daily. Pain management   Insulin Glargine (BASAGLAR KWIKPEN) 100 UNIT/ML Inject 10 Units into the skin at bedtime.   Insulin Pen Needle (BD AUTOSHIELD DUO) 30G X 5 MM MISC by Does not apply route. 3/16"   lamoTRIgine (LAMICTAL) 25 MG tablet Take 25 mg by mouth daily.   linagliptin (TRADJENTA) 5 MG TABS tablet Take 5 mg by mouth daily.   loratadine (CLARITIN) 10 MG tablet Take 10 mg by mouth daily.   losartan (COZAAR) 25 MG tablet Take 25 mg by mouth daily.   Melatonin 5 MG TABS Take 5 mg by mouth at bedtime.   meloxicam (MOBIC) 15 MG tablet Take 15 mg by mouth daily. Hip Pain   metFORMIN (GLUCOPHAGE) 1000 MG tablet Take 1,000 mg by mouth 2 (two) times daily with a meal.    mirabegron ER (MYRBETRIQ) 50 MG TB24 tablet Take 50 mg by mouth daily.   montelukast (SINGULAIR) 10 MG tablet Take 10 mg by mouth daily.   mupirocin ointment (BACTROBAN) 2 % 1 Application 2 (two) times daily.   NON FORMULARY Diet: Regular diet   omeprazole (PRILOSEC) 20 MG capsule Take 20 mg by mouth daily. Special Instructions:  *TAKE ON AN EMPTY STOMACH* *DO NOT CRUSH*   polyethylene glycol (MIRALAX / GLYCOLAX) packet Take 17 g by mouth daily.   pregabalin (LYRICA) 25 MG capsule Take 1 capsule (25 mg total) by mouth in the morning and at bedtime.   senna-docusate (SENOKOT-S) 8.6-50 MG tablet Take 1 tablet by mouth at bedtime.    sertraline (ZOLOFT) 50 MG tablet Take 50 mg by mouth daily.   simvastatin (ZOCOR) 10 MG tablet Take 10 mg by mouth at bedtime.    spironolactone (ALDACTONE) 25 MG tablet Take 25 mg by mouth daily.    triamcinolone (NASACORT) 55 MCG/ACT AERO nasal inhaler Place 1 spray into the nose daily.   valACYclovir (VALTREX) 500 MG tablet Take 500 mg by mouth daily.    No facility-administered encounter medications on file as of 11/14/2023.     SIGNIFICANT DIAGNOSTIC EXAMS   PREVIOUS   07-27-22: dexa t  score: 0.636  NO NEW EXAMS    LABS REVIEWED PREVIOUS;     12-29-22: wbc 10.0; hgb 10.8; hct 35.2 mcv 82.6 plt 212; glucose 131; bun 21; creat 0.95; k+ 3.6; na++ 136; ca 8.6; gfr >60; protein 6.6 albumin 3.6; hgb A1c 7.2; chol 132; ldl 50; trig 134; hdl 55 01-26-23: d-dimer: 0.46 03-02-23: glucose 133; bun 23; creat 0.83; k+ 3.5; na++ 134; ca 9.1 gfr >60 03-23-23: tsh 5.410; vitamin B 12: 806   04-05-23: wbc 19.9; hgb 11.1; hct 34.6; mcv 78.8 plt 306; glucose 137; bun 48; creat 1.11; k+ 3.0; na++ 133; ca 9.0; gfr 50  04-06-23; wbc 19.8; hgb 11.1; hct 35.3; mcv 81.0 plt 312; glucose 95; bun 40; creat 1.21; k+ 4.0; na++ 137; ca 9.2; gfr 45  04-10-23: wbc 14.6; hgb 10.7; hct 34.2; mcv 79.4 plt 236; glucose 106; bun 21; creat 0.84; k+ 3.2; na++ 132; ca 8.7 gfr >60 04-13-23: wbc 10.6; hgb 10.0; hct 32.3; mcv 82.2 plt 219; glucose 96; bun 22; creat 0.92; k+ 3.0; na++ 136; ca 8.5; gfr >60 04-17-23: k+ 4.1 06-01-23: urine microalbumin 7.8 06-22-23: glucose 110; bun 28; creat 0.97; k+ 4.1; na++ 130; ca 8.8; gfr 59 hgb A1c 6.8  NO NEW LABS.     Review of Systems  Constitutional:  Negative for malaise/fatigue.  Respiratory:  Negative for cough and shortness of breath.   Cardiovascular:  Negative for chest pain, palpitations and leg swelling.  Gastrointestinal:  Negative for abdominal pain, constipation and heartburn.  Musculoskeletal:  Positive for back pain and myalgias. Negative for joint pain.  Skin: Negative.   Neurological:  Negative for dizziness.  Psychiatric/Behavioral:  The patient is not nervous/anxious.     Physical Exam Constitutional:      General: She is not in acute distress.    Appearance:  She is well-developed. She is obese. She is not diaphoretic.  Neck:     Thyroid: No thyromegaly.  Cardiovascular:     Rate and Rhythm: Normal rate and regular rhythm.     Pulses: Normal pulses.     Heart sounds: Normal heart sounds.  Pulmonary:     Effort: Pulmonary effort is normal. No respiratory distress.     Breath sounds: Normal breath sounds.  Abdominal:     General: Bowel sounds are normal. There is no distension.     Palpations: Abdomen is soft.     Tenderness: There is no abdominal tenderness.  Musculoskeletal:        General: Normal range of motion.     Cervical back: Neck supple.     Right lower leg: No edema.     Left lower leg: No edema.  Lymphadenopathy:     Cervical: No cervical adenopathy.  Skin:    General: Skin is warm and dry.  Neurological:     Mental Status: She is alert. Mental status is at baseline.     Comments: 04-12-23: SLUMS 6/30  Psychiatric:        Mood and Affect: Mood normal.       ASSESSMENT/ PLAN:  TODAY  Lumbar radiculopathy: will increase lyrica to 25 mg three times daily; will begin prednisone 40 mg daily through 11-19-23  2. Hypertension associated with type 2 diabetes mellitus: will increase cozaar to 50 mg daily and will check bmp on 11-27-23    Synthia Innocent NP [facility]  call [phone]

## 2023-11-24 ENCOUNTER — Non-Acute Institutional Stay (SKILLED_NURSING_FACILITY): Payer: Self-pay | Admitting: Adult Health

## 2023-11-24 ENCOUNTER — Encounter: Payer: Self-pay | Admitting: Adult Health

## 2023-11-24 DIAGNOSIS — E1151 Type 2 diabetes mellitus with diabetic peripheral angiopathy without gangrene: Secondary | ICD-10-CM

## 2023-11-24 DIAGNOSIS — I700 Atherosclerosis of aorta: Secondary | ICD-10-CM

## 2023-11-24 DIAGNOSIS — F0390 Unspecified dementia without behavioral disturbance: Secondary | ICD-10-CM | POA: Diagnosis not present

## 2023-11-24 NOTE — Progress Notes (Signed)
Location:  [facility] Room Number: 115 Place of Service:  SNF (31)   CODE STATUS: full   Allergies  Allergen Reactions   Gabapentin    Lisinopril    Penicillins Nausea Only    Has patient had a PCN reaction causing immediate rash, facial/tongue/throat swelling, SOB or lightheadedness with hypotension: Yes Has patient had a PCN reaction causing severe rash involving mucus membranes or skin necrosis: No Has patient had a PCN reaction that required hospitalization No Has patient had a PCN reaction occurring within the last 10 years: No If all of the above answers are "NO", then may proceed with Cephalosporin use.    Sulfa Antibiotics Nausea And Vomiting    Chief Complaint  Patient presents with   Acute Visit    Care plan meeting     HPI:  We have come together for her care plan  meeting. BIMS 14/15 mood 2/30: not sleeping well; decreased energy. She is ambulatory with one fall without injury. She requires independent to supervision with her adls. She is occasionally incontinent of bladder and frequently incontinent of bowel. Dietary: regular diet weight is 193 pounds feeds self; appetite 76-100%.  Therapy: none at this time. Activities: does participate. 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   Major neurocognitive disorder  Past Medical History:  Diagnosis Date   Anxiety    Bowel obstruction (HCC)    Burning with urination 01/04/2016   Diabetes mellitus with neuropathy    Genital herpes    GERD (gastroesophageal reflux disease)    Hematuria 01/04/2016   Herpes 12/22/2015   Hyperlipidemia    Hypertension    LLQ abdominal tenderness 01/04/2016   Neuropathy    Pelvic pressure in female 01/04/2016   Recurrent UTI    Sciatic nerve disease    Small bowel obstruction (HCC) 04/27/2016   Urinary frequency 01/04/2016   Urticaria     Past Surgical  History:  Procedure Laterality Date   ABDOMINAL HYSTERECTOMY     APPLICATION OF INTRAOPERATIVE CT SCAN N/A 12/19/2018   Procedure: APPLICATION OF INTRAOPERATIVE CT SCAN;  Surgeon: Maeola Harman, MD;  Location: [facility] OR;  Service: Neurosurgery;  Laterality: N/A;   bowel obstruction     COLON SURGERY     blocked colon   POSTERIOR CERVICAL FUSION/FORAMINOTOMY N/A 12/19/2018   Procedure: Cervical Seven to Thoracic One Posterior cervical fusion;  Surgeon: Maeola Harman, MD;  Location: [facility] OR;  Service: Neurosurgery;  Laterality: N/A;  Cervical Seven to Thoracic One Posterior cervical fusion    Social History   Socioeconomic History   Marital status: Widowed    Spouse name: Not on file   Number of children: Not on file   Years of education: Not on file   Highest education level: Not on file  Occupational History   Occupation: retired   Tobacco Use   Smoking status: Never   Smokeless tobacco: Current    Types: Snuff  Vaping Use   Vaping status: Never Used  Substance and Sexual Activity   Alcohol use: No   Drug use: No   Sexual activity: Not Currently    Birth control/protection: Surgical    Comment: hyst  Other Topics Concern   Not on file  Social History Narrative   Long term resident of [facility]    Social Drivers of Health   Financial Resource Strain: Not on file  Food  Insecurity: Not on file  Transportation Needs: Not on file  Physical Activity: Not on file  Stress: Not on file  Social Connections: Not on file  Intimate Partner Violence: Not on file   Family History  Problem Relation Age of Onset   Diabetes Mother    Heart disease Brother    Hypertension Brother    Diabetes Brother    Hypertension Daughter    Diabetes Brother    Diabetes Brother    Alzheimer's disease Brother    Anxiety disorder Daughter       VITAL SIGNS BP 128/69   Pulse 88   Temp 97.7 F (36.5 C)   Resp 20   Ht 5\' 1"  (1.549 m)   Wt 193 lb 12.8 oz (87.9 kg)   SpO2 98%   BMI 36.62 kg/m    Outpatient Encounter Medications as of 11/24/2023  Medication Sig   acetaminophen (TYLENOL) 650 MG CR tablet Take 650 mg by mouth 3 (three) times daily.   amLODipine (NORVASC) 10 MG tablet Take 1 tablet (10 mg total) by mouth daily.   aspirin 81 MG chewable tablet Chew 81 mg by mouth daily.   busPIRone (BUSPAR) 5 MG tablet Take 5 mg by mouth 3 (three) times daily.   carvedilol (COREG) 6.25 MG tablet Take 6.25 mg by mouth 2 (two) times daily with a meal.   Cranberry 500 MG CAPS Take by mouth in the morning and at bedtime. For UTI prevention   diclofenac Sodium (VOLTAREN) 1 % GEL Apply topically 3 (three) times daily as needed.   DULoxetine (CYMBALTA) 60 MG capsule Take 60 mg by mouth daily. Pain management   Insulin Glargine (BASAGLAR KWIKPEN) 100 UNIT/ML Inject 10 Units into the skin at bedtime.   Insulin Pen Needle (BD AUTOSHIELD DUO) 30G X 5 MM MISC by Does not apply route. 3/16"   lamoTRIgine (LAMICTAL) 25 MG tablet Take 25 mg by mouth daily.   linagliptin (TRADJENTA) 5 MG TABS tablet Take 5 mg by mouth daily.   loratadine (CLARITIN) 10 MG tablet Take 10 mg by mouth daily.   losartan (COZAAR) 50 MG tablet Take 50 mg by mouth daily.   Melatonin 5 MG TABS Take 5 mg by mouth at bedtime.   meloxicam (MOBIC) 15 MG tablet Take 15 mg by mouth daily. Hip Pain   metFORMIN (GLUCOPHAGE) 1000 MG tablet Take 1,000 mg by mouth 2 (two) times daily with a meal.    mirabegron ER (MYRBETRIQ) 50 MG TB24 tablet Take 50 mg by mouth daily.   montelukast (SINGULAIR) 10 MG tablet Take 10 mg by mouth daily.   mupirocin ointment (BACTROBAN) 2 % 1 Application 2 (two) times daily.   NON FORMULARY Diet: Regular diet   omeprazole (PRILOSEC) 20 MG capsule Take 20 mg by mouth daily. Special Instructions:  *TAKE ON AN EMPTY STOMACH* *DO NOT CRUSH*   polyethylene glycol (MIRALAX / GLYCOLAX) packet Take 17 g by mouth daily.   pregabalin (LYRICA) 25 MG capsule Take 25 mg by mouth 3 (three) times daily.   senna-docusate  (SENOKOT-S) 8.6-50 MG tablet Take 1 tablet by mouth at bedtime.    sertraline (ZOLOFT) 50 MG tablet Take 50 mg by mouth daily.   simvastatin (ZOCOR) 10 MG tablet Take 10 mg by mouth at bedtime.    spironolactone (ALDACTONE) 25 MG tablet Take 25 mg by mouth daily.   triamcinolone (NASACORT) 55 MCG/ACT AERO nasal inhaler Place 1 spray into the nose daily.   valACYclovir (VALTREX) 500 MG  tablet Take 500 mg by mouth daily.    No facility-administered encounter medications on file as of 11/24/2023.     SIGNIFICANT DIAGNOSTIC EXAMS  PREVIOUS   07-27-22: dexa t score: 0.636  NO NEW EXAMS    LABS REVIEWED PREVIOUS;     12-29-22: wbc 10.0; hgb 10.8; hct 35.2 mcv 82.6 plt 212; glucose 131; bun 21; creat 0.95; k+ 3.6; na++ 136; ca 8.6; gfr >60; protein 6.6 albumin 3.6; hgb A1c 7.2; chol 132; ldl 50; trig 134; hdl 55 01-26-23: d-dimer: 0.46 03-02-23: glucose 133; bun 23; creat 0.83; k+ 3.5; na++ 134; ca 9.1 gfr >60 03-23-23: tsh 5.410; vitamin B 12: 806   04-05-23: wbc 19.9; hgb 11.1; hct 34.6; mcv 78.8 plt 306; glucose 137; bun 48; creat 1.11; k+ 3.0; na++ 133; ca 9.0; gfr 50  04-06-23; wbc 19.8; hgb 11.1; hct 35.3; mcv 81.0 plt 312; glucose 95; bun 40; creat 1.21; k+ 4.0; na++ 137; ca 9.2; gfr 45  04-10-23: wbc 14.6; hgb 10.7; hct 34.2; mcv 79.4 plt 236; glucose 106; bun 21; creat 0.84; k+ 3.2; na++ 132; ca 8.7 gfr >60 04-13-23: wbc 10.6; hgb 10.0; hct 32.3; mcv 82.2 plt 219; glucose 96; bun 22; creat 0.92; k+ 3.0; na++ 136; ca 8.5; gfr >60 04-17-23: k+ 4.1 06-01-23: urine microalbumin 7.8 06-22-23: glucose 110; bun 28; creat 0.97; k+ 4.1; na++ 130; ca 8.8; gfr 59 hgb A1c 6.8  NO NEW LABS.    Review of Systems  Constitutional:  Negative for malaise/fatigue.  Respiratory:  Negative for cough and shortness of breath.   Cardiovascular:  Negative for chest pain, palpitations and leg swelling.  Gastrointestinal:  Negative for abdominal pain, constipation and heartburn.  Musculoskeletal:  Negative for back  pain, joint pain and myalgias.  Skin: Negative.   Neurological:  Negative for dizziness.  Psychiatric/Behavioral:  The patient is not nervous/anxious.    Physical Exam Constitutional:      General: She is not in acute distress.    Appearance: She is well-developed. She is obese. She is not diaphoretic.  Neck:     Thyroid: No thyromegaly.  Cardiovascular:     Rate and Rhythm: Normal rate and regular rhythm.     Pulses: Normal pulses.     Heart sounds: Normal heart sounds.  Pulmonary:     Effort: Pulmonary effort is normal. No respiratory distress.     Breath sounds: Normal breath sounds.  Abdominal:     General: Bowel sounds are normal. There is no distension.     Palpations: Abdomen is soft.     Tenderness: There is no abdominal tenderness.  Musculoskeletal:        General: Normal range of motion.     Cervical back: Neck supple.     Right lower leg: No edema.     Left lower leg: No edema.  Lymphadenopathy:     Cervical: No cervical adenopathy.  Skin:    General: Skin is warm and dry.  Neurological:     Mental Status: She is alert. Mental status is at baseline.     Comments: 04-12-23: SLUMS 6/30   Psychiatric:        Mood and Affect: Mood normal.      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   Time spent with patient: 40 minutes: medications; pain management; activities.  Synthia Innocent NP [facility]   call [phone]

## 2023-11-27 ENCOUNTER — Other Ambulatory Visit (HOSPITAL_COMMUNITY)
Admission: RE | Admit: 2023-11-27 | Discharge: 2023-11-27 | Disposition: A | Discharge status: Home / Self Care | Payer: Medicare Other | Source: Skilled Nursing Facility | Attending: Adult Health | Admitting: Adult Health

## 2023-11-27 DIAGNOSIS — H0012 Chalazion right lower eyelid: Secondary | ICD-10-CM | POA: Diagnosis not present

## 2023-11-27 DIAGNOSIS — H524 Presbyopia: Secondary | ICD-10-CM | POA: Diagnosis not present

## 2023-11-27 DIAGNOSIS — I152 Hypertension secondary to endocrine disorders: Secondary | ICD-10-CM | POA: Diagnosis not present

## 2023-11-27 DIAGNOSIS — Z961 Presence of intraocular lens: Secondary | ICD-10-CM | POA: Diagnosis not present

## 2023-11-27 DIAGNOSIS — E119 Type 2 diabetes mellitus without complications: Secondary | ICD-10-CM | POA: Diagnosis not present

## 2023-11-27 LAB — BASIC METABOLIC PANEL
Anion gap: 11 (ref 5–15)
BUN: 16 mg/dL (ref 8–23)
CO2: 24 mmol/L (ref 22–32)
Calcium: 8.6 mg/dL — ABNORMAL LOW (ref 8.9–10.3)
Chloride: 100 mmol/L (ref 98–111)
Creatinine, Ser: 0.93 mg/dL (ref 0.44–1.00)
GFR, Estimated: >60 mL/min (ref >60)
Glucose, Bld: 172 mg/dL — ABNORMAL HIGH (ref 70–99)
Potassium: 3.8 mmol/L (ref 3.5–5.1)
Sodium: 135 mmol/L (ref 135–145)

## 2023-11-29 ENCOUNTER — Non-Acute Institutional Stay (SKILLED_NURSING_FACILITY): Payer: Medicare Other | Admitting: Adult Health

## 2023-11-29 ENCOUNTER — Encounter: Payer: Self-pay | Admitting: Adult Health

## 2023-11-29 DIAGNOSIS — E538 Deficiency of other specified B group vitamins: Secondary | ICD-10-CM | POA: Diagnosis not present

## 2023-11-29 DIAGNOSIS — K5909 Other constipation: Secondary | ICD-10-CM | POA: Diagnosis not present

## 2023-11-29 DIAGNOSIS — F339 Major depressive disorder, recurrent, unspecified: Secondary | ICD-10-CM | POA: Diagnosis not present

## 2023-11-29 DIAGNOSIS — E1159 Type 2 diabetes mellitus with other circulatory complications: Secondary | ICD-10-CM

## 2023-11-29 DIAGNOSIS — I152 Hypertension secondary to endocrine disorders: Secondary | ICD-10-CM

## 2023-11-29 NOTE — Progress Notes (Signed)
Location:  [facility] Room Number: 115 Place of Service:  SNF (31)   CODE STATUS: full  Allergies  Allergen Reactions   Gabapentin    Lisinopril    Penicillins Nausea Only    Has patient had a PCN reaction causing immediate rash, facial/tongue/throat swelling, SOB or lightheadedness with hypotension: Yes Has patient had a PCN reaction causing severe rash involving mucus membranes or skin necrosis: No Has patient had a PCN reaction that required hospitalization No Has patient had a PCN reaction occurring within the last 10 years: No If all of the above answers are "NO", then may proceed with Cephalosporin use.    Sulfa Antibiotics Nausea And Vomiting    Chief Complaint  Patient presents with   Medical Management of Chronic Issues       Vitamin B 12 deficiency:  Chronic constipation:  Hypertension associated with type 2 diabetes . Major depression recurrent chronic     HPI:  She is a 82 year old long term resident of this facility being seen for the management of her chronic illnesses:Vitamin B 12 deficiency:  Chronic constipation:  Hypertension associated with type 2 diabetes . Major depression recurrent chronic. Her pain is presently being managed with her current regimen. She is ambulate with a walker. Her weight is stable.    Past Medical History:  Diagnosis Date   Anxiety    Bowel obstruction (HCC)    Burning with urination 01/04/2016   Diabetes mellitus with neuropathy    Genital herpes    GERD (gastroesophageal reflux disease)    Hematuria 01/04/2016   Herpes 12/22/2015   Hyperlipidemia    Hypertension    LLQ abdominal tenderness 01/04/2016   Neuropathy    Pelvic pressure in female 01/04/2016   Recurrent UTI    Sciatic nerve disease    Small bowel obstruction (HCC) 04/27/2016   Urinary frequency 01/04/2016   Urticaria     Past Surgical History:  Procedure Laterality Date   ABDOMINAL HYSTERECTOMY     APPLICATION OF INTRAOPERATIVE  CT SCAN N/A 12/19/2018   Procedure: APPLICATION OF INTRAOPERATIVE CT SCAN;  Surgeon: Maeola Harman, MD;  Location: [facility] OR;  Service: Neurosurgery;  Laterality: N/A;   bowel obstruction     COLON SURGERY     blocked colon   POSTERIOR CERVICAL FUSION/FORAMINOTOMY N/A 12/19/2018   Procedure: Cervical Seven to Thoracic One Posterior cervical fusion;  Surgeon: Maeola Harman, MD;  Location: [facility] OR;  Service: Neurosurgery;  Laterality: N/A;  Cervical Seven to Thoracic One Posterior cervical fusion    Social History   Socioeconomic History   Marital status: Widowed    Spouse name: Not on file   Number of children: Not on file   Years of education: Not on file   Highest education level: Not on file  Occupational History   Occupation: retired   Tobacco Use   Smoking status: Never   Smokeless tobacco: Current    Types: Snuff  Vaping Use   Vaping status: Never Used  Substance and Sexual Activity   Alcohol use: No   Drug use: No   Sexual activity: Not Currently    Birth control/protection: Surgical    Comment: hyst  Other Topics Concern   Not on file  Social History Narrative   Long term resident of [facility]    Social Drivers of Health   Financial Resource Strain: Not on file  Food Insecurity: Not on file  Transportation Needs: Not on file  Physical  Activity: Not on file  Stress: Not on file  Social Connections: Not on file  Intimate Partner Violence: Not on file   Family History  Problem Relation Age of Onset   Diabetes Mother    Heart disease Brother    Hypertension Brother    Diabetes Brother    Hypertension Daughter    Diabetes Brother    Diabetes Brother    Alzheimer's disease Brother    Anxiety disorder Daughter       VITAL SIGNS BP 136/76   Pulse 74   Temp 98.8 F (37.1 C)   Resp 20   Ht 5\' 1"  (1.549 m)   Wt 193 lb 12.8 oz (87.9 kg)   SpO2 98%   BMI 36.62 kg/m   Outpatient Encounter Medications as of 11/29/2023  Medication Sig   acetaminophen (TYLENOL) 650 MG  CR tablet Take 650 mg by mouth 3 (three) times daily.   amLODipine (NORVASC) 10 MG tablet Take 1 tablet (10 mg total) by mouth daily.   aspirin 81 MG chewable tablet Chew 81 mg by mouth daily.   busPIRone (BUSPAR) 5 MG tablet Take 5 mg by mouth 3 (three) times daily.   carvedilol (COREG) 6.25 MG tablet Take 6.25 mg by mouth 2 (two) times daily with a meal.   Cranberry 500 MG CAPS Take by mouth in the morning and at bedtime. For UTI prevention   diclofenac Sodium (VOLTAREN) 1 % GEL Apply topically 3 (three) times daily as needed.   DULoxetine (CYMBALTA) 60 MG capsule Take 60 mg by mouth daily. Pain management   Insulin Glargine (BASAGLAR KWIKPEN) 100 UNIT/ML Inject 10 Units into the skin at bedtime.   Insulin Pen Needle (BD AUTOSHIELD DUO) 30G X 5 MM MISC by Does not apply route. 3/16"   lamoTRIgine (LAMICTAL) 25 MG tablet Take 25 mg by mouth daily.   linagliptin (TRADJENTA) 5 MG TABS tablet Take 5 mg by mouth daily.   loratadine (CLARITIN) 10 MG tablet Take 10 mg by mouth daily.   losartan (COZAAR) 50 MG tablet Take 50 mg by mouth daily.   Melatonin 5 MG TABS Take 5 mg by mouth at bedtime.   meloxicam (MOBIC) 15 MG tablet Take 15 mg by mouth daily. Hip Pain   metFORMIN (GLUCOPHAGE) 1000 MG tablet Take 1,000 mg by mouth 2 (two) times daily with a meal.    mirabegron ER (MYRBETRIQ) 50 MG TB24 tablet Take 50 mg by mouth daily.   montelukast (SINGULAIR) 10 MG tablet Take 10 mg by mouth daily.   mupirocin ointment (BACTROBAN) 2 % 1 Application 2 (two) times daily.   NON FORMULARY Diet: Regular diet   omeprazole (PRILOSEC) 20 MG capsule Take 20 mg by mouth daily. Special Instructions:  *TAKE ON AN EMPTY STOMACH* *DO NOT CRUSH*   polyethylene glycol (MIRALAX / GLYCOLAX) packet Take 17 g by mouth daily.   pregabalin (LYRICA) 25 MG capsule Take 25 mg by mouth 3 (three) times daily.   senna-docusate (SENOKOT-S) 8.6-50 MG tablet Take 1 tablet by mouth at bedtime.    sertraline (ZOLOFT) 50 MG tablet  Take 50 mg by mouth daily.   simvastatin (ZOCOR) 10 MG tablet Take 10 mg by mouth at bedtime.    spironolactone (ALDACTONE) 25 MG tablet Take 25 mg by mouth daily.   triamcinolone (NASACORT) 55 MCG/ACT AERO nasal inhaler Place 1 spray into the nose daily.   valACYclovir (VALTREX) 500 MG tablet Take 500 mg by mouth daily.    No facility-administered  encounter medications on file as of 11/29/2023.     SIGNIFICANT DIAGNOSTIC EXAMS  PREVIOUS   07-27-22: dexa t score: 0.636  NO NEW EXAMS    LABS REVIEWED PREVIOUS;     12-29-22: wbc 10.0; hgb 10.8; hct 35.2 mcv 82.6 plt 212; glucose 131; bun 21; creat 0.95; k+ 3.6; na++ 136; ca 8.6; gfr >60; protein 6.6 albumin 3.6; hgb A1c 7.2; chol 132; ldl 50; trig 134; hdl 55 01-26-23: d-dimer: 0.46 03-02-23: glucose 133; bun 23; creat 0.83; k+ 3.5; na++ 134; ca 9.1 gfr >60 03-23-23: tsh 5.410; vitamin B 12: 806   04-05-23: wbc 19.9; hgb 11.1; hct 34.6; mcv 78.8 plt 306; glucose 137; bun 48; creat 1.11; k+ 3.0; na++ 133; ca 9.0; gfr 50  04-06-23; wbc 19.8; hgb 11.1; hct 35.3; mcv 81.0 plt 312; glucose 95; bun 40; creat 1.21; k+ 4.0; na++ 137; ca 9.2; gfr 45  04-10-23: wbc 14.6; hgb 10.7; hct 34.2; mcv 79.4 plt 236; glucose 106; bun 21; creat 0.84; k+ 3.2; na++ 132; ca 8.7 gfr >60 04-13-23: wbc 10.6; hgb 10.0; hct 32.3; mcv 82.2 plt 219; glucose 96; bun 22; creat 0.92; k+ 3.0; na++ 136; ca 8.5; gfr >60 04-17-23: k+ 4.1 06-01-23: urine microalbumin 7.8 06-22-23: glucose 110; bun 28; creat 0.97; k+ 4.1; na++ 130; ca 8.8; gfr 59 hgb A1c 6.8  TODAY  11-27-23: glucose 172; bun 16; creat 0.73; k+ 3.8; na++ 135; ca 8.6 gfr >60     Review of Systems  Constitutional:  Negative for malaise/fatigue.  Respiratory:  Negative for cough and shortness of breath.   Cardiovascular:  Negative for chest pain, palpitations and leg swelling.  Gastrointestinal:  Negative for abdominal pain, constipation and heartburn.  Musculoskeletal:  Negative for back pain, joint pain and  myalgias.  Skin: Negative.   Neurological:  Negative for dizziness.  Psychiatric/Behavioral:  The patient is not nervous/anxious.     Physical Exam Constitutional:      General: She is not in acute distress.    Appearance: She is well-developed. She is obese. She is not diaphoretic.  Neck:     Thyroid: No thyromegaly.  Cardiovascular:     Rate and Rhythm: Normal rate and regular rhythm.     Pulses: Normal pulses.     Heart sounds: Normal heart sounds.  Pulmonary:     Effort: Pulmonary effort is normal. No respiratory distress.     Breath sounds: Normal breath sounds.  Abdominal:     General: Bowel sounds are normal. There is no distension.     Palpations: Abdomen is soft.     Tenderness: There is no abdominal tenderness.  Musculoskeletal:        General: Normal range of motion.     Cervical back: Neck supple.     Right lower leg: No edema.     Left lower leg: No edema.  Lymphadenopathy:     Cervical: No cervical adenopathy.  Skin:    General: Skin is warm and dry.  Neurological:     Mental Status: She is alert. Mental status is at baseline.     Comments:  04-12-23: SLUMS 6/30    Psychiatric:        Mood and Affect: Mood normal.      ASSESSMENT/ PLAN:  TODAY  Vitamin B 12 deficiency: level is 806  2. Chronic constipation: will continue miralax daily and senna s daily   3. Hypertension associated with type 2 diabetes mellitus: b/p 136/76 will continue norvasc 10 mg  daily cozaar 50 mg daily; coreg 6.25 mg twice daily   4. Major depression recurrent chronic: will  continue cymbalta 60 mg daily (failed one wean; also takes for pain management); will continue lamictal 25 mg daily to stabilize mood; zoloft 25 mg daily and buspar 5 mg three times daily for anxiety.   PREVIOUS     5.  CKD stage 3 due to type 2 diabetes mellitus: bun 16; creat 0.93; gfr >60   6. Major neurocognitive deficits: SLUMS 6/30.   7. Normocytic anemia: hgb 10.0; hct 32.3  8. Dyslipidemia  associated with type 2 diabetes mellitus: LDL 55 will continue zocor 10 mg daily   9. Aortic atherosclerosis (ct 12-18-19) is on asa and statin   10. 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 Gastroesophageal reflux disease without esophagitis: will stop prilosec   12. OAB: is on myrbetriq 50 mg daily  13. Chronic generalized pain/left sciatic pain: will continue tylenol 650 mg three times daily; mobic 15 mg daily; lyrica 25 mg three times daily  and cymbalta 60 mg daily   14. Chronic non-seasonal allergic rhinitis: will continue claritin 10 mg daily; nasocaort daily  15. Herpes: no recent outbreak; will continue valtrex 500 mg daily    Will check cbc; hgb A1c; vitamin B 12  Synthia Innocent NP [facility]  call [phone]

## 2023-11-30 ENCOUNTER — Other Ambulatory Visit: Payer: Self-pay | Admitting: Adult Health

## 2023-11-30 MED ORDER — PREGABALIN 25 MG PO CAPS: 25.0000 mg | ORAL_CAPSULE | Freq: Three times a day (TID) | ORAL | 0 refills | Status: DC

## 2023-12-15 ENCOUNTER — Other Ambulatory Visit: Payer: Self-pay | Admitting: Adult Health

## 2024-01-02 ENCOUNTER — Other Ambulatory Visit: Payer: Self-pay | Admitting: Adult Health

## 2024-01-02 MED ORDER — PREGABALIN 25 MG PO CAPS: 25.0000 mg | ORAL_CAPSULE | Freq: Three times a day (TID) | ORAL | 0 refills | Status: DC

## 2024-01-05 ENCOUNTER — Encounter: Payer: Self-pay | Admitting: Internal Medicine

## 2024-01-05 ENCOUNTER — Non-Acute Institutional Stay (SKILLED_NURSING_FACILITY): Payer: Medicare Other | Admitting: Internal Medicine

## 2024-01-05 DIAGNOSIS — T81321A Disruption or dehiscence of closure of internal operation (surgical) wound of abdominal wall muscle or fascia, initial encounter: Secondary | ICD-10-CM

## 2024-01-05 DIAGNOSIS — I10 Essential (primary) hypertension: Secondary | ICD-10-CM

## 2024-01-05 DIAGNOSIS — N183 Chronic kidney disease, stage 3 unspecified: Secondary | ICD-10-CM

## 2024-01-05 DIAGNOSIS — E1122 Type 2 diabetes mellitus with diabetic chronic kidney disease: Secondary | ICD-10-CM | POA: Diagnosis not present

## 2024-01-05 NOTE — Assessment & Plan Note (Signed)
Staff has documented consistent weight gain.  This is in the context of dietary nonadherence.  The bedside area is replete with multiple high carb snacks.  Nutritionist to assess. Last TSH was elevated @ 5.410 on 03/23/2023; this should be updated.

## 2024-01-05 NOTE — Progress Notes (Unsigned)
NURSING HOME LOCATION:  [facility] ROOM NUMBER: 115 W  CODE STATUS: Full Code   PCP:  Synthia Innocent NP  This is a nursing facility follow up visit of chronic medical diagnoses & to document compliance with Regulation 483.30 (c) in The [employer] Term [employer] Survey Manual Phase 2 which mandates caregiver visit ( visits can alternate among physician, PA or NP as per statutes) within 10 days of 30 days / 60 days/ 90 days post admission to SNF date    Interim medical record and care since last SNF visit was updated with review of diagnostic studies and change in clinical status since last visit were documented.  HPI: She is a permanent resident of this facility with medical diagnoses of past history of bowel obstruction, diabetes with peripheral neuropathy, GERD, dyslipidemia, essential hypertension, sciatic nerve disease, and history of urticaria. Most recent labs were completed 11/27/2023.  Glucose is 172; A1c is current as of 10/19/2023 and was 6.5% indicating excellent control.  Hyponatremia has resolved with sodium rising from 127 to a normal value of 135.  There is been slight progression of hypocalcemia with calcium level dropping from 8.9-8.6.  Her anemia has improved slightly with H/H rising from 10.0/32.3.  Values of 10.7/34.9.   Review of systems: Staff reports consistent weight gain in the context of dietary nonadherence. The resident's chief concern is a small lesion with minor erythema over the mid abdomen in a vertical distribution at the site of the previous operative procedure for bowel obstruction.  She states that the lesion has been present for a month and she has been applying Vaseline with improvement.  She states that when she would squeeze the edges of the wound there would be scant yellow discharge.  She states that that is no longer the case.  She describes chronic numbness in her feet.  She has no knowledge of her glucoses. She states that she can "pee as good  as cow," by which she means nocturia 4-6 times per night with incontinence.  She denies any dysuria, pyuria or hematuria.  She also describes urinary incontinence. She describes dry mouth and itchy eyes.  She has a cough which is mainly nonproductive with only intermittent scant material.  Constitutional: No fever, significant weight change, fatigue  Eyes: No redness, discharge, pain, vision change ENT/mouth: No nasal congestion,  purulent discharge, earache, change in hearing, sore throat  Cardiovascular: No chest pain, palpitations, paroxysmal nocturnal dyspnea, claudication, edema  Respiratory: No cough, sputum production, hemoptysis, DOE, significant snoring, apnea   Gastrointestinal: No heartburn, dysphagia, abdominal pain, nausea /vomiting, rectal bleeding, melena, change in bowels Genitourinary: No dysuria, hematuria, pyuria, incontinence, nocturia Musculoskeletal: No joint stiffness, joint swelling, weakness, pain Dermatologic: No rash, pruritus, change in appearance of skin Neurologic: No dizziness, headache, syncope, seizures, numbness, tingling Psychiatric: No significant anxiety, depression, insomnia, anorexia Endocrine: No change in hair/skin/nails, excessive thirst, excessive hunger, excessive urination  Hematologic/lymphatic: No significant bruising, lymphadenopathy, abnormal bleeding Allergy/immunology: No itchy/watery eyes, significant sneezing, urticaria, angioedema  Physical exam:  Pertinent or positive findings: She is obese.  She is markedly hard of hearing.  She is edentulous except for 2 lower teeth.  Breath sounds are decreased.  Lateral there is a grade 1/2 systolic murmur at the right base.  First and second heart sounds are accentuated.  Abdomen is protuberant.  There is a vertical elliptical deficit over the mid abdomen approximately 1 inch in height.  There is mild erythema around the lesion.  Pedal  pulses are decreased; dorsalis pedis is strong Posterior tibial  pulses.  The right great toenail is absent.  She she has some small excoriations of the left forearm and small excoriations over the left shin.  The fusiform enlargement of the knees are present.  General appearance: Adequately nourished; no acute distress, increased work of breathing is present.   Lymphatic: No lymphadenopathy about the head, neck, axilla. Eyes: No conjunctival inflammation or lid edema is present. There is no scleral icterus. Ears:  External ear exam shows no significant lesions or deformities.   Nose:  External nasal examination shows no deformity or inflammation. Nasal mucosa are pink and moist without lesions, exudates Oral exam:  Lips and gums are healthy appearing. There is no oropharyngeal erythema or exudate. Neck:  No thyromegaly, masses, tenderness noted.    Heart:  Normal rate and regular rhythm. S1 and S2 normal without gallop, murmur, click, rub .  Lungs: Chest clear to auscultation without wheezes, rhonchi, rales, rubs. Abdomen: Bowel sounds are normal. Abdomen is soft and nontender with no organomegaly, hernias, masses. GU: Deferred  Extremities:  No cyanosis, clubbing, edema  Neurologic exam : Cn 2-7 intact Strength equal  in upper & lower extremities Balance, Rhomberg, finger to nose testing could not be completed due to clinical state Deep tendon reflexes are equal Skin: Warm & dry w/o tenting. No significant lesions or rash.  See summary under each active problem in the Problem List with associated updated therapeutic plan

## 2024-01-05 NOTE — Patient Instructions (Signed)
See assessment and plan under each diagnosis in the problem list and acutely for this visit

## 2024-01-05 NOTE — Assessment & Plan Note (Signed)
She is presently on spironolactone, vasodilating calcium channel blocker, ARB, and low-dose carvedilol.  Blood pressure average will be determined and the carvedilol titrated if persistent systolic hypertension documented.  If this does not result in adequate control; she should be worked up for resistant hypertension as she is on 4 antihypertensive agents.

## 2024-01-05 NOTE — Assessment & Plan Note (Signed)
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 Excellent diabetic control is suggested by an A1c of 6.5% on 10/19/2023.  This will be updated next month.

## 2024-01-09 ENCOUNTER — Encounter: Payer: Self-pay | Admitting: Adult Health

## 2024-01-09 ENCOUNTER — Non-Acute Institutional Stay (SKILLED_NURSING_FACILITY): Payer: Medicare Other | Admitting: Adult Health

## 2024-01-09 DIAGNOSIS — N183 Chronic kidney disease, stage 3 unspecified: Secondary | ICD-10-CM

## 2024-01-09 DIAGNOSIS — D649 Anemia, unspecified: Secondary | ICD-10-CM

## 2024-01-09 DIAGNOSIS — F0390 Unspecified dementia without behavioral disturbance: Secondary | ICD-10-CM

## 2024-01-09 DIAGNOSIS — E1122 Type 2 diabetes mellitus with diabetic chronic kidney disease: Secondary | ICD-10-CM

## 2024-01-09 DIAGNOSIS — R488 Other symbolic dysfunctions: Secondary | ICD-10-CM | POA: Diagnosis not present

## 2024-01-09 NOTE — Progress Notes (Unsigned)
Location:  [facility] Room Number: 114 Place of Service:  SNF (31)   CODE STATUS: full   Allergies  Allergen Reactions   Gabapentin    Lisinopril    Penicillins Nausea Only    Has patient had a PCN reaction causing immediate rash, facial/tongue/throat swelling, SOB or lightheadedness with hypotension: Yes Has patient had a PCN reaction causing severe rash involving mucus membranes or skin necrosis: No Has patient had a PCN reaction that required hospitalization No Has patient had a PCN reaction occurring within the last 10 years: No If all of the above answers are "NO", then may proceed with Cephalosporin use.    Sulfa Antibiotics Nausea And Vomiting    Chief Complaint  Patient presents with   Medical Management of Chronic Issues        CKD stage 3 due to type 2 diabetes mellitus:  Major neurocognitive deficits: Normocytic anemia     HPI:  She is a 83 year old long term resident of this facility being seen for the management of her chronic illnesses: CKD stage 3 due to type 2 diabetes mellitus:  Major neurocognitive deficits: Normocytic anemia. She is having epigastric pain; heart burn and belching since coming off PPI.   Past Medical History:  Diagnosis Date   Anxiety    Bowel obstruction (HCC)    Burning with urination 01/04/2016   Diabetes mellitus with neuropathy    Genital herpes    GERD (gastroesophageal reflux disease)    Hematuria 01/04/2016   Herpes 12/22/2015   Hyperlipidemia    Hypertension    LLQ abdominal tenderness 01/04/2016   Neuropathy    Pelvic pressure in female 01/04/2016   Recurrent UTI    Sciatic nerve disease    Small bowel obstruction (HCC) 04/27/2016   Urinary frequency 01/04/2016   Urticaria     Past Surgical History:  Procedure Laterality Date   ABDOMINAL HYSTERECTOMY     APPLICATION OF INTRAOPERATIVE CT SCAN N/A 12/19/2018   Procedure: APPLICATION OF INTRAOPERATIVE CT SCAN;  Surgeon: Maeola Harman, MD;   Location: [facility] OR;  Service: Neurosurgery;  Laterality: N/A;   bowel obstruction     COLON SURGERY     blocked colon   POSTERIOR CERVICAL FUSION/FORAMINOTOMY N/A 12/19/2018   Procedure: Cervical Seven to Thoracic One Posterior cervical fusion;  Surgeon: Maeola Harman, MD;  Location: [facility] OR;  Service: Neurosurgery;  Laterality: N/A;  Cervical Seven to Thoracic One Posterior cervical fusion    Social History   Socioeconomic History   Marital status: Widowed    Spouse name: Not on file   Number of children: Not on file   Years of education: Not on file   Highest education level: Not on file  Occupational History   Occupation: retired   Tobacco Use   Smoking status: Never   Smokeless tobacco: Current    Types: Snuff  Vaping Use   Vaping status: Never Used  Substance and Sexual Activity   Alcohol use: No   Drug use: No   Sexual activity: Not Currently    Birth control/protection: Surgical    Comment: hyst  Other Topics Concern   Not on file  Social History Narrative   Long term resident of [facility]    Social Drivers of Health   Financial Resource Strain: Not on file  Food Insecurity: Not on file  Transportation Needs: Not on file  Physical Activity: Not on file  Stress: Not on file  Social Connections: Not  on file  Intimate Partner Violence: Not on file   Family History  Problem Relation Age of Onset   Diabetes Mother    Heart disease Brother    Hypertension Brother    Diabetes Brother    Hypertension Daughter    Diabetes Brother    Diabetes Brother    Alzheimer's disease Brother    Anxiety disorder Daughter       VITAL SIGNS BP 136/80   Pulse 72   Temp (!) 97.4 F (36.3 C)   Resp (!) 22   Ht 5' (1.524 m)   Wt 197 lb 12.8 oz (89.7 kg)   SpO2 98%   BMI 38.63 kg/m   Outpatient Encounter Medications as of 01/09/2024  Medication Sig   acetaminophen (TYLENOL) 650 MG CR tablet Take 650 mg by mouth 3 (three) times daily.   amLODipine (NORVASC) 10 MG tablet Take 1  tablet (10 mg total) by mouth daily.   aspirin 81 MG chewable tablet Chew 81 mg by mouth daily.   busPIRone (BUSPAR) 5 MG tablet Take 5 mg by mouth 3 (three) times daily.   carvedilol (COREG) 6.25 MG tablet Take 6.25 mg by mouth 2 (two) times daily with a meal.   Cranberry 500 MG CAPS Take by mouth in the morning and at bedtime. For UTI prevention   diclofenac Sodium (VOLTAREN) 1 % GEL Apply topically 3 (three) times daily as needed.   DULoxetine (CYMBALTA) 60 MG capsule Take 60 mg by mouth daily. Pain management   Insulin Glargine (BASAGLAR KWIKPEN) 100 UNIT/ML Inject 10 Units into the skin at bedtime.   Insulin Pen Needle (BD AUTOSHIELD DUO) 30G X 5 MM MISC by Does not apply route. 3/16"   lamoTRIgine (LAMICTAL) 25 MG tablet Take 25 mg by mouth daily.   linagliptin (TRADJENTA) 5 MG TABS tablet Take 5 mg by mouth daily.   loratadine (CLARITIN) 10 MG tablet Take 10 mg by mouth daily.   losartan (COZAAR) 50 MG tablet Take 50 mg by mouth daily.   Melatonin 5 MG TABS Take 5 mg by mouth at bedtime.   meloxicam (MOBIC) 15 MG tablet Take 15 mg by mouth daily. Hip Pain   metFORMIN (GLUCOPHAGE) 1000 MG tablet Take 1,000 mg by mouth 2 (two) times daily with a meal.    mirabegron ER (MYRBETRIQ) 50 MG TB24 tablet Take 50 mg by mouth daily.   montelukast (SINGULAIR) 10 MG tablet Take 10 mg by mouth daily.   mupirocin ointment (BACTROBAN) 2 % 1 Application 2 (two) times daily.   NON FORMULARY Diet: Regular diet   polyethylene glycol (MIRALAX / GLYCOLAX) packet Take 17 g by mouth daily.   pregabalin (LYRICA) 25 MG capsule Take 1 capsule (25 mg total) by mouth 3 (three) times daily.   senna-docusate (SENOKOT-S) 8.6-50 MG tablet Take 1 tablet by mouth at bedtime.    sertraline (ZOLOFT) 50 MG tablet Take 50 mg by mouth daily.   simvastatin (ZOCOR) 10 MG tablet Take 10 mg by mouth at bedtime.    spironolactone (ALDACTONE) 25 MG tablet Take 25 mg by mouth daily.   triamcinolone (NASACORT) 55 MCG/ACT AERO  nasal inhaler Place 1 spray into the nose daily.   valACYclovir (VALTREX) 500 MG tablet Take 500 mg by mouth daily.    No facility-administered encounter medications on file as of 01/09/2024.     SIGNIFICANT DIAGNOSTIC EXAMS  PREVIOUS   07-27-22: dexa t score: 0.636  NO NEW EXAMS    LABS REVIEWED PREVIOUS;  12-29-22: wbc 10.0; hgb 10.8; hct 35.2 mcv 82.6 plt 212; glucose 131; bun 21; creat 0.95; k+ 3.6; na++ 136; ca 8.6; gfr >60; protein 6.6 albumin 3.6; hgb A1c 7.2; chol 132; ldl 50; trig 134; hdl 55 01-26-23: d-dimer: 0.46 03-02-23: glucose 133; bun 23; creat 0.83; k+ 3.5; na++ 134; ca 9.1 gfr >60 03-23-23: tsh 5.410; vitamin B 12: 806   04-05-23: wbc 19.9; hgb 11.1; hct 34.6; mcv 78.8 plt 306; glucose 137; bun 48; creat 1.11; k+ 3.0; na++ 133; ca 9.0; gfr 50  04-06-23; wbc 19.8; hgb 11.1; hct 35.3; mcv 81.0 plt 312; glucose 95; bun 40; creat 1.21; k+ 4.0; na++ 137; ca 9.2; gfr 45  04-10-23: wbc 14.6; hgb 10.7; hct 34.2; mcv 79.4 plt 236; glucose 106; bun 21; creat 0.84; k+ 3.2; na++ 132; ca 8.7 gfr >60 04-13-23: wbc 10.6; hgb 10.0; hct 32.3; mcv 82.2 plt 219; glucose 96; bun 22; creat 0.92; k+ 3.0; na++ 136; ca 8.5; gfr >60 04-17-23: k+ 4.1 06-01-23: urine microalbumin 7.8 06-22-23: glucose 110; bun 28; creat 0.97; k+ 4.1; na++ 130; ca 8.8; gfr 59 hgb A1c 6.8  TODAY  11-27-23: glucose 172; bun 16; creat 0.73; k+ 3.8; na++ 135; ca 8.6 gfr >60     Review of Systems  Constitutional:  Negative for malaise/fatigue.  Respiratory:  Negative for cough and shortness of breath.   Cardiovascular:  Negative for chest pain, palpitations and leg swelling.  Gastrointestinal:  Positive for abdominal pain and heartburn. Negative for constipation.  Musculoskeletal:  Negative for back pain, joint pain and myalgias.  Skin: Negative.   Neurological:  Negative for dizziness.  Psychiatric/Behavioral:  The patient is not nervous/anxious.    Physical Exam Constitutional:      General: She is not in acute  distress.    Appearance: She is well-developed. She is obese. She is not diaphoretic.  Neck:     Thyroid: No thyromegaly.  Cardiovascular:     Rate and Rhythm: Normal rate and regular rhythm.     Pulses: Normal pulses.     Heart sounds: Normal heart sounds.  Pulmonary:     Effort: Pulmonary effort is normal. No respiratory distress.     Breath sounds: Normal breath sounds.  Abdominal:     General: Bowel sounds are normal. There is no distension.     Palpations: Abdomen is soft.     Tenderness: There is abdominal tenderness.     Comments: Epigastric area   Musculoskeletal:        General: Normal range of motion.     Cervical back: Neck supple.     Right lower leg: No edema.     Left lower leg: No edema.  Lymphadenopathy:     Cervical: No cervical adenopathy.  Skin:    General: Skin is warm and dry.  Neurological:     Mental Status: She is alert. Mental status is at baseline.     Comments:  SLUMS 6/30     Psychiatric:        Mood and Affect: Mood normal.       ASSESSMENT/ PLAN:  TODAY  CKD stage 3 due to type 2 diabetes mellitus: bun 16; creat 0.93; gfr >60  2. Major neurocognitive deficits: SLUMS 6/30  3. Normocytic anemia: hgb 10.0; hct 32.3   PREVIOUS   4. Dyslipidemia associated with type 2 diabetes mellitus: LDL 55 will continue zocor 10 mg daily   5. Aortic atherosclerosis (ct 12-18-19) is on asa and statin  6. Type 2 diabetes mellitus with peripheral angiopathy: hgb A1c 6.8; will continue metformin 1 gm twice daily tradjenta 5 mg daily; semglee 10 units nightly  is on asa statin  7. Gastroesophageal reflux disease without esophagitis: will restart prilosec is unable to come off this medication    8. OAB: is on myrbetriq 50 mg daily  9. Chronic generalized pain/left sciatic pain: will continue tylenol 650 mg three times daily; mobic 15 mg daily; lyrica 25 mg three times daily  and cymbalta 60 mg daily   10. Chronic non-seasonal allergic rhinitis: will  continue claritin 10 mg daily; nasocaort daily  11. Herpes: no recent outbreak; will continue valtrex 500 mg daily   12. Vitamin B 12 deficiency: level is 806  13. Chronic constipation: will continue miralax daily and senna s daily   14. Hypertension associated with type 2 diabetes mellitus: b/p 136/80 will continue norvasc 10 mg daily cozaar 50 mg daily; coreg 6.25 mg twice daily   15. Major depression recurrent chronic: will  continue cymbalta 60 mg daily (failed one wean; also takes for pain management); will continue lamictal 25 mg daily to stabilize mood; zoloft 50 mg daily and buspar 5 mg three times daily for anxiety.     Synthia Innocent NP [facility]   call [phone]

## 2024-01-15 DIAGNOSIS — F0390 Unspecified dementia without behavioral disturbance: Secondary | ICD-10-CM | POA: Diagnosis not present

## 2024-01-15 DIAGNOSIS — R488 Other symbolic dysfunctions: Secondary | ICD-10-CM | POA: Diagnosis not present

## 2024-02-05 ENCOUNTER — Other Ambulatory Visit: Payer: Self-pay | Admitting: Adult Health

## 2024-02-05 MED ORDER — PREGABALIN 25 MG PO CAPS: 25.0000 mg | ORAL_CAPSULE | Freq: Three times a day (TID) | ORAL | 0 refills | Status: DC

## 2024-02-12 DIAGNOSIS — R488 Other symbolic dysfunctions: Secondary | ICD-10-CM | POA: Diagnosis not present

## 2024-02-12 DIAGNOSIS — F0390 Unspecified dementia without behavioral disturbance: Secondary | ICD-10-CM | POA: Diagnosis not present

## 2024-02-16 ENCOUNTER — Non-Acute Institutional Stay (SKILLED_NURSING_FACILITY): Payer: Self-pay | Admitting: Adult Health

## 2024-02-16 ENCOUNTER — Encounter: Payer: Self-pay | Admitting: Adult Health

## 2024-02-16 DIAGNOSIS — F0390 Unspecified dementia without behavioral disturbance: Secondary | ICD-10-CM

## 2024-02-16 DIAGNOSIS — I700 Atherosclerosis of aorta: Secondary | ICD-10-CM

## 2024-02-16 DIAGNOSIS — E1122 Type 2 diabetes mellitus with diabetic chronic kidney disease: Secondary | ICD-10-CM | POA: Diagnosis not present

## 2024-02-16 DIAGNOSIS — N183 Chronic kidney disease, stage 3 unspecified: Secondary | ICD-10-CM

## 2024-02-16 NOTE — Progress Notes (Signed)
 Location:  [facility] Room Number: 112 Place of Service:  SNF (31)   CODE STATUS: full   Allergies  Allergen Reactions   Gabapentin    Lisinopril    Penicillins Nausea Only    Has patient had a PCN reaction causing immediate rash, facial/tongue/throat swelling, SOB or lightheadedness with hypotension: Yes Has patient had a PCN reaction causing severe rash involving mucus membranes or skin necrosis: No Has patient had a PCN reaction that required hospitalization No Has patient had a PCN reaction occurring within the last 10 years: No If all of the above answers are "NO", then may proceed with Cephalosporin use.    Sulfa Antibiotics Nausea And Vomiting    Chief Complaint  Patient presents with   Acute Visit    Care plan meeting     HPI:  We have come together for her care plan meeting. BIMS 12/15 mood 8/30: not sleeping well; becomes nervous; not eating well; decreased energy. She is ambulatory without falls. She is frequently incontinent of bladder and bowel. Dietary: feeds self; weight is 202.2 pounds; regular diet: 76-100%. Therapy: none at this time. She will continue to be followed for her chronic illnesses including:  Aortic atherosclerosis  CKD stage 3 due to type 2 diabetes mellitus   Major neurocognitive disorder  Past Medical History:  Diagnosis Date   Anxiety    Bowel obstruction (HCC)    Burning with urination 01/04/2016   Diabetes mellitus with neuropathy    Genital herpes    GERD (gastroesophageal reflux disease)    Hematuria 01/04/2016   Herpes 12/22/2015   Hyperlipidemia    Hypertension    LLQ abdominal tenderness 01/04/2016   Neuropathy    Pelvic pressure in female 01/04/2016   Recurrent UTI    Sciatic nerve disease    Small bowel obstruction (HCC) 04/27/2016   Urinary frequency 01/04/2016   Urticaria     Past Surgical History:  Procedure Laterality Date   ABDOMINAL HYSTERECTOMY     APPLICATION OF INTRAOPERATIVE CT SCAN  N/A 12/19/2018   Procedure: APPLICATION OF INTRAOPERATIVE CT SCAN;  Surgeon: Maeola Harman, MD;  Location: [facility] OR;  Service: Neurosurgery;  Laterality: N/A;   bowel obstruction     COLON SURGERY     blocked colon   POSTERIOR CERVICAL FUSION/FORAMINOTOMY N/A 12/19/2018   Procedure: Cervical Seven to Thoracic One Posterior cervical fusion;  Surgeon: Maeola Harman, MD;  Location: [facility] OR;  Service: Neurosurgery;  Laterality: N/A;  Cervical Seven to Thoracic One Posterior cervical fusion    Social History   Socioeconomic History   Marital status: Widowed    Spouse name: Not on file   Number of children: Not on file   Years of education: Not on file   Highest education level: Not on file  Occupational History   Occupation: retired   Tobacco Use   Smoking status: Never   Smokeless tobacco: Current    Types: Snuff  Vaping Use   Vaping status: Never Used  Substance and Sexual Activity   Alcohol use: No   Drug use: No   Sexual activity: Not Currently    Birth control/protection: Surgical    Comment: hyst  Other Topics Concern   Not on file  Social History Narrative   Long term resident of [facility]    Social Drivers of Health   Financial Resource Strain: Not on file  Food Insecurity: Not on file  Transportation Needs: Not on file  Physical Activity: Not  on file  Stress: Not on file  Social Connections: Not on file  Intimate Partner Violence: Not on file   Family History  Problem Relation Age of Onset   Diabetes Mother    Heart disease Brother    Hypertension Brother    Diabetes Brother    Hypertension Daughter    Diabetes Brother    Diabetes Brother    Alzheimer's disease Brother    Anxiety disorder Daughter       VITAL SIGNS BP 130/70   Pulse 74   Temp 98.6 F (37 C)   Resp 20   Ht 5\' 1"  (1.549 m)   Wt 205 lb (93 kg)   SpO2 98%   BMI 38.73 kg/m   Outpatient Encounter Medications as of 02/16/2024  Medication Sig   acetaminophen (TYLENOL) 650 MG CR tablet Take 650 mg  by mouth 3 (three) times daily.   amLODipine (NORVASC) 10 MG tablet Take 1 tablet (10 mg total) by mouth daily.   aspirin 81 MG chewable tablet Chew 81 mg by mouth daily.   busPIRone (BUSPAR) 5 MG tablet Take 5 mg by mouth 3 (three) times daily.   carvedilol (COREG) 6.25 MG tablet Take 6.25 mg by mouth 2 (two) times daily with a meal.   Cranberry 500 MG CAPS Take by mouth in the morning and at bedtime. For UTI prevention   diclofenac Sodium (VOLTAREN) 1 % GEL Apply topically 3 (three) times daily as needed.   DULoxetine (CYMBALTA) 60 MG capsule Take 60 mg by mouth daily. Pain management   Insulin Glargine (BASAGLAR KWIKPEN) 100 UNIT/ML Inject 10 Units into the skin at bedtime.   Insulin Pen Needle (BD AUTOSHIELD DUO) 30G X 5 MM MISC by Does not apply route. 3/16"   lamoTRIgine (LAMICTAL) 25 MG tablet Take 25 mg by mouth daily.   linagliptin (TRADJENTA) 5 MG TABS tablet Take 5 mg by mouth daily.   loratadine (CLARITIN) 10 MG tablet Take 10 mg by mouth daily.   losartan (COZAAR) 50 MG tablet Take 50 mg by mouth daily.   Melatonin 5 MG TABS Take 5 mg by mouth at bedtime.   meloxicam (MOBIC) 15 MG tablet Take 15 mg by mouth daily. Hip Pain   metFORMIN (GLUCOPHAGE) 1000 MG tablet Take 1,000 mg by mouth 2 (two) times daily with a meal.    mirabegron ER (MYRBETRIQ) 50 MG TB24 tablet Take 50 mg by mouth daily.   montelukast (SINGULAIR) 10 MG tablet Take 10 mg by mouth daily.   mupirocin ointment (BACTROBAN) 2 % 1 Application 2 (two) times daily.   NON FORMULARY Diet: Regular diet   omeprazole (PRILOSEC) 20 MG capsule Take 20 mg by mouth daily.   polyethylene glycol (MIRALAX / GLYCOLAX) packet Take 17 g by mouth daily.   pregabalin (LYRICA) 25 MG capsule Take 1 capsule (25 mg total) by mouth 3 (three) times daily.   senna-docusate (SENOKOT-S) 8.6-50 MG tablet Take 1 tablet by mouth at bedtime.    sertraline (ZOLOFT) 50 MG tablet Take 50 mg by mouth daily.   simvastatin (ZOCOR) 10 MG tablet Take 10  mg by mouth at bedtime.    spironolactone (ALDACTONE) 25 MG tablet Take 25 mg by mouth daily.   triamcinolone (NASACORT) 55 MCG/ACT AERO nasal inhaler Place 1 spray into the nose daily.   valACYclovir (VALTREX) 500 MG tablet Take 500 mg by mouth daily.    No facility-administered encounter medications on file as of 02/16/2024.     SIGNIFICANT  DIAGNOSTIC EXAMS   LABS REVIEWED PREVIOUS;     01-26-23: d-dimer: 0.46 03-02-23: glucose 133; bun 23; creat 0.83; k+ 3.5; na++ 134; ca 9.1 gfr >60 03-23-23: tsh 5.410; vitamin B 12: 806   04-05-23: wbc 19.9; hgb 11.1; hct 34.6; mcv 78.8 plt 306; glucose 137; bun 48; creat 1.11; k+ 3.0; na++ 133; ca 9.0; gfr 50  04-06-23; wbc 19.8; hgb 11.1; hct 35.3; mcv 81.0 plt 312; glucose 95; bun 40; creat 1.21; k+ 4.0; na++ 137; ca 9.2; gfr 45  04-10-23: wbc 14.6; hgb 10.7; hct 34.2; mcv 79.4 plt 236; glucose 106; bun 21; creat 0.84; k+ 3.2; na++ 132; ca 8.7 gfr >60 04-13-23: wbc 10.6; hgb 10.0; hct 32.3; mcv 82.2 plt 219; glucose 96; bun 22; creat 0.92; k+ 3.0; na++ 136; ca 8.5; gfr >60 04-17-23: k+ 4.1 06-01-23: urine microalbumin 7.8 06-22-23: glucose 110; bun 28; creat 0.97; k+ 4.1; na++ 130; ca 8.8; gfr 59 hgb A1c 6.8 11-27-23: glucose 172; bun 16; creat 0.73; k+ 3.8; na++ 135; ca 8.6 gfr >60     NO NEW LABS.   Review of Systems  Constitutional:  Negative for malaise/fatigue.  Respiratory:  Negative for cough and shortness of breath.   Cardiovascular:  Negative for chest pain, palpitations and leg swelling.  Gastrointestinal:  Negative for abdominal pain, constipation and heartburn.  Musculoskeletal:  Negative for back pain, joint pain and myalgias.  Skin: Negative.   Neurological:  Negative for dizziness.  Psychiatric/Behavioral:  The patient is not nervous/anxious.    Physical Exam Constitutional:      General: She is not in acute distress.    Appearance: She is well-developed and overweight. She is not diaphoretic.  Neck:     Thyroid: No thyromegaly.   Cardiovascular:     Rate and Rhythm: Normal rate and regular rhythm.     Pulses: Normal pulses.     Heart sounds: Normal heart sounds.  Pulmonary:     Effort: Pulmonary effort is normal. No respiratory distress.     Breath sounds: Normal breath sounds.  Abdominal:     General: Bowel sounds are normal. There is no distension.     Palpations: Abdomen is soft.     Tenderness: There is no abdominal tenderness.  Musculoskeletal:        General: Normal range of motion.     Cervical back: Neck supple.     Right lower leg: No edema.     Left lower leg: No edema.  Lymphadenopathy:     Cervical: No cervical adenopathy.  Skin:    General: Skin is warm and dry.  Neurological:     Mental Status: She is alert. Mental status is at baseline.     Comments: SLUMS 6/30     Psychiatric:        Mood and Affect: Mood normal.      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   Time spent with patient: 40 minutes: medications; dietary; plan of care.    Synthia Innocent NP [facility]   call [phone]

## 2024-02-20 ENCOUNTER — Non-Acute Institutional Stay (SKILLED_NURSING_FACILITY): Payer: Self-pay | Admitting: Adult Health

## 2024-02-20 ENCOUNTER — Encounter: Payer: Self-pay | Admitting: Adult Health

## 2024-02-20 DIAGNOSIS — I700 Atherosclerosis of aorta: Secondary | ICD-10-CM

## 2024-02-20 DIAGNOSIS — E785 Hyperlipidemia, unspecified: Secondary | ICD-10-CM

## 2024-02-20 DIAGNOSIS — E1151 Type 2 diabetes mellitus with diabetic peripheral angiopathy without gangrene: Secondary | ICD-10-CM | POA: Diagnosis not present

## 2024-02-20 DIAGNOSIS — E1169 Type 2 diabetes mellitus with other specified complication: Secondary | ICD-10-CM

## 2024-02-20 NOTE — Progress Notes (Unsigned)
 Location:  [facility] Room Number: 111 Place of Service:  SNF (31)   CODE STATUS: full   Allergies  Allergen Reactions   Gabapentin    Lisinopril    Penicillins Nausea Only    Has patient had a PCN reaction causing immediate rash, facial/tongue/throat swelling, SOB or lightheadedness with hypotension: Yes Has patient had a PCN reaction causing severe rash involving mucus membranes or skin necrosis: No Has patient had a PCN reaction that required hospitalization No Has patient had a PCN reaction occurring within the last 10 years: No If all of the above answers are "NO", then may proceed with Cephalosporin use.    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 There are no reports of uncontrolled pain; her current regimen is effective. There are no reports of depressive or anxious thoughts.   Past Medical History:  Diagnosis Date   Anxiety    Bowel obstruction (HCC)    Burning with urination 01/04/2016   Diabetes mellitus with neuropathy    Genital herpes    GERD (gastroesophageal reflux disease)    Hematuria 01/04/2016   Herpes 12/22/2015   Hyperlipidemia    Hypertension    LLQ abdominal tenderness 01/04/2016   Neuropathy    Pelvic pressure in female 01/04/2016   Recurrent UTI    Sciatic nerve disease    Small bowel obstruction (HCC) 04/27/2016   Urinary frequency 01/04/2016   Urticaria     Past Surgical History:  Procedure Laterality Date   ABDOMINAL HYSTERECTOMY      APPLICATION OF INTRAOPERATIVE CT SCAN N/A 12/19/2018   Procedure: APPLICATION OF INTRAOPERATIVE CT SCAN;  Surgeon: Maeola Harman, MD;  Location: [facility] OR;  Service: Neurosurgery;  Laterality: N/A;   bowel obstruction     COLON SURGERY     blocked colon   POSTERIOR CERVICAL FUSION/FORAMINOTOMY N/A 12/19/2018   Procedure: Cervical Seven to Thoracic One Posterior cervical fusion;  Surgeon: Maeola Harman, MD;  Location: [facility] OR;  Service: Neurosurgery;  Laterality: N/A;  Cervical Seven to Thoracic One Posterior cervical fusion    Social History   Socioeconomic History   Marital status: Widowed    Spouse name: Not on file   Number of children: Not on file   Years of education: Not on file   Highest education level: Not on file  Occupational History   Occupation: retired   Tobacco Use   Smoking status: Never   Smokeless tobacco: Current    Types: Snuff  Vaping Use   Vaping status: Never Used  Substance and Sexual Activity   Alcohol use: No   Drug use: No   Sexual activity: Not Currently    Birth control/protection: Surgical    Comment: hyst  Other Topics Concern   Not on file  Social History Narrative   Long term resident of [facility]    Social Drivers of Health   Financial Resource Strain: Not on file  Food Insecurity: Not on file  Transportation Needs: Not on file  Physical Activity: Not on file  Stress: Not on file  Social Connections: Not on file  Intimate Partner Violence: Not on file   Family History  Problem Relation Age of Onset   Diabetes Mother    Heart disease Brother    Hypertension Brother    Diabetes Brother    Hypertension Daughter    Diabetes Brother    Diabetes Brother    Alzheimer's disease Brother    Anxiety disorder Daughter       VITAL SIGNS BP (!) 145/73   Pulse 72   Temp 98.2 F (36.8 C)   Resp 20   Ht 5\' 1"  (1.549 m)   Wt 205 lb (93 kg)   SpO2 98%   BMI 38.73 kg/m   Outpatient Encounter Medications as of 02/20/2024  Medication Sig    acetaminophen (TYLENOL) 650 MG CR tablet Take 650 mg by mouth 3 (three) times daily.   amLODipine (NORVASC) 10 MG tablet Take 1 tablet (10 mg total) by mouth daily.   aspirin 81 MG chewable tablet Chew 81 mg by mouth daily.   busPIRone (BUSPAR) 5 MG tablet Take 5 mg by mouth 3 (three) times daily.   carvedilol (COREG) 6.25 MG tablet Take 6.25 mg by mouth 2 (two) times daily with a meal.   Cranberry 500 MG CAPS Take by mouth in the morning and at bedtime. For UTI prevention   diclofenac Sodium (VOLTAREN) 1 % GEL Apply topically 3 (three) times daily as needed.   DULoxetine (CYMBALTA) 60 MG capsule Take 60 mg by mouth daily. Pain management   Insulin Glargine (BASAGLAR KWIKPEN) 100 UNIT/ML Inject 10 Units into the skin at bedtime.   Insulin Pen Needle (BD AUTOSHIELD DUO) 30G X 5 MM MISC by Does not apply route. 3/16"   lamoTRIgine (LAMICTAL) 25 MG tablet Take 25 mg by mouth daily.   linagliptin (TRADJENTA) 5 MG TABS tablet Take 5 mg by mouth daily.   loratadine (CLARITIN) 10 MG tablet Take 10 mg by mouth daily.   losartan (COZAAR) 50 MG tablet Take 50 mg by mouth daily.   Melatonin 5 MG TABS Take 5 mg by mouth at bedtime.   meloxicam (MOBIC) 15 MG tablet Take 15 mg by mouth daily. Hip Pain   metFORMIN (GLUCOPHAGE) 1000 MG tablet Take 1,000 mg by mouth 2 (two) times daily with a meal.    mirabegron ER (MYRBETRIQ) 50 MG TB24 tablet Take 50 mg by mouth daily.   montelukast (SINGULAIR) 10 MG tablet Take 10 mg by mouth daily.   mupirocin ointment (BACTROBAN) 2 % 1 Application 2 (two) times daily.   NON FORMULARY Diet: Regular diet   omeprazole (PRILOSEC) 20 MG capsule Take 20 mg by mouth daily.   polyethylene glycol (MIRALAX / GLYCOLAX) packet Take 17 g by mouth daily.   pregabalin (LYRICA) 25 MG capsule Take 1 capsule (25 mg total) by mouth 3 (three) times daily.   senna-docusate (SENOKOT-S) 8.6-50 MG tablet Take 1 tablet by mouth at bedtime.    sertraline (ZOLOFT) 50 MG tablet Take 50 mg by mouth  daily.   simvastatin (ZOCOR) 10 MG tablet Take 10 mg by mouth at bedtime.    spironolactone (ALDACTONE) 25 MG tablet Take 25 mg by mouth daily.   triamcinolone (NASACORT) 55 MCG/ACT AERO nasal inhaler Place 1 spray into the nose daily.   valACYclovir (VALTREX) 500 MG tablet Take 500 mg by mouth daily.    No facility-administered encounter medications on file as of 02/20/2024.  SIGNIFICANT DIAGNOSTIC EXAMS   LABS REVIEWED PREVIOUS;     12-29-22: wbc 10.0; hgb 10.8; hct 35.2 mcv 82.6 plt 212; glucose 131; bun 21; creat 0.95; k+ 3.6; na++ 136; ca 8.6; gfr >60; protein 6.6 albumin 3.6; hgb A1c 7.2; chol 132; ldl 50; trig 134; hdl 55 01-26-23: d-dimer: 0.46 03-02-23: glucose 133; bun 23; creat 0.83; k+ 3.5; na++ 134; ca 9.1 gfr >60 03-23-23: tsh 5.410; vitamin B 12: 806   04-05-23: wbc 19.9; hgb 11.1; hct 34.6; mcv 78.8 plt 306; glucose 137; bun 48; creat 1.11; k+ 3.0; na++ 133; ca 9.0; gfr 50  04-06-23; wbc 19.8; hgb 11.1; hct 35.3; mcv 81.0 plt 312; glucose 95; bun 40; creat 1.21; k+ 4.0; na++ 137; ca 9.2; gfr 45  04-10-23: wbc 14.6; hgb 10.7; hct 34.2; mcv 79.4 plt 236; glucose 106; bun 21; creat 0.84; k+ 3.2; na++ 132; ca 8.7 gfr >60 04-13-23: wbc 10.6; hgb 10.0; hct 32.3; mcv 82.2 plt 219; glucose 96; bun 22; creat 0.92; k+ 3.0; na++ 136; ca 8.5; gfr >60 04-17-23: k+ 4.1 06-01-23: urine microalbumin 7.8 06-22-23: glucose 110; bun 28; creat 0.97; k+ 4.1; na++ 130; ca 8.8; gfr 59 hgb A1c 6.8  TODAY  11-27-23: glucose 172; bun 16; creat 0.73; k+ 3.8; na++ 135; ca 8.6 gfr >60     Review of Systems  Constitutional:  Negative for malaise/fatigue.  Respiratory:  Negative for cough and shortness of breath.   Cardiovascular:  Negative for chest pain, palpitations and leg swelling.  Gastrointestinal:  Negative for abdominal pain, constipation and heartburn.  Musculoskeletal:  Negative for back pain, joint pain and myalgias.  Skin: Negative.   Neurological:  Negative for dizziness.   Psychiatric/Behavioral:  The patient is not nervous/anxious.    Physical Exam Constitutional:      General: She is not in acute distress.    Appearance: She is well-developed. She is not diaphoretic.  Neck:     Thyroid: No thyromegaly.  Cardiovascular:     Rate and Rhythm: Normal rate and regular rhythm.     Heart sounds: Normal heart sounds.  Pulmonary:     Effort: Pulmonary effort is normal. No respiratory distress.     Breath sounds: Normal breath sounds.  Abdominal:     General: Bowel sounds are normal. There is no distension.     Palpations: Abdomen is soft.     Tenderness: There is no abdominal tenderness.  Musculoskeletal:        General: Normal range of motion.     Cervical back: Neck supple.     Right lower leg: No edema.     Left lower leg: No edema.  Lymphadenopathy:     Cervical: No cervical adenopathy.  Skin:    General: Skin is warm and dry.  Neurological:     Mental Status: She is alert. Mental status is at baseline.     Comments:  SLUMS 6/30   Psychiatric:        Mood and Affect: Mood normal.      ASSESSMENT/ PLAN:  TODAY  Aortic atherosclerosis (ct 12-18-19) is on asa and statin  2. Dyslipidemia associated with type 2 diabetes mellitus: LDL 55 will continue zocor 10 mg daily   3. 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 Gastroesophageal reflux disease without esophagitis: will continue prilosec is unable  to come off this medication    5. OAB: is on myrbetriq 50 mg daily  6. Chronic generalized pain/left sciatic pain: will continue tylenol 650 mg three times daily; mobic 15 mg daily; lyrica 25 mg three times daily  and cymbalta 60 mg daily   7. Chronic non-seasonal allergic rhinitis: will continue claritin 10 mg daily; nasocaort daily  8. Herpes: no recent outbreak; will continue valtrex 500 mg daily    9. Vitamin B 12 deficiency: level is 806  10. Chronic constipation: will continue miralax daily and senna s daily   11. Hypertension associated with type 2 diabetes mellitus: b/p 136/80 will continue norvasc 10 mg daily cozaar 50 mg daily; coreg 6.25 mg twice daily   12. Major depression recurrent chronic: will  continue cymbalta 60 mg daily (failed one wean; also takes for pain management); will continue lamictal 25 mg daily to stabilize mood; zoloft 50 mg daily and buspar 5 mg three times daily for anxiety.   13. CKD stage 3 due to type 2 diabetes mellitus: bun 16; creat 0.93; gfr >60  14. Major neurocognitive deficits: SLUMS 6/30  15. Normocytic anemia: hgb 10.0; hct 32.3    Synthia Innocent NP [facility]   call [phone]

## 2024-03-01 ENCOUNTER — Other Ambulatory Visit: Payer: Self-pay | Admitting: Adult Health

## 2024-03-01 MED ORDER — PREGABALIN 25 MG PO CAPS: 25.0000 mg | ORAL_CAPSULE | Freq: Three times a day (TID) | ORAL | 0 refills | Status: DC

## 2024-03-12 DIAGNOSIS — Z23 Encounter for immunization: Secondary | ICD-10-CM | POA: Diagnosis not present

## 2024-03-14 ENCOUNTER — Encounter: Payer: Self-pay | Admitting: Adult Health

## 2024-03-14 ENCOUNTER — Non-Acute Institutional Stay (SKILLED_NURSING_FACILITY): Payer: Self-pay | Admitting: Adult Health

## 2024-03-14 DIAGNOSIS — R635 Abnormal weight gain: Secondary | ICD-10-CM

## 2024-03-14 DIAGNOSIS — F0390 Unspecified dementia without behavioral disturbance: Secondary | ICD-10-CM

## 2024-03-14 NOTE — Progress Notes (Signed)
 Location:  [facility] Room Number: 110 Place of Service:  SNF (31)   CODE STATUS: full code   Allergies  Allergen Reactions   Gabapentin    Lisinopril    Penicillins Nausea Only    Has patient had a PCN reaction causing immediate rash, facial/tongue/throat swelling, SOB or lightheadedness with hypotension: Yes Has patient had a PCN reaction causing severe rash involving mucus membranes or skin necrosis: No Has patient had a PCN reaction that required hospitalization No Has patient had a PCN reaction occurring within the last 10 years: No If all of the above answers are "NO", then may proceed with Cephalosporin use.    Sulfa Antibiotics Nausea And Vomiting    Chief Complaint  Patient presents with   Acute Visit    Weight gain     HPI:  She is gaining weight. 6 months ago her weight was 192 pounds 3 months ago 197 pounds; March 203 pounds and current weight 208 pounds. This is a 16 pounds weight gain over the past 6 months. There are no reports of excessive eating present. She does continue to get out of bed daily and does ambulate.   Past Medical History:  Diagnosis Date   Anxiety    Bowel obstruction (HCC)    Burning with urination 01/04/2016   Diabetes mellitus with neuropathy    Genital herpes    GERD (gastroesophageal reflux disease)    Hematuria 01/04/2016   Herpes 12/22/2015   Hyperlipidemia    Hypertension    LLQ abdominal tenderness 01/04/2016   Neuropathy    Pelvic pressure in female 01/04/2016   Recurrent UTI    Sciatic nerve disease    Small bowel obstruction (HCC) 04/27/2016   Urinary frequency 01/04/2016   Urticaria     Past Surgical History:  Procedure Laterality Date   ABDOMINAL HYSTERECTOMY     APPLICATION OF INTRAOPERATIVE CT SCAN N/A 12/19/2018   Procedure: APPLICATION OF INTRAOPERATIVE CT SCAN;  Surgeon: Maeola Harman, MD;  Location: [facility] OR;  Service: Neurosurgery;  Laterality: N/A;   bowel obstruction     COLON  SURGERY     blocked colon   POSTERIOR CERVICAL FUSION/FORAMINOTOMY N/A 12/19/2018   Procedure: Cervical Seven to Thoracic One Posterior cervical fusion;  Surgeon: Maeola Harman, MD;  Location: [facility] OR;  Service: Neurosurgery;  Laterality: N/A;  Cervical Seven to Thoracic One Posterior cervical fusion    Social History   Socioeconomic History   Marital status: Widowed    Spouse name: Not on file   Number of children: Not on file   Years of education: Not on file   Highest education level: Not on file  Occupational History   Occupation: retired   Tobacco Use   Smoking status: Never   Smokeless tobacco: Current    Types: Snuff  Vaping Use   Vaping status: Never Used  Substance and Sexual Activity   Alcohol use: No   Drug use: No   Sexual activity: Not Currently    Birth control/protection: Surgical    Comment: hyst  Other Topics Concern   Not on file  Social History Narrative   Long term resident of [facility]    Social Drivers of Health   Financial Resource Strain: Not on file  Food Insecurity: Not on file  Transportation Needs: Not on file  Physical Activity: Not on file  Stress: Not on file  Social Connections: Not on file  Intimate Partner Violence: Not on file  Family History  Problem Relation Age of Onset   Diabetes Mother    Heart disease Brother    Hypertension Brother    Diabetes Brother    Hypertension Daughter    Diabetes Brother    Diabetes Brother    Alzheimer's disease Brother    Anxiety disorder Daughter       VITAL SIGNS BP (!) 126/55   Pulse 78   Temp 97.7 F (36.5 C)   Resp 20   Ht 5\' 1"  (1.549 m)   Wt 208 lb (94.3 kg)   SpO2 97%   BMI 39.30 kg/m   Outpatient Encounter Medications as of 03/14/2024  Medication Sig   acetaminophen (TYLENOL) 650 MG CR tablet Take 650 mg by mouth 3 (three) times daily.   amLODipine (NORVASC) 10 MG tablet Take 1 tablet (10 mg total) by mouth daily.   aspirin 81 MG chewable tablet Chew 81 mg by mouth daily.    busPIRone (BUSPAR) 5 MG tablet Take 5 mg by mouth 3 (three) times daily.   carvedilol (COREG) 6.25 MG tablet Take 6.25 mg by mouth 2 (two) times daily with a meal.   Cranberry 500 MG CAPS Take by mouth in the morning and at bedtime. For UTI prevention   diclofenac Sodium (VOLTAREN) 1 % GEL Apply topically 3 (three) times daily as needed.   DULoxetine (CYMBALTA) 60 MG capsule Take 60 mg by mouth daily. Pain management   Insulin Glargine (BASAGLAR KWIKPEN) 100 UNIT/ML Inject 10 Units into the skin at bedtime.   Insulin Pen Needle (BD AUTOSHIELD DUO) 30G X 5 MM MISC by Does not apply route. 3/16"   lamoTRIgine (LAMICTAL) 25 MG tablet Take 25 mg by mouth daily.   linagliptin (TRADJENTA) 5 MG TABS tablet Take 5 mg by mouth daily.   loratadine (CLARITIN) 10 MG tablet Take 10 mg by mouth daily.   losartan (COZAAR) 50 MG tablet Take 50 mg by mouth daily.   Melatonin 5 MG TABS Take 5 mg by mouth at bedtime.   meloxicam (MOBIC) 15 MG tablet Take 15 mg by mouth daily. Hip Pain   metFORMIN (GLUCOPHAGE) 1000 MG tablet Take 1,000 mg by mouth 2 (two) times daily with a meal.    mirabegron ER (MYRBETRIQ) 50 MG TB24 tablet Take 50 mg by mouth daily.   montelukast (SINGULAIR) 10 MG tablet Take 10 mg by mouth daily.   mupirocin ointment (BACTROBAN) 2 % 1 Application 2 (two) times daily.   NON FORMULARY Diet: Regular diet   omeprazole (PRILOSEC) 20 MG capsule Take 20 mg by mouth daily.   polyethylene glycol (MIRALAX / GLYCOLAX) packet Take 17 g by mouth daily.   pregabalin (LYRICA) 25 MG capsule Take 1 capsule (25 mg total) by mouth 3 (three) times daily.   senna-docusate (SENOKOT-S) 8.6-50 MG tablet Take 1 tablet by mouth at bedtime.    sertraline (ZOLOFT) 50 MG tablet Take 50 mg by mouth daily.   simvastatin (ZOCOR) 10 MG tablet Take 10 mg by mouth at bedtime.    spironolactone (ALDACTONE) 25 MG tablet Take 25 mg by mouth daily.   triamcinolone (NASACORT) 55 MCG/ACT AERO nasal inhaler Place 1 spray into the  nose daily.   valACYclovir (VALTREX) 500 MG tablet Take 500 mg by mouth daily.    No facility-administered encounter medications on file as of 03/14/2024.     SIGNIFICANT DIAGNOSTIC EXAMS  LABS REVIEWED PREVIOUS;     03-02-23: glucose 133; bun 23; creat 0.83; k+ 3.5; na++ 134;  ca 9.1 gfr >60 03-23-23: tsh 5.410; vitamin B 12: 806   04-05-23: wbc 19.9; hgb 11.1; hct 34.6; mcv 78.8 plt 306; glucose 137; bun 48; creat 1.11; k+ 3.0; na++ 133; ca 9.0; gfr 50  04-06-23; wbc 19.8; hgb 11.1; hct 35.3; mcv 81.0 plt 312; glucose 95; bun 40; creat 1.21; k+ 4.0; na++ 137; ca 9.2; gfr 45  04-10-23: wbc 14.6; hgb 10.7; hct 34.2; mcv 79.4 plt 236; glucose 106; bun 21; creat 0.84; k+ 3.2; na++ 132; ca 8.7 gfr >60 04-13-23: wbc 10.6; hgb 10.0; hct 32.3; mcv 82.2 plt 219; glucose 96; bun 22; creat 0.92; k+ 3.0; na++ 136; ca 8.5; gfr >60 04-17-23: k+ 4.1 06-01-23: urine microalbumin 7.8 06-22-23: glucose 110; bun 28; creat 0.97; k+ 4.1; na++ 130; ca 8.8; gfr 59 hgb A1c 6.8 11-27-23: glucose 172; bun 16; creat 0.73; k+ 3.8; na++ 135; ca 8.6 gfr >60    NO NEW LABS.   Review of Systems  Constitutional:  Negative for malaise/fatigue.  Respiratory:  Negative for cough and shortness of breath.   Cardiovascular:  Negative for chest pain, palpitations and leg swelling.  Gastrointestinal:  Negative for abdominal pain, constipation and heartburn.  Musculoskeletal:  Negative for back pain, joint pain and myalgias.  Skin: Negative.   Neurological:  Negative for dizziness.  Psychiatric/Behavioral:  The patient is not nervous/anxious.    Physical Exam Constitutional:      General: She is not in acute distress.    Appearance: She is well-developed. She is obese. She is not diaphoretic.  Neck:     Thyroid: No thyromegaly.  Cardiovascular:     Rate and Rhythm: Normal rate and regular rhythm.     Heart sounds: Normal heart sounds.  Pulmonary:     Effort: Pulmonary effort is normal. No respiratory distress.     Breath  sounds: Normal breath sounds.  Abdominal:     General: Bowel sounds are normal. There is no distension.     Palpations: Abdomen is soft.     Tenderness: There is no abdominal tenderness.  Musculoskeletal:        General: Normal range of motion.     Cervical back: Neck supple.     Right lower leg: No edema.     Left lower leg: No edema.  Lymphadenopathy:     Cervical: No cervical adenopathy.  Skin:    General: Skin is warm and dry.  Neurological:     Mental Status: She is alert. Mental status is at baseline.     Comments: SLUMS 6/30    Psychiatric:        Mood and Affect: Mood normal.    ASSESSMENT/ PLAN:  TODAY  Major neurocognitive disorder Weight gain  Will get cbc, cmp hgb A1c; tsh  Will continue to monitor her weight.    Synthia Innocent NP [facility]   call [phone]

## 2024-03-18 ENCOUNTER — Non-Acute Institutional Stay (SKILLED_NURSING_FACILITY): Payer: Self-pay | Admitting: Adult Health

## 2024-03-18 ENCOUNTER — Other Ambulatory Visit (HOSPITAL_COMMUNITY)
Admission: RE | Admit: 2024-03-18 | Discharge: 2024-03-18 | Disposition: A | Discharge status: Home / Self Care | Source: Skilled Nursing Facility | Attending: Adult Health | Admitting: Adult Health

## 2024-03-18 ENCOUNTER — Encounter: Payer: Self-pay | Admitting: Adult Health

## 2024-03-18 DIAGNOSIS — M5432 Sciatica, left side: Secondary | ICD-10-CM

## 2024-03-18 DIAGNOSIS — E1159 Type 2 diabetes mellitus with other circulatory complications: Secondary | ICD-10-CM | POA: Diagnosis not present

## 2024-03-18 DIAGNOSIS — E1122 Type 2 diabetes mellitus with diabetic chronic kidney disease: Secondary | ICD-10-CM | POA: Diagnosis not present

## 2024-03-18 DIAGNOSIS — N183 Chronic kidney disease, stage 3 unspecified: Secondary | ICD-10-CM

## 2024-03-18 DIAGNOSIS — M5416 Radiculopathy, lumbar region: Secondary | ICD-10-CM | POA: Diagnosis not present

## 2024-03-18 DIAGNOSIS — I152 Hypertension secondary to endocrine disorders: Secondary | ICD-10-CM | POA: Insufficient documentation

## 2024-03-18 LAB — HEMOGLOBIN A1C
Hgb A1c MFr Bld: 9.4 % — ABNORMAL HIGH (ref 4.8–5.6)
Mean Plasma Glucose: 223.08 mg/dL

## 2024-03-18 LAB — TSH: TSH: 4.148 u[IU]/mL (ref 0.350–4.500)

## 2024-03-18 LAB — CBC
HCT: 36.2 % (ref 36.0–46.0)
Hemoglobin: 11.1 g/dL — ABNORMAL LOW (ref 12.0–15.0)
MCH: 24.8 pg — ABNORMAL LOW (ref 26.0–34.0)
MCHC: 30.7 g/dL (ref 30.0–36.0)
MCV: 81 fL (ref 80.0–100.0)
Platelets: 258 10*3/uL (ref 150–400)
RBC: 4.47 MIL/uL (ref 3.87–5.11)
RDW: 14.6 % (ref 11.5–15.5)
WBC: 10.8 10*3/uL — ABNORMAL HIGH (ref 4.0–10.5)
nRBC: 0 % (ref 0.0–0.2)

## 2024-03-18 LAB — COMPREHENSIVE METABOLIC PANEL WITH GFR
ALT: 17 U/L (ref 0–44)
AST: 16 U/L (ref 15–41)
Albumin: 3.8 g/dL (ref 3.5–5.0)
Alkaline Phosphatase: 77 U/L (ref 38–126)
Anion gap: 14 (ref 5–15)
BUN: 24 mg/dL — ABNORMAL HIGH (ref 8–23)
CO2: 22 mmol/L (ref 22–32)
Calcium: 9.3 mg/dL (ref 8.9–10.3)
Chloride: 94 mmol/L — ABNORMAL LOW (ref 98–111)
Creatinine, Ser: 1.13 mg/dL — ABNORMAL HIGH (ref 0.44–1.00)
GFR, Estimated: 49 mL/min — ABNORMAL LOW (ref >60)
Glucose, Bld: 173 mg/dL — ABNORMAL HIGH (ref 70–99)
Potassium: 4.7 mmol/L (ref 3.5–5.1)
Sodium: 130 mmol/L — ABNORMAL LOW (ref 135–145)
Total Bilirubin: 0.3 mg/dL (ref 0.0–1.2)
Total Protein: 7.2 g/dL (ref 6.5–8.1)

## 2024-03-18 NOTE — Progress Notes (Unsigned)
 Location:  [facility] Room Number: 111 Place of Service:  SNF (31)   CODE STATUS: full code   Allergies  Allergen Reactions   Gabapentin    Lisinopril    Penicillins Nausea Only    Has patient had a PCN reaction causing immediate rash, facial/tongue/throat swelling, SOB or lightheadedness with hypotension: Yes Has patient had a PCN reaction causing severe rash involving mucus membranes or skin necrosis: No Has patient had a PCN reaction that required hospitalization No Has patient had a PCN reaction occurring within the last 10 years: No If all of the above answers are "NO", then may proceed with Cephalosporin use.    Sulfa Antibiotics Nausea And Vomiting    Chief Complaint  Patient presents with   Acute Visit    Follow up labs     HPI:  Her renal function is slightly worse with creat 1.13 from 0.93. she is taking losartan and mobic; which can cause worsening renal function. She not voicing any complaints of pain. States that she is feeling good today.   Past Medical History:  Diagnosis Date   Anxiety    Bowel obstruction (HCC)    Burning with urination 01/04/2016   Diabetes mellitus with neuropathy    Genital herpes    GERD (gastroesophageal reflux disease)    Hematuria 01/04/2016   Herpes 12/22/2015   Hyperlipidemia    Hypertension    LLQ abdominal tenderness 01/04/2016   Neuropathy    Pelvic pressure in female 01/04/2016   Recurrent UTI    Sciatic nerve disease    Small bowel obstruction (HCC) 04/27/2016   Urinary frequency 01/04/2016   Urticaria     Past Surgical History:  Procedure Laterality Date   ABDOMINAL HYSTERECTOMY     APPLICATION OF INTRAOPERATIVE CT SCAN N/A 12/19/2018   Procedure: APPLICATION OF INTRAOPERATIVE CT SCAN;  Surgeon: Maeola Harman, MD;  Location: [facility] OR;  Service: Neurosurgery;  Laterality: N/A;   bowel obstruction     COLON SURGERY     blocked colon   POSTERIOR CERVICAL FUSION/FORAMINOTOMY N/A 12/19/2018    Procedure: Cervical Seven to Thoracic One Posterior cervical fusion;  Surgeon: Maeola Harman, MD;  Location: [facility] OR;  Service: Neurosurgery;  Laterality: N/A;  Cervical Seven to Thoracic One Posterior cervical fusion    Social History   Socioeconomic History   Marital status: Widowed    Spouse name: Not on file   Number of children: Not on file   Years of education: Not on file   Highest education level: Not on file  Occupational History   Occupation: retired   Tobacco Use   Smoking status: Never   Smokeless tobacco: Current    Types: Snuff  Vaping Use   Vaping status: Never Used  Substance and Sexual Activity   Alcohol use: No   Drug use: No   Sexual activity: Not Currently    Birth control/protection: Surgical    Comment: hyst  Other Topics Concern   Not on file  Social History Narrative   Long term resident of [facility]    Social Drivers of Health   Financial Resource Strain: Not on file  Food Insecurity: Not on file  Transportation Needs: Not on file  Physical Activity: Not on file  Stress: Not on file  Social Connections: Not on file  Intimate Partner Violence: Not on file   Family History  Problem Relation Age of Onset   Diabetes Mother    Heart disease Brother  Hypertension Brother    Diabetes Brother    Hypertension Daughter    Diabetes Brother    Diabetes Brother    Alzheimer's disease Brother    Anxiety disorder Daughter       VITAL SIGNS BP 130/70   Pulse 61   Temp (!) 97.4 F (36.3 C)   Resp 20   Ht 5\' 1"  (1.549 m)   Wt 208 lb 3.2 oz (94.4 kg)   SpO2 97%   BMI 39.34 kg/m   Outpatient Encounter Medications as of 03/18/2024  Medication Sig   acetaminophen (TYLENOL) 650 MG CR tablet Take 650 mg by mouth 3 (three) times daily.   amLODipine (NORVASC) 10 MG tablet Take 1 tablet (10 mg total) by mouth daily.   aspirin 81 MG chewable tablet Chew 81 mg by mouth daily.   busPIRone (BUSPAR) 5 MG tablet Take 5 mg by mouth 3 (three) times daily.    carvedilol (COREG) 6.25 MG tablet Take 6.25 mg by mouth 2 (two) times daily with a meal.   Cranberry 500 MG CAPS Take by mouth in the morning and at bedtime. For UTI prevention   diclofenac Sodium (VOLTAREN) 1 % GEL Apply topically 3 (three) times daily as needed.   DULoxetine (CYMBALTA) 60 MG capsule Take 60 mg by mouth daily. Pain management   Insulin Glargine (BASAGLAR KWIKPEN) 100 UNIT/ML Inject 10 Units into the skin at bedtime.   Insulin Pen Needle (BD AUTOSHIELD DUO) 30G X 5 MM MISC by Does not apply route. 3/16"   lamoTRIgine (LAMICTAL) 25 MG tablet Take 25 mg by mouth daily.   linagliptin (TRADJENTA) 5 MG TABS tablet Take 5 mg by mouth daily.   loratadine (CLARITIN) 10 MG tablet Take 10 mg by mouth daily.   losartan (COZAAR) 50 MG tablet Take 50 mg by mouth daily.   Melatonin 5 MG TABS Take 5 mg by mouth at bedtime.   meloxicam (MOBIC) 15 MG tablet Take 15 mg by mouth daily. Hip Pain   metFORMIN (GLUCOPHAGE) 1000 MG tablet Take 1,000 mg by mouth 2 (two) times daily with a meal.    mirabegron ER (MYRBETRIQ) 50 MG TB24 tablet Take 50 mg by mouth daily.   montelukast (SINGULAIR) 10 MG tablet Take 10 mg by mouth daily.   mupirocin ointment (BACTROBAN) 2 % 1 Application 2 (two) times daily.   NON FORMULARY Diet: Regular diet   omeprazole (PRILOSEC) 20 MG capsule Take 20 mg by mouth daily.   polyethylene glycol (MIRALAX / GLYCOLAX) packet Take 17 g by mouth daily.   pregabalin (LYRICA) 25 MG capsule Take 1 capsule (25 mg total) by mouth 3 (three) times daily.   senna-docusate (SENOKOT-S) 8.6-50 MG tablet Take 1 tablet by mouth at bedtime.    sertraline (ZOLOFT) 50 MG tablet Take 50 mg by mouth daily.   simvastatin (ZOCOR) 10 MG tablet Take 10 mg by mouth at bedtime.    spironolactone (ALDACTONE) 25 MG tablet Take 25 mg by mouth daily.   triamcinolone (NASACORT) 55 MCG/ACT AERO nasal inhaler Place 1 spray into the nose daily.   valACYclovir (VALTREX) 500 MG tablet Take 500 mg by mouth  daily.    No facility-administered encounter medications on file as of 03/18/2024.     SIGNIFICANT DIAGNOSTIC EXAMS  LABS REVIEWED PREVIOUS;     03-02-23: glucose 133; bun 23; creat 0.83; k+ 3.5; na++ 134; ca 9.1 gfr >60 03-23-23: tsh 5.410; vitamin B 12: 806   04-05-23: wbc 19.9; hgb 11.1; hct  34.6; mcv 78.8 plt 306; glucose 137; bun 48; creat 1.11; k+ 3.0; na++ 133; ca 9.0; gfr 50  04-06-23; wbc 19.8; hgb 11.1; hct 35.3; mcv 81.0 plt 312; glucose 95; bun 40; creat 1.21; k+ 4.0; na++ 137; ca 9.2; gfr 45  04-10-23: wbc 14.6; hgb 10.7; hct 34.2; mcv 79.4 plt 236; glucose 106; bun 21; creat 0.84; k+ 3.2; na++ 132; ca 8.7 gfr >60 04-13-23: wbc 10.6; hgb 10.0; hct 32.3; mcv 82.2 plt 219; glucose 96; bun 22; creat 0.92; k+ 3.0; na++ 136; ca 8.5; gfr >60 04-17-23: k+ 4.1 06-01-23: urine microalbumin 7.8 06-22-23: glucose 110; bun 28; creat 0.97; k+ 4.1; na++ 130; ca 8.8; gfr 59 hgb A1c 6.8 11-27-23: glucose 172; bun 16; creat 0.73; k+ 3.8; na++ 135; ca 8.6 gfr >60    TODAY  03-18-24: wbc 10.8; hgb 11.1; hct 36.2; mcv 81.0 plt 258; glucose 173; bun 24; creat 1.13 k+ 4.7; na++ 130; ca 9.3; gfr 49; protein 7.2 albumin 3.8; hgb A1c 9.4    Review of Systems  Constitutional:  Negative for malaise/fatigue.  Respiratory:  Negative for cough and shortness of breath.   Cardiovascular:  Negative for chest pain, palpitations and leg swelling.  Gastrointestinal:  Negative for abdominal pain, constipation and heartburn.  Musculoskeletal:  Negative for back pain, joint pain and myalgias.  Skin: Negative.   Neurological:  Negative for dizziness.  Psychiatric/Behavioral:  The patient is not nervous/anxious.      Physical Exam Constitutional:      General: She is not in acute distress.    Appearance: She is well-developed. She is obese. She is not diaphoretic.  Neck:     Thyroid: No thyromegaly.  Cardiovascular:     Rate and Rhythm: Normal rate and regular rhythm.     Pulses: Normal pulses.     Heart sounds:  Normal heart sounds.  Pulmonary:     Effort: Pulmonary effort is normal. No respiratory distress.     Breath sounds: Normal breath sounds.  Abdominal:     General: Bowel sounds are normal. There is no distension.     Palpations: Abdomen is soft.     Tenderness: There is no abdominal tenderness.  Musculoskeletal:        General: Normal range of motion.     Cervical back: Neck supple.     Right lower leg: No edema.     Left lower leg: No edema.  Lymphadenopathy:     Cervical: No cervical adenopathy.  Skin:    General: Skin is warm and dry.  Neurological:     Mental Status: She is alert. Mental status is at baseline.     Comments: SLUMS 6/30     Psychiatric:        Mood and Affect: Mood normal.     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    Synthia Innocent NP [facility]   call [phone]

## 2024-03-19 ENCOUNTER — Encounter: Payer: Self-pay | Admitting: Adult Health

## 2024-03-19 ENCOUNTER — Non-Acute Institutional Stay (SKILLED_NURSING_FACILITY): Payer: Self-pay | Admitting: Adult Health

## 2024-03-19 DIAGNOSIS — E1151 Type 2 diabetes mellitus with diabetic peripheral angiopathy without gangrene: Secondary | ICD-10-CM | POA: Diagnosis not present

## 2024-03-19 NOTE — Progress Notes (Signed)
 Location:  [facility] Room Number: 109 Place of Service:  SNF (31)   CODE STATUS: full   Allergies  Allergen Reactions   Gabapentin    Lisinopril    Penicillins Nausea Only    Has patient had a PCN reaction causing immediate rash, facial/tongue/throat swelling, SOB or lightheadedness with hypotension: Yes Has patient had a PCN reaction causing severe rash involving mucus membranes or skin necrosis: No Has patient had a PCN reaction that required hospitalization No Has patient had a PCN reaction occurring within the last 10 years: No If all of the above answers are "NO", then may proceed with Cephalosporin use.    Sulfa Antibiotics Nausea And Vomiting    Chief Complaint  Patient presents with   Acute Visit    Follow up labs     HPI:  Her hgb A1c is 9.4. her goal reading is <8. She is presently taking basaglar 10 units and tradjenta  mg daily; metformin 1 gm twice daily. Her weight is without significant change.   Past Medical History:  Diagnosis Date   Anxiety    Bowel obstruction (HCC)    Burning with urination 01/04/2016   Diabetes mellitus with neuropathy    Genital herpes    GERD (gastroesophageal reflux disease)    Hematuria 01/04/2016   Herpes 12/22/2015   Hyperlipidemia    Hypertension    LLQ abdominal tenderness 01/04/2016   Neuropathy    Pelvic pressure in female 01/04/2016   Recurrent UTI    Sciatic nerve disease    Small bowel obstruction (HCC) 04/27/2016   Urinary frequency 01/04/2016   Urticaria     Past Surgical History:  Procedure Laterality Date   ABDOMINAL HYSTERECTOMY     APPLICATION OF INTRAOPERATIVE CT SCAN N/A 12/19/2018   Procedure: APPLICATION OF INTRAOPERATIVE CT SCAN;  Surgeon: Manya Sells, MD;  Location: [facility] OR;  Service: Neurosurgery;  Laterality: N/A;   bowel obstruction     COLON SURGERY     blocked colon   POSTERIOR CERVICAL FUSION/FORAMINOTOMY N/A 12/19/2018   Procedure: Cervical Seven to Thoracic One  Posterior cervical fusion;  Surgeon: Manya Sells, MD;  Location: [facility] OR;  Service: Neurosurgery;  Laterality: N/A;  Cervical Seven to Thoracic One Posterior cervical fusion    Social History   Socioeconomic History   Marital status: Widowed    Spouse name: Not on file   Number of children: Not on file   Years of education: Not on file   Highest education level: Not on file  Occupational History   Occupation: retired   Tobacco Use   Smoking status: Never   Smokeless tobacco: Current    Types: Snuff  Vaping Use   Vaping status: Never Used  Substance and Sexual Activity   Alcohol use: No   Drug use: No   Sexual activity: Not Currently    Birth control/protection: Surgical    Comment: hyst  Other Topics Concern   Not on file  Social History Narrative   Long term resident of [facility]    Social Drivers of Health   Financial Resource Strain: Not on file  Food Insecurity: Not on file  Transportation Needs: Not on file  Physical Activity: Not on file  Stress: Not on file  Social Connections: Not on file  Intimate Partner Violence: Not on file   Family History  Problem Relation Age of Onset   Diabetes Mother    Heart disease Brother    Hypertension Brother  Diabetes Brother    Hypertension Daughter    Diabetes Brother    Diabetes Brother    Alzheimer's disease Brother    Anxiety disorder Daughter       VITAL SIGNS BP (!) 118/59   Pulse 70   Temp 97.8 F (36.6 C)   Resp 20   Ht 5\' 1"  (1.549 m)   Wt 208 lb 3.2 oz (94.4 kg)   SpO2 97%   BMI 39.34 kg/m   Outpatient Encounter Medications as of 03/19/2024  Medication Sig   acetaminophen (TYLENOL) 650 MG CR tablet Take 650 mg by mouth 3 (three) times daily.   amLODipine (NORVASC) 10 MG tablet Take 1 tablet (10 mg total) by mouth daily.   aspirin 81 MG chewable tablet Chew 81 mg by mouth daily.   busPIRone (BUSPAR) 5 MG tablet Take 5 mg by mouth 3 (three) times daily.   carvedilol (COREG) 6.25 MG tablet Take 6.25 mg  by mouth 2 (two) times daily with a meal.   Cranberry 500 MG CAPS Take by mouth in the morning and at bedtime. For UTI prevention   diclofenac Sodium (VOLTAREN) 1 % GEL Apply topically 3 (three) times daily as needed.   DULoxetine (CYMBALTA) 60 MG capsule Take 60 mg by mouth daily. Pain management   Insulin Glargine (BASAGLAR KWIKPEN) 100 UNIT/ML Inject 10 Units into the skin at bedtime.   Insulin Pen Needle (BD AUTOSHIELD DUO) 30G X 5 MM MISC by Does not apply route. 3/16"   lamoTRIgine (LAMICTAL) 25 MG tablet Take 25 mg by mouth daily.   linagliptin (TRADJENTA) 5 MG TABS tablet Take 5 mg by mouth daily.   loratadine (CLARITIN) 10 MG tablet Take 10 mg by mouth daily.   losartan (COZAAR) 50 MG tablet Take 50 mg by mouth daily.   Melatonin 5 MG TABS Take 5 mg by mouth at bedtime.   meloxicam (MOBIC) 7.5 MG tablet Take 7.5 mg by mouth daily.   metFORMIN (GLUCOPHAGE) 1000 MG tablet Take 1,000 mg by mouth 2 (two) times daily with a meal.    mirabegron ER (MYRBETRIQ) 50 MG TB24 tablet Take 50 mg by mouth daily.   montelukast (SINGULAIR) 10 MG tablet Take 10 mg by mouth daily.   mupirocin ointment (BACTROBAN) 2 % 1 Application 2 (two) times daily.   NON FORMULARY Diet: Regular diet   omeprazole (PRILOSEC) 20 MG capsule Take 20 mg by mouth daily.   polyethylene glycol (MIRALAX / GLYCOLAX) packet Take 17 g by mouth daily.   pregabalin (LYRICA) 25 MG capsule Take 1 capsule (25 mg total) by mouth 3 (three) times daily.   senna-docusate (SENOKOT-S) 8.6-50 MG tablet Take 1 tablet by mouth at bedtime.    sertraline (ZOLOFT) 50 MG tablet Take 50 mg by mouth daily.   simvastatin (ZOCOR) 10 MG tablet Take 10 mg by mouth at bedtime.    spironolactone (ALDACTONE) 25 MG tablet Take 25 mg by mouth daily.   triamcinolone (NASACORT) 55 MCG/ACT AERO nasal inhaler Place 1 spray into the nose daily.   valACYclovir (VALTREX) 500 MG tablet Take 500 mg by mouth daily.    No facility-administered encounter medications  on file as of 03/19/2024.     SIGNIFICANT DIAGNOSTIC EXAMS   LABS REVIEWED PREVIOUS;     03-02-23: glucose 133; bun 23; creat 0.83; k+ 3.5; na++ 134; ca 9.1 gfr >60 03-23-23: tsh 5.410; vitamin B 12: 806   04-05-23: wbc 19.9; hgb 11.1; hct 34.6; mcv 78.8 plt 306; glucose  137; bun 48; creat 1.11; k+ 3.0; na++ 133; ca 9.0; gfr 50  04-06-23; wbc 19.8; hgb 11.1; hct 35.3; mcv 81.0 plt 312; glucose 95; bun 40; creat 1.21; k+ 4.0; na++ 137; ca 9.2; gfr 45  04-10-23: wbc 14.6; hgb 10.7; hct 34.2; mcv 79.4 plt 236; glucose 106; bun 21; creat 0.84; k+ 3.2; na++ 132; ca 8.7 gfr >60 04-13-23: wbc 10.6; hgb 10.0; hct 32.3; mcv 82.2 plt 219; glucose 96; bun 22; creat 0.92; k+ 3.0; na++ 136; ca 8.5; gfr >60 04-17-23: k+ 4.1 06-01-23: urine microalbumin 7.8 06-22-23: glucose 110; bun 28; creat 0.97; k+ 4.1; na++ 130; ca 8.8; gfr 59 hgb A1c 6.8 11-27-23: glucose 172; bun 16; creat 0.73; k+ 3.8; na++ 135; ca 8.6 gfr >60    TODAY  03-18-24: wbc 10.8; hgb 11.1; hct 36.2; mcv 81.0 plt 258; glucose 173; bun 24; creat 1.13 k+ 4.7; na++ 130; ca 9.3; gfr 49; protein 7.2 albumin 3.8; hgb A1c 9.4     Review of Systems  Constitutional:  Negative for malaise/fatigue.  Respiratory:  Negative for cough and shortness of breath.   Cardiovascular:  Negative for chest pain, palpitations and leg swelling.  Gastrointestinal:  Negative for abdominal pain, constipation and heartburn.  Musculoskeletal:  Negative for back pain, joint pain and myalgias.  Skin: Negative.   Neurological:  Negative for dizziness.  Psychiatric/Behavioral:  The patient is not nervous/anxious.    Physical Exam Constitutional:      General: She is not in acute distress.    Appearance: She is well-developed. She is obese. She is not diaphoretic.  Neck:     Thyroid: No thyromegaly.  Cardiovascular:     Rate and Rhythm: Normal rate and regular rhythm.     Heart sounds: Normal heart sounds.  Pulmonary:     Effort: Pulmonary effort is normal. No  respiratory distress.     Breath sounds: Normal breath sounds.  Abdominal:     General: Bowel sounds are normal. There is no distension.     Palpations: Abdomen is soft.     Tenderness: There is no abdominal tenderness.  Musculoskeletal:        General: Normal range of motion.     Cervical back: Neck supple.     Right lower leg: No edema.     Left lower leg: No edema.  Lymphadenopathy:     Cervical: No cervical adenopathy.  Skin:    General: Skin is warm and dry.  Neurological:     Mental Status: She is alert. Mental status is at baseline.     Comments: SLUMS 6/30  Psychiatric:        Mood and Affect: Mood normal.       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 Will continue metformin and tradjenta.     Britt Candle NP [facility]  call [phone]

## 2024-03-25 NOTE — Assessment & Plan Note (Signed)
 For hgb A1c of 9.4 will begin basaglar 15 units nightly and lispro 5 units with meals. Will continue metformin and tradjenta.

## 2024-03-31 DIAGNOSIS — M85861 Other specified disorders of bone density and structure, right lower leg: Secondary | ICD-10-CM | POA: Diagnosis not present

## 2024-03-31 DIAGNOSIS — M1611 Unilateral primary osteoarthritis, right hip: Secondary | ICD-10-CM | POA: Diagnosis not present

## 2024-03-31 DIAGNOSIS — M79604 Pain in right leg: Secondary | ICD-10-CM | POA: Diagnosis not present

## 2024-03-31 DIAGNOSIS — M1711 Unilateral primary osteoarthritis, right knee: Secondary | ICD-10-CM | POA: Diagnosis not present

## 2024-04-01 ENCOUNTER — Other Ambulatory Visit (HOSPITAL_COMMUNITY)
Admission: RE | Admit: 2024-04-01 | Discharge: 2024-04-01 | Disposition: A | Discharge status: Home / Self Care | Source: Skilled Nursing Facility | Attending: Adult Health | Admitting: Adult Health

## 2024-04-01 DIAGNOSIS — I152 Hypertension secondary to endocrine disorders: Secondary | ICD-10-CM | POA: Insufficient documentation

## 2024-04-01 LAB — BASIC METABOLIC PANEL WITH GFR
Anion gap: 10 (ref 5–15)
BUN: 21 mg/dL (ref 8–23)
CO2: 25 mmol/L (ref 22–32)
Calcium: 9.1 mg/dL (ref 8.9–10.3)
Chloride: 97 mmol/L — ABNORMAL LOW (ref 98–111)
Creatinine, Ser: 1.02 mg/dL — ABNORMAL HIGH (ref 0.44–1.00)
GFR, Estimated: 55 mL/min — ABNORMAL LOW (ref >60)
Glucose, Bld: 174 mg/dL — ABNORMAL HIGH (ref 70–99)
Potassium: 4.4 mmol/L (ref 3.5–5.1)
Sodium: 132 mmol/L — ABNORMAL LOW (ref 135–145)

## 2024-04-02 ENCOUNTER — Other Ambulatory Visit: Payer: Self-pay | Admitting: Adult Health

## 2024-04-02 MED ORDER — PREGABALIN 25 MG PO CAPS: 25.0000 mg | ORAL_CAPSULE | Freq: Three times a day (TID) | ORAL | 0 refills | Status: DC

## 2024-04-08 ENCOUNTER — Encounter: Payer: Self-pay | Admitting: Adult Health

## 2024-04-08 ENCOUNTER — Non-Acute Institutional Stay (SKILLED_NURSING_FACILITY): Payer: Self-pay | Admitting: Adult Health

## 2024-04-08 DIAGNOSIS — K219 Gastro-esophageal reflux disease without esophagitis: Secondary | ICD-10-CM

## 2024-04-08 DIAGNOSIS — N3281 Overactive bladder: Secondary | ICD-10-CM

## 2024-04-08 DIAGNOSIS — M5432 Sciatica, left side: Secondary | ICD-10-CM

## 2024-04-08 DIAGNOSIS — G8929 Other chronic pain: Secondary | ICD-10-CM

## 2024-04-08 NOTE — Progress Notes (Signed)
 Location:  [facility] Room Number: 112 Place of Service:  SNF (31)   CODE STATUS: full   Allergies  Allergen Reactions   Gabapentin     Lisinopril    Penicillins Nausea Only    Has patient had a PCN reaction causing immediate rash, facial/tongue/throat swelling, SOB or lightheadedness with hypotension: Yes Has patient had a PCN reaction causing severe rash involving mucus membranes or skin necrosis: No Has patient had a PCN reaction that required hospitalization No Has patient had a PCN reaction occurring within the last 10 years: No If all of the above answers are "NO", then may proceed with Cephalosporin use.    Sulfa Antibiotics Nausea And Vomiting    Chief Complaint  Patient presents with   Medical Management of Chronic Issues        Gastroesophageal reflux disease without esophagitis:  OAB:  Chronic generalized pain/left sciatic pain     HPI:  She is a 83 y.o. long term resident of this facility being seen for the management of her chronic illnesses:Gastroesophageal reflux disease without esophagitis:  OAB:  Chronic generalized pain/left sciatic pain. There are no reports of uncontrolled pain. She has not had any further chest pain since restarting prilosec. Her weight remains stable.    Past Medical History:  Diagnosis Date   Anxiety    Bowel obstruction (HCC)    Burning with urination 01/04/2016   Diabetes mellitus with neuropathy    Genital herpes    GERD (gastroesophageal reflux disease)    Hematuria 01/04/2016   Herpes 12/22/2015   Hyperlipidemia    Hypertension    LLQ abdominal tenderness 01/04/2016   Neuropathy    Pelvic pressure in female 01/04/2016   Recurrent UTI    Sciatic nerve disease    Small bowel obstruction (HCC) 04/27/2016   Urinary frequency 01/04/2016   Urticaria     Past Surgical History:  Procedure Laterality Date   ABDOMINAL HYSTERECTOMY     APPLICATION OF INTRAOPERATIVE CT SCAN N/A 12/19/2018   Procedure:  APPLICATION OF INTRAOPERATIVE CT SCAN;  Surgeon: Manya Sells, MD;  Location: [facility] OR;  Service: Neurosurgery;  Laterality: N/A;   bowel obstruction     COLON SURGERY     blocked colon   POSTERIOR CERVICAL FUSION/FORAMINOTOMY N/A 12/19/2018   Procedure: Cervical Seven to Thoracic One Posterior cervical fusion;  Surgeon: Manya Sells, MD;  Location: [facility] OR;  Service: Neurosurgery;  Laterality: N/A;  Cervical Seven to Thoracic One Posterior cervical fusion    Social History   Socioeconomic History   Marital status: Widowed    Spouse name: Not on file   Number of children: Not on file   Years of education: Not on file   Highest education level: Not on file  Occupational History   Occupation: retired   Tobacco Use   Smoking status: Never   Smokeless tobacco: Current    Types: Snuff  Vaping Use   Vaping status: Never Used  Substance and Sexual Activity   Alcohol use: No   Drug use: No   Sexual activity: Not Currently    Birth control/protection: Surgical    Comment: hyst  Other Topics Concern   Not on file  Social History Narrative   Long term resident of [facility]    Social Drivers of Health   Financial Resource Strain: Not on file  Food Insecurity: Not on file  Transportation Needs: Not on file  Physical Activity: Not on file  Stress: Not on file  Social Connections: Not on file  Intimate Partner Violence: Not on file   Family History  Problem Relation Age of Onset   Diabetes Mother    Heart disease Brother    Hypertension Brother    Diabetes Brother    Hypertension Daughter    Diabetes Brother    Diabetes Brother    Alzheimer's disease Brother    Anxiety disorder Daughter       VITAL SIGNS BP 138/74   Pulse 72   Temp (!) 97.4 F (36.3 C)   Resp (!) 24   Ht 5\' 1"  (1.549 m)   Wt 208 lb 3.2 oz (94.4 kg)   SpO2 97%   BMI 39.34 kg/m   Outpatient Encounter Medications as of 04/08/2024  Medication Sig   acetaminophen  (TYLENOL ) 650 MG CR tablet Take 650 mg by  mouth 3 (three) times daily.   amLODipine  (NORVASC ) 10 MG tablet Take 1 tablet (10 mg total) by mouth daily.   aspirin  81 MG chewable tablet Chew 81 mg by mouth daily.   busPIRone (BUSPAR) 5 MG tablet Take 5 mg by mouth 3 (three) times daily.   carvedilol (COREG) 6.25 MG tablet Take 6.25 mg by mouth 2 (two) times daily with a meal.   Cranberry 500 MG CAPS Take by mouth in the morning and at bedtime. For UTI prevention   diclofenac Sodium (VOLTAREN) 1 % GEL Apply topically 3 (three) times daily as needed.   DULoxetine  (CYMBALTA ) 60 MG capsule Take 60 mg by mouth daily. Pain management   Insulin  Glargine (BASAGLAR  KWIKPEN) 100 UNIT/ML Inject 15 Units into the skin at bedtime.   insulin  lispro (HUMALOG) 100 UNIT/ML injection Inject 5 Units into the skin 3 (three) times daily before meals.   Insulin  Pen Needle (BD AUTOSHIELD DUO) 30G X 5 MM MISC by Does not apply route. 3/16"   lamoTRIgine  (LAMICTAL ) 25 MG tablet Take 25 mg by mouth daily.   linagliptin  (TRADJENTA ) 5 MG TABS tablet Take 5 mg by mouth daily.   loratadine  (CLARITIN ) 10 MG tablet Take 10 mg by mouth daily.   losartan (COZAAR) 50 MG tablet Take 50 mg by mouth daily.   Melatonin 5 MG TABS Take 5 mg by mouth at bedtime.   meloxicam (MOBIC) 7.5 MG tablet Take 7.5 mg by mouth daily.   metFORMIN  (GLUCOPHAGE ) 1000 MG tablet Take 1,000 mg by mouth 2 (two) times daily with a meal.    mirabegron ER (MYRBETRIQ) 50 MG TB24 tablet Take 50 mg by mouth daily.   montelukast  (SINGULAIR ) 10 MG tablet Take 10 mg by mouth daily.   mupirocin  ointment (BACTROBAN ) 2 % 1 Application 2 (two) times daily.   NON FORMULARY Diet: Regular diet   omeprazole (PRILOSEC) 20 MG capsule Take 20 mg by mouth daily.   polyethylene glycol (MIRALAX  / GLYCOLAX ) packet Take 17 g by mouth daily.   pregabalin  (LYRICA ) 25 MG capsule Take 1 capsule (25 mg total) by mouth 3 (three) times daily.   senna-docusate (SENOKOT-S) 8.6-50 MG tablet Take 1 tablet by mouth at bedtime.     sertraline  (ZOLOFT ) 50 MG tablet Take 50 mg by mouth daily.   simvastatin  (ZOCOR ) 10 MG tablet Take 10 mg by mouth at bedtime.    spironolactone (ALDACTONE) 25 MG tablet Take 25 mg by mouth daily.   triamcinolone  (NASACORT ) 55 MCG/ACT AERO nasal inhaler Place 1 spray into the nose daily.   valACYclovir  (VALTREX ) 500 MG tablet Take 500 mg by mouth daily.  No facility-administered encounter medications on file as of 04/08/2024.     SIGNIFICANT DIAGNOSTIC EXAMS  LABS REVIEWED PREVIOUS;     03-23-23: tsh 5.410; vitamin B 12: 806   04-05-23: wbc 19.9; hgb 11.1; hct 34.6; mcv 78.8 plt 306; glucose 137; bun 48; creat 1.11; k+ 3.0; na++ 133; ca 9.0; gfr 50  04-06-23; wbc 19.8; hgb 11.1; hct 35.3; mcv 81.0 plt 312; glucose 95; bun 40; creat 1.21; k+ 4.0; na++ 137; ca 9.2; gfr 45  04-10-23: wbc 14.6; hgb 10.7; hct 34.2; mcv 79.4 plt 236; glucose 106; bun 21; creat 0.84; k+ 3.2; na++ 132; ca 8.7 gfr >60 04-13-23: wbc 10.6; hgb 10.0; hct 32.3; mcv 82.2 plt 219; glucose 96; bun 22; creat 0.92; k+ 3.0; na++ 136; ca 8.5; gfr >60 04-17-23: k+ 4.1 06-01-23: urine microalbumin 7.8 06-22-23: glucose 110; bun 28; creat 0.97; k+ 4.1; na++ 130; ca 8.8; gfr 59 hgb A1c 6.8 11-27-23: glucose 172; bun 16; creat 0.73; k+ 3.8; na++ 135; ca 8.6 gfr >60    TODAY  03-18-24: wbc 10.8; hgb 11.1; hct 36.2; mcv 81.0 plt 258; glucose 173; bun 24; creat 1.13 k+ 4.7; na++ 130; ca 9.3; gfr 49; protein 7.2 albumin 3.8; hgb A1c 9.4   04-01-24: glucose 174; bun 21; creat 1.02; k+ 4.4; na++ 132; ca 9.1; gfr 55   Review of Systems  Constitutional:  Negative for malaise/fatigue.  Respiratory:  Negative for cough and shortness of breath.   Cardiovascular:  Negative for chest pain, palpitations and leg swelling.  Gastrointestinal:  Negative for abdominal pain, constipation and heartburn.  Musculoskeletal:  Negative for back pain, joint pain and myalgias.  Skin: Negative.   Neurological:  Negative for dizziness.  Psychiatric/Behavioral:   The patient is not nervous/anxious.    Physical Exam Constitutional:      General: She is not in acute distress.    Appearance: She is well-developed. She is obese. She is not diaphoretic.  Neck:     Thyroid : No thyromegaly.  Cardiovascular:     Rate and Rhythm: Normal rate and regular rhythm.     Pulses: Normal pulses.     Heart sounds: Normal heart sounds.  Pulmonary:     Effort: Pulmonary effort is normal. No respiratory distress.     Breath sounds: Normal breath sounds.  Abdominal:     General: Bowel sounds are normal. There is no distension.     Palpations: Abdomen is soft.     Tenderness: There is no abdominal tenderness.  Musculoskeletal:        General: Normal range of motion.     Cervical back: Neck supple.     Right lower leg: No edema.     Left lower leg: No edema.  Lymphadenopathy:     Cervical: No cervical adenopathy.  Skin:    General: Skin is warm and dry.  Neurological:     Mental Status: She is alert. Mental status is at baseline.     Comments: SLUMS 6/30   Psychiatric:        Mood and Affect: Mood normal.      ASSESSMENT/ PLAN:  TODAY  Gastroesophageal reflux disease without esophagitis: will continue prilosec 20 mg daily; did not tolerate being off this medication  2. OAB: will continue myrbetriq 50 mg daily   3. Chronic generalized pain/left sciatic pain: will continue tylenol  650 mg three times daily lyrica  25 mg three times daily; cymbalta  60 mg daily and voltaren gel three times daily as needed  PREVIOUS   4. Chronic non-seasonal allergic rhinitis: will continue claritin  10 mg daily; nasocaort daily  5. Herpes: no recent outbreak; will continue valtrex  500 mg daily   6. Vitamin B 12 deficiency: level is 806  7. Chronic constipation: will continue miralax  daily and senna s daily   8. Hypertension associated with type 2 diabetes mellitus: b/p 138/74 will continue norvasc  10 mg daily cozaar 50 mg daily; coreg 6.25 mg twice daily   9.  Major depression recurrent chronic: will  continue cymbalta  60 mg daily (failed one wean; also takes for pain management); will continue lamictal  25 mg daily to stabilize mood; zoloft  50 mg daily and buspar 5 mg three times daily for anxiety.   10. CKD stage 3 due to type 2 diabetes mellitus: bun 21; creat 1.02; gfr 55  11. Major neurocognitive deficits: SLUMS 6/30  12. Normocytic anemia: hgb 10.0; hct 32.3   13. Aortic atherosclerosis (ct 12-18-19) is on asa and statin  14. Dyslipidemia associated with type 2 diabetes mellitus: LDL 55 will continue zocor  10 mg daily   15. Type 2 diabetes mellitus with peripheral angiopathy: hgb A1c 6.8 will continue metformin  1 gm twice daily; tradjenta  5 mg daily semglee  10 units nightly; humalog 5 units with meals  is on asa statin, arb   Britt Candle NP [facility]  call [phone]

## 2024-04-10 ENCOUNTER — Encounter: Payer: Self-pay | Admitting: Adult Health

## 2024-04-10 ENCOUNTER — Non-Acute Institutional Stay (SKILLED_NURSING_FACILITY): Payer: Self-pay | Admitting: Adult Health

## 2024-04-10 DIAGNOSIS — Z0000 Encounter for general adult medical examination without abnormal findings: Secondary | ICD-10-CM | POA: Diagnosis not present

## 2024-04-10 NOTE — Patient Instructions (Signed)
  [name] [name] , Thank you for taking time to come for your Medicare Wellness Visit. I appreciate your ongoing commitment to your health goals. Please review the following plan we discussed and let me know if I can assist you in the future.   These are the goals we discussed:  Goals      Absence of Fall and Fall-Related Injury     Evidence-based guidance:  Assess fall risk using a validated tool when available. Consider balance and gait impairment, muscle weakness, diminished vision or hearing, environmental hazards, presence of urinary or bowel urgency and/or incontinence.  Communicate fall injury risk to interprofessional healthcare team.  Develop a fall prevention plan with the patient and family.  Promote use of personal vision and auditory aids.  Promote reorientation, appropriate sensory stimulation, and routines to decrease risk of fall when changes in mental status are present.  Assess assistance level required for safe and effective self-care; consider referral for home care.  Encourage physical activity, such as performance of self-care at highest level of ability, strength and balance exercise program, and provision of appropriate assistive devices; refer to rehabilitation therapy.  Refer to community-based fall prevention program where available.  If fall occurs, determine the cause and revise fall injury prevention plan.  Regularly review medication contribution to fall risk; consider risk related to polypharmacy and age.  Refer to pharmacist for consultation when concerns about medications are revealed.  Balance adequate pain management with potential for oversedation.  Provide guidance related to environmental modifications.  Consider supplementation with Vitamin D.   Notes:      Follow up with Primary Care Provider     General - Client will not be readmitted within 30 days (C-SNP)        This is a list of the screening recommended for you and due dates:  Health Maintenance   Topic Date Due   Complete foot exam   05/30/2024   Yearly kidney health urinalysis for diabetes  05/31/2024   Flu Shot  07/12/2024   COVID-19 Vaccine (8 - 2024-25 season) 09/11/2024   Hemoglobin A1C  09/17/2024   Eye exam for diabetics  11/27/2024   Yearly kidney function blood test for diabetes  04/01/2025   Medicare Annual Wellness Visit  04/10/2025   DTaP/Tdap/Td vaccine (2 - Td or Tdap) 10/14/2031   Pneumonia Vaccine  Completed   DEXA scan (bone density measurement)  Completed   Zoster (Shingles) Vaccine  Completed   HPV Vaccine  Aged Out   Meningitis B Vaccine  Aged Out   Hepatitis C Screening  Discontinued

## 2024-04-10 NOTE — Progress Notes (Signed)
 Subjective:   [name] [name] is a 83 y.o. female who presents for Medicare Annual (Subsequent) preventive examination.  Visit Complete: In person  Patient Medicare AWV questionnaire was completed by the patient on 04-10-24; I have confirmed that all information answered by patient is correct and no changes since this date.  Cardiac Risk Factors include: advanced age (>44men, >39 women);diabetes mellitus;hypertension;obesity (BMI >30kg/m2);sedentary lifestyle     Objective:    Today's Vitals   04/10/24 1018  BP: 122/75  Pulse: 72  Resp: 20  Temp: (!) 97.5 F (36.4 C)  SpO2: 97%  Weight: 208 lb 3.2 oz (94.4 kg)  Height: 5\' 1"  (1.549 m)   Body mass index is 39.34 kg/m.     09/26/2023    8:29 AM 09/01/2023    8:43 AM 08/31/2023    1:28 PM 05/31/2023    9:02 AM 04/10/2023   10:39 AM 04/06/2023   11:00 AM 04/04/2023    9:16 AM  Advanced Directives  Does Patient Have a Medical Advance Directive? No No No No No No No  Would patient like information on creating a medical advance directive?   No - Patient declined  No - Patient declined No - Patient declined No - Patient declined    Current Medications (verified) Outpatient Encounter Medications as of 04/10/2024  Medication Sig   acetaminophen  (TYLENOL ) 650 MG CR tablet Take 650 mg by mouth 3 (three) times daily.   amLODipine  (NORVASC ) 10 MG tablet Take 1 tablet (10 mg total) by mouth daily.   aspirin  81 MG chewable tablet Chew 81 mg by mouth daily.   busPIRone (BUSPAR) 5 MG tablet Take 5 mg by mouth 3 (three) times daily.   carvedilol (COREG) 6.25 MG tablet Take 6.25 mg by mouth 2 (two) times daily with a meal.   Cranberry 500 MG CAPS Take by mouth in the morning and at bedtime. For UTI prevention   diclofenac Sodium (VOLTAREN) 1 % GEL Apply topically 3 (three) times daily as needed.   DULoxetine  (CYMBALTA ) 60 MG capsule Take 60 mg by mouth daily. Pain management   Insulin  Glargine (BASAGLAR  KWIKPEN) 100 UNIT/ML Inject 15  Units into the skin at bedtime.   insulin  lispro (HUMALOG) 100 UNIT/ML injection Inject 5 Units into the skin 3 (three) times daily before meals.   Insulin  Pen Needle (BD AUTOSHIELD DUO) 30G X 5 MM MISC by Does not apply route. 3/16"   lamoTRIgine  (LAMICTAL ) 25 MG tablet Take 25 mg by mouth daily.   linagliptin  (TRADJENTA ) 5 MG TABS tablet Take 5 mg by mouth daily.   loratadine  (CLARITIN ) 10 MG tablet Take 10 mg by mouth daily.   losartan (COZAAR) 50 MG tablet Take 50 mg by mouth daily.   Melatonin 5 MG TABS Take 5 mg by mouth at bedtime.   meloxicam (MOBIC) 7.5 MG tablet Take 7.5 mg by mouth daily.   metFORMIN  (GLUCOPHAGE ) 1000 MG tablet Take 1,000 mg by mouth 2 (two) times daily with a meal.    mirabegron ER (MYRBETRIQ) 50 MG TB24 tablet Take 50 mg by mouth daily.   montelukast  (SINGULAIR ) 10 MG tablet Take 10 mg by mouth daily.   mupirocin  ointment (BACTROBAN ) 2 % 1 Application 2 (two) times daily.   NON FORMULARY Diet: Regular diet   omeprazole (PRILOSEC) 20 MG capsule Take 20 mg by mouth daily.   polyethylene glycol (MIRALAX  / GLYCOLAX ) packet Take 17 g by mouth daily.   pregabalin  (LYRICA ) 25 MG capsule Take 1 capsule (  25 mg total) by mouth 3 (three) times daily.   senna-docusate (SENOKOT-S) 8.6-50 MG tablet Take 1 tablet by mouth at bedtime.    sertraline  (ZOLOFT ) 50 MG tablet Take 50 mg by mouth daily.   simvastatin  (ZOCOR ) 10 MG tablet Take 10 mg by mouth at bedtime.    spironolactone (ALDACTONE) 25 MG tablet Take 25 mg by mouth daily.   triamcinolone  (NASACORT ) 55 MCG/ACT AERO nasal inhaler Place 1 spray into the nose daily.   valACYclovir  (VALTREX ) 500 MG tablet Take 500 mg by mouth daily.    No facility-administered encounter medications on file as of 04/10/2024.    Allergies (verified) Gabapentin , Lisinopril, Penicillins, and Sulfa antibiotics   History: Past Medical History:  Diagnosis Date   Anxiety    Bowel obstruction (HCC)    Burning with urination 01/04/2016    Diabetes mellitus with neuropathy    Genital herpes    GERD (gastroesophageal reflux disease)    Hematuria 01/04/2016   Herpes 12/22/2015   Hyperlipidemia    Hypertension    LLQ abdominal tenderness 01/04/2016   Neuropathy    Pelvic pressure in female 01/04/2016   Recurrent UTI    Sciatic nerve disease    Small bowel obstruction (HCC) 04/27/2016   Urinary frequency 01/04/2016   Urticaria    Past Surgical History:  Procedure Laterality Date   ABDOMINAL HYSTERECTOMY     APPLICATION OF INTRAOPERATIVE CT SCAN N/A 12/19/2018   Procedure: APPLICATION OF INTRAOPERATIVE CT SCAN;  Surgeon: Manya Sells, MD;  Location: [name] OR;  Service: Neurosurgery;  Laterality: N/A;   bowel obstruction     COLON SURGERY     blocked colon   POSTERIOR CERVICAL FUSION/FORAMINOTOMY N/A 12/19/2018   Procedure: Cervical Seven to Thoracic One Posterior cervical fusion;  Surgeon: Manya Sells, MD;  Location: [facility] OR;  Service: Neurosurgery;  Laterality: N/A;  Cervical Seven to Thoracic One Posterior cervical fusion   Family History  Problem Relation Age of Onset   Diabetes Mother    Heart disease Brother    Hypertension Brother    Diabetes Brother    Hypertension Daughter    Diabetes Brother    Diabetes Brother    Alzheimer's disease Brother    Anxiety disorder Daughter    Social History   Socioeconomic History   Marital status: Widowed    Spouse name: Not on file   Number of children: Not on file   Years of education: Not on file   Highest education level: Not on file  Occupational History   Occupation: retired   Tobacco Use   Smoking status: Never   Smokeless tobacco: Current    Types: Snuff  Vaping Use   Vaping status: Never Used  Substance and Sexual Activity   Alcohol use: No   Drug use: No   Sexual activity: Not Currently    Birth control/protection: Surgical    Comment: hyst  Other Topics Concern   Not on file  Social History Narrative   Long term resident of [facility]    Social Drivers  of Health   Financial Resource Strain: Not on file  Food Insecurity: Not on file  Transportation Needs: Not on file  Physical Activity: Not on file  Stress: Not on file  Social Connections: Not on file    Tobacco Counseling Ready to quit: Not Answered Counseling given: Not Answered   Clinical Intake:  Pre-visit preparation completed: Yes  Pain : No/denies pain     BMI - recorded: 39.34 Nutritional Status:  BMI > 30  Obese Nutritional Risks: Unintentional weight loss, Failure to thrive Diabetes: Yes CBG done?: Yes CBG resulted in Enter/ Edit results?: Yes Did pt. bring in CBG monitor from home?: No  How often do you need to have someone help you when you read instructions, pamphlets, or other written materials from your doctor or pharmacy?: 5 - Always  Interpreter Needed?: No  Comments: long term resident of SNF   Activities of Daily Living    04/10/2024   10:23 AM  In your present state of health, do you have any difficulty performing the following activities:  Hearing? 0  Vision? 0  Difficulty concentrating or making decisions? 1  Walking or climbing stairs? 1  Dressing or bathing? 1  Doing errands, shopping? 1  Preparing Food and eating ? Y  Using the Toilet? Y  In the past six months, have you accidently leaked urine? Y  Do you have problems with loss of bowel control? Y  Managing your Medications? Y  Managing your Finances? Y  Housekeeping or managing your Housekeeping? Y    Patient Care Team: Marilyne Shu, NP as PCP - [facility]  Indicate any recent Medical Services you may have received from other than [facility] providers in the past year (date may be approximate).     Assessment:   This is a routine wellness examination for [name] [name] .  Hearing/Vision screen No results found.   Goals Addressed             This Visit's Progress    Absence of Fall and Fall-Related Injury    On track    Evidence-based guidance:  Assess fall risk using a validated tool when available. Consider balance and gait impairment, muscle weakness, diminished vision or hearing, environmental hazards, presence of urinary or bowel urgency and/or incontinence.  Communicate fall injury risk to interprofessional healthcare team.  Develop a fall prevention plan with the patient and family.  Promote use of personal vision and auditory aids.  Promote reorientation, appropriate sensory stimulation, and routines to decrease risk of fall when changes in mental status are present.  Assess assistance level required for safe and effective self-care; consider referral for home care.  Encourage physical activity, such as performance of self-care at highest level of ability, strength and balance exercise program, and provision of appropriate assistive devices; refer to rehabilitation therapy.  Refer to community-based fall prevention program where available.  If fall occurs, determine the cause and revise fall injury prevention plan.  Regularly review medication contribution to fall risk; consider risk related to polypharmacy and age.  Refer to pharmacist for consultation when concerns about medications are revealed.  Balance adequate pain management with potential for oversedation.  Provide guidance related to environmental modifications.  Consider supplementation with Vitamin D.   Notes:      Follow up with Primary Care Provider   On track    General - Client will not be readmitted within 30 days ([facility])   On track      Depression Screen    04/10/2024   10:25 AM 01/09/2024   10:57 AM 11/29/2023   11:54 AM 04/04/2023   10:33 AM 03/21/2023   11:23 AM 03/16/2022    1:51 PM 12/22/2021    1:33 PM  PHQ 2/9 Scores  PHQ - 2 Score 0 0 0 0 0 0 0  PHQ- 9 Score  0 3        Fall Risk  04/10/2024   10:24 AM 01/09/2024   10:57 AM 11/29/2023   11:54 AM 04/04/2023   10:33 AM 03/21/2023   11:22 AM  Fall Risk    Falls in the past year? 0 0 0 0 0  Number falls in past yr: 0 0 0 0 0  Injury with Fall? 0 0 0 0 0  Risk for fall due to : Impaired mobility;Medication side effect Impaired balance/gait;Impaired mobility Impaired balance/gait;Impaired mobility Impaired balance/gait No Fall Risks  Follow up   Falls evaluation completed  Falls evaluation completed    MEDICARE RISK AT HOME: Medicare Risk at Home Any stairs in or around the home?: Yes If so, are there any without handrails?: No Home free of loose throw rugs in walkways, pet beds, electrical cords, etc?: Yes Adequate lighting in your home to reduce risk of falls?: Yes Life alert?: No Use of a cane, walker or w/c?: Yes Grab bars in the bathroom?: Yes Shower chair or bench in shower?: Yes Elevated toilet seat or a handicapped toilet?: Yes  TIMED UP AND GO:  Was the test performed?  Yes  Length of time to ambulate 10 feet: 10 sec Gait slow and steady with assistive device    Cognitive Function:    04/10/2024   10:25 AM 04/04/2023   10:34 AM 03/16/2022    1:52 PM 03/08/2021   12:09 PM  MMSE - Mini Mental State Exam  Not completed:    Refused  Orientation to time 5 4 4    Orientation to Place 5 5 5    Registration 3 3 3    Attention/ Calculation 3 4 4    Recall 3 2 2    Language- name 2 objects 2 2 2    Language- repeat 0 1 1   Language- follow 3 step command 1 3 2    Language- read & follow direction 1 1 1    Write a sentence 0 0 0   Copy design 0 0 0   Total score 23 25 24          04/10/2024   10:27 AM 04/04/2023   10:35 AM 03/16/2022    1:53 PM 03/06/2020   10:23 AM  6CIT Screen  What Year? 0 points 0 points 0 points 0 points  What month? 0 points 0 points 0 points 0 points  What time? 0 points 3 points 3 points 3 points  Count back from 20 4 points 2 points 2 points 2 points  Months in reverse 4 points 2 points 2 points 2 points  Repeat phrase 6 points 2 points 2 points 2 points  Total Score 14 points 9 points 9 points 9 points     Immunizations Immunization History  Administered Date(s) Administered   Fluad Quad(high Dose 65+) 09/13/2022, 09/19/2023   Influenza,inj,Quad PF,6+ Mos 09/15/2021   Influenza-Unspecified 09/16/2019, 09/18/2020   Moderna Covid-19 Vaccine  Bivalent Booster 17yrs & up 10/05/2021, 10/05/2022   Moderna SARS-COV2 Booster Vaccination 01/15/2021, 07/28/2021   Moderna Sars-Covid-2 Vaccination 05/08/2020, 06/04/2020, 03/12/2024   PNEUMOCOCCAL CONJUGATE-20 07/06/2021   Rsv, Bivalent, Protein Subunit Rsvpref,pf Pattricia Bores) 11/14/2022   Tdap 10/13/2021   Unspecified SARS-COV-2 Vaccination 09/19/2023   Zoster Recombinant(Shingrix) 08/10/2021, 11/12/2021    TDAP status: Up to date  Flu Vaccine status: Up to date  Pneumococcal vaccine status: Up to date  Covid-19 vaccine status: Completed vaccines  Qualifies for Shingles Vaccine? No   Zostavax completed Yes     Screening Tests Health Maintenance  Topic Date Due   FOOT EXAM  05/30/2024  Diabetic kidney evaluation - Urine ACR  05/31/2024   INFLUENZA VACCINE  07/12/2024   COVID-19 Vaccine (8 - 2024-25 season) 09/11/2024   HEMOGLOBIN A1C  09/17/2024   OPHTHALMOLOGY EXAM  11/27/2024   Diabetic kidney evaluation - eGFR measurement  04/01/2025   Medicare Annual Wellness (AWV)  04/10/2025   DTaP/Tdap/Td (2 - Td or Tdap) 10/14/2031   Pneumonia Vaccine 84+ Years old  Completed   DEXA SCAN  Completed   Zoster Vaccines- Shingrix  Completed   HPV VACCINES  Aged Out   Meningococcal B Vaccine  Aged Out   Hepatitis C Screening  Discontinued    Health Maintenance  There are no preventive care reminders to display for this patient.   Colorectal cancer screening: No longer required.   Mammogram status: No longer required due to age.  Bone Density status: Completed 2023. Results reflect: Bone density results: OSTEOPOROSIS. Repeat every   years.  Lung Cancer Screening: (Low Dose CT Chest recommended if Age 3-80 years, 20 pack-year  currently smoking OR have quit w/in 15years.) does not qualify.   Lung Cancer Screening Referral:   Additional Screening:  Hepatitis C Screening: does not qualify; Completed   Vision Screening: Recommended annual ophthalmology exams for early detection of glaucoma and other disorders of the eye. Is the patient up to date with their annual eye exam?  No  Who is the provider or what is the name of the office in which the patient attends annual eye exams?  If pt is not established with a provider, would they like to be referred to a provider to establish care? No .   Dental Screening: Recommended annual dental exams for proper oral hygiene  Diabetic Foot Exam: Diabetic Foot Exam: Completed [id number]  Community Resource Referral / Chronic Care Management: CRR required this visit?  No   CCM required this visit?  No     Plan:     I have personally reviewed and noted the following in the patient's chart:   Medical and social history Use of alcohol, tobacco or illicit drugs  Current medications and supplements including opioid prescriptions. Patient is currently taking opioid prescriptions. Information provided to patient regarding non-opioid alternatives. Patient advised to discuss non-opioid treatment plan with their provider. Functional ability and status Nutritional status Physical activity Advanced directives List of other physicians Hospitalizations, surgeries, and ER visits in previous 12 months Vitals Screenings to include cognitive, depression, and falls Referrals and appointments  In addition, I have reviewed and discussed with patient certain preventive protocols, quality metrics, and best practice recommendations. A written personalized care plan for preventive services as well as general preventive health recommendations were provided to patient.     Marilyne Shu, NP   04/10/2024   After Visit Summary: (In Person-Declined) Patient declined AVS at this  time.  Nurse Notes: this exam was performed by myself at this facility

## 2024-04-22 ENCOUNTER — Non-Acute Institutional Stay (SKILLED_NURSING_FACILITY): Payer: Self-pay | Admitting: Internal Medicine

## 2024-04-22 ENCOUNTER — Encounter: Payer: Self-pay | Admitting: Internal Medicine

## 2024-04-22 DIAGNOSIS — I10 Essential (primary) hypertension: Secondary | ICD-10-CM

## 2024-04-22 DIAGNOSIS — E1151 Type 2 diabetes mellitus with diabetic peripheral angiopathy without gangrene: Secondary | ICD-10-CM

## 2024-04-22 DIAGNOSIS — N1831 Chronic kidney disease, stage 3a: Secondary | ICD-10-CM | POA: Diagnosis not present

## 2024-04-22 DIAGNOSIS — R600 Localized edema: Secondary | ICD-10-CM

## 2024-04-22 NOTE — Assessment & Plan Note (Signed)
 Current creatinine is 1.02, improved from 1.13.  There is been a corresponding rise in GFR from 49 up to 55.  This is compatible with CKD stage IIIa.  Med list reviewed; no change indicated.  Continue to monitor.

## 2024-04-22 NOTE — Assessment & Plan Note (Signed)
 Lower extremities appear somewhat puffy but there is no pitting edema.  Celine Collard' sign is negative.  I discussed sodium restriction and leg elevation with her.

## 2024-04-22 NOTE — Progress Notes (Unsigned)
 NURSING HOME LOCATION:  [facility] ROOM NUMBER:  [id number]  CODE STATUS:  Full Code  PCP: Marilyne Shu, NP   This is a nursing facility follow up visit for of chronic medical diagnoses to document compliance with Regulation 483.30 (c) in The Long Term Care Survey Manual Phase 2 which mandates caregiver visit ( visits can alternate among physician, PA or NP as per statutes) within 10 days of 30 days / 60 days/ 90 days post admission to SNF date  .  Interim medical record and care since last SNF visit was updated with review of diagnostic studies and change in clinical status since last visit were documented.  HPI: She is a permanent resident of the facility with medical diagnoses of history of small bowel obstruction, diabetes with peripheral neuropathy, dyslipidemia, GERD, and essential hypertension.  Labs are current . Creatinine has improved from 1.13 down to 1.02 with a corresponding rise in GFR from 49 up to 55.  This would be compatible with CKD stage IIIa.  Serially the normocytic, minimally hypochromic anemia is stable to improved with current H/H of 11.1/36.2.  Diabetic control is poor with an A1c of 9.4%, up dramatically from a value of 6.5% on 10/19/2023.  Basal insulin  was increased to 15 units nightly and short acting insulin  5 units prescribed before each meal.  Tradjenta  and Metformin  were continued at current dosages.  TSH is therapeutic at 4.148.  Review of systems: She is concerned about swelling of her feet with some discomfort.  Numbness in the feet is chronic.  She has nocturia up to 5 times a night.  She states her glucoses are running approximately 165 each morning.  She describes dyspepsia with certain food triggers such as potatoes.  She also has occasional dysphagia.  She has a hematoma of the left knee and is uncertain how it occurred.  This is also associated with some discomfort.  Constitutional: No fever, significant weight change, fatigue  Eyes: No  redness, discharge, pain, vision change ENT/mouth: No nasal congestion,  purulent discharge, earache, change in hearing, sore throat  Cardiovascular: No chest pain, palpitations, paroxysmal nocturnal dyspnea, claudication, edema  Respiratory: No cough, sputum production, hemoptysis, DOE, significant snoring, apnea   Gastrointestinal: No heartburn, dysphagia, abdominal pain, nausea /vomiting, rectal bleeding, melena, change in bowels Genitourinary: No dysuria, hematuria, pyuria, incontinence, nocturia Musculoskeletal: No joint stiffness, joint swelling, weakness, pain Dermatologic: No rash, pruritus, change in appearance of skin Neurologic: No dizziness, headache, syncope, seizures, numbness, tingling Psychiatric: No significant anxiety, depression, insomnia, anorexia Endocrine: No change in hair/skin/nails, excessive thirst, excessive hunger, excessive urination  Hematologic/lymphatic: No significant bruising, lymphadenopathy, abnormal bleeding Allergy/immunology: No itchy/watery eyes, significant sneezing, urticaria, angioedema  Physical exam:  Pertinent or positive findings: She is morbidly obese.  She is markedly hard of hearing.  Hyponasal speech pattern is present.  Eyebrows are decreased in density.  She has 3 mandibular teeth but is otherwise edentulous.  Heart sounds are distant.  Abdomen is protuberant.  Pedal pulses are decreased to palpation.  She has nonpitting edema of the lower extremities.  There is an a tiny vertical eschar of the mid abdomen.  She also has free eschar type lesions of the left shin.  There is faint ecchymoses over the dorsum of the left knee.  General appearance: Adequately nourished; no acute distress, increased work of breathing is present.   Lymphatic: No lymphadenopathy about the head, neck, axilla. Eyes: No conjunctival inflammation or lid edema is present. There  is no scleral icterus. Ears:  External ear exam shows no significant lesions or deformities.    Nose:  External nasal examination shows no deformity or inflammation. Nasal mucosa are pink and moist without lesions, exudates Oral exam:  Lips and gums are healthy appearing. There is no oropharyngeal erythema or exudate. Neck:  No thyromegaly, masses, tenderness noted.    Heart:  Normal rate and regular rhythm. S1 and S2 normal without gallop, murmur, click, rub .  Lungs: Chest clear to auscultation without wheezes, rhonchi, rales, rubs. Abdomen: Bowel sounds are normal. Abdomen is soft and nontender with no organomegaly, hernias, masses. GU: Deferred  Extremities:  No cyanosis, clubbing, edema  Neurologic exam : Cn 2-7 intact Strength equal  in upper & lower extremities Balance, Rhomberg, finger to nose testing could not be completed due to clinical state Deep tendon reflexes are equal Skin: Warm & dry w/o tenting. No significant lesions or rash.  See summary under each active problem in the Problem List with associated updated therapeutic plan

## 2024-04-22 NOTE — Patient Instructions (Signed)
 See assessment and plan under each diagnosis in the problem list and acutely for this visit:  CKD (chronic kidney disease), stage III (HCC) Current creatinine is 1.02, improved from 1.13.  There has been a corresponding rise in GFR from 49 up to 55.  This is compatible with CKD stage IIIa.  Med list reviewed; no change indicated.  Continue to monitor.  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  A1c update should be in mid June.  Essential hypertension BP controlled; no change in antihypertensive medications   Edema Lower extremities appear somewhat puffy but there is no pitting edema.  Celine Collard' sign is negative.  I discussed sodium restriction and leg elevation with her.

## 2024-04-22 NOTE — Assessment & Plan Note (Signed)
 Basal insulin  dose was increased to 15 units and short acting insulin  5 units added before each meal.  A1c update should be in mid June.

## 2024-04-22 NOTE — Assessment & Plan Note (Signed)
 BP controlled; no change in antihypertensive medications

## 2024-04-25 DIAGNOSIS — R488 Other symbolic dysfunctions: Secondary | ICD-10-CM | POA: Diagnosis not present

## 2024-04-30 ENCOUNTER — Emergency Department (HOSPITAL_COMMUNITY)
Admission: EM | Admit: 2024-04-30 | Discharge: 2024-04-30 | Disposition: A | Discharge status: Skilled Nursing Facility | Attending: Emergency Medicine | Admitting: Emergency Medicine

## 2024-04-30 ENCOUNTER — Emergency Department (HOSPITAL_COMMUNITY)

## 2024-04-30 ENCOUNTER — Non-Acute Institutional Stay (SKILLED_NURSING_FACILITY): Payer: Self-pay | Admitting: Adult Health

## 2024-04-30 ENCOUNTER — Encounter: Payer: Self-pay | Admitting: Adult Health

## 2024-04-30 ENCOUNTER — Other Ambulatory Visit: Payer: Self-pay

## 2024-04-30 DIAGNOSIS — S0990XA Unspecified injury of head, initial encounter: Secondary | ICD-10-CM | POA: Diagnosis not present

## 2024-04-30 DIAGNOSIS — E1159 Type 2 diabetes mellitus with other circulatory complications: Secondary | ICD-10-CM

## 2024-04-30 DIAGNOSIS — S0003XA Contusion of scalp, initial encounter: Secondary | ICD-10-CM | POA: Insufficient documentation

## 2024-04-30 DIAGNOSIS — Z794 Long term (current) use of insulin: Secondary | ICD-10-CM | POA: Diagnosis not present

## 2024-04-30 DIAGNOSIS — E1122 Type 2 diabetes mellitus with diabetic chronic kidney disease: Secondary | ICD-10-CM | POA: Diagnosis not present

## 2024-04-30 DIAGNOSIS — I6782 Cerebral ischemia: Secondary | ICD-10-CM | POA: Diagnosis not present

## 2024-04-30 DIAGNOSIS — W1809XA Striking against other object with subsequent fall, initial encounter: Secondary | ICD-10-CM | POA: Insufficient documentation

## 2024-04-30 DIAGNOSIS — Z7982 Long term (current) use of aspirin: Secondary | ICD-10-CM | POA: Insufficient documentation

## 2024-04-30 DIAGNOSIS — N183 Chronic kidney disease, stage 3 unspecified: Secondary | ICD-10-CM | POA: Diagnosis not present

## 2024-04-30 DIAGNOSIS — I129 Hypertensive chronic kidney disease with stage 1 through stage 4 chronic kidney disease, or unspecified chronic kidney disease: Secondary | ICD-10-CM | POA: Diagnosis not present

## 2024-04-30 DIAGNOSIS — Z981 Arthrodesis status: Secondary | ICD-10-CM | POA: Diagnosis not present

## 2024-04-30 DIAGNOSIS — M47812 Spondylosis without myelopathy or radiculopathy, cervical region: Secondary | ICD-10-CM | POA: Diagnosis not present

## 2024-04-30 DIAGNOSIS — Z043 Encounter for examination and observation following other accident: Secondary | ICD-10-CM | POA: Diagnosis not present

## 2024-04-30 DIAGNOSIS — W19XXXA Unspecified fall, initial encounter: Secondary | ICD-10-CM

## 2024-04-30 MED ORDER — ACETAMINOPHEN 500 MG PO TABS
1000.0000 mg | ORAL_TABLET | Freq: Once | ORAL | Status: AC
  Administered 2024-04-30: 1000 mg via ORAL
  Filled 2024-04-30: qty 2

## 2024-04-30 NOTE — ED Notes (Signed)
 ED Provider at bedside.

## 2024-04-30 NOTE — ED Triage Notes (Signed)
 Pt had witnessed fall at [facility]. Per staff pt was in shower when her walker moved forward too quickly causing her to fall forward hitting right side of head on wall.  Denies LOC. Takes 81mg  aspirin  daily.

## 2024-04-30 NOTE — ED Provider Notes (Signed)
 [facility] AT [facility] Provider Note   CSN: [id number] Arrival date & time: 04/30/24  0016     History  Chief Complaint  Patient presents with   Fall    [name] [name]  [name] [name] is a 83 y.o. female.  83 yo F here after fall at facility. Was going into the shower when her walker pushed out from under her and she fell forward hitting the top of her head on the wall with a contusion in the same area. No neck pain. No syncope. No pain elsewhere.    Fall    Home Medications Prior to Admission medications   Medication Sig Start Date End Date Taking? Authorizing Provider  acetaminophen  (TYLENOL ) 650 MG CR tablet Take 650 mg by mouth 3 (three) times daily. 01/25/21   [provider]  amLODipine  (NORVASC ) 10 MG tablet Take 1 tablet (10 mg total) by mouth daily. 12/16/19   Maryanna Smart, PA-C  aspirin  81 MG chewable tablet Chew 81 mg by mouth daily.    [provider]  busPIRone (BUSPAR) 5 MG tablet Take 5 mg by mouth 3 (three) times daily.    [provider]  carvedilol (COREG) 6.25 MG tablet Take 6.25 mg by mouth 2 (two) times daily with a meal.    [provider]  Cranberry 500 MG CAPS Take by mouth in the morning and at bedtime. For UTI prevention    [provider]  diclofenac Sodium (VOLTAREN) 1 % GEL Apply topically 3 (three) times daily as needed.    [provider]  DULoxetine  (CYMBALTA ) 60 MG capsule Take 60 mg by mouth daily. Pain management    [provider]  Insulin  Glargine (BASAGLAR  KWIKPEN) 100 UNIT/ML Inject 15 Units into the skin at bedtime.    [provider]  insulin  lispro (HUMALOG) 100 UNIT/ML injection Inject 5 Units into the skin 3 (three) times daily before meals.    [provider]  Insulin  Pen Needle (BD AUTOSHIELD DUO) 30G X 5 MM MISC by Does not apply route. 3/16"    [provider]  lamoTRIgine  (LAMICTAL ) 25 MG tablet Take 25 mg by mouth  daily. 10/01/20   [provider]  linagliptin  (TRADJENTA ) 5 MG TABS tablet Take 5 mg by mouth daily.    [provider]  loratadine  (CLARITIN ) 10 MG tablet Take 10 mg by mouth daily.    [provider]  losartan (COZAAR) 50 MG tablet Take 50 mg by mouth daily.    [provider]  Melatonin 5 MG TABS Take 5 mg by mouth at bedtime.    [provider]  meloxicam (MOBIC) 7.5 MG tablet Take 7.5 mg by mouth daily.    [provider]  metFORMIN  (GLUCOPHAGE ) 1000 MG tablet Take 1,000 mg by mouth 2 (two) times daily with a meal.     [provider]  mirabegron ER (MYRBETRIQ) 50 MG TB24 tablet Take 50 mg by mouth daily.    [provider]  montelukast  (SINGULAIR ) 10 MG tablet Take 10 mg by mouth daily. 06/05/23   [provider]  mupirocin  ointment (BACTROBAN ) 2 % 1 Application 2 (two) times daily.    [provider]  NON FORMULARY Diet: Regular diet    [provider]  omeprazole (PRILOSEC) 20 MG capsule Take 20 mg by mouth daily.    [provider]  polyethylene glycol (MIRALAX  / GLYCOLAX ) packet Take 17 g by mouth daily.    [provider]  pregabalin  (LYRICA ) 25 MG capsule Take 1 capsule (25 mg total) by mouth 3 (three) times daily. 04/02/24   Marilyne Shu, NP  senna-docusate (SENOKOT-S) 8.6-50 MG tablet Take 1 tablet by mouth at bedtime.     [provider]  sertraline  (ZOLOFT ) 50 MG tablet Take 50 mg by mouth daily. 07/04/23   [provider]  simvastatin  (ZOCOR ) 10 MG tablet Take 10 mg by mouth at bedtime.     [provider]  spironolactone (ALDACTONE) 25 MG tablet Take 25 mg by mouth daily. 06/05/23   [provider]  triamcinolone  (NASACORT ) 55 MCG/ACT AERO nasal inhaler Place 1 spray into the nose daily.    [provider]  valACYclovir  (VALTREX ) 500 MG tablet Take 500 mg by mouth daily.  05/05/16   [provider]       Allergies    Gabapentin , Lisinopril, Penicillins, and Sulfa antibiotics    Review of Systems   Review of Systems  Physical Exam Updated Vital Signs BP (!) 158/64   Pulse 60   Temp 97.8 F (36.6 C)   Resp 15   SpO2 95%  Physical Exam Vitals and nursing note reviewed.  Constitutional:      Appearance: She is well-developed.  HENT:     Head: Normocephalic.     Comments: Small hematoma to top of scalp Cardiovascular:     Rate and Rhythm: Normal rate and regular rhythm.  Pulmonary:     Effort: No respiratory distress.     Breath sounds: No stridor.  Abdominal:     General: There is no distension.  Musculoskeletal:     Cervical back: Normal range of motion.  Neurological:     Mental Status: She is alert.     ED Results / Procedures / Treatments   Labs (all labs ordered are listed, but only abnormal results are displayed) Labs Reviewed - No data to display  EKG None  Radiology CT Cervical Spine Wo Contrast Result Date: 04/30/2024 CLINICAL DATA:  Fall, hit head EXAM: CT CERVICAL SPINE WITHOUT CONTRAST TECHNIQUE: Multidetector CT imaging of the cervical spine was performed without intravenous contrast. Multiplanar CT image reconstructions were also generated. RADIATION DOSE REDUCTION: This exam was performed according to the departmental dose-optimization program which includes automated exposure control, adjustment of the mA and/or kV according to patient size and/or use of iterative reconstruction technique. COMPARISON:  03/27/2019 FINDINGS: Alignment: No subluxation Skull base and vertebrae: No acute fracture. No primary bone lesion or focal pathologic process. Soft tissues and spinal canal: No prevertebral fluid or swelling. No visible canal hematoma. 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 Posterior fusion changes noted in the upper thoracic spine, stable. Advanced bilateral degenerative facet disease with fusion across the facet joints as well. Upper  chest: No acute findings Other: None IMPRESSION: Postop and degenerative changes throughout the cervical spine. 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 No acute bony abnormality. Electronically Signed   By: Janeece Mechanic M.D.   On: 04/30/2024 01:47   CT Head Wo Contrast Result Date: 04/30/2024 CLINICAL DATA:  Fall, hit head EXAM: CT HEAD WITHOUT CONTRAST TECHNIQUE: Contiguous axial images were obtained from the base of the skull through the vertex without intravenous contrast. RADIATION DOSE REDUCTION: This exam was performed according to the departmental dose-optimization program which includes automated exposure control, adjustment of the mA and/or kV according to patient size and/or use of iterative reconstruction technique. COMPARISON:  None Available.  FINDINGS: Brain: There is atrophy and chronic small vessel disease changes. No acute intracranial abnormality. Specifically, no hemorrhage, hydrocephalus, mass lesion, acute infarction, or significant intracranial injury. Vascular: No hyperdense vessel or unexpected calcification. Skull: No acute calvarial abnormality. Sinuses/Orbits: No acute findings Other: None IMPRESSION: Atrophy, chronic microvascular disease. No acute intracranial abnormality. Electronically Signed   By: Janeece Mechanic M.D.   On: 04/30/2024 01:44    Procedures Procedures    Medications Ordered in ED Medications  acetaminophen  (TYLENOL ) tablet 1,000 mg (has no administration in time range)    ED Course/ Medical Decision Making/ A&P                                 Medical Decision Making Amount and/or Complexity of Data Reviewed Radiology: ordered.  Risk OTC drugs.   Mechanical fall without e/o injury elsewhere. No blood thinners. Ct head/neck reassuring. No laceration. Stable for d/c to facility.   Final Clinical Impression(s) / ED Diagnoses Final diagnoses:  Fall, initial encounter  Injury of head, initial encounter    Rx / DC Orders ED  Discharge Orders     None         Jasmine Mcbeth, Reymundo Caulk, MD 04/30/24 [phone]

## 2024-04-30 NOTE — Progress Notes (Signed)
 Location:  [facility] Room Number: 115/W Place of Service:  SNF (31) Cashus Halterman S,NP  CODE STATUS: Full  Allergies  Allergen Reactions   Gabapentin     Lisinopril    Penicillins Nausea Only    Has patient had a PCN reaction causing immediate rash, facial/tongue/throat swelling, SOB or lightheadedness with hypotension: Yes Has patient had a PCN reaction causing severe rash involving mucus membranes or skin necrosis: No Has patient had a PCN reaction that required hospitalization No Has patient had a PCN reaction occurring within the last 10 years: No If all of the above answers are "NO", then may proceed with Cephalosporin use.    Sulfa Antibiotics Nausea And Vomiting    Chief Complaint  Patient presents with   Acute Visit    Follow up ED visit.     HPI:  She had a fall yesterday evening while in the shower wall. She did hit her head. Her blood got elevated and she went to the ED for further evaluation. Her work up was negative and has returned to the facility. Her blood pressure remain slightly elevated. She is denying any pain at this time. She states that she is tired today due to the ED trip. Her cbg readings remain elevated with AM average of 170 and PM average 196. Her last hgb A1c is 9.4.   Past Medical History:  Diagnosis Date   Anxiety    Bowel obstruction (HCC)    Burning with urination 01/04/2016   Diabetes mellitus with neuropathy    Genital herpes    GERD (gastroesophageal reflux disease)    Hematuria 01/04/2016   Herpes 12/22/2015   Hyperlipidemia    Hypertension    LLQ abdominal tenderness 01/04/2016   Neuropathy    Pelvic pressure in female 01/04/2016   Recurrent UTI    Sciatic nerve disease    Small bowel obstruction (HCC) 04/27/2016   Urinary frequency 01/04/2016   Urticaria     Past Surgical History:  Procedure Laterality Date   ABDOMINAL HYSTERECTOMY     APPLICATION OF INTRAOPERATIVE CT SCAN N/A 12/19/2018    Procedure: APPLICATION OF INTRAOPERATIVE CT SCAN;  Surgeon: Manya Sells, MD;  Location: [facility] OR;  Service: Neurosurgery;  Laterality: N/A;   bowel obstruction     COLON SURGERY     blocked colon   POSTERIOR CERVICAL FUSION/FORAMINOTOMY N/A 12/19/2018   Procedure: Cervical Seven to Thoracic One Posterior cervical fusion;  Surgeon: Manya Sells, MD;  Location: [facility] OR;  Service: Neurosurgery;  Laterality: N/A;  Cervical Seven to Thoracic One Posterior cervical fusion    Social History   Socioeconomic History   Marital status: Widowed    Spouse name: Not on file   Number of children: Not on file   Years of education: Not on file   Highest education level: Not on file  Occupational History   Occupation: retired   Tobacco Use   Smoking status: Never   Smokeless tobacco: Current    Types: Snuff  Vaping Use   Vaping status: Never Used  Substance and Sexual Activity   Alcohol use: No   Drug use: No   Sexual activity: Not Currently    Birth control/protection: Surgical    Comment: hyst  Other Topics Concern   Not on file  Social History Narrative   Long term resident of [facility]    Social Drivers of Health   Financial Resource Strain: Not on file  Food Insecurity: Not on file  Transportation Needs:  Not on file  Physical Activity: Not on file  Stress: Not on file  Social Connections: Not on file  Intimate Partner Violence: Not on file   Family History  Problem Relation Age of Onset   Diabetes Mother    Heart disease Brother    Hypertension Brother    Diabetes Brother    Hypertension Daughter    Diabetes Brother    Diabetes Brother    Alzheimer's disease Brother    Anxiety disorder Daughter       VITAL SIGNS BP (!) 142/70   Pulse 82   Temp 97.6 F (36.4 C)   Resp 20   Wt 210 lb 6.4 oz (95.4 kg)   SpO2 96%   BMI 39.75 kg/m   Outpatient Encounter Medications as of 04/30/2024  Medication Sig   acetaminophen  (TYLENOL ) 650 MG CR tablet Take 650 mg by mouth 3 (three)  times daily.   amLODipine  (NORVASC ) 10 MG tablet Take 1 tablet (10 mg total) by mouth daily.   aspirin  81 MG chewable tablet Chew 81 mg by mouth daily.   carvedilol (COREG) 6.25 MG tablet Take 6.25 mg by mouth 2 (two) times daily with a meal.   Cranberry 500 MG CAPS Take by mouth in the morning and at bedtime. For UTI prevention   DULoxetine  (CYMBALTA ) 60 MG capsule Take 60 mg by mouth daily. Pain management   Insulin  Glargine (BASAGLAR  KWIKPEN) 100 UNIT/ML Inject 15 Units into the skin at bedtime.   insulin  lispro (HUMALOG) 100 UNIT/ML injection Inject 5 Units into the skin 3 (three) times daily before meals.   Insulin  Pen Needle (BD AUTOSHIELD DUO) 30G X 5 MM MISC by Does not apply route. 3/16"   lamoTRIgine  (LAMICTAL ) 25 MG tablet Take 25 mg by mouth daily.   linagliptin  (TRADJENTA ) 5 MG TABS tablet Take 5 mg by mouth daily.   loratadine  (CLARITIN ) 10 MG tablet Take 10 mg by mouth daily.   losartan (COZAAR) 50 MG tablet Take 50 mg by mouth daily.   Melatonin 5 MG TABS Take 5 mg by mouth at bedtime.   meloxicam (MOBIC) 7.5 MG tablet Take 7.5 mg by mouth daily.   mirabegron ER (MYRBETRIQ) 50 MG TB24 tablet Take 50 mg by mouth daily.   montelukast  (SINGULAIR ) 10 MG tablet Take 10 mg by mouth daily.   mupirocin  ointment (BACTROBAN ) 2 % 1 Application 2 (two) times daily.   NON FORMULARY Diet: Regular diet   nystatin (MYCOSTATIN/NYSTOP) powder Apply 100,000 g topically 2 (two) times daily as needed.   omeprazole (PRILOSEC) 20 MG capsule Take 20 mg by mouth daily.   polyethylene glycol (MIRALAX  / GLYCOLAX ) packet Take 17 g by mouth daily.   pregabalin  (LYRICA ) 25 MG capsule Take 1 capsule (25 mg total) by mouth 3 (three) times daily.   senna-docusate (SENOKOT-S) 8.6-50 MG tablet Take 1 tablet by mouth at bedtime.    sertraline  (ZOLOFT ) 50 MG tablet Take 50 mg by mouth daily.   simvastatin  (ZOCOR ) 10 MG tablet Take 10 mg by mouth at bedtime.    spironolactone (ALDACTONE) 25 MG tablet Take 25 mg  by mouth daily.   triamcinolone  (NASACORT ) 55 MCG/ACT AERO nasal inhaler Place 1 spray into the nose daily.   valACYclovir  (VALTREX ) 500 MG tablet Take 500 mg by mouth daily.    [DISCONTINUED] busPIRone (BUSPAR) 5 MG tablet Take 5 mg by mouth 3 (three) times daily. (Patient not taking: Reported on 04/30/2024)   [DISCONTINUED] diclofenac Sodium (VOLTAREN) 1 % GEL  Apply topically 3 (three) times daily as needed. (Patient not taking: Reported on 04/30/2024)   [DISCONTINUED] metFORMIN  (GLUCOPHAGE ) 1000 MG tablet Take 1,000 mg by mouth 2 (two) times daily with a meal.  (Patient not taking: Reported on 04/30/2024)   No facility-administered encounter medications on file as of 04/30/2024.     SIGNIFICANT DIAGNOSTIC EXAMS  LABS REVIEWED PREVIOUS;   04-13-23: wbc 10.6; hgb 10.0; hct 32.3; mcv 82.2 plt 219; glucose 96; bun 22; creat 0.92; k+ 3.0; na++ 136; ca 8.5; gfr >60 04-17-23: k+ 4.1 06-01-23: urine microalbumin 7.8 06-22-23: glucose 110; bun 28; creat 0.97; k+ 4.1; na++ 130; ca 8.8; gfr 59 hgb A1c 6.8 11-27-23: glucose 172; bun 16; creat 0.73; k+ 3.8; na++ 135; ca 8.6 gfr >60   03-18-24: wbc 10.8; hgb 11.1; hct 36.2; mcv 81.0 plt 258; glucose 173; bun 24; creat 1.13 k+ 4.7; na++ 130; ca 9.3; gfr 49; protein 7.2 albumin 3.8; hgb A1c 9.4   04-01-24: glucose 174; bun 21; creat 1.02; k+ 4.4; na++ 132; ca 9.1; gfr 55   NO NEW LABS.   Review of Systems  Constitutional:  Negative for malaise/fatigue.  Respiratory:  Negative for cough and shortness of breath.   Cardiovascular:  Negative for chest pain, palpitations and leg swelling.  Gastrointestinal:  Negative for abdominal pain, constipation and heartburn.  Musculoskeletal:  Negative for back pain, joint pain and myalgias.  Skin: Negative.   Neurological:  Negative for dizziness.  Psychiatric/Behavioral:  The patient is not nervous/anxious.    Physical Exam Constitutional:      General: She is not in acute distress.    Appearance: She is  well-developed. She is obese. She is not diaphoretic.  Neck:     Thyroid : No thyromegaly.  Cardiovascular:     Rate and Rhythm: Normal rate and regular rhythm.     Heart sounds: Normal heart sounds.  Pulmonary:     Effort: Pulmonary effort is normal. No respiratory distress.     Breath sounds: Normal breath sounds.  Abdominal:     General: Bowel sounds are normal. There is no distension.     Palpations: Abdomen is soft.     Tenderness: There is no abdominal tenderness.  Musculoskeletal:        General: Normal range of motion.     Cervical back: Neck supple.     Right lower leg: No edema.     Left lower leg: No edema.  Lymphadenopathy:     Cervical: No cervical adenopathy.  Skin:    General: Skin is warm and dry.  Neurological:     Mental Status: She is alert. Mental status is at baseline.     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    Britt Candle NP [facility]  call [phone]

## 2024-05-08 DIAGNOSIS — L602 Onychogryphosis: Secondary | ICD-10-CM | POA: Diagnosis not present

## 2024-05-08 DIAGNOSIS — E1140 Type 2 diabetes mellitus with diabetic neuropathy, unspecified: Secondary | ICD-10-CM | POA: Diagnosis not present

## 2024-05-13 ENCOUNTER — Other Ambulatory Visit (HOSPITAL_COMMUNITY)
Admission: RE | Admit: 2024-05-13 | Discharge: 2024-05-13 | Disposition: A | Discharge status: Home / Self Care | Source: Skilled Nursing Facility | Attending: Adult Health | Admitting: Adult Health

## 2024-05-13 DIAGNOSIS — I152 Hypertension secondary to endocrine disorders: Secondary | ICD-10-CM | POA: Diagnosis not present

## 2024-05-13 LAB — BASIC METABOLIC PANEL WITH GFR
Anion gap: 10 (ref 5–15)
BUN: 22 mg/dL (ref 8–23)
CO2: 24 mmol/L (ref 22–32)
Calcium: 9.1 mg/dL (ref 8.9–10.3)
Chloride: 99 mmol/L (ref 98–111)
Creatinine, Ser: 1 mg/dL (ref 0.44–1.00)
GFR, Estimated: 56 mL/min — ABNORMAL LOW (ref >60)
Glucose, Bld: 160 mg/dL — ABNORMAL HIGH (ref 70–99)
Potassium: 4.5 mmol/L (ref 3.5–5.1)
Sodium: 133 mmol/L — ABNORMAL LOW (ref 135–145)

## 2024-05-17 ENCOUNTER — Encounter: Payer: Self-pay | Admitting: Adult Health

## 2024-05-17 ENCOUNTER — Non-Acute Institutional Stay (SKILLED_NURSING_FACILITY): Payer: Self-pay | Admitting: Adult Health

## 2024-05-17 DIAGNOSIS — F0390 Unspecified dementia without behavioral disturbance: Secondary | ICD-10-CM | POA: Diagnosis not present

## 2024-05-17 DIAGNOSIS — E1151 Type 2 diabetes mellitus with diabetic peripheral angiopathy without gangrene: Secondary | ICD-10-CM | POA: Diagnosis not present

## 2024-05-17 DIAGNOSIS — I700 Atherosclerosis of aorta: Secondary | ICD-10-CM | POA: Diagnosis not present

## 2024-05-17 NOTE — Progress Notes (Signed)
 Location:  [facility] Room Number: 115/W Place of Service:  SNF (31)   CODE STATUS: FULL  Allergies  Allergen Reactions   Gabapentin     Lisinopril    Penicillins Nausea Only    Has patient had a PCN reaction causing immediate rash, facial/tongue/throat swelling, SOB or lightheadedness with hypotension: Yes Has patient had a PCN reaction causing severe rash involving mucus membranes or skin necrosis: No Has patient had a PCN reaction that required hospitalization No Has patient had a PCN reaction occurring within the last 10 years: No If all of the above answers are "NO", then may proceed with Cephalosporin use.    Sulfa Antibiotics Nausea And Vomiting    Chief Complaint  Patient presents with   Acute Visit    Care plan meeting     HPI:  We have come together for her care plan meeting. BIMS 12/15 mood 0/30. She is using a walker with one fall  without injury. She is independent assist with her adls. She is frequently incontinent of bladder and bowel. Dietary: regular diet; feeds self; appetite 76-100%; weight is 207 pounds. Therapy: none at this time. 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  Major neurocognitive disorder    Past Medical History:  Diagnosis Date   Anxiety    Bowel obstruction (HCC)    Burning with urination 01/04/2016   Diabetes mellitus with neuropathy    Genital herpes    GERD (gastroesophageal reflux disease)    Hematuria 01/04/2016   Herpes 12/22/2015   Hyperlipidemia    Hypertension    LLQ abdominal tenderness 01/04/2016   Neuropathy    Pelvic pressure in female 01/04/2016   Recurrent UTI    Sciatic nerve disease    Small bowel obstruction (HCC) 04/27/2016   Urinary frequency 01/04/2016   Urticaria     Past Surgical History:  Procedure Laterality Date   ABDOMINAL HYSTERECTOMY     APPLICATION OF INTRAOPERATIVE  CT SCAN N/A 12/19/2018   Procedure: APPLICATION OF INTRAOPERATIVE CT SCAN;  Surgeon: Manya Sells, MD;  Location: [facility] OR;  Service: Neurosurgery;  Laterality: N/A;   bowel obstruction     COLON SURGERY     blocked colon   POSTERIOR CERVICAL FUSION/FORAMINOTOMY N/A 12/19/2018   Procedure: Cervical Seven to Thoracic One Posterior cervical fusion;  Surgeon: Manya Sells, MD;  Location: [facility] OR;  Service: Neurosurgery;  Laterality: N/A;  Cervical Seven to Thoracic One Posterior cervical fusion    Social History   Socioeconomic History   Marital status: Widowed    Spouse name: Not on file   Number of children: Not on file   Years of education: Not on file   Highest education level: Not on file  Occupational History   Occupation: retired   Tobacco Use   Smoking status: Never   Smokeless tobacco: Current    Types: Snuff  Vaping Use   Vaping status: Never Used  Substance and Sexual Activity   Alcohol use: No   Drug use: No   Sexual activity: Not Currently    Birth control/protection: Surgical    Comment: hyst  Other Topics Concern   Not on file  Social History Narrative   Long term resident of [facility]    Social Drivers of Health   Financial Resource Strain: Not on file  Food Insecurity: Not on file  Transportation Needs: Not on file  Physical Activity: Not  on file  Stress: Not on file  Social Connections: Not on file  Intimate Partner Violence: Not on file   Family History  Problem Relation Age of Onset   Diabetes Mother    Heart disease Brother    Hypertension Brother    Diabetes Brother    Hypertension Daughter    Diabetes Brother    Diabetes Brother    Alzheimer's disease Brother    Anxiety disorder Daughter       VITAL SIGNS BP (!) 142/68   Pulse 74   Temp 98.1 F (36.7 C)   Resp 20   Ht 5\' 1"  (1.549 m)   Wt 207 lb 12.8 oz (94.3 kg)   SpO2 98%   BMI 39.26 kg/m   Outpatient Encounter Medications as of 05/17/2024  Medication Sig   acetaminophen  (TYLENOL ) 650  MG CR tablet Take 650 mg by mouth 3 (three) times daily.   amLODipine  (NORVASC ) 10 MG tablet Take 1 tablet (10 mg total) by mouth daily.   aspirin  81 MG chewable tablet Chew 81 mg by mouth daily.   carvedilol (COREG) 6.25 MG tablet Take 6.25 mg by mouth 2 (two) times daily with a meal.   Cranberry 500 MG CAPS Take by mouth in the morning and at bedtime. For UTI prevention   DULoxetine  (CYMBALTA ) 60 MG capsule Take 60 mg by mouth daily. Pain management   guaiFENesin (MUCINEX) 600 MG 12 hr tablet Take 600 mg by mouth every 12 (twelve) hours.   Insulin  Glargine (BASAGLAR  KWIKPEN) 100 UNIT/ML Inject 17 Units into the skin at bedtime.   insulin  lispro (HUMALOG) 100 UNIT/ML injection Inject 7 Units into the skin 3 (three) times daily before meals.   Insulin  Pen Needle (BD AUTOSHIELD DUO) 30G X 5 MM MISC by Does not apply route. 3/16"   lamoTRIgine  (LAMICTAL ) 25 MG tablet Take 25 mg by mouth daily.   linagliptin  (TRADJENTA ) 5 MG TABS tablet Take 5 mg by mouth daily.   loratadine  (CLARITIN ) 10 MG tablet Take 10 mg by mouth daily.   losartan (COZAAR) 50 MG tablet Take 50 mg by mouth daily.   Melatonin 5 MG TABS Take 5 mg by mouth at bedtime.   meloxicam (MOBIC) 7.5 MG tablet Take 7.5 mg by mouth daily.   mirabegron ER (MYRBETRIQ) 50 MG TB24 tablet Take 50 mg by mouth daily.   montelukast  (SINGULAIR ) 10 MG tablet Take 10 mg by mouth daily.   mupirocin  ointment (BACTROBAN ) 2 % 1 Application 2 (two) times daily.   NON FORMULARY Diet: Regular diet   nystatin (MYCOSTATIN/NYSTOP) powder Apply 100,000 g topically 2 (two) times daily as needed.   omeprazole (PRILOSEC) 20 MG capsule Take 20 mg by mouth daily.   polyethylene glycol (MIRALAX  / GLYCOLAX ) packet Take 17 g by mouth daily.   pregabalin  (LYRICA ) 25 MG capsule Take 1 capsule (25 mg total) by mouth 3 (three) times daily.   senna-docusate (SENOKOT-S) 8.6-50 MG tablet Take 1 tablet by mouth at bedtime.    sertraline  (ZOLOFT ) 50 MG tablet Take 50 mg by  mouth daily.   simvastatin  (ZOCOR ) 10 MG tablet Take 10 mg by mouth at bedtime.    spironolactone (ALDACTONE) 25 MG tablet Take 25 mg by mouth 2 (two) times daily.   triamcinolone  (NASACORT ) 55 MCG/ACT AERO nasal inhaler Place 1 spray into the nose daily.   valACYclovir  (VALTREX ) 500 MG tablet Take 500 mg by mouth daily.    No facility-administered encounter medications on file as of 05/17/2024.  SIGNIFICANT DIAGNOSTIC EXAMS   LABS REVIEWED PREVIOUS;   06-01-23: urine microalbumin 7.8 06-22-23: glucose 110; bun 28; creat 0.97; k+ 4.1; na++ 130; ca 8.8; gfr 59 hgb A1c 6.8 11-27-23: glucose 172; bun 16; creat 0.73; k+ 3.8; na++ 135; ca 8.6 gfr >60   03-18-24: wbc 10.8; hgb 11.1; hct 36.2; mcv 81.0 plt 258; glucose 173; bun 24; creat 1.13 k+ 4.7; na++ 130; ca 9.3; gfr 49; protein 7.2 albumin 3.8; hgb A1c 9.4   04-01-24: glucose 174; bun 21; creat 1.02; k+ 4.4; na++ 132; ca 9.1; gfr 55   NO NEW LABS.   Review of Systems  Constitutional:  Negative for malaise/fatigue.  Respiratory:  Negative for cough and shortness of breath.   Cardiovascular:  Negative for chest pain, palpitations and leg swelling.  Gastrointestinal:  Negative for abdominal pain, constipation and heartburn.  Musculoskeletal:  Negative for back pain, joint pain and myalgias.  Skin: Negative.   Neurological:  Negative for dizziness.  Psychiatric/Behavioral:  The patient is not nervous/anxious.    Physical Exam Constitutional:      General: She is not in acute distress.    Appearance: She is well-developed. She is obese. She is not diaphoretic.  Neck:     Thyroid : No thyromegaly.  Cardiovascular:     Rate and Rhythm: Normal rate and regular rhythm.     Pulses: Normal pulses.     Heart sounds: Normal heart sounds.  Pulmonary:     Effort: Pulmonary effort is normal. No respiratory distress.     Breath sounds: Normal breath sounds.  Abdominal:     General: Bowel sounds are normal. There is no distension.      Palpations: Abdomen is soft.     Tenderness: There is no abdominal tenderness.  Musculoskeletal:        General: Normal range of motion.     Cervical back: Neck supple.     Right lower leg: No edema.     Left lower leg: No edema.  Lymphadenopathy:     Cervical: No cervical adenopathy.  Skin:    General: Skin is warm and dry.  Neurological:     Mental Status: She is alert. Mental status is at baseline.     Comments: SLUMS 6/30   Psychiatric:        Mood and Affect: Mood normal.    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    Britt Candle NP [facility]  call [phone]

## 2024-05-20 ENCOUNTER — Encounter: Payer: Self-pay | Admitting: Adult Health

## 2024-05-20 ENCOUNTER — Non-Acute Institutional Stay (SKILLED_NURSING_FACILITY): Payer: Self-pay | Admitting: Adult Health

## 2024-05-20 DIAGNOSIS — E538 Deficiency of other specified B group vitamins: Secondary | ICD-10-CM

## 2024-05-20 DIAGNOSIS — B009 Herpesviral infection, unspecified: Secondary | ICD-10-CM | POA: Diagnosis not present

## 2024-05-20 DIAGNOSIS — J3089 Other allergic rhinitis: Secondary | ICD-10-CM

## 2024-05-20 NOTE — Progress Notes (Unsigned)
 Location:  [facility] Room Number: 109 Place of Service:  SNF (31)   CODE STATUS: full   Allergies  Allergen Reactions   Gabapentin     Lisinopril    Penicillins Nausea Only    Has patient had a PCN reaction causing immediate rash, facial/tongue/throat swelling, SOB or lightheadedness with hypotension: Yes Has patient had a PCN reaction causing severe rash involving mucus membranes or skin necrosis: No Has patient had a PCN reaction that required hospitalization No Has patient had a PCN reaction occurring within the last 10 years: No If all of the above answers are NO, then may proceed with Cephalosporin use.    Sulfa Antibiotics Nausea And Vomiting    Chief Complaint  Patient presents with   Medical Management of Chronic Issues       Chronic non-seasonal allergic rhinitis: Herpes: Vitamin B 12 deficiency    HPI:  She is a 83 y.o. long term resident of this facility being seen for the management of her chronic illnesses:Chronic non-seasonal allergic rhinitis: Herpes: Vitamin B 12 deficiency. Her blood pressure readings have been slightly elevated. She is without complaints of chest pain; no changes in vision no vertigo.    Past Medical History:  Diagnosis Date   Anxiety    Bowel obstruction (HCC)    Burning with urination 01/04/2016   Diabetes mellitus with neuropathy    Genital herpes    GERD (gastroesophageal reflux disease)    Hematuria 01/04/2016   Herpes 12/22/2015   Hyperlipidemia    Hypertension    LLQ abdominal tenderness 01/04/2016   Neuropathy    Pelvic pressure in female 01/04/2016   Recurrent UTI    Sciatic nerve disease    Small bowel obstruction (HCC) 04/27/2016   Urinary frequency 01/04/2016   Urticaria     Past Surgical History:  Procedure Laterality Date   ABDOMINAL HYSTERECTOMY     APPLICATION OF INTRAOPERATIVE CT SCAN N/A 12/19/2018   Procedure: APPLICATION OF INTRAOPERATIVE CT SCAN;  Surgeon: Manya Sells, MD;   Location: [facility] OR;  Service: Neurosurgery;  Laterality: N/A;   bowel obstruction     COLON SURGERY     blocked colon   POSTERIOR CERVICAL FUSION/FORAMINOTOMY N/A 12/19/2018   Procedure: Cervical Seven to Thoracic One Posterior cervical fusion;  Surgeon: Manya Sells, MD;  Location: [facility] OR;  Service: Neurosurgery;  Laterality: N/A;  Cervical Seven to Thoracic One Posterior cervical fusion    Social History   Socioeconomic History   Marital status: Widowed    Spouse name: Not on file   Number of children: Not on file   Years of education: Not on file   Highest education level: Not on file  Occupational History   Occupation: retired   Tobacco Use   Smoking status: Never   Smokeless tobacco: Current    Types: Snuff  Vaping Use   Vaping status: Never Used  Substance and Sexual Activity   Alcohol use: No   Drug use: No   Sexual activity: Not Currently    Birth control/protection: Surgical    Comment: hyst  Other Topics Concern   Not on file  Social History Narrative   Long term resident of [facility]    Social Drivers of Health   Financial Resource Strain: Not on file  Food Insecurity: Not on file  Transportation Needs: Not on file  Physical Activity: Not on file  Stress: Not on file  Social Connections: Not on file  Intimate Partner Violence: Not on  file   Family History  Problem Relation Age of Onset   Diabetes Mother    Heart disease Brother    Hypertension Brother    Diabetes Brother    Hypertension Daughter    Diabetes Brother    Diabetes Brother    Alzheimer's disease Brother    Anxiety disorder Daughter       VITAL SIGNS BP (!) 145/85   Pulse 68   Temp 98.5 F (36.9 C)   Resp 20   Ht 5' 1 (1.549 m)   Wt 207 lb 12.8 oz (94.3 kg)   SpO2 98%   BMI 39.26 kg/m   Outpatient Encounter Medications as of 05/20/2024  Medication Sig   acetaminophen  (TYLENOL ) 650 MG CR tablet Take 650 mg by mouth 3 (three) times daily.   amLODipine  (NORVASC ) 10 MG tablet Take 1  tablet (10 mg total) by mouth daily.   aspirin  81 MG chewable tablet Chew 81 mg by mouth daily.   carvedilol (COREG) 6.25 MG tablet Take 6.25 mg by mouth 2 (two) times daily with a meal.   Cranberry 500 MG CAPS Take by mouth in the morning and at bedtime. For UTI prevention   DULoxetine  (CYMBALTA ) 60 MG capsule Take 60 mg by mouth daily. Pain management   guaiFENesin (MUCINEX) 600 MG 12 hr tablet Take 600 mg by mouth every 12 (twelve) hours.   Insulin  Glargine (BASAGLAR  KWIKPEN) 100 UNIT/ML Inject 17 Units into the skin at bedtime.   insulin  lispro (HUMALOG) 100 UNIT/ML injection Inject 7 Units into the skin 3 (three) times daily before meals.   Insulin  Pen Needle (BD AUTOSHIELD DUO) 30G X 5 MM MISC by Does not apply route. 3/16   lamoTRIgine  (LAMICTAL ) 25 MG tablet Take 25 mg by mouth daily.   linagliptin  (TRADJENTA ) 5 MG TABS tablet Take 5 mg by mouth daily.   loratadine  (CLARITIN ) 10 MG tablet Take 10 mg by mouth daily.   losartan (COZAAR) 50 MG tablet Take 50 mg by mouth daily.   Melatonin 5 MG TABS Take 5 mg by mouth at bedtime.   meloxicam (MOBIC) 7.5 MG tablet Take 7.5 mg by mouth daily.   mirabegron ER (MYRBETRIQ) 50 MG TB24 tablet Take 50 mg by mouth daily.   montelukast  (SINGULAIR ) 10 MG tablet Take 10 mg by mouth daily.   mupirocin  ointment (BACTROBAN ) 2 % 1 Application 2 (two) times daily.   NON FORMULARY Diet: Regular diet   nystatin (MYCOSTATIN/NYSTOP) powder Apply 100,000 g topically 2 (two) times daily as needed.   omeprazole (PRILOSEC) 20 MG capsule Take 20 mg by mouth daily.   polyethylene glycol (MIRALAX  / GLYCOLAX ) packet Take 17 g by mouth daily.   pregabalin  (LYRICA ) 25 MG capsule Take 1 capsule (25 mg total) by mouth 3 (three) times daily.   senna-docusate (SENOKOT-S) 8.6-50 MG tablet Take 1 tablet by mouth at bedtime.    sertraline  (ZOLOFT ) 50 MG tablet Take 50 mg by mouth daily.   simvastatin  (ZOCOR ) 10 MG tablet Take 10 mg by mouth at bedtime.    spironolactone  (ALDACTONE) 25 MG tablet Take 25 mg by mouth 2 (two) times daily.   triamcinolone  (NASACORT ) 55 MCG/ACT AERO nasal inhaler Place 1 spray into the nose daily.   valACYclovir  (VALTREX ) 500 MG tablet Take 500 mg by mouth daily.    No facility-administered encounter medications on file as of 05/20/2024.     SIGNIFICANT DIAGNOSTIC EXAMS  LABS REVIEWED PREVIOUS;     06-01-23: urine microalbumin 7.8  06-22-23: glucose 110; bun 28; creat 0.97; k+ 4.1; na++ 130; ca 8.8; gfr 59 hgb A1c 6.8 11-27-23: glucose 172; bun 16; creat 0.73; k+ 3.8; na++ 135; ca 8.6 gfr >60    TODAY  03-18-24: wbc 10.8; hgb 11.1; hct 36.2; mcv 81.0 plt 258; glucose 173; bun 24; creat 1.13 k+ 4.7; na++ 130; ca 9.3; gfr 49; protein 7.2 albumin 3.8; hgb A1c 9.4   04-01-24: glucose 174; bun 21; creat 1.02; k+ 4.4; na++ 132; ca 9.1; gfr 55   Review of Systems  Constitutional:  Negative for malaise/fatigue.  Respiratory:  Negative for cough and shortness of breath.   Cardiovascular:  Negative for chest pain, palpitations and leg swelling.  Gastrointestinal:  Negative for abdominal pain, constipation and heartburn.  Musculoskeletal:  Negative for back pain, joint pain and myalgias.  Skin: Negative.   Neurological:  Negative for dizziness.  Psychiatric/Behavioral:  The patient is not nervous/anxious.    Physical Exam Constitutional:      General: She is not in acute distress.    Appearance: She is well-developed. She is obese. She is not diaphoretic.  Neck:     Thyroid : No thyromegaly.   Cardiovascular:     Rate and Rhythm: Normal rate and regular rhythm.     Pulses: Normal pulses.     Heart sounds: Normal heart sounds.  Pulmonary:     Effort: Pulmonary effort is normal. No respiratory distress.     Breath sounds: Normal breath sounds.  Abdominal:     General: Bowel sounds are normal. There is no distension.     Palpations: Abdomen is soft.     Tenderness: There is no abdominal tenderness.   Musculoskeletal:         General: Normal range of motion.     Cervical back: Neck supple.     Right lower leg: Edema present.     Left lower leg: Edema present.  Lymphadenopathy:     Cervical: No cervical adenopathy.   Skin:    General: Skin is warm and dry.   Neurological:     Mental Status: She is alert. Mental status is at baseline.     Comments: SLUMS 6/30  Psychiatric:        Mood and Affect: Mood normal.      ASSESSMENT/ PLAN:  TODAY  Chronic non-seasonal allergic rhinitis: will continue claritin  10 mg daily; nasocort daily   2. Herpes: no recent outbreaks; will continue valtrex  500 mg dail y  3. Vitamin B 12 deficiency: level is 806   PREVIOUS   4. Chronic constipation: will continue miralax  daily and senna s daily   5. Hypertension associated with type 2 diabetes mellitus: b/p 145 85 will continue norvasc  10 mg daily coreg 6.25 mg twice daily increase cozaar to 100 mg daily   6. Major depression recurrent chronic: will  continue cymbalta  60 mg daily (failed one wean; also takes for pain management); will continue lamictal  25 mg daily to stabilize mood; zoloft  50 mg daily and buspar 5 mg three times daily for anxiety.   7. CKD stage 3 due to type 2 diabetes mellitus: bun 21; creat 1.02; gfr 55  8. Major neurocognitive deficits: SLUMS 6/30  9. Normocytic anemia: hgb 10.0; hct 32.3   10. Aortic atherosclerosis (ct 12-18-19) is on asa and statin  11. Dyslipidemia associated with type 2 diabetes mellitus: LDL 55 will continue zocor  10 mg daily   12. Type 2 diabetes mellitus with peripheral angiopathy: hgb A1c 6.8 will  continue metformin  1 gm twice daily; tradjenta  5 mg daily semglee  10 units nightly; humalog 5 units with meals  is on asa statin, arb   13. Gastroesophageal reflux disease without esophagitis: will continue prilosec 20 mg daily; did not tolerate being off this medication  14. OAB: will continue myrbetriq 50 mg daily   15. Chronic generalized pain/left sciatic pain: will  continue tylenol  650 mg three times daily lyrica  25 mg three times daily; cymbalta  60 mg daily and voltaren gel three times daily as needed   Will check bmp 06-03-24     Britt Candle NP [facility]   call [phone]

## 2024-06-03 ENCOUNTER — Other Ambulatory Visit (HOSPITAL_COMMUNITY)
Admission: RE | Admit: 2024-06-03 | Discharge: 2024-06-03 | Disposition: A | Discharge status: Home / Self Care | Source: Skilled Nursing Facility | Attending: Adult Health | Admitting: Adult Health

## 2024-06-03 ENCOUNTER — Encounter: Payer: Self-pay | Admitting: Adult Health

## 2024-06-03 ENCOUNTER — Non-Acute Institutional Stay (SKILLED_NURSING_FACILITY): Payer: Self-pay | Admitting: Adult Health

## 2024-06-03 DIAGNOSIS — I152 Hypertension secondary to endocrine disorders: Secondary | ICD-10-CM | POA: Insufficient documentation

## 2024-06-03 DIAGNOSIS — E1159 Type 2 diabetes mellitus with other circulatory complications: Secondary | ICD-10-CM

## 2024-06-03 LAB — BASIC METABOLIC PANEL WITH GFR
Anion gap: 12 (ref 5–15)
BUN: 27 mg/dL — ABNORMAL HIGH (ref 8–23)
CO2: 23 mmol/L (ref 22–32)
Calcium: 8.9 mg/dL (ref 8.9–10.3)
Chloride: 94 mmol/L — ABNORMAL LOW (ref 98–111)
Creatinine, Ser: 1.19 mg/dL — ABNORMAL HIGH (ref 0.44–1.00)
GFR, Estimated: 46 mL/min — ABNORMAL LOW (ref >60)
Glucose, Bld: 124 mg/dL — ABNORMAL HIGH (ref 70–99)
Potassium: 4.6 mmol/L (ref 3.5–5.1)
Sodium: 129 mmol/L — ABNORMAL LOW (ref 135–145)

## 2024-06-03 NOTE — Progress Notes (Unsigned)
 Location:  [facility] Room Number: 113 Place of Service:  SNF (31)   CODE STATUS: full  Allergies  Allergen Reactions   Gabapentin     Lisinopril    Penicillins Nausea Only    Has patient had a PCN reaction causing immediate rash, facial/tongue/throat swelling, SOB or lightheadedness with hypotension: Yes Has patient had a PCN reaction causing severe rash involving mucus membranes or skin necrosis: No Has patient had a PCN reaction that required hospitalization No Has patient had a PCN reaction occurring within the last 10 years: No If all of the above answers are NO, then may proceed with Cephalosporin use.    Sulfa Antibiotics Nausea And Vomiting    Chief Complaint  Patient presents with   Acute Visit    Follow up labs     HPI:  Her sodium is lower at 129. She is presently taking spironolactone twice daily. She is denying any headaches; no muscle spasms.   Past Medical History:  Diagnosis Date   Anxiety    Bowel obstruction (HCC)    Burning with urination 01/04/2016   Diabetes mellitus with neuropathy    Genital herpes    GERD (gastroesophageal reflux disease)    Hematuria 01/04/2016   Herpes 12/22/2015   Hyperlipidemia    Hypertension    LLQ abdominal tenderness 01/04/2016   Neuropathy    Pelvic pressure in female 01/04/2016   Recurrent UTI    Sciatic nerve disease    Small bowel obstruction (HCC) 04/27/2016   Urinary frequency 01/04/2016   Urticaria     Past Surgical History:  Procedure Laterality Date   ABDOMINAL HYSTERECTOMY     APPLICATION OF INTRAOPERATIVE CT SCAN N/A 12/19/2018   Procedure: APPLICATION OF INTRAOPERATIVE CT SCAN;  Surgeon: Unice Pac, MD;  Location: [facility] OR;  Service: Neurosurgery;  Laterality: N/A;   bowel obstruction     COLON SURGERY     blocked colon   POSTERIOR CERVICAL FUSION/FORAMINOTOMY N/A 12/19/2018   Procedure: Cervical Seven to Thoracic One Posterior cervical fusion;  Surgeon: Unice Pac, MD;   Location: [facility] OR;  Service: Neurosurgery;  Laterality: N/A;  Cervical Seven to Thoracic One Posterior cervical fusion    Social History   Socioeconomic History   Marital status: Widowed    Spouse name: Not on file   Number of children: Not on file   Years of education: Not on file   Highest education level: Not on file  Occupational History   Occupation: retired   Tobacco Use   Smoking status: Never   Smokeless tobacco: Current    Types: Snuff  Vaping Use   Vaping status: Never Used  Substance and Sexual Activity   Alcohol use: No   Drug use: No   Sexual activity: Not Currently    Birth control/protection: Surgical    Comment: hyst  Other Topics Concern   Not on file  Social History Narrative   Long term resident of [facility]    Social Drivers of Health   Financial Resource Strain: Not on file  Food Insecurity: Not on file  Transportation Needs: Not on file  Physical Activity: Not on file  Stress: Not on file  Social Connections: Not on file  Intimate Partner Violence: Not on file   Family History  Problem Relation Age of Onset   Diabetes Mother    Heart disease Brother    Hypertension Brother    Diabetes Brother    Hypertension Daughter    Diabetes Brother  Diabetes Brother    Alzheimer's disease Brother    Anxiety disorder Daughter       VITAL SIGNS BP 130/84   Pulse 74   Temp 98.5 F (36.9 C)   Resp 20   Ht 5' 1 (1.549 m)   Wt 207 lb 12.8 oz (94.3 kg)   SpO2 98%   BMI 39.26 kg/m   Outpatient Encounter Medications as of 06/03/2024  Medication Sig   acetaminophen  (TYLENOL ) 650 MG CR tablet Take 650 mg by mouth 3 (three) times daily.   amLODipine  (NORVASC ) 10 MG tablet Take 1 tablet (10 mg total) by mouth daily.   aspirin  81 MG chewable tablet Chew 81 mg by mouth daily.   carvedilol (COREG) 6.25 MG tablet Take 6.25 mg by mouth 2 (two) times daily with a meal.   Cranberry 500 MG CAPS Take by mouth in the morning and at bedtime. For UTI prevention    DULoxetine  (CYMBALTA ) 60 MG capsule Take 60 mg by mouth daily. Pain management   guaiFENesin (MUCINEX) 600 MG 12 hr tablet Take 600 mg by mouth every 12 (twelve) hours.   Insulin  Glargine (BASAGLAR  KWIKPEN) 100 UNIT/ML Inject 17 Units into the skin at bedtime.   insulin  lispro (HUMALOG) 100 UNIT/ML injection Inject 7 Units into the skin 3 (three) times daily before meals.   Insulin  Pen Needle (BD AUTOSHIELD DUO) 30G X 5 MM MISC by Does not apply route. 3/16   lamoTRIgine  (LAMICTAL ) 25 MG tablet Take 25 mg by mouth daily.   linagliptin  (TRADJENTA ) 5 MG TABS tablet Take 5 mg by mouth daily.   loratadine  (CLARITIN ) 10 MG tablet Take 10 mg by mouth daily.   losartan (COZAAR) 100 MG tablet Take 100 mg by mouth daily.   Melatonin 5 MG TABS Take 5 mg by mouth at bedtime.   meloxicam (MOBIC) 7.5 MG tablet Take 7.5 mg by mouth daily.   mirabegron ER (MYRBETRIQ) 50 MG TB24 tablet Take 50 mg by mouth daily.   montelukast  (SINGULAIR ) 10 MG tablet Take 10 mg by mouth daily.   mupirocin  ointment (BACTROBAN ) 2 % 1 Application 2 (two) times daily.   NON FORMULARY Diet: Regular diet   nystatin (MYCOSTATIN/NYSTOP) powder Apply 100,000 g topically 2 (two) times daily as needed.   omeprazole (PRILOSEC) 20 MG capsule Take 20 mg by mouth daily.   polyethylene glycol (MIRALAX  / GLYCOLAX ) packet Take 17 g by mouth daily.   pregabalin  (LYRICA ) 25 MG capsule Take 1 capsule (25 mg total) by mouth 3 (three) times daily.   senna-docusate (SENOKOT-S) 8.6-50 MG tablet Take 1 tablet by mouth at bedtime.    sertraline  (ZOLOFT ) 50 MG tablet Take 50 mg by mouth daily.   simvastatin  (ZOCOR ) 10 MG tablet Take 10 mg by mouth at bedtime.    spironolactone (ALDACTONE) 25 MG tablet Take 25 mg by mouth 2 (two) times daily.   triamcinolone  (NASACORT ) 55 MCG/ACT AERO nasal inhaler Place 1 spray into the nose daily.   valACYclovir  (VALTREX ) 500 MG tablet Take 500 mg by mouth daily.    No facility-administered encounter medications on  file as of 06/03/2024.     SIGNIFICANT DIAGNOSTIC EXAMS  LABS REVIEWED PREVIOUS;     06-01-23: urine microalbumin 7.8 06-22-23: glucose 110; bun 28; creat 0.97; k+ 4.1; na++ 130; ca 8.8; gfr 59 hgb A1c 6.8 11-27-23: glucose 172; bun 16; creat 0.73; k+ 3.8; na++ 135; ca 8.6 gfr >60   03-18-24: wbc 10.8; hgb 11.1; hct 36.2; mcv 81.0 plt  258; glucose 173; bun 24; creat 1.13 k+ 4.7; na++ 130; ca 9.3; gfr 49; protein 7.2 albumin 3.8; hgb A1c 9.4   04-01-24: glucose 174; bun 21; creat 1.02; k+ 4.4; na++ 132; ca 9.1; gfr 55  TODAY  05-13-24: glucose 160; bun 22; creat 1.00; k+ 4.5; na++ 133; ca 9.1 gfr 56 06-03-24: glucose 124; bun 27; creat 1.19; k+ 4.6; na++ 129; ca 8.9 gfr 46    Review of Systems  Constitutional:  Negative for malaise/fatigue.  Respiratory:  Negative for cough and shortness of breath.   Cardiovascular:  Negative for chest pain, palpitations and leg swelling.  Gastrointestinal:  Negative for abdominal pain, constipation and heartburn.  Musculoskeletal:  Negative for back pain, joint pain and myalgias.  Skin: Negative.   Neurological:  Negative for dizziness.  Psychiatric/Behavioral:  The patient is not nervous/anxious.    Physical Exam Constitutional:      General: She is not in acute distress.    Appearance: She is well-developed. She is obese. She is not diaphoretic.  Neck:     Thyroid : No thyromegaly.   Cardiovascular:     Rate and Rhythm: Normal rate and regular rhythm.     Pulses: Normal pulses.     Heart sounds: Normal heart sounds.  Pulmonary:     Effort: Pulmonary effort is normal. No respiratory distress.     Breath sounds: Normal breath sounds.  Abdominal:     General: Bowel sounds are normal. There is no distension.     Palpations: Abdomen is soft.     Tenderness: There is no abdominal tenderness.   Musculoskeletal:        General: Normal range of motion.     Cervical back: Neck supple.     Right lower leg: Edema present.     Left lower leg: Edema  present.  Lymphadenopathy:     Cervical: No cervical adenopathy.   Skin:    General: Skin is warm and dry.   Neurological:     Mental Status: She is alert. Mental status is at baseline.     Comments: SLUMS 6/30  Psychiatric:        Mood and Affect: Mood normal.      ASSESSMENT/ PLAN:  TODAY  Hypertension associated with type 2 diabetes mellitus: due to her electrolytes will lower spironolactone to 25 mg daily and will monitor her status.    Barnie Seip NP [facility]  call [phone]

## 2024-06-06 ENCOUNTER — Other Ambulatory Visit: Payer: Self-pay | Admitting: Adult Health

## 2024-06-06 MED ORDER — PREGABALIN 25 MG PO CAPS: 25.0000 mg | ORAL_CAPSULE | Freq: Two times a day (BID) | ORAL | 0 refills | Status: DC

## 2024-06-10 ENCOUNTER — Other Ambulatory Visit (HOSPITAL_COMMUNITY)
Admission: RE | Admit: 2024-06-10 | Discharge: 2024-06-10 | Disposition: A | Discharge status: Home / Self Care | Source: Skilled Nursing Facility | Attending: Adult Health | Admitting: Adult Health

## 2024-06-10 DIAGNOSIS — I152 Hypertension secondary to endocrine disorders: Secondary | ICD-10-CM | POA: Insufficient documentation

## 2024-06-10 LAB — BASIC METABOLIC PANEL WITH GFR
Anion gap: 10 (ref 5–15)
BUN: 28 mg/dL — ABNORMAL HIGH (ref 8–23)
CO2: 24 mmol/L (ref 22–32)
Calcium: 9 mg/dL (ref 8.9–10.3)
Chloride: 97 mmol/L — ABNORMAL LOW (ref 98–111)
Creatinine, Ser: 1.09 mg/dL — ABNORMAL HIGH (ref 0.44–1.00)
GFR, Estimated: 51 mL/min — ABNORMAL LOW (ref >60)
Glucose, Bld: 125 mg/dL — ABNORMAL HIGH (ref 70–99)
Potassium: 4.5 mmol/L (ref 3.5–5.1)
Sodium: 131 mmol/L — ABNORMAL LOW (ref 135–145)

## 2024-06-26 ENCOUNTER — Encounter: Payer: Self-pay | Admitting: Adult Health

## 2024-06-26 ENCOUNTER — Non-Acute Institutional Stay (SKILLED_NURSING_FACILITY): Payer: Self-pay | Admitting: Adult Health

## 2024-06-26 DIAGNOSIS — F339 Major depressive disorder, recurrent, unspecified: Secondary | ICD-10-CM

## 2024-06-26 DIAGNOSIS — E1159 Type 2 diabetes mellitus with other circulatory complications: Secondary | ICD-10-CM | POA: Diagnosis not present

## 2024-06-26 DIAGNOSIS — K5909 Other constipation: Secondary | ICD-10-CM

## 2024-06-26 DIAGNOSIS — I152 Hypertension secondary to endocrine disorders: Secondary | ICD-10-CM

## 2024-06-26 DIAGNOSIS — N183 Chronic kidney disease, stage 3 unspecified: Secondary | ICD-10-CM

## 2024-06-26 NOTE — Progress Notes (Signed)
 Location:  [facility] Room Number: 110 Place of Service:  SNF (31)   CODE STATUS: full   Allergies  Allergen Reactions   Gabapentin     Lisinopril    Penicillins Nausea Only    Has patient had a PCN reaction causing immediate rash, facial/tongue/throat swelling, SOB or lightheadedness with hypotension: Yes Has patient had a PCN reaction causing severe rash involving mucus membranes or skin necrosis: No Has patient had a PCN reaction that required hospitalization No Has patient had a PCN reaction occurring within the last 10 years: No If all of the above answers are NO, then may proceed with Cephalosporin use.    Sulfa Antibiotics Nausea And Vomiting    Chief Complaint  Patient presents with   Medical Management of Chronic Issues        Chronic constipation:     Hypertension associated with type 2 diabetes mellitus: Major depression recurrent chronic:    HPI:  She is a 83 y.o. long term resident of this facility being seen for the management of her chronic illnesses:Chronic constipation:     Hypertension associated with type 2 diabetes mellitus: Major depression recurrent chronic. She remains ambulatory with a walker. Her pain is presently being managed. There are no reports of anxiety or depressive thoughts.    Past Medical History:  Diagnosis Date   Anxiety    Bowel obstruction (HCC)    Burning with urination 01/04/2016   Diabetes mellitus with neuropathy    Genital herpes    GERD (gastroesophageal reflux disease)    Hematuria 01/04/2016   Herpes 12/22/2015   Hyperlipidemia    Hypertension    LLQ abdominal tenderness 01/04/2016   Neuropathy    Pelvic pressure in female 01/04/2016   Recurrent UTI    Sciatic nerve disease    Small bowel obstruction (HCC) 04/27/2016   Urinary frequency 01/04/2016   Urticaria     Past Surgical History:  Procedure Laterality Date   ABDOMINAL HYSTERECTOMY     APPLICATION OF INTRAOPERATIVE CT SCAN N/A  12/19/2018   Procedure: APPLICATION OF INTRAOPERATIVE CT SCAN;  Surgeon: Unice Pac, MD;  Location: [facility] OR;  Service: Neurosurgery;  Laterality: N/A;   bowel obstruction     COLON SURGERY     blocked colon   POSTERIOR CERVICAL FUSION/FORAMINOTOMY N/A 12/19/2018   Procedure: Cervical Seven to Thoracic One Posterior cervical fusion;  Surgeon: Unice Pac, MD;  Location: [facility] OR;  Service: Neurosurgery;  Laterality: N/A;  Cervical Seven to Thoracic One Posterior cervical fusion    Social History   Socioeconomic History   Marital status: Widowed    Spouse name: Not on file   Number of children: Not on file   Years of education: Not on file   Highest education level: Not on file  Occupational History   Occupation: retired   Tobacco Use   Smoking status: Never   Smokeless tobacco: Current    Types: Snuff  Vaping Use   Vaping status: Never Used  Substance and Sexual Activity   Alcohol use: No   Drug use: No   Sexual activity: Not Currently    Birth control/protection: Surgical    Comment: hyst  Other Topics Concern   Not on file  Social History Narrative   Long term resident of [facility]    Social Drivers of Health   Financial Resource Strain: Not on file  Food Insecurity: Not on file  Transportation Needs: Not on file  Physical Activity: Not on file  Stress: Not on file  Social Connections: Not on file  Intimate Partner Violence: Not on file   Family History  Problem Relation Age of Onset   Diabetes Mother    Heart disease Brother    Hypertension Brother    Diabetes Brother    Hypertension Daughter    Diabetes Brother    Diabetes Brother    Alzheimer's disease Brother    Anxiety disorder Daughter       VITAL SIGNS BP 125/63   Pulse 68   Temp 98.8 F (37.1 C)   Resp 20   Ht 5' 1 (1.549 m)   Wt 207 lb 9.6 oz (94.2 kg)   SpO2 100%   BMI 39.23 kg/m   Outpatient Encounter Medications as of 06/26/2024  Medication Sig   acetaminophen  (TYLENOL ) 650 MG CR tablet  Take 650 mg by mouth 3 (three) times daily.   amLODipine  (NORVASC ) 10 MG tablet Take 1 tablet (10 mg total) by mouth daily.   aspirin  81 MG chewable tablet Chew 81 mg by mouth daily.   carvedilol (COREG) 6.25 MG tablet Take 6.25 mg by mouth 2 (two) times daily with a meal.   Cranberry 500 MG CAPS Take by mouth in the morning and at bedtime. For UTI prevention   DULoxetine  (CYMBALTA ) 60 MG capsule Take 60 mg by mouth daily. Pain management   guaiFENesin (MUCINEX) 600 MG 12 hr tablet Take 600 mg by mouth every 12 (twelve) hours.   Insulin  Glargine (BASAGLAR  KWIKPEN) 100 UNIT/ML Inject 17 Units into the skin at bedtime.   insulin  lispro (HUMALOG) 100 UNIT/ML injection Inject 7 Units into the skin 3 (three) times daily before meals.   Insulin  Pen Needle (BD AUTOSHIELD DUO) 30G X 5 MM MISC by Does not apply route. 3/16   lamoTRIgine  (LAMICTAL ) 25 MG tablet Take 25 mg by mouth daily.   linagliptin  (TRADJENTA ) 5 MG TABS tablet Take 5 mg by mouth daily.   loratadine  (CLARITIN ) 10 MG tablet Take 10 mg by mouth daily.   losartan (COZAAR) 100 MG tablet Take 100 mg by mouth daily.   Melatonin 5 MG TABS Take 5 mg by mouth at bedtime.   meloxicam (MOBIC) 7.5 MG tablet Take 7.5 mg by mouth daily.   mirabegron ER (MYRBETRIQ) 50 MG TB24 tablet Take 50 mg by mouth daily.   montelukast  (SINGULAIR ) 10 MG tablet Take 10 mg by mouth daily.   NON FORMULARY Diet: Regular diet   nystatin (MYCOSTATIN/NYSTOP) powder Apply 100,000 g topically 2 (two) times daily as needed.   omeprazole (PRILOSEC) 20 MG capsule Take 20 mg by mouth daily.   polyethylene glycol (MIRALAX  / GLYCOLAX ) packet Take 17 g by mouth daily.   pregabalin  (LYRICA ) 25 MG capsule Take 1 capsule (25 mg total) by mouth 2 (two) times daily.   senna-docusate (SENOKOT-S) 8.6-50 MG tablet Take 1 tablet by mouth at bedtime.    sertraline  (ZOLOFT ) 50 MG tablet Take 50 mg by mouth daily.   simvastatin  (ZOCOR ) 10 MG tablet Take 10 mg by mouth at bedtime.     spironolactone (ALDACTONE) 25 MG tablet Take 25 mg by mouth daily.   triamcinolone  (NASACORT ) 55 MCG/ACT AERO nasal inhaler Place 1 spray into the nose daily.   valACYclovir  (VALTREX ) 500 MG tablet Take 500 mg by mouth daily.    No facility-administered encounter medications on file as of 06/26/2024.     SIGNIFICANT DIAGNOSTIC EXAMS  LABS REVIEWED PREVIOUS;     06-22-23: glucose 110; bun 28;  creat 0.97; k+ 4.1; na++ 130; ca 8.8; gfr 59 hgb A1c 6.8 11-27-23: glucose 172; bun 16; creat 0.73; k+ 3.8; na++ 135; ca 8.6 gfr >60   03-18-24: wbc 10.8; hgb 11.1; hct 36.2; mcv 81.0 plt 258; glucose 173; bun 24; creat 1.13 k+ 4.7; na++ 130; ca 9.3; gfr 49; protein 7.2 albumin 3.8; hgb A1c 9.4   04-01-24: glucose 174; bun 21; creat 1.02; k+ 4.4; na++ 132; ca 9.1; gfr 55 05-13-24: glucose 160; bun 22; creat 1.00; k+ 4.5; na++ 133; ca 9.1 gfr 56 06-03-24: glucose 124; bun 27; creat 1.19; k+ 4.6; na++ 129; ca 8.9 gfr 46  TODAY  06-10-24: glucose 125; bun 28; creat 1.09; k+ 4.5; na++ 131; ca 9.0; gfr 51    Review of Systems  Constitutional:  Negative for malaise/fatigue.  Respiratory:  Negative for cough and shortness of breath.   Cardiovascular:  Negative for chest pain, palpitations and leg swelling.  Gastrointestinal:  Negative for abdominal pain, constipation and heartburn.  Musculoskeletal:  Negative for back pain, joint pain and myalgias.  Skin: Negative.   Neurological:  Negative for dizziness.  Psychiatric/Behavioral:  The patient is not nervous/anxious.     Physical Exam Constitutional:      General: She is not in acute distress.    Appearance: She is well-developed. She is obese. She is not diaphoretic.  Neck:     Thyroid : No thyromegaly.  Cardiovascular:     Rate and Rhythm: Normal rate and regular rhythm.     Heart sounds: Normal heart sounds.  Pulmonary:     Effort: Pulmonary effort is normal. No respiratory distress.     Breath sounds: Normal breath sounds.  Abdominal:      General: Bowel sounds are normal. There is no distension.     Palpations: Abdomen is soft.     Tenderness: There is no abdominal tenderness.  Musculoskeletal:        General: Normal range of motion.     Cervical back: Neck supple.     Right lower leg: Edema present.     Left lower leg: Edema present.  Lymphadenopathy:     Cervical: No cervical adenopathy.  Skin:    General: Skin is warm and dry.  Neurological:     Mental Status: She is alert. Mental status is at baseline.     Comments: SLUMS 6/30  Psychiatric:        Mood and Affect: Mood normal.       ASSESSMENT/ PLAN:  TODAY  Chronic constipation: will continue miralax  daily and senna s daily   2. Hypertension associated with type 2 diabetes mellitus: b/p 125/63 will continue norvasc  10 mg daily; coreg 6.25 mg twice daily and cozaar 100 mg daily   3. Major depression recurrent chronic: will continue cymbalta  60 mg daily (failed one wean and also takes for pain management) will continue zoloft  50 mg daily; buspar 5 mg three times daily for anxiety; and lamictal  25 mg daily to stabilize mood.   PREVIOUS   4. CKD stage 3 due to type 2 diabetes mellitus: bun 28; creat 1.09; gfr 51  5. Major neurocognitive deficits: SLUMS 6/30  6. Normocytic anemia: hgb 10.0; hct 32.3   7. Aortic atherosclerosis (ct 12-18-19) is on asa and statin  8. Dyslipidemia associated with type 2 diabetes mellitus: LDL 55 will continue zocor  10 mg daily   9. Type 2 diabetes mellitus with peripheral angiopathy: hgb A1c 6.8 will continue metformin  1 gm twice daily; tradjenta  5  mg daily semglee  10 units nightly; humalog 5 units with meals  is on asa statin, arb   10. Gastroesophageal reflux disease without esophagitis: will continue prilosec 20 mg daily; did not tolerate being off this medication  11. OAB: will continue myrbetriq 50 mg daily   12. Chronic generalized pain/left sciatic pain: will continue tylenol  650 mg three times daily lyrica  25 mg  three times daily; cymbalta  60 mg daily and voltaren gel three times daily as needed   13. Chronic non-seasonal allergic rhinitis: will continue claritin  10 mg daily; nasocort daily   14. Herpes: no recent outbreaks; will continue valtrex  500 mg dail y  76. Vitamin B 12 deficiency: level is 806    Will check hgb A1c   Barnie Seip NP [facility]  call [phone]

## 2024-07-01 ENCOUNTER — Other Ambulatory Visit (HOSPITAL_COMMUNITY)
Admission: RE | Admit: 2024-07-01 | Discharge: 2024-07-01 | Disposition: A | Discharge status: Home / Self Care | Source: Skilled Nursing Facility | Attending: Adult Health | Admitting: Adult Health

## 2024-07-01 DIAGNOSIS — E1169 Type 2 diabetes mellitus with other specified complication: Secondary | ICD-10-CM | POA: Insufficient documentation

## 2024-07-01 LAB — HEMOGLOBIN A1C
Hgb A1c MFr Bld: 7.2 % — ABNORMAL HIGH (ref 4.8–5.6)
Mean Plasma Glucose: 159.94 mg/dL

## 2024-07-04 ENCOUNTER — Other Ambulatory Visit (HOSPITAL_COMMUNITY)
Admission: RE | Admit: 2024-07-04 | Discharge: 2024-07-04 | Disposition: A | Discharge status: Home / Self Care | Source: Skilled Nursing Facility | Attending: Adult Health | Admitting: Adult Health

## 2024-07-04 DIAGNOSIS — E1142 Type 2 diabetes mellitus with diabetic polyneuropathy: Secondary | ICD-10-CM | POA: Insufficient documentation

## 2024-07-05 LAB — MICROALBUMIN / CREATININE URINE RATIO
Creatinine, Urine: 24.1 mg/dL
Microalb Creat Ratio: 15 mg/g{creat} (ref 0–29)
Microalb, Ur: 3.6 ug/mL — ABNORMAL HIGH

## 2024-07-08 ENCOUNTER — Other Ambulatory Visit: Payer: Self-pay | Admitting: Adult Health

## 2024-07-08 MED ORDER — PREGABALIN 25 MG PO CAPS
25.0000 mg | ORAL_CAPSULE | Freq: Two times a day (BID) | ORAL | 0 refills | Status: DC
Start: 2024-07-08 — End: 2024-08-13

## 2024-07-19 ENCOUNTER — Encounter: Payer: Self-pay | Admitting: Internal Medicine

## 2024-07-19 ENCOUNTER — Non-Acute Institutional Stay (SKILLED_NURSING_FACILITY): Payer: Self-pay | Admitting: Internal Medicine

## 2024-07-19 DIAGNOSIS — M5416 Radiculopathy, lumbar region: Secondary | ICD-10-CM | POA: Diagnosis not present

## 2024-07-19 DIAGNOSIS — I10 Essential (primary) hypertension: Secondary | ICD-10-CM

## 2024-07-19 DIAGNOSIS — E1151 Type 2 diabetes mellitus with diabetic peripheral angiopathy without gangrene: Secondary | ICD-10-CM

## 2024-07-19 DIAGNOSIS — N1831 Chronic kidney disease, stage 3a: Secondary | ICD-10-CM | POA: Diagnosis not present

## 2024-07-19 DIAGNOSIS — D649 Anemia, unspecified: Secondary | ICD-10-CM | POA: Diagnosis not present

## 2024-07-19 NOTE — Assessment & Plan Note (Signed)
 Serially creatinine has decreased from 1.19 down to 1.09 and GFR improved from 46-51.  This would be compatible with stage IIIa CKD.  Med list reviewed; no indication for change in meds or dosages at this time.

## 2024-07-19 NOTE — Patient Instructions (Signed)
 See assessment and plan under each diagnosis in the problem list and acutely for this visit

## 2024-07-19 NOTE — Assessment & Plan Note (Signed)
 03/18/2024 H/H 11.1/36.2, up from values of 10.7/34.9 on 10/23/2023.  No bleeding dyscrasias reported by staff.  Continue to monitor.

## 2024-07-19 NOTE — Assessment & Plan Note (Addendum)
 Current A1c is 7.2% which represents adequate control in this 83 year old female with multiple advanced comorbidities.  A1c goal would be less than 8%; most importantly is to avoid hypoglycemia which would mimic TIAs.

## 2024-07-19 NOTE — Progress Notes (Signed)
 NURSING HOME LOCATION:  [facility] ROOM NUMBER:  [id number]  CODE STATUS: Full Code   PCP: Landy Barnie RAMAN, NP   This is a nursing facility follow up visit of chronic medical diagnoses to document compliance with Regulation 483.30 (c) in The Long Term Care Survey Manual Phase 2 which mandates caregiver visit ( visits can alternate among physician, PA or NP as per statutes) within 10 days of 30 days / 60 days/ 90 days post admission to SNF date  .  Interim medical record and care since last SNF visit was updated with review of diagnostic studies and change in clinical status since last visit were documented.  HPI: She is a permanent resident of this facility with history of diabetes with peripheral neuropathy; past history of genital herpes; GERD; dyslipidemia; essential hypertension; and past medical history of small bowel obstruction for which required surgical correction. Labs are current and reveal improvement in renal function with creatinine 1.09 and GFR 51,prior  values were 1.19 and 46.  Serially the hyponatremia is stable to improved with current sodium of 131, up from 129.  Diabetic control is within goal at 7.2%.  This represents a dramatic improvement from the A1c of 9.4% on 03/18/2024.  Blood sugar average over the last 2 weeks is 160. She has a normochromic, normocytic anemia; H/H were improved on 03/18/2024 with values of 11.1/36.2, up from 10.7/34.9 on 10/19/2023.  Review of systems: She describes urgency and frequency as well as incontinence.  She has to wear a protective diaper.  She has had a minimally productive cough,but it is better subjectively. She describes increasing numbness in her feet.  She also has pain in the lateral thighs down to the knee.  She denies any bleeding dyscrasias.  Constitutional: No fever, significant weight change, fatigue  Eyes: No redness, discharge, pain, vision change ENT/mouth: No nasal congestion,  purulent discharge, earache,  change in hearing, sore throat  Cardiovascular: No chest pain, palpitations, paroxysmal nocturnal dyspnea, claudication, edema  Respiratory: No hemoptysis, DOE, significant snoring, apnea   Gastrointestinal: No heartburn, dysphagia, abdominal pain, nausea /vomiting, rectal bleeding, melena, change in bowels Musculoskeletal: No joint stiffness, joint swelling, weakness, pain Dermatologic: No rash, pruritus, change in appearance of skin Neurologic: No dizziness, headache, syncope, seizures Psychiatric: No significant anxiety, depression, insomnia, anorexia Endocrine: No change in hair/skin/nails, excessive thirst, excessive hunger, excessive urination  Hematologic/lymphatic: No significant bruising, lymphadenopathy, abnormal bleeding Allergy/immunology: No itchy/watery eyes, significant sneezing, urticaria, angioedema  Physical exam:  Pertinent or positive findings: She is markedly hard of hearing despite wearing hearing aids.  She has only 3 lower teeth and is otherwise edentulous.  Breath sounds are decreased.  She has a grade 1/2 systolic murmur at the base.  Abdomen is protuberant.  Pedal pulses are decreased.  She has scattered ecchymoses over the forearms as well as a larger bruise of the left biceps area.  There is irregular eschar formation in vertical pattern over the left shin.  General appearance: Adequately nourished; no acute distress, increased work of breathing is present.   Lymphatic: No lymphadenopathy about the head, neck, axilla. Eyes: No conjunctival inflammation or lid edema is present. There is no scleral icterus. Ears:  External ear exam shows no significant lesions or deformities.   Nose:  External nasal examination shows no deformity or inflammation. Nasal mucosa are pink and moist without lesions, exudates Heart:  Normal rate and regular rhythm. S1 and S2 normal without gallop, click, rub .  Lungs:  without wheezes, rhonchi, rales, rubs. Abdomen: Bowel sounds are normal.  Abdomen is soft and nontender with no organomegaly, hernias, masses. GU: Deferred  Extremities:  No cyanosis, clubbing, edema  Neurologic exam :Balance, Rhomberg, finger to nose testing could not be completed due to clinical state Skin: Warm & dry w/o tenting. No significant rash.  See summary under each active problem in the Problem List with associated updated therapeutic plan

## 2024-07-19 NOTE — Assessment & Plan Note (Signed)
 BP controlled; no change in antihypertensive medications

## 2024-07-19 NOTE — Assessment & Plan Note (Signed)
 She continues to have classic L4 radiculopathy symptoms bilaterally.  Options are limited, but if she does not respond to stretching; she can be referred for repeat epidural injection.

## 2024-08-06 ENCOUNTER — Encounter: Payer: Self-pay | Admitting: Adult Health

## 2024-08-06 NOTE — Progress Notes (Unsigned)
 This encounter was created in error - please disregard.

## 2024-08-07 NOTE — Progress Notes (Deleted)
 This encounter was created in error - please disregard.

## 2024-08-13 ENCOUNTER — Other Ambulatory Visit: Payer: Self-pay | Admitting: Adult Health

## 2024-08-13 MED ORDER — PREGABALIN 25 MG PO CAPS
25.0000 mg | ORAL_CAPSULE | Freq: Three times a day (TID) | ORAL | 0 refills | Status: DC
Start: 2024-08-13 — End: 2024-09-11

## 2024-08-16 ENCOUNTER — Non-Acute Institutional Stay (SKILLED_NURSING_FACILITY): Payer: Self-pay | Admitting: Adult Health

## 2024-08-16 ENCOUNTER — Encounter: Payer: Self-pay | Admitting: Adult Health

## 2024-08-16 DIAGNOSIS — F0390 Unspecified dementia without behavioral disturbance: Secondary | ICD-10-CM

## 2024-08-16 DIAGNOSIS — E1122 Type 2 diabetes mellitus with diabetic chronic kidney disease: Secondary | ICD-10-CM

## 2024-08-16 DIAGNOSIS — I129 Hypertensive chronic kidney disease with stage 1 through stage 4 chronic kidney disease, or unspecified chronic kidney disease: Secondary | ICD-10-CM

## 2024-08-16 DIAGNOSIS — N183 Chronic kidney disease, stage 3 unspecified: Secondary | ICD-10-CM | POA: Diagnosis not present

## 2024-08-16 DIAGNOSIS — I700 Atherosclerosis of aorta: Secondary | ICD-10-CM

## 2024-08-16 NOTE — Progress Notes (Signed)
 Location:  [facility] Room Number: 110 Place of Service:  SNF (31)   CODE STATUS: Full Code  Allergies  Allergen Reactions   Gabapentin     Lisinopril    Penicillins Nausea Only    Has patient had a PCN reaction causing immediate rash, facial/tongue/throat swelling, SOB or lightheadedness with hypotension: Yes Has patient had a PCN reaction causing severe rash involving mucus membranes or skin necrosis: No Has patient had a PCN reaction that required hospitalization No Has patient had a PCN reaction occurring within the last 10 years: No If all of the above answers are NO, then may proceed with Cephalosporin use.    Sulfa Antibiotics Nausea And Vomiting    Chief Complaint  Patient presents with   Acute Visit    Acute plan meeting    HPI:  We have come together for her care plan meeting. BIMS 11/15 mood 3/30: decreased energy. Uses walker without falls. She is independent with her adl care. She is occasionally incontinent of bladder and frequently incontinent of bowel. Dietary: feeds self; regular diet appetite 76-100% weight is 208 pounds. Therapy: none at this time. Activities: does participate. She does have some right lower extremity edema. 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     Aortic atherosclerosis    Major neurocognitive disorder  Past Medical History:  Diagnosis Date   Anxiety    Bowel obstruction (HCC)    Burning with urination 01/04/2016   Diabetes mellitus with neuropathy    Genital herpes    GERD (gastroesophageal reflux disease)    Hematuria 01/04/2016   Herpes 12/22/2015   Hyperlipidemia    Hypertension    LLQ abdominal tenderness 01/04/2016   Neuropathy    Pelvic pressure in female 01/04/2016   Recurrent UTI    Sciatic nerve disease    Small bowel obstruction (HCC) 04/27/2016   Urinary frequency 01/04/2016    Urticaria     Past Surgical History:  Procedure Laterality Date   ABDOMINAL HYSTERECTOMY     APPLICATION OF INTRAOPERATIVE CT SCAN N/A 12/19/2018   Procedure: APPLICATION OF INTRAOPERATIVE CT SCAN;  Surgeon: Unice Pac, MD;  Location: [facility] OR;  Service: Neurosurgery;  Laterality: N/A;   bowel obstruction     COLON SURGERY     blocked colon   POSTERIOR CERVICAL FUSION/FORAMINOTOMY N/A 12/19/2018   Procedure: Cervical Seven to Thoracic One Posterior cervical fusion;  Surgeon: Unice Pac, MD;  Location: [facility] OR;  Service: Neurosurgery;  Laterality: N/A;  Cervical Seven to Thoracic One Posterior cervical fusion    Social History   Socioeconomic History   Marital status: Widowed    Spouse name: Not on file   Number of children: Not on file   Years of education: Not on file   Highest education level: Not on file  Occupational History   Occupation: retired   Tobacco Use   Smoking status: Never   Smokeless tobacco: Current    Types: Snuff  Vaping Use   Vaping status: Never Used  Substance and Sexual Activity   Alcohol use: No   Drug use: No   Sexual activity: Not Currently    Birth control/protection: Surgical    Comment: hyst  Other Topics Concern   Not on file  Social History Narrative   Long term resident of [facility]    Social Drivers of Health   Financial Resource Strain:  Not on file  Food Insecurity: Not on file  Transportation Needs: Not on file  Physical Activity: Not on file  Stress: Not on file  Social Connections: Not on file  Intimate Partner Violence: Not on file   Family History  Problem Relation Age of Onset   Diabetes Mother    Heart disease Brother    Hypertension Brother    Diabetes Brother    Hypertension Daughter    Diabetes Brother    Diabetes Brother    Alzheimer's disease Brother    Anxiety disorder Daughter       VITAL SIGNS BP 120/70   Pulse 74   Temp 97.6 F (36.4 C)   Resp 20   Ht 5' 1 (1.549 m)   Wt 208 lb (94.3 kg)   SpO2 98%    BMI 39.30 kg/m   Outpatient Encounter Medications as of 08/16/2024  Medication Sig   acetaminophen  (TYLENOL ) 650 MG CR tablet Take 650 mg by mouth 3 (three) times daily.   amLODipine  (NORVASC ) 10 MG tablet Take 1 tablet (10 mg total) by mouth daily.   aspirin  81 MG chewable tablet Chew 81 mg by mouth daily.   carvedilol (COREG) 6.25 MG tablet Take 6.25 mg by mouth 2 (two) times daily with a meal.   Cranberry 500 MG CAPS Take by mouth in the morning and at bedtime. For UTI prevention   DULoxetine  (CYMBALTA ) 60 MG capsule Take 60 mg by mouth daily. Pain management   insulin  aspart protamine - aspart (NOVOLOG  70/30 MIX) (70-30) 100 UNIT/ML FlexPen Inject 7 Units into the skin with breakfast, with lunch, and with evening meal.   Insulin  Glargine (BASAGLAR  KWIKPEN) 100 UNIT/ML Inject 17 Units into the skin at bedtime.   Insulin  Pen Needle (BD AUTOSHIELD DUO) 30G X 5 MM MISC by Does not apply route. 3/16   linagliptin  (TRADJENTA ) 5 MG TABS tablet Take 5 mg by mouth daily.   loratadine  (CLARITIN ) 10 MG tablet Take 10 mg by mouth daily.   losartan (COZAAR) 100 MG tablet Take 100 mg by mouth daily.   Melatonin 5 MG TABS Take 5 mg by mouth at bedtime.   meloxicam (MOBIC) 7.5 MG tablet Take 7.5 mg by mouth daily.   mirabegron ER (MYRBETRIQ) 50 MG TB24 tablet Take 50 mg by mouth daily.   montelukast  (SINGULAIR ) 10 MG tablet Take 10 mg by mouth daily.   mupirocin  ointment (BACTROBAN ) 2 % Apply 1 Application topically 2 (two) times daily. (Patient not taking: Reported on 08/20/2024)   NON FORMULARY Diet: Regular diet   nystatin (MYCOSTATIN/NYSTOP) powder Apply 100,000 g topically 2 (two) times daily as needed. (Patient not taking: Reported on 08/20/2024)   omeprazole (PRILOSEC) 20 MG capsule Take 20 mg by mouth daily.   polyethylene glycol (MIRALAX  / GLYCOLAX ) packet Take 17 g by mouth daily.   pregabalin  (LYRICA ) 25 MG capsule Take 1 capsule (25 mg total) by mouth 3 (three) times daily.   senna-docusate  (SENOKOT-S) 8.6-50 MG tablet Take 1 tablet by mouth at bedtime.    sertraline  (ZOLOFT ) 50 MG tablet Take 50 mg by mouth daily.   simvastatin  (ZOCOR ) 10 MG tablet Take 10 mg by mouth at bedtime.    spironolactone (ALDACTONE) 25 MG tablet Take 25 mg by mouth daily.   triamcinolone  (NASACORT ) 55 MCG/ACT AERO nasal inhaler Place 1 spray into the nose daily.   valACYclovir  (VALTREX ) 500 MG tablet Take 500 mg by mouth daily.    guaiFENesin (MUCINEX) 600 MG 12  hr tablet Take 600 mg by mouth every 12 (twelve) hours. (Patient not taking: Reported on 08/16/2024)   insulin  lispro (HUMALOG) 100 UNIT/ML injection Inject 7 Units into the skin 3 (three) times daily before meals. (Patient not taking: Reported on 08/20/2024)   lamoTRIgine  (LAMICTAL ) 25 MG tablet Take 25 mg by mouth daily. (Patient not taking: Reported on 08/20/2024)   No facility-administered encounter medications on file as of 08/16/2024.     SIGNIFICANT DIAGNOSTIC EXAMS  LABS REVIEWED PREVIOUS;     11-27-23: glucose 172; bun 16; creat 0.73; k+ 3.8; na++ 135; ca 8.6 gfr >60   03-18-24: wbc 10.8; hgb 11.1; hct 36.2; mcv 81.0 plt 258; glucose 173; bun 24; creat 1.13 k+ 4.7; na++ 130; ca 9.3; gfr 49; protein 7.2 albumin 3.8; hgb A1c 9.4   04-01-24: glucose 174; bun 21; creat 1.02; k+ 4.4; na++ 132; ca 9.1; gfr 55 05-13-24: glucose 160; bun 22; creat 1.00; k+ 4.5; na++ 133; ca 9.1 gfr 56 06-03-24: glucose 124; bun 27; creat 1.19; k+ 4.6; na++ 129; ca 8.9 gfr 46  TODAY  06-10-24: glucose 125; bun 28; creat 1.09; k+ 4.5; na++ 131; ca 9.0; gfr 51    Review of Systems  Constitutional:  Negative for malaise/fatigue.  Respiratory:  Negative for cough and shortness of breath.   Cardiovascular:  Positive for leg swelling. Negative for chest pain and palpitations.  Gastrointestinal:  Negative for abdominal pain, constipation and heartburn.  Musculoskeletal:  Negative for back pain, joint pain and myalgias.  Skin: Negative.   Neurological:  Negative for  dizziness.  Psychiatric/Behavioral:  The patient is not nervous/anxious.    Physical Exam Constitutional:      General: She is not in acute distress.    Appearance: She is well-developed. She is not diaphoretic.  Neck:     Thyroid : No thyromegaly.  Cardiovascular:     Rate and Rhythm: Normal rate and regular rhythm.     Pulses: Normal pulses.     Heart sounds: Normal heart sounds.  Pulmonary:     Effort: Pulmonary effort is normal. No respiratory distress.     Breath sounds: Normal breath sounds.  Abdominal:     General: Bowel sounds are normal. There is no distension.     Palpations: Abdomen is soft.     Tenderness: There is no abdominal tenderness.  Musculoskeletal:        General: Normal range of motion.     Cervical back: Neck supple.     Right lower leg: Edema present.     Left lower leg: No edema.  Lymphadenopathy:     Cervical: No cervical adenopathy.  Skin:    General: Skin is warm and dry.  Neurological:     Mental Status: She is alert. Mental status is at baseline.  Psychiatric:        Mood and Affect: Mood normal.      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     Barnie Seip NP [facility] Medicine   call [phone]

## 2024-08-19 DIAGNOSIS — R2241 Localized swelling, mass and lump, right lower limb: Secondary | ICD-10-CM | POA: Diagnosis not present

## 2024-08-20 ENCOUNTER — Encounter: Payer: Self-pay | Admitting: Adult Health

## 2024-08-20 ENCOUNTER — Non-Acute Institutional Stay (SKILLED_NURSING_FACILITY): Payer: Self-pay | Admitting: Adult Health

## 2024-08-20 DIAGNOSIS — N183 Type 2 diabetes mellitus with diabetic chronic kidney disease: Secondary | ICD-10-CM

## 2024-08-20 DIAGNOSIS — E1122 Type 2 diabetes mellitus with diabetic chronic kidney disease: Secondary | ICD-10-CM

## 2024-08-20 DIAGNOSIS — F0390 Unspecified dementia without behavioral disturbance: Secondary | ICD-10-CM

## 2024-08-20 DIAGNOSIS — D649 Anemia, unspecified: Secondary | ICD-10-CM | POA: Diagnosis not present

## 2024-08-20 NOTE — Progress Notes (Signed)
 Location:  [facility] Room Number: 115 W Place of Service:  SNF (31)   CODE STATUS: Full Code   Allergies  Allergen Reactions   Gabapentin     Lisinopril    Penicillins Nausea Only    Has patient had a PCN reaction causing immediate rash, facial/tongue/throat swelling, SOB or lightheadedness with hypotension: Yes Has patient had a PCN reaction causing severe rash involving mucus membranes or skin necrosis: No Has patient had a PCN reaction that required hospitalization No Has patient had a PCN reaction occurring within the last 10 years: No If all of the above answers are NO, then may proceed with Cephalosporin use.    Sulfa Antibiotics Nausea And Vomiting    Chief Complaint  Patient presents with   Medical Management of Chronic Issues             CKD stage 3 due to type 2 diabetes mellitus:    Major neurocognitive deficits:  Normocytic anemia    HPI:  She is a 83 y.o. long term resident of this facility being seen for the management of her chronic illnesses:CKD stage 3 due to type 2 diabetes mellitus:    Major neurocognitive deficits:  Normocytic anemia. There are no reports of uncontrolled pain today; states that she is feeling good. There are no reports of depressive thoughts.    Past Medical History:  Diagnosis Date   Anxiety    Bowel obstruction (HCC)    Burning with urination 01/04/2016   Diabetes mellitus with neuropathy    Genital herpes    GERD (gastroesophageal reflux disease)    Hematuria 01/04/2016   Herpes 12/22/2015   Hyperlipidemia    Hypertension    LLQ abdominal tenderness 01/04/2016   Neuropathy    Pelvic pressure in female 01/04/2016   Recurrent UTI    Sciatic nerve disease    Small bowel obstruction (HCC) 04/27/2016   Urinary frequency 01/04/2016   Urticaria     Past Surgical History:  Procedure Laterality Date   ABDOMINAL HYSTERECTOMY     APPLICATION OF INTRAOPERATIVE CT SCAN N/A 12/19/2018   Procedure: APPLICATION  OF INTRAOPERATIVE CT SCAN;  Surgeon: Unice Pac, MD;  Location: [facility] OR;  Service: Neurosurgery;  Laterality: N/A;   bowel obstruction     COLON SURGERY     blocked colon   POSTERIOR CERVICAL FUSION/FORAMINOTOMY N/A 12/19/2018   Procedure: Cervical Seven to Thoracic One Posterior cervical fusion;  Surgeon: Unice Pac, MD;  Location: [facility] OR;  Service: Neurosurgery;  Laterality: N/A;  Cervical Seven to Thoracic One Posterior cervical fusion    Social History   Socioeconomic History   Marital status: Widowed    Spouse name: Not on file   Number of children: Not on file   Years of education: Not on file   Highest education level: Not on file  Occupational History   Occupation: retired   Tobacco Use   Smoking status: Never   Smokeless tobacco: Current    Types: Snuff  Vaping Use   Vaping status: Never Used  Substance and Sexual Activity   Alcohol use: No   Drug use: No   Sexual activity: Not Currently    Birth control/protection: Surgical    Comment: hyst  Other Topics Concern   Not on file  Social History Narrative   Long term resident of [facility]    Social Drivers of Health   Financial Resource Strain: Not on file  Food Insecurity: Not on file  Transportation Needs:  Not on file  Physical Activity: Not on file  Stress: Not on file  Social Connections: Not on file  Intimate Partner Violence: Not on file   Family History  Problem Relation Age of Onset   Diabetes Mother    Heart disease Brother    Hypertension Brother    Diabetes Brother    Hypertension Daughter    Diabetes Brother    Diabetes Brother    Alzheimer's disease Brother    Anxiety disorder Daughter       VITAL SIGNS BP 131/69   Pulse 69   Ht 5' 1 (1.549 m)   Wt 211 lb 6.4 oz (95.9 kg)   BMI 39.94 kg/m   Outpatient Encounter Medications as of 08/20/2024  Medication Sig   acetaminophen  (TYLENOL ) 650 MG CR tablet Take 650 mg by mouth 3 (three) times daily.   amLODipine  (NORVASC ) 10 MG tablet Take 1  tablet (10 mg total) by mouth daily.   aspirin  81 MG chewable tablet Chew 81 mg by mouth daily.   carvedilol (COREG) 6.25 MG tablet Take 6.25 mg by mouth 2 (two) times daily with a meal.   Cranberry 500 MG CAPS Take by mouth in the morning and at bedtime. For UTI prevention   insulin  aspart protamine - aspart (NOVOLOG  70/30 MIX) (70-30) 100 UNIT/ML FlexPen Inject 7 Units into the skin with breakfast, with lunch, and with evening meal.   Insulin  Glargine (BASAGLAR  KWIKPEN) 100 UNIT/ML Inject 17 Units into the skin at bedtime.   Insulin  Pen Needle (BD AUTOSHIELD DUO) 30G X 5 MM MISC by Does not apply route. 3/16   linagliptin  (TRADJENTA ) 5 MG TABS tablet Take 5 mg by mouth daily.   loratadine  (CLARITIN ) 10 MG tablet Take 10 mg by mouth daily.   losartan (COZAAR) 100 MG tablet Take 100 mg by mouth daily.   Melatonin 5 MG TABS Take 5 mg by mouth at bedtime.   meloxicam (MOBIC) 7.5 MG tablet Take 7.5 mg by mouth daily.   mirabegron ER (MYRBETRIQ) 50 MG TB24 tablet Take 50 mg by mouth daily.   montelukast  (SINGULAIR ) 10 MG tablet Take 10 mg by mouth daily.   NON FORMULARY Diet: Regular diet   omeprazole (PRILOSEC) 20 MG capsule Take 20 mg by mouth daily.   polyethylene glycol (MIRALAX  / GLYCOLAX ) packet Take 17 g by mouth daily.   pregabalin  (LYRICA ) 25 MG capsule Take 1 capsule (25 mg total) by mouth 3 (three) times daily.   senna-docusate (SENOKOT-S) 8.6-50 MG tablet Take 1 tablet by mouth at bedtime.    sertraline  (ZOLOFT ) 50 MG tablet Take 50 mg by mouth daily.   simvastatin  (ZOCOR ) 10 MG tablet Take 10 mg by mouth at bedtime.    spironolactone (ALDACTONE) 25 MG tablet Take 25 mg by mouth daily.   triamcinolone  (NASACORT ) 55 MCG/ACT AERO nasal inhaler Place 1 spray into the nose daily.   valACYclovir  (VALTREX ) 500 MG tablet Take 500 mg by mouth daily.    DULoxetine  (CYMBALTA ) 60 MG capsule Take 60 mg by mouth daily. Pain management   guaiFENesin (MUCINEX) 600 MG 12 hr tablet Take 600 mg by  mouth every 12 (twelve) hours. (Patient not taking: Reported on 08/16/2024)   insulin  lispro (HUMALOG) 100 UNIT/ML injection Inject 7 Units into the skin 3 (three) times daily before meals. (Patient not taking: Reported on 08/20/2024)   lamoTRIgine  (LAMICTAL ) 25 MG tablet Take 25 mg by mouth daily. (Patient not taking: Reported on 08/20/2024)   mupirocin  ointment (BACTROBAN )  2 % Apply 1 Application topically 2 (two) times daily. (Patient not taking: Reported on 08/20/2024)   nystatin (MYCOSTATIN/NYSTOP) powder Apply 100,000 g topically 2 (two) times daily as needed. (Patient not taking: Reported on 08/20/2024)   No facility-administered encounter medications on file as of 08/20/2024.     SIGNIFICANT DIAGNOSTIC EXAMS  LABS REVIEWED PREVIOUS;     11-27-23: glucose 172; bun 16; creat 0.73; k+ 3.8; na++ 135; ca 8.6 gfr >60   03-18-24: wbc 10.8; hgb 11.1; hct 36.2; mcv 81.0 plt 258; glucose 173; bun 24; creat 1.13 k+ 4.7; na++ 130; ca 9.3; gfr 49; protein 7.2 albumin 3.8; hgb A1c 9.4   04-01-24: glucose 174; bun 21; creat 1.02; k+ 4.4; na++ 132; ca 9.1; gfr 55 05-13-24: glucose 160; bun 22; creat 1.00; k+ 4.5; na++ 133; ca 9.1 gfr 56 06-03-24: glucose 124; bun 27; creat 1.19; k+ 4.6; na++ 129; ca 8.9 gfr 46  TODAY  06-10-24: glucose 125; bun 28; creat 1.09; k+ 4.5; na++ 131; ca 9.0; gfr 51  07-01-24: hgb A1c 7.2 07-04-24: urine micro-albumin: 3.6    Review of Systems  Constitutional:  Negative for malaise/fatigue.  Respiratory:  Negative for cough and shortness of breath.   Cardiovascular:  Negative for chest pain, palpitations and leg swelling.  Gastrointestinal:  Negative for abdominal pain, constipation and heartburn.  Musculoskeletal:  Negative for back pain, joint pain and myalgias.  Skin: Negative.   Neurological:  Negative for dizziness.  Psychiatric/Behavioral:  The patient is not nervous/anxious.     Physical Exam Constitutional:      General: She is not in acute distress.    Appearance: She  is well-developed. She is obese. She is not diaphoretic.  Neck:     Thyroid : No thyromegaly.  Cardiovascular:     Rate and Rhythm: Normal rate and regular rhythm.     Heart sounds: Normal heart sounds.  Pulmonary:     Effort: Pulmonary effort is normal. No respiratory distress.     Breath sounds: Normal breath sounds.  Abdominal:     General: Bowel sounds are normal. There is no distension.     Palpations: Abdomen is soft.     Tenderness: There is no abdominal tenderness.  Musculoskeletal:        General: Normal range of motion.     Cervical back: Neck supple.     Right lower leg: Edema present.     Left lower leg: Edema present.  Lymphadenopathy:     Cervical: No cervical adenopathy.  Skin:    General: Skin is warm and dry.  Neurological:     Mental Status: She is alert. Mental status is at baseline.     Comments:  SLUMS 6/30   Psychiatric:        Mood and Affect: Mood normal.      ASSESSMENT/ PLAN:  TODAY  CKD stage 3 due to type 2 diabetes mellitus: bun 28; creat 1.09; gfr 51  2. Major neurocognitive deficits: SLUMS 6/30  3. Normocytic anemia: hgb 10.0; hct 32.3  PREVIOUS   4. Aortic atherosclerosis (ct 12-18-19) is on asa and statin  5. Dyslipidemia associated with type 2 diabetes mellitus: LDL 55 will continue zocor  10 mg daily   6. Type 2 diabetes mellitus with peripheral angiopathy: hgb A1c 7.2 will continue metformin  1 gm twice daily; tradjenta  5 mg daily semglee  10 units nightly; humalog 5 units with meals  is on asa statin, arb   7. Gastroesophageal reflux disease without esophagitis: will  continue prilosec 20 mg daily; did not tolerate being off this medication  8. OAB: will continue myrbetriq 50 mg daily   9. Chronic generalized pain/left sciatic pain: will continue tylenol  650 mg three times daily lyrica  25 mg three times daily; cymbalta  60 mg daily and voltaren gel three times daily as needed   10. Chronic non-seasonal allergic rhinitis: will continue  claritin  10 mg daily; nasocort daily   11. Herpes: no recent outbreaks; will continue valtrex  500 mg dail y  75. Vitamin B 12 deficiency: level is 806   13. Chronic constipation: will continue miralax  daily and senna s daily   14. Hypertension associated with type 2 diabetes mellitus: b/p 131/69 will continue norvasc  10 mg daily; coreg 6.25 mg twice daily and cozaar 100 mg daily   15. Major depression recurrent chronic: will continue cymbalta  60 mg daily (failed one wean and also takes for pain management) will continue zoloft  50 mg daily; buspar 5 mg three times daily for anxiety; and lamictal  25 mg daily to stabilize mood.        Barnie Seip NP [facility]  call [phone]

## 2024-09-02 ENCOUNTER — Encounter: Payer: Self-pay | Admitting: Internal Medicine

## 2024-09-02 ENCOUNTER — Non-Acute Institutional Stay (SKILLED_NURSING_FACILITY): Payer: Self-pay | Admitting: Internal Medicine

## 2024-09-02 DIAGNOSIS — R609 Edema, unspecified: Secondary | ICD-10-CM | POA: Insufficient documentation

## 2024-09-02 DIAGNOSIS — L03032 Cellulitis of left toe: Secondary | ICD-10-CM | POA: Insufficient documentation

## 2024-09-02 DIAGNOSIS — E1151 Type 2 diabetes mellitus with diabetic peripheral angiopathy without gangrene: Secondary | ICD-10-CM | POA: Diagnosis not present

## 2024-09-02 NOTE — Assessment & Plan Note (Signed)
 Cleansing twice a day with chlorhexidine  gluconate or pharmacy an alternative ,followed by application of generic Bactroban  for 7 days.

## 2024-09-02 NOTE — Progress Notes (Unsigned)
 NURSING HOME LOCATION:  [facility] ROOM NUMBER:  115 W  CODE STATUS: Full code  PCP: Landy Barnie RAMAN, NP   This is a nursing facility follow up visit for specific acute issue of possible infection of left great toe in the context of diabetes..  Interim medical record and care since last SNF visit was updated with review of diagnostic studies and change in clinical status since last visit were documented.  YEP:Jeejmzwuob patient's daughter is concerned about infection of the left great toe.  The patient has diabetes complicated by CKD stage IIIa and peripheral neuropathy.  A1c 2 months ago was 7.2% indicating good control. She has not had a CBC and dif since April of this year.  White blood count was mildly elevated at that time at 10,800. She has a history of allergy to penicillin and sulfa.  ROS: She states that the erythema medial to the left great toenail has been present 2-3 weeks.  She denies any constitutional symptoms or purulent discharge.  She also is concerned about swelling of the right foot and actually the entire right lower extremity for 2 to 3 months.  Constitutional: No fever, significant weight change, fatigue  Eyes: No redness, discharge, pain, vision change ENT/mouth: No nasal congestion,  purulent discharge, earache, change in hearing, sore throat  Cardiovascular: No chest pain, palpitations, paroxysmal nocturnal dyspnea, claudication  Respiratory: No cough, sputum production, hemoptysis, DOE, significant snoring, apnea   Gastrointestinal: No heartburn, dysphagia, abdominal pain, nausea /vomiting, rectal bleeding, melena, change in bowels Genitourinary: No dysuria, hematuria, pyuria, incontinence, nocturia Musculoskeletal: No joint stiffness, joint swelling, weakness Dermatologic: No rash, pruritus Neurologic: No dizziness, headache, syncope, seizures, numbness, tingling Psychiatric: No significant anxiety, depression, insomnia,  anorexia Endocrine: No change in hair/skin/nails, excessive thirst, excessive hunger, excessive urination  Hematologic/lymphatic: No significant bruising, lymphadenopathy, abnormal bleeding Allergy/immunology: No itchy/watery eyes, significant sneezing, urticaria, angioedema  Physical exam:  Pertinent or positive findings: 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  Heart rhythm is slightly irregular and rate slow.  Chest is clear with no abnormal breath sounds.  Abdomen is protuberant.  Pedal pulses are not palpable.  Medial to the left great toe nail there is faint erythema which does not blanch.  There is a small,early eschar along the medial aspect of the left great toenail.  The right great toenail is absent.  There is visible edema of the right foot and the right lower extremity appears larger than the left.  She has 3 small dry eschar lesions over the left shin.  General appearance: Adequately nourished; no acute distress, increased work of breathing is present.   Lymphatic: No lymphadenopathy about the head, neck, axilla. Eyes: No conjunctival inflammation or lid edema is present. There is no scleral icterus. Neck:  No thyromegaly, masses, tenderness noted.    Heart:  No gallop, murmur, click, rub .  Lungs:  without wheezes, rhonchi, rales, rubs. Abdomen: Bowel sounds are normal. Abdomen is soft and nontender with no organomegaly, hernias, masses. GU: Deferred  Extremities:  No cyanosis, clubbing Skin: Warm & dry w/o tenting. No significant rash.  See summary under each active problem in the Problem List with associated updated therapeutic plan:  Paronychia of great toe of left foot Cleansing twice a day with chlorhexidine  gluconate or pharmacy an alternative ,followed by application of generic Bactroban  for 7 days.  Asymmetric edema of both lower extremities D-dimer will be performed as a screening.  If this is  elevated; venous Doppler of the right  lower extremity will be completed.  Type 2 diabetes mellitus with peripheral angiopathy (HCC) Current A1c of 7.2% indicates good control as A1c goal would be less than 8% at her age with advanced comorbidities.

## 2024-09-02 NOTE — Assessment & Plan Note (Signed)
 D-dimer will be performed as a screening.  If this is elevated; venous Doppler of the right lower extremity will be completed.

## 2024-09-02 NOTE — Patient Instructions (Signed)
 See assessment and plan under each diagnosis in the problem list and acutely for this visit

## 2024-09-03 ENCOUNTER — Other Ambulatory Visit (HOSPITAL_COMMUNITY)
Admission: RE | Admit: 2024-09-03 | Discharge: 2024-09-03 | Disposition: A | Discharge status: Home / Self Care | Source: Skilled Nursing Facility | Attending: Internal Medicine | Admitting: Internal Medicine

## 2024-09-03 DIAGNOSIS — Z7982 Long term (current) use of aspirin: Secondary | ICD-10-CM | POA: Diagnosis present

## 2024-09-03 LAB — D-DIMER, QUANTITATIVE: D-Dimer, Quant: 0.79 ug{FEU}/mL — ABNORMAL HIGH (ref 0.00–0.50)

## 2024-09-03 NOTE — Assessment & Plan Note (Signed)
 Current A1c of 7.2% indicates good control as A1c goal would be less than 8% at her age with advanced comorbidities.

## 2024-09-11 ENCOUNTER — Other Ambulatory Visit: Payer: Self-pay | Admitting: Adult Health

## 2024-09-11 MED ORDER — PREGABALIN 25 MG PO CAPS: 25.0000 mg | ORAL_CAPSULE | Freq: Three times a day (TID) | ORAL | 0 refills | Status: DC

## 2024-09-16 ENCOUNTER — Encounter: Payer: Self-pay | Admitting: Internal Medicine

## 2024-09-16 ENCOUNTER — Non-Acute Institutional Stay (SKILLED_NURSING_FACILITY): Payer: Self-pay | Admitting: Internal Medicine

## 2024-09-16 DIAGNOSIS — I10 Essential (primary) hypertension: Secondary | ICD-10-CM

## 2024-09-16 DIAGNOSIS — F0390 Unspecified dementia without behavioral disturbance: Secondary | ICD-10-CM

## 2024-09-16 DIAGNOSIS — M5416 Radiculopathy, lumbar region: Secondary | ICD-10-CM

## 2024-09-16 DIAGNOSIS — R600 Localized edema: Secondary | ICD-10-CM | POA: Diagnosis not present

## 2024-09-16 NOTE — Assessment & Plan Note (Signed)
 09/16/2024 staff request evaluation of complaints of right lower extremity edema and pain; however, her complaint was left inguinal and thigh pain.  She describes the pain has been present forever.  The clinical picture in the context of her low SLUMSscore of 6 out of 30 suggest she may be confabulating.

## 2024-09-16 NOTE — Patient Instructions (Signed)
 See assessment and plan under each diagnosis in the problem list and acutely for this visit

## 2024-09-16 NOTE — Assessment & Plan Note (Signed)
 09/16/2024 Staff requested evaluation of right lower extremity edema and pain; however, she describes left inguinal area discomfort with extension to the  left knee present for a long time and forever.  Negative straight leg raising.  Subjective exacerbation with left hip rotation.  Mobic 7.5 will be increased to twice daily for 5 days. This could represent lumbar radiculopathy; but she is not describing any symptoms in the lower back.  The differential would include osteoarthritic pain of the left hip. If the symptoms do not improve or resolve with the increase of Mobic; adjusting the pregabalin  dose could be considered.

## 2024-09-16 NOTE — Progress Notes (Unsigned)
 NURSING HOME [id number]:  [facility] ROOM [phone] W  CODE STATUS:  Full code   PCP: Landy Barnie RAMAN, NP   This is a nursing facility follow up visit for specific acute issue of LLE pain.  Interim medical record and care since last SNF visit was updated with review of diagnostic studies and change in clinical status since last visit were documented.  HPI: Staff notified [facility] practitioners that the patient was complaining of pain in the RIGHT lower extremity in the context of edema.However, when I went to interview and examine the patient ,she had no complaints related to the right lower extremity.  Her concern was pain in the LEFT lower extremity from the inguinal area to the knee.  She could not qualify it or quantitate it other than describing it as sciatica.  She states that it does last hours and has been present long time.  When I asked her what was a long time her response was forever.  There is no associated posterior radiation of the pain and no associated stool or urinary incontinence.. She has a history of lumbar radiculopathy and is on pregabalin  25 mg 3 times daily. She had been evaluated recently for lower extremity edema and D-dimer had been mildly elevated at 0.79 with normal range of 0.00-0.50.  She was also treated for paronychia of the left great toe.  Her family been very concerned that represented a serious infection.  Review of systems:  Constitutional: No fever, significant weight change, fatigue  Eyes: No redness, discharge, pain, vision change ENT/mouth: No nasal congestion,  purulent discharge, earache, change in hearing, sore throat  Cardiovascular: No chest pain, palpitations, paroxysmal nocturnal dyspnea  Respiratory: No cough, sputum production, hemoptysis, DOE, significant snoring, apnea   Gastrointestinal: No heartburn, dysphagia, abdominal pain, nausea /vomiting, rectal bleeding, melena, change in bowels Genitourinary: No  dysuria, hematuria, pyuria, incontinence, nocturia Neurologic: No dizziness, headache, syncope, seizures Psychiatric: No significant anxiety, depression, insomnia, anorexia Endocrine: No change in hair/skin/nails, excessive thirst, excessive hunger, excessive urination  Hematologic/lymphatic: No significant lymphadenopathy, abnormal bleeding Allergy/immunology: No itchy/watery eyes, significant sneezing, urticaria, angioedema  Physical exam: Pertinent or positive findings: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  She has hearing aids but does not wear them.  Cardiac rate is slow rhythm is minimally irregular.  Breath sounds are decreased.  Abdomen is protuberant. Fusiform enlargement of the knees is present.Negative straight leg raising is present on the left.  She indicates some discomfort with rotation of the left hip.  There is slight, nonpitting edema of the right lower extremity.  Toula' sign is negative.  Pedal pulses are not palpable.   There is a faint bruise over the dorsum of the left foot.  She has multiple excoriations of the left shin.  General appearance: Adequately nourished; no acute distress, increased work of breathing is present.   Lymphatic: No lymphadenopathy about the head, neck, axilla. Eyes: No conjunctival inflammation or lid edema is present. There is no scleral icterus. Ears:  External ear exam shows no significant lesions or deformities.   Nose:  External nasal examination shows no deformity or inflammation. Nasal mucosa are pink and moist without lesions, exudates Neck:  No thyromegaly, masses, tenderness noted.    Heart:  No gallop, murmur, click, rub .  Lungs:  without wheezes, rhonchi, rales, rubs. Abdomen: Bowel sounds are normal. Abdomen is soft and nontender with no organomegaly, hernias, masses. GU: Deferred  Extremities:  No cyanosis, clubbing  Skin:  Warm & dry w/o tenting. No significant rash.  See summary under each active problem in the  Problem List with associated updated therapeutic plan:  Lumbar radiculopathy 09/16/2024 Staff requested evaluation of right lower extremity edema and pain; however, she describes left inguinal area discomfort with extension to the  left knee present for a long time and forever.  Negative straight leg raising.  Subjective exacerbation with left hip rotation.  Mobic 7.5 will be increased to twice daily for 5 days. This could represent lumbar radiculopathy; but she is not describing any symptoms in the lower back.  The differential would include osteoarthritic pain of the left hip. If the symptoms do not improve or resolve with the increase of Mobic; adjusting the pregabalin  dose could be considered.  Essential hypertension Today's blood pressure is an outlier and in the context of subjective left inguinal and thigh pain.  Average will be verified and antihypertensives adjusted appropriately.  She is on 4 antihypertensives but the carvedilol is low-dose.  Major neurocognitive disorder (HCC) 09/16/2024 Staff requested evaluation of complaints of right lower extremity edema and pain; however, her complaint was left inguinal and thigh pain.  She states the pain has been present forever.  The clinical picture in the context of her low SLUMSscore of 6 out of 30 suggest she may be confabulating.  Edema She does not mention right lower extremity edema or pain; edema is not clinically significant.  Toula' sign is negative in the right lower extremity.  If she does have recurrent pain in the right lower extremity and progressive edema; D-dimer will be repeated as a prelude to possible venous Doppler.

## 2024-09-16 NOTE — Assessment & Plan Note (Signed)
 Today's blood pressure is an outlier and in the context of subjective left inguinal and thigh pain.  Average will be verified and antihypertensives adjusted appropriately.  She is on 4 antihypertensives but the carvedilol was low-dose.

## 2024-09-17 NOTE — Assessment & Plan Note (Signed)
 She does not mention right lower extremity edema or pain; edema is not clinically significant.  Toula' sign is negative in the right lower extremity.  If she does have recurrent pain in the right lower extremity and progressive edema; D-dimer will be repeated as a prelude to possible venous Doppler.

## 2024-09-19 ENCOUNTER — Non-Acute Institutional Stay (SKILLED_NURSING_FACILITY): Payer: Self-pay | Admitting: Adult Health

## 2024-09-19 ENCOUNTER — Encounter: Payer: Self-pay | Admitting: Adult Health

## 2024-09-19 DIAGNOSIS — E1151 Type 2 diabetes mellitus with diabetic peripheral angiopathy without gangrene: Secondary | ICD-10-CM

## 2024-09-19 DIAGNOSIS — E1169 Type 2 diabetes mellitus with other specified complication: Secondary | ICD-10-CM | POA: Diagnosis not present

## 2024-09-19 DIAGNOSIS — E785 Hyperlipidemia, unspecified: Secondary | ICD-10-CM | POA: Diagnosis not present

## 2024-09-19 DIAGNOSIS — I700 Atherosclerosis of aorta: Secondary | ICD-10-CM

## 2024-09-19 NOTE — Progress Notes (Signed)
 Location:  [facility] Room Number: 115 W Place of Service:  SNF (31)   CODE STATUS: Full Code  Allergies  Allergen Reactions   Gabapentin     Lisinopril    Penicillins Nausea Only    Has patient had a PCN reaction causing immediate rash, facial/tongue/throat swelling, SOB or lightheadedness with hypotension: Yes Has patient had a PCN reaction causing severe rash involving mucus membranes or skin necrosis: No Has patient had a PCN reaction that required hospitalization No Has patient had a PCN reaction occurring within the last 10 years: No If all of the above answers are NO, then may proceed with Cephalosporin use.    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 She is presently denying any uncontrolled pain. She denies any chest pain or shortness of breath    Past Medical History:  Diagnosis Date   Anxiety    Bowel obstruction (HCC)    Burning with urination 01/04/2016   Diabetes mellitus with neuropathy    Genital herpes    GERD (gastroesophageal reflux disease)    Hematuria 01/04/2016   Herpes 12/22/2015   Hyperlipidemia    Hypertension    LLQ abdominal tenderness 01/04/2016   Neuropathy    Pelvic pressure in female 01/04/2016   Recurrent UTI    Sciatic nerve disease    Small bowel obstruction (HCC) 04/27/2016   Urinary frequency 01/04/2016   Urticaria     Past Surgical History:  Procedure Laterality Date   ABDOMINAL HYSTERECTOMY     APPLICATION OF INTRAOPERATIVE  CT SCAN N/A 12/19/2018   Procedure: APPLICATION OF INTRAOPERATIVE CT SCAN;  Surgeon: Unice Pac, MD;  Location: [facility] OR;  Service: Neurosurgery;  Laterality: N/A;   bowel obstruction     COLON SURGERY     blocked colon   POSTERIOR CERVICAL FUSION/FORAMINOTOMY N/A 12/19/2018   Procedure: Cervical Seven to Thoracic One Posterior cervical fusion;  Surgeon: Unice Pac, MD;  Location: [facility] OR;  Service: Neurosurgery;  Laterality: N/A;  Cervical Seven to Thoracic One Posterior cervical fusion    Social History   Socioeconomic History   Marital status: Widowed    Spouse name: Not on file   Number of children: Not on file   Years of education: Not on file   Highest education level: Not on file  Occupational History   Occupation: retired   Tobacco Use   Smoking status: Never   Smokeless tobacco: Current    Types: Snuff  Vaping Use   Vaping status: Never Used  Substance and Sexual Activity   Alcohol use: No   Drug use: No   Sexual activity: Not Currently    Birth control/protection: Surgical    Comment: hyst  Other Topics Concern   Not on file  Social History Narrative   Long term resident of [facility]    Social Drivers of Health   Financial Resource Strain: Not on file  Food Insecurity: Not on  file  Transportation Needs: Not on file  Physical Activity: Not on file  Stress: Not on file  Social Connections: Not on file  Intimate Partner Violence: Not on file   Family History  Problem Relation Age of Onset   Diabetes Mother    Heart disease Brother    Hypertension Brother    Diabetes Brother    Hypertension Daughter    Diabetes Brother    Diabetes Brother    Alzheimer's disease Brother    Anxiety disorder Daughter       VITAL SIGNS BP (!) 152/87   Pulse 62   Temp 98.4 F (36.9 C)   Resp 20   Ht 5' 1 (1.549 m)   Wt 211 lb 6.4 oz (95.9 kg)   SpO2 100%   BMI 39.94 kg/m   Outpatient Encounter Medications as of 09/19/2024  Medication Sig   acetaminophen  (TYLENOL ) 650  MG CR tablet Take 650 mg by mouth 3 (three) times daily.   amLODipine  (NORVASC ) 10 MG tablet Take 1 tablet (10 mg total) by mouth daily.   aspirin  81 MG chewable tablet Chew 81 mg by mouth daily.   carvedilol (COREG) 6.25 MG tablet Take 6.25 mg by mouth 2 (two) times daily with a meal.   Cranberry 500 MG CAPS Take by mouth in the morning and at bedtime. For UTI prevention   DULoxetine  (CYMBALTA ) 60 MG capsule Take 60 mg by mouth daily. Pain management   insulin  aspart protamine - aspart (NOVOLOG  70/30 MIX) (70-30) 100 UNIT/ML FlexPen Inject 7 Units into the skin with breakfast, with lunch, and with evening meal.   Insulin  Pen Needle (BD AUTOSHIELD DUO) 30G X 5 MM MISC by Does not apply route. 3/16   linagliptin  (TRADJENTA ) 5 MG TABS tablet Take 5 mg by mouth daily.   losartan (COZAAR) 100 MG tablet Take 100 mg by mouth daily.   Melatonin 5 MG TABS Take 5 mg by mouth at bedtime.   meloxicam (MOBIC) 7.5 MG tablet Take 7.5 mg by mouth daily. tablet; 7.5 mg; amt: 1; oral At Bedtime 08:00 PM  tablet; 7.5 mg; oral Special Instructions: Tell resident is for her left inguinal and thigh pain Twice A Day 09:00 AM, 09:00 PM   mirabegron ER (MYRBETRIQ) 50 MG TB24 tablet Take 50 mg by mouth daily.   montelukast  (SINGULAIR ) 10 MG tablet Take 10 mg by mouth daily.   NON FORMULARY Diet: Regular diet   omeprazole (PRILOSEC) 20 MG capsule Take 20 mg by mouth daily.   polyethylene glycol (MIRALAX  / GLYCOLAX ) packet Take 17 g by mouth daily.   pregabalin  (LYRICA ) 25 MG capsule Take 1 capsule (25 mg total) by mouth 3 (three) times daily.   senna-docusate (SENOKOT-S) 8.6-50 MG tablet Take 1 tablet by mouth at bedtime.    sertraline  (ZOLOFT ) 25 MG tablet Take 25 mg by mouth daily.   simvastatin  (ZOCOR ) 10 MG tablet Take 10 mg by mouth at bedtime.    spironolactone (ALDACTONE) 25 MG tablet Take 25 mg by mouth daily.   triamcinolone  (NASACORT ) 55 MCG/ACT AERO nasal inhaler Place 1 spray into the nose daily.    valACYclovir  (VALTREX ) 500 MG tablet Take 500 mg by mouth daily.    guaiFENesin (MUCINEX) 600 MG 12 hr tablet Take 600 mg by mouth every 12 (twelve) hours. (Patient not taking: Reported on 08/16/2024)   Insulin  Glargine (BASAGLAR  KWIKPEN) 100 UNIT/ML Inject 17 Units into the skin at bedtime.   insulin  lispro (HUMALOG) 100 UNIT/ML injection Inject  7 Units into the skin 3 (three) times daily before meals. (Patient not taking: Reported on 08/20/2024)   lamoTRIgine  (LAMICTAL ) 25 MG tablet Take 25 mg by mouth daily. (Patient not taking: Reported on 08/20/2024)   loratadine  (CLARITIN ) 10 MG tablet Take 10 mg by mouth daily. (Patient not taking: Reported on 09/19/2024)   mupirocin  ointment (BACTROBAN ) 2 % Apply 1 Application topically 2 (two) times daily. (Patient not taking: Reported on 09/19/2024)   nystatin (MYCOSTATIN/NYSTOP) powder Apply 100,000 g topically 2 (two) times daily as needed. (Patient not taking: Reported on 09/19/2024)   sertraline  (ZOLOFT ) 50 MG tablet Take 50 mg by mouth daily. (Patient not taking: Reported on 09/19/2024)   No facility-administered encounter medications on file as of 09/19/2024.     SIGNIFICANT DIAGNOSTIC EXAMS  LABS REVIEWED PREVIOUS;     11-27-23: glucose 172; bun 16; creat 0.73; k+ 3.8; na++ 135; ca 8.6 gfr >60   03-18-24: wbc 10.8; hgb 11.1; hct 36.2; mcv 81.0 plt 258; glucose 173; bun 24; creat 1.13 k+ 4.7; na++ 130; ca 9.3; gfr 49; protein 7.2 albumin 3.8; hgb A1c 9.4   04-01-24: glucose 174; bun 21; creat 1.02; k+ 4.4; na++ 132; ca 9.1; gfr 55 05-13-24: glucose 160; bun 22; creat 1.00; k+ 4.5; na++ 133; ca 9.1 gfr 56 06-03-24: glucose 124; bun 27; creat 1.19; k+ 4.6; na++ 129; ca 8.9 gfr 46  TODAY  06-10-24: glucose 125; bun 28; creat 1.09; k+ 4.5; na++ 131; ca 9.0; gfr 51  07-01-24: hgb A1c 7.2 07-04-24: urine micro-albumin: 3.6    Review of Systems  Constitutional:  Negative for malaise/fatigue.  Respiratory:  Negative for cough and shortness of breath.    Cardiovascular:  Negative for chest pain, palpitations and leg swelling.  Gastrointestinal:  Negative for abdominal pain, constipation and heartburn.  Musculoskeletal:  Negative for back pain, joint pain and myalgias.  Skin: Negative.   Neurological:  Negative for dizziness.  Psychiatric/Behavioral:  The patient is not nervous/anxious.     Physical Exam Constitutional:      General: She is not in acute distress.    Appearance: She is well-developed. She is obese. She is not diaphoretic.  Neck:     Thyroid : No thyromegaly.  Cardiovascular:     Rate and Rhythm: Normal rate and regular rhythm.     Heart sounds: Normal heart sounds.  Pulmonary:     Effort: Pulmonary effort is normal. No respiratory distress.     Breath sounds: Normal breath sounds.  Abdominal:     General: Bowel sounds are normal. There is no distension.     Palpations: Abdomen is soft.     Tenderness: There is no abdominal tenderness.  Musculoskeletal:        General: Normal range of motion.     Cervical back: Neck supple.     Right lower leg: Edema present.     Left lower leg: Edema present.  Lymphadenopathy:     Cervical: No cervical adenopathy.  Skin:    General: Skin is warm and dry.  Neurological:     Mental Status: She is alert. Mental status is at baseline.     Comments: SLUMS 6/30   Psychiatric:        Mood and Affect: Mood normal.       ASSESSMENT/ PLAN:  TODAY  Aortic atherosclerosis (ct 12-18-19) is on asa and statin  2. Dyslipidemia associated with type 2 diabetes mellitus: ldl 55 will continue zocor  10 mg daily   3. Type  2 diabetes mellitus with peripheral angiopathy: hgb A1c 7.2; will continue metformin  1 gm twice daily; tradjenta  5 mg daily; semglee  10 units nightly; humalog 5 units with meals. Is on asa statin arb   PREVIOUS   4. Gastroesophageal reflux disease without esophagitis: will continue prilosec 20 mg daily; did not tolerate being off this medication  5. OAB: will continue  myrbetriq 50 mg daily   6. Chronic generalized pain/left sciatic pain: will continue tylenol  650 mg three times daily lyrica  25 mg three times daily; cymbalta  60 mg daily and voltaren gel three times daily as needed   7. Chronic non-seasonal allergic rhinitis: will continue claritin  10 mg daily; nasocort daily   8. Herpes: no recent outbreaks; will continue valtrex  500 mg dail y  48. Vitamin B 12 deficiency: level is 806   10. Chronic constipation: will continue miralax  daily and senna s daily   11. Hypertension associated with type 2 diabetes mellitus: b/p 131/69 will continue norvasc  10 mg daily; coreg 6.25 mg twice daily and cozaar 100 mg daily   12. Major depression recurrent chronic: will continue cymbalta  60 mg daily (failed one wean and also takes for pain management) will continue zoloft  50 mg daily; buspar 5 mg three times daily for anxiety; and lamictal  25 mg daily to stabilize mood.   13. CKD stage 3 due to type 2 diabetes mellitus: bun 28; creat 1.09; gfr 51  14. Major neurocognitive deficits: SLUMS 6/30  15. Normocytic anemia: hgb 10.0; hct 32.3    Barnie Seip NP [facility]  call [phone]

## 2024-09-26 ENCOUNTER — Other Ambulatory Visit (HOSPITAL_COMMUNITY)
Admission: RE | Admit: 2024-09-26 | Discharge: 2024-09-26 | Disposition: A | Discharge status: Home / Self Care | Source: Skilled Nursing Facility | Attending: Adult Health | Admitting: Adult Health

## 2024-09-26 DIAGNOSIS — E1169 Type 2 diabetes mellitus with other specified complication: Secondary | ICD-10-CM | POA: Insufficient documentation

## 2024-09-26 LAB — COMPREHENSIVE METABOLIC PANEL WITH GFR
ALT: 11 U/L (ref 0–44)
AST: 16 U/L (ref 15–41)
Albumin: 4 g/dL (ref 3.5–5.0)
Alkaline Phosphatase: 97 U/L (ref 38–126)
Anion gap: 11 (ref 5–15)
BUN: 19 mg/dL (ref 8–23)
CO2: 25 mmol/L (ref 22–32)
Calcium: 9 mg/dL (ref 8.9–10.3)
Chloride: 94 mmol/L — ABNORMAL LOW (ref 98–111)
Creatinine, Ser: 1.03 mg/dL — ABNORMAL HIGH (ref 0.44–1.00)
GFR, Estimated: 54 mL/min — ABNORMAL LOW (ref >60)
Glucose, Bld: 146 mg/dL — ABNORMAL HIGH (ref 70–99)
Potassium: 4.4 mmol/L (ref 3.5–5.1)
Sodium: 130 mmol/L — ABNORMAL LOW (ref 135–145)
Total Bilirubin: 0.4 mg/dL (ref 0.0–1.2)
Total Protein: 6.8 g/dL (ref 6.5–8.1)

## 2024-09-26 LAB — LIPID PANEL
Cholesterol: 118 mg/dL (ref 0–200)
HDL: 54 mg/dL (ref >40)
LDL Cholesterol: 42 mg/dL (ref 0–99)
Total CHOL/HDL Ratio: 2.2 ratio
Triglycerides: 110 mg/dL (ref <150)
VLDL: 22 mg/dL (ref 0–40)

## 2024-09-26 LAB — CBC
HCT: 32.8 % — ABNORMAL LOW (ref 36.0–46.0)
Hemoglobin: 10.5 g/dL — ABNORMAL LOW (ref 12.0–15.0)
MCH: 26.2 pg (ref 26.0–34.0)
MCHC: 32 g/dL (ref 30.0–36.0)
MCV: 81.8 fL (ref 80.0–100.0)
Platelets: 218 K/uL (ref 150–400)
RBC: 4.01 MIL/uL (ref 3.87–5.11)
RDW: 14.7 % (ref 11.5–15.5)
WBC: 11.6 K/uL — ABNORMAL HIGH (ref 4.0–10.5)
nRBC: 0 % (ref 0.0–0.2)

## 2024-09-26 LAB — HEMOGLOBIN A1C
Hgb A1c MFr Bld: 7 % — ABNORMAL HIGH (ref 4.8–5.6)
Mean Plasma Glucose: 154.2 mg/dL

## 2024-10-21 ENCOUNTER — Other Ambulatory Visit: Payer: Self-pay

## 2024-10-22 ENCOUNTER — Encounter: Payer: Self-pay | Admitting: Adult Health

## 2024-10-22 ENCOUNTER — Non-Acute Institutional Stay (SKILLED_NURSING_FACILITY): Payer: Self-pay | Admitting: Adult Health

## 2024-10-22 DIAGNOSIS — N3281 Overactive bladder: Secondary | ICD-10-CM | POA: Diagnosis not present

## 2024-10-22 DIAGNOSIS — M5432 Sciatica, left side: Secondary | ICD-10-CM | POA: Diagnosis not present

## 2024-10-22 DIAGNOSIS — K219 Gastro-esophageal reflux disease without esophagitis: Secondary | ICD-10-CM

## 2024-10-22 DIAGNOSIS — G8929 Other chronic pain: Secondary | ICD-10-CM

## 2024-10-22 NOTE — Progress Notes (Signed)
 Location:  [facility] Room Number: North/115/W Place of Service:  SNF (31)   CODE STATUS: Full Code  Allergies  Allergen Reactions   Gabapentin     Lisinopril    Penicillins Nausea Only    Has patient had a PCN reaction causing immediate rash, facial/tongue/throat swelling, SOB or lightheadedness with hypotension: Yes Has patient had a PCN reaction causing severe rash involving mucus membranes or skin necrosis: No Has patient had a PCN reaction that required hospitalization No Has patient had a PCN reaction occurring within the last 10 years: No If all of the above answers are NO, then may proceed with Cephalosporin use.    Sulfa Antibiotics Nausea And Vomiting    Chief Complaint  Patient presents with   Medical Management of Chronic Issues          Gastroesophageal reflux disease without esophagitis: OAB Chronic generalized pain/left sciatic pain    HPI:  She is a 83 y.o. long term resident of this facility being seen for the management of her chronic illnesses:Gastroesophageal reflux disease without esophagitis: OAB Chronic generalized pain/left sciatic pain. Her pain is presently being managed; she continues to ambulate with a walker. She is not complaining of urinary urgency today.    Past Medical History:  Diagnosis Date   Anxiety    Bowel obstruction (HCC)    Burning with urination 01/04/2016   Diabetes mellitus with neuropathy    Genital herpes    GERD (gastroesophageal reflux disease)    Hematuria 01/04/2016   Herpes 12/22/2015   Hyperlipidemia    Hypertension    LLQ abdominal tenderness 01/04/2016   Neuropathy    Pelvic pressure in female 01/04/2016   Recurrent UTI    Sciatic nerve disease    Small bowel obstruction (HCC) 04/27/2016   Urinary frequency 01/04/2016   Urticaria     Past Surgical History:  Procedure Laterality Date   ABDOMINAL HYSTERECTOMY     APPLICATION OF INTRAOPERATIVE CT SCAN N/A 12/19/2018   Procedure:  APPLICATION OF INTRAOPERATIVE CT SCAN;  Surgeon: Unice Pac, MD;  Location: [facility] OR;  Service: Neurosurgery;  Laterality: N/A;   bowel obstruction     COLON SURGERY     blocked colon   POSTERIOR CERVICAL FUSION/FORAMINOTOMY N/A 12/19/2018   Procedure: Cervical Seven to Thoracic One Posterior cervical fusion;  Surgeon: Unice Pac, MD;  Location: [facility] OR;  Service: Neurosurgery;  Laterality: N/A;  Cervical Seven to Thoracic One Posterior cervical fusion    Social History   Socioeconomic History   Marital status: Widowed    Spouse name: Not on file   Number of children: Not on file   Years of education: Not on file   Highest education level: Not on file  Occupational History   Occupation: retired   Tobacco Use   Smoking status: Never   Smokeless tobacco: Current    Types: Snuff  Vaping Use   Vaping status: Never Used  Substance and Sexual Activity   Alcohol use: No   Drug use: No   Sexual activity: Not Currently    Birth control/protection: Surgical    Comment: hyst  Other Topics Concern   Not on file  Social History Narrative   Long term resident of [facility]    Social Drivers of Health   Financial Resource Strain: Not on file  Food Insecurity: Not on file  Transportation Needs: Not on file  Physical Activity: Not on file  Stress: Not on file  Social Connections: Not on  file  Intimate Partner Violence: Not on file   Family History  Problem Relation Age of Onset   Diabetes Mother    Heart disease Brother    Hypertension Brother    Diabetes Brother    Hypertension Daughter    Diabetes Brother    Diabetes Brother    Alzheimer's disease Brother    Anxiety disorder Daughter       VITAL SIGNS BP (!) 139/55   Pulse 63   Temp 98.2 F (36.8 C)   Resp 18   Ht 5' 1 (1.549 m)   Wt 209 lb (94.8 kg)   SpO2 98%   BMI 39.49 kg/m   Outpatient Encounter Medications as of 10/22/2024  Medication Sig   acetaminophen  (TYLENOL ) 650 MG CR tablet Take 650 mg by mouth 3  (three) times daily.   amLODipine  (NORVASC ) 10 MG tablet Take 1 tablet (10 mg total) by mouth daily.   aspirin  81 MG chewable tablet Chew 81 mg by mouth daily.   carvedilol (COREG) 6.25 MG tablet Take 6.25 mg by mouth 2 (two) times daily with a meal.   Cranberry 500 MG CAPS Take by mouth in the morning and at bedtime. For UTI prevention   DULoxetine  (CYMBALTA ) 60 MG capsule Take 60 mg by mouth daily. Pain management   insulin  aspart protamine - aspart (NOVOLOG  70/30 MIX) (70-30) 100 UNIT/ML FlexPen Inject 7 Units into the skin with breakfast, with lunch, and with evening meal.   Insulin  Glargine (BASAGLAR  KWIKPEN) 100 UNIT/ML Inject 17 Units into the skin at bedtime.   insulin  lispro (HUMALOG) 100 UNIT/ML injection Inject 7 Units into the skin 3 (three) times daily before meals.   Insulin  Pen Needle (BD AUTOSHIELD DUO) 30G X 5 MM MISC by Does not apply route. 3/16   linagliptin  (TRADJENTA ) 5 MG TABS tablet Take 5 mg by mouth daily.   losartan (COZAAR) 100 MG tablet Take 100 mg by mouth daily.   Melatonin 5 MG TABS Take 5 mg by mouth at bedtime.   meloxicam (MOBIC) 7.5 MG tablet Take 7.5 mg by mouth daily. tablet; 7.5 mg; amt: 1; oral At Bedtime 08:00 PM  tablet; 7.5 mg; oral Special Instructions: Tell resident is for her left inguinal and thigh pain Twice A Day 09:00 AM, 09:00 PM   mirabegron ER (MYRBETRIQ) 50 MG TB24 tablet Take 50 mg by mouth daily.   montelukast  (SINGULAIR ) 10 MG tablet Take 10 mg by mouth daily.   omeprazole (PRILOSEC) 20 MG capsule Take 20 mg by mouth daily.   polyethylene glycol (MIRALAX  / GLYCOLAX ) packet Take 17 g by mouth daily.   pregabalin  (LYRICA ) 25 MG capsule Take 1 capsule (25 mg total) by mouth 3 (three) times daily.   senna-docusate (SENOKOT-S) 8.6-50 MG tablet Take 1 tablet by mouth at bedtime.    simvastatin  (ZOCOR ) 10 MG tablet Take 10 mg by mouth at bedtime.    spironolactone (ALDACTONE) 25 MG tablet Take 25 mg by mouth daily.   triamcinolone   (NASACORT ) 55 MCG/ACT AERO nasal inhaler Place 1 spray into the nose daily.   valACYclovir  (VALTREX ) 500 MG tablet Take 500 mg by mouth daily.    No facility-administered encounter medications on file as of 10/22/2024.     SIGNIFICANT DIAGNOSTIC EXAMS  LABS REVIEWED PREVIOUS;     11-27-23: glucose 172; bun 16; creat 0.73; k+ 3.8; na++ 135; ca 8.6 gfr >60   03-18-24: wbc 10.8; hgb 11.1; hct 36.2; mcv 81.0 plt 258; glucose 173; bun 24;  creat 1.13 k+ 4.7; na++ 130; ca 9.3; gfr 49; protein 7.2 albumin 3.8; hgb A1c 9.4   04-01-24: glucose 174; bun 21; creat 1.02; k+ 4.4; na++ 132; ca 9.1; gfr 55 05-13-24: glucose 160; bun 22; creat 1.00; k+ 4.5; na++ 133; ca 9.1 gfr 56 06-03-24: glucose 124; bun 27; creat 1.19; k+ 4.6; na++ 129; ca 8.9 gfr 46  TODAY  06-10-24: glucose 125; bun 28; creat 1.09; k+ 4.5; na++ 131; ca 9.0; gfr 51  07-01-24: hgb A1c 7.2 07-04-24: urine micro-albumin: 3.6    Review of Systems  Constitutional:  Negative for malaise/fatigue.  Respiratory:  Negative for cough and shortness of breath.   Cardiovascular:  Negative for chest pain, palpitations and leg swelling.  Gastrointestinal:  Negative for abdominal pain, constipation and heartburn.  Musculoskeletal:  Negative for back pain, joint pain and myalgias.  Skin: Negative.   Neurological:  Negative for dizziness.  Psychiatric/Behavioral:  The patient is not nervous/anxious.     Physical Exam Constitutional:      General: She is not in acute distress.    Appearance: She is well-developed. She is obese. She is not diaphoretic.  Neck:     Thyroid : No thyromegaly.  Cardiovascular:     Rate and Rhythm: Normal rate and regular rhythm.     Pulses: Normal pulses.     Heart sounds: Normal heart sounds.  Pulmonary:     Effort: Pulmonary effort is normal. No respiratory distress.     Breath sounds: Normal breath sounds.  Abdominal:     General: Bowel sounds are normal. There is no distension.     Palpations: Abdomen is soft.      Tenderness: There is no abdominal tenderness.  Musculoskeletal:        General: Normal range of motion.     Cervical back: Neck supple.     Right lower leg: Edema present.     Left lower leg: Edema present.  Lymphadenopathy:     Cervical: No cervical adenopathy.  Skin:    General: Skin is warm and dry.  Neurological:     Mental Status: She is alert. Mental status is at baseline.     Comments: SLUMS 6/30  Psychiatric:        Mood and Affect: Mood normal.        ASSESSMENT/ PLAN:  TODAY  Gastroesophageal reflux disease without esophagitis: will continue prilosec 20 mg daily did not tolerate being off this medication  2. OAB will continue myrbetriq 50 mg daily   3. Chronic generalized pain/left sciatic pain: will continue tylenol  650 mg three times daily; lyrica  25 mg three times daily cymbalta  60 mg daily mobic 7.5 mg twice daily and has voltaren gel three times daily as needed.   PREVIOUS   4. Chronic non-seasonal allergic rhinitis: will continue nasocort daily   5. Herpes: no recent outbreaks; will continue valtrex  500 mg daily  6. Vitamin B 12 deficiency: level is 806   7. Chronic constipation: will continue miralax  daily and senna s daily   8. Hypertension associated with type 2 diabetes mellitus: b/p 139/55 will continue norvasc  10 mg daily; coreg 6.25 mg twice daily and cozaar 100 mg daily   9. Major depression recurrent chronic: will continue cymbalta  60 mg daily (failed one wean and also takes for pain management) will continue zoloft  25 mg daily;   10. CKD stage 3 due to type 2 diabetes mellitus: bun 28; creat 1.09; gfr 51  11. Major neurocognitive deficits: SLUMS 6/30  12. Normocytic anemia: hgb 10.0; hct 32.3  13. Aortic atherosclerosis (ct 12-18-19) is on asa and statin  14. Dyslipidemia associated with type 2 diabetes mellitus: ldl 55 will continue zocor  10 mg daily   15. Type 2 diabetes mellitus with peripheral angiopathy: hgb A1c 7.2; will continue  metformin  1 gm twice daily; tradjenta  5 mg daily; glargine 17 units nightly. Is on asa statin arb     Barnie Seip NP [facility]  call [phone]

## 2024-10-24 ENCOUNTER — Other Ambulatory Visit (HOSPITAL_COMMUNITY)
Admission: RE | Admit: 2024-10-24 | Discharge: 2024-10-24 | Disposition: A | Discharge status: Home / Self Care | Source: Skilled Nursing Facility | Attending: Adult Health | Admitting: Adult Health

## 2024-10-24 DIAGNOSIS — E1142 Type 2 diabetes mellitus with diabetic polyneuropathy: Secondary | ICD-10-CM | POA: Insufficient documentation

## 2024-10-24 LAB — COMPREHENSIVE METABOLIC PANEL WITH GFR
ALT: 12 U/L (ref 0–44)
AST: 17 U/L (ref 15–41)
Albumin: 3.9 g/dL (ref 3.5–5.0)
Alkaline Phosphatase: 104 U/L (ref 38–126)
Anion gap: 11 (ref 5–15)
BUN: 22 mg/dL (ref 8–23)
CO2: 26 mmol/L (ref 22–32)
Calcium: 8.9 mg/dL (ref 8.9–10.3)
Chloride: 97 mmol/L — ABNORMAL LOW (ref 98–111)
Creatinine, Ser: 1.08 mg/dL — ABNORMAL HIGH (ref 0.44–1.00)
GFR, Estimated: 51 mL/min — ABNORMAL LOW (ref >60)
Glucose, Bld: 129 mg/dL — ABNORMAL HIGH (ref 70–99)
Potassium: 4.5 mmol/L (ref 3.5–5.1)
Sodium: 134 mmol/L — ABNORMAL LOW (ref 135–145)
Total Bilirubin: 0.2 mg/dL (ref 0.0–1.2)
Total Protein: 6.5 g/dL (ref 6.5–8.1)

## 2024-10-24 LAB — CBC
HCT: 33.9 % — ABNORMAL LOW (ref 36.0–46.0)
Hemoglobin: 10.7 g/dL — ABNORMAL LOW (ref 12.0–15.0)
MCH: 26.1 pg (ref 26.0–34.0)
MCHC: 31.6 g/dL (ref 30.0–36.0)
MCV: 82.7 fL (ref 80.0–100.0)
Platelets: 235 K/uL (ref 150–400)
RBC: 4.1 MIL/uL (ref 3.87–5.11)
RDW: 15.6 % — ABNORMAL HIGH (ref 11.5–15.5)
WBC: 9.5 K/uL (ref 4.0–10.5)
nRBC: 0 % (ref 0.0–0.2)

## 2024-10-24 LAB — HEMOGLOBIN A1C
Hgb A1c MFr Bld: 7.5 % — ABNORMAL HIGH (ref 4.8–5.6)
Mean Plasma Glucose: 168.55 mg/dL

## 2024-10-27 ENCOUNTER — Encounter (HOSPITAL_COMMUNITY): Payer: Self-pay

## 2024-10-27 ENCOUNTER — Emergency Department (HOSPITAL_COMMUNITY)

## 2024-10-27 ENCOUNTER — Emergency Department (HOSPITAL_COMMUNITY)
Admission: EM | Admit: 2024-10-27 | Discharge: 2024-10-27 | Disposition: A | Discharge status: Home / Self Care | Attending: Emergency Medicine | Admitting: Emergency Medicine

## 2024-10-27 ENCOUNTER — Other Ambulatory Visit: Payer: Self-pay

## 2024-10-27 DIAGNOSIS — M5416 Radiculopathy, lumbar region: Secondary | ICD-10-CM | POA: Insufficient documentation

## 2024-10-27 DIAGNOSIS — E119 Type 2 diabetes mellitus without complications: Secondary | ICD-10-CM | POA: Insufficient documentation

## 2024-10-27 DIAGNOSIS — Z7982 Long term (current) use of aspirin: Secondary | ICD-10-CM | POA: Diagnosis not present

## 2024-10-27 DIAGNOSIS — M545 Low back pain, unspecified: Secondary | ICD-10-CM | POA: Diagnosis present

## 2024-10-27 DIAGNOSIS — Z794 Long term (current) use of insulin: Secondary | ICD-10-CM | POA: Diagnosis not present

## 2024-10-27 MED ORDER — HYDROCODONE-ACETAMINOPHEN 5-325 MG PO TABS
1.0000 | ORAL_TABLET | Freq: Once | ORAL | Status: AC
  Administered 2024-10-27: 1 via ORAL
  Filled 2024-10-27: qty 1

## 2024-10-27 MED ORDER — OXYCODONE HCL 5 MG PO TABS: 5.0000 mg | ORAL_TABLET | ORAL | 0 refills | Status: DC | PRN

## 2024-10-27 MED ORDER — OXYCODONE HCL 5 MG PO TABS
5.0000 mg | ORAL_TABLET | ORAL | Status: AC
  Administered 2024-10-27: 5 mg via ORAL
  Filled 2024-10-27: qty 1

## 2024-10-27 MED ORDER — PREGABALIN 25 MG PO CAPS: 50.0000 mg | ORAL_CAPSULE | Freq: Three times a day (TID) | ORAL | 0 refills | Status: DC

## 2024-10-27 MED ORDER — PREGABALIN 50 MG PO CAPS
50.0000 mg | ORAL_CAPSULE | ORAL | Status: AC
  Administered 2024-10-27: 50 mg via ORAL
  Filled 2024-10-27: qty 1

## 2024-10-27 MED ORDER — LIDOCAINE 5 % EX PTCH
1.0000 | MEDICATED_PATCH | CUTANEOUS | Status: DC
  Administered 2024-10-27: 1 via TRANSDERMAL
  Filled 2024-10-27: qty 1

## 2024-10-27 NOTE — ED Triage Notes (Addendum)
 Pt from [facility]. Chronic lower back pain, Friday the pain became worse radiating to left lower leg. Denies urinary changes. Unaware of last bowel movement.   Pt takes tylenol , family states the tylenol  doesn't really help.

## 2024-10-27 NOTE — Discharge Instructions (Signed)
 You were seen for your back pain (lumbar radiculopathy) in the emergency department.   At home, please use over-the-counter Tylenol , meloxicam, and lidocaine  patches.  Increase your pregabalin  to 50 mg 3 times daily.  You may also use the oxycodone  we have prescribed you as needed for pain.  Do not take the oxycodone  before driving or operating heavy machinery.  Follow-up with your primary doctor in 2-3 days regarding your visit.  Follow-up with the spine clinic as soon as possible regarding your symptoms.  Return immediately to the emergency department if you experience any of the following: Numbness or weakness of your legs, bowel or bladder incontinence, numbness while wiping after pooping or urinating, or any other concerning symptoms.    Thank you for visiting our Emergency Department. It was a pleasure taking care of you today.

## 2024-10-27 NOTE — ED Provider Notes (Signed)
 [place] EMERGENCY DEPARTMENT AT [facility] Provider Note   CSN: [id number] Arrival date & time: 10/27/24  9275     Patient presents with: Back Pain   [name]  CHARLY [name] is a 83 y.o. female.  {Add pertinent medical, surgical, social history, OB history to HPI:4685} 83 year old female history of lumbar radiculopathy and diabetes who presents to the emergency department with left leg pain.  History obtained per patient and her daughter.  Says that she has a history of sciatica and lumbar radiculopathy that has been present for years.  Occasionally, pain flares.  Started having a flare on Friday where it hurts from her left upper leg down to her toes.  No known injuries.  No bowel or bladder incontinence.  No leg weakness numbness.  Currently is on pregabalin  25 mg 3 times daily and meloxicam 7.5 mg twice daily.  Has been seen for this multiple times in the past.  No back or hip surgeries.  Not on blood thinners.  No fevers.  No history of IV drug use.       Prior to Admission medications   Medication Sig Start Date End Date Taking? Authorizing Provider  acetaminophen  (TYLENOL ) 650 MG CR tablet Take 650 mg by mouth 3 (three) times daily. 01/25/21   [provider]  amLODipine  (NORVASC ) 10 MG tablet Take 1 tablet (10 mg total) by mouth daily. 12/16/19   Odis Burnard Jansky, PA-C  aspirin  81 MG chewable tablet Chew 81 mg by mouth daily.    [provider]  carvedilol (COREG) 6.25 MG tablet Take 6.25 mg by mouth 2 (two) times daily with a meal.    [provider]  Cranberry 500 MG CAPS Take by mouth in the morning and at bedtime. For UTI prevention    [provider]  DULoxetine  (CYMBALTA ) 60 MG capsule Take 60 mg by mouth daily. Pain management    [provider]  insulin  aspart protamine - aspart (NOVOLOG  70/30 MIX) (70-30) 100 UNIT/ML FlexPen Inject 7 Units into the skin with breakfast, with lunch, and with evening meal.    [provider]  Insulin  Glargine (BASAGLAR  KWIKPEN) 100 UNIT/ML Inject 17 Units into the skin at bedtime.    [provider]  insulin  lispro (HUMALOG) 100 UNIT/ML injection Inject 7 Units into the skin 3 (three) times daily before meals.    [provider]  Insulin  Pen Needle (BD AUTOSHIELD DUO) 30G X 5 MM MISC by Does not apply route. 3/16    [provider]  linagliptin  (TRADJENTA ) 5 MG TABS tablet Take 5 mg by mouth daily.    [provider]  losartan (COZAAR) 100 MG tablet Take 100 mg by mouth daily.    [provider]  Melatonin 5 MG TABS Take 5 mg by mouth at bedtime.    [provider]  meloxicam (MOBIC) 7.5 MG tablet Take 7.5 mg by mouth daily. tablet; 7.5 mg; amt: 1; oral At Bedtime 08:00 PM  tablet; 7.5 mg; oral Special Instructions: Tell resident is for her left inguinal and thigh pain Twice A Day 09:00 AM, 09:00 PM    [provider]  mirabegron ER (MYRBETRIQ) 50 MG TB24 tablet Take 50 mg by mouth daily.    [provider]  montelukast  (SINGULAIR ) 10 MG tablet Take 10 mg by mouth daily. 06/05/23   [provider]  omeprazole (PRILOSEC) 20 MG capsule Take 20 mg by mouth daily.    [provider]  polyethylene  glycol (MIRALAX  / GLYCOLAX ) packet Take 17 g by mouth daily.    [provider]  pregabalin  (LYRICA ) 25 MG capsule Take 1 capsule (25 mg total) by mouth 3 (three) times daily. 09/11/24   Landy Barnie RAMAN, NP  senna-docusate (SENOKOT-S) 8.6-50 MG tablet Take 1 tablet by mouth at bedtime.     [provider]  sertraline  (ZOLOFT ) 25 MG tablet Take 25 mg by mouth daily.    [provider]  simvastatin  (ZOCOR ) 10 MG tablet Take 10 mg by mouth at bedtime.     [provider]  spironolactone (ALDACTONE) 25 MG tablet Take 25 mg by mouth daily.    [provider]  triamcinolone  (NASACORT ) 55 MCG/ACT AERO nasal inhaler Place 1 spray into the nose daily.     [provider]  valACYclovir  (VALTREX ) 500 MG tablet Take 500 mg by mouth daily.  05/05/16   [provider]    Allergies: Gabapentin , Lisinopril, Penicillins, and Sulfa antibiotics    Review of Systems  Updated Vital Signs BP (!) 154/73   Pulse 81   Temp 98.1 F (36.7 C) (Oral)   Resp (!) 25   Ht 5' 1 (1.549 m)   SpO2 96%   BMI 39.49 kg/m   Physical Exam Cardiovascular:     Comments: DP pulses 2+ in both feet.  Both feet appear warm well-perfused. Musculoskeletal:     Comments: No spinal midline TTP in lumbar spine. No stepoffs noted.   Motor: Muscle bulk and tone are normal. Strength is 5/5 in hip flexion, knee flexion and extension, ankle dorsiflexion and plantar flexion bilaterally. Full strength of great toe dorsiflexion bilaterally.  Sensory: Intact sensation to light touch in L2 though S1 dermatomes bilaterally.   Full range of motion of the left hip.  Skin:    Comments: No overlying skin changes around the left hip or on lower back.     (all labs ordered are listed, but only abnormal results are displayed) Labs Reviewed - No data to display  EKG: None  Radiology: No results found.  {Document cardiac monitor, telemetry assessment procedure when appropriate:32947} Procedures   Medications Ordered in the ED  HYDROcodone -acetaminophen  (NORCO/VICODIN) 5-325 MG per tablet 1 tablet (has no administration in time range)  lidocaine  (LIDODERM ) 5 % 1 patch (has no administration in time range)      {Click here for ABCD2, HEART and other calculators REFRESH Note before signing:1}                              Medical Decision Making Amount and/or Complexity of Data Reviewed Radiology: ordered.  Risk Prescription drug management.   ***  {Document critical care time when appropriate  Document review of labs and clinical decision tools ie CHADS2VASC2, etc  Document your independent review of radiology images and any outside records   Document your discussion with family members, caretakers and with consultants  Document social determinants of health affecting pt's care  Document your decision making why or why not admission, treatments were needed:32947:::1}   Final diagnoses:  None    ED Discharge Orders     None

## 2024-10-28 ENCOUNTER — Non-Acute Institutional Stay (SKILLED_NURSING_FACILITY): Payer: Self-pay | Admitting: Adult Health

## 2024-10-28 ENCOUNTER — Encounter: Payer: Self-pay | Admitting: Adult Health

## 2024-10-28 DIAGNOSIS — M5416 Radiculopathy, lumbar region: Secondary | ICD-10-CM | POA: Diagnosis not present

## 2024-10-28 NOTE — Progress Notes (Unsigned)
 Location:  [facility] Room Number: North/115/W Place of Service:  SNF (31)   CODE STATUS: Full Code  Allergies  Allergen Reactions   Gabapentin     Lisinopril    Penicillins Nausea Only    Has patient had a PCN reaction causing immediate rash, facial/tongue/throat swelling, SOB or lightheadedness with hypotension: Yes Has patient had a PCN reaction causing severe rash involving mucus membranes or skin necrosis: No Has patient had a PCN reaction that required hospitalization No Has patient had a PCN reaction occurring within the last 10 years: No If all of the above answers are NO, then may proceed with Cephalosporin use.    Sulfa Antibiotics Nausea And Vomiting    Chief Complaint  Patient presents with   Hospitalization Follow-up    ED follow up     HPI:    Past Medical History:  Diagnosis Date   Anxiety    Bowel obstruction (HCC)    Burning with urination 01/04/2016   Diabetes mellitus with neuropathy    Genital herpes    GERD (gastroesophageal reflux disease)    Hematuria 01/04/2016   Herpes 12/22/2015   Hyperlipidemia    Hypertension    LLQ abdominal tenderness 01/04/2016   Neuropathy    Pelvic pressure in female 01/04/2016   Recurrent UTI    Sciatic nerve disease    Small bowel obstruction (HCC) 04/27/2016   Urinary frequency 01/04/2016   Urticaria     Past Surgical History:  Procedure Laterality Date   ABDOMINAL HYSTERECTOMY     APPLICATION OF INTRAOPERATIVE CT SCAN N/A 12/19/2018   Procedure: APPLICATION OF INTRAOPERATIVE CT SCAN;  Surgeon: Unice Pac, MD;  Location: [facility] OR;  Service: Neurosurgery;  Laterality: N/A;   bowel obstruction     COLON SURGERY     blocked colon   POSTERIOR CERVICAL FUSION/FORAMINOTOMY N/A 12/19/2018   Procedure: Cervical Seven to Thoracic One Posterior cervical fusion;  Surgeon: Unice Pac, MD;  Location: [facility] OR;  Service: Neurosurgery;  Laterality: N/A;  Cervical Seven to Thoracic One Posterior  cervical fusion    Social History   Socioeconomic History   Marital status: Widowed    Spouse name: Not on file   Number of children: Not on file   Years of education: Not on file   Highest education level: Not on file  Occupational History   Occupation: retired   Tobacco Use   Smoking status: Never   Smokeless tobacco: Current    Types: Snuff  Vaping Use   Vaping status: Never Used  Substance and Sexual Activity   Alcohol use: No   Drug use: No   Sexual activity: Not Currently    Birth control/protection: Surgical    Comment: hyst  Other Topics Concern   Not on file  Social History Narrative   Long term resident of [facility]    Social Drivers of Health   Financial Resource Strain: Not on file  Food Insecurity: Not on file  Transportation Needs: Not on file  Physical Activity: Not on file  Stress: Not on file  Social Connections: Not on file  Intimate Partner Violence: Not on file   Family History  Problem Relation Age of Onset   Diabetes Mother    Heart disease Brother    Hypertension Brother    Diabetes Brother    Hypertension Daughter    Diabetes Brother    Diabetes Brother    Alzheimer's disease Brother    Anxiety disorder Daughter  VITAL SIGNS BP (!) 153/97   Pulse 70   Temp 97.8 F (36.6 C)   Resp 17   Ht 5' 1 (1.549 m)   Wt 209 lb (94.8 kg)   SpO2 98%   BMI 39.49 kg/m   Outpatient Encounter Medications as of 10/28/2024  Medication Sig   acetaminophen  (TYLENOL ) 650 MG CR tablet Take 650 mg by mouth 3 (three) times daily.   amLODipine  (NORVASC ) 10 MG tablet Take 1 tablet (10 mg total) by mouth daily.   aspirin  81 MG chewable tablet Chew 81 mg by mouth daily.   carvedilol (COREG) 6.25 MG tablet Take 6.25 mg by mouth 2 (two) times daily with a meal.   Cranberry 500 MG CAPS Take by mouth in the morning and at bedtime. For UTI prevention   DULoxetine  (CYMBALTA ) 60 MG capsule Take 60 mg by mouth daily. Pain management   insulin  aspart  protamine - aspart (NOVOLOG  70/30 MIX) (70-30) 100 UNIT/ML FlexPen Inject 7 Units into the skin with breakfast, with lunch, and with evening meal.   insulin  lispro (HUMALOG) 100 UNIT/ML injection Inject 7 Units into the skin 3 (three) times daily before meals.   Insulin  Pen Needle (BD AUTOSHIELD DUO) 30G X 5 MM MISC by Does not apply route. 3/16   linagliptin  (TRADJENTA ) 5 MG TABS tablet Take 5 mg by mouth daily.   losartan (COZAAR) 100 MG tablet Take 100 mg by mouth daily.   Melatonin 5 MG TABS Take 5 mg by mouth at bedtime.   meloxicam (MOBIC) 7.5 MG tablet Take 7.5 mg by mouth daily. tablet; 7.5 mg; amt: 1; oral At Bedtime 08:00 PM  tablet; 7.5 mg; oral Special Instructions: Tell resident is for her left inguinal and thigh pain Twice A Day 09:00 AM, 09:00 PM   mirabegron ER (MYRBETRIQ) 50 MG TB24 tablet Take 50 mg by mouth daily.   montelukast  (SINGULAIR ) 10 MG tablet Take 10 mg by mouth daily.   omeprazole (PRILOSEC) 20 MG capsule Take 20 mg by mouth daily.   oxyCODONE  (ROXICODONE ) 5 MG immediate release tablet Take 1 tablet (5 mg total) by mouth every 4 (four) hours as needed for severe pain (pain score 7-10).   polyethylene glycol (MIRALAX  / GLYCOLAX ) packet Take 17 g by mouth daily.   pregabalin  (LYRICA ) 25 MG capsule Take 2 capsules (50 mg total) by mouth 3 (three) times daily.   senna-docusate (SENOKOT-S) 8.6-50 MG tablet Take 1 tablet by mouth at bedtime.    sertraline  (ZOLOFT ) 25 MG tablet Take 25 mg by mouth daily.   simvastatin  (ZOCOR ) 10 MG tablet Take 10 mg by mouth at bedtime.    spironolactone (ALDACTONE) 25 MG tablet Take 25 mg by mouth daily.   triamcinolone  (NASACORT ) 55 MCG/ACT AERO nasal inhaler Place 1 spray into the nose daily.   valACYclovir  (VALTREX ) 500 MG tablet Take 500 mg by mouth daily.    Insulin  Glargine (BASAGLAR  KWIKPEN) 100 UNIT/ML Inject 17 Units into the skin at bedtime. (Patient not taking: Reported on 10/28/2024)   No facility-administered  encounter medications on file as of 10/28/2024.     SIGNIFICANT DIAGNOSTIC EXAMS       ASSESSMENT/ PLAN:     [name] Seip NP [facility]  Contact [phone] Monday through Friday 8am- 5pm  After hours call [phone]

## 2024-11-15 ENCOUNTER — Encounter: Payer: Self-pay | Admitting: Adult Health

## 2024-11-15 ENCOUNTER — Non-Acute Institutional Stay (SKILLED_NURSING_FACILITY): Payer: Self-pay | Admitting: Adult Health

## 2024-11-15 DIAGNOSIS — E1151 Type 2 diabetes mellitus with diabetic peripheral angiopathy without gangrene: Secondary | ICD-10-CM

## 2024-11-15 DIAGNOSIS — I152 Hypertension secondary to endocrine disorders: Secondary | ICD-10-CM

## 2024-11-15 DIAGNOSIS — E1122 Type 2 diabetes mellitus with diabetic chronic kidney disease: Secondary | ICD-10-CM

## 2024-11-15 NOTE — Progress Notes (Signed)
 Location:  [facility] Room Number: North/115/W Place of Service:  SNF (31)   CODE STATUS: Full Code  Allergies  Allergen Reactions   Gabapentin     Lisinopril    Penicillins Nausea Only    Has patient had a PCN reaction causing immediate rash, facial/tongue/throat swelling, SOB or lightheadedness with hypotension: Yes Has patient had a PCN reaction causing severe rash involving mucus membranes or skin necrosis: No Has patient had a PCN reaction that required hospitalization No Has patient had a PCN reaction occurring within the last 10 years: No If all of the above answers are NO, then may proceed with Cephalosporin use.    Sulfa Antibiotics Nausea And Vomiting    Chief Complaint  Patient presents with   Care Plan Meeting    HPI:  We have come together for her care plan meeting. BIMS 12/15 mood 6/30: decreased energy and restlessness. She uses walker without falls. She is independent with her adl care. She is frequently incontinent of bladder and bowel. Dietary: feeds self; regular diet weight 209 pounds; appetite 76-100%. Therapy: none at this time. Activities: does participate. 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  CKD stage 3 due to type 2 diabetes mellitus  Past Medical History:  Diagnosis Date   Anxiety    Bowel obstruction (HCC)    Burning with urination 01/04/2016   Diabetes mellitus with neuropathy    Genital herpes    GERD (gastroesophageal reflux disease)    Hematuria 01/04/2016   Herpes 12/22/2015   Hyperlipidemia    Hypertension    LLQ abdominal tenderness 01/04/2016   Neuropathy    Pelvic pressure in female 01/04/2016   Recurrent UTI    Sciatic nerve disease    Small bowel obstruction (HCC) 04/27/2016   Urinary frequency 01/04/2016   Urticaria     Past Surgical History:  Procedure  Laterality Date   ABDOMINAL HYSTERECTOMY     APPLICATION OF INTRAOPERATIVE CT SCAN N/A 12/19/2018   Procedure: APPLICATION OF INTRAOPERATIVE CT SCAN;  Surgeon: Unice Pac, MD;  Location: [facility] OR;  Service: Neurosurgery;  Laterality: N/A;   bowel obstruction     COLON SURGERY     blocked colon   POSTERIOR CERVICAL FUSION/FORAMINOTOMY N/A 12/19/2018   Procedure: Cervical Seven to Thoracic One Posterior cervical fusion;  Surgeon: Unice Pac, MD;  Location: [facility] OR;  Service: Neurosurgery;  Laterality: N/A;  Cervical Seven to Thoracic One Posterior cervical fusion    Social History   Socioeconomic History   Marital status: Widowed    Spouse name: Not on file   Number of children: Not on file   Years of education: Not on file   Highest education level: Not on file  Occupational History   Occupation: retired   Tobacco Use   Smoking status: Never   Smokeless tobacco: Current    Types: Snuff  Vaping Use   Vaping status: Never Used  Substance and Sexual Activity   Alcohol use: No   Drug use: No   Sexual activity: Not Currently    Birth control/protection: Surgical    Comment: hyst  Other Topics Concern   Not on file  Social History Narrative   Long term resident of [facility]    Social Drivers of Health   Financial Resource Strain: Not on file  Food Insecurity: Not on file  Transportation Needs: Not on  file  Physical Activity: Not on file  Stress: Not on file  Social Connections: Not on file  Intimate Partner Violence: Not on file   Family History  Problem Relation Age of Onset   Diabetes Mother    Heart disease Brother    Hypertension Brother    Diabetes Brother    Hypertension Daughter    Diabetes Brother    Diabetes Brother    Alzheimer's disease Brother    Anxiety disorder Daughter       VITAL SIGNS BP (!) 146/72   Pulse 60   Temp (!) 97.4 F (36.3 C)   Resp (!) 22   Ht 5' 1 (1.549 m)   Wt 206 lb 9.6 oz (93.7 kg)   SpO2 99%   BMI 39.04 kg/m   Outpatient  Encounter Medications as of 11/15/2024  Medication Sig   acetaminophen  (TYLENOL ) 650 MG CR tablet Take 650 mg by mouth 3 (three) times daily.   amLODipine  (NORVASC ) 10 MG tablet Take 1 tablet (10 mg total) by mouth daily.   aspirin  81 MG chewable tablet Chew 81 mg by mouth daily.   carvedilol (COREG) 6.25 MG tablet Take 6.25 mg by mouth 2 (two) times daily with a meal.   Cranberry 500 MG CAPS Take by mouth in the morning and at bedtime. For UTI prevention   DULoxetine  (CYMBALTA ) 60 MG capsule Take 60 mg by mouth daily. Pain management   insulin  aspart (NOVOLOG  FLEXPEN) 100 UNIT/ML FlexPen Inject into the skin as directed. nsulin pen; 100 unit/mL (3 mL); amt: 7 units; subcutaneous With Meals 08:30 AM, 12:00 PM, 05:00 PM   Insulin  Glargine (BASAGLAR  KWIKPEN) 100 UNIT/ML Inject 17 Units into the skin at bedtime.   Insulin  Pen Needle (BD AUTOSHIELD DUO) 30G X 5 MM MISC by Does not apply route. 3/16   linagliptin  (TRADJENTA ) 5 MG TABS tablet Take 5 mg by mouth daily.   losartan (COZAAR) 100 MG tablet Take 100 mg by mouth daily.   Melatonin 5 MG TABS Take 5 mg by mouth at bedtime.   meloxicam (MOBIC) 7.5 MG tablet Take 7.5 mg by mouth 2 (two) times daily.   mirabegron ER (MYRBETRIQ) 50 MG TB24 tablet Take 50 mg by mouth daily.   montelukast  (SINGULAIR ) 10 MG tablet Take 10 mg by mouth daily.   omeprazole (PRILOSEC) 20 MG capsule Take 20 mg by mouth daily.   polyethylene glycol (MIRALAX  / GLYCOLAX ) packet Take 17 g by mouth daily.   pregabalin  (LYRICA ) 25 MG capsule Take 25 mg by mouth 3 (three) times daily.   senna-docusate (SENOKOT-S) 8.6-50 MG tablet Take 1 tablet by mouth at bedtime.    sertraline  (ZOLOFT ) 25 MG tablet Take 25 mg by mouth daily.   simvastatin  (ZOCOR ) 10 MG tablet Take 10 mg by mouth at bedtime.    spironolactone (ALDACTONE) 25 MG tablet Take 25 mg by mouth daily.   triamcinolone  (NASACORT ) 55 MCG/ACT AERO nasal inhaler Place 1 spray into the nose daily.   valACYclovir   (VALTREX ) 500 MG tablet Take 500 mg by mouth daily.    No facility-administered encounter medications on file as of 11/15/2024.     SIGNIFICANT DIAGNOSTIC EXAMS  LABS REVIEWED PREVIOUS;     11-27-23: glucose 172; bun 16; creat 0.73; k+ 3.8; na++ 135; ca 8.6 gfr >60   03-18-24: wbc 10.8; hgb 11.1; hct 36.2; mcv 81.0 plt 258; glucose 173; bun 24; creat 1.13 k+ 4.7; na++ 130; ca 9.3; gfr 49; protein 7.2 albumin 3.8; hgb  A1c 9.4   04-01-24: glucose 174; bun 21; creat 1.02; k+ 4.4; na++ 132; ca 9.1; gfr 55 05-13-24: glucose 160; bun 22; creat 1.00; k+ 4.5; na++ 133; ca 9.1 gfr 56 06-03-24: glucose 124; bun 27; creat 1.19; k+ 4.6; na++ 129; ca 8.9 gfr 46 06-10-24: glucose 125; bun 28; creat 1.09; k+ 4.5; na++ 131; ca 9.0; gfr 51  07-01-24: hgb A1c 7.2 07-04-24: urine micro-albumin: 3.6   NO NEW LABS    Review of Systems  Constitutional:  Negative for malaise/fatigue.  Respiratory:  Negative for cough and shortness of breath.   Cardiovascular:  Negative for chest pain, palpitations and leg swelling.  Gastrointestinal:  Negative for abdominal pain, constipation and heartburn.  Musculoskeletal:  Negative for back pain, joint pain and myalgias.  Skin: Negative.   Neurological:  Negative for dizziness.  Psychiatric/Behavioral:  The patient is not nervous/anxious.     Physical Exam Constitutional:      General: She is not in acute distress.    Appearance: She is well-developed. She is not diaphoretic.  Neck:     Thyroid : No thyromegaly.  Cardiovascular:     Rate and Rhythm: Normal rate and regular rhythm.     Heart sounds: Normal heart sounds.  Pulmonary:     Effort: Pulmonary effort is normal. No respiratory distress.     Breath sounds: Normal breath sounds.  Abdominal:     General: Bowel sounds are normal. There is no distension.     Palpations: Abdomen is soft.     Tenderness: There is no abdominal tenderness.  Musculoskeletal:        General: Normal range of motion.     Cervical back:  Neck supple.     Right lower leg: No edema.     Left lower leg: No edema.  Lymphadenopathy:     Cervical: No cervical adenopathy.  Skin:    General: Skin is warm and dry.  Neurological:     Mental Status: She is alert. Mental status is at baseline.  Psychiatric:        Mood and Affect: Mood normal.      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   Time spent with patient: 35 minutes: adl care; medications; dietary; activities.    Barnie Seip NP [facility]   call [phone]

## 2024-11-19 LAB — HM DIABETES EYE EXAM

## 2024-12-04 ENCOUNTER — Encounter: Payer: Self-pay | Admitting: Adult Health

## 2024-12-04 ENCOUNTER — Non-Acute Institutional Stay (SKILLED_NURSING_FACILITY): Payer: Self-pay | Admitting: Adult Health

## 2024-12-04 DIAGNOSIS — J3089 Other allergic rhinitis: Secondary | ICD-10-CM | POA: Diagnosis not present

## 2024-12-04 DIAGNOSIS — E538 Deficiency of other specified B group vitamins: Secondary | ICD-10-CM

## 2024-12-04 DIAGNOSIS — B009 Herpesviral infection, unspecified: Secondary | ICD-10-CM | POA: Diagnosis not present

## 2024-12-04 NOTE — Progress Notes (Signed)
 " Location:  [facility] Room Number: 1 W Place of Service:  SNF (31)   CODE STATUS: Full Code   Allergies[1]  Chief Complaint  Patient presents with   Medical Management of Chronic Issues           Chronic non seasonal allergic rhinitis:  herpes:  Vitamin B 12 deficiency    HPI:  She is a 83 y.o. long term resident of this facility being seen for the management of her chronic illnesses:Chronic non seasonal allergic rhinitis:  herpes:  Vitamin B 12 deficiency. There are no reports of pain present. She continues to ambulate daily; there are no reports of anxiety or depressive thoughts.    Past Medical History:  Diagnosis Date   Anxiety    Bowel obstruction (HCC)    Burning with urination 01/04/2016   Diabetes mellitus with neuropathy    Genital herpes    GERD (gastroesophageal reflux disease)    Hematuria 01/04/2016   Herpes 12/22/2015   Hyperlipidemia    Hypertension    LLQ abdominal tenderness 01/04/2016   Neuropathy    Pelvic pressure in female 01/04/2016   Recurrent UTI    Sciatic nerve disease    Small bowel obstruction (HCC) 04/27/2016   Urinary frequency 01/04/2016   Urticaria     Past Surgical History:  Procedure Laterality Date   ABDOMINAL HYSTERECTOMY     APPLICATION OF INTRAOPERATIVE CT SCAN N/A 12/19/2018   Procedure: APPLICATION OF INTRAOPERATIVE CT SCAN;  Surgeon: Unice Pac, MD;  Location: [facility] OR;  Service: Neurosurgery;  Laterality: N/A;   bowel obstruction     COLON SURGERY     blocked colon   POSTERIOR CERVICAL FUSION/FORAMINOTOMY N/A 12/19/2018   Procedure: Cervical Seven to Thoracic One Posterior cervical fusion;  Surgeon: Unice Pac, MD;  Location: [facility] OR;  Service: Neurosurgery;  Laterality: N/A;  Cervical Seven to Thoracic One Posterior cervical fusion    Social History   Socioeconomic History   Marital status: Widowed    Spouse name: Not on file   Number of children: Not on file   Years of education: Not on  file   Highest education level: Not on file  Occupational History   Occupation: retired   Tobacco Use   Smoking status: Never   Smokeless tobacco: Current    Types: Snuff  Vaping Use   Vaping status: Never Used  Substance and Sexual Activity   Alcohol use: No   Drug use: No   Sexual activity: Not Currently    Birth control/protection: Surgical    Comment: hyst  Other Topics Concern   Not on file  Social History Narrative   Long term resident of [facility]    Social Drivers of Health   Tobacco Use: High Risk (12/04/2024)   Patient History    Smoking Tobacco Use: Never    Smokeless Tobacco Use: Current    Passive Exposure: Not on file  Financial Resource Strain: Not on file  Food Insecurity: Not on file  Transportation Needs: Not on file  Physical Activity: Not on file  Stress: Not on file  Social Connections: Not on file  Intimate Partner Violence: Not on file  Depression (EYV7-0): Low Risk (04/10/2024)   Depression (PHQ2-9)    PHQ-2 Score: 0  Alcohol Screen: Not on file  Housing: Not on file  Utilities: Not on file  Health Literacy: Not on file   Family History  Problem Relation Age of Onset   Diabetes Mother  Heart disease Brother    Hypertension Brother    Diabetes Brother    Hypertension Daughter    Diabetes Brother    Diabetes Brother    Alzheimer's disease Brother    Anxiety disorder Daughter       VITAL SIGNS BP 118/76   Pulse 78   Ht 5' 1 (1.549 m)   Wt 206 lb 9.6 oz (93.7 kg)   SpO2 98%   BMI 39.04 kg/m   Outpatient Encounter Medications as of 12/04/2024  Medication Sig   acetaminophen  (TYLENOL ) 650 MG CR tablet Take 650 mg by mouth 3 (three) times daily.   amLODipine  (NORVASC ) 10 MG tablet Take 1 tablet (10 mg total) by mouth daily.   aspirin  81 MG chewable tablet Chew 81 mg by mouth daily.   carvedilol (COREG) 6.25 MG tablet Take 6.25 mg by mouth 2 (two) times daily with a meal.   Cranberry 500 MG CAPS Take by mouth in the morning and at  bedtime. For UTI prevention   DULoxetine  (CYMBALTA ) 60 MG capsule Take 60 mg by mouth daily. Pain management   insulin  aspart (NOVOLOG  FLEXPEN) 100 UNIT/ML FlexPen Inject into the skin as directed. nsulin pen; 100 unit/mL (3 mL); amt: 7 units; subcutaneous With Meals 08:30 AM, 12:00 PM, 05:00 PM   Insulin  Glargine (BASAGLAR  KWIKPEN) 100 UNIT/ML Inject 17 Units into the skin at bedtime.   Insulin  Pen Needle (BD AUTOSHIELD DUO) 30G X 5 MM MISC by Does not apply route. 3/16   linagliptin  (TRADJENTA ) 5 MG TABS tablet Take 5 mg by mouth daily.   liver oil-zinc  oxide (DESITIN) 40 % ointment Apply 1 Application topically as directed. Every shift and prn Special Instructions: Apply small amount to left great toe per resident's request As Needed; PRN 1, PRN 2, PRN 3, PRN 4, PRN 5, PRN 6   losartan (COZAAR) 100 MG tablet Take 100 mg by mouth daily.   Melatonin 5 MG TABS Take 5 mg by mouth at bedtime.   meloxicam (MOBIC) 7.5 MG tablet Take 7.5 mg by mouth 2 (two) times daily.   mirabegron ER (MYRBETRIQ) 50 MG TB24 tablet Take 50 mg by mouth daily.   montelukast  (SINGULAIR ) 10 MG tablet Take 10 mg by mouth daily.   omeprazole (PRILOSEC) 20 MG capsule Take 20 mg by mouth daily.   polyethylene glycol (MIRALAX  / GLYCOLAX ) packet Take 17 g by mouth daily.   pregabalin  (LYRICA ) 25 MG capsule Take 25 mg by mouth 3 (three) times daily.   senna-docusate (SENOKOT-S) 8.6-50 MG tablet Take 1 tablet by mouth at bedtime.    sertraline  (ZOLOFT ) 25 MG tablet Take 25 mg by mouth daily.   simvastatin  (ZOCOR ) 10 MG tablet Take 10 mg by mouth at bedtime.    spironolactone (ALDACTONE) 25 MG tablet Take 25 mg by mouth daily.   triamcinolone  (NASACORT ) 55 MCG/ACT AERO nasal inhaler Place 1 spray into the nose daily.   valACYclovir  (VALTREX ) 500 MG tablet Take 500 mg by mouth daily.    No facility-administered encounter medications on file as of 12/04/2024.     SIGNIFICANT DIAGNOSTIC EXAMS  LABS REVIEWED PREVIOUS;      11-27-23: glucose 172; bun 16; creat 0.73; k+ 3.8; na++ 135; ca 8.6 gfr >60   03-18-24: wbc 10.8; hgb 11.1; hct 36.2; mcv 81.0 plt 258; glucose 173; bun 24; creat 1.13 k+ 4.7; na++ 130; ca 9.3; gfr 49; protein 7.2 albumin 3.8; hgb A1c 9.4   04-01-24: glucose 174; bun 21; creat 1.02;  k+ 4.4; na++ 132; ca 9.1; gfr 55 05-13-24: glucose 160; bun 22; creat 1.00; k+ 4.5; na++ 133; ca 9.1 gfr 56 06-03-24: glucose 124; bun 27; creat 1.19; k+ 4.6; na++ 129; ca 8.9 gfr 46 06-10-24: glucose 125; bun 28; creat 1.09; k+ 4.5; na++ 131; ca 9.0; gfr 51  07-01-24: hgb A1c 7.2 07-04-24: urine micro-albumin: 3.6  TODAY  09-26-24: wbc 11.6; hgb 10.5; hct 32.8; mcv 81.8 plt 218; glucose 146; bun 19; creat 1.03; k+ 4.4; na++ 130; ca 8.9; gfr 54; protein 6.8 albumin 4.0; hgb A1c 6.0; chol 118; ldl 42; trig 110; hdl 54 10-24-24: wbc 9.5; hgb 10.7; hct 33.9; mcv 82.7 plt 235; glucose 129; bun 26; creat 1.08; k+ 4.5; na++ 134; ca 8.9; gfr 51; protein 8.9 albumin 4.5; hgb a1c 7.5     Review of Systems  Constitutional:  Negative for malaise/fatigue.  Respiratory:  Negative for cough and shortness of breath.   Cardiovascular:  Negative for chest pain, palpitations and leg swelling.  Gastrointestinal:  Negative for abdominal pain, constipation and heartburn.  Musculoskeletal:  Negative for back pain, joint pain and myalgias.  Skin: Negative.   Neurological:  Negative for dizziness.  Psychiatric/Behavioral:  The patient is not nervous/anxious.     Physical Exam Constitutional:      General: She is not in acute distress.    Appearance: She is well-developed. She is obese. She is not diaphoretic.  Neck:     Thyroid : No thyromegaly.  Cardiovascular:     Rate and Rhythm: Normal rate and regular rhythm.     Heart sounds: Normal heart sounds.  Pulmonary:     Effort: Pulmonary effort is normal. No respiratory distress.     Breath sounds: Normal breath sounds.  Abdominal:     General: Bowel sounds are normal. There is no  distension.     Palpations: Abdomen is soft.     Tenderness: There is no abdominal tenderness.  Musculoskeletal:        General: Normal range of motion.     Cervical back: Neck supple.     Right lower leg: Edema present.     Left lower leg: Edema present.  Lymphadenopathy:     Cervical: No cervical adenopathy.  Skin:    General: Skin is warm and dry.  Neurological:     Mental Status: She is alert. Mental status is at baseline.     Comments:  SLUMS 6/30   Psychiatric:        Mood and Affect: Mood normal.         ASSESSMENT/ PLAN:  TODAY  Chronic non seasonal allergic rhinitis: will continue nasocort daily   2.  herpes: no recent outbreaks: will continue valtrex  500 mg daily   3. Vitamin B 12 deficiency level 806   PREVIOUS   4. Chronic constipation: will continue miralax  daily and senna s daily   5. Hypertension associated with type 2 diabetes mellitus: b/p 118/76 will continue norvasc  10 mg daily; coreg 6.25 mg twice daily and cozaar 100 mg daily aldactone 25 mg daily   6. Major depression recurrent chronic: will continue cymbalta  60 mg daily (failed one wean and also takes for pain management) will continue zoloft  25 mg daily;   7. CKD stage 3 due to type 2 diabetes mellitus: bun 26; creat 1.08; gfr 54  8. Major neurocognitive deficits: SLUMS 6/30  9. Normocytic anemia: hgb 10.0; hct 32.3  10. Aortic atherosclerosis (ct 12-18-19) is on asa and statin  11.  Dyslipidemia associated with type 2 diabetes mellitus: ldl 42 will continue zocor  10 mg daily   12. Type 2 diabetes mellitus with peripheral angiopathy: hgb A1c 7.5; will continue metformin  1 gm twice daily; tradjenta  5 mg daily; glargine 17 units nightly. Is on asa statin arb   13. Gastroesophageal reflux disease without esophagitis: will continue prilosec 20 mg daily did not tolerate being off this medication  14. OAB will continue myrbetriq 50 mg daily   15. Chronic generalized pain/left sciatic pain: will  continue tylenol  650 mg three times daily; lyrica  25 mg three times daily cymbalta  60 mg daily mobic 7.5 mg twice daily and has voltaren gel three times daily as needed.     Barnie Seip NP [facility]   call [phone]       [1]  Allergies Allergen Reactions   Gabapentin     Lisinopril    Penicillins Nausea Only    Has patient had a PCN reaction causing immediate rash, facial/tongue/throat swelling, SOB or lightheadedness with hypotension: Yes Has patient had a PCN reaction causing severe rash involving mucus membranes or skin necrosis: No Has patient had a PCN reaction that required hospitalization No Has patient had a PCN reaction occurring within the last 10 years: No If all of the above answers are NO, then may proceed with Cephalosporin use.    Sulfa Antibiotics Nausea And Vomiting   "

## 2024-12-06 ENCOUNTER — Other Ambulatory Visit (HOSPITAL_COMMUNITY)
Admission: RE | Admit: 2024-12-06 | Discharge: 2024-12-06 | Disposition: A | Discharge status: Home / Self Care | Source: Skilled Nursing Facility | Attending: Internal Medicine | Admitting: Internal Medicine

## 2024-12-06 DIAGNOSIS — J1000 Influenza due to other identified influenza virus with unspecified type of pneumonia: Secondary | ICD-10-CM | POA: Diagnosis present

## 2024-12-06 LAB — RESP PANEL BY RT-PCR (RSV, FLU A&B, COVID)  RVPGX2
Influenza A by PCR: NEGATIVE
Influenza B by PCR: NEGATIVE
Resp Syncytial Virus by PCR: NEGATIVE
SARS Coronavirus 2 by RT PCR: NEGATIVE

## 2024-12-24 ENCOUNTER — Non-Acute Institutional Stay (SKILLED_NURSING_FACILITY): Payer: Self-pay | Admitting: Internal Medicine

## 2024-12-24 ENCOUNTER — Encounter: Payer: Self-pay | Admitting: Internal Medicine

## 2024-12-24 DIAGNOSIS — I129 Hypertensive chronic kidney disease with stage 1 through stage 4 chronic kidney disease, or unspecified chronic kidney disease: Secondary | ICD-10-CM | POA: Diagnosis not present

## 2024-12-24 DIAGNOSIS — B372 Candidiasis of skin and nail: Secondary | ICD-10-CM | POA: Diagnosis not present

## 2024-12-24 DIAGNOSIS — Z7984 Long term (current) use of oral hypoglycemic drugs: Secondary | ICD-10-CM | POA: Diagnosis not present

## 2024-12-24 DIAGNOSIS — E1122 Type 2 diabetes mellitus with diabetic chronic kidney disease: Secondary | ICD-10-CM

## 2024-12-24 DIAGNOSIS — L299 Pruritus, unspecified: Secondary | ICD-10-CM | POA: Insufficient documentation

## 2024-12-24 DIAGNOSIS — N183 Chronic kidney disease, stage 3 unspecified: Secondary | ICD-10-CM

## 2024-12-24 NOTE — Assessment & Plan Note (Signed)
 I encouraged her to keep these areas as dry as possible.  Trial of nystatin powder twice daily x 7 days.

## 2024-12-24 NOTE — Patient Instructions (Signed)
 See assessment and plan under each diagnosis in the problem list and acutely for this visit

## 2024-12-24 NOTE — Assessment & Plan Note (Signed)
 Current A1c is 7.5% indicating adequate control in the context of advanced age and multiple comorbidities.  Glucose range at the SNF has been from 143 up to 252 with an average glucose of 174.  No change indicated; continue to monitor.  Blood pressure adequately controlled on multiple agents.  CKD stage IIIa is serially stable.  Med list reviewed; no definite indication for discontinuing any medications or decreasing dose despite polypharmacy.  Pregabalin  will be held to see if this is playing a role in her pruritus.

## 2024-12-24 NOTE — Assessment & Plan Note (Signed)
 Hold pregabalin  for 1 week.  Loratadine  10 mg daily.

## 2024-12-24 NOTE — Progress Notes (Unsigned)
 "   NURSING HOME LOCATION:  [facility] ROOM NUMBER: [id number]  CODE STATUS: Full code  PCP: Landy Barnie RAMAN, NP   This is a nursing facility follow up visit of chronic medical diagnoses to document compliance with Regulation 483.30 (c) in [employer] Manual Phase 2 which mandates caregiver visit ( visits can alternate among physician, PA or NP as per statutes) within 10 days of 30 days / 60 days/ 90 days post admission to SNF date  .  Interim medical record and care since last SNF visit was updated with review of diagnostic studies and change in clinical status since last visit were documented.  HPI: She is a permanent resident of this facility with medical diagnoses of history of bowel obstruction; diabetes with peripheral neuropathy; GERD; essential hypertension; dyslipidemia; sciatica; and history of urticaria.  Most recent labs were performed 10/24/2024.  Hyponatremia improved from prior value of 130 up to 134.  CKD stage IIIa was present with a creatinine 1.08 and GFR 51.  CKD has been stage IIIa for the last 10 months.  Normochromic, normocytic anemia was stable to improved with H/H of 10.7/33.9.  A1c was 7.5%, indicating adequate control based on advanced age and multiple comorbidities.  The range of glucoses here at the SNF have been from 143 up to 52 with an average of 174.  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 Staff reports that she has diffuse scabs on her legs and arms due to pruritus and scratching.  She uses a paper towel.  Stage bleeding.  She does have a history of urticaria.  Listed allergies include gabapentin , lisinopril, penicillin, and sulfa.  She is on ARV and also on pregabalin .  She can provide no meaningful history as to the nature of the adverse drug reaction or allergy.  She denied any angioedema reaction. She validates itchy watery eyes and sneezing.  She has no angioedema symptoms.  She also has itchy rash  under the breast. She describes numbness and tingling more in the left lower extremity than the right.  Constitutional: No fever, significant weight change, fatigue  Eyes: No redness, discharge, pain, vision change ENT/mouth: No nasal congestion,  purulent discharge, earache, change in hearing, sore throat  Cardiovascular: No chest pain, palpitations, paroxysmal nocturnal dyspnea, claudication, edema  Respiratory: No cough, sputum production, hemoptysis, DOE, significant snoring, apnea   Gastrointestinal: No heartburn, dysphagia, abdominal pain, nausea /vomiting, rectal bleeding, melena, change in bowels Genitourinary: No dysuria, hematuria, pyuria, incontinence, nocturia Musculoskeletal: No joint stiffness, joint swelling, weakness, pain Dermatologic: No rash, pruritus, change in appearance of skin Neurologic: No dizziness, headache, syncope, seizures, numbness, tingling Psychiatric: No significant anxiety, depression, insomnia, anorexia Endocrine: No change in hair/skin/nails, excessive thirst, excessive hunger, excessive urination  Hematologic/lymphatic: No significant bruising, lymphadenopathy, abnormal bleeding Allergy/immunology: No itchy/watery eyes, significant sneezing, urticaria, angioedema  Physical exam:  Pertinent or positive findings: As noted she is severely deaf.  Hyponasal speech pattern is present.  She appears to have only 2 lower teeth and is otherwise edentulous.  Grade 1 systolic murmur is present.  Candidal rash is present on the breast.  Abdomen is protuberant.  Pedal pulses are not palpable.  She appears to have trace edema at the right ankle.  The right great toenail is absent.  There are small round eschar lesions of the distal forearms and the left shin. General appearance: Adequately nourished; no acute distress, increased work of breathing is present.   Lymphatic: No  lymphadenopathy about the head, neck, axilla. Eyes: No conjunctival inflammation or lid edema is  present. There is no scleral icterus. Ears:  External ear exam shows no significant lesions or deformities.   Nose:  External nasal examination shows no deformity or inflammation. Nasal mucosa are pink and moist without lesions, exudates Oral exam:  Lips and gums are healthy appearing. There is no oropharyngeal erythema or exudate. Neck:  No thyromegaly, masses, tenderness noted.    Heart:  Normal rate and regular rhythm. S1 and S2 normal without gallop, murmur, click, rub .  Lungs: Chest clear to auscultation without wheezes, rhonchi, rales, rubs. Abdomen: Bowel sounds are normal. Abdomen is soft and nontender with no organomegaly, hernias, masses. GU: Deferred  Extremities:  No cyanosis, clubbing, edema  Neurologic exam : Cn 2-7 intact Strength equal  in upper & lower extremities Balance, Rhomberg, finger to nose testing could not be completed due to clinical state Deep tendon reflexes are equal Skin: Warm & dry w/o tenting. No significant lesions or rash.  See summary under each active problem in the Problem List with associated updated therapeutic plan  "

## 2025-01-03 ENCOUNTER — Non-Acute Institutional Stay (SKILLED_NURSING_FACILITY): Payer: Self-pay | Admitting: Internal Medicine

## 2025-01-03 ENCOUNTER — Encounter: Payer: Self-pay | Admitting: Internal Medicine

## 2025-01-03 DIAGNOSIS — L299 Pruritus, unspecified: Secondary | ICD-10-CM | POA: Diagnosis not present

## 2025-01-03 DIAGNOSIS — M5416 Radiculopathy, lumbar region: Secondary | ICD-10-CM | POA: Diagnosis not present

## 2025-01-03 NOTE — Progress Notes (Signed)
"  ° °  NURSING HOME LOCATION:  [facility] ROOM NUMBER:  [id number]  CODE STATUS: Full code  PCP: Landy Barnie RAMAN, NP   This is a nursing facility follow up visit for specific acute issue of left lower extremity pain which she describes as sciatica..  Interim medical record and care since last SNF visit was updated with review of diagnostic studies and change in clinical status since last visit were documented.  HPI: She states that the pain began 1/22 without any obvious predisposition or injury. This is in the context of a history of sciatica. Pain is described as constant and radiating to the left foot.  She is noted to be repetitively changing position in bed in an effort to relieve it. The resident had been seen 1/13 to assess pruritic lesions of LLE in the context of pregabalin  therapy.  As she has a history of unspecified intolerance or allergy to gabapentin , this was discontinued for 1 week as a trial to see if it were the etiology. Because of the pruritus ,she was repeatedly scratching, causing abrasions with associated risk of cellulitis. Apparently her daughter brought in a topical OTC antibiotic to use on the areas of pruritus.  The patient stopped this as it seemed to cause exfoliation of the eschar which was present. She was also treated for candidal dermatitis and inframammary areas.  Review of systems: She localizes the pain to the left flank.  She denies any associated GU symptoms.  There is also been no urinary or stool incontinence.  Physical exam:  Pertinent or positive findings: She is profoundly hard of hearing; the examiner must literally shout to be understood.  Straight leg raising results in exacerbation of the sciatic pain.  There was some fusiform enlargement of the knee without effusion.  Pedal pulses are not palpable.  She has multiple deep abrasions with minimal erythema circumferentially at the left shin.There is no associated clinical cellulitis or  purulence.  See summary under each active problem in the Problem List with associated updated therapeutic plan :  Lumbar radiculopathy Left lower extremity sciatic pain has exacerbated off pregabalin .  Pregabalin  had been held because of pruritus resulting in eschar formation over the left shin.  This is in the context of a history of intolerance or allergy to gabapentin .  The pruritus did not resolve off the pregabalin  and with addition of loratadine .  Another factor may be the that her daughter had brought in OTC antibiotic cream for the shin lesions.  The patient stopped this as it apparently resulted in denudation of the eschar. Pregabalin  will be restarted.  If symptoms persist or progress; consider referral to NS for possible repeat injection therapy.  Pruritus It appears the topical antibiotic brought in by her daughter may have caused denudation of the eschar.  The lesions are deeper and have minimal circumferential erythema without purulence or frank cellulitis.  Wound Care Nurse to monitor.  Clinical Key suggests topical capsaicin might be an option for the pruritus as it might be neuropathic in etiology; but the lesions will have to heal first.    "

## 2025-01-03 NOTE — Patient Instructions (Signed)
 See assessment and plan under each diagnosis in the problem list and acutely for this visit

## 2025-01-04 NOTE — Assessment & Plan Note (Addendum)
 Left lower extremity sciatic pain has exacerbated off pregabalin .  Pregabalin  had been held because of pruritus resulting in eschar formation over the left shin.  This is in the context of a history of intolerance or allergy to gabapentin .  The pruritus did not resolve off the pregabalin  and with addition of loratadine .  Another factor may be the that her daughter had brought in OTC antibiotic cream for the shin lesions.  The patient stopped this as it apparently resulted in denudation of the eschar. Pregabalin  will be restarted.  If symptoms persist or progress; consider referral to NS for possible repeat injection therapy.

## 2025-01-04 NOTE — Assessment & Plan Note (Addendum)
 It appears the topical antibiotic brought in by her daughter may have caused denudation of the eschar.  The lesions are deeper and have minimal circumferential erythema without purulence or frank cellulitis.  Wound Care Nurse to monitor.  Clinical Key suggests topical capsaicin might be an option for the pruritus as it might be neuropathic in etiology; but the lesions will have to heal first.
